# Patient Record
Sex: Female | Born: 1955 | Race: White | Hispanic: No | Smoking: Former smoker
Health system: Southern US, Community
[De-identification: ages and names within clinical notes are randomized; demographics above are authoritative.]

## PROBLEM LIST (undated history)

## (undated) ENCOUNTER — Emergency Department: Source: Home / Self Care

## (undated) DIAGNOSIS — E039 Hypothyroidism, unspecified: Secondary | ICD-10-CM

## (undated) DIAGNOSIS — I82409 Acute embolism and thrombosis of unspecified deep veins of unspecified lower extremity: Secondary | ICD-10-CM

## (undated) DIAGNOSIS — F329 Major depressive disorder, single episode, unspecified: Secondary | ICD-10-CM

## (undated) DIAGNOSIS — E0500 Thyrotoxicosis with diffuse goiter without thyrotoxic crisis or storm: Secondary | ICD-10-CM

## (undated) DIAGNOSIS — M810 Age-related osteoporosis without current pathological fracture: Secondary | ICD-10-CM

## (undated) DIAGNOSIS — F32A Depression, unspecified: Secondary | ICD-10-CM

## (undated) DIAGNOSIS — K219 Gastro-esophageal reflux disease without esophagitis: Secondary | ICD-10-CM

## (undated) DIAGNOSIS — M797 Fibromyalgia: Secondary | ICD-10-CM

## (undated) DIAGNOSIS — M5136 Other intervertebral disc degeneration, lumbar region: Secondary | ICD-10-CM

## (undated) DIAGNOSIS — M51369 Other intervertebral disc degeneration, lumbar region without mention of lumbar back pain or lower extremity pain: Secondary | ICD-10-CM

## (undated) DIAGNOSIS — J449 Chronic obstructive pulmonary disease, unspecified: Secondary | ICD-10-CM

## (undated) DIAGNOSIS — E785 Hyperlipidemia, unspecified: Secondary | ICD-10-CM

## (undated) DIAGNOSIS — F0390 Unspecified dementia without behavioral disturbance: Secondary | ICD-10-CM

## (undated) DIAGNOSIS — F419 Anxiety disorder, unspecified: Secondary | ICD-10-CM

## (undated) DIAGNOSIS — M329 Systemic lupus erythematosus, unspecified: Secondary | ICD-10-CM

## (undated) DIAGNOSIS — I10 Essential (primary) hypertension: Secondary | ICD-10-CM

## (undated) DIAGNOSIS — M359 Systemic involvement of connective tissue, unspecified: Secondary | ICD-10-CM

## (undated) DIAGNOSIS — M1990 Unspecified osteoarthritis, unspecified site: Secondary | ICD-10-CM

## (undated) DIAGNOSIS — IMO0002 Reserved for concepts with insufficient information to code with codable children: Secondary | ICD-10-CM

## (undated) DIAGNOSIS — E119 Type 2 diabetes mellitus without complications: Secondary | ICD-10-CM

## (undated) HISTORY — DX: Unspecified osteoarthritis, unspecified site: M19.90

## (undated) HISTORY — DX: Type 2 diabetes mellitus without complications: E11.9

## (undated) HISTORY — DX: Age-related osteoporosis without current pathological fracture: M81.0

## (undated) HISTORY — PX: ABDOMINAL HYSTERECTOMY: SHX81

## (undated) HISTORY — DX: Fibromyalgia: M79.7

## (undated) HISTORY — DX: Major depressive disorder, single episode, unspecified: F32.9

## (undated) HISTORY — DX: Chronic obstructive pulmonary disease, unspecified: J44.9

## (undated) HISTORY — DX: Essential (primary) hypertension: I10

## (undated) HISTORY — PX: EYE SURGERY: SHX253

## (undated) HISTORY — PX: OOPHORECTOMY: SHX86

## (undated) HISTORY — DX: Reserved for concepts with insufficient information to code with codable children: IMO0002

## (undated) HISTORY — DX: Depression, unspecified: F32.A

## (undated) HISTORY — DX: Systemic lupus erythematosus, unspecified: M32.9

---

## 2005-02-13 ENCOUNTER — Emergency Department: Payer: Self-pay | Admitting: Emergency Medicine

## 2007-07-21 ENCOUNTER — Ambulatory Visit: Payer: Self-pay

## 2007-07-26 ENCOUNTER — Emergency Department: Payer: Self-pay | Admitting: Emergency Medicine

## 2008-11-22 ENCOUNTER — Ambulatory Visit: Payer: Self-pay

## 2011-03-18 ENCOUNTER — Emergency Department: Payer: Self-pay | Admitting: Emergency Medicine

## 2015-01-15 ENCOUNTER — Ambulatory Visit: Payer: Self-pay | Admitting: Family Medicine

## 2015-01-25 DIAGNOSIS — R42 Dizziness and giddiness: Secondary | ICD-10-CM | POA: Insufficient documentation

## 2015-01-25 DIAGNOSIS — E119 Type 2 diabetes mellitus without complications: Secondary | ICD-10-CM | POA: Insufficient documentation

## 2015-01-25 DIAGNOSIS — Z86718 Personal history of other venous thrombosis and embolism: Secondary | ICD-10-CM | POA: Insufficient documentation

## 2015-01-31 DIAGNOSIS — Z7901 Long term (current) use of anticoagulants: Secondary | ICD-10-CM | POA: Insufficient documentation

## 2016-01-31 ENCOUNTER — Ambulatory Visit: Admit: 2016-01-31 | Payer: Self-pay | Admitting: Gastroenterology

## 2016-01-31 SURGERY — COLONOSCOPY WITH PROPOFOL
Anesthesia: General

## 2016-06-12 ENCOUNTER — Other Ambulatory Visit: Payer: Self-pay | Admitting: Family Medicine

## 2016-06-12 DIAGNOSIS — Z1231 Encounter for screening mammogram for malignant neoplasm of breast: Secondary | ICD-10-CM

## 2016-07-25 ENCOUNTER — Encounter (HOSPITAL_COMMUNITY): Payer: Self-pay

## 2016-07-25 ENCOUNTER — Ambulatory Visit
Admission: RE | Admit: 2016-07-25 | Discharge: 2016-07-25 | Disposition: A | Discharge status: Home / Self Care | Payer: Medicaid Other | Source: Ambulatory Visit | Attending: Family Medicine | Admitting: Family Medicine

## 2016-07-25 DIAGNOSIS — Z1231 Encounter for screening mammogram for malignant neoplasm of breast: Secondary | ICD-10-CM | POA: Insufficient documentation

## 2016-12-16 DIAGNOSIS — M159 Polyosteoarthritis, unspecified: Secondary | ICD-10-CM | POA: Insufficient documentation

## 2016-12-16 DIAGNOSIS — R634 Abnormal weight loss: Secondary | ICD-10-CM | POA: Insufficient documentation

## 2016-12-16 DIAGNOSIS — M5136 Other intervertebral disc degeneration, lumbar region: Secondary | ICD-10-CM | POA: Insufficient documentation

## 2016-12-29 ENCOUNTER — Ambulatory Visit: Payer: Medicaid Other | Attending: Internal Medicine | Admitting: Occupational Therapy

## 2016-12-29 ENCOUNTER — Encounter: Payer: Self-pay | Admitting: Occupational Therapy

## 2016-12-29 DIAGNOSIS — M25642 Stiffness of left hand, not elsewhere classified: Secondary | ICD-10-CM | POA: Diagnosis not present

## 2016-12-29 DIAGNOSIS — M25641 Stiffness of right hand, not elsewhere classified: Secondary | ICD-10-CM | POA: Insufficient documentation

## 2016-12-29 DIAGNOSIS — R208 Other disturbances of skin sensation: Secondary | ICD-10-CM | POA: Diagnosis present

## 2016-12-29 DIAGNOSIS — M6281 Muscle weakness (generalized): Secondary | ICD-10-CM | POA: Diagnosis present

## 2016-12-29 NOTE — Patient Instructions (Signed)
Heat  AROM tendon glides  Opposition AROM  RD of digits  2 x day 10 reps  pain free   Wear compression gloves as needed  Wrist splint for night time on R - and if needed during day for numbness  Ed on joint protection principles, AE  Modifying sleeping position

## 2016-12-29 NOTE — Therapy (Signed)
[facility] [address] [place], [address] Phone: [phone]   Fax:  [phone]  Occupational Therapy Treatment  Patient Details  Name: Alison Lopez MRN: [id number] Date of Birth: October 13, 1956 Referring Provider: Meda Coffee   Encounter Date: 12/29/2016      OT End of Session - 12/29/16 1518    Visit Number 1   Number of Visits 1   Date for OT Re-Evaluation 12/29/16   OT Start Time 1302   OT Stop Time 1400   OT Time Calculation (min) 58 min   Activity Tolerance Patient tolerated treatment well   Behavior During Therapy [facility] for tasks assessed/performed      Past Medical History:  Diagnosis Date  . Arthritis   . COPD (chronic obstructive pulmonary disease) ([place])   . Depression   . Diabetes mellitus without complication ([place])   . Fibromyalgia   . Hypertension   . Lupus     No past surgical history on file.  There were no vitals filed for this visit.      Subjective Assessment - 12/29/16 1305    Subjective  Hands gradually worse , drop objects - achiness in hands , feels numbness and wake up in the am - hands stay cold and  stiffness - hard time cutting food , writing , buttons , and doing things around the house   Patient Stated Goals Want to get the strength , exercises to do at home - do not want my hands getting worse    Currently in Pain? No/denies            [facility] OT Assessment - 12/29/16 0001      Assessment   Diagnosis OA of hands    Referring Provider Meda Coffee    Onset Date 12/16/16     Balance Screen   Has the patient fallen in the past 6 months Yes   How many times? 1   Has the patient had a decrease in activity level because of a fear of falling?  No   Is the patient reluctant to leave their home because of a fear of falling?  No     Home  Environment   Lives With Alone     Prior Function   Vocation On disability   Leisure R hand dominant , likes to read, play with dog  and bird, do own house work and cooking, play games on tablet      Strength   Right Hand Grip (lbs) 20   Right Hand Lateral Pinch 6 lbs   Right Hand 3 Point Pinch 5 lbs   Left Hand Grip (lbs) 15   Left Hand Lateral Pinch 10 lbs   Left Hand 3 Point Pinch 6 lbs     Right Hand AROM   R Little PIP 0-100 90 Degrees     Left Hand AROM   L Little PIP 0-100 90 Degrees       Heat  AROM tendon glides  Opposition AROM  RD of digits  2 x day 10 reps  pain free   Wear compression gloves as needed  Wrist splint for night time on R - and if needed during day for numbness  Ed on joint protection principles, AE  Modifying sleeping position                       OT Education - 12/29/16 1518    Education provided  Yes   Education Details HEP and modifications for tasks and sleeing - joint protection - glove nad splint wearing   Person(s) Educated Patient   Methods Explanation;Demonstration;Tactile cues;Verbal cues;Handout   Comprehension Verbal cues required;Returned demonstration;Verbalized understanding             OT Long Term Goals - 12/29/16 1527      OT LONG TERM GOAL #1   Title Pt to be ind in donning and doffing isotoner gloves, R wrist splint and HEP for ROM and joint protection    Baseline demo and verbalize understanding    Status Achieved               Plan - 12/29/16 1519    Clinical Impression Statement Pt present with bilateral hand stiffness and pain - with some numbness during day with tasks and at night time - pt positive for CT with Phalens test, and report numbness at night time and in 5th digit - changed her sleepoing position to fetal posiion few months ago because of hands staying cold and numb - pt AROM was WNL expect 5th PIP's - also show some UD drift in digits - decrease grip and prehension - pt was fitted with bilateral isotoner gloves , wrist splint for night time on R hand ,  educated on joint protection and modifications for  task and sleeping - pt  will cont with home program at home     OT Frequency One time visit   Plan pt to cont with Home program - medicaid to not authorize treatment sessions    OT Home Exercise Plan see pt instruction   Consulted and Agree with Plan of Care Patient      Patient will benefit from skilled therapeutic intervention in order to improve the following deficits and impairments:     Visit Diagnosis: Stiffness of left hand, not elsewhere classified - Plan: Ot plan of care cert/re-cert  Stiffness of right hand, not elsewhere classified - Plan: Ot plan of care cert/re-cert  Muscle weakness (generalized) - Plan: Ot plan of care cert/re-cert  Other disturbances of skin sensation - Plan: Ot plan of care cert/re-cert    Problem List There are no active problems to display for this patient.   Rosalyn Gess OTR/L,CLT 12/29/2016, 3:40 PM  [facility] [address], [place], [address] Phone: [phone]   Fax:  [phone]  Name: Alison Lopez MRN: [id number] Date of Birth: 02-Mar-1956

## 2017-01-28 DIAGNOSIS — M810 Age-related osteoporosis without current pathological fracture: Secondary | ICD-10-CM | POA: Insufficient documentation

## 2017-01-28 DIAGNOSIS — S32020A Wedge compression fracture of second lumbar vertebra, initial encounter for closed fracture: Secondary | ICD-10-CM | POA: Insufficient documentation

## 2017-01-28 DIAGNOSIS — M25559 Pain in unspecified hip: Secondary | ICD-10-CM | POA: Insufficient documentation

## 2017-01-29 ENCOUNTER — Other Ambulatory Visit: Payer: Self-pay | Admitting: Internal Medicine

## 2017-01-29 DIAGNOSIS — M5136 Other intervertebral disc degeneration, lumbar region: Secondary | ICD-10-CM

## 2017-01-29 DIAGNOSIS — M51369 Other intervertebral disc degeneration, lumbar region without mention of lumbar back pain or lower extremity pain: Secondary | ICD-10-CM

## 2017-02-09 ENCOUNTER — Encounter: Payer: Self-pay | Admitting: Radiology

## 2017-02-09 ENCOUNTER — Ambulatory Visit
Admission: RE | Admit: 2017-02-09 | Discharge: 2017-02-09 | Disposition: A | Discharge status: Home / Self Care | Payer: Medicaid Other | Source: Ambulatory Visit | Attending: Internal Medicine | Admitting: Internal Medicine

## 2017-02-09 DIAGNOSIS — M5126 Other intervertebral disc displacement, lumbar region: Secondary | ICD-10-CM | POA: Insufficient documentation

## 2017-02-09 DIAGNOSIS — M5136 Other intervertebral disc degeneration, lumbar region: Secondary | ICD-10-CM | POA: Insufficient documentation

## 2017-02-09 DIAGNOSIS — M48061 Spinal stenosis, lumbar region without neurogenic claudication: Secondary | ICD-10-CM | POA: Diagnosis not present

## 2017-02-09 DIAGNOSIS — R600 Localized edema: Secondary | ICD-10-CM | POA: Insufficient documentation

## 2017-02-09 DIAGNOSIS — M1288 Other specific arthropathies, not elsewhere classified, other specified site: Secondary | ICD-10-CM | POA: Insufficient documentation

## 2017-02-09 DIAGNOSIS — M7138 Other bursal cyst, other site: Secondary | ICD-10-CM | POA: Insufficient documentation

## 2017-08-04 ENCOUNTER — Other Ambulatory Visit: Payer: Self-pay | Admitting: Gastroenterology

## 2017-08-04 DIAGNOSIS — K219 Gastro-esophageal reflux disease without esophagitis: Secondary | ICD-10-CM

## 2017-08-28 ENCOUNTER — Other Ambulatory Visit: Payer: Self-pay | Admitting: Family Medicine

## 2017-08-31 ENCOUNTER — Other Ambulatory Visit: Payer: Self-pay | Admitting: Family Medicine

## 2017-08-31 DIAGNOSIS — Z1231 Encounter for screening mammogram for malignant neoplasm of breast: Secondary | ICD-10-CM

## 2018-06-29 ENCOUNTER — Other Ambulatory Visit: Payer: Self-pay | Admitting: Family Medicine

## 2018-06-29 DIAGNOSIS — Z1231 Encounter for screening mammogram for malignant neoplasm of breast: Secondary | ICD-10-CM

## 2018-07-09 ENCOUNTER — Other Ambulatory Visit: Payer: Self-pay | Admitting: Specialist

## 2018-07-09 DIAGNOSIS — J849 Interstitial pulmonary disease, unspecified: Secondary | ICD-10-CM

## 2018-07-13 ENCOUNTER — Ambulatory Visit
Admission: RE | Admit: 2018-07-13 | Discharge: 2018-07-13 | Disposition: A | Discharge status: Home / Self Care | Payer: Medicaid Other | Source: Ambulatory Visit | Attending: Family Medicine | Admitting: Family Medicine

## 2018-07-13 DIAGNOSIS — Z1231 Encounter for screening mammogram for malignant neoplasm of breast: Secondary | ICD-10-CM | POA: Insufficient documentation

## 2018-07-14 ENCOUNTER — Other Ambulatory Visit: Payer: Self-pay | Admitting: Family Medicine

## 2018-07-14 DIAGNOSIS — N6452 Nipple discharge: Secondary | ICD-10-CM

## 2018-08-02 ENCOUNTER — Ambulatory Visit: Payer: Medicaid Other

## 2018-08-04 ENCOUNTER — Ambulatory Visit
Admission: RE | Admit: 2018-08-04 | Discharge: 2018-08-04 | Disposition: A | Discharge status: Home / Self Care | Payer: Medicaid Other | Source: Ambulatory Visit | Attending: Family Medicine | Admitting: Family Medicine

## 2018-08-04 DIAGNOSIS — N6452 Nipple discharge: Secondary | ICD-10-CM

## 2019-03-21 DIAGNOSIS — M79604 Pain in right leg: Secondary | ICD-10-CM | POA: Insufficient documentation

## 2019-04-21 ENCOUNTER — Emergency Department: Payer: Medicaid Other

## 2019-04-21 ENCOUNTER — Inpatient Hospital Stay
Admission: EM | Admit: 2019-04-21 | Discharge: 2019-04-25 | DRG: 271 | Disposition: A | Discharge status: Home / Self Care | Payer: Medicaid Other | Attending: Internal Medicine | Admitting: Internal Medicine

## 2019-04-21 ENCOUNTER — Other Ambulatory Visit: Payer: Self-pay

## 2019-04-21 ENCOUNTER — Encounter: Payer: Self-pay | Admitting: *Deleted

## 2019-04-21 DIAGNOSIS — Z90710 Acquired absence of both cervix and uterus: Secondary | ICD-10-CM

## 2019-04-21 DIAGNOSIS — I745 Embolism and thrombosis of iliac artery: Secondary | ICD-10-CM | POA: Diagnosis present

## 2019-04-21 DIAGNOSIS — M797 Fibromyalgia: Secondary | ICD-10-CM | POA: Diagnosis present

## 2019-04-21 DIAGNOSIS — D62 Acute posthemorrhagic anemia: Secondary | ICD-10-CM | POA: Diagnosis not present

## 2019-04-21 DIAGNOSIS — J449 Chronic obstructive pulmonary disease, unspecified: Secondary | ICD-10-CM | POA: Diagnosis present

## 2019-04-21 DIAGNOSIS — I82409 Acute embolism and thrombosis of unspecified deep veins of unspecified lower extremity: Secondary | ICD-10-CM | POA: Diagnosis present

## 2019-04-21 DIAGNOSIS — Z7951 Long term (current) use of inhaled steroids: Secondary | ICD-10-CM | POA: Diagnosis not present

## 2019-04-21 DIAGNOSIS — I82491 Acute embolism and thrombosis of other specified deep vein of right lower extremity: Secondary | ICD-10-CM | POA: Diagnosis not present

## 2019-04-21 DIAGNOSIS — I82401 Acute embolism and thrombosis of unspecified deep veins of right lower extremity: Secondary | ICD-10-CM

## 2019-04-21 DIAGNOSIS — Z7984 Long term (current) use of oral hypoglycemic drugs: Secondary | ICD-10-CM

## 2019-04-21 DIAGNOSIS — I82431 Acute embolism and thrombosis of right popliteal vein: Secondary | ICD-10-CM | POA: Diagnosis present

## 2019-04-21 DIAGNOSIS — I82411 Acute embolism and thrombosis of right femoral vein: Secondary | ICD-10-CM | POA: Diagnosis present

## 2019-04-21 DIAGNOSIS — E039 Hypothyroidism, unspecified: Secondary | ICD-10-CM | POA: Diagnosis present

## 2019-04-21 DIAGNOSIS — E1151 Type 2 diabetes mellitus with diabetic peripheral angiopathy without gangrene: Secondary | ICD-10-CM | POA: Diagnosis present

## 2019-04-21 DIAGNOSIS — Z87891 Personal history of nicotine dependence: Secondary | ICD-10-CM

## 2019-04-21 DIAGNOSIS — Z1159 Encounter for screening for other viral diseases: Secondary | ICD-10-CM | POA: Diagnosis not present

## 2019-04-21 DIAGNOSIS — D638 Anemia in other chronic diseases classified elsewhere: Secondary | ICD-10-CM | POA: Diagnosis present

## 2019-04-21 DIAGNOSIS — Z86718 Personal history of other venous thrombosis and embolism: Secondary | ICD-10-CM

## 2019-04-21 DIAGNOSIS — I10 Essential (primary) hypertension: Secondary | ICD-10-CM | POA: Diagnosis present

## 2019-04-21 DIAGNOSIS — Z79890 Hormone replacement therapy (postmenopausal): Secondary | ICD-10-CM | POA: Diagnosis not present

## 2019-04-21 DIAGNOSIS — E785 Hyperlipidemia, unspecified: Secondary | ICD-10-CM | POA: Diagnosis present

## 2019-04-21 DIAGNOSIS — I82421 Acute embolism and thrombosis of right iliac vein: Secondary | ICD-10-CM | POA: Diagnosis not present

## 2019-04-21 DIAGNOSIS — Z79899 Other long term (current) drug therapy: Secondary | ICD-10-CM

## 2019-04-21 DIAGNOSIS — Z7901 Long term (current) use of anticoagulants: Secondary | ICD-10-CM

## 2019-04-21 DIAGNOSIS — E119 Type 2 diabetes mellitus without complications: Secondary | ICD-10-CM | POA: Diagnosis not present

## 2019-04-21 DIAGNOSIS — M79661 Pain in right lower leg: Secondary | ICD-10-CM | POA: Diagnosis present

## 2019-04-21 HISTORY — DX: Acute embolism and thrombosis of unspecified deep veins of unspecified lower extremity: I82.409

## 2019-04-21 LAB — BASIC METABOLIC PANEL
Anion gap: 10 (ref 5–15)
BUN: 15 mg/dL (ref 8–23)
CO2: 20 mmol/L — ABNORMAL LOW (ref 22–32)
Calcium: 8.7 mg/dL — ABNORMAL LOW (ref 8.9–10.3)
Chloride: 105 mmol/L (ref 98–111)
Creatinine, Ser: 1.06 mg/dL — ABNORMAL HIGH (ref 0.44–1.00)
GFR calc Af Amer: >60 mL/min (ref >60)
GFR calc non Af Amer: 55 mL/min — ABNORMAL LOW (ref >60)
Glucose, Bld: 87 mg/dL (ref 70–99)
Potassium: 3.3 mmol/L — ABNORMAL LOW (ref 3.5–5.1)
Sodium: 135 mmol/L (ref 135–145)

## 2019-04-21 LAB — CBC
HCT: 36.3 % (ref 36.0–46.0)
Hemoglobin: 11 g/dL — ABNORMAL LOW (ref 12.0–15.0)
MCH: 24.6 pg — ABNORMAL LOW (ref 26.0–34.0)
MCHC: 30.3 g/dL (ref 30.0–36.0)
MCV: 81 fL (ref 80.0–100.0)
Platelets: 94 10*3/uL — ABNORMAL LOW (ref 150–400)
RBC: 4.48 MIL/uL (ref 3.87–5.11)
RDW: 16.2 % — ABNORMAL HIGH (ref 11.5–15.5)
WBC: 11.3 10*3/uL — ABNORMAL HIGH (ref 4.0–10.5)
nRBC: 0 % (ref 0.0–0.2)

## 2019-04-21 LAB — PROTIME-INR
INR: 2.2 — ABNORMAL HIGH (ref 0.8–1.2)
Prothrombin Time: 24.3 seconds — ABNORMAL HIGH (ref 11.4–15.2)

## 2019-04-21 LAB — SURGICAL PCR SCREEN
MRSA, PCR: NEGATIVE
Staphylococcus aureus: NEGATIVE

## 2019-04-21 LAB — SARS CORONAVIRUS 2 BY RT PCR (HOSPITAL ORDER, PERFORMED IN ~~LOC~~ HOSPITAL LAB): SARS Coronavirus 2: NEGATIVE

## 2019-04-21 LAB — GLUCOSE, CAPILLARY: Glucose-Capillary: 83 mg/dL (ref 70–99)

## 2019-04-21 LAB — APTT: aPTT: 45 seconds — ABNORMAL HIGH (ref 24–36)

## 2019-04-21 MED ORDER — CYCLOBENZAPRINE HCL 10 MG PO TABS: 5.0000 mg | ORAL_TABLET | Freq: Three times a day (TID) | ORAL | Status: DC | PRN

## 2019-04-21 MED ORDER — ONDANSETRON HCL 4 MG/2ML IJ SOLN: 4.0000 mg | Freq: Four times a day (QID) | INTRAMUSCULAR | Status: DC | PRN

## 2019-04-21 MED ORDER — MOMETASONE FURO-FORMOTEROL FUM 200-5 MCG/ACT IN AERO
2.0000 | INHALATION_SPRAY | Freq: Two times a day (BID) | RESPIRATORY_TRACT | Status: DC
  Administered 2019-04-21 – 2019-04-25 (×8): 2 via RESPIRATORY_TRACT
  Filled 2019-04-21: qty 8.8

## 2019-04-21 MED ORDER — SODIUM CHLORIDE 0.9 % IV SOLN: 250.0000 mL | INTRAVENOUS | Status: DC | PRN

## 2019-04-21 MED ORDER — SIMVASTATIN 40 MG PO TABS
40.0000 mg | ORAL_TABLET | Freq: Every day | ORAL | Status: DC
  Administered 2019-04-21 – 2019-04-24 (×4): 40 mg via ORAL
  Filled 2019-04-21 (×5): qty 1

## 2019-04-21 MED ORDER — INSULIN ASPART 100 UNIT/ML ~~LOC~~ SOLN: 0.0000 [IU] | Freq: Every day | SUBCUTANEOUS | Status: DC

## 2019-04-21 MED ORDER — ONDANSETRON HCL 4 MG PO TABS: 4.0000 mg | ORAL_TABLET | Freq: Four times a day (QID) | ORAL | Status: DC | PRN

## 2019-04-21 MED ORDER — HEPARIN BOLUS VIA INFUSION
4000.0000 [IU] | Freq: Once | INTRAVENOUS | Status: AC
  Administered 2019-04-21: 21:00:00 4000 [IU] via INTRAVENOUS
  Filled 2019-04-21: qty 4000

## 2019-04-21 MED ORDER — MUPIROCIN 2 % EX OINT
1.0000 "application " | TOPICAL_OINTMENT | Freq: Two times a day (BID) | CUTANEOUS | Status: DC
  Administered 2019-04-21: 1 via NASAL
  Filled 2019-04-21: qty 22

## 2019-04-21 MED ORDER — HEPARIN (PORCINE) 25000 UT/250ML-% IV SOLN
1000.0000 [IU]/h | INTRAVENOUS | Status: DC
  Administered 2019-04-21 – 2019-04-22 (×2): 1000 [IU]/h via INTRAVENOUS
  Filled 2019-04-21 (×2): qty 250

## 2019-04-21 MED ORDER — HYDRALAZINE HCL 20 MG/ML IJ SOLN: 10.0000 mg | INTRAMUSCULAR | Status: DC | PRN

## 2019-04-21 MED ORDER — HYDROCODONE-ACETAMINOPHEN 5-325 MG PO TABS: 1.0000 | ORAL_TABLET | ORAL | Status: DC | PRN

## 2019-04-21 MED ORDER — LISINOPRIL 20 MG PO TABS
20.0000 mg | ORAL_TABLET | Freq: Every day | ORAL | Status: DC
  Administered 2019-04-22: 20 mg via ORAL
  Filled 2019-04-21: qty 2

## 2019-04-21 MED ORDER — BUPROPION HCL ER (XL) 150 MG PO TB24
150.0000 mg | ORAL_TABLET | Freq: Every day | ORAL | Status: DC
  Administered 2019-04-22 – 2019-04-25 (×4): 150 mg via ORAL
  Filled 2019-04-21 (×4): qty 1

## 2019-04-21 MED ORDER — POLYETHYLENE GLYCOL 3350 17 G PO PACK: 17.0000 g | PACK | Freq: Every day | ORAL | Status: DC | PRN

## 2019-04-21 MED ORDER — FLUTICASONE PROPIONATE 50 MCG/ACT NA SUSP
2.0000 | Freq: Every day | NASAL | Status: DC
  Administered 2019-04-22 – 2019-04-25 (×4): 2 via NASAL
  Filled 2019-04-21: qty 16

## 2019-04-21 MED ORDER — INSULIN ASPART 100 UNIT/ML ~~LOC~~ SOLN: 0.0000 [IU] | Freq: Three times a day (TID) | SUBCUTANEOUS | Status: DC

## 2019-04-21 MED ORDER — SODIUM CHLORIDE 0.9% FLUSH
3.0000 mL | Freq: Two times a day (BID) | INTRAVENOUS | Status: DC
  Administered 2019-04-22 – 2019-04-25 (×6): 3 mL via INTRAVENOUS

## 2019-04-21 MED ORDER — ALBUTEROL SULFATE (2.5 MG/3ML) 0.083% IN NEBU
2.5000 mg | INHALATION_SOLUTION | Freq: Four times a day (QID) | RESPIRATORY_TRACT | Status: DC
  Administered 2019-04-21 – 2019-04-23 (×7): 2.5 mg via RESPIRATORY_TRACT
  Filled 2019-04-21 (×7): qty 3

## 2019-04-21 MED ORDER — ACETAMINOPHEN 325 MG PO TABS: 650.0000 mg | ORAL_TABLET | Freq: Four times a day (QID) | ORAL | Status: DC | PRN

## 2019-04-21 MED ORDER — MONTELUKAST SODIUM 10 MG PO TABS
10.0000 mg | ORAL_TABLET | Freq: Every day | ORAL | Status: DC
  Filled 2019-04-21 (×3): qty 1

## 2019-04-21 MED ORDER — LEVOTHYROXINE SODIUM 88 MCG PO TABS
88.0000 ug | ORAL_TABLET | Freq: Every day | ORAL | Status: DC
  Administered 2019-04-22 – 2019-04-25 (×4): 88 ug via ORAL
  Filled 2019-04-21 (×4): qty 1

## 2019-04-21 MED ORDER — ACETAMINOPHEN 650 MG RE SUPP: 650.0000 mg | Freq: Four times a day (QID) | RECTAL | Status: DC | PRN

## 2019-04-21 MED ORDER — HEPARIN SODIUM (PORCINE) 5000 UNIT/ML IJ SOLN: 60.0000 [IU]/kg | Freq: Once | INTRAMUSCULAR | Status: DC

## 2019-04-21 MED ORDER — SODIUM CHLORIDE 0.9% FLUSH: 3.0000 mL | INTRAVENOUS | Status: DC | PRN

## 2019-04-21 MED ORDER — POTASSIUM CHLORIDE 20 MEQ PO PACK
40.0000 meq | PACK | Freq: Once | ORAL | Status: AC
  Administered 2019-04-21: 40 meq via ORAL
  Filled 2019-04-21: qty 2

## 2019-04-21 MED ORDER — ALBUTEROL SULFATE (2.5 MG/3ML) 0.083% IN NEBU: 2.5000 mg | INHALATION_SOLUTION | Freq: Four times a day (QID) | RESPIRATORY_TRACT | Status: DC | PRN

## 2019-04-21 MED ORDER — PANTOPRAZOLE SODIUM 40 MG PO TBEC
40.0000 mg | DELAYED_RELEASE_TABLET | Freq: Every day | ORAL | Status: DC
  Administered 2019-04-22 – 2019-04-25 (×4): 40 mg via ORAL
  Filled 2019-04-21 (×4): qty 1

## 2019-04-21 NOTE — ED Provider Notes (Signed)
[facility] Emergency Department Provider Note  ____________________________________________   I have reviewed the triage vital signs and the nursing notes.   HISTORY  Chief Complaint Claudication   History limited by: Not Limited   HPI Alison Lopez is a 63 y.o. female who presents to the emergency department today because of concerns for right leg tightness and swelling.  Patient states that she has histories of DVTs in her left leg.  She states starting yesterday she noticed some tightness to her right leg.  She had been having some discomfort in the right leg for a few months.  She has noticed some swelling in that right leg for the past couple of days as well.  She denies any shortness of breath or chest pain.  She is on Coumadin and states she has not missed any doses.  Records reviewed. Per medical record review patient has a history of DVT  Past Medical History:  Diagnosis Date  . Arthritis   . COPD (chronic obstructive pulmonary disease) ([place])   . Depression   . Diabetes mellitus without complication ([place])   . DVT (deep venous thrombosis) ([place])   . Fibromyalgia   . Hypertension   . Lupus ([place])     There are no active problems to display for this patient.   Past Surgical History:  Procedure Laterality Date  . ABDOMINAL HYSTERECTOMY    . OOPHORECTOMY      Prior to Admission medications   Medication Sig Start Date End Date Taking? Authorizing Provider  albuterol (PROVENTIL) (2.5 MG/3ML) 0.083% nebulizer solution Take 2.5 mg by nebulization every 6 (six) hours as needed for wheezing or shortness of breath.    [provider]  buPROPion (WELLBUTRIN XL) 150 MG 24 hr tablet Take 150 mg by mouth daily.    [provider]  cyclobenzaprine (FLEXERIL) 5 MG tablet Take 5 mg by mouth 3 (three) times daily as needed for muscle spasms.    [provider]  esomeprazole (NEXIUM) 40 MG capsule Take 40 mg by mouth daily at 12  noon.    [provider]  fluticasone-salmeterol (ADVAIR HFA) 115-21 MCG/ACT inhaler Inhale 2 puffs into the lungs 2 (two) times daily.    [provider]  levothyroxine (SYNTHROID, LEVOTHROID) 88 MCG tablet Take 88 mcg by mouth daily before breakfast.    [provider]  lisinopril (PRINIVIL,ZESTRIL) 20 MG tablet Take 20 mg by mouth daily.    [provider]  metFORMIN (GLUCOPHAGE) 500 MG tablet Take by mouth 2 (two) times daily with a meal.    [provider]  mometasone (NASONEX) 50 MCG/ACT nasal spray Place 2 sprays into the nose daily.    [provider]  montelukast (SINGULAIR) 10 MG tablet Take 10 mg by mouth at bedtime.    [provider]  simvastatin (ZOCOR) 40 MG tablet Take 40 mg by mouth daily.    [provider]  warfarin (COUMADIN) 3 MG tablet Take 3 mg by mouth daily.    [provider]  warfarin (COUMADIN) 6 MG tablet Take 6 mg by mouth daily.    [provider]    Allergies Morphine and related, Aspirin, Benadryl [diphenhydramine hcl], and Codeine  Family History  Problem Relation Age of Onset  . Breast cancer Neg Hx     Social History Social History   Tobacco Use  . Smoking status: Former Research scientist (life sciences)  . Smokeless tobacco: Never Used  Substance Use Topics  . Alcohol use:  Not Currently  . Drug use: Never    Review of Systems Constitutional: No fever/chills Eyes: No visual changes. ENT: No sore throat. Cardiovascular: Denies chest pain. Respiratory: Denies shortness of breath. Gastrointestinal: No abdominal pain.  No nausea, no vomiting.  No diarrhea.   Genitourinary: Negative for dysuria. Musculoskeletal: Positive for left leg discomfort and swelling.  Skin: Negative for rash. Neurological: Negative for headaches, focal weakness or numbness.  ____________________________________________   PHYSICAL EXAM:  VITAL SIGNS: ED Triage Vitals  Enc Vitals Group     BP 04/21/19  1523 (!) 131/54     Pulse Rate 04/21/19 1523 89     Resp 04/21/19 1523 19     Temp 04/21/19 1523 99.1 F (37.3 C)     Temp Source 04/21/19 1523 Oral     SpO2 04/21/19 1513 98 %     Weight --      Height --      Head Circumference --      Peak Flow --      Pain Score 04/21/19 1524 0   Constitutional: Alert and oriented.  Eyes: Conjunctivae are normal.  ENT      Head: Normocephalic and atraumatic.      Nose: No congestion/rhinnorhea.      Mouth/Throat: Mucous membranes are moist.      Neck: No stridor. Hematological/Lymphatic/Immunilogical: No cervical lymphadenopathy. Cardiovascular: Normal rate, regular rhythm.  No murmurs, rubs, or gallops.  Respiratory: Normal respiratory effort without tachypnea nor retractions. Breath sounds are clear and equal bilaterally. No wheezes/rales/rhonchi. Gastrointestinal: Soft and non tender. No rebound. No guarding.  Genitourinary: Deferred Musculoskeletal: Swelling to right lower leg. Leg feels slightly colder to touch. DP palpable but faint compared to left leg.  Neurologic:  Normal speech and language. No gross focal neurologic deficits are appreciated.  Skin:  Erythema noted to right leg Psychiatric: Mood and affect are normal. Speech and behavior are normal. Patient exhibits appropriate insight and judgment.  ____________________________________________    LABS (pertinent positives/negatives)  INR 2.2 BMP na 135, k 3.3, co2 20, cr 1.06, ca 8.7 CBC wbc 11.3, hgb 11.0, plt 94 ____________________________________________   EKG  None  ____________________________________________    RADIOLOGY  RLE [place] Positive DVT in right calf, popliteal and femoral  ____________________________________________   PROCEDURES  Procedures  ____________________________________________   INITIAL IMPRESSION / ASSESSMENT AND PLAN / ED COURSE  Pertinent labs & imaging results that were available during my care of the patient were reviewed by  me and considered in my medical decision making (see chart for details).   Patient presented to the emergency department today because of concerns for right lower leg tightness and swelling.  On exam patient does have some swelling to that right lower leg.  Ultrasound which was ordered from triage was concerning for large DVT.  Discussed with Dr. Lucky Cowboy with vascular surgery.  Will plan on admission at this time. Discussed findings and plan with patient.   ___________________________________________   FINAL CLINICAL IMPRESSION(S) / ED DIAGNOSES  Final diagnoses:  Acute deep vein thrombosis (DVT) of other specified vein of right lower extremity ([place])     Note: This dictation was prepared with Dragon dictation. Any transcriptional errors that result from this process are unintentional     Nance Pear, MD 04/21/19 2046

## 2019-04-21 NOTE — ED Triage Notes (Signed)
Pt c/o R calf pain starting this morning. Pt has PMH of DVT and is presently anticoagulated w/ coumadin.

## 2019-04-21 NOTE — ED Notes (Signed)
ED TO INPATIENT HANDOFF REPORT  ED Nurse Name and Phone #: Joelene Millin [phone]  S Name/Age/Gender Acey Lav 63 y.o. female Room/Bed: ED26A/ED26A  Code Status   Code Status: Not on file  Home/SNF/Other Home Patient oriented to: self, place, time and situation Is this baseline? Yes   Triage Complete: Triage complete  Chief Complaint Leg pain  Triage Note Pt c/o R calf pain starting this morning. Pt has PMH of DVT and is presently anticoagulated w/ coumadin.    Allergies Allergies  Allergen Reactions  . Morphine And Related Anaphylaxis  . Aspirin   . Benadryl [Diphenhydramine Hcl]   . Codeine Other (See Comments)    AMS    Level of Care/Admitting Diagnosis ED Disposition    ED Disposition Condition [place]: [place] [100120]  Level of Care: Med-Surg [16]  Covid Evaluation: N/A  Diagnosis: DVT (deep venous thrombosis) [facility]) [962229]  Admitting Physician: Gorden Harms [[id number]]  Attending Physician: Gorden Harms [[id number]]  Estimated length of stay: past midnight tomorrow  Certification:: I certify this patient will need inpatient services for at least 2 midnights  PT Class (Do Not Modify): Inpatient [101]  PT Acc Code (Do Not Modify): Private [1]       B Medical/Surgery History Past Medical History:  Diagnosis Date  . Arthritis   . COPD (chronic obstructive pulmonary disease) ([place])   . Depression   . Diabetes mellitus without complication ([place])   . DVT (deep venous thrombosis) ([place])   . Fibromyalgia   . Hypertension   . Lupus [facility])    Past Surgical History:  Procedure Laterality Date  . ABDOMINAL HYSTERECTOMY    . OOPHORECTOMY       A IV Location/Drains/Wounds Patient Lines/Drains/Airways Status   Active Line/Drains/Airways    None          Intake/Output Last 24 hours No intake or output data in the 24 hours ending 04/21/19 2046  Labs/Imaging Results for orders placed or  performed during the hospital encounter of 04/21/19 (from the past 48 hour(s))  CBC     Status: Abnormal   Collection Time: 04/21/19  5:07 PM  Result Value Ref Range   WBC 11.3 (H) 4.0 - 10.5 K/uL   RBC 4.48 3.87 - 5.11 MIL/uL   Hemoglobin 11.0 (L) 12.0 - 15.0 g/dL   HCT 36.3 36.0 - 46.0 %   MCV 81.0 80.0 - 100.0 fL   MCH 24.6 (L) 26.0 - 34.0 pg   MCHC 30.3 30.0 - 36.0 g/dL   RDW 16.2 (H) 11.5 - 15.5 %   Platelets 94 (L) 150 - 400 K/uL    Comment: Immature Platelet Fraction may be clinically indicated, consider ordering this additional test [id number]    nRBC 0.0 0.0 - 0.2 %    Comment: Performed at [facility], [address] [place], [place] [address]  Basic metabolic panel     Status: Abnormal   Collection Time: 04/21/19  5:07 PM  Result Value Ref Range   Sodium 135 135 - 145 mmol/L   Potassium 3.3 (L) 3.5 - 5.1 mmol/L   Chloride 105 98 - 111 mmol/L   CO2 20 (L) 22 - 32 mmol/L   Glucose, Bld 87 70 - 99 mg/dL   BUN 15 8 - 23 mg/dL   Creatinine, Ser 1.06 (H) 0.44 - 1.00 mg/dL   Calcium 8.7 (L) 8.9 - 10.3 mg/dL   GFR calc non Af Wyvonnia Lora  55 (L) >60 mL/min   GFR calc Af Amer >60 >60 mL/min   Anion gap 10 5 - 15    Comment: Performed at [facility], [address] [place], [place] [address]  Protime-INR     Status: Abnormal   Collection Time: 04/21/19  5:11 PM  Result Value Ref Range   Prothrombin Time 24.3 (H) 11.4 - 15.2 seconds   INR 2.2 (H) 0.8 - 1.2    Comment: (NOTE) INR goal varies based on device and disease states. Performed at [facility], [place], [place] [address]    US Venous Img Lower Unilateral Right  Result Date: 04/21/2019 CLINICAL DATA:  Calf pain x1 day EXAM: RIGHT LOWER EXTREMITY VENOUS DOPPLER ULTRASOUND TECHNIQUE: Gray-scale sonography with compression, as well as color and duplex ultrasound, were performed to evaluate the deep venous system from the level of the common femoral vein through the popliteal  and proximal calf veins. COMPARISON:  None FINDINGS: Hypoechoic thrombus in common femoral vein involving saphenofemoral junction, extending into the femoral vein and deep femoral vein with very limited flow on color Doppler. The thrombus extends into the popliteal vein and is seen in posterior and peroneal veins, where it is incompletely occlusive. Survey views of the contralateral common femoral vein are unremarkable. IMPRESSION: 1. POSITIVE for extensive right calf, popliteal and femoral DVT, largely occlusive. Electronically Signed   By: Lucrezia Europe M.D.   On: 04/21/2019 16:52    Pending Labs Unresulted Labs (From admission, onward)    Start     Ordered   04/22/19 0500  Heparin level (unfractionated)  Tomorrow morning,   STAT     04/21/19 2006   04/22/19 0500  CBC  Tomorrow morning,   STAT     04/21/19 2010   04/21/19 2005  APTT  ONCE - STAT,   STAT     04/21/19 2006   04/21/19 1938  SARS Coronavirus 2 (CEPHEID- Performed in [facility] hospital lab), Hosp Order  (Symptomatic Patients Labs with Precautions )  Once,   STAT     04/21/19 1937   Signed and Held  HIV antibody (Routine Testing)  Once,   R     Signed and Held   Signed and Held  CBC  Tomorrow morning,   R     Signed and Held   Signed and Occupational hygienist morning,   R     Signed and Held   Signed and Held  Hemoglobin A1c  Once,   R    Comments: To assess prior glycemic control    Signed and Held   Signed and Held  Magnesium  Tomorrow morning,   R     Signed and Held          Vitals/Pain Today's Vitals   04/21/19 1951 04/21/19 2000 04/21/19 2015 04/21/19 2030  BP:  (!) 142/81  (!) 142/81  Pulse:  83 75 80  Resp:      Temp:      TempSrc:      SpO2:  97% 97% 100%  Weight: 71.2 kg     Height: 5' (1.524 m)     PainSc:        Isolation Precautions Droplet and Contact precautions  Medications Medications  heparin bolus via infusion 4,000 Units (has no administration in time range)  heparin  ADULT infusion 100 units/mL (25000 units/210mL sodium chloride 0.45%) (has no administration in time range)  Mobility walks with device Low fall risk   Focused Assessments DVT   R Recommendations: See Admitting Provider Note  Report given to:   Additional Notes:

## 2019-04-21 NOTE — H&P (Signed)
[place] [facility] [place] NAME: Alison Lopez    MR#:  [id number]  DATE OF BIRTH:  [birth year]  DATE OF ADMISSION:  04/21/2019  PRIMARY CARE PHYSICIAN: Sharyne Peach, MD   REQUESTING/REFERRING PHYSICIAN:   CHIEF COMPLAINT:   Chief Complaint  Patient presents with  . Claudication    HISTORY OF PRESENT ILLNESS: Alison Lopez  is a 63 y.o. female with a known history per below which includes lupus, DVT on Coumadin, presents with worsening right lower extremity swelling/pain, difficulty with ambulation, in the emergency room patient noted to have INR 2.2 potassium 3.3, lower extremity Doppler noted for near occlusive right lower extremity DVT, Dr. dew/vascular surgery contacted-plans for thrombolytics/thrombectomy on tomorrow, patient is now been admitted for acute recurrent DVT which is failed Coumadin treatment.  PAST MEDICAL HISTORY:   Past Medical History:  Diagnosis Date  . Arthritis   . COPD (chronic obstructive pulmonary disease) ([place])   . Depression   . Diabetes mellitus without complication ([place])   . DVT (deep venous thrombosis) ([place])   . Fibromyalgia   . Hypertension   . Lupus ([place])     PAST SURGICAL HISTORY:  Past Surgical History:  Procedure Laterality Date  . ABDOMINAL HYSTERECTOMY    . OOPHORECTOMY      SOCIAL HISTORY:  Social History   Tobacco Use  . Smoking status: Former Research scientist (life sciences)  . Smokeless tobacco: Never Used  Substance Use Topics  . Alcohol use: Not Currently    FAMILY HISTORY:  Family History  Problem Relation Age of Onset  . Breast cancer Neg Hx     DRUG ALLERGIES:  Allergies  Allergen Reactions  . Morphine And Related Anaphylaxis  . Aspirin   . Benadryl [Diphenhydramine Hcl]   . Codeine Other (See Comments)    AMS    REVIEW OF SYSTEMS:   CONSTITUTIONAL: No fever, fatigue or weakness.  EYES: No blurred or double vision.  EARS, NOSE, AND THROAT: No tinnitus or ear pain.  RESPIRATORY: No  cough, shortness of breath, wheezing or hemoptysis.  CARDIOVASCULAR: No chest pain, orthopnea, edema.  GASTROINTESTINAL: No nausea, vomiting, diarrhea or abdominal pain.  GENITOURINARY: No dysuria, hematuria.  ENDOCRINE: No polyuria, nocturia,  HEMATOLOGY: No anemia, easy bruising or bleeding SKIN: No rash or lesion. MUSCULOSKELETAL: right leg pain, edema NEUROLOGIC: No tingling, numbness, weakness.  PSYCHIATRY: No anxiety or depression.   MEDICATIONS AT HOME:  Prior to Admission medications   Medication Sig Start Date End Date Taking? Authorizing Provider  albuterol (PROVENTIL) (2.5 MG/3ML) 0.083% nebulizer solution Take 2.5 mg by nebulization every 6 (six) hours as needed for wheezing or shortness of breath.    [provider]  buPROPion (WELLBUTRIN XL) 150 MG 24 hr tablet Take 150 mg by mouth daily.    [provider]  cyclobenzaprine (FLEXERIL) 5 MG tablet Take 5 mg by mouth 3 (three) times daily as needed for muscle spasms.    [provider]  esomeprazole (NEXIUM) 40 MG capsule Take 40 mg by mouth daily at 12 noon.    [provider]  fluticasone-salmeterol (ADVAIR HFA) 115-21 MCG/ACT inhaler Inhale 2 puffs into the lungs 2 (two) times daily.    [provider]  levothyroxine (SYNTHROID, LEVOTHROID) 88 MCG tablet Take 88 mcg by mouth daily before breakfast.    [provider]  lisinopril (PRINIVIL,ZESTRIL) 20 MG tablet Take 20 mg by mouth daily.    [provider]  metFORMIN (GLUCOPHAGE) 500 MG tablet  Take by mouth 2 (two) times daily with a meal.    [provider]  mometasone (NASONEX) 50 MCG/ACT nasal spray Place 2 sprays into the nose daily.    [provider]  montelukast (SINGULAIR) 10 MG tablet Take 10 mg by mouth at bedtime.    [provider]  simvastatin (ZOCOR) 40 MG tablet Take 40 mg by mouth daily.    [provider]  warfarin (COUMADIN) 3 MG tablet Take 3 mg by mouth daily.     [provider]  warfarin (COUMADIN) 6 MG tablet Take 6 mg by mouth daily.    [provider]      PHYSICAL EXAMINATION:   VITAL SIGNS: Blood pressure 116/71, pulse 75, temperature 99.1 F (37.3 C), temperature source Oral, resp. rate 18, SpO2 98 %.  GENERAL:  63 y.o.-year-old patient lying in the bed with no acute distress.  EYES: Pupils equal, round, reactive to light and accommodation. No scleral icterus. Extraocular muscles intact.  HEENT: Head atraumatic, normocephalic. Oropharynx and nasopharynx clear.  NECK:  Supple, no jugular venous distention. No thyroid enlargement, no tenderness.  LUNGS: Normal breath sounds bilaterally, no wheezing, rales,rhonchi or crepitation. No use of accessory muscles of respiration.  CARDIOVASCULAR: S1, S2 normal. No murmurs, rubs, or gallops.  ABDOMEN: Soft, nontender, nondistended. Bowel sounds present. No organomegaly or mass.  EXTREMITIES: Right leg swelling/tenderness, no cyanosis, or clubbing.  NEUROLOGIC: Cranial nerves II through XII are intact. Muscle strength 5/5 in all extremities. Sensation intact. Gait not checked.  PSYCHIATRIC: The patient is alert and oriented x 3.  SKIN: No obvious rash, lesion, or ulcer.   LABORATORY PANEL:   CBC Recent Labs  Lab 04/21/19 1707  WBC 11.3*  HGB 11.0*  HCT 36.3  PLT 94*  MCV 81.0  MCH 24.6*  MCHC 30.3  RDW 16.2*   ------------------------------------------------------------------------------------------------------------------  Chemistries  Recent Labs  Lab 04/21/19 1707  NA 135  K 3.3*  CL 105  CO2 20*  GLUCOSE 87  BUN 15  CREATININE 1.06*  CALCIUM 8.7*   ------------------------------------------------------------------------------------------------------------------ CrCl cannot be calculated (Unknown ideal weight.). ------------------------------------------------------------------------------------------------------------------ No results for input(s):  TSH, T4TOTAL, T3FREE, THYROIDAB in the last 72 hours.  Invalid input(s): FREET3   Coagulation profile Recent Labs  Lab 04/21/19 1711  INR 2.2*   ------------------------------------------------------------------------------------------------------------------- No results for input(s): DDIMER in the last 72 hours. -------------------------------------------------------------------------------------------------------------------  Cardiac Enzymes No results for input(s): CKMB, TROPONINI, MYOGLOBIN in the last 168 hours.  Invalid input(s): CK ------------------------------------------------------------------------------------------------------------------ Invalid input(s): POCBNP  ---------------------------------------------------------------------------------------------------------------  Urinalysis No results found for: COLORURINE, APPEARANCEUR, LABSPEC, PHURINE, GLUCOSEU, HGBUR, BILIRUBINUR, KETONESUR, PROTEINUR, UROBILINOGEN, NITRITE, LEUKOCYTESUR   RADIOLOGY: US Venous Img Lower Unilateral Right  Result Date: 04/21/2019 CLINICAL DATA:  Calf pain x1 day EXAM: RIGHT LOWER EXTREMITY VENOUS DOPPLER ULTRASOUND TECHNIQUE: Gray-scale sonography with compression, as well as color and duplex ultrasound, were performed to evaluate the deep venous system from the level of the common femoral vein through the popliteal and proximal calf veins. COMPARISON:  None FINDINGS: Hypoechoic thrombus in common femoral vein involving saphenofemoral junction, extending into the femoral vein and deep femoral vein with very limited flow on color Doppler. The thrombus extends into the popliteal vein and is seen in posterior and peroneal veins, where it is incompletely occlusive. Survey views of the contralateral common femoral vein are unremarkable. IMPRESSION: 1. POSITIVE for extensive right calf, popliteal and femoral DVT, largely occlusive. Electronically Signed   By: Lucrezia Europe M.D.   On: 04/21/2019 16:52  EKG: No orders found for this or any previous visit.  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 after midnight, adult pain protocol  *COPD without exacerbation Continue home regiment, breathing treatments PRN  *Chronic diabetes mellitus type 2 Hold metformin, slight scale insulin Accu-Cheks per routine  *Chronic benign essential hypertension Continue home regiment  *Chronic hypothyroidism Continue Synthroid  DVT prophylaxis-on heparin drip Disposition pending clinical course  All the records are reviewed and case discussed with ED provider. Management plans discussed with the patient, family and they are in agreement.  CODE STATUS: Full    TOTAL TIME TAKING CARE OF THIS PATIENT: 40 minutes.    Avel Peace  M.D on 04/21/2019   Between 7am to 6pm - Pager - [phone]  After 6pm go to [email or web address] - password [email or web address] [facility]  Office  [phone]  CC: Primary care physician; Sharyne Peach, MD   Note: This dictation was prepared with Dragon dictation along with smaller phrase technology. Any transcriptional errors that result from this process are unintentional.

## 2019-04-21 NOTE — ED Notes (Signed)
Attempted to call report. Accepting RN to call writer back for report.

## 2019-04-21 NOTE — Consult Note (Signed)
[place] for heparin drip management Indication: DVT  Patient Measurements: Height: 5' (152.4 cm) Weight: 157 lb (71.2 kg) IBW/kg (Calculated) : 45.5 Heparin Dosing Weight: 61 kg  Vital Signs: Temp: 99.1 F (37.3 C) (06/11 1523) Temp Source: Oral (06/11 1523) BP: 116/71 (06/11 1900) Pulse Rate: 75 (06/11 1900)  Labs: Recent Labs    04/21/19 1707 04/21/19 1711  HGB 11.0*  --   HCT 36.3  --   PLT 94*  --   LABPROT  --  24.3*  INR  --  2.2*  CREATININE 1.06*  --     Estimated Creatinine Clearance: 47.2 mL/min (A) (by C-G formula based on SCr of 1.06 mg/dL (H)).   Medical History: Past Medical History:  Diagnosis Date  . Arthritis   . COPD (chronic obstructive pulmonary disease) ([place])   . Depression   . Diabetes mellitus without complication ([place])   . DVT (deep venous thrombosis) ([place])   . Fibromyalgia   . Hypertension   . Lupus ([place])     Medications:  Patient is on warfarin PTA with a therapeutic INR  Assessment: 64 y.o. female with PMH which includes lupus, DVT on Coumadin, presents with worsening right lower extremity swelling/pain, lower extremity Doppler noted for near occlusive right lower extremity DVT, Dr. Dew/vascular plans for thrombolytics/thrombectomy tomorrow, Baseline INR 2.2, H&H, PLT low at baseline: will follow  Goal of Therapy:  Heparin level 0.3-0.7 units/ml Monitor platelets by anticoagulation protocol: Yes   Plan:  Give 4000 units bolus x 1 Start heparin infusion at 1000 units/hr Check anti-Xa level in 6 hours and daily while on heparin Continue to monitor H&H and platelets  Dallie Piles, PharmD 04/21/2019,8:01 PM

## 2019-04-21 NOTE — Progress Notes (Signed)
Family Meeting Note  Advance Directive:yes  Today a meeting took place with the Patient.  Patient is able to participate   The following clinical team members were present during this meeting:MD  The following were discussed:Patient's diagnosis: 63 y.o. female with a known history per below which includes lupus, DVT on Coumadin, presents with worsening right lower extremity swelling/pain, difficulty with ambulation, in the emergency room patient noted to have INR 2.2 potassium 3.3, lower extremity Doppler noted for near occlusive right lower extremity DVT, Dr. dew/vascular surgery contacted-plans for thrombolytics/thrombectomy on tomorrow, patient is now been admitted for acute recurrent DVT which is failed Coumadin treatment, Patient's progosis: Unable to determine and Goals for treatment: Full Code  Additional follow-up to be provided: prn  Time spent during discussion:20 minutes  Gorden Harms, MD

## 2019-04-22 ENCOUNTER — Other Ambulatory Visit (INDEPENDENT_AMBULATORY_CARE_PROVIDER_SITE_OTHER): Payer: Self-pay | Admitting: Vascular Surgery

## 2019-04-22 ENCOUNTER — Encounter
Admission: EM | Disposition: A | Discharge status: Home / Self Care | Payer: Self-pay | Source: Home / Self Care | Attending: Internal Medicine

## 2019-04-22 ENCOUNTER — Encounter: Payer: Self-pay | Admitting: *Deleted

## 2019-04-22 DIAGNOSIS — I82421 Acute embolism and thrombosis of right iliac vein: Secondary | ICD-10-CM

## 2019-04-22 DIAGNOSIS — I82491 Acute embolism and thrombosis of other specified deep vein of right lower extremity: Secondary | ICD-10-CM

## 2019-04-22 DIAGNOSIS — I82411 Acute embolism and thrombosis of right femoral vein: Secondary | ICD-10-CM

## 2019-04-22 DIAGNOSIS — E119 Type 2 diabetes mellitus without complications: Secondary | ICD-10-CM

## 2019-04-22 DIAGNOSIS — I82431 Acute embolism and thrombosis of right popliteal vein: Principal | ICD-10-CM

## 2019-04-22 DIAGNOSIS — I10 Essential (primary) hypertension: Secondary | ICD-10-CM

## 2019-04-22 DIAGNOSIS — Z7901 Long term (current) use of anticoagulants: Secondary | ICD-10-CM

## 2019-04-22 DIAGNOSIS — Z87891 Personal history of nicotine dependence: Secondary | ICD-10-CM

## 2019-04-22 HISTORY — PX: LOWER EXTREMITY INTERVENTION: CATH118252

## 2019-04-22 LAB — GLUCOSE, CAPILLARY
Glucose-Capillary: 82 mg/dL (ref 70–99)
Glucose-Capillary: 86 mg/dL (ref 70–99)
Glucose-Capillary: 77 mg/dL (ref 70–99)
Glucose-Capillary: 96 mg/dL (ref 70–99)
Glucose-Capillary: 98 mg/dL (ref 70–99)
Glucose-Capillary: 103 mg/dL — ABNORMAL HIGH (ref 70–99)

## 2019-04-22 LAB — BASIC METABOLIC PANEL
Anion gap: 9 (ref 5–15)
BUN: 15 mg/dL (ref 8–23)
CO2: 21 mmol/L — ABNORMAL LOW (ref 22–32)
Calcium: 8.9 mg/dL (ref 8.9–10.3)
Chloride: 109 mmol/L (ref 98–111)
Creatinine, Ser: 1.04 mg/dL — ABNORMAL HIGH (ref 0.44–1.00)
GFR calc Af Amer: >60 mL/min (ref >60)
GFR calc non Af Amer: 57 mL/min — ABNORMAL LOW (ref >60)
Glucose, Bld: 83 mg/dL (ref 70–99)
Potassium: 4.4 mmol/L (ref 3.5–5.1)
Sodium: 139 mmol/L (ref 135–145)

## 2019-04-22 LAB — CBC
HCT: 33.4 % — ABNORMAL LOW (ref 36.0–46.0)
Hemoglobin: 10.2 g/dL — ABNORMAL LOW (ref 12.0–15.0)
MCH: 24.8 pg — ABNORMAL LOW (ref 26.0–34.0)
MCHC: 30.5 g/dL (ref 30.0–36.0)
MCV: 81.3 fL (ref 80.0–100.0)
Platelets: 83 10*3/uL — ABNORMAL LOW (ref 150–400)
RBC: 4.11 MIL/uL (ref 3.87–5.11)
RDW: 16.1 % — ABNORMAL HIGH (ref 11.5–15.5)
WBC: 9.2 10*3/uL (ref 4.0–10.5)
nRBC: 0 % (ref 0.0–0.2)

## 2019-04-22 LAB — HEMOGLOBIN A1C
Hgb A1c MFr Bld: 6 % — ABNORMAL HIGH (ref 4.8–5.6)
Mean Plasma Glucose: 125.5 mg/dL

## 2019-04-22 LAB — HEPARIN LEVEL (UNFRACTIONATED)
Heparin Unfractionated: 0.63 IU/mL (ref 0.30–0.70)
Heparin Unfractionated: 0.46 IU/mL (ref 0.30–0.70)

## 2019-04-22 LAB — MAGNESIUM: Magnesium: 2.1 mg/dL (ref 1.7–2.4)

## 2019-04-22 SURGERY — LOWER EXTREMITY INTERVENTION
Anesthesia: Moderate Sedation | Laterality: Right

## 2019-04-22 MED ORDER — ONDANSETRON HCL 4 MG/2ML IJ SOLN: 4.0000 mg | Freq: Four times a day (QID) | INTRAMUSCULAR | Status: DC | PRN

## 2019-04-22 MED ORDER — FENTANYL CITRATE (PF) 100 MCG/2ML IJ SOLN
INTRAMUSCULAR | Status: AC
  Filled 2019-04-22: qty 2

## 2019-04-22 MED ORDER — HYDROMORPHONE HCL 1 MG/ML IJ SOLN: 1.0000 mg | Freq: Once | INTRAMUSCULAR | Status: DC | PRN

## 2019-04-22 MED ORDER — TRAMADOL HCL 50 MG PO TABS: 50.0000 mg | ORAL_TABLET | Freq: Two times a day (BID) | ORAL | Status: DC | PRN

## 2019-04-22 MED ORDER — SODIUM CHLORIDE 0.9 % IV SOLN: INTRAVENOUS | Status: DC

## 2019-04-22 MED ORDER — METHYLPREDNISOLONE SODIUM SUCC 125 MG IJ SOLR: 125.0000 mg | Freq: Once | INTRAMUSCULAR | Status: DC | PRN

## 2019-04-22 MED ORDER — ALTEPLASE 2 MG IJ SOLR
INTRAMUSCULAR | Status: DC | PRN
  Administered 2019-04-22: 8 mg

## 2019-04-22 MED ORDER — MIDAZOLAM HCL 2 MG/2ML IJ SOLN
INTRAMUSCULAR | Status: DC | PRN
  Administered 2019-04-22 (×2): 1 mg via INTRAVENOUS
  Administered 2019-04-22: 2 mg via INTRAVENOUS

## 2019-04-22 MED ORDER — FAMOTIDINE 20 MG PO TABS: 40.0000 mg | ORAL_TABLET | Freq: Once | ORAL | Status: DC | PRN

## 2019-04-22 MED ORDER — SODIUM CHLORIDE 0.9 % IV SOLN
INTRAVENOUS | Status: DC
  Administered 2019-04-22 – 2019-04-23 (×2): via INTRAVENOUS

## 2019-04-22 MED ORDER — LIDOCAINE-EPINEPHRINE (PF) 1 %-1:200000 IJ SOLN
INTRAMUSCULAR | Status: AC
  Administered 2019-04-22: 17:00:00
  Filled 2019-04-22: qty 30

## 2019-04-22 MED ORDER — HEPARIN SODIUM (PORCINE) 1000 UNIT/ML IJ SOLN
INTRAMUSCULAR | Status: AC
  Administered 2019-04-22: 17:00:00
  Filled 2019-04-22: qty 1

## 2019-04-22 MED ORDER — MIDAZOLAM HCL 5 MG/5ML IJ SOLN
INTRAMUSCULAR | Status: AC
  Administered 2019-04-22: 17:00:00
  Filled 2019-04-22: qty 5

## 2019-04-22 MED ORDER — MIDAZOLAM HCL 2 MG/ML PO SYRP: 8.0000 mg | ORAL_SOLUTION | Freq: Once | ORAL | Status: DC | PRN

## 2019-04-22 MED ORDER — CEFAZOLIN SODIUM-DEXTROSE 2-4 GM/100ML-% IV SOLN: 2.0000 g | Freq: Once | INTRAVENOUS | Status: DC

## 2019-04-22 MED ORDER — CEFAZOLIN SODIUM-DEXTROSE 2-4 GM/100ML-% IV SOLN
INTRAVENOUS | Status: AC
  Administered 2019-04-22: 15:00:00
  Filled 2019-04-22: qty 100

## 2019-04-22 SURGICAL SUPPLY — 14 items
BALLN ULTRVRSE 10X60X75 (BALLOONS) ×3
BALLOON ULTRVRSE 10X60X75 (BALLOONS) ×1 IMPLANT
CANISTER PENUMBRA ENGINE (MISCELLANEOUS) ×3 IMPLANT
CANNULA 5F STIFF (CANNULA) ×3 IMPLANT
CATH BEACON 5 .035 65 KMP TIP (CATHETERS) ×3 IMPLANT
CATH INDIGO D 50CM (CATHETERS) ×3 IMPLANT
CATH INDIGO SEP D (CATHETERS) ×3 IMPLANT
DEVICE PRESTO INFLATION (MISCELLANEOUS) ×3 IMPLANT
KIT FEMORAL DEL DENALI (Miscellaneous) ×3 IMPLANT
PACK ANGIOGRAPHY (CUSTOM PROCEDURE TRAY) ×3 IMPLANT
SHEATH 9FRX11 (SHEATH) ×3 IMPLANT
SHEATH BRITE TIP 8FRX11 (SHEATH) ×3 IMPLANT
WIRE J 3MM .035X145CM (WIRE) ×3 IMPLANT
WIRE MAGIC TOR.035 180C (WIRE) ×3 IMPLANT

## 2019-04-22 NOTE — Consult Note (Signed)
[facility] Consult Note  MRN : [id number]  Alison Lopez is a 63 y.o. (07/23/1956) female who presents with chief complaint of  Chief Complaint  Patient presents with  . Claudication   History of Present Illness:  The patient is a 63 year old female with a past medical history of lupus, hypertension, fibromyalgia, diabetes, depression, COPD, arthritis, history of DVT on chronic Coumadin who presented to the [facility] complaining of progressively worsening right calf pain.  The patient endorses a history of progressively worsening pain and swelling to the right lower extremity.  Patient notes her symptoms have worsened over the last 3 to 4 days prompting her to seek medical attention.  She does have a past medical history of DVT for which she is on chronic Coumadin.  INR 2.2. Patient denies any recent surgery or trauma or prolonged illness.  She denies any shortness of breath or chest pain.  Denies any fever, nausea.  04/21/19: Venous duplex: 1. POSITIVE for extensive right calf, popliteal and femoral DVT, largely occlusive.  Vascular surgery was consulted by Dr. Jerelyn Charles for recurrent DVT in the setting of anticoagulation. Current Facility-Administered Medications  Medication Dose Route Frequency Provider Last Rate Last Dose  . 0.9 %  sodium chloride infusion  250 mL Intravenous PRN Salary, Montell D, MD      . 0.9 %  sodium chloride infusion   Intravenous Continuous Chiyo Fay A, PA-C      . acetaminophen (TYLENOL) tablet 650 mg  650 mg Oral Q6H PRN Salary, Montell D, MD       Or  . acetaminophen (TYLENOL) suppository 650 mg  650 mg Rectal Q6H PRN Salary, Montell D, MD      . albuterol (PROVENTIL) (2.5 MG/3ML) 0.083% nebulizer solution 2.5 mg  2.5 mg Nebulization Q6H PRN Salary, Montell D, MD      . albuterol (PROVENTIL) (2.5 MG/3ML) 0.083% nebulizer solution 2.5 mg  2.5 mg Nebulization Q6H Salary, Montell D, MD   2.5 mg  at 04/22/19 0744  . buPROPion (WELLBUTRIN XL) 24 hr tablet 150 mg  150 mg Oral Daily Salary, Montell D, MD   150 mg at 04/22/19 0957  . cyclobenzaprine (FLEXERIL) tablet 5 mg  5 mg Oral TID PRN Salary, Montell D, MD      . fluticasone (FLONASE) 50 MCG/ACT nasal spray 2 spray  2 spray Each Nare Daily Salary, Montell D, MD   2 spray at 04/22/19 0957  . heparin ADULT infusion 100 units/mL (25000 units/225mL sodium chloride 0.45%)  1,000 Units/hr Intravenous Continuous Dallie Piles, RPH 10 mL/hr at 04/22/19 0640 1,000 Units/hr at 04/22/19 0640  . hydrALAZINE (APRESOLINE) injection 10 mg  10 mg Intravenous Q4H PRN Salary, Montell D, MD      . insulin aspart (novoLOG) injection 0-15 Units  0-15 Units Subcutaneous TID WC Salary, Montell D, MD      . insulin aspart (novoLOG) injection 0-5 Units  0-5 Units Subcutaneous QHS Salary, Montell D, MD      . levothyroxine (SYNTHROID) tablet 88 mcg  88 mcg Oral QAC breakfast Loney Hering D, MD   88 mcg at 04/22/19 0637  . lisinopril (ZESTRIL) tablet 20 mg  20 mg Oral Daily Salary, Montell D, MD   20 mg at 04/22/19 0957  . mometasone-formoterol (DULERA) 200-5 MCG/ACT inhaler 2 puff  2 puff Inhalation BID Loney Hering D, MD   2 puff at 04/22/19 0817  . montelukast (SINGULAIR) tablet 10 mg  10 mg Oral QHS Salary, Montell D, MD      . ondansetron (ZOFRAN) tablet 4 mg  4 mg Oral Q6H PRN Salary, Montell D, MD       Or  . ondansetron (ZOFRAN) injection 4 mg  4 mg Intravenous Q6H PRN Salary, Montell D, MD      . pantoprazole (PROTONIX) EC tablet 40 mg  40 mg Oral Daily Salary, Montell D, MD   40 mg at 04/22/19 0957  . polyethylene glycol (MIRALAX / GLYCOLAX) packet 17 g  17 g Oral Daily PRN Salary, Montell D, MD      . simvastatin (ZOCOR) tablet 40 mg  40 mg Oral Daily Salary, Montell D, MD   40 mg at 04/21/19 2331  . sodium chloride flush (NS) 0.9 % injection 3 mL  3 mL Intravenous Q12H Salary, Montell D, MD      . sodium chloride flush (NS) 0.9 % injection 3 mL   3 mL Intravenous PRN Salary, Montell D, MD      . traMADol (ULTRAM) tablet 50 mg  50 mg Oral Q12H PRN Bettey Costa, MD       Past Medical History:  Diagnosis Date  . Arthritis   . COPD (chronic obstructive pulmonary disease) ([place])   . Depression   . Diabetes mellitus without complication ([place])   . DVT (deep venous thrombosis) ([place])   . Fibromyalgia   . Hypertension   . Lupus [facility])    Past Surgical History:  Procedure Laterality Date  . ABDOMINAL HYSTERECTOMY    . OOPHORECTOMY     Social History Social History   Tobacco Use  . Smoking status: Former Research scientist (life sciences)  . Smokeless tobacco: Never Used  Substance Use Topics  . Alcohol use: Not Currently  . Drug use: Never   Family History Family History  Problem Relation Age of Onset  . Breast cancer Neg Hx   Denies family history of peripheral artery disease, venous disease and/or bleeding/clotting disorders.  Allergies  Allergen Reactions  . Morphine And Related Anaphylaxis  . Aspirin Other (See Comments)    Patient on blood thinners and was told not to take  . Benadryl [Diphenhydramine Hcl] Hives  . Codeine Other (See Comments)    AMS   REVIEW OF SYSTEMS (Negative unless checked)  Constitutional: [] Weight loss  [] Fever  [] Chills Cardiac: [] Chest pain   [] Chest pressure   [] Palpitations   [] Shortness of breath when laying flat   [] Shortness of breath at rest   [] Shortness of breath with exertion. Vascular:  [x] Pain in legs with walking   [x] Pain in legs at rest   [x] Pain in legs when laying flat   [] Claudication   [] Pain in feet when walking  [] Pain in feet at rest  [] Pain in feet when laying flat   [x] History of DVT   [] Phlebitis   [x] Swelling in legs   [] Varicose veins   [] Non-healing ulcers Pulmonary:   [] Uses home oxygen   [] Productive cough   [] Hemoptysis   [] Wheeze  [x] COPD   [] Asthma Neurologic:  [] Dizziness  [] Blackouts   [] Seizures   [] History of stroke   [] History of TIA  [] Aphasia   [] Temporary blindness   [] Dysphagia    [] Weakness or numbness in arms   [] Weakness or numbness in legs Musculoskeletal:  [x] Arthritis   [] Joint swelling   [] Joint pain   [] Low back pain Hematologic:  [] Easy bruising  [] Easy bleeding   [] Hypercoagulable state   [] Anemic  [] Hepatitis Gastrointestinal:  [] Blood in stool   []   Vomiting blood  [] Gastroesophageal reflux/heartburn   [] Difficulty swallowing. Genitourinary:  [] Chronic kidney disease   [] Difficult urination  [] Frequent urination  [] Burning with urination   [] Blood in urine Skin:  [] Rashes   [] Ulcers   [] Wounds Psychological:  [] History of anxiety   []  History of major depression.  Physical Examination  Vitals:   04/21/19 2158 04/22/19 0257 04/22/19 0422 04/22/19 0744  BP: 111/69  105/66   Pulse: 81  80   Resp: 18  16   Temp: 97.9 F (36.6 C)  98.6 F (37 C)   TempSrc: Oral  Oral   SpO2: 100% 95% 97% 97%  Weight:      Height:       Body mass index is 30.66 kg/m. Gen:  WD/WN, NAD Head: [place]/AT, No temporalis wasting. Prominent temp pulse not noted. Ear/Nose/Throat: Hearing grossly intact, nares w/o erythema or drainage, oropharynx w/o Erythema/Exudate Eyes: Sclera non-icteric, conjunctiva clear Neck: Trachea midline.  No JVD.  Pulmonary:  Good air movement, respirations not labored, equal bilaterally.  Cardiac: RRR, normal S1, S2. Vascular:  Vessel Right Left  Radial Palpable Palpable  Ulnar Palpable Palpable  Brachial Palpable Palpable  Carotid Palpable, without bruit Palpable, without bruit  Aorta Not palpable N/A  Femoral Palpable Palpable  Popliteal Palpable Palpable  PT Palpable Palpable  DP Palpable Palpable   Right Lower Extremity: Thigh soft.  Calf soft.  Extremities warm distally to toes.  Capillary refill.  Slightly tender to palpation along the calf.  Mild discomfort with dorsiflexion.  Mild to moderate edema.  Skin is intact.  Gastrointestinal: soft, non-tender/non-distended. No guarding/reflex.  Musculoskeletal: M/S 5/5 throughout.   Extremities without ischemic changes.  No deformity or atrophy.  Neurologic: Sensation grossly intact in extremities.  Symmetrical.  Speech is fluent. Motor exam as listed above. Psychiatric: Judgment intact, Mood & affect appropriate for pt's clinical situation. Dermatologic: No rashes or ulcers noted.  No cellulitis or open wounds. Lymph : No Cervical, Axillary, or Inguinal lymphadenopathy.  CBC Lab Results  Component Value Date   WBC 9.2 04/22/2019   HGB 10.2 (L) 04/22/2019   HCT 33.4 (L) 04/22/2019   MCV 81.3 04/22/2019   PLT 83 (L) 04/22/2019   BMET    Component Value Date/Time   NA 139 04/22/2019 0730   K 4.4 04/22/2019 0730   CL 109 04/22/2019 0730   CO2 21 (L) 04/22/2019 0730   GLUCOSE 83 04/22/2019 0730   BUN 15 04/22/2019 0730   CREATININE 1.04 (H) 04/22/2019 0730   CALCIUM 8.9 04/22/2019 0730   GFRNONAA 57 (L) 04/22/2019 0730   GFRAA >60 04/22/2019 0730   Estimated Creatinine Clearance: 48.1 mL/min (A) (by C-G formula based on SCr of 1.04 mg/dL (H)).  COAG Lab Results  Component Value Date   INR 2.2 (H) 04/21/2019   Radiology US Venous Img Lower Unilateral Right  Result Date: 04/21/2019 CLINICAL DATA:  Calf pain x1 day EXAM: RIGHT LOWER EXTREMITY VENOUS DOPPLER ULTRASOUND TECHNIQUE: Gray-scale sonography with compression, as well as color and duplex ultrasound, were performed to evaluate the deep venous system from the level of the common femoral vein through the popliteal and proximal calf veins. COMPARISON:  None FINDINGS: Hypoechoic thrombus in common femoral vein involving saphenofemoral junction, extending into the femoral vein and deep femoral vein with very limited flow on color Doppler. The thrombus extends into the popliteal vein and is seen in posterior and peroneal veins, where it is incompletely occlusive. Survey views of the contralateral common femoral vein are  unremarkable. IMPRESSION: 1. POSITIVE for extensive right calf, popliteal and femoral DVT,  largely occlusive. Electronically Signed   By: Lucrezia Europe M.D.   On: 04/21/2019 16:52   Assessment/Plan The patient is a 63 year old female with a past medical history of lupus, hypertension, fibromyalgia, diabetes, depression, COPD, arthritis, history of DVT on chronic Coumadin who presented to the [facility] complaining of progressively worsening right calf pain found to have an acute right lower extremity DVT in the setting of chronic anticoagulation due to previous DVT 1.  Right lower extremity DVT: Patient presents with an acute extensive right lower extremity DVT in the setting of being on chronic anticoagulation with Coumadin (INR 2.2) due to past DVT.  Would consider the patient failing anticoagulation.  Patient is at an increased risk for pulmonary embolus and therefore recommend IVC filter insertion.  Due to the extensive nature of her right lower extremity DVT, physical exam and symptoms also recommend a right lower extremity venous lysis.  Procedure, risks and benefits explained to the patient.  All questions answered.  The patient wishes to proceed.  Agree with transitioning to Eliquis however her insurance may not cover it.  If this is the case, possibly social work can assist. 2. Diabetes: Encouraged good control as its slows the progression of atherosclerotic disease 3. Hypertension: Encouraged good control as its slows the progression of atherosclerotic disease  Discussed with Dr. Mayme Genta, PA-C  04/22/2019 12:09 PM  This note was created with Dragon medical transcription system.  Any error is purely unintentional.

## 2019-04-22 NOTE — Progress Notes (Signed)
[facility] [place] NAME: Alison Lopez    MR#:  [id number]  DATE OF BIRTH:  September 17, 1956  SUBJECTIVE:   Doing ok this denies SOB  REVIEW OF SYSTEMS:    Review of Systems  Constitutional: Negative for fever, chills weight loss HENT: Negative for ear pain, nosebleeds, congestion, facial swelling, rhinorrhea, neck pain, neck stiffness and ear discharge.   Respiratory: Negative for cough, shortness of breath, wheezing  Cardiovascular: Negative for chest pain, palpitations and ++RLE leg swelling.  Gastrointestinal: Negative for heartburn, abdominal pain, vomiting, diarrhea or consitpation Genitourinary: Negative for dysuria, urgency, frequency, hematuria Musculoskeletal: Negative for back pain or joint pain Neurological: Negative for dizziness, seizures, syncope, focal weakness,  numbness and headaches.  Hematological: Does not bruise/bleed easily.  Psychiatric/Behavioral: Negative for hallucinations, confusion, dysphoric mood    Tolerating Diet: yes      DRUG ALLERGIES:   Allergies  Allergen Reactions  . Morphine And Related Anaphylaxis  . Aspirin Other (See Comments)    Patient on blood thinners and was told not to take  . Benadryl [Diphenhydramine Hcl] Hives  . Codeine Other (See Comments)    AMS    VITALS:  Blood pressure 105/66, pulse 80, temperature 98.6 F (37 C), temperature source Oral, resp. rate 16, height 5' (1.524 m), weight 71.2 kg, SpO2 97 %.  PHYSICAL EXAMINATION:  Constitutional: Appears well-developed and well-nourished. No distress. HENT: Normocephalic. Marland Kitchen Oropharynx is clear and moist.  Eyes: Conjunctivae and EOM are normal. PERRLA, no scleral icterus.  Neck: Normal ROM. Neck supple. No JVD. No tracheal deviation. CVS: RRR, S1/S2 +, no murmurs, no gallops, no carotid bruit.  Pulmonary: Effort and breath sounds normal, no stridor, rhonchi, wheezes, rales.  Abdominal: Soft. BS +,  no distension, tenderness,  rebound or guarding.  Musculoskeletal: Normal range of motion. 1= RLE and mild tenderness.  Neuro: Alert. CN 2-12 grossly intact. No focal deficits. Skin: Skin is warm and dry. No rash noted. Psychiatric: Normal mood and affect.      LABORATORY PANEL:   CBC Recent Labs  Lab 04/22/19 0730  WBC 9.2  HGB 10.2*  HCT 33.4*  PLT 83*   ------------------------------------------------------------------------------------------------------------------  Chemistries  Recent Labs  Lab 04/22/19 0730  NA 139  K 4.4  CL 109  CO2 21*  GLUCOSE 83  BUN 15  CREATININE 1.04*  CALCIUM 8.9  MG 2.1   ------------------------------------------------------------------------------------------------------------------  Cardiac Enzymes No results for input(s): TROPONINI in the last 168 hours. ------------------------------------------------------------------------------------------------------------------  RADIOLOGY:  US Venous Img Lower Unilateral Right  Result Date: 04/21/2019 CLINICAL DATA:  Calf pain x1 day EXAM: RIGHT LOWER EXTREMITY VENOUS DOPPLER ULTRASOUND TECHNIQUE: Gray-scale sonography with compression, as well as color and duplex ultrasound, were performed to evaluate the deep venous system from the level of the common femoral vein through the popliteal and proximal calf veins. COMPARISON:  None FINDINGS: Hypoechoic thrombus in common femoral vein involving saphenofemoral junction, extending into the femoral vein and deep femoral vein with very limited flow on color Doppler. The thrombus extends into the popliteal vein and is seen in posterior and peroneal veins, where it is incompletely occlusive. Survey views of the contralateral common femoral vein are unremarkable. IMPRESSION: 1. POSITIVE for extensive right calf, popliteal and femoral DVT, largely occlusive. Electronically Signed   By: Lucrezia Europe M.D.   On: 04/21/2019 16:52     ASSESSMENT AND PLAN:   63 year old female with  history of diabetes who presents emergency room due to  pain in her right lower extremity.  1. extensive right calf, popliteal and femoral DVT, largely occlusive: Discussed with Dr. dew today.  Plan for thrombectomy. Patient has failed Coumadin numerous times in the past.  Discussed with patient that NOAC may be a better option for her.  She agrees with this. Plan to start Eliquis tomorrow with plan to discharge tomorrow if procedure goes well.  2.  COPD without signs exacerbation: Continue inhalers 3.  Diabetes: Continue sliding-scale ADA diet  3.  Hypothyroidism: Continue Synthroid  4.  Essential hypertension: Continue lisinopril  5.  Hyperlipidemia: Continue statin.     Management plans discussed with the patient and she is in agreement.  CODE STATUS: full  TOTAL TIME TAKING CARE OF THIS PATIENT: 30 minutes.     POSSIBLE D/C tomorrow on eliquis, DEPENDING ON CLINICAL CONDITION.   Bettey Costa M.D on 04/22/2019 at 10:30 AM  Between 7am to 6pm - Pager - [phone] After 6pm go to [email or web address] - password [email or web address] [facility]  Office  [phone]  CC: Primary care physician; Sharyne Peach, MD  Note: This dictation was prepared with Dragon dictation along with smaller phrase technology. Any transcriptional errors that result from this process are unintentional.

## 2019-04-22 NOTE — Consult Note (Signed)
[place] for heparin drip management Indication: DVT  Patient Measurements: Height: 5' (152.4 cm) Weight: 157 lb (71.2 kg) IBW/kg (Calculated) : 45.5 Heparin Dosing Weight: 61 kg  Vital Signs: Temp: 98.4 F (36.9 C) (06/12 1353) Temp Source: Oral (06/12 1427) BP: 114/51 (06/12 1427) Pulse Rate: 100 (06/12 1427)  Labs: Recent Labs    04/21/19 1707 04/21/19 1711 04/21/19 2015 04/22/19 0730 04/22/19 1358  HGB 11.0*  --   --  10.2*  --   HCT 36.3  --   --  33.4*  --   PLT 94*  --   --  83*  --   APTT  --   --  45*  --   --   LABPROT  --  24.3*  --   --   --   INR  --  2.2*  --   --   --   HEPARINUNFRC  --   --   --  0.63 0.46  CREATININE 1.06*  --   --  1.04*  --     Estimated Creatinine Clearance: 48.1 mL/min (A) (by C-G formula based on SCr of 1.04 mg/dL (H)).   Medical History: Past Medical History:  Diagnosis Date  . Arthritis   . COPD (chronic obstructive pulmonary disease) ([place])   . Depression   . Diabetes mellitus without complication ([place])   . DVT (deep venous thrombosis) ([place])   . Fibromyalgia   . Hypertension   . Lupus ([place])     Medications:  Patient is on warfarin PTA with a therapeutic INR  Assessment: 63 y.o. female with PMH which includes lupus, DVT on Coumadin, presents with worsening right lower extremity swelling/pain, lower extremity Doppler noted for near occlusive right lower extremity DVT, Dr. Dew/vascular plans for thrombolytics/thrombectomy tomorrow, Baseline INR 2.2, H&H, PLT low at baseline: will follow  6/12@0730 : HL 0.63 6/12@1358 : HL 0.46  Goal of Therapy:  Heparin level 0.3-0.7 units/ml Monitor platelets by anticoagulation protocol: Yes   Plan:  Current level is therapeutic.  Will continue heparin infusion at current rate of 1000 units/hr This is the second consecutive therapeutic level. Will recheck heparin levels with AM labs Continue to monitor H&H and platelets  Pearla Dubonnet,  PharmD 04/22/2019,2:30 PM

## 2019-04-22 NOTE — Op Note (Signed)
[facility]   OPERATIVE NOTE   PRE-OPERATIVE DIAGNOSIS: extensive recurrent RLE DVT  POST-OPERATIVE DIAGNOSIS: same   PROCEDURE: 1. US guidance for vascular access to right politeal vein 2. Catheter placement into IVC from right popliteal 3. IVC gram and RLE venogram 4. IVC filter placement 5.   Catheter directed thrombolysis with 8 mg of TPA to the right superficial femoral vein, common femoral vein, and distal external iliac vein 6. Mechanical thrombectomy to the right superficial femoral vein, common femoral vein, and distal external iliac vein 7. PTA of the right proximal superficial femoral vein and common femoral vein with 10 mm balloon    SURGEON: Leotis Pain, MD  ASSISTANT(S): none  ANESTHESIA: local with moderate conscious sedation for 40 minutes using 4 mg of Versed  ESTIMATED BLOOD LOSS: 500 cc  FINDING(S): 1. DVT in the proximal right SFV, CFV, and distal right external iliac vein  SPECIMEN(S): none  INDICATIONS:  Patient is a 63 y.o. female who presents with recurrent, highly symptomatic RLE DVT. Patient has marked leg swelling and pain. Venous intervention is performed to reduce the symtpoms and avoid long term postphlebitic symptoms.   DESCRIPTION: After obtaining full informed written consent, the patient was brought back to the vascular suite and placed supine upon the table.Moderate conscious sedation was administered during a face to face encounter with the patient throughout the procedure with my supervision of the RN administering medicines and monitoring the patient's vital signs, pulse oximetry, telemetry and mental status throughout from the start of the procedure until the patient was taken to the recovery room. After obtaining adequate anesthesia, the patient was prepped and draped in the standard fashion.  The patient was then placed into the prone position. The right popliteal vein was then accessed under  direct ultrasound guidance without difficulty with a micropuncture needle and a permanent image was recorded. I then upsized to an 9Fr sheath over a J wire. Imaging showed extensive DVT with minimal flow with a DVT largely in the proximal superficial femoral vein, common femoral vein, and distal external iliac vein. A Kumpe catheter and Magic tourque wire were then advanced into the CFV and images were performed. After the thrombus in the external iliac vein, the common iliac vein and IVC appeared to be patent.  I was able to cross the thrombus and stenosis and advance into the IVC which was patent. I then used the Kumpe catheter and instilled 8 mg of tpa throughout the right superficial femoral vein, common femoral vein, and distal external iliac veins.  I then remove the 9 French sheath and advanced the delivery sheath for the IVC filter into the inferior vena cava.  IVC filter was then deployed at the level of L2.  After the tpa had dwelled, I used the Penumbra Cat D catheter and evacuated about 350 cc of effluent with mechanical thrombectomy throughout the external iliac veins, CFV, and SFV. The catheter was used both over the wire, without a wire, and then with the separator.  This had significant improvement with near complete resolution of the iliac vein and only the common femoral vein and proximal portion of the superficial femoral vein still having flow-limiting thrombus. I then used a 10 mm diameter by 6 cm length angioplasty balloon to treat the proximal superficial femoral vein and common femoral vein with angioplasty.  2 inflations were required and following this there was still some thrombus in the common femoral vein but it was markedly improved with good  forward flow.  An additional pass with the penumbra cat D device was used in the common femoral vein including passes with the separator and about another 150 cc of effluent returned.  Completion imaging showed only a mild amount of residual  thrombus in the common femoral vein with excellent forward flow.  I then elected to terminate the procedure. The sheath was removed and a dressing was placed. She was taken to the recovery room in stable condition having tolerated the procedure well.   COMPLICATIONS: None  CONDITION: Stable  Leotis Pain 04/22/2019 4:07 PM

## 2019-04-22 NOTE — Consult Note (Signed)
[place] for heparin drip management Indication: DVT  Patient Measurements: Height: 5' (152.4 cm) Weight: 157 lb (71.2 kg) IBW/kg (Calculated) : 45.5 Heparin Dosing Weight: 61 kg  Vital Signs: Temp: 98.6 F (37 C) (06/12 0422) Temp Source: Oral (06/12 0422) BP: 105/66 (06/12 0422) Pulse Rate: 80 (06/12 0422)  Labs: Recent Labs    04/21/19 1707 04/21/19 1711 04/21/19 2015 04/22/19 0730  HGB 11.0*  --   --  10.2*  HCT 36.3  --   --  33.4*  PLT 94*  --   --  83*  APTT  --   --  45*  --   LABPROT  --  24.3*  --   --   INR  --  2.2*  --   --   HEPARINUNFRC  --   --   --  0.63  CREATININE 1.06*  --   --   --     Estimated Creatinine Clearance: 47.2 mL/min (A) (by C-G formula based on SCr of 1.06 mg/dL (H)).   Medical History: Past Medical History:  Diagnosis Date  . Arthritis   . COPD (chronic obstructive pulmonary disease) ([place])   . Depression   . Diabetes mellitus without complication ([place])   . DVT (deep venous thrombosis) ([place])   . Fibromyalgia   . Hypertension   . Lupus ([place])     Medications:  Patient is on warfarin PTA with a therapeutic INR  Assessment: 63 y.o. female with PMH which includes lupus, DVT on Coumadin, presents with worsening right lower extremity swelling/pain, lower extremity Doppler noted for near occlusive right lower extremity DVT, Dr. Dew/vascular plans for thrombolytics/thrombectomy tomorrow, Baseline INR 2.2, H&H, PLT low at baseline: will follow  6/12@0730 : HL 0.63  Goal of Therapy:  Heparin level 0.3-0.7 units/ml Monitor platelets by anticoagulation protocol: Yes   Plan:  Current level is therapeutic.  Will continue heparin infusion at current rate of 1000 units/hr Check anti-Xa level in 6 hours and daily while on heparin Continue to monitor H&H and platelets  Pearla Dubonnet, PharmD 04/22/2019,9:02 AM

## 2019-04-23 LAB — HIV ANTIBODY (ROUTINE TESTING W REFLEX): HIV Screen 4th Generation wRfx: NONREACTIVE

## 2019-04-23 LAB — CBC
HCT: 27.6 % — ABNORMAL LOW (ref 36.0–46.0)
Hemoglobin: 8.4 g/dL — ABNORMAL LOW (ref 12.0–15.0)
MCH: 24.8 pg — ABNORMAL LOW (ref 26.0–34.0)
MCHC: 30.4 g/dL (ref 30.0–36.0)
MCV: 81.4 fL (ref 80.0–100.0)
Platelets: 70 10*3/uL — ABNORMAL LOW (ref 150–400)
RBC: 3.39 MIL/uL — ABNORMAL LOW (ref 3.87–5.11)
RDW: 16.3 % — ABNORMAL HIGH (ref 11.5–15.5)
WBC: 9 10*3/uL (ref 4.0–10.5)
nRBC: 0 % (ref 0.0–0.2)

## 2019-04-23 LAB — GLUCOSE, CAPILLARY
Glucose-Capillary: 79 mg/dL (ref 70–99)
Glucose-Capillary: 85 mg/dL (ref 70–99)
Glucose-Capillary: 90 mg/dL (ref 70–99)
Glucose-Capillary: 135 mg/dL — ABNORMAL HIGH (ref 70–99)

## 2019-04-23 LAB — HEPARIN LEVEL (UNFRACTIONATED): Heparin Unfractionated: 0.56 IU/mL (ref 0.30–0.70)

## 2019-04-23 MED ORDER — APIXABAN 5 MG PO TABS
10.0000 mg | ORAL_TABLET | Freq: Two times a day (BID) | ORAL | Status: DC
  Administered 2019-04-23 – 2019-04-25 (×5): 10 mg via ORAL
  Filled 2019-04-23 (×5): qty 2

## 2019-04-23 MED ORDER — APIXABAN 5 MG PO TABS: 5.0000 mg | ORAL_TABLET | Freq: Two times a day (BID) | ORAL | Status: DC

## 2019-04-23 MED ORDER — ALBUTEROL SULFATE (2.5 MG/3ML) 0.083% IN NEBU
2.5000 mg | INHALATION_SOLUTION | Freq: Two times a day (BID) | RESPIRATORY_TRACT | Status: DC
  Administered 2019-04-23: 2.5 mg via RESPIRATORY_TRACT
  Filled 2019-04-23: qty 3

## 2019-04-23 NOTE — Evaluation (Signed)
Physical Therapy Evaluation Patient Details Name: Alison Lopez MRN: [id number] DOB: Aug 19, 1956 Today's Date: 04/23/2019   History of Present Illness  Alison Lopez is a 54yoF who comes to [facility] on 6/11 2/2 worsening RLE swelling and pain, limiting ability to AMB. Pt admitted with progression of RLE DVT doppler revealing "near occlusive" appearance. PMH: Lupu, LLE DVT on coumadin. On 6/12 pt underwent procedure with Dr.Dew Right thigh PTA, RLE IVC filter placement, RLE thrombolysis, and RLE mechanical thrombectomy.  Clinical Impression  Pt admitted with above diagnosis. Pt currently with functional limitations due to the deficits listed below (see "PT Problem List"). Upon entry, pt in chair, awake and agreeable to participate. The pt is alert and oriented x3, pleasant, conversational, and generally a good historian. Pt performing transfers and AMB at supervision to modI level, good confidence, no pain limitations, no instability. Pt is confident and safe with RW for AMB. Pt performs stairs with modified pattern, well established and confidence, with minGuard assist for safety. A BSC would allow patient to remain independent with bathing self with decreased risk of LOB and fall. Patient is at near baseline, all education completed, and time is given to address all questions/concerns. No additional skilled PT services needed at this time, PT signing off. PT recommends daily ambulation ad lib or with nursing staff as needed to prevent deconditioning.       Follow Up Recommendations No PT follow up    Equipment Recommendations  3in1 (PT);Rolling walker with 5" wheels    Recommendations for Other Services       Precautions / Restrictions Precautions Precautions: None      Mobility  Bed Mobility               General bed mobility comments: received up to chair  Transfers Overall transfer level: Modified independent Equipment used: None             General transfer comment:  need for partial UE use to rise to standing, no LOB noted  Ambulation/Gait Ambulation/Gait assistance: Supervision Gait Distance (Feet): 520 Feet Assistive device: Rolling walker (2 wheeled) Gait Pattern/deviations: WFL(Within Functional Limits);Step-through pattern Gait velocity: 0.30m/s.      Stairs Stairs: Yes Stairs assistance: Min guard Stair Management: Two rails;Alternating pattern;Step to pattern;Forwards Number of Stairs: 5    Wheelchair Mobility    Modified Rankin (Stroke Patients Only)       Balance Overall balance assessment: Modified Independent;No apparent balance deficits (not formally assessed)                                           Pertinent Vitals/Pain Pain Assessment: (mild soreness at surgical incision, otherwise tolerated AMB well.)    Home Living Family/patient expects to be discharged to:: Private residence Living Arrangements: Alone Available Help at Discharge: Family Type of Home: House Home Access: Stairs to enter Entrance Stairs-Rails: Right Entrance Stairs-Number of Steps: 5 Home Layout: One level [place]: Grab bars - tub/shower;Cane - single point Additional Comments: fff    Prior Function Level of Independence: Independent with assistive device(s)(had a few falls before christmas, d/t dizziness.)         Comments: mostly household AMB, but garding, mailbox, entry stairs, etc.Had a SPC for intermittent exacerbation with hygrometric changes     Hand Dominance   Dominant Hand: Right    Extremity/Trunk Assessment   Upper Extremity Assessment Upper  Extremity Assessment: Overall WFL for tasks assessed    Lower Extremity Assessment Lower Extremity Assessment: Overall WFL for tasks assessed       Communication   Communication: No difficulties  Cognition Arousal/Alertness: Awake/alert Behavior During Therapy: WFL for tasks assessed/performed Overall Cognitive Status: Within Functional Limits for  tasks assessed                                        General Comments      Exercises     Assessment/Plan    PT Assessment Patent does not need any further PT services  PT Problem List Decreased activity tolerance;Decreased range of motion;Decreased mobility;Decreased knowledge of precautions;Decreased knowledge of use of DME       PT Treatment Interventions Gait training;DME instruction;Functional mobility training;Therapeutic exercise    PT Goals (Current goals can be found in the Care Plan section)  Acute Rehab PT Goals PT Goal Formulation: All assessment and education complete, DC therapy    Frequency     Barriers to discharge        Co-evaluation               AM-PAC PT "6 Clicks" Mobility  Outcome Measure Help needed turning from your back to your side while in a flat bed without using bedrails?: None Help needed moving from lying on your back to sitting on the side of a flat bed without using bedrails?: None Help needed moving to and from a bed to a chair (including a wheelchair)?: None Help needed standing up from a chair using your arms (e.g., wheelchair or bedside chair)?: None Help needed to walk in hospital room?: A Little Help needed climbing 3-5 steps with a railing? : A Little 6 Click Score: 22    End of Session Equipment Utilized During Treatment: Gait belt Activity Tolerance: Patient tolerated treatment well;No increased pain Patient left: in chair Nurse Communication: Mobility status PT Visit Diagnosis: Difficulty in walking, not elsewhere classified (R26.2)    Time: 7416-3845 PT Time Calculation (min) (ACUTE ONLY): 17 min   Charges:   PT Evaluation $PT Eval Low Complexity: 1 Low PT Treatments $Therapeutic Exercise: 8-22 mins        2:28 PM, 04/23/19 Alison Lopez, PT, DPT Physical Therapist - [facility]  [phone] ([place])   Alison Lopez,Alison Lopez 04/23/2019, 2:26 PM

## 2019-04-23 NOTE — Consult Note (Signed)
[place] for Apixaban Indication: DVT  Allergies  Allergen Reactions  . Morphine And Related Anaphylaxis  . Aspirin Other (See Comments)    Patient on blood thinners and was told not to take  . Benadryl [Diphenhydramine Hcl] Hives  . Codeine Other (See Comments)    AMS    Patient Measurements: Height: 5' (152.4 cm) Weight: 157 lb (71.2 kg) IBW/kg (Calculated) : 45.5  Vital Signs: Temp: 99.6 F (37.6 C) (06/13 0609) Temp Source: Oral (06/13 0609) BP: 105/55 (06/13 0609) Pulse Rate: 79 (06/13 0609)  Labs: Recent Labs    04/21/19 1707 04/21/19 1711 04/21/19 2015 04/22/19 0730 04/22/19 1358 04/23/19 0544  HGB 11.0*  --   --  10.2*  --  8.4*  HCT 36.3  --   --  33.4*  --  27.6*  PLT 94*  --   --  83*  --  70*  APTT  --   --  45*  --   --   --   LABPROT  --  24.3*  --   --   --   --   INR  --  2.2*  --   --   --   --   HEPARINUNFRC  --   --   --  0.63 0.46 0.56  CREATININE 1.06*  --   --  1.04*  --   --     Estimated Creatinine Clearance: 48.1 mL/min (A) (by C-G formula based on SCr of 1.04 mg/dL (H)).   Medical History: Past Medical History:  Diagnosis Date  . Arthritis   . COPD (chronic obstructive pulmonary disease) ([place])   . Depression   . Diabetes mellitus without complication ([place])   . DVT (deep venous thrombosis) ([place])   . Fibromyalgia   . Hypertension   . Lupus ([place])     Medications:  Medications Prior to Admission  Medication Sig Dispense Refill Last Dose  . acetaminophen (TYLENOL) 650 MG CR tablet Take 650 mg by mouth every 8 (eight) hours as needed for pain.   prn at prn  . albuterol (PROVENTIL) (2.5 MG/3ML) 0.083% nebulizer solution Take 2.5 mg by nebulization every 4 (four) hours as needed for wheezing or shortness of breath.    prn at prn  . albuterol (VENTOLIN HFA) 108 (90 Base) MCG/ACT inhaler Inhale 2 puffs into the lungs every 4 (four) hours as needed for wheezing or shortness of breath.   prn at prn   . buPROPion (WELLBUTRIN SR) 150 MG 12 hr tablet Take 150 mg by mouth daily.   04/21/2019 at Unknown time  . cyclobenzaprine (FLEXERIL) 5 MG tablet Take 5 mg by mouth 3 (three) times daily as needed for muscle spasms.   prn at prn  . ergocalciferol (VITAMIN D2) 1.25 MG (50000 UT) capsule Take 50,000 Units by mouth every Friday.    Past Week at Unknown time  . erythromycin ophthalmic ointment Place 1 application into both eyes at bedtime.   Past Week at Unknown time  . lansoprazole (PREVACID) 30 MG capsule Take 30 mg by mouth daily.   04/21/2019 at Unknown time  . levothyroxine (SYNTHROID, LEVOTHROID) 88 MCG tablet Take 88 mcg by mouth daily before breakfast.   04/21/2019 at Unknown time  . losartan (COZAAR) 50 MG tablet Take 50 mg by mouth daily.   04/21/2019 at Unknown time  . metFORMIN (GLUCOPHAGE) 500 MG tablet Take 500 mg by mouth daily.    04/21/2019 at Unknown time  .  mometasone (NASONEX) 50 MCG/ACT nasal spray Place 2 sprays into the nose daily.   04/21/2019 at Unknown time  . predniSONE (DELTASONE) 10 MG tablet Take 10 mg by mouth daily with breakfast.   04/21/2019 at Unknown time  . simvastatin (ZOCOR) 40 MG tablet Take 40 mg by mouth daily.   04/20/2019 at Unknown time  . warfarin (COUMADIN) 6 MG tablet Take 6 mg by mouth every evening.    04/20/2019 at 1700   Scheduled:  . albuterol  2.5 mg Nebulization Q6H  . buPROPion  150 mg Oral Daily  . fluticasone  2 spray Each Nare Daily  . insulin aspart  0-15 Units Subcutaneous TID WC  . insulin aspart  0-5 Units Subcutaneous QHS  . levothyroxine  88 mcg Oral QAC breakfast  . mometasone-formoterol  2 puff Inhalation BID  . montelukast  10 mg Oral QHS  . pantoprazole  40 mg Oral Daily  . simvastatin  40 mg Oral Daily  . sodium chloride flush  3 mL Intravenous Q12H   Infusions:  . sodium chloride     PRN: sodium chloride, acetaminophen **OR** acetaminophen, albuterol, cyclobenzaprine, hydrALAZINE, HYDROmorphone (DILAUDID) injection, ondansetron  **OR** ondansetron (ZOFRAN) IV, ondansetron (ZOFRAN) IV, polyethylene glycol, sodium chloride flush, traMADol  Assessment: Patient was on heparin gtt - stopped, transition to apixaban. Hgb trending down and Plt < 100 K. Will need to continue to monitor.   Goal of Therapy:  Monitor platelets by anticoagulation protocol: Yes   Plan:  Will start apixaban 10 mg BID x 7 days followed by 5 mg BID. Will order CBC for tomorrow.   Oswald Hillock, PharmD, BCPS 04/23/2019,9:52 AM

## 2019-04-23 NOTE — Progress Notes (Signed)
Patient complaining of tightness in RLE dressing.  Spoke with Dr. Shelia Media who advised that we could remove dressing.

## 2019-04-23 NOTE — Progress Notes (Signed)
[facility] [place] NAME: Alison Lopez    MR#:  [id number]  DATE OF BIRTH:  [birth year]  SUBJECTIVE:  Patient had some bleeding at the site of procedure from yesterday in the leg Also blood pressure was little low earlier   REVIEW OF SYSTEMS:    Review of Systems  Constitutional: Negative for fever, chills weight loss HENT: Negative for ear pain, nosebleeds, congestion, facial swelling, rhinorrhea, neck pain, neck stiffness and ear discharge.   Respiratory: Negative for cough, shortness of breath, wheezing  Cardiovascular: Negative for chest pain, palpitations and ++RLE leg swelling.  Gastrointestinal: Negative for heartburn, abdominal pain, vomiting, diarrhea or consitpation Genitourinary: Negative for dysuria, urgency, frequency, hematuria Musculoskeletal: Negative for back pain or joint pain Neurological: Negative for dizziness, seizures, syncope, focal weakness,  numbness and headaches.  Hematological: Does not bruise/bleed easily.  Psychiatric/Behavioral: Negative for hallucinations, confusion, dysphoric mood    Tolerating Diet: yes      DRUG ALLERGIES:   Allergies  Allergen Reactions  . Morphine And Related Anaphylaxis  . Aspirin Other (See Comments)    Patient on blood thinners and was told not to take  . Benadryl [Diphenhydramine Hcl] Hives  . Codeine Other (See Comments)    AMS    VITALS:  Blood pressure (!) 107/56, pulse 83, temperature 99 F (37.2 C), temperature source Oral, resp. rate 17, height 5' (1.524 m), weight 71.2 kg, SpO2 98 %.  PHYSICAL EXAMINATION:  Constitutional: Appears well-developed and well-nourished. No distress. HENT: Normocephalic. Marland Kitchen Oropharynx is clear and moist.  Eyes: Conjunctivae and EOM are normal. PERRLA, no scleral icterus.  Neck: Normal ROM. Neck supple. No JVD. No tracheal deviation. CVS: RRR, S1/S2 +, no murmurs, no gallops, no carotid bruit.  Pulmonary: Effort and breath  sounds normal, no stridor, rhonchi, wheezes, rales.  Abdominal: Soft. BS +,  no distension, tenderness, rebound or guarding.  Musculoskeletal: Normal range of motion. 1= RLE and mild tenderness.  Neuro: Alert. CN 2-12 grossly intact. No focal deficits. Skin: Skin is warm and dry. No rash noted. Psychiatric: Normal mood and affect.      LABORATORY PANEL:   CBC Recent Labs  Lab 04/23/19 0544  WBC 9.0  HGB 8.4*  HCT 27.6*  PLT 70*   ------------------------------------------------------------------------------------------------------------------  Chemistries  Recent Labs  Lab 04/22/19 0730  NA 139  K 4.4  CL 109  CO2 21*  GLUCOSE 83  BUN 15  CREATININE 1.04*  CALCIUM 8.9  MG 2.1   ------------------------------------------------------------------------------------------------------------------  Cardiac Enzymes No results for input(s): TROPONINI in the last 168 hours. ------------------------------------------------------------------------------------------------------------------  RADIOLOGY:  US Venous Img Lower Unilateral Right  Result Date: 04/21/2019 CLINICAL DATA:  Calf pain x1 day EXAM: RIGHT LOWER EXTREMITY VENOUS DOPPLER ULTRASOUND TECHNIQUE: Gray-scale sonography with compression, as well as color and duplex ultrasound, were performed to evaluate the deep venous system from the level of the common femoral vein through the popliteal and proximal calf veins. COMPARISON:  None FINDINGS: Hypoechoic thrombus in common femoral vein involving saphenofemoral junction, extending into the femoral vein and deep femoral vein with very limited flow on color Doppler. The thrombus extends into the popliteal vein and is seen in posterior and peroneal veins, where it is incompletely occlusive. Survey views of the contralateral common femoral vein are unremarkable. IMPRESSION: 1. POSITIVE for extensive right calf, popliteal and femoral DVT, largely occlusive. Electronically Signed    By: Lucrezia Europe M.D.   On: 04/21/2019 16:52  ASSESSMENT AND PLAN:   63 year old female with history of diabetes who presents emergency room due to pain in her right lower extremity.  1. extensive right calf, popliteal and femoral DVT, largely occlusive: Status post thrombectomy and IVC filter placed Patient's heparin will be stopped Eliquis will be started Case manager will be consulted to make sure patient will be able to get this medication   2.  COPD without signs exacerbation: Continue inhalers  3.  Diabetes: Continue sliding-scale ADA diet  3.  Hypothyroidism: Continue Synthroid  4.  Essential hypertension: Continue lisinopril  5.  Hyperlipidemia: Continue statin.   6.  Anemia acute blood loss due to surgery will check CBC in the morning   We will plan on probable discharge tomorrow  Management plans discussed with the patient and she is in agreement.  CODE STATUS: full  TOTAL TIME TAKING CARE OF THIS PATIENT: 30 minutes.       Dustin Flock M.D on 04/23/2019 at 1:30 PM  Between 7am to 6pm - Pager - [phone] After 6pm go to [email or web address] - password [email or web address] [facility]  Office  [phone]  CC: Primary care physician; Sharyne Peach, MD  Note: This dictation was prepared with Dragon dictation along with smaller phrase technology. Any transcriptional errors that result from this process are unintentional.

## 2019-04-23 NOTE — Consult Note (Addendum)
[facility] for heparin drip management Indication: DVT  Patient Measurements: Height: 5' (152.4 cm) Weight: 157 lb (71.2 kg) IBW/kg (Calculated) : 45.5 Heparin Dosing Weight: 61 kg  Vital Signs: Temp: 99.6 F (37.6 C) (06/13 0609) Temp Source: Oral (06/13 0609) BP: 105/55 (06/13 0609) Pulse Rate: 79 (06/13 0609)  Labs: Recent Labs    04/21/19 1707 04/21/19 1711 04/21/19 2015 04/22/19 0730 04/22/19 1358 04/23/19 0544  HGB 11.0*  --   --  10.2*  --  8.4*  HCT 36.3  --   --  33.4*  --  27.6*  PLT 94*  --   --  83*  --  70*  APTT  --   --  45*  --   --   --   LABPROT  --  24.3*  --   --   --   --   INR  --  2.2*  --   --   --   --   HEPARINUNFRC  --   --   --  0.63 0.46 0.56  CREATININE 1.06*  --   --  1.04*  --   --     Estimated Creatinine Clearance: 48.1 mL/min (A) (by C-G formula based on SCr of 1.04 mg/dL (H)).   Medical History: Past Medical History:  Diagnosis Date  . Arthritis   . COPD (chronic obstructive pulmonary disease) ([place])   . Depression   . Diabetes mellitus without complication ([place])   . DVT (deep venous thrombosis) ([place])   . Fibromyalgia   . Hypertension   . Lupus ([place])     Medications:  Patient is on warfarin PTA with a therapeutic INR  Assessment: 63 y.o. female with PMH which includes lupus, DVT on Coumadin, presents with worsening right lower extremity swelling/pain, lower extremity Doppler noted for near occlusive right lower extremity DVT, Dr. Dew/vascular plans for thrombolytics/thrombectomy tomorrow, Baseline INR 2.2, H&H, PLT low at baseline: will follow  6/12@0730 : HL 0.63 6/12@1358 : HL 0.46 6/13 @ 0544 HL 0.56  Goal of Therapy:  Heparin level 0.3-0.7 units/ml Monitor platelets by anticoagulation protocol: Yes   Plan:  6/13 @ 0544 HL 0.56. Level remains therapeutic.  Hgb: 11>> 10.2>> 8.4 PLts: 94>> 83>> 70 Hgb and Plts trending down. Continue to monitor. Will continue heparin infusion  at current rate of 1000 units/hr Will recheck heparin level and CBC with AM labs Continue to monitor H&H and platelets  Pernell Dupre, PharmD, BCPS Clinical Pharmacist 04/23/2019 6:15 AM

## 2019-04-24 LAB — GLUCOSE, CAPILLARY
Glucose-Capillary: 89 mg/dL (ref 70–99)
Glucose-Capillary: 95 mg/dL (ref 70–99)
Glucose-Capillary: 153 mg/dL — ABNORMAL HIGH (ref 70–99)
Glucose-Capillary: 91 mg/dL (ref 70–99)

## 2019-04-24 LAB — CBC
HCT: 26.1 % — ABNORMAL LOW (ref 36.0–46.0)
Hemoglobin: 7.7 g/dL — ABNORMAL LOW (ref 12.0–15.0)
MCH: 24.8 pg — ABNORMAL LOW (ref 26.0–34.0)
MCHC: 29.5 g/dL — ABNORMAL LOW (ref 30.0–36.0)
MCV: 83.9 fL (ref 80.0–100.0)
Platelets: 73 10*3/uL — ABNORMAL LOW (ref 150–400)
RBC: 3.11 MIL/uL — ABNORMAL LOW (ref 3.87–5.11)
RDW: 16.2 % — ABNORMAL HIGH (ref 11.5–15.5)
WBC: 8.1 10*3/uL (ref 4.0–10.5)
nRBC: 0 % (ref 0.0–0.2)

## 2019-04-24 LAB — RETICULOCYTES
Immature Retic Fract: 23.4 % — ABNORMAL HIGH (ref 2.3–15.9)
RBC.: 3.43 MIL/uL — ABNORMAL LOW (ref 3.87–5.11)
Retic Count, Absolute: 62.4 10*3/uL (ref 19.0–186.0)
Retic Ct Pct: 1.8 % (ref 0.4–3.1)

## 2019-04-24 LAB — FERRITIN: Ferritin: 17 ng/mL (ref 11–307)

## 2019-04-24 LAB — IRON AND TIBC
Iron: 33 ug/dL (ref 28–170)
Saturation Ratios: 10 % — ABNORMAL LOW (ref 10.4–31.8)
TIBC: 341 ug/dL (ref 250–450)
UIBC: 308 ug/dL

## 2019-04-24 LAB — FOLATE: Folate: 35 ng/mL (ref >5.9)

## 2019-04-24 MED ORDER — SODIUM CHLORIDE 0.9 % IV SOLN
300.0000 mg | Freq: Once | INTRAVENOUS | Status: AC
  Administered 2019-04-24: 13:00:00 300 mg via INTRAVENOUS
  Filled 2019-04-24: qty 15

## 2019-04-24 NOTE — Progress Notes (Signed)
[facility] at [place] NAME: Alison Lopez    MR#:  [id number]  DATE OF BIRTH:  Jan 06, 1956  SUBJECTIVE:   Patient hemoglobin is dropped she denies any complaints   REVIEW OF SYSTEMS:    Review of Systems  Constitutional: Negative for fever, chills weight loss HENT: Negative for ear pain, nosebleeds, congestion, facial swelling, rhinorrhea, neck pain, neck stiffness and ear discharge.   Respiratory: Negative for cough, shortness of breath, wheezing  Cardiovascular: Negative for chest pain, palpitations and ++RLE leg swelling.  Gastrointestinal: Negative for heartburn, abdominal pain, vomiting, diarrhea or consitpation Genitourinary: Negative for dysuria, urgency, frequency, hematuria Musculoskeletal: Negative for back pain or joint pain Neurological: Negative for dizziness, seizures, syncope, focal weakness,  numbness and headaches.  Hematological: Does not bruise/bleed easily.  Psychiatric/Behavioral: Negative for hallucinations, confusion, dysphoric mood    Tolerating Diet: yes      DRUG ALLERGIES:   Allergies  Allergen Reactions  . Morphine And Related Anaphylaxis  . Aspirin Other (See Comments)    Patient on blood thinners and was told not to take  . Benadryl [Diphenhydramine Hcl] Hives  . Codeine Other (See Comments)    AMS    VITALS:  Blood pressure (!) 112/49, pulse 81, temperature 98.4 F (36.9 C), temperature source Oral, resp. rate 17, height 5' (1.524 m), weight 71.2 kg, SpO2 100 %.  PHYSICAL EXAMINATION:  Constitutional: Appears well-developed and well-nourished. No distress. HENT: Normocephalic. Marland Kitchen Oropharynx is clear and moist.  Eyes: Conjunctivae and EOM are normal. PERRLA, no scleral icterus.  Neck: Normal ROM. Neck supple. No JVD. No tracheal deviation. CVS: RRR, S1/S2 +, no murmurs, no gallops, no carotid bruit.  Pulmonary: Effort and breath sounds normal, no stridor, rhonchi, wheezes, rales.  Abdominal:  Soft. BS +,  no distension, tenderness, rebound or guarding.  Musculoskeletal: Normal range of motion. 1= RLE and mild tenderness.  Neuro: Alert. CN 2-12 grossly intact. No focal deficits. Skin: Skin is warm and dry. No rash noted. Psychiatric: Normal mood and affect.      LABORATORY PANEL:   CBC Recent Labs  Lab 04/24/19 0533  WBC 8.1  HGB 7.7*  HCT 26.1*  PLT 73*   ------------------------------------------------------------------------------------------------------------------  Chemistries  Recent Labs  Lab 04/22/19 0730  NA 139  K 4.4  CL 109  CO2 21*  GLUCOSE 83  BUN 15  CREATININE 1.04*  CALCIUM 8.9  MG 2.1   ------------------------------------------------------------------------------------------------------------------  Cardiac Enzymes No results for input(s): TROPONINI in the last 168 hours. ------------------------------------------------------------------------------------------------------------------  RADIOLOGY:  US Venous Img Lower Unilateral Right  Result Date: 04/21/2019 CLINICAL DATA:  Calf pain x1 day EXAM: RIGHT LOWER EXTREMITY VENOUS DOPPLER ULTRASOUND TECHNIQUE: Gray-scale sonography with compression, as well as color and duplex ultrasound, were performed to evaluate the deep venous system from the level of the common femoral vein through the popliteal and proximal calf veins. COMPARISON:  None FINDINGS: Hypoechoic thrombus in common femoral vein involving saphenofemoral junction, extending into the femoral vein and deep femoral vein with very limited flow on color Doppler. The thrombus extends into the popliteal vein and is seen in posterior and peroneal veins, where it is incompletely occlusive. Survey views of the contralateral common femoral vein are unremarkable. IMPRESSION: 1. POSITIVE for extensive right calf, popliteal and femoral DVT, largely occlusive. Electronically Signed   By: Lucrezia Europe M.D.   On: 04/21/2019 16:52     ASSESSMENT AND  PLAN:   63 year old female with history of diabetes who  presents emergency room due to pain in her right lower extremity.  1. extensive right calf, popliteal and femoral DVT, largely occlusive: Status post thrombectomy and IVC filter placed Eliquis started Case manager will be consulted to make sure patient will be able to get this medication  2.  Anemia follow CBC I will check anemia panel if hemoglobin stable can be discharged tomorrow   3.  COPD without signs exacerbation: Continue inhalers  4.  Diabetes: Continue sliding-scale ADA diet  5.  Hypothyroidism: Continue Synthroid  6.  Essential hypertension: Continue lisinopril  7.  Hyperlipidemia: Continue statin.      If hgb stable can d/c tomm  Management plans discussed with the patient and she is in agreement.  CODE STATUS: full  TOTAL TIME TAKING CARE OF THIS PATIENT: 30 minutes.       Dustin Flock M.D on 04/24/2019 at 12:06 PM  Between 7am to 6pm - Pager - [phone] After 6pm go to [email or web address] - password [email or web address] [facility]  Office  [phone]  CC: Primary care physician; Sharyne Peach, MD  Note: This dictation was prepared with Dragon dictation along with smaller phrase technology. Any transcriptional errors that result from this process are unintentional.

## 2019-04-24 NOTE — Progress Notes (Signed)
Subjective: Interval History: none.. right leg much smaller.  Objective: Vital signs in last 24 hours: Temp:  [98.6 F (37 C)-99 F (37.2 C)] 98.9 F (37.2 C) (06/14 0441) Pulse Rate:  [55-83] 70 (06/14 0441) Resp:  [16-17] 16 (06/14 0441) BP: (105-113)/(52-71) 105/52 (06/14 0441) SpO2:  [97 %-99 %] 98 % (06/14 0441)  Intake/Output from previous day: 06/13 0701 - 06/14 0700 In: 632.2 [I.V.:632.2] Out: 1800 [Urine:1800] Intake/Output this shift: No intake/output data recorded.  General appearance: alert, cooperative and no distress Extremities: extremities normal, atraumatic, no cyanosis or edema  Lab Results: Recent Labs    04/23/19 0544 04/24/19 0533  WBC 9.0 8.1  HGB 8.4* 7.7*  HCT 27.6* 26.1*  PLT 70* 73*   BMET Recent Labs    04/21/19 1707 04/22/19 0730  NA 135 139  K 3.3* 4.4  CL 105 109  CO2 20* 21*  GLUCOSE 87 83  BUN 15 15  CREATININE 1.06* 1.04*  CALCIUM 8.7* 8.9    Studies/Results: US Venous Img Lower Unilateral Right  Result Date: 04/21/2019 CLINICAL DATA:  Calf pain x1 day EXAM: RIGHT LOWER EXTREMITY VENOUS DOPPLER ULTRASOUND TECHNIQUE: Gray-scale sonography with compression, as well as color and duplex ultrasound, were performed to evaluate the deep venous system from the level of the common femoral vein through the popliteal and proximal calf veins. COMPARISON:  None FINDINGS: Hypoechoic thrombus in common femoral vein involving saphenofemoral junction, extending into the femoral vein and deep femoral vein with very limited flow on color Doppler. The thrombus extends into the popliteal vein and is seen in posterior and peroneal veins, where it is incompletely occlusive. Survey views of the contralateral common femoral vein are unremarkable. IMPRESSION: 1. POSITIVE for extensive right calf, popliteal and femoral DVT, largely occlusive. Electronically Signed   By: Lucrezia Europe M.D.   On: 04/21/2019 16:52   Anti-infectives: Anti-infectives (From  admission, onward)   Start     Dose/Rate Route Frequency Ordered Stop   04/22/19 1430  ceFAZolin (ANCEF) IVPB 2g/100 mL premix  Status:  Discontinued     2 g 200 mL/hr over 30 Minutes Intravenous  Once 04/22/19 1418 04/22/19 1645   04/22/19 1419  ceFAZolin (ANCEF) 2-4 GM/100ML-% IVPB    Note to Pharmacy: Despina Arias  : cabinet override      04/22/19 1419 04/22/19 2149      Assessment/Plan: s/p Procedure(s): IVC Filter Insertion with Right Lower Extremity Venous Lysis (Right) No residual edema, minimal dressing in place, for any oozing that may occur. Hgb stabilizing. On eliquis. Can go from surgical standpoint. Use compression hose at home.    LOS: 3 days   Lisaanne Lawrie A Sayer Masini 04/24/2019, 8:10 AM

## 2019-04-25 ENCOUNTER — Encounter: Payer: Self-pay | Admitting: Vascular Surgery

## 2019-04-25 LAB — CBC
HCT: 25.3 % — ABNORMAL LOW (ref 36.0–46.0)
Hemoglobin: 7.7 g/dL — ABNORMAL LOW (ref 12.0–15.0)
MCH: 24.8 pg — ABNORMAL LOW (ref 26.0–34.0)
MCHC: 30.4 g/dL (ref 30.0–36.0)
MCV: 81.6 fL (ref 80.0–100.0)
Platelets: 72 K/uL — ABNORMAL LOW (ref 150–400)
RBC: 3.1 MIL/uL — ABNORMAL LOW (ref 3.87–5.11)
RDW: 16.3 % — ABNORMAL HIGH (ref 11.5–15.5)
WBC: 8.4 K/uL (ref 4.0–10.5)
nRBC: 0 % (ref 0.0–0.2)

## 2019-04-25 LAB — GLUCOSE, CAPILLARY
Glucose-Capillary: 85 mg/dL (ref 70–99)
Glucose-Capillary: 75 mg/dL (ref 70–99)

## 2019-04-25 LAB — VITAMIN B12: Vitamin B-12: 516 pg/mL (ref 180–914)

## 2019-04-25 MED ORDER — APIXABAN 5 MG PO TABS: 5.0000 mg | ORAL_TABLET | Freq: Two times a day (BID) | ORAL | 2 refills | Status: DC

## 2019-04-25 MED ORDER — APIXABAN 5 MG PO TABS: 10.0000 mg | ORAL_TABLET | Freq: Two times a day (BID) | ORAL | 0 refills | Status: DC

## 2019-04-25 NOTE — TOC Transition Note (Signed)
Transition of Care [facility]) - CM/SW Discharge Note   Patient Details  Name: Alison Lopez MRN: [id number] Date of Birth: [birth year]  Transition of Care [facility] CM/SW Contact:  Ross Ludwig, LCSW Phone Number: 04/25/2019, 12:28 PM   Clinical Narrative:     Patient will be discharging home with a rolling walker and a shower chair CSW contacted Brad at [facility].  Patient will be discharging on Eliquis, patient was given a 30 day free coupon.  CSW explained to her how to use the coupon, and to discuss with her Cardiologist if she wants to continue on the medication.  CSW to facilitate discharge planning.   Final next level of care: Home/Self Care Barriers to Discharge: No Barriers Identified   Patient Goals and CMS Choice Patient states their goals for this hospitalization and ongoing recovery are:: To return back home CMS Medicare.gov Compare Post Acute Care list provided to:: Patient Choice offered to / list presented to : Patient  Discharge Placement     Patient will be discharging back home with walker and shower chair.                  Discharge Plan and Services                DME Arranged: Shower stool, Walker rolling DME Agency: [employer] Date DME Agency Contacted: 04/25/19 Time DME Agency Contacted: [phone] Representative spoke with at DME Agency: [facility] HH Arranged: NA [place] Agency: NA        Social Determinants of Health (SDOH) Interventions     Readmission Risk Interventions No flowsheet data found.

## 2019-04-25 NOTE — Progress Notes (Signed)
Pt discharged per MD order. Discharge instructions reviewed with pt. Pt verbalized understanding. Pt taken downstairs in wheelchair by staff.

## 2019-04-25 NOTE — Discharge Summary (Signed)
[employer] [facility] [facility] [place] at [facility], [place] y.o., DOB [birth year], MRN [id number]. Admission date: 04/21/2019 Discharge Date 04/25/2019 Primary MD Sharyne Peach, MD Admitting Physician Gorden Harms, MD  Admission Diagnosis  Acute deep vein thrombosis (DVT) of other specified vein of right lower extremity [facility]) [I82.491]  Discharge Diagnosis   Active Problems: Extensive right popliteal and femoral DVT status post IV filter placed and thrombectomy Anemia due to acute blood loss and anemia of chronic disease will need follow-up CBC at time of visit to primary care provider Diabetes type 2 Hypothyroid Essential hypertension Hyperlipidemia   Hospital Course  63 year old female with history of diabetes who presents emergency room due to pain in her right lower extremity.  Patient has a history of DVT in the past and is on chronic Coumadin therapy her INR was therapeutic however she still developed a DVT.  Due to this she was admitted to the hospital.  She was treated with IV heparin.  She was seen by vascular team underwent IVC filter due to concern for failure of anticoagulation.  Patient also had thrombectomy in the lower extremity.  Patient is doing much better.  Her hemoglobin did drop after procedure.  Likely due to combination of chronic anemia and acute blood loss anemia.  Patient's hemoglobin is remained stable for the last 2 days.  She also received IV iron.  Need to follow-up CBC on Friday with her primary care provider.  If anemia continues to be a problem she will need to follow-up with hematology as outpatient.   Patient doing much better very anxious to go home today.           Consults  vascular surgery  Significant Tests:  See full reports for all details     US Venous Img Lower Unilateral Right  Result Date: 04/21/2019 CLINICAL DATA:  Calf pain x1 day EXAM: RIGHT LOWER EXTREMITY VENOUS DOPPLER ULTRASOUND TECHNIQUE: Gray-scale  sonography with compression, as well as color and duplex ultrasound, were performed to evaluate the deep venous system from the level of the common femoral vein through the popliteal and proximal calf veins. COMPARISON:  None FINDINGS: Hypoechoic thrombus in common femoral vein involving saphenofemoral junction, extending into the femoral vein and deep femoral vein with very limited flow on color Doppler. The thrombus extends into the popliteal vein and is seen in posterior and peroneal veins, where it is incompletely occlusive. Survey views of the contralateral common femoral vein are unremarkable. IMPRESSION: 1. POSITIVE for extensive right calf, popliteal and femoral DVT, largely occlusive. Electronically Signed   By: Lucrezia Europe M.D.   On: 04/21/2019 16:52       Today   Subjective:   Alison Lopez patient doing well denies any complaints Objective:   Blood pressure (!) 108/58, pulse 79, temperature 98.2 F (36.8 C), temperature source Oral, resp. rate 18, height 5' (1.524 m), weight 71.2 kg, SpO2 100 %.  .  Intake/Output Summary (Last 24 hours) at 04/25/2019 1259 Last data filed at 04/25/2019 0814 Gross per 24 hour  Intake 745.51 ml  Output 1200 ml  Net -454.49 ml    Exam VITAL SIGNS: Blood pressure (!) 108/58, pulse 79, temperature 98.2 F (36.8 C), temperature source Oral, resp. rate 18, height 5' (1.524 m), weight 71.2 kg, SpO2 100 %.  GENERAL:  63 y.o.-year-old patient lying in the bed with no acute distress.  EYES: Pupils equal, round, reactive to light and accommodation. No scleral icterus. Extraocular muscles  intact.  HEENT: Head atraumatic, normocephalic. Oropharynx and nasopharynx clear.  NECK:  Supple, no jugular venous distention. No thyroid enlargement, no tenderness.  LUNGS: Normal breath sounds bilaterally, no wheezing, rales,rhonchi or crepitation. No use of accessory muscles of respiration.  CARDIOVASCULAR: S1, S2 normal. No murmurs, rubs, or gallops.  ABDOMEN:  Soft, nontender, nondistended. Bowel sounds present. No organomegaly or mass.  EXTREMITIES: No pedal edema, cyanosis, or clubbing.  NEUROLOGIC: Cranial nerves II through XII are intact. Muscle strength 5/5 in all extremities. Sensation intact. Gait not checked.  PSYCHIATRIC: The patient is alert and oriented x 3.  SKIN: No obvious rash, lesion, or ulcer.   Data Review     CBC w Diff:  Lab Results  Component Value Date   WBC 8.4 04/25/2019   HGB 7.7 (L) 04/25/2019   HCT 25.3 (L) 04/25/2019   PLT 72 (L) 04/25/2019   CMP:  Lab Results  Component Value Date   NA 139 04/22/2019   K 4.4 04/22/2019   CL 109 04/22/2019   CO2 21 (L) 04/22/2019   BUN 15 04/22/2019   CREATININE 1.04 (H) 04/22/2019  .  Micro Results Recent Results (from the past 240 hour(s))  SARS Coronavirus 2 (CEPHEID- Performed in [place] hospital lab), Hosp Order     Status: None   Collection Time: 04/21/19  8:15 PM   Specimen: Nasopharyngeal Swab  Result Value Ref Range Status   SARS Coronavirus 2 NEGATIVE NEGATIVE Final    Comment: (NOTE) If result is NEGATIVE SARS-CoV-2 target nucleic acids are NOT DETECTED. The SARS-CoV-2 RNA is generally detectable in upper and lower  respiratory specimens during the acute phase of infection. The lowest  concentration of SARS-CoV-2 viral copies this assay can detect is 250  copies / mL. A negative result does not preclude SARS-CoV-2 infection  and should not be used as the sole basis for treatment or other  patient management decisions.  A negative result may occur with  improper specimen collection / handling, submission of specimen other  than nasopharyngeal swab, presence of viral mutation(s) within the  areas targeted by this assay, and inadequate number of viral copies  (<250 copies / mL). A negative result must be combined with clinical  observations, patient history, and epidemiological information. If result is POSITIVE SARS-CoV-2 target nucleic acids are  DETECTED. The SARS-CoV-2 RNA is generally detectable in upper and lower  respiratory specimens dur ing the acute phase of infection.  Positive  results are indicative of active infection with SARS-CoV-2.  Clinical  correlation with patient history and other diagnostic information is  necessary to determine patient infection status.  Positive results do  not rule out bacterial infection or co-infection with other viruses. If result is PRESUMPTIVE POSTIVE SARS-CoV-2 nucleic acids MAY BE PRESENT.   A presumptive positive result was obtained on the submitted specimen  and confirmed on repeat testing.  While 2019 novel coronavirus  (SARS-CoV-2) nucleic acids may be present in the submitted sample  additional confirmatory testing may be necessary for epidemiological  and / or clinical management purposes  to differentiate between  SARS-CoV-2 and other Sarbecovirus currently known to infect humans.  If clinically indicated additional testing with an alternate test  methodology [phone]) is advised. The SARS-CoV-2 RNA is generally  detectable in upper and lower respiratory sp ecimens during the acute  phase of infection. The expected result is Negative. Fact Sheet for Patients:  StrictlyIdeas.no Fact Sheet for Healthcare Providers: BankingDealers.co.za This test is not yet approved or  cleared by the [place] and has been authorized for detection and/or diagnosis of SARS-CoV-2 by FDA under an Emergency Use Authorization (EUA).  This EUA will remain in effect (meaning this test can be used) for the duration of the COVID-19 declaration under Section 564(b)(1) of the Act, 21 U.S.C. section 360bbb-3(b)(1), unless the authorization is terminated or revoked sooner. Performed at [facility], [address], [place], [place] [address]   Surgical PCR screen     Status: None   Collection Time: 04/21/19  9:57 PM   Specimen: Nasal  Mucosa; Nasal Swab  Result Value Ref Range Status   MRSA, PCR NEGATIVE NEGATIVE Final   Staphylococcus aureus NEGATIVE NEGATIVE Final    Comment: (NOTE) The Xpert SA Assay (FDA approved for NASAL specimens in patients 31 years of age and older), is one component of a comprehensive surveillance program. It is not intended to diagnose infection nor to guide or monitor treatment. Performed at [facility], [address], [place], [place] [address]         Code Status Orders  (From admission, onward)         Start     Ordered   04/21/19 2134  Full code  Continuous     04/21/19 2133        Code Status History    This patient has a current code status but no historical code status.   Advance Care Planning Activity    Advance Directive Documentation     Most Recent Value  Type of Advance Directive  Healthcare Power of Attorney  Pre-existing out of facility DNR order (yellow form or pink MOST form)  -  "MOST" Form in Place?  -          Follow-up Information    Sharyne Peach, MD. Go on 05/06/2019.   Specialty: Family Medicine Why: @8 :20am  check cbc at time of visit for anemia f/u Contact information: [place] [address] [phone]        Algernon Huxley, MD. Go on 05/17/2019.   Specialties: Vascular Surgery, Radiology, Interventional Cardiology Why: @10 :45amhosp f/u Contact information: [place] [address] [phone]           Discharge Medications   Allergies as of 04/25/2019      Reactions   Morphine And Related Anaphylaxis   Aspirin Other (See Comments)   Patient on blood thinners and was told not to take   Benadryl [diphenhydramine Hcl] Hives   Codeine Other (See Comments)   AMS      Medication List    STOP taking these medications   warfarin 6 MG tablet Commonly known as: COUMADIN     TAKE these medications   acetaminophen 650 MG CR tablet Commonly known as: TYLENOL Take 650 mg by  mouth every 8 (eight) hours as needed for pain.   albuterol (2.5 MG/3ML) 0.083% nebulizer solution Commonly known as: PROVENTIL Take 2.5 mg by nebulization every 4 (four) hours as needed for wheezing or shortness of breath.   albuterol 108 (90 Base) MCG/ACT inhaler Commonly known as: VENTOLIN HFA Inhale 2 puffs into the lungs every 4 (four) hours as needed for wheezing or shortness of breath.   apixaban 5 MG Tabs tablet Commonly known as: ELIQUIS Take 2 tablets (10 mg total) by mouth 2 (two) times daily for 5 days. Notes to patient: 04/25/2019   apixaban 5 MG Tabs tablet Commonly known as: ELIQUIS Take 1  tablet (5 mg total) by mouth 2 (two) times daily. Start taking on: April 30, 2019 Notes to patient: 04/25/2019   buPROPion 150 MG 12 hr tablet Commonly known as: WELLBUTRIN SR Take 150 mg by mouth daily. Notes to patient: 04/26/2019   cyclobenzaprine 5 MG tablet Commonly known as: FLEXERIL Take 5 mg by mouth 3 (three) times daily as needed for muscle spasms.   ergocalciferol 1.25 MG (50000 UT) capsule Commonly known as: VITAMIN D2 Take 50,000 Units by mouth every Friday.   erythromycin ophthalmic ointment Place 1 application into both eyes at bedtime. Notes to patient: 04/25/2019   lansoprazole 30 MG capsule Commonly known as: PREVACID Take 30 mg by mouth daily.   levothyroxine 88 MCG tablet Commonly known as: SYNTHROID Take 88 mcg by mouth daily before breakfast. Notes to patient: 04/26/2019   losartan 50 MG tablet Commonly known as: COZAAR Take 50 mg by mouth daily. Notes to patient: 04/26/2019   metFORMIN 500 MG tablet Commonly known as: GLUCOPHAGE Take 500 mg by mouth daily.   mometasone 50 MCG/ACT nasal spray Commonly known as: NASONEX Place 2 sprays into the nose daily. Notes to patient: 04/26/2019   predniSONE 10 MG tablet Commonly known as: DELTASONE Take 10 mg by mouth daily with breakfast. Notes to patient: 04/26/2019   simvastatin 40 MG  tablet Commonly known as: ZOCOR Take 40 mg by mouth daily. Notes to patient: 04/25/2019            Durable Medical Equipment  (From admission, onward)         Start     Ordered   04/25/19 1203  For home use only DME Shower stool  Once    Comments: Shower chair   04/25/19 1202   04/24/19 1214  For home use only DME Walker rolling  Once    Question:  Patient needs a walker to treat with the following condition  Answer:  Weakness   04/24/19 1213   04/24/19 1214  For home use only DME 3 n 1  Once     04/24/19 1213             Total Time in preparing paper work, data evaluation and todays exam - 100 minutes  Dustin Flock M.D on 04/25/2019 at 12:59 PM [place]  [phone]

## 2019-05-10 ENCOUNTER — Other Ambulatory Visit: Payer: Self-pay

## 2019-05-10 ENCOUNTER — Encounter: Payer: Self-pay | Admitting: Oncology

## 2019-05-10 ENCOUNTER — Inpatient Hospital Stay: Payer: Medicaid Other | Attending: Oncology | Admitting: Oncology

## 2019-05-10 VITALS — BP 124/79 | HR 79 | Temp 98.5°F | Ht 60.0 in | Wt 154.4 lb

## 2019-05-10 DIAGNOSIS — E119 Type 2 diabetes mellitus without complications: Secondary | ICD-10-CM | POA: Diagnosis not present

## 2019-05-10 DIAGNOSIS — D509 Iron deficiency anemia, unspecified: Secondary | ICD-10-CM

## 2019-05-10 DIAGNOSIS — Z7951 Long term (current) use of inhaled steroids: Secondary | ICD-10-CM

## 2019-05-10 DIAGNOSIS — J449 Chronic obstructive pulmonary disease, unspecified: Secondary | ICD-10-CM | POA: Diagnosis not present

## 2019-05-10 DIAGNOSIS — Z86718 Personal history of other venous thrombosis and embolism: Secondary | ICD-10-CM | POA: Diagnosis not present

## 2019-05-10 DIAGNOSIS — Z87891 Personal history of nicotine dependence: Secondary | ICD-10-CM

## 2019-05-10 DIAGNOSIS — Z7901 Long term (current) use of anticoagulants: Secondary | ICD-10-CM

## 2019-05-10 DIAGNOSIS — I82401 Acute embolism and thrombosis of unspecified deep veins of right lower extremity: Secondary | ICD-10-CM

## 2019-05-10 DIAGNOSIS — Z803 Family history of malignant neoplasm of breast: Secondary | ICD-10-CM | POA: Diagnosis not present

## 2019-05-10 DIAGNOSIS — I10 Essential (primary) hypertension: Secondary | ICD-10-CM | POA: Diagnosis not present

## 2019-05-10 DIAGNOSIS — Z79899 Other long term (current) drug therapy: Secondary | ICD-10-CM

## 2019-05-10 DIAGNOSIS — Z7984 Long term (current) use of oral hypoglycemic drugs: Secondary | ICD-10-CM | POA: Diagnosis not present

## 2019-05-10 DIAGNOSIS — F329 Major depressive disorder, single episode, unspecified: Secondary | ICD-10-CM

## 2019-05-10 NOTE — Progress Notes (Signed)
Patient here today as a new patient with DVT in right leg.  Patient c/o some trouble walking due to right leg "aggrivating me".

## 2019-05-11 ENCOUNTER — Encounter: Payer: Self-pay | Admitting: Oncology

## 2019-05-11 NOTE — Progress Notes (Signed)
Hematology/Oncology Consult note [facility] [phone] [phone]  Patient Care Team: Sharyne Peach, MD as PCP - General (Family Medicine)   Name of the patient: Alison Lopez  [id number]  1956-09-30    Reason for referral- 1. Right lower extremity DVT 2. Iron deficiency anemia   Referring physician- Salome Holmes  Date of visit: 05/11/19   History of presenting illness- Patient is a 63 yr old female with prior h/o LLE DVT back in 1993 after an episode of pneumonia. Patient was started on coumadin back then and stayed on it. On 04/20/19 patient noticed pain and tightness in her RLE. She came to the ER and USG showed extensive right calf, femoral and popliteal DVT. INR was therapeutic at 2.2 on this day. Patient was switched to eliquis. Also seen by vascular surgery and underwent thrombectomy/ PTA and IVC filter placement. Patient reports intermittent headaches and nausea since she started taking eliquis. RLE swelling is getting better. She denies any DVT/PE in the 27 years she was on coumadin no pregnancy losses. No family h/o thrombosis. She has 2 adult children with no h/o DVT  At the time of DVT patient also noted to have iron deficiency anemia and thrombocytopenia. Platelet counts have been normal in 2018. Recent H/H was 7.7/25.3 and platelet count was 72. She did receive 1 dose of IV iron and currently she is taking oral iron. She denies any blood in stools. Stools have bene dark since she started taking po iron  ECOG PS- 1  Pain scale- 0   Review of systems- Review of Systems  Constitutional: Negative for chills, fever, malaise/fatigue and weight loss.  HENT: Negative for congestion, ear discharge and nosebleeds.   Eyes: Negative for blurred vision.  Respiratory: Negative for cough, hemoptysis, sputum production, shortness of breath and wheezing.   Cardiovascular: Negative for chest pain, palpitations, orthopnea and  claudication.  Gastrointestinal: Positive for nausea. Negative for abdominal pain, blood in stool, constipation, diarrhea, heartburn, melena and vomiting.  Genitourinary: Negative for dysuria, flank pain, frequency, hematuria and urgency.  Musculoskeletal: Negative for back pain, joint pain and myalgias.  Skin: Negative for rash.  Neurological: Positive for headaches. Negative for dizziness, tingling, focal weakness, seizures and weakness.  Endo/Heme/Allergies: Does not bruise/bleed easily.  Psychiatric/Behavioral: Negative for depression and suicidal ideas. The patient does not have insomnia.     Allergies  Allergen Reactions  . Morphine And Related Anaphylaxis  . Aspirin Other (See Comments)    Patient on blood thinners and was told not to take  . Benadryl [Diphenhydramine Hcl] Hives  . Codeine Other (See Comments)    AMS    Patient Active Problem List   Diagnosis Date Noted  . DVT (deep venous thrombosis) ([place]) 04/21/2019     Past Medical History:  Diagnosis Date  . Arthritis   . COPD (chronic obstructive pulmonary disease) ([place])   . Depression   . Diabetes mellitus without complication ([place])   . DVT (deep venous thrombosis) ([place])   . Fibromyalgia   . Hypertension   . Lupus ([place])   . Osteoporosis      Past Surgical History:  Procedure Laterality Date  . ABDOMINAL HYSTERECTOMY    . LOWER EXTREMITY INTERVENTION Right 04/22/2019   Procedure: IVC Filter Insertion with Right Lower Extremity Venous Lysis;  Surgeon: Algernon Huxley, MD;  Location: [facility];  Service: Cardiovascular;  Laterality: Right;  . OOPHORECTOMY      Social History   Socioeconomic  History  . Marital status: Divorced    Spouse name: Not on file  . Number of children: Not on file  . Years of education: Not on file  . Highest education level: Not on file  Occupational History  . Not on file  Social Needs  . Financial resource strain: Not on file  . Food insecurity    Worry: Not on  file    Inability: Not on file  . Transportation needs    Medical: Not on file    Non-medical: Not on file  Tobacco Use  . Smoking status: Former Smoker    Packs/day: 1.00    Years: 17.00    Pack years: 17.00    Types: Cigarettes    Quit date: 2002    Years since quitting: 18.5  . Smokeless tobacco: Never Used  Substance and Sexual Activity  . Alcohol use: Not Currently  . Drug use: Never  . Sexual activity: Not on file  Lifestyle  . Physical activity    Days per week: Not on file    Minutes per session: Not on file  . Stress: Not on file  Relationships  . Social Herbalist on phone: Not on file    Gets together: Not on file    Attends religious service: Not on file    Active member of club or organization: Not on file    Attends meetings of clubs or organizations: Not on file    Relationship status: Not on file  . Intimate partner violence    Fear of current or ex partner: Not on file    Emotionally abused: Not on file    Physically abused: Not on file    Forced sexual activity: Not on file  Other Topics Concern  . Not on file  Social History Narrative  . Not on file     Family History  Problem Relation Age of Onset  . Stomach cancer Mother   . Heart attack Father   . Breast cancer Neg Hx      Current Outpatient Medications:  .  acetaminophen (TYLENOL) 650 MG CR tablet, Take 650 mg by mouth every 8 (eight) hours as needed for pain., Disp: , Rfl:  .  albuterol (PROVENTIL) (2.5 MG/3ML) 0.083% nebulizer solution, Take 2.5 mg by nebulization every 4 (four) hours as needed for wheezing or shortness of breath. , Disp: , Rfl:  .  albuterol (VENTOLIN HFA) 108 (90 Base) MCG/ACT inhaler, Inhale 2 puffs into the lungs every 4 (four) hours as needed for wheezing or shortness of breath., Disp: , Rfl:  .  apixaban (ELIQUIS) 5 MG TABS tablet, Take 1 tablet (5 mg total) by mouth 2 (two) times daily., Disp: 60 tablet, Rfl: 2 .  ascorbic acid (VITAMIN C) 100 MG tablet,  Take 1,000 mg by mouth daily., Disp: , Rfl:  .  buPROPion (WELLBUTRIN SR) 150 MG 12 hr tablet, Take 150 mg by mouth daily., Disp: , Rfl:  .  cyclobenzaprine (FLEXERIL) 5 MG tablet, Take 5 mg by mouth 3 (three) times daily as needed for muscle spasms., Disp: , Rfl:  .  ergocalciferol (VITAMIN D2) 1.25 MG (50000 UT) capsule, Take 50,000 Units by mouth every Friday. , Disp: , Rfl:  .  erythromycin ophthalmic ointment, Place 1 application into both eyes at bedtime., Disp: , Rfl:  .  ferrous sulfate 325 (65 FE) MG tablet, Take by mouth., Disp: , Rfl:  .  lansoprazole (PREVACID) 30 MG capsule, Take  30 mg by mouth daily., Disp: , Rfl:  .  levothyroxine (SYNTHROID, LEVOTHROID) 88 MCG tablet, Take 88 mcg by mouth daily before breakfast., Disp: , Rfl:  .  losartan (COZAAR) 50 MG tablet, Take 50 mg by mouth daily., Disp: , Rfl:  .  metFORMIN (GLUCOPHAGE) 500 MG tablet, Take 500 mg by mouth daily. , Disp: , Rfl:  .  mometasone (NASONEX) 50 MCG/ACT nasal spray, Place 2 sprays into the nose daily., Disp: , Rfl:  .  predniSONE (DELTASONE) 10 MG tablet, Take 10 mg by mouth daily with breakfast., Disp: , Rfl:  .  simvastatin (ZOCOR) 40 MG tablet, Take 40 mg by mouth daily., Disp: , Rfl:  .  apixaban (ELIQUIS) 5 MG TABS tablet, Take 2 tablets (10 mg total) by mouth 2 (two) times daily for 5 days., Disp: 20 tablet, Rfl: 0   Physical exam:  Vitals:   05/10/19 1311  BP: 124/79  Pulse: 79  Temp: 98.5 F (36.9 C)  TempSrc: Tympanic  Weight: 154 lb 6 oz (70 kg)  Height: 5' (1.524 m)   Physical Exam HENT:     Head: Normocephalic and atraumatic.  Eyes:     Pupils: Pupils are equal, round, and reactive to light.  Neck:     Musculoskeletal: Normal range of motion.  Cardiovascular:     Rate and Rhythm: Normal rate and regular rhythm.     Heart sounds: Normal heart sounds.  Pulmonary:     Effort: Pulmonary effort is normal.     Breath sounds: Normal breath sounds.  Abdominal:     General: Bowel sounds are  normal.     Palpations: Abdomen is soft.  Musculoskeletal:     Comments: B/l compression stockings in place  Skin:    General: Skin is warm and dry.  Neurological:     Mental Status: She is alert and oriented to person, place, and time.        CMP Latest Ref Rng & Units 04/22/2019  Glucose 70 - 99 mg/dL 83  BUN 8 - 23 mg/dL 15  Creatinine 0.44 - 1.00 mg/dL 1.04(H)  Sodium 135 - 145 mmol/L 139  Potassium 3.5 - 5.1 mmol/L 4.4  Chloride 98 - 111 mmol/L 109  CO2 22 - 32 mmol/L 21(L)  Calcium 8.9 - 10.3 mg/dL 8.9   CBC Latest Ref Rng & Units 04/25/2019  WBC 4.0 - 10.5 K/uL 8.4  Hemoglobin 12.0 - 15.0 g/dL 7.7(L)  Hematocrit 36.0 - 46.0 % 25.3(L)  Platelets 150 - 400 K/uL 72(L)    No images are attached to the encounter.  US Venous Img Lower Unilateral Right  Result Date: 04/21/2019 CLINICAL DATA:  Calf pain x1 day EXAM: RIGHT LOWER EXTREMITY VENOUS DOPPLER ULTRASOUND TECHNIQUE: Gray-scale sonography with compression, as well as color and duplex ultrasound, were performed to evaluate the deep venous system from the level of the common femoral vein through the popliteal and proximal calf veins. COMPARISON:  None FINDINGS: Hypoechoic thrombus in common femoral vein involving saphenofemoral junction, extending into the femoral vein and deep femoral vein with very limited flow on color Doppler. The thrombus extends into the popliteal vein and is seen in posterior and peroneal veins, where it is incompletely occlusive. Survey views of the contralateral common femoral vein are unremarkable. IMPRESSION: 1. POSITIVE for extensive right calf, popliteal and femoral DVT, largely occlusive. Electronically Signed   By: Lucrezia Europe M.D.   On: 04/21/2019 16:52    Assessment and plan- Patient is a  63 y.o. female with following issues:  1. RLE DVT: This was while she was therapeutic on coumadin. She has taken coumadin for 27 years with no dvt and it is unclear why she suddenly developed DVT. Since this  is a second episode of DVT, she will remain on eliquis indefinitely. She does have normal kidney functions. Hypercoagulable work up would not change management but I will add antiphospholipid panel to next set of labs in 6 weeks  2. Iron deficiency anemia: patient had a blood loss of 500 cc during thrombectomy. Her hb dropped from 11 to 7. She has received IV iron 1 dose and is on PO iron which she wishes to continue. Repeat cbc ferritin and iron studies in 6 weeks. If she remains iron deficient- will give her IV iron. Her last colonoscopy was over 20 years ago. She ideally needs GI evaluation regardless. She would like to wait until her next set of labs.  Cbc ferritin and iron studies in 6 and 12 weeks. See md in  12 weeks. Antiphospholipid panel in 6 weeks   Thank you for this kind referral and the opportunity to participate in the care of this  Patient   Visit Diagnosis 1. Acute deep vein thrombosis (DVT) of right lower extremity, unspecified vein (HCC)     Dr. Randa Evens, MD, MPH [facility] at [facility] [phone] 05/11/2019

## 2019-05-17 ENCOUNTER — Encounter (INDEPENDENT_AMBULATORY_CARE_PROVIDER_SITE_OTHER): Payer: Self-pay | Admitting: Vascular Surgery

## 2019-05-17 ENCOUNTER — Other Ambulatory Visit: Payer: Self-pay

## 2019-05-17 ENCOUNTER — Ambulatory Visit (INDEPENDENT_AMBULATORY_CARE_PROVIDER_SITE_OTHER): Payer: Medicaid Other | Admitting: Vascular Surgery

## 2019-05-17 VITALS — BP 125/72 | HR 92 | Resp 15 | Ht 60.0 in | Wt 151.6 lb

## 2019-05-17 DIAGNOSIS — Z7901 Long term (current) use of anticoagulants: Secondary | ICD-10-CM

## 2019-05-17 DIAGNOSIS — E119 Type 2 diabetes mellitus without complications: Secondary | ICD-10-CM | POA: Diagnosis not present

## 2019-05-17 DIAGNOSIS — I82401 Acute embolism and thrombosis of unspecified deep veins of right lower extremity: Secondary | ICD-10-CM

## 2019-05-17 DIAGNOSIS — Z87891 Personal history of nicotine dependence: Secondary | ICD-10-CM | POA: Diagnosis not present

## 2019-05-17 DIAGNOSIS — I10 Essential (primary) hypertension: Secondary | ICD-10-CM | POA: Diagnosis not present

## 2019-05-17 DIAGNOSIS — Z79899 Other long term (current) drug therapy: Secondary | ICD-10-CM

## 2019-05-17 DIAGNOSIS — Z95828 Presence of other vascular implants and grafts: Secondary | ICD-10-CM

## 2019-05-17 DIAGNOSIS — Z7984 Long term (current) use of oral hypoglycemic drugs: Secondary | ICD-10-CM

## 2019-05-17 NOTE — Assessment & Plan Note (Signed)
The patient is doing well after venous thrombectomy and thrombolytic therapy for extensive right lower extremity DVT.  She does still have her filter in place.  I would recommend a duplex in the next 3 to 4 weeks to assess the right lower extremity DVT and determine if it is appropriate to remove the filter.  She will continue anticoagulation.  She will continue her stocking.  She will contact our office with problems in the interim.

## 2019-05-17 NOTE — Assessment & Plan Note (Signed)
blood pressure control important in reducing the progression of atherosclerotic disease. On appropriate oral medications.  

## 2019-05-17 NOTE — Patient Instructions (Signed)

## 2019-05-17 NOTE — Assessment & Plan Note (Signed)
blood glucose control important in reducing the progression of atherosclerotic disease. Also, involved in wound healing. On appropriate medications.  

## 2019-05-17 NOTE — Progress Notes (Signed)
MRN : [id number]  Alison Lopez is a 63 y.o. (10-29-1956) female who presents with chief complaint of  Chief Complaint  Patient presents with  . Follow-up    [facility] 3week follow up  .  History of Present Illness: Patient returns today in follow up of right lower extremity DVT.  She underwent right lower extremity venous lysis and thrombectomy with IVC filter placement.  Her leg is doing quite well.  There is really no appreciable swelling today.  Some mild pain but overall doing very well.  She is wearing her stockings.  The Eliquis is causing headaches but she had the DVT while therapeutic on Coumadin representing a failure of Coumadin.  She is willing to continue taking the Eliquis.  Current Outpatient Medications  Medication Sig Dispense Refill  . acetaminophen (TYLENOL) 650 MG CR tablet Take 650 mg by mouth every 8 (eight) hours as needed for pain.    Marland Kitchen albuterol (PROVENTIL) (2.5 MG/3ML) 0.083% nebulizer solution Take 2.5 mg by nebulization every 4 (four) hours as needed for wheezing or shortness of breath.     Marland Kitchen albuterol (VENTOLIN HFA) 108 (90 Base) MCG/ACT inhaler Inhale 2 puffs into the lungs every 4 (four) hours as needed for wheezing or shortness of breath.    Marland Kitchen apixaban (ELIQUIS) 5 MG TABS tablet Take 1 tablet (5 mg total) by mouth 2 (two) times daily. 60 tablet 2  . ascorbic acid (VITAMIN C) 100 MG tablet Take 1,000 mg by mouth daily.    Marland Kitchen buPROPion (WELLBUTRIN SR) 150 MG 12 hr tablet Take 150 mg by mouth daily.    . cyclobenzaprine (FLEXERIL) 5 MG tablet Take 5 mg by mouth 3 (three) times daily as needed for muscle spasms.    . ergocalciferol (VITAMIN D2) 1.25 MG (50000 UT) capsule Take 50,000 Units by mouth every Friday.     . erythromycin ophthalmic ointment Place 1 application into both eyes at bedtime.    . ferrous sulfate 325 (65 FE) MG tablet Take by mouth.    . lansoprazole (PREVACID) 30 MG capsule Take 30 mg by mouth daily.    Marland Kitchen levothyroxine (SYNTHROID, LEVOTHROID)  88 MCG tablet Take 88 mcg by mouth daily before breakfast.    . losartan (COZAAR) 50 MG tablet Take 50 mg by mouth daily.    . metFORMIN (GLUCOPHAGE) 500 MG tablet Take 500 mg by mouth daily.     . mometasone (NASONEX) 50 MCG/ACT nasal spray Place 2 sprays into the nose daily.    . predniSONE (DELTASONE) 10 MG tablet Take 10 mg by mouth daily with breakfast.    . simvastatin (ZOCOR) 40 MG tablet Take 40 mg by mouth daily.    Marland Kitchen apixaban (ELIQUIS) 5 MG TABS tablet Take 2 tablets (10 mg total) by mouth 2 (two) times daily for 5 days. 20 tablet 0   No current facility-administered medications for this visit.     Past Medical History:  Diagnosis Date  . Arthritis   . COPD (chronic obstructive pulmonary disease) ([place])   . Depression   . Diabetes mellitus without complication ([place])   . DVT (deep venous thrombosis) ([place])   . Fibromyalgia   . Hypertension   . Lupus ([place])   . Osteoporosis     Past Surgical History:  Procedure Laterality Date  . ABDOMINAL HYSTERECTOMY    . LOWER EXTREMITY INTERVENTION Right 04/22/2019   Procedure: IVC Filter Insertion with Right Lower Extremity Venous Lysis;  Surgeon: Algernon Huxley, MD;  Location: [facility];  Service: Cardiovascular;  Laterality: Right;  . OOPHORECTOMY      Social History Social History   Tobacco Use  . Smoking status: Former Smoker    Packs/day: 1.00    Years: 17.00    Pack years: 17.00    Types: Cigarettes    Quit date: 2002    Years since quitting: 18.5  . Smokeless tobacco: Never Used  Substance Use Topics  . Alcohol use: Not Currently  . Drug use: Never    Family History Family History  Problem Relation Age of Onset  . Stomach cancer Mother   . Heart attack Father   . Breast cancer Neg Hx     Allergies  Allergen Reactions  . Morphine And Related Anaphylaxis  . Aspirin Other (See Comments)    Patient on blood thinners and was told not to take  . Benadryl [Diphenhydramine Hcl] Hives  . Codeine Other  (See Comments)    AMS     REVIEW OF SYSTEMS (Negative unless checked)  Constitutional: [] Weight loss  [] Fever  [] Chills Cardiac: [] Chest pain   [] Chest pressure   [] Palpitations   [] Shortness of breath when laying flat   [] Shortness of breath at rest   [] Shortness of breath with exertion. Vascular:  [] Pain in legs with walking   [] Pain in legs at rest   [] Pain in legs when laying flat   [] Claudication   [] Pain in feet when walking  [] Pain in feet at rest  [] Pain in feet when laying flat   [x] History of DVT   [x] Phlebitis   [x] Swelling in legs   [] Varicose veins   [] Non-healing ulcers Pulmonary:   [] Uses home oxygen   [] Productive cough   [] Hemoptysis   [] Wheeze  [x] COPD   [] Asthma Neurologic:  [] Dizziness  [] Blackouts   [] Seizures   [] History of stroke   [] History of TIA  [] Aphasia   [] Temporary blindness   [] Dysphagia   [] Weakness or numbness in arms   [] Weakness or numbness in legs Musculoskeletal:  [x] Arthritis   [] Joint swelling   [x] Joint pain   [] Low back pain Hematologic:  [] Easy bruising  [] Easy bleeding   [] Hypercoagulable state   [] Anemic   Gastrointestinal:  [] Blood in stool   [] Vomiting blood  [x] Gastroesophageal reflux/heartburn   [] Abdominal pain Genitourinary:  [] Chronic kidney disease   [] Difficult urination  [] Frequent urination  [] Burning with urination   [] Hematuria Skin:  [] Rashes   [] Ulcers   [] Wounds Psychological:  [] History of anxiety   []  History of major depression.  Physical Examination  BP 125/72 (BP Location: Left Arm)   Pulse 92   Resp 15   Ht 5' (1.524 m)   Wt 151 lb 9.6 oz (68.8 kg)   BMI 29.61 kg/m  Gen:  WD/WN, NAD Head: [place]/AT, No temporalis wasting. Ear/Nose/Throat: Hearing grossly intact, nares w/o erythema or drainage Eyes: Conjunctiva clear. Sclera non-icteric Neck: Supple.  Trachea midline Pulmonary:  Good air movement, no use of accessory muscles.  Cardiac: RRR, no JVD Vascular:  Vessel Right Left  Radial Palpable Palpable                           PT Palpable Palpable  DP Palpable Palpable   Gastrointestinal: soft, non-tender/non-distended. No guarding/reflex.  Musculoskeletal: M/S 5/5 throughout.  No deformity or atrophy. No RLE edema, trace LLE edema. Neurologic: Sensation grossly intact in extremities.  Symmetrical.  Speech is fluent.  Psychiatric: Judgment intact, Mood &  affect appropriate for pt's clinical situation. Dermatologic: No rashes or ulcers noted.  No cellulitis or open wounds.       Labs Recent Results (from the past 2160 hour(s))  CBC     Status: Abnormal   Collection Time: 04/21/19  5:07 PM  Result Value Ref Range   WBC 11.3 (H) 4.0 - 10.5 K/uL   RBC 4.48 3.87 - 5.11 MIL/uL   Hemoglobin 11.0 (L) 12.0 - 15.0 g/dL   HCT 36.3 36.0 - 46.0 %   MCV 81.0 80.0 - 100.0 fL   MCH 24.6 (L) 26.0 - 34.0 pg   MCHC 30.3 30.0 - 36.0 g/dL   RDW 16.2 (H) 11.5 - 15.5 %   Platelets 94 (L) 150 - 400 K/uL    Comment: Immature Platelet Fraction may be clinically indicated, consider ordering this additional test [id number]    nRBC 0.0 0.0 - 0.2 %    Comment: Performed at [facility], [address] [place], [place] [address]  Basic metabolic panel     Status: Abnormal   Collection Time: 04/21/19  5:07 PM  Result Value Ref Range   Sodium 135 135 - 145 mmol/L   Potassium 3.3 (L) 3.5 - 5.1 mmol/L   Chloride 105 98 - 111 mmol/L   CO2 20 (L) 22 - 32 mmol/L   Glucose, Bld 87 70 - 99 mg/dL   BUN 15 8 - 23 mg/dL   Creatinine, Ser 1.06 (H) 0.44 - 1.00 mg/dL   Calcium 8.7 (L) 8.9 - 10.3 mg/dL   GFR calc non Af Amer 55 (L) >60 mL/min   GFR calc Af Amer >60 >60 mL/min   Anion gap 10 5 - 15    Comment: Performed at [facility], [address] [place], [place] [address]  Protime-INR     Status: Abnormal   Collection Time: 04/21/19  5:11 PM  Result Value Ref Range   Prothrombin Time 24.3 (H) 11.4 - 15.2 seconds   INR 2.2 (H) 0.8 - 1.2    Comment: (NOTE) INR goal varies based on device and  disease states. Performed at [facility], [address] [place], [place] [address]   SARS Coronavirus 2 (CEPHEID- Performed in [facility] hospital lab), Hosp Order     Status: None   Collection Time: 04/21/19  8:15 PM   Specimen: Nasopharyngeal Swab  Result Value Ref Range   SARS Coronavirus 2 NEGATIVE NEGATIVE    Comment: (NOTE) If result is NEGATIVE SARS-CoV-2 target nucleic acids are NOT DETECTED. The SARS-CoV-2 RNA is generally detectable in upper and lower  respiratory specimens during the acute phase of infection. The lowest  concentration of SARS-CoV-2 viral copies this assay can detect is 250  copies / mL. A negative result does not preclude SARS-CoV-2 infection  and should not be used as the sole basis for treatment or other  patient management decisions.  A negative result may occur with  improper specimen collection / handling, submission of specimen other  than nasopharyngeal swab, presence of viral mutation(s) within the  areas targeted by this assay, and inadequate number of viral copies  (<250 copies / mL). A negative result must be combined with clinical  observations, patient history, and epidemiological information. If result is POSITIVE SARS-CoV-2 target nucleic acids are DETECTED. The SARS-CoV-2 RNA is generally detectable in upper and lower  respiratory specimens dur ing the acute phase of infection.  Positive  results are indicative of active infection with SARS-CoV-2.  Clinical  correlation with patient history and other diagnostic information is  necessary to determine patient infection status.  Positive results do  not rule out bacterial infection or co-infection with other viruses. If result is PRESUMPTIVE POSTIVE SARS-CoV-2 nucleic acids MAY BE PRESENT.   A presumptive positive result was obtained on the submitted specimen  and confirmed on repeat testing.  While 2019 novel coronavirus  (SARS-CoV-2) nucleic acids may be present in the  submitted sample  additional confirmatory testing may be necessary for epidemiological  and / or clinical management purposes  to differentiate between  SARS-CoV-2 and other Sarbecovirus currently known to infect humans.  If clinically indicated additional testing with an alternate test  methodology [phone]) is advised. The SARS-CoV-2 RNA is generally  detectable in upper and lower respiratory sp ecimens during the acute  phase of infection. The expected result is Negative. Fact Sheet for Patients:  StrictlyIdeas.no Fact Sheet for Healthcare Providers: BankingDealers.co.za This test is not yet approved or cleared by the [place] FDA and has been authorized for detection and/or diagnosis of SARS-CoV-2 by FDA under an Emergency Use Authorization (EUA).  This EUA will remain in effect (meaning this test can be used) for the duration of the COVID-19 declaration under Section 564(b)(1) of the Act, 21 U.S.C. section 360bbb-3(b)(1), unless the authorization is terminated or revoked sooner. Performed at [facility], [address] [place], [place] [address]   APTT     Status: Abnormal   Collection Time: 04/21/19  8:15 PM  Result Value Ref Range   aPTT 45 (H) 24 - 36 seconds    Comment:        IF BASELINE aPTT IS ELEVATED, SUGGEST PATIENT RISK ASSESSMENT BE USED TO DETERMINE APPROPRIATE ANTICOAGULANT THERAPY. Performed at [facility], [address] [place], [place] [address]   Surgical PCR screen     Status: None   Collection Time: 04/21/19  9:57 PM   Specimen: Nasal Mucosa; Nasal Swab  Result Value Ref Range   MRSA, PCR NEGATIVE NEGATIVE   Staphylococcus aureus NEGATIVE NEGATIVE    Comment: (NOTE) The Xpert SA Assay (FDA approved for NASAL specimens in patients 78 years of age and older), is one component of a comprehensive surveillance program. It is not intended to diagnose infection nor to guide or  monitor treatment. Performed at [facility], [address] [place], [place] [address]   Glucose, capillary     Status: None   Collection Time: 04/21/19 10:01 PM  Result Value Ref Range   Glucose-Capillary 83 70 - 99 mg/dL   Comment 1 Notify RN   Heparin level (unfractionated)     Status: None   Collection Time: 04/22/19  7:30 AM  Result Value Ref Range   Heparin Unfractionated 0.63 0.30 - 0.70 IU/mL    Comment: (NOTE) If heparin results are below expected values, and patient dosage has  been confirmed, suggest follow up testing of antithrombin III levels. Performed at [facility], [address] [place], [place] [address]   CBC     Status: Abnormal   Collection Time: 04/22/19  7:30 AM  Result Value Ref Range   WBC 9.2 4.0 - 10.5 K/uL   RBC 4.11 3.87 - 5.11 MIL/uL   Hemoglobin 10.2 (L) 12.0 - 15.0 g/dL   HCT 33.4 (L) 36.0 - 46.0 %   MCV 81.3 80.0 - 100.0 fL   MCH 24.8 (L) 26.0 - 34.0 pg   MCHC 30.5 30.0 - 36.0  g/dL   RDW 16.1 (H) 11.5 - 15.5 %   Platelets 83 (L) 150 - 400 K/uL    Comment: Immature Platelet Fraction may be clinically indicated, consider ordering this additional test [id number]    nRBC 0.0 0.0 - 0.2 %    Comment: Performed at [facility], [address] [place], [place] [address]  HIV antibody (Routine Testing)     Status: None   Collection Time: 04/22/19  7:30 AM  Result Value Ref Range   HIV Screen 4th Generation wRfx Non Reactive Non Reactive    Comment: (NOTE) Performed At: [facility] [place], [place] [id number] Rush Farmer MD [id number]   Basic metabolic panel     Status: Abnormal   Collection Time: 04/22/19  7:30 AM  Result Value Ref Range   Sodium 139 135 - 145 mmol/L   Potassium 4.4 3.5 - 5.1 mmol/L   Chloride 109 98 - 111 mmol/L   CO2 21 (L) 22 - 32 mmol/L   Glucose, Bld 83 70 - 99 mg/dL   BUN 15 8 - 23 mg/dL   Creatinine, Ser 1.04 (H) 0.44 - 1.00 mg/dL   Calcium 8.9 8.9 -  10.3 mg/dL   GFR calc non Af Amer 57 (L) >60 mL/min   GFR calc Af Amer >60 >60 mL/min   Anion gap 9 5 - 15    Comment: Performed at [facility], [address] [place], [place] [address]  Hemoglobin A1c     Status: Abnormal   Collection Time: 04/22/19  7:30 AM  Result Value Ref Range   Hgb A1c MFr Bld 6.0 (H) 4.8 - 5.6 %    Comment: (NOTE) Pre diabetes:          5.7%-6.4% Diabetes:              >6.4% Glycemic control for   <7.0% adults with diabetes    Mean Plasma Glucose 125.5 mg/dL    Comment: Performed at [facility] [address] [place], [place] [address]  Magnesium     Status: None   Collection Time: 04/22/19  7:30 AM  Result Value Ref Range   Magnesium 2.1 1.7 - 2.4 mg/dL    Comment: Performed at [facility], [address] [place], [place] [address]  Glucose, capillary     Status: None   Collection Time: 04/22/19  7:36 AM  Result Value Ref Range   Glucose-Capillary 82 70 - 99 mg/dL   Comment 1 Notify RN   Glucose, capillary     Status: None   Collection Time: 04/22/19 11:46 AM  Result Value Ref Range   Glucose-Capillary 86 70 - 99 mg/dL   Comment 1 Notify RN   Heparin level (unfractionated)     Status: None   Collection Time: 04/22/19  1:58 PM  Result Value Ref Range   Heparin Unfractionated 0.46 0.30 - 0.70 IU/mL    Comment: (NOTE) If heparin results are below expected values, and patient dosage has  been confirmed, suggest follow up testing of antithrombin III levels. Performed at [facility], [address] [place], [place] [address]   Glucose, capillary     Status: None   Collection Time: 04/22/19  2:29 PM  Result Value Ref Range   Glucose-Capillary 77 70 - 99 mg/dL  Glucose, capillary     Status: None   Collection Time: 04/22/19  4:31 PM  Result Value Ref Range   Glucose-Capillary 96 70 -  99 mg/dL  Glucose, capillary     Status: None   Collection Time: 04/22/19  5:32 PM  Result Value Ref Range    Glucose-Capillary 98 70 - 99 mg/dL   Comment 1 Notify RN   Glucose, capillary     Status: Abnormal   Collection Time: 04/22/19  9:11 PM  Result Value Ref Range   Glucose-Capillary 103 (H) 70 - 99 mg/dL   Comment 1 Notify RN   CBC     Status: Abnormal   Collection Time: 04/23/19  5:44 AM  Result Value Ref Range   WBC 9.0 4.0 - 10.5 K/uL   RBC 3.39 (L) 3.87 - 5.11 MIL/uL   Hemoglobin 8.4 (L) 12.0 - 15.0 g/dL   HCT 27.6 (L) 36.0 - 46.0 %   MCV 81.4 80.0 - 100.0 fL   MCH 24.8 (L) 26.0 - 34.0 pg   MCHC 30.4 30.0 - 36.0 g/dL   RDW 16.3 (H) 11.5 - 15.5 %   Platelets 70 (L) 150 - 400 K/uL    Comment: Immature Platelet Fraction may be clinically indicated, consider ordering this additional test [id number]    nRBC 0.0 0.0 - 0.2 %    Comment: Performed at [facility], [address] [place], [place] [address]  Heparin level (unfractionated)     Status: None   Collection Time: 04/23/19  5:44 AM  Result Value Ref Range   Heparin Unfractionated 0.56 0.30 - 0.70 IU/mL    Comment: (NOTE) If heparin results are below expected values, and patient dosage has  been confirmed, suggest follow up testing of antithrombin III levels. Performed at [facility], [address] [place],  [address]   Glucose, capillary     Status: None   Collection Time: 04/23/19  7:40 AM  Result Value Ref Range   Glucose-Capillary 79 70 - 99 mg/dL  Glucose, capillary     Status: None   Collection Time: 04/23/19 12:30 PM  Result Value Ref Range   Glucose-Capillary 85 70 - 99 mg/dL   Comment 1 Notify RN   Glucose, capillary     Status: None   Collection Time: 04/23/19  4:42 PM  Result Value Ref Range   Glucose-Capillary 90 70 - 99 mg/dL   Comment 1 Notify RN   Glucose, capillary     Status: Abnormal   Collection Time: 04/23/19  9:00 PM  Result Value Ref Range   Glucose-Capillary 135 (H) 70 - 99 mg/dL  CBC     Status: Abnormal   Collection Time: 04/24/19  5:33 AM  Result Value Ref  Range   WBC 8.1 4.0 - 10.5 K/uL   RBC 3.11 (L) 3.87 - 5.11 MIL/uL   Hemoglobin 7.7 (L) 12.0 - 15.0 g/dL   HCT 26.1 (L) 36.0 - 46.0 %   MCV 83.9 80.0 - 100.0 fL   MCH 24.8 (L) 26.0 - 34.0 pg   MCHC 29.5 (L) 30.0 - 36.0 g/dL   RDW 16.2 (H) 11.5 - 15.5 %   Platelets 73 (L) 150 - 400 K/uL    Comment: Immature Platelet Fraction may be clinically indicated, consider ordering this additional test [id number] CONSISTENT WITH PREVIOUS RESULT    nRBC 0.0 0.0 - 0.2 %    Comment: Performed at [facility], [address] [place], [place] [address]  Glucose, capillary     Status: None   Collection Time: 04/24/19  7:42 AM  Result Value Ref Range   Glucose-Capillary 89 70 -  99 mg/dL   Comment 1 Notify RN   Vitamin B12     Status: None   Collection Time: 04/24/19  8:41 AM  Result Value Ref Range   Vitamin B-12 516 180 - 914 pg/mL    Comment: (NOTE) This assay is not validated for testing neonatal or myeloproliferative syndrome specimens for Vitamin B12 levels. Performed at [facility], [place] [address], [place], [place] [address]   Folate     Status: None   Collection Time: 04/24/19  8:41 AM  Result Value Ref Range   Folate 35.0 >5.9 ng/mL    Comment: Performed at [facility], [address] [place], [place] [address]  Iron and TIBC     Status: Abnormal   Collection Time: 04/24/19  8:41 AM  Result Value Ref Range   Iron 33 28 - 170 ug/dL   TIBC 341 250 - 450 ug/dL   Saturation Ratios 10 (L) 10.4 - 31.8 %   UIBC 308 ug/dL    Comment: Performed at [facility], [address] [place], [place] [address]  Ferritin     Status: None   Collection Time: 04/24/19  8:41 AM  Result Value Ref Range   Ferritin 17 11 - 307 ng/mL    Comment: Performed at [facility], [address] [place], [place] [address]  Reticulocytes     Status: Abnormal   Collection Time: 04/24/19  8:41 AM  Result Value Ref Range   Retic Ct Pct 1.8 0.4 - 3.1 %   RBC. 3.43  (L) 3.87 - 5.11 MIL/uL   Retic Count, Absolute 62.4 19.0 - 186.0 K/uL   Immature Retic Fract 23.4 (H) 2.3 - 15.9 %    Comment: Performed at [facility], [address] [place], [place] [address]  Glucose, capillary     Status: None   Collection Time: 04/24/19 11:16 AM  Result Value Ref Range   Glucose-Capillary 95 70 - 99 mg/dL   Comment 1 Notify RN   Glucose, capillary     Status: Abnormal   Collection Time: 04/24/19  5:58 PM  Result Value Ref Range   Glucose-Capillary 153 (H) 70 - 99 mg/dL  Glucose, capillary     Status: None   Collection Time: 04/24/19  9:39 PM  Result Value Ref Range   Glucose-Capillary 91 70 - 99 mg/dL   Comment 1 Notify RN   CBC     Status: Abnormal   Collection Time: 04/25/19  4:17 AM  Result Value Ref Range   WBC 8.4 4.0 - 10.5 K/uL   RBC 3.10 (L) 3.87 - 5.11 MIL/uL   Hemoglobin 7.7 (L) 12.0 - 15.0 g/dL   HCT 25.3 (L) 36.0 - 46.0 %   MCV 81.6 80.0 - 100.0 fL   MCH 24.8 (L) 26.0 - 34.0 pg   MCHC 30.4 30.0 - 36.0 g/dL   RDW 16.3 (H) 11.5 - 15.5 %   Platelets 72 (L) 150 - 400 K/uL    Comment: Immature Platelet Fraction may be clinically indicated, consider ordering this additional test [id number]    nRBC 0.0 0.0 - 0.2 %    Comment: Performed at [facility], [address] [place], [place] [address]  Glucose, capillary     Status: None   Collection Time: 04/25/19  7:43 AM  Result Value Ref Range   Glucose-Capillary 85 70 - 99 mg/dL  Glucose, capillary     Status: None   Collection Time: 04/25/19  11:49 AM  Result Value Ref Range   Glucose-Capillary 75 70 - 99 mg/dL    Radiology US Venous Img Lower Unilateral Right  Result Date: 04/21/2019 CLINICAL DATA:  Calf pain x1 day EXAM: RIGHT LOWER EXTREMITY VENOUS DOPPLER ULTRASOUND TECHNIQUE: Gray-scale sonography with compression, as well as color and duplex ultrasound, were performed to evaluate the deep venous system from the level of the common femoral vein through the popliteal  and proximal calf veins. COMPARISON:  None FINDINGS: Hypoechoic thrombus in common femoral vein involving saphenofemoral junction, extending into the femoral vein and deep femoral vein with very limited flow on color Doppler. The thrombus extends into the popliteal vein and is seen in posterior and peroneal veins, where it is incompletely occlusive. Survey views of the contralateral common femoral vein are unremarkable. IMPRESSION: 1. POSITIVE for extensive right calf, popliteal and femoral DVT, largely occlusive. Electronically Signed   By: Lucrezia Europe M.D.   On: 04/21/2019 16:52    Assessment/Plan  Diabetes (HCC) blood glucose control important in reducing the progression of atherosclerotic disease. Also, involved in wound healing. On appropriate medications.   Essential hypertension blood pressure control important in reducing the progression of atherosclerotic disease. On appropriate oral medications.   DVT (deep venous thrombosis) ([place]) The patient is doing well after venous thrombectomy and thrombolytic therapy for extensive right lower extremity DVT.  She does still have her filter in place.  I would recommend a duplex in the next 3 to 4 weeks to assess the right lower extremity DVT and determine if it is appropriate to remove the filter.  She will continue anticoagulation.  She will continue her stocking.  She will contact our office with problems in the interim.    Leotis Pain, MD  05/17/2019 12:08 PM    This note was created with Dragon medical transcription system.  Any errors from dictation are purely unintentional

## 2019-06-21 ENCOUNTER — Inpatient Hospital Stay: Payer: Medicare Other | Attending: Oncology

## 2019-06-21 ENCOUNTER — Other Ambulatory Visit: Payer: Self-pay

## 2019-06-21 DIAGNOSIS — Z87891 Personal history of nicotine dependence: Secondary | ICD-10-CM | POA: Insufficient documentation

## 2019-06-21 DIAGNOSIS — I82401 Acute embolism and thrombosis of unspecified deep veins of right lower extremity: Secondary | ICD-10-CM

## 2019-06-21 DIAGNOSIS — Z86718 Personal history of other venous thrombosis and embolism: Secondary | ICD-10-CM | POA: Diagnosis not present

## 2019-06-21 DIAGNOSIS — D509 Iron deficiency anemia, unspecified: Secondary | ICD-10-CM

## 2019-06-21 DIAGNOSIS — Z7901 Long term (current) use of anticoagulants: Secondary | ICD-10-CM | POA: Insufficient documentation

## 2019-06-23 LAB — CARDIOLIPIN ANTIBODIES, IGM+IGG
Anticardiolipin IgG: 74 GPL U/mL — ABNORMAL HIGH (ref 0–14)
Anticardiolipin IgM: 15 MPL U/mL — ABNORMAL HIGH (ref 0–12)

## 2019-06-23 LAB — BETA-2-GLYCOPROTEIN I ABS, IGG/M/A
Beta-2 Glyco I IgG: 48 GPI IgG units — ABNORMAL HIGH (ref 0–20)
Beta-2-Glycoprotein I IgA: 22 GPI IgA units (ref 0–25)
Beta-2-Glycoprotein I IgM: <9 GPI IgM units (ref 0–32)

## 2019-06-28 ENCOUNTER — Encounter (INDEPENDENT_AMBULATORY_CARE_PROVIDER_SITE_OTHER): Payer: Self-pay | Admitting: Vascular Surgery

## 2019-06-28 ENCOUNTER — Ambulatory Visit (INDEPENDENT_AMBULATORY_CARE_PROVIDER_SITE_OTHER): Payer: Medicare Other | Admitting: Vascular Surgery

## 2019-06-28 ENCOUNTER — Ambulatory Visit (INDEPENDENT_AMBULATORY_CARE_PROVIDER_SITE_OTHER): Payer: Medicare Other

## 2019-06-28 ENCOUNTER — Other Ambulatory Visit: Payer: Self-pay

## 2019-06-28 VITALS — BP 143/79 | HR 85 | Resp 16 | Ht 60.0 in | Wt 144.0 lb

## 2019-06-28 DIAGNOSIS — I82401 Acute embolism and thrombosis of unspecified deep veins of right lower extremity: Secondary | ICD-10-CM | POA: Diagnosis not present

## 2019-06-28 DIAGNOSIS — E119 Type 2 diabetes mellitus without complications: Secondary | ICD-10-CM

## 2019-06-28 DIAGNOSIS — F32A Depression, unspecified: Secondary | ICD-10-CM | POA: Insufficient documentation

## 2019-06-28 DIAGNOSIS — M069 Rheumatoid arthritis, unspecified: Secondary | ICD-10-CM | POA: Insufficient documentation

## 2019-06-28 DIAGNOSIS — I10 Essential (primary) hypertension: Secondary | ICD-10-CM | POA: Diagnosis not present

## 2019-06-28 DIAGNOSIS — E785 Hyperlipidemia, unspecified: Secondary | ICD-10-CM | POA: Insufficient documentation

## 2019-06-28 DIAGNOSIS — M797 Fibromyalgia: Secondary | ICD-10-CM | POA: Insufficient documentation

## 2019-06-28 DIAGNOSIS — F329 Major depressive disorder, single episode, unspecified: Secondary | ICD-10-CM | POA: Insufficient documentation

## 2019-06-28 DIAGNOSIS — J449 Chronic obstructive pulmonary disease, unspecified: Secondary | ICD-10-CM | POA: Insufficient documentation

## 2019-06-28 DIAGNOSIS — K219 Gastro-esophageal reflux disease without esophagitis: Secondary | ICD-10-CM | POA: Insufficient documentation

## 2019-06-28 DIAGNOSIS — Z227 Latent tuberculosis: Secondary | ICD-10-CM | POA: Insufficient documentation

## 2019-06-28 NOTE — Progress Notes (Signed)
MRN : [id number]  Alison Lopez is a 63 y.o. (November 15, 1955) female who presents with chief complaint of  Chief Complaint  Patient presents with  . Follow-up    ultrasound follow up  .  History of Present Illness: Patient returns today in follow up of her right leg DVT.  She is doing well.  She has some occasional pain behind her right knee, but overall the leg is doing well with no significant swelling.  She continues on anticoagulation.  A little over 2 months ago she underwent extensive right lower extremity thrombectomy and thrombolytic therapy with IVC filter placement.  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  This is a marked improvement from her previous studies.  Current Outpatient Medications  Medication Sig Dispense Refill  . acetaminophen (TYLENOL) 650 MG CR tablet Take 650 mg by mouth every 8 (eight) hours as needed for pain.    Marland Kitchen albuterol (PROVENTIL) (2.5 MG/3ML) 0.083% nebulizer solution Take 2.5 mg by nebulization every 4 (four) hours as needed for wheezing or shortness of breath.     Marland Kitchen albuterol (VENTOLIN HFA) 108 (90 Base) MCG/ACT inhaler Inhale 2 puffs into the lungs every 4 (four) hours as needed for wheezing or shortness of breath.    Marland Kitchen apixaban (ELIQUIS) 5 MG TABS tablet Take 1 tablet (5 mg total) by mouth 2 (two) times daily. 60 tablet 2  . ascorbic acid (VITAMIN C) 100 MG tablet Take 1,000 mg by mouth daily.    Marland Kitchen buPROPion (WELLBUTRIN SR) 150 MG 12 hr tablet Take 150 mg by mouth daily.    . cyclobenzaprine (FLEXERIL) 5 MG tablet Take 5 mg by mouth 3 (three) times daily as needed for muscle spasms.    . ergocalciferol (VITAMIN D2) 1.25 MG (50000 UT) capsule Take 50,000 Units by mouth every Friday.     . erythromycin ophthalmic ointment Place 1 application into both eyes at bedtime.    . ferrous sulfate 325 (65 FE) MG tablet Take by mouth.     . lansoprazole (PREVACID) 30 MG capsule Take 30 mg by mouth daily.    Marland Kitchen levothyroxine (SYNTHROID, LEVOTHROID) 88 MCG tablet Take 88 mcg by mouth daily before breakfast.    . losartan (COZAAR) 50 MG tablet Take 50 mg by mouth daily.    . metFORMIN (GLUCOPHAGE) 500 MG tablet Take 500 mg by mouth daily.     . mometasone (NASONEX) 50 MCG/ACT nasal spray Place 2 sprays into the nose daily.    . predniSONE (DELTASONE) 10 MG tablet Take 10 mg by mouth daily with breakfast.    . simvastatin (ZOCOR) 40 MG tablet Take 40 mg by mouth daily.    Marland Kitchen apixaban (ELIQUIS) 5 MG TABS tablet Take 2 tablets (10 mg total) by mouth 2 (two) times daily for 5 days. 20 tablet 0   No current facility-administered medications for this visit.     Past Medical History:  Diagnosis Date  . Arthritis   . COPD (chronic obstructive pulmonary disease) ([place])   . Depression   . Diabetes mellitus without complication ([place])   . DVT (deep venous thrombosis) ([place])   . Fibromyalgia   . Hypertension   . Lupus ([place])   . Osteoporosis     Past Surgical History:  Procedure Laterality Date  . ABDOMINAL HYSTERECTOMY    . LOWER EXTREMITY INTERVENTION Right 04/22/2019  Procedure: IVC Filter Insertion with Right Lower Extremity Venous Lysis;  Surgeon: Algernon Huxley, MD;  Location: [facility];  Service: Cardiovascular;  Laterality: Right;  . OOPHORECTOMY     Social History        Tobacco Use  . Smoking status: Former Smoker    Packs/day: 1.00    Years: 17.00    Pack years: 17.00    Types: Cigarettes    Quit date: 2002    Years since quitting: 18.5  . Smokeless tobacco: Never Used  Substance Use Topics  . Alcohol use: Not Currently  . Drug use: Never    Family History      Family History  Problem Relation Age of Onset  . Stomach cancer Mother   . Heart attack Father   . Breast cancer Neg Hx          Allergies  Allergen Reactions  . Morphine And Related Anaphylaxis  . Aspirin Other  (See Comments)    Patient on blood thinners and was told not to take  . Benadryl [Diphenhydramine Hcl] Hives  . Codeine Other (See Comments)    AMS     REVIEW OF SYSTEMS (Negative unless checked)  Constitutional: [] ?Weight loss  [] ?Fever  [] ?Chills Cardiac: [] ?Chest pain   [] ?Chest pressure   [] ?Palpitations   [] ?Shortness of breath when laying flat   [] ?Shortness of breath at rest   [] ?Shortness of breath with exertion. Vascular:  [] ?Pain in legs with walking   [] ?Pain in legs at rest   [] ?Pain in legs when laying flat   [] ?Claudication   [] ?Pain in feet when walking  [] ?Pain in feet at rest  [] ?Pain in feet when laying flat   [x] ?History of DVT   [x] ?Phlebitis   [x] ?Swelling in legs   [] ?Varicose veins   [] ?Non-healing ulcers Pulmonary:   [] ?Uses home oxygen   [] ?Productive cough   [] ?Hemoptysis   [] ?Wheeze  [x] ?COPD   [] ?Asthma Neurologic:  [] ?Dizziness  [] ?Blackouts   [] ?Seizures   [] ?History of stroke   [] ?History of TIA  [] ?Aphasia   [] ?Temporary blindness   [] ?Dysphagia   [] ?Weakness or numbness in arms   [] ?Weakness or numbness in legs Musculoskeletal:  [x] ?Arthritis   [] ?Joint swelling   [x] ?Joint pain   [] ?Low back pain Hematologic:  [] ?Easy bruising  [] ?Easy bleeding   [] ?Hypercoagulable state   [] ?Anemic   Gastrointestinal:  [] ?Blood in stool   [] ?Vomiting blood  [x] ?Gastroesophageal reflux/heartburn   [] ?Abdominal pain Genitourinary:  [] ?Chronic kidney disease   [] ?Difficult urination  [] ?Frequent urination  [] ?Burning with urination   [] ?Hematuria Skin:  [] ?Rashes   [] ?Ulcers   [] ?Wounds Psychological:  [] ?History of anxiety   [] ? History of major depression.    Physical Examination  BP (!) 143/79 (BP Location: Right Arm)   Pulse 85   Resp 16   Ht 5' (1.524 m)   Wt 144 lb (65.3 kg)   BMI 28.12 kg/m  Gen:  WD/WN, NAD Head: [place]/AT, No temporalis wasting. Ear/Nose/Throat: Hearing grossly intact, nares w/o erythema or drainage Eyes: Conjunctiva clear. Sclera  non-icteric Neck: Supple.  Trachea midline Pulmonary:  Good air movement, no use of accessory muscles.  Cardiac: RRR, no JVD Vascular:  Vessel Right Left  Radial Palpable Palpable                          PT Palpable Palpable  DP Palpable Palpable   Gastrointestinal: soft, non-tender/non-distended. No guarding/reflex.  Musculoskeletal: M/S 5/5  throughout.  No deformity or atrophy.  No right lower extremity edema, 1-2+ left lower extremity edema. Neurologic: Sensation grossly intact in extremities.  Symmetrical.  Speech is fluent.  Psychiatric: Judgment intact, Mood & affect appropriate for pt's clinical situation. Dermatologic: No rashes or ulcers noted.  No cellulitis or open wounds.       Labs Recent Results (from the past 2160 hour(s))  CBC     Status: Abnormal   Collection Time: 04/21/19  5:07 PM  Result Value Ref Range   WBC 11.3 (H) 4.0 - 10.5 K/uL   RBC 4.48 3.87 - 5.11 MIL/uL   Hemoglobin 11.0 (L) 12.0 - 15.0 g/dL   HCT 36.3 36.0 - 46.0 %   MCV 81.0 80.0 - 100.0 fL   MCH 24.6 (L) 26.0 - 34.0 pg   MCHC 30.3 30.0 - 36.0 g/dL   RDW 16.2 (H) 11.5 - 15.5 %   Platelets 94 (L) 150 - 400 K/uL    Comment: Immature Platelet Fraction may be clinically indicated, consider ordering this additional test [id number]    nRBC 0.0 0.0 - 0.2 %    Comment: Performed at [facility], [address] [place], [place] [address]  Basic metabolic panel     Status: Abnormal   Collection Time: 04/21/19  5:07 PM  Result Value Ref Range   Sodium 135 135 - 145 mmol/L   Potassium 3.3 (L) 3.5 - 5.1 mmol/L   Chloride 105 98 - 111 mmol/L   CO2 20 (L) 22 - 32 mmol/L   Glucose, Bld 87 70 - 99 mg/dL   BUN 15 8 - 23 mg/dL   Creatinine, Ser 1.06 (H) 0.44 - 1.00 mg/dL   Calcium 8.7 (L) 8.9 - 10.3 mg/dL   GFR calc non Af Amer 55 (L) >60 mL/min   GFR calc Af Amer >60 >60 mL/min   Anion gap 10 5 - 15    Comment: Performed at [facility], [address] [place],  [place] [address]  Protime-INR     Status: Abnormal   Collection Time: 04/21/19  5:11 PM  Result Value Ref Range   Prothrombin Time 24.3 (H) 11.4 - 15.2 seconds   INR 2.2 (H) 0.8 - 1.2    Comment: (NOTE) INR goal varies based on device and disease states. Performed at [facility], [address], [place], [place] [address]   SARS Coronavirus 2 (CEPHEID- Performed in [facility] hospital lab), Hosp Order     Status: None   Collection Time: 04/21/19  8:15 PM   Specimen: Nasopharyngeal Swab  Result Value Ref Range   SARS Coronavirus 2 NEGATIVE NEGATIVE    Comment: (NOTE) If result is NEGATIVE SARS-CoV-2 target nucleic acids are NOT DETECTED. The SARS-CoV-2 RNA is generally detectable in upper and lower  respiratory specimens during the acute phase of infection. The lowest  concentration of SARS-CoV-2 viral copies this assay can detect is 250  copies / mL. A negative result does not preclude SARS-CoV-2 infection  and should not be used as the sole basis for treatment or other  patient management decisions.  A negative result may occur with  improper specimen collection / handling, submission of specimen other  than nasopharyngeal swab, presence of viral mutation(s) within the  areas targeted by this assay, and inadequate number of viral copies  (<250 copies / mL). A negative result must be combined with clinical  observations, patient history, and epidemiological information. If result is POSITIVE SARS-CoV-2 target nucleic  acids are DETECTED. The SARS-CoV-2 RNA is generally detectable in upper and lower  respiratory specimens dur ing the acute phase of infection.  Positive  results are indicative of active infection with SARS-CoV-2.  Clinical  correlation with patient history and other diagnostic information is  necessary to determine patient infection status.  Positive results do  not rule out bacterial infection or co-infection with other viruses. If result is PRESUMPTIVE  POSTIVE SARS-CoV-2 nucleic acids MAY BE PRESENT.   A presumptive positive result was obtained on the submitted specimen  and confirmed on repeat testing.  While 2019 novel coronavirus  (SARS-CoV-2) nucleic acids may be present in the submitted sample  additional confirmatory testing may be necessary for epidemiological  and / or clinical management purposes  to differentiate between  SARS-CoV-2 and other Sarbecovirus currently known to infect humans.  If clinically indicated additional testing with an alternate test  methodology [phone]) is advised. The SARS-CoV-2 RNA is generally  detectable in upper and lower respiratory sp ecimens during the acute  phase of infection. The expected result is Negative. Fact Sheet for Patients:  StrictlyIdeas.no Fact Sheet for Healthcare Providers: BankingDealers.co.za This test is not yet approved or cleared by the [place] FDA and has been authorized for detection and/or diagnosis of SARS-CoV-2 by FDA under an Emergency Use Authorization (EUA).  This EUA will remain in effect (meaning this test can be used) for the duration of the COVID-19 declaration under Section 564(b)(1) of the Act, 21 U.S.C. section 360bbb-3(b)(1), unless the authorization is terminated or revoked sooner. Performed at [facility], [address] [place], [place] [address]   APTT     Status: Abnormal   Collection Time: 04/21/19  8:15 PM  Result Value Ref Range   aPTT 45 (H) 24 - 36 seconds    Comment:        IF BASELINE aPTT IS ELEVATED, SUGGEST PATIENT RISK ASSESSMENT BE USED TO DETERMINE APPROPRIATE ANTICOAGULANT THERAPY. Performed at [facility], [address], [place], [place] [address]   Surgical PCR screen     Status: None   Collection Time: 04/21/19  9:57 PM   Specimen: Nasal Mucosa; Nasal Swab  Result Value Ref Range   MRSA, PCR NEGATIVE NEGATIVE   Staphylococcus aureus NEGATIVE  NEGATIVE    Comment: (NOTE) The Xpert SA Assay (FDA approved for NASAL specimens in patients 29 years of age and older), is one component of a comprehensive surveillance program. It is not intended to diagnose infection nor to guide or monitor treatment. Performed at [facility], [address] [place], [place] [address]   Glucose, capillary     Status: None   Collection Time: 04/21/19 10:01 PM  Result Value Ref Range   Glucose-Capillary 83 70 - 99 mg/dL   Comment 1 Notify RN   Heparin level (unfractionated)     Status: None   Collection Time: 04/22/19  7:30 AM  Result Value Ref Range   Heparin Unfractionated 0.63 0.30 - 0.70 IU/mL    Comment: (NOTE) If heparin results are below expected values, and patient dosage has  been confirmed, suggest follow up testing of antithrombin III levels. Performed at [facility], [address] [place], [place] [address]   CBC     Status: Abnormal   Collection Time: 04/22/19  7:30 AM  Result Value Ref Range   WBC 9.2 4.0 - 10.5 K/uL   RBC 4.11 3.87 - 5.11 MIL/uL   Hemoglobin 10.2 (L) 12.0 -  15.0 g/dL   HCT 33.4 (L) 36.0 - 46.0 %   MCV 81.3 80.0 - 100.0 fL   MCH 24.8 (L) 26.0 - 34.0 pg   MCHC 30.5 30.0 - 36.0 g/dL   RDW 16.1 (H) 11.5 - 15.5 %   Platelets 83 (L) 150 - 400 K/uL    Comment: Immature Platelet Fraction may be clinically indicated, consider ordering this additional test [id number]    nRBC 0.0 0.0 - 0.2 %    Comment: Performed at [facility], [address] [place], [place] [address]  HIV antibody (Routine Testing)     Status: None   Collection Time: 04/22/19  7:30 AM  Result Value Ref Range   HIV Screen 4th Generation wRfx Non Reactive Non Reactive    Comment: (NOTE) Performed At: [facility] [place], [place] [id number] Rush Farmer MD RW:[id number]   Basic metabolic panel     Status: Abnormal   Collection Time: 04/22/19  7:30 AM  Result Value Ref Range    Sodium 139 135 - 145 mmol/L   Potassium 4.4 3.5 - 5.1 mmol/L   Chloride 109 98 - 111 mmol/L   CO2 21 (L) 22 - 32 mmol/L   Glucose, Bld 83 70 - 99 mg/dL   BUN 15 8 - 23 mg/dL   Creatinine, Ser 1.04 (H) 0.44 - 1.00 mg/dL   Calcium 8.9 8.9 - 10.3 mg/dL   GFR calc non Af Amer 57 (L) >60 mL/min   GFR calc Af Amer >60 >60 mL/min   Anion gap 9 5 - 15    Comment: Performed at [facility], [address] [place], [place] [address]  Hemoglobin A1c     Status: Abnormal   Collection Time: 04/22/19  7:30 AM  Result Value Ref Range   Hgb A1c MFr Bld 6.0 (H) 4.8 - 5.6 %    Comment: (NOTE) Pre diabetes:          5.7%-6.4% Diabetes:              >6.4% Glycemic control for   <7.0% adults with diabetes    Mean Plasma Glucose 125.5 mg/dL    Comment: Performed at [facility], [place] [address] [place], [place] [address]  Magnesium     Status: None   Collection Time: 04/22/19  7:30 AM  Result Value Ref Range   Magnesium 2.1 1.7 - 2.4 mg/dL    Comment: Performed at [facility], [address] [place], [place] [address]  Glucose, capillary     Status: None   Collection Time: 04/22/19  7:36 AM  Result Value Ref Range   Glucose-Capillary 82 70 - 99 mg/dL   Comment 1 Notify RN   Glucose, capillary     Status: None   Collection Time: 04/22/19 11:46 AM  Result Value Ref Range   Glucose-Capillary 86 70 - 99 mg/dL   Comment 1 Notify RN   Heparin level (unfractionated)     Status: None   Collection Time: 04/22/19  1:58 PM  Result Value Ref Range   Heparin Unfractionated 0.46 0.30 - 0.70 IU/mL    Comment: (NOTE) If heparin results are below expected values, and patient dosage has  been confirmed, suggest follow up testing of antithrombin III levels. Performed at [facility], [address] [place], [place] [address]   Glucose, capillary     Status: None   Collection Time: 04/22/19  2:29 PM  Result Value Ref Range  Glucose-Capillary 77 70 - 99 mg/dL   Glucose, capillary     Status: None   Collection Time: 04/22/19  4:31 PM  Result Value Ref Range   Glucose-Capillary 96 70 - 99 mg/dL  Glucose, capillary     Status: None   Collection Time: 04/22/19  5:32 PM  Result Value Ref Range   Glucose-Capillary 98 70 - 99 mg/dL   Comment 1 Notify RN   Glucose, capillary     Status: Abnormal   Collection Time: 04/22/19  9:11 PM  Result Value Ref Range   Glucose-Capillary 103 (H) 70 - 99 mg/dL   Comment 1 Notify RN   CBC     Status: Abnormal   Collection Time: 04/23/19  5:44 AM  Result Value Ref Range   WBC 9.0 4.0 - 10.5 K/uL   RBC 3.39 (L) 3.87 - 5.11 MIL/uL   Hemoglobin 8.4 (L) 12.0 - 15.0 g/dL   HCT 27.6 (L) 36.0 - 46.0 %   MCV 81.4 80.0 - 100.0 fL   MCH 24.8 (L) 26.0 - 34.0 pg   MCHC 30.4 30.0 - 36.0 g/dL   RDW 16.3 (H) 11.5 - 15.5 %   Platelets 70 (L) 150 - 400 K/uL    Comment: Immature Platelet Fraction may be clinically indicated, consider ordering this additional test [id number]    nRBC 0.0 0.0 - 0.2 %    Comment: Performed at [facility], [address] [place], [place] [address]  Heparin level (unfractionated)     Status: None   Collection Time: 04/23/19  5:44 AM  Result Value Ref Range   Heparin Unfractionated 0.56 0.30 - 0.70 IU/mL    Comment: (NOTE) If heparin results are below expected values, and patient dosage has  been confirmed, suggest follow up testing of antithrombin III levels. Performed at [facility], [address] [place], [place] [address]   Glucose, capillary     Status: None   Collection Time: 04/23/19  7:40 AM  Result Value Ref Range   Glucose-Capillary 79 70 - 99 mg/dL  Glucose, capillary     Status: None   Collection Time: 04/23/19 12:30 PM  Result Value Ref Range   Glucose-Capillary 85 70 - 99 mg/dL   Comment 1 Notify RN   Glucose, capillary     Status: None   Collection Time: 04/23/19  4:42 PM  Result Value Ref Range   Glucose-Capillary 90 70 - 99 mg/dL   Comment 1  Notify RN   Glucose, capillary     Status: Abnormal   Collection Time: 04/23/19  9:00 PM  Result Value Ref Range   Glucose-Capillary 135 (H) 70 - 99 mg/dL  CBC     Status: Abnormal   Collection Time: 04/24/19  5:33 AM  Result Value Ref Range   WBC 8.1 4.0 - 10.5 K/uL   RBC 3.11 (L) 3.87 - 5.11 MIL/uL   Hemoglobin 7.7 (L) 12.0 - 15.0 g/dL   HCT 26.1 (L) 36.0 - 46.0 %   MCV 83.9 80.0 - 100.0 fL   MCH 24.8 (L) 26.0 - 34.0 pg   MCHC 29.5 (L) 30.0 - 36.0 g/dL   RDW 16.2 (H) 11.5 - 15.5 %   Platelets 73 (L) 150 - 400 K/uL    Comment: Immature Platelet Fraction may be clinically indicated, consider ordering this additional test [id number] CONSISTENT WITH PREVIOUS RESULT    nRBC 0.0 0.0 - 0.2 %    Comment: Performed at [facility], [address] [place], [place] [address]  Glucose, capillary     Status: None   Collection Time: 04/24/19  7:42 AM  Result Value Ref Range   Glucose-Capillary 89 70 - 99 mg/dL   Comment 1 Notify RN   Vitamin B12     Status: None   Collection Time: 04/24/19  8:41 AM  Result Value Ref Range   Vitamin B-12 516 180 - 914 pg/mL    Comment: (NOTE) This assay is not validated for testing neonatal or myeloproliferative syndrome specimens for Vitamin B12 levels. Performed at [facility], [place] [address] [place], [place] [address]   Folate     Status: None   Collection Time: 04/24/19  8:41 AM  Result Value Ref Range   Folate 35.0 >5.9 ng/mL    Comment: Performed at [facility], [address] [place], [place] [address]  Iron and TIBC     Status: Abnormal   Collection Time: 04/24/19  8:41 AM  Result Value Ref Range   Iron 33 28 - 170 ug/dL   TIBC 341 250 - 450 ug/dL   Saturation Ratios 10 (L) 10.4 - 31.8 %   UIBC 308 ug/dL    Comment: Performed at [facility], [address] [place], [place] [address]  Ferritin     Status: None   Collection Time: 04/24/19  8:41 AM  Result Value Ref Range   Ferritin 17 11 - 307  ng/mL    Comment: Performed at [facility], [address] [name], [place] [address]  Reticulocytes     Status: Abnormal   Collection Time: 04/24/19  8:41 AM  Result Value Ref Range   Retic Ct Pct 1.8 0.4 - 3.1 %   RBC. 3.43 (L) 3.87 - 5.11 MIL/uL   Retic Count, Absolute 62.4 19.0 - 186.0 K/uL   Immature Retic Fract 23.4 (H) 2.3 - 15.9 %    Comment: Performed at [facility], [address] [place], [place] [address]  Glucose, capillary     Status: None   Collection Time: 04/24/19 11:16 AM  Result Value Ref Range   Glucose-Capillary 95 70 - 99 mg/dL   Comment 1 Notify RN   Glucose, capillary     Status: Abnormal   Collection Time: 04/24/19  5:58 PM  Result Value Ref Range   Glucose-Capillary 153 (H) 70 - 99 mg/dL  Glucose, capillary     Status: None   Collection Time: 04/24/19  9:39 PM  Result Value Ref Range   Glucose-Capillary 91 70 - 99 mg/dL   Comment 1 Notify RN   CBC     Status: Abnormal   Collection Time: 04/25/19  4:17 AM  Result Value Ref Range   WBC 8.4 4.0 - 10.5 K/uL   RBC 3.10 (L) 3.87 - 5.11 MIL/uL   Hemoglobin 7.7 (L) 12.0 - 15.0 g/dL   HCT 25.3 (L) 36.0 - 46.0 %   MCV 81.6 80.0 - 100.0 fL   MCH 24.8 (L) 26.0 - 34.0 pg   MCHC 30.4 30.0 - 36.0 g/dL   RDW 16.3 (H) 11.5 - 15.5 %   Platelets 72 (L) 150 - 400 K/uL    Comment: Immature Platelet Fraction may be clinically indicated, consider ordering this additional test [id number]    nRBC 0.0 0.0 - 0.2 %    Comment: Performed at [facility], [address] [place], [place] [address]  Glucose, capillary     Status: None  Collection Time: 04/25/19  7:43 AM  Result Value Ref Range   Glucose-Capillary 85 70 - 99 mg/dL  Glucose, capillary     Status: None   Collection Time: 04/25/19 11:49 AM  Result Value Ref Range   Glucose-Capillary 75 70 - 99 mg/dL  Beta-2-glycoprotein i abs, IgG/M/A     Status: Abnormal   Collection Time: 06/21/19  2:36 PM  Result Value Ref Range    Beta-2 Glyco I IgG 48 (H) 0 - 20 GPI IgG units    Comment: (NOTE) The reference interval reflects a 3SD or 99th percentile interval, which is thought to represent a potentially clinically significant result in accordance with the International Consensus Statement on the classification criteria for definitive antiphospholipid syndrome (APS). J Thromb Haem 2006;4:295-306.    Beta-2-Glycoprotein I IgM <9 0 - 32 GPI IgM units    Comment: (NOTE) The reference interval reflects a 3SD or 99th percentile interval, which is thought to represent a potentially clinically significant result in accordance with the International Consensus Statement on the classification criteria for definitive antiphospholipid syndrome (APS). J Thromb Haem 2006;4:295-306. Performed At: [facility] [place], [place] [id number] Rush Farmer MD [id number]    Beta-2-Glycoprotein I IgA 22 0 - 25 GPI IgA units    Comment: (NOTE) The reference interval reflects a 3SD or 99th percentile interval, which is thought to represent a potentially clinically significant result in accordance with the International Consensus Statement on the classification criteria for definitive antiphospholipid syndrome (APS). J Thromb Haem 2006;4:295-306.   Cardiolipin antibodies, IgM+IgG     Status: Abnormal   Collection Time: 06/21/19  2:36 PM  Result Value Ref Range   Anticardiolipin IgG 74 (H) 0 - 14 GPL U/mL    Comment: (NOTE)                          Negative:              <15                          Indeterminate:     15 - 20                          Low-Med Positive: >20 - 80                          High Positive:         >80    Anticardiolipin IgM 15 (H) 0 - 12 MPL U/mL    Comment: (NOTE)                          Negative:              <13                          Indeterminate:     13 - 20                          Low-Med Positive: >20 - 80                          High Positive:         >80  Performed  At: [facility] [place], [place] [id number] Rush Farmer MD [id number]     Radiology No results found.  Assessment/Plan Diabetes (HCC) blood glucose control important in reducing the progression of atherosclerotic disease. Also, involved in wound healing. On appropriate medications.   Essential hypertension blood pressure control important in reducing the progression of atherosclerotic disease. On appropriate oral medications.  DVT (deep venous thrombosis) (HCC) Duplex today shows only a small amount of chronic appearing DVT in the peroneal and popliteal vein with no other significant DVT present in the right lower extremity.  This is a marked improvement from her previous studies. At this point, we can go ahead and remove her IVC filter.  I discussed the risks and benefits of removal.  I discussed the procedure in detail.  She will continue anticoagulation and does not need to hold this around the time of the procedure.    Leotis Pain, MD  06/28/2019 2:32 PM    This note was created with Dragon medical transcription system.  Any errors from dictation are purely unintentional

## 2019-06-28 NOTE — Assessment & Plan Note (Signed)
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  This is a marked improvement from her previous studies. At this point, we can go ahead and remove her IVC filter.  I discussed the risks and benefits of removal.  I discussed the procedure in detail.  She will continue anticoagulation and does not need to hold this around the time of the procedure.

## 2019-06-29 ENCOUNTER — Telehealth (INDEPENDENT_AMBULATORY_CARE_PROVIDER_SITE_OTHER): Payer: Self-pay | Admitting: Vascular Surgery

## 2019-06-29 LAB — LUPUS ANTICOAGULANT
DRVVT: >180 s — ABNORMAL HIGH (ref 0.0–47.0)
PTT Lupus Anticoagulant: 63.7 s — ABNORMAL HIGH (ref 0.0–51.9)
Thrombin Time: 17 s (ref 0.0–23.0)
dPT Confirm Ratio: 1.2 Ratio (ref 0.00–1.40)
dPT: 83.8 s — ABNORMAL HIGH (ref 0.0–55.0)

## 2019-06-29 LAB — PTT-LA MIX: PTT-LA Mix: 55 s — ABNORMAL HIGH (ref 0.0–48.9)

## 2019-06-29 LAB — DRVVT CONFIRM: dRVVT Confirm: >2.1 ratio — ABNORMAL HIGH (ref 0.8–1.2)

## 2019-06-29 LAB — DRVVT MIX: dRVVT Mix: 108.2 s — ABNORMAL HIGH (ref 0.0–47.0)

## 2019-06-29 LAB — HEXAGONAL PHASE PHOSPHOLIPID: Hexagonal Phase Phospholipid: 22 s — ABNORMAL HIGH (ref 0–11)

## 2019-06-29 NOTE — Telephone Encounter (Signed)
Patient scheduled for IVC filter removal on 07/21/19 arrival at [facility] 730am. Patient is aware NPO after midnight 07/20/19 and to stop Metformin 24 hrs before procedure and 48 hrs after. Also aware to stop Eliquis day before and day of procedure. Aware to take all other meds with small sips of water. Patient is aware to have COVID testing on 07/18/19 at [place] from 1230-230pm. Procedure has been scheduled and letter being sent to patient home as apt and information reminder.

## 2019-07-19 ENCOUNTER — Other Ambulatory Visit
Admission: RE | Admit: 2019-07-19 | Discharge: 2019-07-19 | Disposition: A | Discharge status: Home / Self Care | Payer: Medicare Other | Source: Ambulatory Visit | Attending: Vascular Surgery | Admitting: Vascular Surgery

## 2019-07-19 ENCOUNTER — Other Ambulatory Visit: Payer: Self-pay

## 2019-07-19 DIAGNOSIS — Z01812 Encounter for preprocedural laboratory examination: Secondary | ICD-10-CM | POA: Diagnosis not present

## 2019-07-19 DIAGNOSIS — Z20828 Contact with and (suspected) exposure to other viral communicable diseases: Secondary | ICD-10-CM | POA: Insufficient documentation

## 2019-07-20 ENCOUNTER — Other Ambulatory Visit (INDEPENDENT_AMBULATORY_CARE_PROVIDER_SITE_OTHER): Payer: Self-pay | Admitting: Nurse Practitioner

## 2019-07-20 LAB — SARS CORONAVIRUS 2 (TAT 6-24 HRS): SARS Coronavirus 2: NEGATIVE

## 2019-07-21 ENCOUNTER — Other Ambulatory Visit: Payer: Self-pay

## 2019-07-21 ENCOUNTER — Encounter
Admission: RE | Disposition: A | Discharge status: Home / Self Care | Payer: Self-pay | Source: Home / Self Care | Attending: Vascular Surgery

## 2019-07-21 ENCOUNTER — Ambulatory Visit
Admission: RE | Admit: 2019-07-21 | Discharge: 2019-07-21 | Disposition: A | Discharge status: Home / Self Care | Payer: Medicare Other | Attending: Vascular Surgery | Admitting: Vascular Surgery

## 2019-07-21 DIAGNOSIS — J449 Chronic obstructive pulmonary disease, unspecified: Secondary | ICD-10-CM | POA: Diagnosis not present

## 2019-07-21 DIAGNOSIS — I10 Essential (primary) hypertension: Secondary | ICD-10-CM | POA: Diagnosis not present

## 2019-07-21 DIAGNOSIS — Z7984 Long term (current) use of oral hypoglycemic drugs: Secondary | ICD-10-CM | POA: Insufficient documentation

## 2019-07-21 DIAGNOSIS — Z87891 Personal history of nicotine dependence: Secondary | ICD-10-CM | POA: Diagnosis not present

## 2019-07-21 DIAGNOSIS — Z7901 Long term (current) use of anticoagulants: Secondary | ICD-10-CM | POA: Diagnosis not present

## 2019-07-21 DIAGNOSIS — I82401 Acute embolism and thrombosis of unspecified deep veins of right lower extremity: Secondary | ICD-10-CM | POA: Insufficient documentation

## 2019-07-21 DIAGNOSIS — Z90710 Acquired absence of both cervix and uterus: Secondary | ICD-10-CM | POA: Diagnosis not present

## 2019-07-21 DIAGNOSIS — M329 Systemic lupus erythematosus, unspecified: Secondary | ICD-10-CM | POA: Diagnosis not present

## 2019-07-21 DIAGNOSIS — Z8249 Family history of ischemic heart disease and other diseases of the circulatory system: Secondary | ICD-10-CM | POA: Diagnosis not present

## 2019-07-21 DIAGNOSIS — E119 Type 2 diabetes mellitus without complications: Secondary | ICD-10-CM | POA: Diagnosis not present

## 2019-07-21 DIAGNOSIS — Z79899 Other long term (current) drug therapy: Secondary | ICD-10-CM | POA: Insufficient documentation

## 2019-07-21 DIAGNOSIS — Z79890 Hormone replacement therapy (postmenopausal): Secondary | ICD-10-CM | POA: Insufficient documentation

## 2019-07-21 DIAGNOSIS — Z452 Encounter for adjustment and management of vascular access device: Secondary | ICD-10-CM | POA: Insufficient documentation

## 2019-07-21 DIAGNOSIS — I82409 Acute embolism and thrombosis of unspecified deep veins of unspecified lower extremity: Secondary | ICD-10-CM

## 2019-07-21 DIAGNOSIS — M1990 Unspecified osteoarthritis, unspecified site: Secondary | ICD-10-CM | POA: Diagnosis not present

## 2019-07-21 DIAGNOSIS — Z886 Allergy status to analgesic agent status: Secondary | ICD-10-CM | POA: Insufficient documentation

## 2019-07-21 DIAGNOSIS — M109 Gout, unspecified: Secondary | ICD-10-CM | POA: Insufficient documentation

## 2019-07-21 DIAGNOSIS — Z885 Allergy status to narcotic agent status: Secondary | ICD-10-CM | POA: Diagnosis not present

## 2019-07-21 HISTORY — PX: IVC FILTER REMOVAL: CATH118246

## 2019-07-21 LAB — GLUCOSE, CAPILLARY: Glucose-Capillary: 88 mg/dL (ref 70–99)

## 2019-07-21 SURGERY — IVC FILTER REMOVAL
Anesthesia: Moderate Sedation

## 2019-07-21 MED ORDER — MIDAZOLAM HCL 5 MG/5ML IJ SOLN
INTRAMUSCULAR | Status: AC
  Filled 2019-07-21: qty 5

## 2019-07-21 MED ORDER — IODIXANOL 320 MG/ML IV SOLN
INTRAVENOUS | Status: DC | PRN
  Administered 2019-07-21: 15 mL via INTRA_ARTERIAL

## 2019-07-21 MED ORDER — FAMOTIDINE 20 MG PO TABS: 40.0000 mg | ORAL_TABLET | Freq: Once | ORAL | Status: DC | PRN

## 2019-07-21 MED ORDER — FENTANYL CITRATE (PF) 100 MCG/2ML IJ SOLN: 12.5000 ug | Freq: Once | INTRAMUSCULAR | Status: DC | PRN

## 2019-07-21 MED ORDER — SODIUM CHLORIDE 0.9 % IV SOLN
INTRAVENOUS | Status: DC
  Administered 2019-07-21: 1000 mL via INTRAVENOUS

## 2019-07-21 MED ORDER — METHYLPREDNISOLONE SODIUM SUCC 125 MG IJ SOLR: 125.0000 mg | Freq: Once | INTRAMUSCULAR | Status: DC | PRN

## 2019-07-21 MED ORDER — MIDAZOLAM HCL 2 MG/ML PO SYRP: 8.0000 mg | ORAL_SOLUTION | Freq: Once | ORAL | Status: DC | PRN

## 2019-07-21 MED ORDER — MIDAZOLAM HCL 2 MG/2ML IJ SOLN
INTRAMUSCULAR | Status: DC | PRN
  Administered 2019-07-21: 2 mg via INTRAVENOUS

## 2019-07-21 MED ORDER — ONDANSETRON HCL 4 MG/2ML IJ SOLN: 4.0000 mg | Freq: Four times a day (QID) | INTRAMUSCULAR | Status: DC | PRN

## 2019-07-21 MED ORDER — DIPHENHYDRAMINE HCL 50 MG/ML IJ SOLN: 50.0000 mg | Freq: Once | INTRAMUSCULAR | Status: DC | PRN

## 2019-07-21 MED ORDER — CEFAZOLIN SODIUM-DEXTROSE 2-4 GM/100ML-% IV SOLN
2.0000 g | Freq: Once | INTRAVENOUS | Status: AC
  Administered 2019-07-21: 08:00:00 2 g via INTRAVENOUS

## 2019-07-21 MED ORDER — CEFAZOLIN SODIUM-DEXTROSE 2-4 GM/100ML-% IV SOLN
INTRAVENOUS | Status: AC
  Administered 2019-07-21: 2 g via INTRAVENOUS
  Filled 2019-07-21: qty 100

## 2019-07-21 SURGICAL SUPPLY — 6 items
CANNULA 5F STIFF (CANNULA) ×3 IMPLANT
GLIDEWIRE ADV .035X180CM (WIRE) ×3 IMPLANT
PACK ANGIOGRAPHY (CUSTOM PROCEDURE TRAY) ×3 IMPLANT
SET VENACAVA FILTER RETRIEVAL (MISCELLANEOUS) ×3 IMPLANT
TOWEL OR 17X26 4PK STRL BLUE (TOWEL DISPOSABLE) ×3 IMPLANT
WIRE J 3MM .035X145CM (WIRE) ×3 IMPLANT

## 2019-07-21 NOTE — H&P (Signed)
[facility] History & Physical Update  The patient was interviewed and re-examined.  The patient's previous History and Physical has been reviewed and is unchanged.  There is no change in the plan of care. We plan to proceed with the scheduled procedure.  Leotis Pain, MD  07/21/2019, 8:30 AM

## 2019-07-21 NOTE — Op Note (Signed)
[facility]   OPERATIVE NOTE    PRE-OPERATIVE DIAGNOSIS:  1. DVT 2. status post IVC filter placement  POST-OPERATIVE DIAGNOSIS: Same as above  PROCEDURE: 1. Ultrasound guidance for vascular access right jugular vein 2. Catheter placement into inferior vena cava from right jugular vein 3. Inferior venacavogram 4. Retrieval of [place] IVC filter  SURGEON: Leotis Pain, MD  ASSISTANT(S): None  ANESTHESIA: Local with moderate conscious sedation for approximately 20 minutes using 2 mg of Versed  ESTIMATED BLOOD LOSS: 10 cc  CONTRAST:  15 cc  FLUORO TIME:  2.8 minutes  FINDING(S): 1. patent IVC  SPECIMEN(S): IVC filter  INDICATIONS:  Patient is a 63 y.o. female who presents with a previous history of IVC filter placement with venous thrombectomy and thrombolysis previously. Patient has tolerated anticoagulation and now has only chronic appearing DVT and no longer needs this filter. The patient remains on anticoagulation. Risks and benefits were discussed, and informed consent was obtained.  DESCRIPTION: After obtaining full informed written consent, the patient was brought back to the vascular suite and placed supine upon the table.Moderate conscious sedation was administered during a face to face encounter with the patient throughout the procedure with my supervision of the RN administering medicines and monitoring the patient's vital signs, pulse oximetry, telemetry and mental status throughout from the start of the procedure until the patient was taken to the recovery room.  After obtaining adequate anesthesia, the patient was prepped and draped in the standard fashion. The right jugular vein was visualized with ultrasound and found to be widely patent. It was then accessed under direct ultrasound guidance without difficulty with the Seldinger needle and a permanent image was recorded. A J-wire was placed. After skin nick and dilatation, the  retrieval sheath was placed over the wire and advanced into the inferior vena cava. Inferior vena cava was imaged and found to be widely patent on inferior venacavogram. The filter was reasonably straight in its orientation. The retrieval snare was then placed through the sheath and the hook of the filter was snared without difficulty. The sheath was then advanced, and the filter was collapsed and brought into the sheath in its entirety. It was then removed from the body in its entirety. The retrieval sheath was then removed. Pressure was held at the access site and sterile dressing was placed. The patient was taken to the recovery room in stable condition having tolerated the procedure well.  COMPLICATIONS: None  CONDITION: Stable   Leotis Pain 07/21/2019 8:31 AM  This note was created with Dragon Medical transcription system. Any errors in dictation are purely unintentional.

## 2019-07-22 ENCOUNTER — Telehealth (INDEPENDENT_AMBULATORY_CARE_PROVIDER_SITE_OTHER): Payer: Self-pay

## 2019-07-22 NOTE — Telephone Encounter (Signed)
Patient called wanting to know if she could restart taking her Eliquis after her filter removal on 07/21/2019. Per Dr. Lucky Cowboy it is okay to restart her Eliquis. Patient was informed.

## 2019-08-01 ENCOUNTER — Other Ambulatory Visit: Payer: Self-pay

## 2019-08-01 NOTE — Progress Notes (Signed)
Patient pre screened for office appointment, no questions or concerns today. 

## 2019-08-02 ENCOUNTER — Inpatient Hospital Stay: Payer: Medicare Other

## 2019-08-02 ENCOUNTER — Other Ambulatory Visit: Payer: Self-pay

## 2019-08-02 ENCOUNTER — Inpatient Hospital Stay: Payer: Medicare Other | Attending: Oncology | Admitting: Oncology

## 2019-08-02 VITALS — BP 154/93 | HR 83 | Temp 98.0°F | Resp 18 | Wt 139.0 lb

## 2019-08-02 DIAGNOSIS — D509 Iron deficiency anemia, unspecified: Secondary | ICD-10-CM | POA: Insufficient documentation

## 2019-08-02 DIAGNOSIS — Z79899 Other long term (current) drug therapy: Secondary | ICD-10-CM | POA: Diagnosis not present

## 2019-08-02 DIAGNOSIS — I10 Essential (primary) hypertension: Secondary | ICD-10-CM | POA: Diagnosis not present

## 2019-08-02 DIAGNOSIS — E119 Type 2 diabetes mellitus without complications: Secondary | ICD-10-CM | POA: Insufficient documentation

## 2019-08-02 DIAGNOSIS — D696 Thrombocytopenia, unspecified: Secondary | ICD-10-CM | POA: Insufficient documentation

## 2019-08-02 DIAGNOSIS — R5383 Other fatigue: Secondary | ICD-10-CM | POA: Insufficient documentation

## 2019-08-02 DIAGNOSIS — R5381 Other malaise: Secondary | ICD-10-CM | POA: Insufficient documentation

## 2019-08-02 DIAGNOSIS — Z86718 Personal history of other venous thrombosis and embolism: Secondary | ICD-10-CM | POA: Diagnosis not present

## 2019-08-02 DIAGNOSIS — M810 Age-related osteoporosis without current pathological fracture: Secondary | ICD-10-CM | POA: Diagnosis not present

## 2019-08-02 DIAGNOSIS — M329 Systemic lupus erythematosus, unspecified: Secondary | ICD-10-CM | POA: Insufficient documentation

## 2019-08-02 DIAGNOSIS — M797 Fibromyalgia: Secondary | ICD-10-CM | POA: Diagnosis not present

## 2019-08-02 DIAGNOSIS — M1990 Unspecified osteoarthritis, unspecified site: Secondary | ICD-10-CM | POA: Insufficient documentation

## 2019-08-02 DIAGNOSIS — Z87891 Personal history of nicotine dependence: Secondary | ICD-10-CM | POA: Insufficient documentation

## 2019-08-02 DIAGNOSIS — J449 Chronic obstructive pulmonary disease, unspecified: Secondary | ICD-10-CM | POA: Insufficient documentation

## 2019-08-02 DIAGNOSIS — I82401 Acute embolism and thrombosis of unspecified deep veins of right lower extremity: Secondary | ICD-10-CM

## 2019-08-02 LAB — COMPREHENSIVE METABOLIC PANEL
ALT: 8 U/L (ref 0–44)
AST: 14 U/L — ABNORMAL LOW (ref 15–41)
Albumin: 3.8 g/dL (ref 3.5–5.0)
Alkaline Phosphatase: 57 U/L (ref 38–126)
Anion gap: 8 (ref 5–15)
BUN: 13 mg/dL (ref 8–23)
CO2: 26 mmol/L (ref 22–32)
Calcium: 9 mg/dL (ref 8.9–10.3)
Chloride: 106 mmol/L (ref 98–111)
Creatinine, Ser: 0.96 mg/dL (ref 0.44–1.00)
GFR calc Af Amer: >60 mL/min (ref >60)
GFR calc non Af Amer: >60 mL/min (ref >60)
Glucose, Bld: 89 mg/dL (ref 70–99)
Potassium: 3.7 mmol/L (ref 3.5–5.1)
Sodium: 140 mmol/L (ref 135–145)
Total Bilirubin: 0.5 mg/dL (ref 0.3–1.2)
Total Protein: 6.8 g/dL (ref 6.5–8.1)

## 2019-08-02 LAB — IRON AND TIBC
Iron: 47 ug/dL (ref 28–170)
Saturation Ratios: 15 % (ref 10.4–31.8)
TIBC: 323 ug/dL (ref 250–450)
UIBC: 276 ug/dL

## 2019-08-02 LAB — CBC WITH DIFFERENTIAL/PLATELET
Abs Immature Granulocytes: 0.03 10*3/uL (ref 0.00–0.07)
Basophils Absolute: 0.1 10*3/uL (ref 0.0–0.1)
Basophils Relative: 1 %
Eosinophils Absolute: 0.2 10*3/uL (ref 0.0–0.5)
Eosinophils Relative: 2 %
HCT: 36.8 % (ref 36.0–46.0)
Hemoglobin: 11.7 g/dL — ABNORMAL LOW (ref 12.0–15.0)
Immature Granulocytes: 0 %
Lymphocytes Relative: 21 %
Lymphs Abs: 1.7 10*3/uL (ref 0.7–4.0)
MCH: 27.3 pg (ref 26.0–34.0)
MCHC: 31.8 g/dL (ref 30.0–36.0)
MCV: 85.8 fL (ref 80.0–100.0)
Monocytes Absolute: 0.5 10*3/uL (ref 0.1–1.0)
Monocytes Relative: 6 %
Neutro Abs: 5.9 10*3/uL (ref 1.7–7.7)
Neutrophils Relative %: 70 %
Platelets: 52 10*3/uL — ABNORMAL LOW (ref 150–400)
RBC: 4.29 MIL/uL (ref 3.87–5.11)
RDW: 15 % (ref 11.5–15.5)
WBC: 8.5 10*3/uL (ref 4.0–10.5)
nRBC: 0 % (ref 0.0–0.2)

## 2019-08-02 LAB — PATHOLOGIST SMEAR REVIEW

## 2019-08-02 LAB — FERRITIN: Ferritin: 32 ng/mL (ref 11–307)

## 2019-08-02 LAB — VITAMIN B12: Vitamin B-12: 316 pg/mL (ref 180–914)

## 2019-08-02 LAB — PROTIME-INR
INR: 1.3 — ABNORMAL HIGH (ref 0.8–1.2)
Prothrombin Time: 15.9 seconds — ABNORMAL HIGH (ref 11.4–15.2)

## 2019-08-02 LAB — APTT: aPTT: 46 seconds — ABNORMAL HIGH (ref 24–36)

## 2019-08-02 LAB — FOLATE: Folate: 14 ng/mL (ref >5.9)

## 2019-08-02 LAB — LACTATE DEHYDROGENASE: LDH: 266 U/L — ABNORMAL HIGH (ref 98–192)

## 2019-08-04 ENCOUNTER — Encounter: Payer: Self-pay | Admitting: Oncology

## 2019-08-04 NOTE — Progress Notes (Signed)
Hematology/Oncology Consult note [facility]  Telephone:([phone] [phone]  Patient Care Team: Sharyne Peach, MD as PCP - General (Family Medicine)   Name of the patient: Alison Lopez  [id number]  10-07-1956   Date of visit: 08/04/19  Diagnosis- 1. Right lower extremity DVT 2. Iron deficiency anemia  Chief complaint/ Reason for visit- discuss hypercoagulable work up  Heme/Onc history: Patient is a 63 yr old female with prior h/o LLE DVT back in 1993 after an episode of pneumonia. Patient was started on coumadin back then and stayed on it. On 04/20/19 patient noticed pain and tightness in her RLE. She came to the ER and USG showed extensive right calf, femoral and popliteal DVT. INR was therapeutic at 2.2 on this day. Patient was switched to eliquis. Also seen by vascular surgery and underwent thrombectomy/ PTA and IVC filter placement. Patient reports intermittent headaches and nausea since she started taking eliquis. RLE swelling is getting better. She denies any DVT/PE in the 27 years she was on coumadin no pregnancy losses. No family h/o thrombosis. She has 2 adult children with no h/o DVT  At the time of DVT patient also noted to have iron deficiency anemia and thrombocytopenia. Platelet counts have been normal in 2018. Recent H/H was 7.7/25.3 and platelet count was 72. She did receive 1 dose of IV iron and currently she is taking oral iron. She denies any blood in stools. Stools have been dark since she started taking po iron  Interval history- she feels well. She does endorse chronic fatigue. She denies any complaints  ECOG PS- 1 Pain scale- 0   Review of systems- Review of Systems  Constitutional: Positive for malaise/fatigue. Negative for chills, fever and weight loss.  HENT: Negative for congestion, ear discharge and nosebleeds.   Eyes: Negative for blurred vision.  Respiratory: Negative for cough, hemoptysis, sputum production,  shortness of breath and wheezing.   Cardiovascular: Negative for chest pain, palpitations, orthopnea and claudication.  Gastrointestinal: Negative for abdominal pain, blood in stool, constipation, diarrhea, heartburn, melena, nausea and vomiting.  Genitourinary: Negative for dysuria, flank pain, frequency, hematuria and urgency.  Musculoskeletal: Negative for back pain, joint pain and myalgias.  Skin: Negative for rash.  Neurological: Negative for dizziness, tingling, focal weakness, seizures, weakness and headaches.  Endo/Heme/Allergies: Does not bruise/bleed easily.  Psychiatric/Behavioral: Negative for depression and suicidal ideas. The patient does not have insomnia.       Allergies  Allergen Reactions  . Morphine And Related Anaphylaxis  . Aspirin Other (See Comments)    Patient on blood thinners and was told not to take  . Benadryl [Diphenhydramine Hcl] Hives  . Codeine Other (See Comments)    AMS     Past Medical History:  Diagnosis Date  . Arthritis   . COPD (chronic obstructive pulmonary disease) ([place])   . Depression   . Diabetes mellitus without complication ([place])   . DVT (deep venous thrombosis) ([place])   . Fibromyalgia   . Hypertension   . Lupus ([place])   . Osteoporosis      Past Surgical History:  Procedure Laterality Date  . ABDOMINAL HYSTERECTOMY    . IVC FILTER REMOVAL N/A 07/21/2019   Procedure: IVC FILTER REMOVAL;  Surgeon: Algernon Huxley, MD;  Location: [facility];  Service: Cardiovascular;  Laterality: N/A;  . LOWER EXTREMITY INTERVENTION Right 04/22/2019   Procedure: IVC Filter Insertion with Right Lower Extremity Venous Lysis;  Surgeon: Algernon Huxley,  MD;  Location: [facility];  Service: Cardiovascular;  Laterality: Right;  . OOPHORECTOMY      Social History   Socioeconomic History  . Marital status: Divorced    Spouse name: Not on file  . Number of children: Not on file  . Years of education: Not on file  . Highest education  level: Not on file  Occupational History  . Not on file  Social Needs  . Financial resource strain: Not on file  . Food insecurity    Worry: Not on file    Inability: Not on file  . Transportation needs    Medical: Not on file    Non-medical: Not on file  Tobacco Use  . Smoking status: Former Smoker    Packs/day: 1.00    Years: 17.00    Pack years: 17.00    Types: Cigarettes    Quit date: 2002    Years since quitting: 18.7  . Smokeless tobacco: Never Used  Substance and Sexual Activity  . Alcohol use: Not Currently  . Drug use: Never  . Sexual activity: Not on file  Lifestyle  . Physical activity    Days per week: Not on file    Minutes per session: Not on file  . Stress: Not on file  Relationships  . Social Herbalist on phone: Not on file    Gets together: Not on file    Attends religious service: Not on file    Active member of club or organization: Not on file    Attends meetings of clubs or organizations: Not on file    Relationship status: Not on file  . Intimate partner violence    Fear of current or ex partner: Not on file    Emotionally abused: Not on file    Physically abused: Not on file    Forced sexual activity: Not on file  Other Topics Concern  . Not on file  Social History Narrative  . Not on file    Family History  Problem Relation Age of Onset  . Stomach cancer Mother   . Heart attack Father   . Breast cancer Neg Hx      Current Outpatient Medications:  .  acetaminophen (TYLENOL) 650 MG CR tablet, Take 650 mg by mouth every 8 (eight) hours as needed for pain., Disp: , Rfl:  .  albuterol (PROVENTIL) (2.5 MG/3ML) 0.083% nebulizer solution, Take 2.5 mg by nebulization every 4 (four) hours as needed for wheezing or shortness of breath. , Disp: , Rfl:  .  albuterol (VENTOLIN HFA) 108 (90 Base) MCG/ACT inhaler, Inhale 2 puffs into the lungs every 4 (four) hours as needed for wheezing or shortness of breath., Disp: , Rfl:  .  apixaban  (ELIQUIS) 5 MG TABS tablet, Take 2 tablets (10 mg total) by mouth 2 (two) times daily for 5 days., Disp: 20 tablet, Rfl: 0 .  apixaban (ELIQUIS) 5 MG TABS tablet, Take 1 tablet (5 mg total) by mouth 2 (two) times daily., Disp: 60 tablet, Rfl: 2 .  ascorbic acid (VITAMIN C) 100 MG tablet, Take 1,000 mg by mouth daily., Disp: , Rfl:  .  buPROPion (WELLBUTRIN SR) 150 MG 12 hr tablet, Take 150 mg by mouth daily., Disp: , Rfl:  .  cyclobenzaprine (FLEXERIL) 5 MG tablet, Take 5 mg by mouth 3 (three) times daily as needed for muscle spasms., Disp: , Rfl:  .  ergocalciferol (VITAMIN D2) 1.25 MG (50000 UT) capsule,  Take 50,000 Units by mouth every Friday. , Disp: , Rfl:  .  erythromycin ophthalmic ointment, Place 1 application into both eyes at bedtime., Disp: , Rfl:  .  ferrous sulfate 325 (65 FE) MG tablet, Take by mouth., Disp: , Rfl:  .  lansoprazole (PREVACID) 30 MG capsule, Take 30 mg by mouth daily., Disp: , Rfl:  .  levothyroxine (SYNTHROID, LEVOTHROID) 88 MCG tablet, Take 88 mcg by mouth daily before breakfast., Disp: , Rfl:  .  losartan (COZAAR) 50 MG tablet, Take 50 mg by mouth daily., Disp: , Rfl:  .  metFORMIN (GLUCOPHAGE) 500 MG tablet, Take 500 mg by mouth daily. , Disp: , Rfl:  .  mometasone (NASONEX) 50 MCG/ACT nasal spray, Place 2 sprays into the nose daily., Disp: , Rfl:  .  predniSONE (DELTASONE) 10 MG tablet, Take 10 mg by mouth daily with breakfast., Disp: , Rfl:  .  simvastatin (ZOCOR) 40 MG tablet, Take 40 mg by mouth daily., Disp: , Rfl:   Physical exam:  Vitals:   08/02/19 1327  BP: (!) 154/93  Pulse: 83  Resp: 18  Temp: 98 F (36.7 C)  SpO2: 98%  Weight: 139 lb (63 kg)   Physical Exam Constitutional:      General: She is not in acute distress. HENT:     Head: Normocephalic and atraumatic.  Eyes:     Pupils: Pupils are equal, round, and reactive to light.  Neck:     Musculoskeletal: Normal range of motion.  Cardiovascular:     Rate and Rhythm: Normal rate and  regular rhythm.     Heart sounds: Normal heart sounds.  Pulmonary:     Effort: Pulmonary effort is normal.     Breath sounds: Normal breath sounds.  Abdominal:     General: Bowel sounds are normal.     Palpations: Abdomen is soft.  Skin:    General: Skin is warm and dry.  Neurological:     Mental Status: She is alert and oriented to person, place, and time.      CMP Latest Ref Rng & Units 08/02/2019  Glucose 70 - 99 mg/dL 89  BUN 8 - 23 mg/dL 13  Creatinine 0.44 - 1.00 mg/dL 0.96  Sodium 135 - 145 mmol/L 140  Potassium 3.5 - 5.1 mmol/L 3.7  Chloride 98 - 111 mmol/L 106  CO2 22 - 32 mmol/L 26  Calcium 8.9 - 10.3 mg/dL 9.0  Total Protein 6.5 - 8.1 g/dL 6.8  Total Bilirubin 0.3 - 1.2 mg/dL 0.5  Alkaline Phos 38 - 126 U/L 57  AST 15 - 41 U/L 14(L)  ALT 0 - 44 U/L 8   CBC Latest Ref Rng & Units 08/02/2019  WBC 4.0 - 10.5 K/uL 8.5  Hemoglobin 12.0 - 15.0 g/dL 11.7(L)  Hematocrit 36.0 - 46.0 % 36.8  Platelets 150 - 400 K/uL 52(L)     Assessment and plan- Patient is a 63 y.o. female with following issues:  1.  Right lower extremity DVT: Patient had a second episode of DVT while she was on Coumadin and was therapeutic.  She has been on Coumadin for about 27 years before her second DVT developed.I did do antiphospholipid antibody syndrome work-up and she did have elevated IgG anticardiolipin antibody of 74.  Beta-2 IgG glycoprotein was also elevated at 48.  Lupus anticoagulant testing also showed presence of lupus anticoagulant.  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  I would like to repeat another set of values  in 12 weeks sometime in November 2020 to confirm if she has APS.  Ideally she should be on Coumadin for APS as it has been shown to be better than newer anticoagulants.  However her second DVT was when she was therapeutic on Coumadin and therefore I will keep her on Eliquis regardless of testing results.  2.  Thrombocytopenia:Patient noted to have thrombocytopenia since  June of this year.  On 04/28/2019 her platelet count was 101 and drifted down to 62 in July 2020.  Today her platelet count is 52.  White count and hemoglobin are normal.  Patients with antiphospholipid antibody syndrome can have baseline thrombocytopenia which is typically mild in between 100-140 and her platelet counts are lower than expected for that range.  She does have a diagnosis of pre-existing lupus as well which can contribute to thrombocytopenia too.  However given that she is on anticoagulation I would like to keep her platelet counts more than 50 and if her platelet counts drop further I will consider giving her empiric steroids for ITP  Repeat CBC with differential in 2 weeks in 4 weeks and I will see her back in 4 weeks   Visit Diagnosis 1. Thrombocytopenia ([place])   2. History of DVT of lower extremity      Dr. Randa Evens, MD, MPH [facility] at [facility] [id number] 08/04/2019 11:48 AM

## 2019-08-16 ENCOUNTER — Inpatient Hospital Stay: Payer: Medicare Other | Attending: Oncology

## 2019-08-16 ENCOUNTER — Other Ambulatory Visit: Payer: Self-pay

## 2019-08-16 DIAGNOSIS — I10 Essential (primary) hypertension: Secondary | ICD-10-CM | POA: Diagnosis not present

## 2019-08-16 DIAGNOSIS — Z7951 Long term (current) use of inhaled steroids: Secondary | ICD-10-CM | POA: Insufficient documentation

## 2019-08-16 DIAGNOSIS — Z7901 Long term (current) use of anticoagulants: Secondary | ICD-10-CM | POA: Insufficient documentation

## 2019-08-16 DIAGNOSIS — J449 Chronic obstructive pulmonary disease, unspecified: Secondary | ICD-10-CM | POA: Diagnosis not present

## 2019-08-16 DIAGNOSIS — D696 Thrombocytopenia, unspecified: Secondary | ICD-10-CM

## 2019-08-16 DIAGNOSIS — M810 Age-related osteoporosis without current pathological fracture: Secondary | ICD-10-CM | POA: Diagnosis not present

## 2019-08-16 DIAGNOSIS — R5383 Other fatigue: Secondary | ICD-10-CM | POA: Diagnosis not present

## 2019-08-16 DIAGNOSIS — I82401 Acute embolism and thrombosis of unspecified deep veins of right lower extremity: Secondary | ICD-10-CM | POA: Diagnosis not present

## 2019-08-16 DIAGNOSIS — D693 Immune thrombocytopenic purpura: Secondary | ICD-10-CM | POA: Insufficient documentation

## 2019-08-16 DIAGNOSIS — D509 Iron deficiency anemia, unspecified: Secondary | ICD-10-CM | POA: Insufficient documentation

## 2019-08-16 DIAGNOSIS — M329 Systemic lupus erythematosus, unspecified: Secondary | ICD-10-CM | POA: Diagnosis not present

## 2019-08-16 DIAGNOSIS — Z7984 Long term (current) use of oral hypoglycemic drugs: Secondary | ICD-10-CM | POA: Diagnosis not present

## 2019-08-16 DIAGNOSIS — Z8701 Personal history of pneumonia (recurrent): Secondary | ICD-10-CM | POA: Insufficient documentation

## 2019-08-16 DIAGNOSIS — M1990 Unspecified osteoarthritis, unspecified site: Secondary | ICD-10-CM | POA: Diagnosis not present

## 2019-08-16 DIAGNOSIS — R5381 Other malaise: Secondary | ICD-10-CM | POA: Diagnosis not present

## 2019-08-16 DIAGNOSIS — Z79899 Other long term (current) drug therapy: Secondary | ICD-10-CM | POA: Diagnosis not present

## 2019-08-16 DIAGNOSIS — E119 Type 2 diabetes mellitus without complications: Secondary | ICD-10-CM | POA: Diagnosis not present

## 2019-08-16 DIAGNOSIS — Z87891 Personal history of nicotine dependence: Secondary | ICD-10-CM | POA: Insufficient documentation

## 2019-08-16 DIAGNOSIS — M797 Fibromyalgia: Secondary | ICD-10-CM | POA: Insufficient documentation

## 2019-08-16 DIAGNOSIS — R634 Abnormal weight loss: Secondary | ICD-10-CM | POA: Diagnosis not present

## 2019-08-16 DIAGNOSIS — Z86718 Personal history of other venous thrombosis and embolism: Secondary | ICD-10-CM

## 2019-08-16 LAB — CBC WITH DIFFERENTIAL/PLATELET
Abs Immature Granulocytes: 0.03 10*3/uL (ref 0.00–0.07)
Basophils Absolute: 0.1 10*3/uL (ref 0.0–0.1)
Basophils Relative: 1 %
Eosinophils Absolute: 0.2 10*3/uL (ref 0.0–0.5)
Eosinophils Relative: 2 %
HCT: 36.7 % (ref 36.0–46.0)
Hemoglobin: 11.7 g/dL — ABNORMAL LOW (ref 12.0–15.0)
Immature Granulocytes: 0 %
Lymphocytes Relative: 14 %
Lymphs Abs: 1.2 10*3/uL (ref 0.7–4.0)
MCH: 27.4 pg (ref 26.0–34.0)
MCHC: 31.9 g/dL (ref 30.0–36.0)
MCV: 85.9 fL (ref 80.0–100.0)
Monocytes Absolute: 0.6 10*3/uL (ref 0.1–1.0)
Monocytes Relative: 7 %
Neutro Abs: 6.8 10*3/uL (ref 1.7–7.7)
Neutrophils Relative %: 76 %
Platelets: 50 10*3/uL — ABNORMAL LOW (ref 150–400)
RBC: 4.27 MIL/uL (ref 3.87–5.11)
RDW: 15.1 % (ref 11.5–15.5)
WBC: 9 10*3/uL (ref 4.0–10.5)
nRBC: 0 % (ref 0.0–0.2)

## 2019-08-19 ENCOUNTER — Encounter: Payer: Self-pay | Admitting: Oncology

## 2019-08-19 ENCOUNTER — Other Ambulatory Visit: Payer: Self-pay | Admitting: *Deleted

## 2019-08-19 DIAGNOSIS — D6861 Antiphospholipid syndrome: Secondary | ICD-10-CM

## 2019-08-19 NOTE — Progress Notes (Signed)
Patient does not offer any problems today.  

## 2019-08-22 ENCOUNTER — Telehealth: Payer: Self-pay | Admitting: *Deleted

## 2019-08-22 ENCOUNTER — Other Ambulatory Visit: Payer: Self-pay | Admitting: *Deleted

## 2019-08-22 ENCOUNTER — Inpatient Hospital Stay (HOSPITAL_BASED_OUTPATIENT_CLINIC_OR_DEPARTMENT_OTHER): Payer: Medicare Other | Admitting: Oncology

## 2019-08-22 ENCOUNTER — Other Ambulatory Visit: Payer: Self-pay

## 2019-08-22 ENCOUNTER — Inpatient Hospital Stay: Payer: Medicare Other

## 2019-08-22 VITALS — BP 150/88 | HR 80 | Temp 98.2°F | Ht 60.0 in | Wt 139.0 lb

## 2019-08-22 DIAGNOSIS — I82401 Acute embolism and thrombosis of unspecified deep veins of right lower extremity: Secondary | ICD-10-CM

## 2019-08-22 DIAGNOSIS — D696 Thrombocytopenia, unspecified: Secondary | ICD-10-CM

## 2019-08-22 DIAGNOSIS — Z87891 Personal history of nicotine dependence: Secondary | ICD-10-CM

## 2019-08-22 DIAGNOSIS — R634 Abnormal weight loss: Secondary | ICD-10-CM

## 2019-08-22 DIAGNOSIS — D6861 Antiphospholipid syndrome: Secondary | ICD-10-CM

## 2019-08-22 DIAGNOSIS — D509 Iron deficiency anemia, unspecified: Secondary | ICD-10-CM | POA: Diagnosis not present

## 2019-08-22 DIAGNOSIS — Z7901 Long term (current) use of anticoagulants: Secondary | ICD-10-CM | POA: Diagnosis not present

## 2019-08-22 DIAGNOSIS — D693 Immune thrombocytopenic purpura: Secondary | ICD-10-CM | POA: Diagnosis not present

## 2019-08-22 LAB — CBC WITH DIFFERENTIAL/PLATELET
Abs Immature Granulocytes: 0.02 10*3/uL (ref 0.00–0.07)
Basophils Absolute: 0.1 10*3/uL (ref 0.0–0.1)
Basophils Relative: 1 %
Eosinophils Absolute: 0.2 10*3/uL (ref 0.0–0.5)
Eosinophils Relative: 2 %
HCT: 37.4 % (ref 36.0–46.0)
Hemoglobin: 11.8 g/dL — ABNORMAL LOW (ref 12.0–15.0)
Immature Granulocytes: 0 %
Lymphocytes Relative: 18 %
Lymphs Abs: 1.5 10*3/uL (ref 0.7–4.0)
MCH: 27.4 pg (ref 26.0–34.0)
MCHC: 31.6 g/dL (ref 30.0–36.0)
MCV: 87 fL (ref 80.0–100.0)
Monocytes Absolute: 0.6 10*3/uL (ref 0.1–1.0)
Monocytes Relative: 7 %
Neutro Abs: 5.9 10*3/uL (ref 1.7–7.7)
Neutrophils Relative %: 72 %
Platelets: 55 10*3/uL — ABNORMAL LOW (ref 150–400)
RBC: 4.3 MIL/uL (ref 3.87–5.11)
RDW: 15.3 % (ref 11.5–15.5)
WBC: 8.3 10*3/uL (ref 4.0–10.5)
nRBC: 0 % (ref 0.0–0.2)

## 2019-08-22 LAB — IRON AND TIBC
Iron: 45 ug/dL (ref 28–170)
Saturation Ratios: 14 % (ref 10.4–31.8)
TIBC: 328 ug/dL (ref 250–450)
UIBC: 283 ug/dL

## 2019-08-22 LAB — FERRITIN: Ferritin: 40 ng/mL (ref 11–307)

## 2019-08-22 MED ORDER — DEXAMETHASONE 4 MG PO TABS: 40.0000 mg | ORAL_TABLET | Freq: Every day | ORAL | 0 refills | Status: DC

## 2019-08-22 NOTE — Progress Notes (Signed)
Hematology/Oncology Consult note [facility]  [phone] [phone]  Patient Care Team: Sharyne Peach, MD as PCP - General (Family Medicine)   Name of the patient: Alison Lopez  [id number]  09-25-1956   Date of visit: 08/22/19  Diagnosis-  1. Right lower extremity DVT 2. Iron deficiency anemia  Chief complaint/ Reason for visit-follow-up of thrombocytopenia and anticoagulation management for DVT  Heme/Onc history: Patient is a 63 yr old female with prior h/o LLE PROXIMAL DVT back in 1993 after an episode of pneumonia. Patient was started on coumadin back then and stayed on it. On 04/20/19 patient noticed pain and tightness in her RLE. She came to the ER and USG showed extensive right calf, femoral and popliteal DVT. INR was therapeutic at 2.2 on this day. Patient was switched to eliquis. Also seen by vascular surgery and underwent thrombectomy/ PTA and IVC filter placement. Patient reports intermittent headaches and nausea since she started taking eliquis. RLE swelling is getting better. She denies any DVT/PE in the 27 years she was on coumadin no pregnancy losses. No family h/o thrombosis. She has 2 adult children with no h/o DVT  At the time of DVT patient also noted to have iron deficiency anemia and thrombocytopenia. Platelet counts have been normal in 2018. Recent H/H was 7.7/25.3 and platelet count was 72. She did receive 1 dose of IV iron and currently she is taking oral iron. She denies any blood in stools. Stools have been dark since she started taking po iron   Interval history-over the last 2 months she has lost 5 pounds and states that she may have lost 10 pounds over the last 1 year.  She mainly attributes this to her poor dentition.  She has not had a colonoscopy in several years now.  She is due for mammogram this month.  ECOG PS- 1 Pain scale- 0   Review of systems- Review of Systems  Constitutional: Positive for  malaise/fatigue. Negative for chills, fever and weight loss.  HENT: Negative for congestion, ear discharge and nosebleeds.   Eyes: Negative for blurred vision.  Respiratory: Negative for cough, hemoptysis, sputum production, shortness of breath and wheezing.   Cardiovascular: Negative for chest pain, palpitations, orthopnea and claudication.  Gastrointestinal: Negative for abdominal pain, blood in stool, constipation, diarrhea, heartburn, melena, nausea and vomiting.  Genitourinary: Negative for dysuria, flank pain, frequency, hematuria and urgency.  Musculoskeletal: Negative for back pain, joint pain and myalgias.  Skin: Negative for rash.  Neurological: Negative for dizziness, tingling, focal weakness, seizures, weakness and headaches.  Endo/Heme/Allergies: Does not bruise/bleed easily.  Psychiatric/Behavioral: Negative for depression and suicidal ideas. The patient does not have insomnia.        Allergies  Allergen Reactions  . Morphine And Related Anaphylaxis  . Aspirin Other (See Comments)    Patient on blood thinners and was told not to take  . Benadryl [Diphenhydramine Hcl] Hives  . Codeine Other (See Comments)    AMS     Past Medical History:  Diagnosis Date  . Arthritis   . COPD (chronic obstructive pulmonary disease) ([place])   . Depression   . Diabetes mellitus without complication ([place])   . DVT (deep venous thrombosis) ([place])   . Fibromyalgia   . Hypertension   . Lupus ([place])   . Osteoporosis      Past Surgical History:  Procedure Laterality Date  . ABDOMINAL HYSTERECTOMY    . IVC FILTER REMOVAL N/A 07/21/2019  Procedure: IVC FILTER REMOVAL;  Surgeon: Algernon Huxley, MD;  Location: [place] [facility];  Service: Cardiovascular;  Laterality: N/A;  . LOWER EXTREMITY INTERVENTION Right 04/22/2019   Procedure: IVC Filter Insertion with Right Lower Extremity Venous Lysis;  Surgeon: Algernon Huxley, MD;  Location: [place] [facility];  Service: Cardiovascular;  Laterality:  Right;  . OOPHORECTOMY      Social History   Socioeconomic History  . Marital status: Divorced    Spouse name: Not on file  . Number of children: Not on file  . Years of education: Not on file  . Highest education level: Not on file  Occupational History  . Not on file  Social Needs  . Financial resource strain: Not on file  . Food insecurity    Worry: Not on file    Inability: Not on file  . Transportation needs    Medical: Not on file    Non-medical: Not on file  Tobacco Use  . Smoking status: Former Smoker    Packs/day: 1.00    Years: 17.00    Pack years: 17.00    Types: Cigarettes    Quit date: 2002    Years since quitting: 18.7  . Smokeless tobacco: Never Used  Substance and Sexual Activity  . Alcohol use: Not Currently  . Drug use: Never  . Sexual activity: Not on file  Lifestyle  . Physical activity    Days per week: Not on file    Minutes per session: Not on file  . Stress: Not on file  Relationships  . Social Herbalist on phone: Not on file    Gets together: Not on file    Attends religious service: Not on file    Active member of club or organization: Not on file    Attends meetings of clubs or organizations: Not on file    Relationship status: Not on file  . Intimate partner violence    Fear of current or ex partner: Not on file    Emotionally abused: Not on file    Physically abused: Not on file    Forced sexual activity: Not on file  Other Topics Concern  . Not on file  Social History Narrative  . Not on file    Family History  Problem Relation Age of Onset  . Stomach cancer Mother   . Heart attack Father   . Breast cancer Neg Hx      Current Outpatient Medications:  .  acetaminophen (TYLENOL) 650 MG CR tablet, Take 650 mg by mouth every 8 (eight) hours as needed for pain., Disp: , Rfl:  .  albuterol (PROVENTIL) (2.5 MG/3ML) 0.083% nebulizer solution, Take 2.5 mg by nebulization every 4 (four) hours as needed for wheezing or  shortness of breath. , Disp: , Rfl:  .  albuterol (VENTOLIN HFA) 108 (90 Base) MCG/ACT inhaler, Inhale 2 puffs into the lungs every 4 (four) hours as needed for wheezing or shortness of breath., Disp: , Rfl:  .  apixaban (ELIQUIS) 5 MG TABS tablet, Take 1 tablet (5 mg total) by mouth 2 (two) times daily., Disp: 60 tablet, Rfl: 2 .  ascorbic acid (VITAMIN C) 100 MG tablet, Take 1,000 mg by mouth daily., Disp: , Rfl:  .  buPROPion (WELLBUTRIN SR) 150 MG 12 hr tablet, Take 150 mg by mouth daily., Disp: , Rfl:  .  cyclobenzaprine (FLEXERIL) 5 MG tablet, Take 5 mg by mouth 3 (three) times daily as  needed for muscle spasms., Disp: , Rfl:  .  ergocalciferol (VITAMIN D2) 1.25 MG (50000 UT) capsule, Take 50,000 Units by mouth every Friday. , Disp: , Rfl:  .  erythromycin ophthalmic ointment, Place 1 application into both eyes at bedtime., Disp: , Rfl:  .  esomeprazole (NEXIUM) 40 MG capsule, Take by mouth., Disp: , Rfl:  .  ferrous sulfate 325 (65 FE) MG tablet, Take by mouth., Disp: , Rfl:  .  levothyroxine (SYNTHROID, LEVOTHROID) 88 MCG tablet, Take 75 mcg by mouth daily before breakfast. , Disp: , Rfl:  .  losartan (COZAAR) 50 MG tablet, Take 50 mg by mouth daily. prn, Disp: , Rfl:  .  metFORMIN (GLUCOPHAGE) 500 MG tablet, Take 500 mg by mouth daily. , Disp: , Rfl:  .  mometasone (NASONEX) 50 MCG/ACT nasal spray, Place 2 sprays into the nose daily., Disp: , Rfl:  .  predniSONE (DELTASONE) 10 MG tablet, Take 5 mg by mouth daily with breakfast. , Disp: , Rfl:  .  simvastatin (ZOCOR) 40 MG tablet, Take 40 mg by mouth daily., Disp: , Rfl:  .  apixaban (ELIQUIS) 5 MG TABS tablet, Take 2 tablets (10 mg total) by mouth 2 (two) times daily for 5 days., Disp: 20 tablet, Rfl: 0 .  lansoprazole (PREVACID) 30 MG capsule, Take 30 mg by mouth daily., Disp: , Rfl:  .  SYMBICORT 160-4.5 MCG/ACT inhaler, , Disp: , Rfl:   Physical exam:  Vitals:   08/22/19 1305  BP: (!) 150/88  Pulse: 80  Temp: 98.2 F (36.8 C)   TempSrc: Tympanic  Weight: 139 lb (63 kg)  Height: 5' (1.524 m)   Physical Exam Constitutional:      General: She is not in acute distress. HENT:     Head: Normocephalic and atraumatic.  Eyes:     Pupils: Pupils are equal, round, and reactive to light.  Neck:     Musculoskeletal: Normal range of motion.  Cardiovascular:     Rate and Rhythm: Normal rate and regular rhythm.     Heart sounds: Normal heart sounds.  Pulmonary:     Effort: Pulmonary effort is normal.     Breath sounds: Normal breath sounds.  Abdominal:     General: Bowel sounds are normal.     Palpations: Abdomen is soft.  Lymphadenopathy:     Comments: No palpable cervical, supraclavicular, axillary or inguinal adenopathy   Skin:    General: Skin is warm and dry.  Neurological:     Mental Status: She is alert and oriented to person, place, and time.      CMP Latest Ref Rng & Units 08/02/2019  Glucose 70 - 99 mg/dL 89  BUN 8 - 23 mg/dL 13  Creatinine 0.44 - 1.00 mg/dL 0.96  Sodium 135 - 145 mmol/L 140  Potassium 3.5 - 5.1 mmol/L 3.7  Chloride 98 - 111 mmol/L 106  CO2 22 - 32 mmol/L 26  Calcium 8.9 - 10.3 mg/dL 9.0  Total Protein 6.5 - 8.1 g/dL 6.8  Total Bilirubin 0.3 - 1.2 mg/dL 0.5  Alkaline Phos 38 - 126 U/L 57  AST 15 - 41 U/L 14(L)  ALT 0 - 44 U/L 8   CBC Latest Ref Rng & Units 08/22/2019  WBC 4.0 - 10.5 K/uL 8.3  Hemoglobin 12.0 - 15.0 g/dL 11.8(L)  Hematocrit 36.0 - 46.0 % 37.4  Platelets 150 - 400 K/uL 55(L)      Assessment and plan- Patient is a 63 y.o. female with  following issues:  1.  Right lower extremity DVT: I did review her prior INRs that were checked before she had a repeat DVT in June 2020 and also those INRs were within therapeutic range.  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  If she does have APS  ideally Coumadin is better than Eliquis but since she failed Coumadin-Lovenox or Arixtra shots lifelong would be a better alternative.  Patient is skeptical about taking shots lifelong and would like to continue with Eliquis for now.  I will also add PNH and Jak 2 mutation testing today.  Given that his had about 10 pound weight loss over the last 1 year and extensive DVT-I will check a CT chest abdomen pelvis to rule out malignancy.  Ideally she does not need CT scans but needs to be up-to-date with her mammogram and colonoscopy.  Currently she is on a blood thinner and has ongoing thrombocytopenia and with a recent DVT she may not be able to go for a colonoscopy soon I will discuss this with GI further.  For now she will continue to be on Eliquis. Patient did have an IVC filter as she had a thrombolyzes in June 2020 but the filter has not been taken out.  2.  Thrombocytopenia: Her platelet counts are slowly drifted down from 100-55.  ITP can be seen in APS versus worsening thrombocytopenia.  In order to keep her platelet count safely more than 50 while she is on Eliquis I will give her a trial of Decadron 40 mg for 4 days and see how her platelet counts respond.  If her platelet counts continue to remain low despite the Decadron I will consider doing a bone marrow biopsy before attempting Rituxan.  3.  Iron deficiency anemia: Improved after taking oral iron.  Repeat levels in 3 months.  I have also encouraged the patient to think about a second opinion at [facility] given her anticoagulation failure and concomitant thrombocytopenia.  Patient will follow-up with me in 2 weeks as scheduled   Total face to face encounter time for this patient visit was 30 min. >50% of the time was  spent in counseling and coordination of care.     Visit Diagnosis 1. Anticoagulant long-term use   2. Deep vein thrombosis (DVT) of right lower extremity, unspecified chronicity, unspecified vein (HCC)   3. Thrombocytopenia ([place])       Dr. Randa Evens, MD, MPH [facility] [phone] 08/22/2019 2:08 PM

## 2019-08-22 NOTE — Telephone Encounter (Signed)
Called pt and got her voicemail and left message that md wants pt to take dexamethasone 40 mg daily with food for 4 days. Please take it on full stomach and as soon as possible early in the day. Went over that it can cause red cheeks, red face, get hot spells, keep you from sleeping at night. Please call me back to go over this med in detail. She had prednisone on med list and do not take that with dexamethasone

## 2019-08-23 ENCOUNTER — Other Ambulatory Visit: Payer: Self-pay | Admitting: Family Medicine

## 2019-08-23 DIAGNOSIS — Z1231 Encounter for screening mammogram for malignant neoplasm of breast: Secondary | ICD-10-CM

## 2019-08-24 ENCOUNTER — Other Ambulatory Visit: Payer: Self-pay | Admitting: *Deleted

## 2019-08-24 DIAGNOSIS — Z87891 Personal history of nicotine dependence: Secondary | ICD-10-CM

## 2019-08-24 DIAGNOSIS — D6861 Antiphospholipid syndrome: Secondary | ICD-10-CM

## 2019-08-24 DIAGNOSIS — R634 Abnormal weight loss: Secondary | ICD-10-CM

## 2019-08-24 DIAGNOSIS — I82401 Acute embolism and thrombosis of unspecified deep veins of right lower extremity: Secondary | ICD-10-CM

## 2019-08-26 LAB — JAK2 GENOTYPR

## 2019-08-28 ENCOUNTER — Other Ambulatory Visit: Payer: Self-pay | Admitting: Oncology

## 2019-08-28 DIAGNOSIS — R634 Abnormal weight loss: Secondary | ICD-10-CM

## 2019-08-28 DIAGNOSIS — J449 Chronic obstructive pulmonary disease, unspecified: Secondary | ICD-10-CM

## 2019-08-28 DIAGNOSIS — I82401 Acute embolism and thrombosis of unspecified deep veins of right lower extremity: Secondary | ICD-10-CM

## 2019-08-29 ENCOUNTER — Telehealth: Payer: Self-pay

## 2019-08-29 NOTE — Telephone Encounter (Signed)
Patient was contacted to let her know that her CT Scans were scheduled for 09/05/2019 at 7:45 AM at the [facility] location. Patient was told to be fasting 4 hours prior. Patient was also informed to pick up her prep kit. Patient understood and had no further questions.

## 2019-08-30 LAB — PNH PROFILE (-HIGH SENSITIVITY)

## 2019-08-31 ENCOUNTER — Telehealth: Payer: Self-pay | Admitting: Oncology

## 2019-08-31 NOTE — Telephone Encounter (Signed)
Received VM that pt need more information on Contrast pick-up for 10/26 CT scan. Returned call to pt with details for Contrast pick and to contact office with any further questions. BF

## 2019-09-01 ENCOUNTER — Other Ambulatory Visit: Payer: Medicare Other

## 2019-09-01 ENCOUNTER — Ambulatory Visit: Payer: Medicare Other | Admitting: Oncology

## 2019-09-04 ENCOUNTER — Telehealth: Payer: Self-pay | Admitting: *Deleted

## 2019-09-04 NOTE — Telephone Encounter (Signed)
Pt had called and had questions about her ct scan for Alison Lopez. She wanted to know if she should take her eliquis. I told her she can. She went over inhaler, b/p pill and metformin. I did tell her to stop the metformin the day of the ct scan. I ave her instructions on how to get there. She knows where [facility] is. She feels like she will be ok getting there. I told her the appt for scan is 30 min. Then she will see [place] in afternoon.

## 2019-09-05 ENCOUNTER — Inpatient Hospital Stay (HOSPITAL_BASED_OUTPATIENT_CLINIC_OR_DEPARTMENT_OTHER): Payer: Medicare Other | Admitting: Oncology

## 2019-09-05 ENCOUNTER — Inpatient Hospital Stay: Payer: Medicare Other

## 2019-09-05 ENCOUNTER — Ambulatory Visit
Admission: RE | Admit: 2019-09-05 | Discharge: 2019-09-05 | Disposition: A | Discharge status: Home / Self Care | Payer: Medicare Other | Source: Ambulatory Visit | Attending: Oncology | Admitting: Oncology

## 2019-09-05 ENCOUNTER — Other Ambulatory Visit: Payer: Self-pay

## 2019-09-05 VITALS — BP 138/91 | HR 79 | Temp 97.8°F | Resp 18 | Wt 135.0 lb

## 2019-09-05 DIAGNOSIS — D696 Thrombocytopenia, unspecified: Secondary | ICD-10-CM

## 2019-09-05 DIAGNOSIS — R634 Abnormal weight loss: Secondary | ICD-10-CM | POA: Diagnosis present

## 2019-09-05 DIAGNOSIS — I82401 Acute embolism and thrombosis of unspecified deep veins of right lower extremity: Secondary | ICD-10-CM | POA: Diagnosis present

## 2019-09-05 DIAGNOSIS — D693 Immune thrombocytopenic purpura: Secondary | ICD-10-CM | POA: Diagnosis not present

## 2019-09-05 DIAGNOSIS — J449 Chronic obstructive pulmonary disease, unspecified: Secondary | ICD-10-CM | POA: Insufficient documentation

## 2019-09-05 DIAGNOSIS — Z7901 Long term (current) use of anticoagulants: Secondary | ICD-10-CM

## 2019-09-05 HISTORY — DX: Systemic involvement of connective tissue, unspecified: M35.9

## 2019-09-05 LAB — CBC WITH DIFFERENTIAL/PLATELET
Abs Immature Granulocytes: 0.06 10*3/uL (ref 0.00–0.07)
Basophils Absolute: 0.1 10*3/uL (ref 0.0–0.1)
Basophils Relative: 1 %
Eosinophils Absolute: 0.2 10*3/uL (ref 0.0–0.5)
Eosinophils Relative: 2 %
HCT: 34.3 % — ABNORMAL LOW (ref 36.0–46.0)
Hemoglobin: 10.9 g/dL — ABNORMAL LOW (ref 12.0–15.0)
Immature Granulocytes: 1 %
Lymphocytes Relative: 13 %
Lymphs Abs: 1.4 10*3/uL (ref 0.7–4.0)
MCH: 27.9 pg (ref 26.0–34.0)
MCHC: 31.8 g/dL (ref 30.0–36.0)
MCV: 87.9 fL (ref 80.0–100.0)
Monocytes Absolute: 0.8 10*3/uL (ref 0.1–1.0)
Monocytes Relative: 7 %
Neutro Abs: 8.9 10*3/uL — ABNORMAL HIGH (ref 1.7–7.7)
Neutrophils Relative %: 76 %
Platelets: 67 10*3/uL — ABNORMAL LOW (ref 150–400)
RBC: 3.9 MIL/uL (ref 3.87–5.11)
RDW: 15.5 % (ref 11.5–15.5)
WBC: 11.4 10*3/uL — ABNORMAL HIGH (ref 4.0–10.5)
nRBC: 0 % (ref 0.0–0.2)

## 2019-09-05 MED ORDER — IOHEXOL 300 MG/ML  SOLN: 100.0000 mL | Freq: Once | INTRAMUSCULAR | Status: DC | PRN

## 2019-09-05 MED ORDER — IOHEXOL 300 MG/ML  SOLN
85.0000 mL | Freq: Once | INTRAMUSCULAR | Status: AC | PRN
  Administered 2019-09-05: 85 mL via INTRAVENOUS

## 2019-09-06 ENCOUNTER — Encounter: Payer: Self-pay | Admitting: Oncology

## 2019-09-06 NOTE — Progress Notes (Signed)
Hematology/Oncology Consult note [facility]  Telephone:([phone] [phone]  Patient Care Team: Sharyne Peach, MD as PCP - General (Family Medicine)   Name of the patient: Alison Lopez  [id number]  1956-02-21   Date of visit: 09/06/19  Diagnosis- 1. Right lower extremity DVT 2. Iron deficiency anemia 3. ITP  Chief complaint/ Reason for visit-routine follow-up of thrombocytopenia  Heme/Onc history: Patient is a 63 yr old female with prior h/o LLE PROXIMAL DVT back in 1993 after an episode of pneumonia. Patient was started on coumadin back then and stayed on it. On 04/20/19 patient noticed pain and tightness in her RLE. She came to the ER and USG showed extensive right calf, femoral and popliteal DVT. INR was therapeutic at 2.2 on this day. Patient was switched to eliquis. Also seen by vascular surgery and underwent thrombectomy/ PTA and IVC filter placement. Patient reports intermittent headaches and nausea since she started taking eliquis. RLE swelling is getting better. She denies any DVT/PE in the 27 years she was on coumadin no pregnancy losses. No family h/o thrombosis. She has 2 adult children with no h/o DVT  At the time of DVT patient also noted to have iron deficiency anemia and thrombocytopenia. Platelet counts have been normal in 2018. Recent H/H was 7.7/25.3 and platelet count was 72. She did receive 1 dose of IV iron and currently she is taking oral iron. She denies any blood in stools. Stools have beendark since she started taking po iron   Interval history-feels fatigued but denies other complaints.  She did complete 4 days of Decadron  ECOG PS- 1 Pain scale- 0  Review of systems- Review of Systems  Constitutional: Positive for malaise/fatigue. Negative for chills, fever and weight loss.  HENT: Negative for congestion, ear discharge and nosebleeds.   Eyes: Negative for blurred vision.  Respiratory: Negative for cough,  hemoptysis, sputum production, shortness of breath and wheezing.   Cardiovascular: Negative for chest pain, palpitations, orthopnea and claudication.  Gastrointestinal: Negative for abdominal pain, blood in stool, constipation, diarrhea, heartburn, melena, nausea and vomiting.  Genitourinary: Negative for dysuria, flank pain, frequency, hematuria and urgency.  Musculoskeletal: Negative for back pain, joint pain and myalgias.  Skin: Negative for rash.  Neurological: Negative for dizziness, tingling, focal weakness, seizures, weakness and headaches.  Endo/Heme/Allergies: Does not bruise/bleed easily.  Psychiatric/Behavioral: Negative for depression and suicidal ideas. The patient does not have insomnia.       Allergies  Allergen Reactions   Morphine And Related Anaphylaxis   Aspirin Other (See Comments)    Patient on blood thinners and was told not to take   Benadryl [Diphenhydramine Hcl] Hives   Codeine Other (See Comments)    AMS     Past Medical History:  Diagnosis Date   Arthritis    Collagen vascular disease (HCC)    COPD (chronic obstructive pulmonary disease) (HCC)    Depression    Diabetes mellitus without complication (HCC)    DVT (deep venous thrombosis) (HCC)    Fibromyalgia    Hypertension    Lupus ([place])    Osteoporosis      Past Surgical History:  Procedure Laterality Date   ABDOMINAL HYSTERECTOMY     IVC FILTER REMOVAL N/A 07/21/2019   Procedure: IVC FILTER REMOVAL;  Surgeon: Algernon Huxley, MD;  Location: [facility];  Service: Cardiovascular;  Laterality: N/A;   LOWER EXTREMITY INTERVENTION Right 04/22/2019   Procedure: IVC Filter Insertion with Right  Lower Extremity Venous Lysis;  Surgeon: Algernon Huxley, MD;  Location: [facility];  Service: Cardiovascular;  Laterality: Right;   OOPHORECTOMY      Social History   Socioeconomic History   Marital status: Divorced    Spouse name: Not on file   Number of children: Not on  file   Years of education: Not on file   Highest education level: Not on file  Occupational History   Not on file  Social Needs   Financial resource strain: Not on file   Food insecurity    Worry: Not on file    Inability: Not on file   Transportation needs    Medical: Not on file    Non-medical: Not on file  Tobacco Use   Smoking status: Former Smoker    Packs/day: 1.00    Years: 17.00    Pack years: 17.00    Types: Cigarettes    Quit date: 2002    Years since quitting: 18.8   Smokeless tobacco: Never Used  Substance and Sexual Activity   Alcohol use: Not Currently   Drug use: Never   Sexual activity: Not on file  Lifestyle   Physical activity    Days per week: Not on file    Minutes per session: Not on file   Stress: Not on file  Relationships   Social connections    Talks on phone: Not on file    Gets together: Not on file    Attends religious service: Not on file    Active member of club or organization: Not on file    Attends meetings of clubs or organizations: Not on file    Relationship status: Not on file   Intimate partner violence    Fear of current or ex partner: Not on file    Emotionally abused: Not on file    Physically abused: Not on file    Forced sexual activity: Not on file  Other Topics Concern   Not on file  Social History Narrative   Not on file    Family History  Problem Relation Age of Onset   Stomach cancer Mother    Heart attack Father    Breast cancer Neg Hx      Current Outpatient Medications:    acetaminophen (TYLENOL) 650 MG CR tablet, Take 650 mg by mouth every 8 (eight) hours as needed for pain., Disp: , Rfl:    albuterol (PROVENTIL) (2.5 MG/3ML) 0.083% nebulizer solution, Take 2.5 mg by nebulization every 4 (four) hours as needed for wheezing or shortness of breath. , Disp: , Rfl:    albuterol (VENTOLIN HFA) 108 (90 Base) MCG/ACT inhaler, Inhale 2 puffs into the lungs every 4 (four) hours as needed for  wheezing or shortness of breath., Disp: , Rfl:    apixaban (ELIQUIS) 5 MG TABS tablet, Take 1 tablet (5 mg total) by mouth 2 (two) times daily., Disp: 60 tablet, Rfl: 2   buPROPion (WELLBUTRIN SR) 150 MG 12 hr tablet, Take 150 mg by mouth daily., Disp: , Rfl:    cyclobenzaprine (FLEXERIL) 5 MG tablet, Take 5 mg by mouth 3 (three) times daily as needed for muscle spasms., Disp: , Rfl:    ergocalciferol (VITAMIN D2) 1.25 MG (50000 UT) capsule, Take 50,000 Units by mouth every Friday. , Disp: , Rfl:    erythromycin ophthalmic ointment, Place 1 application into both eyes at bedtime., Disp: , Rfl:    esomeprazole (NEXIUM) 40 MG capsule, Take by  mouth., Disp: , Rfl:    ferrous sulfate 325 (65 FE) MG tablet, Take by mouth., Disp: , Rfl:    lansoprazole (PREVACID) 30 MG capsule, Take 30 mg by mouth daily., Disp: , Rfl:    levothyroxine (SYNTHROID, LEVOTHROID) 88 MCG tablet, Take 75 mcg by mouth daily before breakfast. , Disp: , Rfl:    losartan (COZAAR) 50 MG tablet, Take 50 mg by mouth daily. prn, Disp: , Rfl:    metFORMIN (GLUCOPHAGE) 500 MG tablet, Take 500 mg by mouth daily. , Disp: , Rfl:    mometasone (NASONEX) 50 MCG/ACT nasal spray, Place 2 sprays into the nose daily., Disp: , Rfl:    predniSONE (DELTASONE) 10 MG tablet, Take 5 mg by mouth daily with breakfast. , Disp: , Rfl:    simvastatin (ZOCOR) 40 MG tablet, Take 40 mg by mouth daily., Disp: , Rfl:    SYMBICORT 160-4.5 MCG/ACT inhaler, , Disp: , Rfl:    ascorbic acid (VITAMIN C) 100 MG tablet, Take 1,000 mg by mouth daily., Disp: , Rfl:   Physical exam:  Vitals:   09/05/19 1458  BP: (!) 138/91  Pulse: 79  Resp: 18  Temp: 97.8 F (36.6 C)  TempSrc: Tympanic  Weight: 135 lb (61.2 kg)   Physical Exam Constitutional:      General: She is not in acute distress. HENT:     Head: Normocephalic and atraumatic.  Eyes:     Pupils: Pupils are equal, round, and reactive to light.  Neck:     Musculoskeletal: Normal range of  motion.  Cardiovascular:     Rate and Rhythm: Normal rate and regular rhythm.     Heart sounds: Normal heart sounds.  Pulmonary:     Effort: Pulmonary effort is normal.     Breath sounds: Normal breath sounds.  Abdominal:     General: Bowel sounds are normal.     Palpations: Abdomen is soft.  Skin:    General: Skin is warm and dry.  Neurological:     Mental Status: She is alert and oriented to person, place, and time.      CMP Latest Ref Rng & Units 08/02/2019  Glucose 70 - 99 mg/dL 89  BUN 8 - 23 mg/dL 13  Creatinine 0.44 - 1.00 mg/dL 0.96  Sodium 135 - 145 mmol/L 140  Potassium 3.5 - 5.1 mmol/L 3.7  Chloride 98 - 111 mmol/L 106  CO2 22 - 32 mmol/L 26  Calcium 8.9 - 10.3 mg/dL 9.0  Total Protein 6.5 - 8.1 g/dL 6.8  Total Bilirubin 0.3 - 1.2 mg/dL 0.5  Alkaline Phos 38 - 126 U/L 57  AST 15 - 41 U/L 14(L)  ALT 0 - 44 U/L 8   CBC Latest Ref Rng & Units 09/05/2019  WBC 4.0 - 10.5 K/uL 11.4(H)  Hemoglobin 12.0 - 15.0 g/dL 10.9(L)  Hematocrit 36.0 - 46.0 % 34.3(L)  Platelets 150 - 400 K/uL 67(L)    No images are attached to the encounter.  Ct Chest W Contrast  Result Date: 09/05/2019 CLINICAL DATA:  Unintentional weight loss, DVT with thrombocytopenia. EXAM: CT CHEST, ABDOMEN, AND PELVIS WITH CONTRAST TECHNIQUE: Multidetector CT imaging of the chest, abdomen and pelvis was performed following the standard protocol during bolus administration of intravenous contrast. CONTRAST:  51mL OMNIPAQUE IOHEXOL 300 MG/ML  SOLN COMPARISON:  None. FINDINGS: CT CHEST FINDINGS Cardiovascular: Atherosclerotic calcification of the aorta. Heart is mildly enlarged. No pericardial effusion. Mediastinum/Nodes: Mediastinal and hilar lymph nodes are not enlarged by CT  size criteria. No axillary adenopathy. Esophagus is unremarkable. Lungs/Pleura: Mild centrilobular emphysema. Patchy bilateral peribronchovascular ground-glass, basilar and somewhat peripheral predominant. A few scattered pulmonary  nodules measure 3 mm or less in size. Scarring in the lingula and left lower lobe. No pleural fluid. Airway is unremarkable. Musculoskeletal: Degenerative changes in the spine. Old bilateral rib fractures. No worrisome lytic or sclerotic lesions. CT ABDOMEN PELVIS FINDINGS Hepatobiliary: Liver is unremarkable. Large stones in the gallbladder measure up to 2.1 cm. No biliary ductal dilatation. Pancreas: Negative. Spleen: Negative. Adrenals/Urinary Tract: Adrenal glands are unremarkable. Renal parenchymal thinning bilaterally. Subcentimeter low-attenuation lesion in the interpolar right kidney is too small to characterize but statistically, a cyst is likely. Kidneys are otherwise unremarkable. Ureters are decompressed. Bladder is grossly unremarkable. Stomach/Bowel: Stomach, small bowel, appendix and colon are unremarkable. Vascular/Lymphatic: Atherosclerotic calcification of the aorta without aneurysm. No pathologically enlarged lymph nodes. Reproductive: Hysterectomy.  No adnexal mass. Other: No free fluid.  Mesenteries and peritoneum are unremarkable. Musculoskeletal: Degenerative changes in the spine. Prominent Schmorl's node and probable mild compression deformity of the superior endplate of L2, chronic. IMPRESSION: 1. No findings to explain the patient's given clinical history. 2. Patchy peribronchovascular ground-glass with somewhat of a peripheral and basilar distribution. There is a spectrum of findings in the lungs which can be seen with acute atypical infection (as well as other non-infectious etiologies). In particular, viral pneumonia (including COVID-19) should be considered in the appropriate clinical setting. 3. Few pulmonary nodules measure 3 mm or less in size. No follow-up needed if patient is low-risk (and has no known or suspected primary neoplasm). Non-contrast chest CT can be considered in 12 months if patient is high-risk. This recommendation follows the consensus statement: Guidelines for  Management of Incidental Pulmonary Nodules Detected on CT Images: From the Fleischner Society 2017; Radiology 2017; [id number]. 4. Cholelithiasis. 5.  Aortic atherosclerosis (ICD10-170.0). Electronically Signed   By: Lorin Picket M.D.   On: 09/05/2019 08:48   Ct Abdomen Pelvis W Contrast  Result Date: 09/05/2019 CLINICAL DATA:  Unintentional weight loss, DVT with thrombocytopenia. EXAM: CT CHEST, ABDOMEN, AND PELVIS WITH CONTRAST TECHNIQUE: Multidetector CT imaging of the chest, abdomen and pelvis was performed following the standard protocol during bolus administration of intravenous contrast. CONTRAST:  45mL OMNIPAQUE IOHEXOL 300 MG/ML  SOLN COMPARISON:  None. FINDINGS: CT CHEST FINDINGS Cardiovascular: Atherosclerotic calcification of the aorta. Heart is mildly enlarged. No pericardial effusion. Mediastinum/Nodes: Mediastinal and hilar lymph nodes are not enlarged by CT size criteria. No axillary adenopathy. Esophagus is unremarkable. Lungs/Pleura: Mild centrilobular emphysema. Patchy bilateral peribronchovascular ground-glass, basilar and somewhat peripheral predominant. A few scattered pulmonary nodules measure 3 mm or less in size. Scarring in the lingula and left lower lobe. No pleural fluid. Airway is unremarkable. Musculoskeletal: Degenerative changes in the spine. Old bilateral rib fractures. No worrisome lytic or sclerotic lesions. CT ABDOMEN PELVIS FINDINGS Hepatobiliary: Liver is unremarkable. Large stones in the gallbladder measure up to 2.1 cm. No biliary ductal dilatation. Pancreas: Negative. Spleen: Negative. Adrenals/Urinary Tract: Adrenal glands are unremarkable. Renal parenchymal thinning bilaterally. Subcentimeter low-attenuation lesion in the interpolar right kidney is too small to characterize but statistically, a cyst is likely. Kidneys are otherwise unremarkable. Ureters are decompressed. Bladder is grossly unremarkable. Stomach/Bowel: Stomach, small bowel, appendix and colon are  unremarkable. Vascular/Lymphatic: Atherosclerotic calcification of the aorta without aneurysm. No pathologically enlarged lymph nodes. Reproductive: Hysterectomy.  No adnexal mass. Other: No free fluid.  Mesenteries and peritoneum are unremarkable. Musculoskeletal: Degenerative changes in the spine. Prominent  Schmorl's node and probable mild compression deformity of the superior endplate of L2, chronic. IMPRESSION: 1. No findings to explain the patient's given clinical history. 2. Patchy peribronchovascular ground-glass with somewhat of a peripheral and basilar distribution. There is a spectrum of findings in the lungs which can be seen with acute atypical infection (as well as other non-infectious etiologies). In particular, viral pneumonia (including COVID-19) should be considered in the appropriate clinical setting. 3. Few pulmonary nodules measure 3 mm or less in size. No follow-up needed if patient is low-risk (and has no known or suspected primary neoplasm). Non-contrast chest CT can be considered in 12 months if patient is high-risk. This recommendation follows the consensus statement: Guidelines for Management of Incidental Pulmonary Nodules Detected on CT Images: From the Fleischner Society 2017; Radiology 2017; [id number]. 4. Cholelithiasis. 5.  Aortic atherosclerosis (ICD10-170.0). Electronically Signed   By: Lorin Picket M.D.   On: 09/05/2019 08:48     Assessment and plan- Patient is a 63 y.o. female with following issues:  1.  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  Her first set of labs for antiphospholipid antibody syndrome were positive and I will be repeating her second set of labs in 1 month.  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  Ideally she would benefit from Coumadin over newer anticoagulants in APS.  However she has had 2 Coumadin failure and I did review her PT/INR when she was on Coumadin and they were indeed therapeutic when she presented  with her second episode.  Continue Eliquis for now and I will discuss long-term short such as Lovenox or Arixtra when the second set of APS labs are back.  We also did further work-up including JAK2 mutation testing and PNH testing to see if there are any other causes for second episode of DVT and they were negative  2.  Thrombocytopenia: This has been an issue since her DVT in June 2020.  It is possible that she has ITP which can be seen in the setting of APS.  We did give her 4 doses of Decadron last week and her platelet counts are mildly improved at 67 today.  I will continue to monitor her platelet counts closely and she remains on anticoagulation.  If her platelet counts again dropped to less than 50 I will consider giving her Rituxan for possible ITP second line at that time.  Repeat CBC with differential in 1 month and 2 months and I will see him back in 2 months  3.  Patient was having symptoms of unintentional weight loss and given her second episode of DVT I also obtained a CT chest abdomen pelvis which showed no evidence of malignancy.  She was found to have peribronchovascular groundglass appearance in her lungs.  Clinically she does not complain of any fever shortness of breath or cough.  Continue to monitor.   4.  Iron deficiency anemia: Colonoscopy currently on hold due to her thrombocytopenia and recent DVT.  Iron studies done 2 weeks ago were normal.  Continue to monitor    Visit Diagnosis 1. Acute ITP ([place])   2. Deep vein thrombosis (DVT) of right lower extremity, unspecified chronicity, unspecified vein (HCC)      Dr. Randa Evens, MD, MPH [facility] [id number] 09/06/2019 9:35 AM

## 2019-10-05 ENCOUNTER — Inpatient Hospital Stay: Payer: Medicare Other | Attending: Oncology

## 2019-10-05 ENCOUNTER — Other Ambulatory Visit: Payer: Self-pay

## 2019-10-05 DIAGNOSIS — D693 Immune thrombocytopenic purpura: Secondary | ICD-10-CM | POA: Insufficient documentation

## 2019-10-05 DIAGNOSIS — Z7901 Long term (current) use of anticoagulants: Secondary | ICD-10-CM | POA: Diagnosis not present

## 2019-10-05 DIAGNOSIS — Z86718 Personal history of other venous thrombosis and embolism: Secondary | ICD-10-CM | POA: Insufficient documentation

## 2019-10-05 LAB — CBC WITH DIFFERENTIAL/PLATELET
Abs Immature Granulocytes: 0.04 10*3/uL (ref 0.00–0.07)
Basophils Absolute: 0.1 10*3/uL (ref 0.0–0.1)
Basophils Relative: 1 %
Eosinophils Absolute: 0.1 10*3/uL (ref 0.0–0.5)
Eosinophils Relative: 1 %
HCT: 37 % (ref 36.0–46.0)
Hemoglobin: 11.6 g/dL — ABNORMAL LOW (ref 12.0–15.0)
Immature Granulocytes: 0 %
Lymphocytes Relative: 8 %
Lymphs Abs: 0.7 10*3/uL (ref 0.7–4.0)
MCH: 28.7 pg (ref 26.0–34.0)
MCHC: 31.4 g/dL (ref 30.0–36.0)
MCV: 91.6 fL (ref 80.0–100.0)
Monocytes Absolute: 0.5 10*3/uL (ref 0.1–1.0)
Monocytes Relative: 6 %
Neutro Abs: 8.1 10*3/uL — ABNORMAL HIGH (ref 1.7–7.7)
Neutrophils Relative %: 84 %
Platelets: 61 10*3/uL — ABNORMAL LOW (ref 150–400)
RBC: 4.04 MIL/uL (ref 3.87–5.11)
RDW: 15 % (ref 11.5–15.5)
WBC: 9.6 10*3/uL (ref 4.0–10.5)
nRBC: 0 % (ref 0.0–0.2)

## 2019-10-13 ENCOUNTER — Other Ambulatory Visit: Payer: Self-pay

## 2019-10-13 DIAGNOSIS — Z20822 Contact with and (suspected) exposure to covid-19: Secondary | ICD-10-CM

## 2019-10-14 ENCOUNTER — Telehealth: Payer: Self-pay

## 2019-10-14 NOTE — Telephone Encounter (Signed)
Called [place] office and ask if patient had been seen. I was told by [facility] secretary that patient was scheduled to come in on 09/29/2019 and patient called to cancelled and stated that she would call them if she needed them. In other words, patient declined services at this time. Dr. Janese Banks will be notified.

## 2019-10-16 LAB — NOVEL CORONAVIRUS, NAA: SARS-CoV-2, NAA: DETECTED — AB

## 2019-10-17 ENCOUNTER — Telehealth: Payer: Self-pay | Admitting: Nurse Practitioner

## 2019-10-17 NOTE — Telephone Encounter (Signed)
Called to Discuss with patient about Covid symptoms and the use of bamlanivimab, a monoclonal antibody infusion for those with mild to moderate Covid symptoms and at a high risk of hospitalization.     Pt is qualified for this infusion at the [facility] infusion center due to co-morbid conditions and/or a member of an at-risk group.    Patient is being managed for the following:  Patient Active Problem List   Diagnosis Date Noted  . COPD (chronic obstructive pulmonary disease) ([place]) 06/28/2019  . Depression 06/28/2019  . Fibromyalgia 06/28/2019  . GERD (gastroesophageal reflux disease) 06/28/2019  . Hyperlipidemia 06/28/2019  . 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 Rheumatoid arthritis ([place]) 06/28/2019  . Diabetes ([place]) 05/17/2019  . Essential hypertension 05/17/2019  . DVT (deep venous thrombosis) ([place]) 04/21/2019  . Right leg pain 03/21/2019  . Compression fracture of L2 ([place]) 01/28/2017  . Osteoporosis of multiple sites 01/28/2017  . Pain in joint, pelvic region and thigh 01/28/2017  . Abnormal intentional weight loss 12/16/2016  . DDD (degenerative disc disease), lumbar 12/16/2016  . Generalized osteoarthritis of hand 12/16/2016  . Anticoagulant long-term use 01/31/2015  . Episode of dizziness 01/25/2015  . History of DVT of lower extremity 01/25/2015  . Type 2 diabetes mellitus without complication ([place]) [id number]     Patient states that symptom onset was greater than 10 days ago and due to this does not meet criteria for infusion.

## 2019-11-08 ENCOUNTER — Inpatient Hospital Stay: Payer: Medicare Other | Admitting: Oncology

## 2019-11-08 ENCOUNTER — Inpatient Hospital Stay: Payer: Medicare Other

## 2019-11-18 ENCOUNTER — Other Ambulatory Visit: Payer: Self-pay

## 2019-11-21 ENCOUNTER — Other Ambulatory Visit: Payer: Self-pay

## 2019-11-21 ENCOUNTER — Inpatient Hospital Stay: Payer: Medicare Other | Attending: Oncology

## 2019-11-21 DIAGNOSIS — D509 Iron deficiency anemia, unspecified: Secondary | ICD-10-CM | POA: Insufficient documentation

## 2019-11-21 DIAGNOSIS — I10 Essential (primary) hypertension: Secondary | ICD-10-CM | POA: Insufficient documentation

## 2019-11-21 DIAGNOSIS — M329 Systemic lupus erythematosus, unspecified: Secondary | ICD-10-CM | POA: Insufficient documentation

## 2019-11-21 DIAGNOSIS — Z87891 Personal history of nicotine dependence: Secondary | ICD-10-CM | POA: Insufficient documentation

## 2019-11-21 DIAGNOSIS — Z86718 Personal history of other venous thrombosis and embolism: Secondary | ICD-10-CM | POA: Insufficient documentation

## 2019-11-21 DIAGNOSIS — D696 Thrombocytopenia, unspecified: Secondary | ICD-10-CM | POA: Diagnosis present

## 2019-11-21 DIAGNOSIS — Z7952 Long term (current) use of systemic steroids: Secondary | ICD-10-CM | POA: Diagnosis not present

## 2019-11-21 DIAGNOSIS — Z7984 Long term (current) use of oral hypoglycemic drugs: Secondary | ICD-10-CM | POA: Insufficient documentation

## 2019-11-21 DIAGNOSIS — E119 Type 2 diabetes mellitus without complications: Secondary | ICD-10-CM | POA: Diagnosis not present

## 2019-11-21 DIAGNOSIS — M1990 Unspecified osteoarthritis, unspecified site: Secondary | ICD-10-CM | POA: Insufficient documentation

## 2019-11-21 DIAGNOSIS — M797 Fibromyalgia: Secondary | ICD-10-CM | POA: Diagnosis not present

## 2019-11-21 DIAGNOSIS — M810 Age-related osteoporosis without current pathological fracture: Secondary | ICD-10-CM | POA: Diagnosis not present

## 2019-11-21 DIAGNOSIS — F329 Major depressive disorder, single episode, unspecified: Secondary | ICD-10-CM | POA: Insufficient documentation

## 2019-11-21 DIAGNOSIS — R5381 Other malaise: Secondary | ICD-10-CM | POA: Diagnosis not present

## 2019-11-21 DIAGNOSIS — J449 Chronic obstructive pulmonary disease, unspecified: Secondary | ICD-10-CM | POA: Insufficient documentation

## 2019-11-21 DIAGNOSIS — R5383 Other fatigue: Secondary | ICD-10-CM | POA: Diagnosis not present

## 2019-11-21 DIAGNOSIS — Z79899 Other long term (current) drug therapy: Secondary | ICD-10-CM | POA: Insufficient documentation

## 2019-11-21 DIAGNOSIS — Z7951 Long term (current) use of inhaled steroids: Secondary | ICD-10-CM | POA: Insufficient documentation

## 2019-11-21 DIAGNOSIS — D693 Immune thrombocytopenic purpura: Secondary | ICD-10-CM

## 2019-11-21 DIAGNOSIS — R110 Nausea: Secondary | ICD-10-CM | POA: Insufficient documentation

## 2019-11-21 DIAGNOSIS — R519 Headache, unspecified: Secondary | ICD-10-CM | POA: Insufficient documentation

## 2019-11-21 DIAGNOSIS — Z7901 Long term (current) use of anticoagulants: Secondary | ICD-10-CM | POA: Diagnosis not present

## 2019-11-21 LAB — CBC WITH DIFFERENTIAL/PLATELET
Abs Immature Granulocytes: 0.04 10*3/uL (ref 0.00–0.07)
Basophils Absolute: 0.1 10*3/uL (ref 0.0–0.1)
Basophils Relative: 1 %
Eosinophils Absolute: 0.2 10*3/uL (ref 0.0–0.5)
Eosinophils Relative: 2 %
HCT: 40.4 % (ref 36.0–46.0)
Hemoglobin: 12.4 g/dL (ref 12.0–15.0)
Immature Granulocytes: 0 %
Lymphocytes Relative: 15 %
Lymphs Abs: 1.6 10*3/uL (ref 0.7–4.0)
MCH: 28.2 pg (ref 26.0–34.0)
MCHC: 30.7 g/dL (ref 30.0–36.0)
MCV: 92 fL (ref 80.0–100.0)
Monocytes Absolute: 0.6 10*3/uL (ref 0.1–1.0)
Monocytes Relative: 6 %
Neutro Abs: 8 10*3/uL — ABNORMAL HIGH (ref 1.7–7.7)
Neutrophils Relative %: 76 %
Platelets: 76 10*3/uL — ABNORMAL LOW (ref 150–400)
RBC: 4.39 MIL/uL (ref 3.87–5.11)
RDW: 14.3 % (ref 11.5–15.5)
WBC: 10.5 10*3/uL (ref 4.0–10.5)
nRBC: 0 % (ref 0.0–0.2)

## 2019-11-22 ENCOUNTER — Inpatient Hospital Stay (HOSPITAL_BASED_OUTPATIENT_CLINIC_OR_DEPARTMENT_OTHER): Payer: Medicare Other | Admitting: Oncology

## 2019-11-22 DIAGNOSIS — Z86718 Personal history of other venous thrombosis and embolism: Secondary | ICD-10-CM | POA: Diagnosis not present

## 2019-11-22 DIAGNOSIS — D509 Iron deficiency anemia, unspecified: Secondary | ICD-10-CM

## 2019-11-22 DIAGNOSIS — D696 Thrombocytopenia, unspecified: Secondary | ICD-10-CM

## 2019-11-24 ENCOUNTER — Encounter: Payer: Self-pay | Admitting: Oncology

## 2019-11-24 NOTE — Progress Notes (Signed)
I connected with Alison Lopez on 11/24/19 at  9:30 AM EST by video enabled telemedicine visit and verified that I am speaking with the correct person using two identifiers.   I discussed the limitations, risks, security and privacy concerns of performing an evaluation and management service by telemedicine and the availability of in-person appointments. I also discussed with the patient that there may be a patient responsible charge related to this service. The patient expressed understanding and agreed to proceed.  Other persons participating in the visit and their role in the encounter:  none  Patient's location:  home Provider's location:  work  Risk analyst Complaint:  Routine f/u of thrombocytopenia and h/o dvt on chronic anticoagulation  History of present illness: Patient is a 64 yr old female with prior h/o LLEPROXIMALDVT back in 1993 after an episode of pneumonia. Patient was started on coumadin back then and stayed on it. On 04/20/19 patient noticed pain and tightness in her RLE. She came to the ER and USG showed extensive right calf, femoral and popliteal DVT. INR was therapeutic at 2.2 on this day. Patient was switched to eliquis. Also seen by vascular surgery and underwent thrombectomy/ PTA and IVC filter placement. Patient reports intermittent headaches and nausea since she started taking eliquis. RLE swelling is getting better. She denies any DVT/PE in the 27 years she was on coumadin no pregnancy losses. No family h/o thrombosis. She has 2 adult children with no h/o DVT  At the time of DVT patient also noted to have iron deficiency anemia and thrombocytopenia. Platelet counts have been normal in 2018. Recent H/H was 7.7/25.3 and platelet count was 72. She did receive 1 dose of IV iron and currently she is taking oral iron. She denies any blood in stools. Stools have beendark since she started taking po iron   Interval history: recently recovered from covid infection. Feels fatigued.  Denies any bleeding/bruising   Review of Systems  Constitutional: Positive for malaise/fatigue. Negative for chills, fever and weight loss.  HENT: Negative for congestion, ear discharge and nosebleeds.   Eyes: Negative for blurred vision.  Respiratory: Negative for cough, hemoptysis, sputum production, shortness of breath and wheezing.   Cardiovascular: Negative for chest pain, palpitations, orthopnea and claudication.  Gastrointestinal: Negative for abdominal pain, blood in stool, constipation, diarrhea, heartburn, melena, nausea and vomiting.  Genitourinary: Negative for dysuria, flank pain, frequency, hematuria and urgency.  Musculoskeletal: Negative for back pain, joint pain and myalgias.  Skin: Negative for rash.  Neurological: Negative for dizziness, tingling, focal weakness, seizures, weakness and headaches.  Endo/Heme/Allergies: Does not bruise/bleed easily.  Psychiatric/Behavioral: Negative for depression and suicidal ideas. The patient does not have insomnia.     Allergies  Allergen Reactions  . Morphine And Related Anaphylaxis  . Aspirin Other (See Comments)    Patient on blood thinners and was told not to take  . Benadryl [Diphenhydramine Hcl] Hives  . Codeine Other (See Comments)    AMS    Past Medical History:  Diagnosis Date  . Arthritis   . Collagen vascular disease ([place])   . COPD (chronic obstructive pulmonary disease) ([place])   . Depression   . Diabetes mellitus without complication ([place])   . DVT (deep venous thrombosis) ([place])   . Fibromyalgia   . Hypertension   . Lupus ([place])   . Osteoporosis     Past Surgical History:  Procedure Laterality Date  . ABDOMINAL HYSTERECTOMY    . IVC FILTER REMOVAL N/A 07/21/2019   Procedure: IVC  FILTER REMOVAL;  Surgeon: Algernon Huxley, MD;  Location: [facility];  Service: Cardiovascular;  Laterality: N/A;  . LOWER EXTREMITY INTERVENTION Right 04/22/2019   Procedure: IVC Filter Insertion with Right Lower Extremity  Venous Lysis;  Surgeon: Algernon Huxley, MD;  Location: [facility];  Service: Cardiovascular;  Laterality: Right;  . OOPHORECTOMY      Social History   Socioeconomic History  . Marital status: Divorced    Spouse name: Not on file  . Number of children: Not on file  . Years of education: Not on file  . Highest education level: Not on file  Occupational History  . Not on file  Tobacco Use  . Smoking status: Former Smoker    Packs/day: 1.00    Years: 17.00    Pack years: 17.00    Types: Cigarettes    Quit date: 2002    Years since quitting: 19.0  . Smokeless tobacco: Never Used  Substance and Sexual Activity  . Alcohol use: Not Currently  . Drug use: Never  . Sexual activity: Not on file  Other Topics Concern  . Not on file  Social History Narrative  . Not on file   Social Determinants of Health   Financial Resource Strain:   . Difficulty of Paying Living Expenses: Not on file  Food Insecurity:   . Worried About Charity fundraiser in the Last Year: Not on file  . Ran Out of Food in the Last Year: Not on file  Transportation Needs:   . Lack of Transportation (Medical): Not on file  . Lack of Transportation (Non-Medical): Not on file  Physical Activity:   . Days of Exercise per Week: Not on file  . Minutes of Exercise per Session: Not on file  Stress:   . Feeling of Stress : Not on file  Social Connections:   . Frequency of Communication with Friends and Family: Not on file  . Frequency of Social Gatherings with Friends and Family: Not on file  . Attends Religious Services: Not on file  . Active Member of Clubs or Organizations: Not on file  . Attends Archivist Meetings: Not on file  . Marital Status: Not on file  Intimate Partner Violence:   . Fear of Current or Ex-Partner: Not on file  . Emotionally Abused: Not on file  . Physically Abused: Not on file  . Sexually Abused: Not on file    Family History  Problem Relation Age of Onset  .  Stomach cancer Mother   . Heart attack Father   . Breast cancer Neg Hx      Current Outpatient Medications:  .  acetaminophen (TYLENOL) 650 MG CR tablet, Take 650 mg by mouth every 8 (eight) hours as needed for pain., Disp: , Rfl:  .  albuterol (PROVENTIL) (2.5 MG/3ML) 0.083% nebulizer solution, Take 2.5 mg by nebulization every 4 (four) hours as needed for wheezing or shortness of breath. , Disp: , Rfl:  .  albuterol (VENTOLIN HFA) 108 (90 Base) MCG/ACT inhaler, Inhale 2 puffs into the lungs every 4 (four) hours as needed for wheezing or shortness of breath., Disp: , Rfl:  .  apixaban (ELIQUIS) 5 MG TABS tablet, Take 1 tablet (5 mg total) by mouth 2 (two) times daily., Disp: 60 tablet, Rfl: 2 .  ascorbic acid (VITAMIN C) 100 MG tablet, Take 1,000 mg by mouth daily., Disp: , Rfl:  .  buPROPion (WELLBUTRIN SR) 150 MG 12 hr  tablet, Take 150 mg by mouth daily., Disp: , Rfl:  .  cyclobenzaprine (FLEXERIL) 5 MG tablet, Take 5 mg by mouth 3 (three) times daily as needed for muscle spasms., Disp: , Rfl:  .  ergocalciferol (VITAMIN D2) 1.25 MG (50000 UT) capsule, Take 50,000 Units by mouth every Friday. , Disp: , Rfl:  .  erythromycin ophthalmic ointment, Place 1 application into both eyes at bedtime., Disp: , Rfl:  .  esomeprazole (NEXIUM) 40 MG capsule, Take by mouth., Disp: , Rfl:  .  ferrous sulfate 325 (65 FE) MG tablet, Take by mouth., Disp: , Rfl:  .  lansoprazole (PREVACID) 30 MG capsule, Take 30 mg by mouth daily., Disp: , Rfl:  .  levothyroxine (SYNTHROID, LEVOTHROID) 88 MCG tablet, Take 75 mcg by mouth daily before breakfast. , Disp: , Rfl:  .  losartan (COZAAR) 50 MG tablet, Take 50 mg by mouth daily. prn, Disp: , Rfl:  .  metFORMIN (GLUCOPHAGE) 500 MG tablet, Take 500 mg by mouth daily. , Disp: , Rfl:  .  mometasone (NASONEX) 50 MCG/ACT nasal spray, Place 2 sprays into the nose daily., Disp: , Rfl:  .  predniSONE (DELTASONE) 10 MG tablet, Take 5 mg by mouth daily with breakfast. , Disp: ,  Rfl:  .  simvastatin (ZOCOR) 40 MG tablet, Take 40 mg by mouth daily., Disp: , Rfl:  .  SYMBICORT 160-4.5 MCG/ACT inhaler, , Disp: , Rfl:   No results found.  No images are attached to the encounter.   CMP Latest Ref Rng & Units 08/02/2019  Glucose 70 - 99 mg/dL 89  BUN 8 - 23 mg/dL 13  Creatinine 0.44 - 1.00 mg/dL 0.96  Sodium 135 - 145 mmol/L 140  Potassium 3.5 - 5.1 mmol/L 3.7  Chloride 98 - 111 mmol/L 106  CO2 22 - 32 mmol/L 26  Calcium 8.9 - 10.3 mg/dL 9.0  Total Protein 6.5 - 8.1 g/dL 6.8  Total Bilirubin 0.3 - 1.2 mg/dL 0.5  Alkaline Phos 38 - 126 U/L 57  AST 15 - 41 U/L 14(L)  ALT 0 - 44 U/L 8   CBC Latest Ref Rng & Units 11/21/2019  WBC 4.0 - 10.5 K/uL 10.5  Hemoglobin 12.0 - 15.0 g/dL 12.4  Hematocrit 36.0 - 46.0 % 40.4  Platelets 150 - 400 K/uL 76(L)     Observation/objective: appears in no acute distress over video visit today. Breathing is non labored  Assessment and plan: Patient is a 64 year old female with following issues:  1. 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 Platelet counts improved to 76 today. No indication for steroids/ rituxan yet.   2. Recurrent DVT- will repeat aps labs in 1 month. 1st set of labs were concerning for antiphospholipid antibody syndrome. She is currently on eliquis and will continue that  3. Iron deficiency anemia- hb improved to 12.4. no need for IV iron at this time. Repeat iron studies with next set of labs.  Follow-up instructions:labs in 1 month and 2 months. Video visit in 2 months  I discussed the assessment and treatment plan with the patient. The patient was provided an opportunity to ask questions and all were answered. The patient agreed with the plan and demonstrated an understanding of the instructions.   The patient was advised to call back or seek an in-person evaluation if the symptoms worsen or if the condition fails to improve as anticipated.   Visit Diagnosis: 1. History of DVT of lower extremity   2. Iron  deficiency anemia,  unspecified iron deficiency anemia type   3. Thrombocytopenia ([place])     Dr. Randa Evens, MD, MPH [facility] at [facility] Tel- [phone] 11/24/2019 11:30 AM

## 2019-11-28 DIAGNOSIS — H4051X0 Glaucoma secondary to other eye disorders, right eye, stage unspecified: Secondary | ICD-10-CM | POA: Insufficient documentation

## 2019-12-06 ENCOUNTER — Telehealth: Payer: Self-pay | Admitting: *Deleted

## 2019-12-06 NOTE — Telephone Encounter (Signed)
Patient called to report that she had eye surgery 1/18 and again this past Thursday and is going to have it again this Thursday for blood behind her eye. Just wanted Dr Janese Banks to be aware

## 2019-12-11 ENCOUNTER — Other Ambulatory Visit: Payer: Self-pay | Admitting: *Deleted

## 2019-12-11 DIAGNOSIS — H42 Glaucoma in diseases classified elsewhere: Secondary | ICD-10-CM

## 2019-12-11 DIAGNOSIS — H53131 Sudden visual loss, right eye: Secondary | ICD-10-CM

## 2019-12-14 ENCOUNTER — Other Ambulatory Visit: Payer: Self-pay

## 2019-12-15 ENCOUNTER — Other Ambulatory Visit: Payer: Self-pay

## 2019-12-15 ENCOUNTER — Inpatient Hospital Stay: Payer: Medicare Other | Attending: Oncology

## 2019-12-15 DIAGNOSIS — Z7901 Long term (current) use of anticoagulants: Secondary | ICD-10-CM | POA: Insufficient documentation

## 2019-12-15 DIAGNOSIS — E119 Type 2 diabetes mellitus without complications: Secondary | ICD-10-CM | POA: Insufficient documentation

## 2019-12-15 DIAGNOSIS — D696 Thrombocytopenia, unspecified: Secondary | ICD-10-CM | POA: Insufficient documentation

## 2019-12-15 DIAGNOSIS — H409 Unspecified glaucoma: Secondary | ICD-10-CM | POA: Diagnosis not present

## 2019-12-15 DIAGNOSIS — I10 Essential (primary) hypertension: Secondary | ICD-10-CM | POA: Diagnosis not present

## 2019-12-15 DIAGNOSIS — R5381 Other malaise: Secondary | ICD-10-CM | POA: Insufficient documentation

## 2019-12-15 DIAGNOSIS — M1990 Unspecified osteoarthritis, unspecified site: Secondary | ICD-10-CM | POA: Diagnosis not present

## 2019-12-15 DIAGNOSIS — Z79899 Other long term (current) drug therapy: Secondary | ICD-10-CM | POA: Diagnosis not present

## 2019-12-15 DIAGNOSIS — D509 Iron deficiency anemia, unspecified: Secondary | ICD-10-CM | POA: Diagnosis present

## 2019-12-15 DIAGNOSIS — Z7951 Long term (current) use of inhaled steroids: Secondary | ICD-10-CM | POA: Insufficient documentation

## 2019-12-15 DIAGNOSIS — F329 Major depressive disorder, single episode, unspecified: Secondary | ICD-10-CM | POA: Diagnosis not present

## 2019-12-15 DIAGNOSIS — M797 Fibromyalgia: Secondary | ICD-10-CM | POA: Diagnosis not present

## 2019-12-15 DIAGNOSIS — M329 Systemic lupus erythematosus, unspecified: Secondary | ICD-10-CM | POA: Insufficient documentation

## 2019-12-15 DIAGNOSIS — J449 Chronic obstructive pulmonary disease, unspecified: Secondary | ICD-10-CM | POA: Insufficient documentation

## 2019-12-15 DIAGNOSIS — Z87891 Personal history of nicotine dependence: Secondary | ICD-10-CM | POA: Insufficient documentation

## 2019-12-15 DIAGNOSIS — Z86718 Personal history of other venous thrombosis and embolism: Secondary | ICD-10-CM | POA: Diagnosis not present

## 2019-12-15 DIAGNOSIS — R5383 Other fatigue: Secondary | ICD-10-CM | POA: Diagnosis not present

## 2019-12-15 LAB — CBC WITH DIFFERENTIAL/PLATELET
Abs Immature Granulocytes: 0.01 10*3/uL (ref 0.00–0.07)
Basophils Absolute: 0.1 10*3/uL (ref 0.0–0.1)
Basophils Relative: 1 %
Eosinophils Absolute: 0.1 10*3/uL (ref 0.0–0.5)
Eosinophils Relative: 1 %
HCT: 38.1 % (ref 36.0–46.0)
Hemoglobin: 11.8 g/dL — ABNORMAL LOW (ref 12.0–15.0)
Immature Granulocytes: 0 %
Lymphocytes Relative: 28 %
Lymphs Abs: 1.9 10*3/uL (ref 0.7–4.0)
MCH: 29.1 pg (ref 26.0–34.0)
MCHC: 31 g/dL (ref 30.0–36.0)
MCV: 93.8 fL (ref 80.0–100.0)
Monocytes Absolute: 0.7 10*3/uL (ref 0.1–1.0)
Monocytes Relative: 10 %
Neutro Abs: 4.1 10*3/uL (ref 1.7–7.7)
Neutrophils Relative %: 60 %
Platelets: 74 10*3/uL — ABNORMAL LOW (ref 150–400)
RBC: 4.06 MIL/uL (ref 3.87–5.11)
RDW: 14.6 % (ref 11.5–15.5)
WBC: 6.9 10*3/uL (ref 4.0–10.5)
nRBC: 0 % (ref 0.0–0.2)

## 2019-12-22 ENCOUNTER — Other Ambulatory Visit: Payer: Self-pay

## 2019-12-22 ENCOUNTER — Ambulatory Visit
Admission: RE | Admit: 2019-12-22 | Discharge: 2019-12-22 | Disposition: A | Discharge status: Home / Self Care | Payer: Medicare Other | Source: Ambulatory Visit | Attending: Oncology | Admitting: Oncology

## 2019-12-22 DIAGNOSIS — H53131 Sudden visual loss, right eye: Secondary | ICD-10-CM | POA: Insufficient documentation

## 2019-12-22 DIAGNOSIS — H42 Glaucoma in diseases classified elsewhere: Secondary | ICD-10-CM | POA: Insufficient documentation

## 2019-12-22 LAB — LUPUS ANTICOAGULANT
DRVVT: >180 s — ABNORMAL HIGH (ref 0.0–47.0)
PTT Lupus Anticoagulant: 54.3 s — ABNORMAL HIGH (ref 0.0–51.9)
Thrombin Time: 20.7 s (ref 0.0–23.0)
dPT Confirm Ratio: 1.01 Ratio (ref 0.00–1.40)
dPT: 70.8 s — ABNORMAL HIGH (ref 0.0–55.0)

## 2019-12-22 LAB — HEXAGONAL PHASE PHOSPHOLIPID: Hexagonal Phase Phospholipid: 42 s — ABNORMAL HIGH (ref 0–11)

## 2019-12-22 LAB — CARDIOLIPIN ANTIBODIES, IGM+IGG
Anticardiolipin IgG: 74 GPL U/mL — ABNORMAL HIGH (ref 0–14)
Anticardiolipin IgM: 17 MPL U/mL — ABNORMAL HIGH (ref 0–12)

## 2019-12-22 LAB — BETA-2-GLYCOPROTEIN I ABS, IGG/M/A
Beta-2 Glyco I IgG: 42 GPI IgG units — ABNORMAL HIGH (ref 0–20)
Beta-2-Glycoprotein I IgA: 20 GPI IgA units (ref 0–25)
Beta-2-Glycoprotein I IgM: 12 GPI IgM units (ref 0–32)

## 2019-12-22 LAB — PTT-LA MIX: PTT-LA Mix: 47.8 s (ref 0.0–48.9)

## 2019-12-22 LAB — DRVVT MIX: dRVVT Mix: 96.4 s — ABNORMAL HIGH (ref 0.0–40.4)

## 2019-12-22 LAB — PTT-LA INCUB MIX: PTT-LA Incub Mix: 56 s — ABNORMAL HIGH (ref 0.0–48.9)

## 2019-12-22 LAB — DRVVT CONFIRM: dRVVT Confirm: >2.2 ratio — ABNORMAL HIGH (ref 0.8–1.2)

## 2019-12-23 ENCOUNTER — Other Ambulatory Visit: Payer: Medicare Other

## 2019-12-27 ENCOUNTER — Encounter: Payer: Self-pay | Admitting: Oncology

## 2019-12-27 ENCOUNTER — Inpatient Hospital Stay (HOSPITAL_BASED_OUTPATIENT_CLINIC_OR_DEPARTMENT_OTHER): Payer: Medicare Other | Admitting: Oncology

## 2019-12-27 ENCOUNTER — Other Ambulatory Visit: Payer: Self-pay

## 2019-12-27 DIAGNOSIS — D509 Iron deficiency anemia, unspecified: Secondary | ICD-10-CM

## 2019-12-27 DIAGNOSIS — D696 Thrombocytopenia, unspecified: Secondary | ICD-10-CM | POA: Diagnosis not present

## 2019-12-27 NOTE — Progress Notes (Signed)
Patient stated that she had been doing well. Patient stated that she has a hard time sleeping at bedtime and that she would try taking Melatonin as recommended by physician. Patient would like to know what her image results and blood work were.

## 2019-12-29 NOTE — Progress Notes (Signed)
I connected with Alison Lopez on 12/29/19 at 10:30 AM EST by video enabled telemedicine visit and verified that I am speaking with the correct person using two identifiers.   I discussed the limitations, risks, security and privacy concerns of performing an evaluation and management service by telemedicine and the availability of in-person appointments. I also discussed with the patient that there may be a patient responsible charge related to this service. The patient expressed understanding and agreed to proceed.  Other persons participating in the visit and their role in the encounter:  none  Patient's location:  home Provider's location:  work  Risk analyst Complaint: Discuss results of blood work  History of present illness: Patient is a 64 yr old female with prior h/o LLEPROXIMALDVT back in 1993 after an episode of pneumonia. Patient was started on coumadin back then and stayed on it. On 04/20/19 patient noticed pain and tightness in her RLE. She came to the ER and USG showed extensive right calf, femoral and popliteal DVT. INR was therapeutic at 2.2 on this day. Patient was switched to eliquis. Also seen by vascular surgery and underwent thrombectomy/ PTA and IVC filter placement. Patient reports intermittent headaches and nausea since she started taking eliquis. RLE swelling is getting better. She denies any DVT/PE in the 27 years she was on coumadin no pregnancy losses. No family h/o thrombosis. She has 2 adult children with no h/o DVT  At the time of DVT patient also noted to have iron deficiency anemia and thrombocytopenia. Platelet counts have been normal in 2018. Recent H/H was 7.7/25.3 and platelet count was 72. She did receive 1 dose of IV iron and currently she is taking oral iron. She denies any blood in stools. 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.  She currently denies any blood in her stool or urine.   Review of Systems  Constitutional: Positive for malaise/fatigue. Negative for chills, fever and weight loss.  HENT: Negative for congestion, ear discharge and nosebleeds.   Eyes: Negative for blurred vision.  Respiratory: Negative for cough, hemoptysis, sputum production, shortness of breath and wheezing.   Cardiovascular: Negative for chest pain, palpitations, orthopnea and claudication.  Gastrointestinal: Negative for abdominal pain, blood in stool, constipation, diarrhea, heartburn, melena, nausea and vomiting.  Genitourinary: Negative for dysuria, flank pain, frequency, hematuria and urgency.  Musculoskeletal: Negative for back pain, joint pain and myalgias.  Skin: Negative for rash.  Neurological: Negative for dizziness, tingling, focal weakness, seizures, weakness and headaches.  Endo/Heme/Allergies: Does not bruise/bleed easily.  Psychiatric/Behavioral: Negative for depression and suicidal ideas. The patient does not have insomnia.     Allergies  Allergen Reactions  . Morphine And Related Anaphylaxis  . Aspirin Other (See Comments)    Patient on blood thinners and was told not to take  . Benadryl [Diphenhydramine Hcl] Hives  . Codeine Other (See Comments)    AMS    Past Medical History:  Diagnosis Date  . Arthritis   . Collagen vascular disease ([place])   . COPD (chronic obstructive pulmonary disease) ([place])   . Depression   . Diabetes mellitus without complication ([place])   . DVT (deep venous thrombosis) ([place])   . Fibromyalgia   . Hypertension   . Lupus ([place])   . Osteoporosis     Past Surgical History:  Procedure Laterality Date  . ABDOMINAL HYSTERECTOMY    .  IVC FILTER REMOVAL N/A 07/21/2019   Procedure: IVC FILTER REMOVAL;  Surgeon: Algernon Huxley, MD;  Location: [facility];  Service: Cardiovascular;  Laterality: N/A;  . LOWER EXTREMITY INTERVENTION Right 04/22/2019   Procedure:  IVC Filter Insertion with Right Lower Extremity Venous Lysis;  Surgeon: Algernon Huxley, MD;  Location: [facility];  Service: Cardiovascular;  Laterality: Right;  . OOPHORECTOMY      Social History   Socioeconomic History  . Marital status: Divorced    Spouse name: Not on file  . Number of children: Not on file  . Years of education: Not on file  . Highest education level: Not on file  Occupational History  . Not on file  Tobacco Use  . Smoking status: Former Smoker    Packs/day: 1.00    Years: 17.00    Pack years: 17.00    Types: Cigarettes    Quit date: 2002    Years since quitting: 19.1  . Smokeless tobacco: Never Used  Substance and Sexual Activity  . Alcohol use: Not Currently  . Drug use: Never  . Sexual activity: Not on file  Other Topics Concern  . Not on file  Social History Narrative  . Not on file   Social Determinants of Health   Financial Resource Strain:   . Difficulty of Paying Living Expenses: Not on file  Food Insecurity:   . Worried About Charity fundraiser in the Last Year: Not on file  . Ran Out of Food in the Last Year: Not on file  Transportation Needs:   . Lack of Transportation (Medical): Not on file  . Lack of Transportation (Non-Medical): Not on file  Physical Activity:   . Days of Exercise per Week: Not on file  . Minutes of Exercise per Session: Not on file  Stress:   . Feeling of Stress : Not on file  Social Connections:   . Frequency of Communication with Friends and Family: Not on file  . Frequency of Social Gatherings with Friends and Family: Not on file  . Attends Religious Services: Not on file  . Active Member of Clubs or Organizations: Not on file  . Attends Archivist Meetings: Not on file  . Marital Status: Not on file  Intimate Partner Violence:   . Fear of Current or Ex-Partner: Not on file  . Emotionally Abused: Not on file  . Physically Abused: Not on file  . Sexually Abused: Not on file    Family  History  Problem Relation Age of Onset  . Stomach cancer Mother   . Heart attack Father   . Breast cancer Neg Hx      Current Outpatient Medications:  .  acetaminophen (TYLENOL) 650 MG CR tablet, Take 650 mg by mouth every 8 (eight) hours as needed for pain., Disp: , Rfl:  .  albuterol (PROVENTIL) (2.5 MG/3ML) 0.083% nebulizer solution, Take 2.5 mg by nebulization every 4 (four) hours as needed for wheezing or shortness of breath. , Disp: , Rfl:  .  albuterol (VENTOLIN HFA) 108 (90 Base) MCG/ACT inhaler, Inhale 2 puffs into the lungs every 4 (four) hours as needed for wheezing or shortness of breath., Disp: , Rfl:  .  apixaban (ELIQUIS) 5 MG TABS tablet, Take 1 tablet (5 mg total) by mouth 2 (two) times daily., Disp: 60 tablet, Rfl: 2 .  ascorbic acid (VITAMIN C) 100 MG tablet, Take 1,000 mg by mouth daily., Disp: , Rfl:  .  atropine 1 % ophthalmic solution, Place 1 drop into the right eye in the morning and at bedtime., Disp: , Rfl:  .  buPROPion (WELLBUTRIN SR) 150 MG 12 hr tablet, Take 150 mg by mouth daily., Disp: , Rfl:  .  DULoxetine (CYMBALTA) 20 MG capsule, Take 1 capsule by mouth 1 day or 1 dose., Disp: , Rfl:  .  ergocalciferol (VITAMIN D2) 1.25 MG (50000 UT) capsule, Take 50,000 Units by mouth every Friday. , Disp: , Rfl:  .  erythromycin ophthalmic ointment, Place 1 application into both eyes at bedtime., Disp: , Rfl:  .  esomeprazole (NEXIUM) 40 MG capsule, Take by mouth., Disp: , Rfl:  .  ferrous sulfate 325 (65 FE) MG tablet, Take by mouth., Disp: , Rfl:  .  furosemide (LASIX) 20 MG tablet, Take 1 tablet by mouth daily as needed., Disp: , Rfl:  .  lansoprazole (PREVACID) 30 MG capsule, Take 30 mg by mouth daily., Disp: , Rfl:  .  levothyroxine (SYNTHROID, LEVOTHROID) 88 MCG tablet, Take 75 mcg by mouth daily before breakfast. , Disp: , Rfl:  .  metFORMIN (GLUCOPHAGE) 500 MG tablet, Take 500 mg by mouth daily. , Disp: , Rfl:  .  mometasone (NASONEX) 50 MCG/ACT nasal spray,  Place 2 sprays into the nose daily., Disp: , Rfl:  .  predniSONE (DELTASONE) 10 MG tablet, Take 5 mg by mouth daily with breakfast. , Disp: , Rfl:  .  simvastatin (ZOCOR) 40 MG tablet, Take 40 mg by mouth daily., Disp: , Rfl:  .  SYMBICORT 160-4.5 MCG/ACT inhaler, , Disp: , Rfl:  .  cyclobenzaprine (FLEXERIL) 5 MG tablet, Take 5 mg by mouth 3 (three) times daily as needed for muscle spasms., Disp: , Rfl:  .  losartan (COZAAR) 50 MG tablet, Take 50 mg by mouth daily. prn, Disp: , Rfl:   US Carotid Bilateral  Result Date: 12/22/2019 CLINICAL DATA:  Sudden vision loss involving the right thigh. History of stroke/TIA, hypertension, syncopal episode and diabetes. EXAM: BILATERAL CAROTID DUPLEX ULTRASOUND TECHNIQUE: Pearline Cables scale imaging, color Doppler and duplex ultrasound were performed of bilateral carotid and vertebral arteries in the neck. COMPARISON:  None. FINDINGS: Criteria: Quantification of carotid stenosis is based on velocity parameters that correlate the residual internal carotid diameter with NASCET-based stenosis levels, using the diameter of the distal internal carotid lumen as the denominator for stenosis measurement. The following velocity measurements were obtained: RIGHT ICA: 70/31 cm/sec CCA: [id number] cm/sec SYSTOLIC ICA/CCA RATIO:  1.0 ECA: 38 cm/sec LEFT ICA: 58/28 cm/sec CCA: [id number] cm/sec SYSTOLIC ICA/CCA RATIO:  1.0 ECA: 41 cm/sec RIGHT CAROTID ARTERY: There is no grayscale evidence of significant intimal thickening or atherosclerotic plaque affecting the interrogated portions of the right carotid system. There are no elevated peak systolic velocities within the interrogated course of the right internal carotid artery to suggest a hemodynamically significant stenosis. RIGHT VERTEBRAL ARTERY:  Antegrade flow LEFT CAROTID ARTERY: There is a moderate amount of eccentric echogenic partially shadowing plaque within the left carotid bulb (images 47 and 49), extending to involve the origin and  proximal aspects of the left internal carotid artery (image 57), not resulting in elevated peak systolic velocities within the interrogated course of the left internal carotid artery to suggest a hemodynamically significant stenosis. LEFT VERTEBRAL ARTERY:  Antegrade flow IMPRESSION: 1. Moderate amount of left-sided atherosclerotic plaque, not resulting in a hemodynamically significant stenosis within the left internal carotid artery. 2. Unremarkable sonographic evaluation of the right carotid system. Electronically Signed  By: Sandi Mariscal M.D.   On: 12/22/2019 16:26    No images are attached to the encounter.   CMP Latest Ref Rng & Units 08/02/2019  Glucose 70 - 99 mg/dL 89  BUN 8 - 23 mg/dL 13  Creatinine 0.44 - 1.00 mg/dL 0.96  Sodium 135 - 145 mmol/L 140  Potassium 3.5 - 5.1 mmol/L 3.7  Chloride 98 - 111 mmol/L 106  CO2 22 - 32 mmol/L 26  Calcium 8.9 - 10.3 mg/dL 9.0  Total Protein 6.5 - 8.1 g/dL 6.8  Total Bilirubin 0.3 - 1.2 mg/dL 0.5  Alkaline Phos 38 - 126 U/L 57  AST 15 - 41 U/L 14(L)  ALT 0 - 44 U/L 8   CBC Latest Ref Rng & Units 12/15/2019  WBC 4.0 - 10.5 K/uL 6.9  Hemoglobin 12.0 - 15.0 g/dL 11.8(L)  Hematocrit 36.0 - 46.0 % 38.1  Platelets 150 - 400 K/uL 74(L)     Observation/objective: appears in no acute distress over video visit today. Breathing is non labored  Assessment and plan: Patient is a 64 year old female with following issues:  1.  Recurrent DVT: I explained to the patient that she had repeat APS labs done and 2 sets of labs now confirm that she has triple positive antiphospholipid antibody syndrome.  Ideally Coumadin is superior to newer anticoagulants in this setting.  However patient had a right lower extremity DVT that was extensive in June 2020 while she was therapeutic on Coumadin.  INR checked on the day of her Doppler as well as a month prior was therapeutic.  I do not have any repeat INR values between May and June 2020.  Historically her INRs have  been mostly between 2-3.  I am therefore hesitant to put her back on Coumadin.  She currently continues to be on Eliquis.  I also got in touch with ophthalmology at [facility] to see if her recent episode of glaucoma/hyphema was a thrombotic event and they are not able to see that for certain 1 way or the other at this time.  It is therefore not clear if she truly had a thrombotic event on Eliquis that would warrant a change.  Another option is lifelong Arixtra but patient is hesitant to go on shots for the rest of her life.  I had previously wanted the patient to go for a second opinion and see Dr. Tacey Ruiz and patient had cancelled that appointment. I would like her to go back for this appointment at this time. She is agreeable  2.  Thrombocytopenia: Currently platelets are stable in the 70s.  Continue to monitor  Follow-up instructions: I will see her back in 1 month with CBC with differential  I discussed the assessment and treatment plan with the patient. The patient was provided an opportunity to ask questions and all were answered. The patient agreed with the plan and demonstrated an understanding of the instructions.   The patient was advised to call back or seek an in-person evaluation if the symptoms worsen or if the condition fails to improve as anticipated.   Visit Diagnosis: 1. Iron deficiency anemia, unspecified iron deficiency anemia type   2. Thrombocytopenia ([place])     Dr. Randa Evens, MD, MPH [facility] at [facility] Tel- [phone] 12/29/2019 3:32 PM

## 2019-12-30 ENCOUNTER — Ambulatory Visit
Admission: RE | Admit: 2019-12-30 | Discharge: 2019-12-30 | Disposition: A | Discharge status: Home / Self Care | Payer: Medicare Other | Source: Ambulatory Visit | Attending: Family Medicine | Admitting: Family Medicine

## 2019-12-30 DIAGNOSIS — Z1231 Encounter for screening mammogram for malignant neoplasm of breast: Secondary | ICD-10-CM | POA: Insufficient documentation

## 2020-01-17 ENCOUNTER — Ambulatory Visit: Payer: Medicare Other | Admitting: Oncology

## 2020-01-17 ENCOUNTER — Other Ambulatory Visit: Payer: Medicare Other

## 2020-01-20 ENCOUNTER — Other Ambulatory Visit: Payer: Medicare Other

## 2020-01-23 ENCOUNTER — Telehealth: Payer: Medicare Other | Admitting: Oncology

## 2020-01-23 ENCOUNTER — Other Ambulatory Visit: Payer: Medicare Other

## 2020-01-23 ENCOUNTER — Ambulatory Visit: Payer: Medicare Other | Admitting: Oncology

## 2020-02-02 ENCOUNTER — Ambulatory Visit: Payer: Medicare Other | Attending: Internal Medicine

## 2020-02-02 DIAGNOSIS — Z23 Encounter for immunization: Secondary | ICD-10-CM

## 2020-02-02 NOTE — Progress Notes (Signed)
   Covid-19 Vaccination Clinic  Name:  Alison Lopez    MRN: [id number] DOB: 05/18/1956  02/02/2020  Ms. Ytuarte was observed post Covid-19 immunization for 15 minutes without incident. She was provided with Vaccine Information Sheet and instruction to access the V-Safe system.   Ms. Wolter was instructed to call 911 with any severe reactions post vaccine: Marland Kitchen Difficulty breathing  . Swelling of face and throat  . A fast heartbeat  . A bad rash all over body  . Dizziness and weakness   Immunizations Administered    Name Date Dose VIS Date Route   Pfizer COVID-19 Vaccine 02/02/2020  9:14 AM 0.3 mL 10/21/2019 Intramuscular   Manufacturer: [place]   Lot: [id number]   [place]: [id number]

## 2020-02-03 ENCOUNTER — Inpatient Hospital Stay: Payer: Medicare Other | Attending: Oncology

## 2020-02-03 ENCOUNTER — Inpatient Hospital Stay (HOSPITAL_BASED_OUTPATIENT_CLINIC_OR_DEPARTMENT_OTHER): Payer: Medicare Other | Admitting: Oncology

## 2020-02-03 ENCOUNTER — Encounter: Payer: Self-pay | Admitting: Oncology

## 2020-02-03 VITALS — BP 123/82 | HR 63 | Temp 97.1°F | Wt 118.0 lb

## 2020-02-03 DIAGNOSIS — E119 Type 2 diabetes mellitus without complications: Secondary | ICD-10-CM | POA: Diagnosis not present

## 2020-02-03 DIAGNOSIS — Z7952 Long term (current) use of systemic steroids: Secondary | ICD-10-CM | POA: Insufficient documentation

## 2020-02-03 DIAGNOSIS — M810 Age-related osteoporosis without current pathological fracture: Secondary | ICD-10-CM | POA: Insufficient documentation

## 2020-02-03 DIAGNOSIS — D696 Thrombocytopenia, unspecified: Secondary | ICD-10-CM

## 2020-02-03 DIAGNOSIS — Z8701 Personal history of pneumonia (recurrent): Secondary | ICD-10-CM | POA: Insufficient documentation

## 2020-02-03 DIAGNOSIS — D693 Immune thrombocytopenic purpura: Secondary | ICD-10-CM | POA: Diagnosis not present

## 2020-02-03 DIAGNOSIS — D6861 Antiphospholipid syndrome: Secondary | ICD-10-CM | POA: Diagnosis not present

## 2020-02-03 DIAGNOSIS — D509 Iron deficiency anemia, unspecified: Secondary | ICD-10-CM | POA: Diagnosis not present

## 2020-02-03 DIAGNOSIS — R5381 Other malaise: Secondary | ICD-10-CM | POA: Insufficient documentation

## 2020-02-03 DIAGNOSIS — Z86718 Personal history of other venous thrombosis and embolism: Secondary | ICD-10-CM

## 2020-02-03 DIAGNOSIS — Z87891 Personal history of nicotine dependence: Secondary | ICD-10-CM | POA: Diagnosis not present

## 2020-02-03 DIAGNOSIS — I10 Essential (primary) hypertension: Secondary | ICD-10-CM | POA: Insufficient documentation

## 2020-02-03 DIAGNOSIS — M329 Systemic lupus erythematosus, unspecified: Secondary | ICD-10-CM | POA: Insufficient documentation

## 2020-02-03 DIAGNOSIS — M797 Fibromyalgia: Secondary | ICD-10-CM | POA: Diagnosis not present

## 2020-02-03 DIAGNOSIS — Z7951 Long term (current) use of inhaled steroids: Secondary | ICD-10-CM | POA: Insufficient documentation

## 2020-02-03 DIAGNOSIS — D649 Anemia, unspecified: Secondary | ICD-10-CM

## 2020-02-03 DIAGNOSIS — R5383 Other fatigue: Secondary | ICD-10-CM | POA: Insufficient documentation

## 2020-02-03 DIAGNOSIS — J449 Chronic obstructive pulmonary disease, unspecified: Secondary | ICD-10-CM | POA: Diagnosis not present

## 2020-02-03 DIAGNOSIS — Z79899 Other long term (current) drug therapy: Secondary | ICD-10-CM | POA: Insufficient documentation

## 2020-02-03 LAB — FERRITIN: Ferritin: 33 ng/mL (ref 11–307)

## 2020-02-03 LAB — CBC WITH DIFFERENTIAL/PLATELET
Abs Immature Granulocytes: 0.01 10*3/uL (ref 0.00–0.07)
Basophils Absolute: 0.1 10*3/uL (ref 0.0–0.1)
Basophils Relative: 2 %
Eosinophils Absolute: 0.2 10*3/uL (ref 0.0–0.5)
Eosinophils Relative: 3 %
HCT: 32.4 % — ABNORMAL LOW (ref 36.0–46.0)
Hemoglobin: 10.5 g/dL — ABNORMAL LOW (ref 12.0–15.0)
Immature Granulocytes: 0 %
Lymphocytes Relative: 22 %
Lymphs Abs: 1.4 10*3/uL (ref 0.7–4.0)
MCH: 28.9 pg (ref 26.0–34.0)
MCHC: 32.4 g/dL (ref 30.0–36.0)
MCV: 89.3 fL (ref 80.0–100.0)
Monocytes Absolute: 0.6 10*3/uL (ref 0.1–1.0)
Monocytes Relative: 9 %
Neutro Abs: 4.2 10*3/uL (ref 1.7–7.7)
Neutrophils Relative %: 64 %
Platelets: 48 10*3/uL — ABNORMAL LOW (ref 150–400)
RBC: 3.63 MIL/uL — ABNORMAL LOW (ref 3.87–5.11)
RDW: 13.2 % (ref 11.5–15.5)
WBC: 6.5 10*3/uL (ref 4.0–10.5)
nRBC: 0 % (ref 0.0–0.2)

## 2020-02-03 LAB — IRON AND TIBC
Iron: 65 ug/dL (ref 28–170)
Saturation Ratios: 16 % (ref 10.4–31.8)
TIBC: 405 ug/dL (ref 250–450)
UIBC: 340 ug/dL

## 2020-02-03 NOTE — Progress Notes (Signed)
Patient stated that she had been doing well even though she has glaucoma. Patient stated that she had lost about 15 lbs since food does not taste good.

## 2020-02-07 NOTE — Progress Notes (Signed)
Hematology/Oncology Consult note [facility]  Telephone:([phone] [phone]  Patient Care Team: Sharyne Peach, MD as PCP - General (Family Medicine)   Name of the patient: Alison Lopez  [id number]  1956/02/20   Date of visit: 02/07/20  Diagnosis- 1. Right lower extremity DVT 2. Iron deficiency anemia 3. ITP   Chief complaint/ Reason for visit-routine follow-up of recurrent DVT and thrombocytopenia  Heme/Onc history: Patient is a 64 yr old female with prior h/o LLEPROXIMALDVT back in 1993 after an episode of pneumonia. Patient was started on coumadin back then and stayed on it. On 04/20/19 patient noticed pain and tightness in her RLE. She came to the ER and USG showed extensive right calf, femoral and popliteal DVT. INR was therapeutic at 2.2 on this day. Patient was switched to eliquis. Also seen by vascular surgery and underwent thrombectomy/ PTA and IVC filter placement. Patient reports intermittent headaches and nausea since she started taking eliquis. RLE swelling is getting better. She denies any DVT/PE in the 27 years she was on coumadin no pregnancy losses. No family h/o thrombosis. She has 2 adult children with no h/o DVT  At the time of DVT patient also noted to have iron deficiency anemia and thrombocytopenia. Platelet counts have been normal in 2018. Recent H/H was 7.7/25.3 and platelet count was 72. She did receive 1 dose of IV iron and currently she is taking oral iron. She denies any blood in stools. Stools have beendark since she started taking po iron  Interval history-patient cannot see much with her right eye and mainly relies on her left eye for her vision.  Reports being compliant with Eliquis.  Denies any bleeding issues  ECOG PS- 1 Pain scale- 0   Review of systems- Review of Systems  Constitutional: Positive for malaise/fatigue. Negative for chills, fever and weight loss.  HENT: Negative for congestion, ear  discharge and nosebleeds.   Eyes: Negative for blurred vision.       Loss of vision in the right eye  Respiratory: Negative for cough, hemoptysis, sputum production, shortness of breath and wheezing.   Cardiovascular: Negative for chest pain, palpitations, orthopnea and claudication.  Gastrointestinal: Negative for abdominal pain, blood in stool, constipation, diarrhea, heartburn, melena, nausea and vomiting.  Genitourinary: Negative for dysuria, flank pain, frequency, hematuria and urgency.  Musculoskeletal: Negative for back pain, joint pain and myalgias.  Skin: Negative for rash.  Neurological: Negative for dizziness, tingling, focal weakness, seizures, weakness and headaches.  Endo/Heme/Allergies: Does not bruise/bleed easily.  Psychiatric/Behavioral: Negative for depression and suicidal ideas. The patient does not have insomnia.        Allergies  Allergen Reactions  . Morphine And Related Anaphylaxis  . Aspirin Other (See Comments)    Patient on blood thinners and was told not to take  . Benadryl [Diphenhydramine Hcl] Hives  . Codeine Other (See Comments)    AMS     Past Medical History:  Diagnosis Date  . Arthritis   . Collagen vascular disease ([place])   . COPD (chronic obstructive pulmonary disease) ([facility])   . Depression   . Diabetes mellitus without complication ([place])   . DVT (deep venous thrombosis) ([place])   . Fibromyalgia   . Hypertension   . Lupus ([place])   . Osteoporosis      Past Surgical History:  Procedure Laterality Date  . ABDOMINAL HYSTERECTOMY    . IVC FILTER REMOVAL N/A 07/21/2019   Procedure: IVC FILTER REMOVAL;  Surgeon:  Algernon Huxley, MD;  Location: [place] [facility];  Service: Cardiovascular;  Laterality: N/A;  . LOWER EXTREMITY INTERVENTION Right 04/22/2019   Procedure: IVC Filter Insertion with Right Lower Extremity Venous Lysis;  Surgeon: Algernon Huxley, MD;  Location: [place] [facility];  Service: Cardiovascular;  Laterality: Right;  .  OOPHORECTOMY      Social History   Socioeconomic History  . Marital status: Divorced    Spouse name: Not on file  . Number of children: Not on file  . Years of education: Not on file  . Highest education level: Not on file  Occupational History  . Not on file  Tobacco Use  . Smoking status: Former Smoker    Packs/day: 1.00    Years: 17.00    Pack years: 17.00    Types: Cigarettes    Quit date: 2002    Years since quitting: 19.2  . Smokeless tobacco: Never Used  Substance and Sexual Activity  . Alcohol use: Not Currently  . Drug use: Never  . Sexual activity: Not on file  Other Topics Concern  . Not on file  Social History Narrative  . Not on file   Social Determinants of Health   Financial Resource Strain:   . Difficulty of Paying Living Expenses:   Food Insecurity:   . Worried About Charity fundraiser in the Last Year:   . Arboriculturist in the Last Year:   Transportation Needs:   . Film/video editor (Medical):   Marland Kitchen Lack of Transportation (Non-Medical):   Physical Activity:   . Days of Exercise per Week:   . Minutes of Exercise per Session:   Stress:   . Feeling of Stress :   Social Connections:   . Frequency of Communication with Friends and Family:   . Frequency of Social Gatherings with Friends and Family:   . Attends Religious Services:   . Active Member of Clubs or Organizations:   . Attends Archivist Meetings:   Marland Kitchen Marital Status:   Intimate Partner Violence:   . Fear of Current or Ex-Partner:   . Emotionally Abused:   Marland Kitchen Physically Abused:   . Sexually Abused:     Family History  Problem Relation Age of Onset  . Stomach cancer Mother   . Heart attack Father   . Breast cancer Neg Hx      Current Outpatient Medications:  .  acetaminophen (TYLENOL) 650 MG CR tablet, Take 650 mg by mouth every 8 (eight) hours as needed for pain., Disp: , Rfl:  .  albuterol (PROVENTIL) (2.5 MG/3ML) 0.083% nebulizer solution, Take 2.5 mg by  nebulization every 4 (four) hours as needed for wheezing or shortness of breath. , Disp: , Rfl:  .  albuterol (VENTOLIN HFA) 108 (90 Base) MCG/ACT inhaler, Inhale 2 puffs into the lungs every 4 (four) hours as needed for wheezing or shortness of breath., Disp: , Rfl:  .  apixaban (ELIQUIS) 5 MG TABS tablet, Take 1 tablet (5 mg total) by mouth 2 (two) times daily., Disp: 60 tablet, Rfl: 2 .  ascorbic acid (VITAMIN C) 100 MG tablet, Take 1,000 mg by mouth daily., Disp: , Rfl:  .  atropine 1 % ophthalmic solution, Place 1 drop into the right eye in the morning and at bedtime., Disp: , Rfl:  .  brimonidine-timolol (COMBIGAN) 0.2-0.5 % ophthalmic solution, Place 3 drops into both eyes every 12 (twelve) hours., Disp: , Rfl:  .  Brinzolamide-Brimonidine  1-0.2 % SUSP, Apply 2.5 mLs to eye 1 day or 1 dose., Disp: , Rfl:  .  buPROPion (WELLBUTRIN SR) 150 MG 12 hr tablet, Take 150 mg by mouth daily., Disp: , Rfl:  .  cyclobenzaprine (FLEXERIL) 5 MG tablet, Take 5 mg by mouth 3 (three) times daily as needed for muscle spasms., Disp: , Rfl:  .  ergocalciferol (VITAMIN D2) 1.25 MG (50000 UT) capsule, Take 50,000 Units by mouth every Friday. , Disp: , Rfl:  .  erythromycin ophthalmic ointment, Place 1 application into both eyes at bedtime., Disp: , Rfl:  .  esomeprazole (NEXIUM) 40 MG capsule, Take by mouth., Disp: , Rfl:  .  ferrous sulfate 325 (65 FE) MG tablet, Take by mouth., Disp: , Rfl:  .  levothyroxine (SYNTHROID, LEVOTHROID) 88 MCG tablet, Take 75 mcg by mouth daily before breakfast. , Disp: , Rfl:  .  metFORMIN (GLUCOPHAGE) 500 MG tablet, Take 500 mg by mouth daily. , Disp: , Rfl:  .  mometasone (NASONEX) 50 MCG/ACT nasal spray, Place 2 sprays into the nose daily., Disp: , Rfl:  .  neomycin-bacitracin-polymyxin (NEOSPORIN) ophthalmic ointment, Place 1 application into both eyes at bedtime., Disp: , Rfl:  .  prednisoLONE acetate (PRED FORTE) 1 % ophthalmic suspension, Place 1 drop into both eyes 1 day or  1 dose., Disp: , Rfl:  .  predniSONE (DELTASONE) 10 MG tablet, Take 5 mg by mouth daily with breakfast. , Disp: , Rfl:  .  simvastatin (ZOCOR) 40 MG tablet, Take 40 mg by mouth daily., Disp: , Rfl:  .  SYMBICORT 160-4.5 MCG/ACT inhaler, , Disp: , Rfl:  .  DULoxetine (CYMBALTA) 20 MG capsule, Take 1 capsule by mouth 1 day or 1 dose., Disp: , Rfl:  .  furosemide (LASIX) 20 MG tablet, Take 1 tablet by mouth daily as needed., Disp: , Rfl:  .  lansoprazole (PREVACID) 30 MG capsule, Take 30 mg by mouth daily., Disp: , Rfl:  .  losartan (COZAAR) 50 MG tablet, Take 50 mg by mouth daily. prn, Disp: , Rfl:   Physical exam:  Vitals:   02/03/20 0939  BP: 123/82  Pulse: 63  Temp: (!) 97.1 F (36.2 C)  TempSrc: Tympanic  Weight: 118 lb (53.5 kg)   Physical Exam Eyes:     Comments: Pupils are bilaterally dilated right greater than left  Cardiovascular:     Rate and Rhythm: Normal rate and regular rhythm.     Heart sounds: Normal heart sounds.  Pulmonary:     Effort: Pulmonary effort is normal.     Breath sounds: Normal breath sounds.  Abdominal:     General: Bowel sounds are normal.     Palpations: Abdomen is soft.  Skin:    General: Skin is warm and dry.  Neurological:     Mental Status: She is alert and oriented to person, place, and time.      CMP Latest Ref Rng & Units 08/02/2019  Glucose 70 - 99 mg/dL 89  BUN 8 - 23 mg/dL 13  Creatinine 0.44 - 1.00 mg/dL 0.96  Sodium 135 - 145 mmol/L 140  Potassium 3.5 - 5.1 mmol/L 3.7  Chloride 98 - 111 mmol/L 106  CO2 22 - 32 mmol/L 26  Calcium 8.9 - 10.3 mg/dL 9.0  Total Protein 6.5 - 8.1 g/dL 6.8  Total Bilirubin 0.3 - 1.2 mg/dL 0.5  Alkaline Phos 38 - 126 U/L 57  AST 15 - 41 U/L 14(L)  ALT 0 - 44  U/L 8   CBC Latest Ref Rng & Units 02/03/2020  WBC 4.0 - 10.5 K/uL 6.5  Hemoglobin 12.0 - 15.0 g/dL 10.5(L)  Hematocrit 36.0 - 46.0 % 32.4(L)  Platelets 150 - 400 K/uL 48(L)     Assessment and plan- Patient is a 64 y.o. female who is  here for follow-up of following issues:  1.  Recurrent DVT: Labs are consistent with antiphospholipid antibody syndrome.  She has had recurrent DVT however while she was therapeutic on Coumadin.  She is presently on Eliquis and we again discussed other options such as Lovenox or Arixtra.  Patient does not wish to go on long-term injections if possible.  She will also have a second opinion consult with Dr. Gita Kudo at [facility]  2.  Thrombocytopenia: This has been ongoing since June 2020.  Her platelet counts have been fluctuating between 50s to 70s since September 2020 and down to 48 today.  Given that she is on Eliquis I would like her platelet counts to be close to 50 and above.  I would like to repeat her platelet counts again in 2 weeks time.  If platelet count is consistently less than 50 I will consider giving her Decadron 40 mg x 4 days.  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  If patient does not respond to Decadron I will proceed with a bone marrow biopsy at that time  3.  Anemia: Overall stable between 10-11 since September 2020.I have done a complete anemia work-up in the past which showed some component of iron deficiency.  She did receive IV iron and repeat ferritin today is 33 with normal iron studies.  Continue to monitor  I will see her back in 2 months with a CBC with differential and CMP for a video visit.   Visit Diagnosis 1. Antiphospholipid antibody syndrome (HCC)   2. Normocytic anemia   3. Thrombocytopenia ([place])      Dr. Randa Evens, MD, MPH [facility] [phone] 02/07/2020 8:23 AM

## 2020-02-17 ENCOUNTER — Other Ambulatory Visit: Payer: Self-pay

## 2020-02-17 ENCOUNTER — Inpatient Hospital Stay: Payer: Medicare Other | Attending: Oncology

## 2020-02-17 DIAGNOSIS — D6861 Antiphospholipid syndrome: Secondary | ICD-10-CM | POA: Diagnosis not present

## 2020-02-17 LAB — CBC WITH DIFFERENTIAL/PLATELET
Abs Immature Granulocytes: 0.02 10*3/uL (ref 0.00–0.07)
Basophils Absolute: 0.1 10*3/uL (ref 0.0–0.1)
Basophils Relative: 1 %
Eosinophils Absolute: 0.2 10*3/uL (ref 0.0–0.5)
Eosinophils Relative: 3 %
HCT: 32.7 % — ABNORMAL LOW (ref 36.0–46.0)
Hemoglobin: 10.3 g/dL — ABNORMAL LOW (ref 12.0–15.0)
Immature Granulocytes: 0 %
Lymphocytes Relative: 18 %
Lymphs Abs: 1.3 10*3/uL (ref 0.7–4.0)
MCH: 28.1 pg (ref 26.0–34.0)
MCHC: 31.5 g/dL (ref 30.0–36.0)
MCV: 89.1 fL (ref 80.0–100.0)
Monocytes Absolute: 0.5 10*3/uL (ref 0.1–1.0)
Monocytes Relative: 7 %
Neutro Abs: 5.2 10*3/uL (ref 1.7–7.7)
Neutrophils Relative %: 71 %
Platelets: 51 10*3/uL — ABNORMAL LOW (ref 150–400)
RBC: 3.67 MIL/uL — ABNORMAL LOW (ref 3.87–5.11)
RDW: 13.2 % (ref 11.5–15.5)
WBC: 7.2 10*3/uL (ref 4.0–10.5)
nRBC: 0 % (ref 0.0–0.2)

## 2020-02-28 ENCOUNTER — Ambulatory Visit: Payer: Medicare Other | Attending: Internal Medicine

## 2020-02-28 DIAGNOSIS — Z23 Encounter for immunization: Secondary | ICD-10-CM

## 2020-02-28 NOTE — Progress Notes (Signed)
   Covid-19 Vaccination Clinic  Name:  Alison Lopez    MRN: [id number] DOB: 1956-10-01  02/28/2020  Alison Lopez was observed post Covid-19 immunization for 15 minutes without incident. She was provided with Vaccine Information Sheet and instruction to access the V-Safe system.   Alison Lopez was instructed to call 911 with any severe reactions post vaccine: Marland Kitchen Difficulty breathing  . Swelling of face and throat  . A fast heartbeat  . A bad rash all over body  . Dizziness and weakness   Immunizations Administered    Name Date Dose VIS Date Route   Pfizer COVID-19 Vaccine 02/28/2020  9:20 AM 0.3 mL 01/04/2019 Intramuscular   Manufacturer: [employer]   Lot: [id number]   [place]: [id number]

## 2020-04-04 ENCOUNTER — Inpatient Hospital Stay: Payer: Medicare Other | Attending: Oncology

## 2020-04-04 ENCOUNTER — Other Ambulatory Visit: Payer: Self-pay

## 2020-04-04 DIAGNOSIS — Z7901 Long term (current) use of anticoagulants: Secondary | ICD-10-CM | POA: Diagnosis not present

## 2020-04-04 DIAGNOSIS — R5383 Other fatigue: Secondary | ICD-10-CM | POA: Insufficient documentation

## 2020-04-04 DIAGNOSIS — M797 Fibromyalgia: Secondary | ICD-10-CM | POA: Diagnosis not present

## 2020-04-04 DIAGNOSIS — R519 Headache, unspecified: Secondary | ICD-10-CM | POA: Insufficient documentation

## 2020-04-04 DIAGNOSIS — E119 Type 2 diabetes mellitus without complications: Secondary | ICD-10-CM | POA: Diagnosis not present

## 2020-04-04 DIAGNOSIS — I10 Essential (primary) hypertension: Secondary | ICD-10-CM | POA: Diagnosis not present

## 2020-04-04 DIAGNOSIS — M329 Systemic lupus erythematosus, unspecified: Secondary | ICD-10-CM | POA: Insufficient documentation

## 2020-04-04 DIAGNOSIS — M1990 Unspecified osteoarthritis, unspecified site: Secondary | ICD-10-CM | POA: Diagnosis not present

## 2020-04-04 DIAGNOSIS — Z79899 Other long term (current) drug therapy: Secondary | ICD-10-CM | POA: Diagnosis not present

## 2020-04-04 DIAGNOSIS — R634 Abnormal weight loss: Secondary | ICD-10-CM | POA: Diagnosis not present

## 2020-04-04 DIAGNOSIS — J449 Chronic obstructive pulmonary disease, unspecified: Secondary | ICD-10-CM | POA: Insufficient documentation

## 2020-04-04 DIAGNOSIS — D696 Thrombocytopenia, unspecified: Secondary | ICD-10-CM | POA: Diagnosis present

## 2020-04-04 DIAGNOSIS — Z7952 Long term (current) use of systemic steroids: Secondary | ICD-10-CM | POA: Diagnosis not present

## 2020-04-04 DIAGNOSIS — M810 Age-related osteoporosis without current pathological fracture: Secondary | ICD-10-CM | POA: Diagnosis not present

## 2020-04-04 DIAGNOSIS — Z7984 Long term (current) use of oral hypoglycemic drugs: Secondary | ICD-10-CM | POA: Diagnosis not present

## 2020-04-04 DIAGNOSIS — R110 Nausea: Secondary | ICD-10-CM | POA: Diagnosis not present

## 2020-04-04 DIAGNOSIS — D6861 Antiphospholipid syndrome: Secondary | ICD-10-CM | POA: Insufficient documentation

## 2020-04-04 DIAGNOSIS — R5381 Other malaise: Secondary | ICD-10-CM | POA: Insufficient documentation

## 2020-04-04 DIAGNOSIS — D509 Iron deficiency anemia, unspecified: Secondary | ICD-10-CM | POA: Diagnosis not present

## 2020-04-04 DIAGNOSIS — F329 Major depressive disorder, single episode, unspecified: Secondary | ICD-10-CM | POA: Diagnosis not present

## 2020-04-04 DIAGNOSIS — R918 Other nonspecific abnormal finding of lung field: Secondary | ICD-10-CM | POA: Diagnosis not present

## 2020-04-04 DIAGNOSIS — Z86718 Personal history of other venous thrombosis and embolism: Secondary | ICD-10-CM | POA: Diagnosis not present

## 2020-04-04 DIAGNOSIS — Z87891 Personal history of nicotine dependence: Secondary | ICD-10-CM | POA: Insufficient documentation

## 2020-04-04 LAB — CBC
HCT: 30.2 % — ABNORMAL LOW (ref 36.0–46.0)
Hemoglobin: 9.5 g/dL — ABNORMAL LOW (ref 12.0–15.0)
MCH: 26.6 pg (ref 26.0–34.0)
MCHC: 31.5 g/dL (ref 30.0–36.0)
MCV: 84.6 fL (ref 80.0–100.0)
Platelets: 62 10*3/uL — ABNORMAL LOW (ref 150–400)
RBC: 3.57 MIL/uL — ABNORMAL LOW (ref 3.87–5.11)
RDW: 13.4 % (ref 11.5–15.5)
WBC: 5.7 10*3/uL (ref 4.0–10.5)
nRBC: 0 % (ref 0.0–0.2)

## 2020-04-04 LAB — COMPREHENSIVE METABOLIC PANEL
ALT: 10 U/L (ref 0–44)
AST: 14 U/L — ABNORMAL LOW (ref 15–41)
Albumin: 3.7 g/dL (ref 3.5–5.0)
Alkaline Phosphatase: 58 U/L (ref 38–126)
Anion gap: 6 (ref 5–15)
BUN: 23 mg/dL (ref 8–23)
CO2: 27 mmol/L (ref 22–32)
Calcium: 8.8 mg/dL — ABNORMAL LOW (ref 8.9–10.3)
Chloride: 107 mmol/L (ref 98–111)
Creatinine, Ser: 1 mg/dL (ref 0.44–1.00)
GFR calc Af Amer: >60 mL/min (ref >60)
GFR calc non Af Amer: 59 mL/min — ABNORMAL LOW (ref >60)
Glucose, Bld: 85 mg/dL (ref 70–99)
Potassium: 3.8 mmol/L (ref 3.5–5.1)
Sodium: 140 mmol/L (ref 135–145)
Total Bilirubin: 0.4 mg/dL (ref 0.3–1.2)
Total Protein: 6.7 g/dL (ref 6.5–8.1)

## 2020-04-05 ENCOUNTER — Inpatient Hospital Stay (HOSPITAL_BASED_OUTPATIENT_CLINIC_OR_DEPARTMENT_OTHER): Payer: Medicare Other | Admitting: Oncology

## 2020-04-05 ENCOUNTER — Encounter: Payer: Self-pay | Admitting: Oncology

## 2020-04-05 DIAGNOSIS — D6861 Antiphospholipid syndrome: Secondary | ICD-10-CM

## 2020-04-05 DIAGNOSIS — R634 Abnormal weight loss: Secondary | ICD-10-CM | POA: Diagnosis not present

## 2020-04-05 DIAGNOSIS — D696 Thrombocytopenia, unspecified: Secondary | ICD-10-CM | POA: Diagnosis not present

## 2020-04-05 DIAGNOSIS — R918 Other nonspecific abnormal finding of lung field: Secondary | ICD-10-CM | POA: Diagnosis not present

## 2020-04-05 NOTE — Progress Notes (Signed)
Patient scheduled for virtual visit today, call placed to review chart. Patient denies any concerns today.

## 2020-04-09 NOTE — Progress Notes (Signed)
I connected with Alison Lopez on 04/09/20 at  2:00 PM EDT by video enabled telemedicine visit and verified that I am speaking with the correct person using two identifiers.   I discussed the limitations, risks, security and privacy concerns of performing an evaluation and management service by telemedicine and the availability of in-person appointments. I also discussed with the patient that there may be a patient responsible charge related to this service. The patient expressed understanding and agreed to proceed.  There were problems with connection and had to be switched to telephone call.   Other persons participating in the visit and their role in the encounter:  none  Patient's location:  home Provider's location:  work  Risk analyst Complaint:  Routine f/u of APS and thombocytopenia  History of present illness: Patient is a 64 yr old female with prior h/o LLEPROXIMALDVT back in 1993 after an episode of pneumonia. Patient was started on coumadin back then and stayed on it. On 04/20/19 patient noticed pain and tightness in her RLE. She came to the ER and USG showed extensive right calf, femoral and popliteal DVT. INR was therapeutic at 2.2 on this day. Patient was switched to eliquis. Also seen by vascular surgery and underwent thrombectomy/ PTA and IVC filter placement. Patient reports intermittent headaches and nausea since she started taking eliquis. RLE swelling is getting better. She denies any DVT/PE in the 27 years she was on coumadin no pregnancy losses. No family h/o thrombosis. She has 2 adult children with no h/o DVT  At the time of DVT patient also noted to have iron deficiency anemia and thrombocytopenia. Platelet counts have been normal in 2018. Recent H/H was 7.7/25.3 and platelet count was 72. She did receive 1 dose of IV iron and currently she is taking oral iron. She denies any blood in stools. Stools have beendark since she started taking po iron  Interval history no new  episodes of thrombosis. She cannot see with her right eye   Review of Systems  Constitutional: Positive for malaise/fatigue and weight loss. Negative for chills and fever.  HENT: Negative for congestion, ear discharge and nosebleeds.   Eyes: Negative for blurred vision.  Respiratory: Negative for cough, hemoptysis, sputum production, shortness of breath and wheezing.   Cardiovascular: Negative for chest pain, palpitations, orthopnea and claudication.  Gastrointestinal: Negative for abdominal pain, blood in stool, constipation, diarrhea, heartburn, melena, nausea and vomiting.  Genitourinary: Negative for dysuria, flank pain, frequency, hematuria and urgency.  Musculoskeletal: Negative for back pain, joint pain and myalgias.  Skin: Negative for rash.  Neurological: Negative for dizziness, tingling, focal weakness, seizures, weakness and headaches.  Endo/Heme/Allergies: Does not bruise/bleed easily.  Psychiatric/Behavioral: Negative for depression and suicidal ideas. The patient does not have insomnia.     Allergies  Allergen Reactions  . Morphine And Related Anaphylaxis  . Aspirin Other (See Comments)    Patient on blood thinners and was told not to take  . Benadryl [Diphenhydramine Hcl] Hives  . Codeine Other (See Comments)    AMS    Past Medical History:  Diagnosis Date  . Arthritis   . Collagen vascular disease ([place])   . COPD (chronic obstructive pulmonary disease) ([place])   . Depression   . Diabetes mellitus without complication ([place])   . DVT (deep venous thrombosis) ([place])   . Fibromyalgia   . Hypertension   . Lupus ([place])   . Osteoporosis     Past Surgical History:  Procedure Laterality Date  . ABDOMINAL HYSTERECTOMY    .  IVC FILTER REMOVAL N/A 07/21/2019   Procedure: IVC FILTER REMOVAL;  Surgeon: Algernon Huxley, MD;  Location: [place] [facility];  Service: Cardiovascular;  Laterality: N/A;  . LOWER EXTREMITY INTERVENTION Right 04/22/2019   Procedure: IVC Filter  Insertion with Right Lower Extremity Venous Lysis;  Surgeon: Algernon Huxley, MD;  Location: [place] [facility];  Service: Cardiovascular;  Laterality: Right;  . OOPHORECTOMY      Social History   Socioeconomic History  . Marital status: Divorced    Spouse name: Not on file  . Number of children: Not on file  . Years of education: Not on file  . Highest education level: Not on file  Occupational History  . Not on file  Tobacco Use  . Smoking status: Former Smoker    Packs/day: 1.00    Years: 17.00    Pack years: 17.00    Types: Cigarettes    Quit date: 2002    Years since quitting: 19.4  . Smokeless tobacco: Never Used  Substance and Sexual Activity  . Alcohol use: Not Currently  . Drug use: Never  . Sexual activity: Not on file  Other Topics Concern  . Not on file  Social History Narrative  . Not on file   Social Determinants of Health   Financial Resource Strain:   . Difficulty of Paying Living Expenses:   Food Insecurity:   . Worried About Charity fundraiser in the Last Year:   . Arboriculturist in the Last Year:   Transportation Needs:   . Film/video editor (Medical):   Marland Kitchen Lack of Transportation (Non-Medical):   Physical Activity:   . Days of Exercise per Week:   . Minutes of Exercise per Session:   Stress:   . Feeling of Stress :   Social Connections:   . Frequency of Communication with Friends and Family:   . Frequency of Social Gatherings with Friends and Family:   . Attends Religious Services:   . Active Member of Clubs or Organizations:   . Attends Archivist Meetings:   Marland Kitchen Marital Status:   Intimate Partner Violence:   . Fear of Current or Ex-Partner:   . Emotionally Abused:   Marland Kitchen Physically Abused:   . Sexually Abused:     Family History  Problem Relation Age of Onset  . Stomach cancer Mother   . Heart attack Father   . Breast cancer Neg Hx      Current Outpatient Medications:  .  acetaminophen (TYLENOL) 650 MG CR tablet,  Take 650 mg by mouth every 8 (eight) hours as needed for pain., Disp: , Rfl:  .  albuterol (PROVENTIL) (2.5 MG/3ML) 0.083% nebulizer solution, Take 2.5 mg by nebulization every 4 (four) hours as needed for wheezing or shortness of breath. , Disp: , Rfl:  .  albuterol (VENTOLIN HFA) 108 (90 Base) MCG/ACT inhaler, Inhale 2 puffs into the lungs every 4 (four) hours as needed for wheezing or shortness of breath., Disp: , Rfl:  .  apixaban (ELIQUIS) 5 MG TABS tablet, Take 1 tablet (5 mg total) by mouth 2 (two) times daily., Disp: 60 tablet, Rfl: 2 .  ascorbic acid (VITAMIN C) 100 MG tablet, Take 1,000 mg by mouth daily., Disp: , Rfl:  .  atropine 1 % ophthalmic solution, Place 1 drop into the right eye in the morning and at bedtime., Disp: , Rfl:  .  brimonidine-timolol (COMBIGAN) 0.2-0.5 % ophthalmic solution, Place 3 drops into  both eyes every 12 (twelve) hours., Disp: , Rfl:  .  Brinzolamide-Brimonidine 1-0.2 % SUSP, Apply 2.5 mLs to eye 1 day or 1 dose., Disp: , Rfl:  .  buPROPion (WELLBUTRIN SR) 150 MG 12 hr tablet, Take 150 mg by mouth daily., Disp: , Rfl:  .  cyclobenzaprine (FLEXERIL) 5 MG tablet, Take 5 mg by mouth 3 (three) times daily as needed for muscle spasms., Disp: , Rfl:  .  DULoxetine (CYMBALTA) 20 MG capsule, Take 1 capsule by mouth 1 day or 1 dose., Disp: , Rfl:  .  ergocalciferol (VITAMIN D2) 1.25 MG (50000 UT) capsule, Take 50,000 Units by mouth every Friday. , Disp: , Rfl:  .  erythromycin ophthalmic ointment, Place 1 application into both eyes at bedtime., Disp: , Rfl:  .  esomeprazole (NEXIUM) 40 MG capsule, Take by mouth., Disp: , Rfl:  .  furosemide (LASIX) 20 MG tablet, Take 1 tablet by mouth daily as needed., Disp: , Rfl:  .  levothyroxine (SYNTHROID, LEVOTHROID) 88 MCG tablet, Take 75 mcg by mouth daily before breakfast. , Disp: , Rfl:  .  losartan (COZAAR) 50 MG tablet, Take 50 mg by mouth daily. prn, Disp: , Rfl:  .  metFORMIN (GLUCOPHAGE) 500 MG tablet, Take 500 mg by  mouth daily. , Disp: , Rfl:  .  mometasone (NASONEX) 50 MCG/ACT nasal spray, Place 2 sprays into the nose daily., Disp: , Rfl:  .  neomycin-bacitracin-polymyxin (NEOSPORIN) ophthalmic ointment, Place 1 application into both eyes at bedtime., Disp: , Rfl:  .  prednisoLONE acetate (PRED FORTE) 1 % ophthalmic suspension, Place 1 drop into both eyes 1 day or 1 dose., Disp: , Rfl:  .  predniSONE (DELTASONE) 10 MG tablet, Take 5 mg by mouth daily with breakfast. , Disp: , Rfl:  .  simvastatin (ZOCOR) 40 MG tablet, Take 40 mg by mouth daily., Disp: , Rfl:  .  SYMBICORT 160-4.5 MCG/ACT inhaler, , Disp: , Rfl:  .  ferrous sulfate 325 (65 FE) MG tablet, Take by mouth., Disp: , Rfl:   No results found.  No images are attached to the encounter.   CMP Latest Ref Rng & Units 04/04/2020  Glucose 70 - 99 mg/dL 85  BUN 8 - 23 mg/dL 23  Creatinine 0.44 - 1.00 mg/dL 1.00  Sodium 135 - 145 mmol/L 140  Potassium 3.5 - 5.1 mmol/L 3.8  Chloride 98 - 111 mmol/L 107  CO2 22 - 32 mmol/L 27  Calcium 8.9 - 10.3 mg/dL 8.8(L)  Total Protein 6.5 - 8.1 g/dL 6.7  Total Bilirubin 0.3 - 1.2 mg/dL 0.4  Alkaline Phos 38 - 126 U/L 58  AST 15 - 41 U/L 14(L)  ALT 0 - 44 U/L 10   CBC Latest Ref Rng & Units 04/04/2020  WBC 4.0 - 10.5 K/uL 5.7  Hemoglobin 12.0 - 15.0 g/dL 9.5(L)  Hematocrit 36.0 - 46.0 % 30.2(L)  Platelets 150 - 400 K/uL 62(L)    Assessment and plan: Patient is a 64 yr old female with following issues:  1. Recurrent DVT: no new episodes of DVT. She is currently on eliquis as she failed coumadin  2. Thrombocytopenia- over the last 1 year. Platelet counts stable in the 50's continue to monitor  3. Abnormal weight loss- scans in oct 2020 did not show nay malignancy. However she continues to lose weight. We will get in touch with [facility] to see if they can scope her on eliquis to see if there is any malignancy that  can explain her weight loss. They can possible not take biopsies on eliquis but I would prefer  not to hold anticogulation given her high risk of thrombosis from antiphospholipid antibody syndrome. Will plan to repeat ct scans as well  Follow-up instructions:  I discussed the assessment and treatment plan with the patient. The patient was provided an opportunity to ask questions and all were answered. The patient agreed with the plan and demonstrated an understanding of the instructions.   The patient was advised to call back or seek an in-person evaluation if the symptoms worsen or if the condition fails to improve as anticipated.   Visit Diagnosis: 1. Thrombocytopenia ([place])   2. Abnormal weight loss   3. Lung nodules   4. Antiphospholipid antibody syndrome (HCC)     Dr. Randa Evens, MD, MPH [facility] at [facility] Tel- [phone] 04/09/2020 1:30 PM

## 2020-04-12 ENCOUNTER — Telehealth: Payer: Self-pay | Admitting: *Deleted

## 2020-04-12 NOTE — Telephone Encounter (Signed)
I called to see with GI if they rcvd our fax about doing a scope and keeping pt on the blood thinner due to her high risk of clot from her antiphospholipid atb.  I spoke to cindy and they did get the fax with md notes attached and she already had appt already scheduled for 8/19 prior to me sending the fax. I spoke to Janese Banks  And she says that is fine for the gi appt 8/19

## 2020-04-20 ENCOUNTER — Other Ambulatory Visit: Payer: Self-pay

## 2020-04-20 ENCOUNTER — Ambulatory Visit
Admission: RE | Admit: 2020-04-20 | Discharge: 2020-04-20 | Disposition: A | Discharge status: Home / Self Care | Payer: Medicare Other | Source: Ambulatory Visit | Attending: Oncology | Admitting: Oncology

## 2020-04-20 DIAGNOSIS — R634 Abnormal weight loss: Secondary | ICD-10-CM

## 2020-04-20 DIAGNOSIS — R918 Other nonspecific abnormal finding of lung field: Secondary | ICD-10-CM | POA: Insufficient documentation

## 2020-04-20 DIAGNOSIS — D696 Thrombocytopenia, unspecified: Secondary | ICD-10-CM | POA: Diagnosis not present

## 2020-04-20 MED ORDER — IOHEXOL 300 MG/ML  SOLN
75.0000 mL | Freq: Once | INTRAMUSCULAR | Status: AC | PRN
  Administered 2020-04-20: 75 mL via INTRAVENOUS

## 2020-04-23 ENCOUNTER — Telehealth: Payer: Self-pay | Admitting: *Deleted

## 2020-04-23 NOTE — Telephone Encounter (Signed)
Called report  IMPRESSION: 1. Interval increase in number of bilateral patchy areas of ground-glass attenuation. While these may conceivably be secondary to interstitial pulmonary process, overall this demonstrates an atypical pattern. Given patient's history of weight loss, and the progressive nature of these findings, possibility of multifocal pulmonary adenocarcinoma should also be considered. Recommend outpatient pulmonology referral. 2. No acute process within the abdomen. 3. These results will be called to the ordering clinician or representative by the Radiologist Assistant, and communication documented in the PACS or [employer].   Electronically Signed   By: Lovey Newcomer M.D.   On: 04/20/2020 16:54

## 2020-05-03 ENCOUNTER — Inpatient Hospital Stay: Payer: Medicare Other | Attending: Oncology

## 2020-05-03 ENCOUNTER — Encounter: Payer: Self-pay | Admitting: Oncology

## 2020-05-03 ENCOUNTER — Inpatient Hospital Stay (HOSPITAL_BASED_OUTPATIENT_CLINIC_OR_DEPARTMENT_OTHER): Payer: Medicare Other | Admitting: Oncology

## 2020-05-03 ENCOUNTER — Other Ambulatory Visit: Payer: Self-pay

## 2020-05-03 VITALS — BP 141/74 | HR 68 | Temp 97.4°F | Resp 20 | Wt 116.0 lb

## 2020-05-03 DIAGNOSIS — Z7984 Long term (current) use of oral hypoglycemic drugs: Secondary | ICD-10-CM | POA: Diagnosis not present

## 2020-05-03 DIAGNOSIS — Z86718 Personal history of other venous thrombosis and embolism: Secondary | ICD-10-CM | POA: Diagnosis not present

## 2020-05-03 DIAGNOSIS — Z87891 Personal history of nicotine dependence: Secondary | ICD-10-CM | POA: Insufficient documentation

## 2020-05-03 DIAGNOSIS — Z7901 Long term (current) use of anticoagulants: Secondary | ICD-10-CM | POA: Diagnosis not present

## 2020-05-03 DIAGNOSIS — D6861 Antiphospholipid syndrome: Secondary | ICD-10-CM

## 2020-05-03 DIAGNOSIS — Z7951 Long term (current) use of inhaled steroids: Secondary | ICD-10-CM | POA: Diagnosis not present

## 2020-05-03 DIAGNOSIS — D509 Iron deficiency anemia, unspecified: Secondary | ICD-10-CM | POA: Insufficient documentation

## 2020-05-03 DIAGNOSIS — Z90710 Acquired absence of both cervix and uterus: Secondary | ICD-10-CM | POA: Insufficient documentation

## 2020-05-03 DIAGNOSIS — F329 Major depressive disorder, single episode, unspecified: Secondary | ICD-10-CM | POA: Insufficient documentation

## 2020-05-03 DIAGNOSIS — D696 Thrombocytopenia, unspecified: Secondary | ICD-10-CM | POA: Diagnosis not present

## 2020-05-03 DIAGNOSIS — Z7952 Long term (current) use of systemic steroids: Secondary | ICD-10-CM | POA: Insufficient documentation

## 2020-05-03 DIAGNOSIS — R634 Abnormal weight loss: Secondary | ICD-10-CM | POA: Diagnosis not present

## 2020-05-03 DIAGNOSIS — Z90721 Acquired absence of ovaries, unilateral: Secondary | ICD-10-CM | POA: Insufficient documentation

## 2020-05-03 DIAGNOSIS — E119 Type 2 diabetes mellitus without complications: Secondary | ICD-10-CM | POA: Insufficient documentation

## 2020-05-03 DIAGNOSIS — Z79899 Other long term (current) drug therapy: Secondary | ICD-10-CM | POA: Insufficient documentation

## 2020-05-03 DIAGNOSIS — I10 Essential (primary) hypertension: Secondary | ICD-10-CM | POA: Insufficient documentation

## 2020-05-03 DIAGNOSIS — R918 Other nonspecific abnormal finding of lung field: Secondary | ICD-10-CM | POA: Diagnosis not present

## 2020-05-03 DIAGNOSIS — M329 Systemic lupus erythematosus, unspecified: Secondary | ICD-10-CM | POA: Insufficient documentation

## 2020-05-03 DIAGNOSIS — R9389 Abnormal findings on diagnostic imaging of other specified body structures: Secondary | ICD-10-CM

## 2020-05-03 DIAGNOSIS — J449 Chronic obstructive pulmonary disease, unspecified: Secondary | ICD-10-CM | POA: Diagnosis not present

## 2020-05-03 LAB — CBC WITH DIFFERENTIAL/PLATELET
Abs Immature Granulocytes: 0.01 10*3/uL (ref 0.00–0.07)
Basophils Absolute: 0.1 10*3/uL (ref 0.0–0.1)
Basophils Relative: 1 %
Eosinophils Absolute: 0.3 10*3/uL (ref 0.0–0.5)
Eosinophils Relative: 4 %
HCT: 29.7 % — ABNORMAL LOW (ref 36.0–46.0)
Hemoglobin: 9.3 g/dL — ABNORMAL LOW (ref 12.0–15.0)
Immature Granulocytes: 0 %
Lymphocytes Relative: 24 %
Lymphs Abs: 1.5 10*3/uL (ref 0.7–4.0)
MCH: 26 pg (ref 26.0–34.0)
MCHC: 31.3 g/dL (ref 30.0–36.0)
MCV: 83 fL (ref 80.0–100.0)
Monocytes Absolute: 0.5 10*3/uL (ref 0.1–1.0)
Monocytes Relative: 8 %
Neutro Abs: 3.9 10*3/uL (ref 1.7–7.7)
Neutrophils Relative %: 63 %
Platelets: 73 10*3/uL — ABNORMAL LOW (ref 150–400)
RBC: 3.58 MIL/uL — ABNORMAL LOW (ref 3.87–5.11)
RDW: 14.6 % (ref 11.5–15.5)
WBC: 6.3 10*3/uL (ref 4.0–10.5)
nRBC: 0 % (ref 0.0–0.2)

## 2020-05-03 LAB — COMPREHENSIVE METABOLIC PANEL
ALT: 10 U/L (ref 0–44)
AST: 14 U/L — ABNORMAL LOW (ref 15–41)
Albumin: 3.9 g/dL (ref 3.5–5.0)
Alkaline Phosphatase: 64 U/L (ref 38–126)
Anion gap: 12 (ref 5–15)
BUN: 22 mg/dL (ref 8–23)
CO2: 24 mmol/L (ref 22–32)
Calcium: 8.9 mg/dL (ref 8.9–10.3)
Chloride: 107 mmol/L (ref 98–111)
Creatinine, Ser: 1.01 mg/dL — ABNORMAL HIGH (ref 0.44–1.00)
GFR calc Af Amer: >60 mL/min (ref >60)
GFR calc non Af Amer: 58 mL/min — ABNORMAL LOW (ref >60)
Glucose, Bld: 110 mg/dL — ABNORMAL HIGH (ref 70–99)
Potassium: 3.7 mmol/L (ref 3.5–5.1)
Sodium: 143 mmol/L (ref 135–145)
Total Bilirubin: 0.5 mg/dL (ref 0.3–1.2)
Total Protein: 7.1 g/dL (ref 6.5–8.1)

## 2020-05-03 LAB — FIBRIN DERIVATIVES D-DIMER (ARMC ONLY): Fibrin derivatives D-dimer (ARMC): 592.48 ng/mL (FEU) — ABNORMAL HIGH (ref 0.00–499.00)

## 2020-05-06 NOTE — Progress Notes (Signed)
Hematology/Oncology Consult note [facility]  [phone] [phone]  Patient Care Team: Sharyne Peach, MD as PCP - General (Family Medicine)   Name of the patient: Alison Lopez  [id number]  02-Mar-1956   Date of visit: 05/06/20  Diagnosis- 1.  Antiphospholipid antibody syndrome and associated thrombocytopenia 2.  Abnormal weight loss  Chief complaint/ Reason for visit-discuss CT scan results and further management  Heme/Onc history: Patient is a 64 yr old female with prior h/o LLEPROXIMALDVT back in 1993 after an episode of pneumonia. Patient was started on coumadin back then and stayed on it. On 04/20/19 patient noticed pain and tightness in her RLE. She came to the ER and USG showed extensive right calf, femoral and popliteal DVT. INR was therapeutic at 2.2 on this day. Patient was switched to eliquis. Also seen by vascular surgery and underwent thrombectomy/ PTA and IVC filter placement. Patient reports intermittent headaches and nausea since she started taking eliquis. RLE swelling is getting better. She denies any DVT/PE in the 27 years she was on coumadin no pregnancy losses. No family h/o thrombosis. She has 2 adult children with no h/o DVT  At the time of DVT patient also noted to have iron deficiency anemia and thrombocytopenia. Platelet counts have been normal in 2018. Recent H/H was 7.7/25.3 and platelet count was 72. She did receive 1 dose of IV iron and currently she is taking oral iron. She denies any blood in stools. Stools have beendark since she started taking po iron  Interval history-she cannot see from her right eye.  Reports that her appetite is good and she is trying toHe continues to lose weight and is down to 116 pounds as compared to 135 pounds in October.  Denies any significant shortness of breath  ECOG PS- 2 Pain scale- 0   Review of systems- Review of Systems  Constitutional: Positive for malaise/fatigue  and weight loss. Negative for chills and fever.  HENT: Negative for congestion, ear discharge and nosebleeds.   Eyes: Negative for blurred vision.  Respiratory: Negative for cough, hemoptysis, sputum production, shortness of breath and wheezing.   Cardiovascular: Negative for chest pain, palpitations, orthopnea and claudication.  Gastrointestinal: Negative for abdominal pain, blood in stool, constipation, diarrhea, heartburn, melena, nausea and vomiting.  Genitourinary: Negative for dysuria, flank pain, frequency, hematuria and urgency.  Musculoskeletal: Negative for back pain, joint pain and myalgias.  Skin: Negative for rash.  Neurological: Negative for dizziness, tingling, focal weakness, seizures, weakness and headaches.  Endo/Heme/Allergies: Does not bruise/bleed easily.  Psychiatric/Behavioral: Negative for depression and suicidal ideas. The patient does not have insomnia.       Allergies  Allergen Reactions  . Morphine And Related Anaphylaxis  . Aspirin Other (See Comments)    Patient on blood thinners and was told not to take  . Benadryl [Diphenhydramine Hcl] Hives  . Codeine Other (See Comments)    AMS     Past Medical History:  Diagnosis Date  . Arthritis   . Collagen vascular disease ([place])   . COPD (chronic obstructive pulmonary disease) ([place])   . Depression   . Diabetes mellitus without complication ([place])   . DVT (deep venous thrombosis) ([place])   . Fibromyalgia   . Hypertension   . Lupus ([place])   . Osteoporosis      Past Surgical History:  Procedure Laterality Date  . ABDOMINAL HYSTERECTOMY    . IVC FILTER REMOVAL N/A 07/21/2019   Procedure: IVC FILTER REMOVAL;  Surgeon: Algernon Huxley, MD;  Location: [facility];  Service: Cardiovascular;  Laterality: N/A;  . LOWER EXTREMITY INTERVENTION Right 04/22/2019   Procedure: IVC Filter Insertion with Right Lower Extremity Venous Lysis;  Surgeon: Algernon Huxley, MD;  Location: [facility];  Service:  Cardiovascular;  Laterality: Right;  . OOPHORECTOMY      Social History   Socioeconomic History  . Marital status: Divorced    Spouse name: Not on file  . Number of children: Not on file  . Years of education: Not on file  . Highest education level: Not on file  Occupational History  . Not on file  Tobacco Use  . Smoking status: Former Smoker    Packs/day: 1.00    Years: 17.00    Pack years: 17.00    Types: Cigarettes    Quit date: 2002    Years since quitting: 19.4  . Smokeless tobacco: Never Used  Vaping Use  . Vaping Use: Never used  Substance and Sexual Activity  . Alcohol use: Not Currently  . Drug use: Never  . Sexual activity: Not on file  Other Topics Concern  . Not on file  Social History Narrative  . Not on file   Social Determinants of Health   Financial Resource Strain:   . Difficulty of Paying Living Expenses:   Food Insecurity:   . Worried About Charity fundraiser in the Last Year:   . Arboriculturist in the Last Year:   Transportation Needs:   . Film/video editor (Medical):   Marland Kitchen Lack of Transportation (Non-Medical):   Physical Activity:   . Days of Exercise per Week:   . Minutes of Exercise per Session:   Stress:   . Feeling of Stress :   Social Connections:   . Frequency of Communication with Friends and Family:   . Frequency of Social Gatherings with Friends and Family:   . Attends Religious Services:   . Active Member of Clubs or Organizations:   . Attends Archivist Meetings:   Marland Kitchen Marital Status:   Intimate Partner Violence:   . Fear of Current or Ex-Partner:   . Emotionally Abused:   Marland Kitchen Physically Abused:   . Sexually Abused:     Family History  Problem Relation Age of Onset  . Stomach cancer Mother   . Heart attack Father   . Breast cancer Neg Hx      Current Outpatient Medications:  .  acetaminophen (TYLENOL) 650 MG CR tablet, Take 650 mg by mouth every 8 (eight) hours as needed for pain., Disp: , Rfl:  .   albuterol (PROVENTIL) (2.5 MG/3ML) 0.083% nebulizer solution, Take 2.5 mg by nebulization every 4 (four) hours as needed for wheezing or shortness of breath. , Disp: , Rfl:  .  albuterol (VENTOLIN HFA) 108 (90 Base) MCG/ACT inhaler, Inhale 2 puffs into the lungs every 4 (four) hours as needed for wheezing or shortness of breath., Disp: , Rfl:  .  apixaban (ELIQUIS) 5 MG TABS tablet, Take 1 tablet (5 mg total) by mouth 2 (two) times daily., Disp: 60 tablet, Rfl: 2 .  ascorbic acid (VITAMIN C) 100 MG tablet, Take 1,000 mg by mouth daily., Disp: , Rfl:  .  atropine 1 % ophthalmic solution, Place 1 drop into the right eye in the morning and at bedtime., Disp: , Rfl:  .  brimonidine-timolol (COMBIGAN) 0.2-0.5 % ophthalmic solution, Place 3 drops into both eyes every  12 (twelve) hours., Disp: , Rfl:  .  Brinzolamide-Brimonidine 1-0.2 % SUSP, Apply 2.5 mLs to eye 1 day or 1 dose., Disp: , Rfl:  .  buPROPion (WELLBUTRIN SR) 150 MG 12 hr tablet, Take 150 mg by mouth daily., Disp: , Rfl:  .  cyclobenzaprine (FLEXERIL) 5 MG tablet, Take 5 mg by mouth 3 (three) times daily as needed for muscle spasms., Disp: , Rfl:  .  DULoxetine (CYMBALTA) 20 MG capsule, Take 1 capsule by mouth 1 day or 1 dose., Disp: , Rfl:  .  ergocalciferol (VITAMIN D2) 1.25 MG (50000 UT) capsule, Take 50,000 Units by mouth every Friday. , Disp: , Rfl:  .  erythromycin ophthalmic ointment, Place 1 application into both eyes at bedtime., Disp: , Rfl:  .  esomeprazole (NEXIUM) 40 MG capsule, Take by mouth., Disp: , Rfl:  .  ferrous sulfate 325 (65 FE) MG tablet, Take by mouth., Disp: , Rfl:  .  furosemide (LASIX) 20 MG tablet, Take 1 tablet by mouth daily as needed., Disp: , Rfl:  .  levothyroxine (SYNTHROID, LEVOTHROID) 88 MCG tablet, Take 75 mcg by mouth daily before breakfast. , Disp: , Rfl:  .  losartan (COZAAR) 50 MG tablet, Take 50 mg by mouth daily. prn, Disp: , Rfl:  .  metFORMIN (GLUCOPHAGE) 500 MG tablet, Take 500 mg by mouth daily.  , Disp: , Rfl:  .  mometasone (NASONEX) 50 MCG/ACT nasal spray, Place 2 sprays into the nose daily., Disp: , Rfl:  .  neomycin-bacitracin-polymyxin (NEOSPORIN) ophthalmic ointment, Place 1 application into both eyes at bedtime., Disp: , Rfl:  .  prednisoLONE acetate (PRED FORTE) 1 % ophthalmic suspension, Place 1 drop into both eyes 1 day or 1 dose., Disp: , Rfl:  .  predniSONE (DELTASONE) 10 MG tablet, Take 5 mg by mouth daily with breakfast. , Disp: , Rfl:  .  simvastatin (ZOCOR) 40 MG tablet, Take 40 mg by mouth daily., Disp: , Rfl:  .  SYMBICORT 160-4.5 MCG/ACT inhaler, , Disp: , Rfl:   Physical exam:  Vitals:   05/03/20 1407  BP: (!) 141/74  Pulse: 68  Resp: 20  Temp: (!) 97.4 F (36.3 C)  SpO2: 100%  Weight: 116 lb (52.6 kg)   Physical Exam Constitutional:      Comments: Patient is thin and frail.  Appears in no acute distress  Cardiovascular:     Rate and Rhythm: Normal rate and regular rhythm.     Heart sounds: Normal heart sounds.  Pulmonary:     Effort: Pulmonary effort is normal.     Breath sounds: Normal breath sounds.  Abdominal:     General: Bowel sounds are normal.     Palpations: Abdomen is soft.  Skin:    General: Skin is warm and dry.  Neurological:     Mental Status: She is alert and oriented to person, place, and time.      CMP Latest Ref Rng & Units 05/03/2020  Glucose 70 - 99 mg/dL 110(H)  BUN 8 - 23 mg/dL 22  Creatinine 0.44 - 1.00 mg/dL 1.01(H)  Sodium 135 - 145 mmol/L 143  Potassium 3.5 - 5.1 mmol/L 3.7  Chloride 98 - 111 mmol/L 107  CO2 22 - 32 mmol/L 24  Calcium 8.9 - 10.3 mg/dL 8.9  Total Protein 6.5 - 8.1 g/dL 7.1  Total Bilirubin 0.3 - 1.2 mg/dL 0.5  Alkaline Phos 38 - 126 U/L 64  AST 15 - 41 U/L 14(L)  ALT 0 -  44 U/L 10   CBC Latest Ref Rng & Units 05/03/2020  WBC 4.0 - 10.5 K/uL 6.3  Hemoglobin 12.0 - 15.0 g/dL 9.3(L)  Hematocrit 36 - 46 % 29.7(L)  Platelets 150 - 400 K/uL 73(L)    No images are attached to the encounter.  CT  Chest W Contrast  Result Date: 04/20/2020 CLINICAL DATA:  Patient with unintentional weight loss. Evaluate for malignancy. EXAM: CT CHEST, ABDOMEN, AND PELVIS WITH CONTRAST TECHNIQUE: Multidetector CT imaging of the chest, abdomen and pelvis was performed following the standard protocol during bolus administration of intravenous contrast. CONTRAST:  93mL OMNIPAQUE IOHEXOL 300 MG/ML  SOLN COMPARISON:  CT C AP 09/05/2019 FINDINGS: CT CHEST FINDINGS Cardiovascular: Heart is mildly enlarged. Thoracic aortic vascular calcifications. No pericardial effusion. Aorta and main pulmonary artery normal in caliber. Mediastinum/Nodes: No enlarged axillary, mediastinal or hilar lymphadenopathy. Normal appearance of the esophagus. Lungs/Pleura: Central airways are patent. Interval increase in bilateral patchy predominately ground-glass opacities. Reference new 15 mm ground-glass opacity right upper lobe (image 29; series 3). No pleural effusion or pneumothorax. Redemonstrated scarring within the lingula and left lower lobe. Musculoskeletal: Thoracic spine degenerative changes. No aggressive or acute appearing osseous lesions. CT ABDOMEN PELVIS FINDINGS Hepatobiliary: The liver is normal in size and contour. Fatty deposition adjacent to the falciform ligament. Multiple stones within the gallbladder lumen. No intrahepatic or extrahepatic biliary ductal dilatation. Pancreas: Unremarkable Spleen: Unremarkable Adrenals/Urinary Tract: Normal adrenal glands. Kidneys enhance symmetrically with contrast. No hydronephrosis. Urinary bladder is unremarkable. Stomach/Bowel: Sigmoid colonic diverticulosis. No CT evidence for acute diverticulitis. No evidence for bowel obstruction. No free fluid or free intraperitoneal air. Small hiatal hernia. Normal morphology of the stomach.2 Vascular/Lymphatic: Normal caliber abdominal aorta. Peripheral calcified atherosclerotic plaque. No retroperitoneal lymphadenopathy. Reproductive: Prior hysterectomy.   No pelvic masses. Other: None. Musculoskeletal: Lower thoracic and lumbar spine degenerative changes. No aggressive or acute appearing osseous lesions. IMPRESSION: 1. Interval increase in number of bilateral patchy areas of ground-glass attenuation. While these may conceivably be secondary to interstitial pulmonary process, overall this demonstrates an atypical pattern. Given patient's history of weight loss, and the progressive nature of these findings, possibility of multifocal pulmonary adenocarcinoma should also be considered. Recommend outpatient pulmonology referral. 2. No acute process within the abdomen. 3. These results will be called to the ordering clinician or representative by the Radiologist Assistant, and communication documented in the PACS or [employer]. Electronically Signed   By: Lovey Newcomer M.D.   On: 04/20/2020 16:54   CT ABDOMEN PELVIS W CONTRAST  Result Date: 04/20/2020 CLINICAL DATA:  Patient with unintentional weight loss. Evaluate for malignancy. EXAM: CT CHEST, ABDOMEN, AND PELVIS WITH CONTRAST TECHNIQUE: Multidetector CT imaging of the chest, abdomen and pelvis was performed following the standard protocol during bolus administration of intravenous contrast. CONTRAST:  6mL OMNIPAQUE IOHEXOL 300 MG/ML  SOLN COMPARISON:  CT C AP 09/05/2019 FINDINGS: CT CHEST FINDINGS Cardiovascular: Heart is mildly enlarged. Thoracic aortic vascular calcifications. No pericardial effusion. Aorta and main pulmonary artery normal in caliber. Mediastinum/Nodes: No enlarged axillary, mediastinal or hilar lymphadenopathy. Normal appearance of the esophagus. Lungs/Pleura: Central airways are patent. Interval increase in bilateral patchy predominately ground-glass opacities. Reference new 15 mm ground-glass opacity right upper lobe (image 29; series 3). No pleural effusion or pneumothorax. Redemonstrated scarring within the lingula and left lower lobe. Musculoskeletal: Thoracic spine degenerative  changes. No aggressive or acute appearing osseous lesions. CT ABDOMEN PELVIS FINDINGS Hepatobiliary: The liver is normal in size and contour. Fatty deposition adjacent to  the falciform ligament. Multiple stones within the gallbladder lumen. No intrahepatic or extrahepatic biliary ductal dilatation. Pancreas: Unremarkable Spleen: Unremarkable Adrenals/Urinary Tract: Normal adrenal glands. Kidneys enhance symmetrically with contrast. No hydronephrosis. Urinary bladder is unremarkable. Stomach/Bowel: Sigmoid colonic diverticulosis. No CT evidence for acute diverticulitis. No evidence for bowel obstruction. No free fluid or free intraperitoneal air. Small hiatal hernia. Normal morphology of the stomach.2 Vascular/Lymphatic: Normal caliber abdominal aorta. Peripheral calcified atherosclerotic plaque. No retroperitoneal lymphadenopathy. Reproductive: Prior hysterectomy.  No pelvic masses. Other: None. Musculoskeletal: Lower thoracic and lumbar spine degenerative changes. No aggressive or acute appearing osseous lesions. IMPRESSION: 1. Interval increase in number of bilateral patchy areas of ground-glass attenuation. While these may conceivably be secondary to interstitial pulmonary process, overall this demonstrates an atypical pattern. Given patient's history of weight loss, and the progressive nature of these findings, possibility of multifocal pulmonary adenocarcinoma should also be considered. Recommend outpatient pulmonology referral. 2. No acute process within the abdomen. 3. These results will be called to the ordering clinician or representative by the Radiologist Assistant, and communication documented in the PACS or [employer]. Electronically Signed   By: Lovey Newcomer M.D.   On: 04/20/2020 16:54     Assessment and plan- Patient is a 64 y.o. female who is here to discuss CT scan results and further management  CT abdomen did not show any evidence of malignancy.  I have reviewed CT chest abdomen pelvis  images independently and discussed findings with the patient.  She is noted to have bilateral groundglass opacities which have progressively worsened since October.  There is a concern that this may be a multifocal malignancy.  I will reach out to Dr. Raul Del was her pulmonologist and get his opinion as well.  If bronchoscopy is needed we will have to hold her anticoagulation and I would like to do that with minimal interruption of her anticoagulation.  It is unclear whether this is contributing to her ongoing weight loss.  With regards to antiphospholipid antibody syndrome: She has not had any recurrent DVT or PE after she was started on Eliquis.  She does have baseline thrombocytopenia with a platelet count fluctuates between 50s to 70s.  Continue to monitor   I will see her back in 3 months with labs   Visit Diagnosis 1. Abnormal CT of the chest   2. Abnormal weight loss   3. Thrombocytopenia ([place])   4. Antiphospholipid antibody syndrome ([place])      Dr. Randa Evens, MD, MPH [facility] [phone] 05/06/2020 7:06 AM

## 2020-05-17 IMAGING — US RIGHT LOWER EXTREMITY VENOUS ULTRASOUND
1 series · 14 of 24 positions shown · non-contrast
Comparison: None

CLINICAL DATA: Calf pain x1 day

EXAM:
RIGHT LOWER EXTREMITY VENOUS DOPPLER ULTRASOUND
TECHNIQUE: Gray-scale sonography with compression, as well as color and duplex
ultrasound, were performed to evaluate the deep venous system from
the level of the common femoral vein through the popliteal and
proximal calf veins.

[Series 1: right lower extremity venous ultrasound · 14 of 52 slices shown]
[im 1/52]
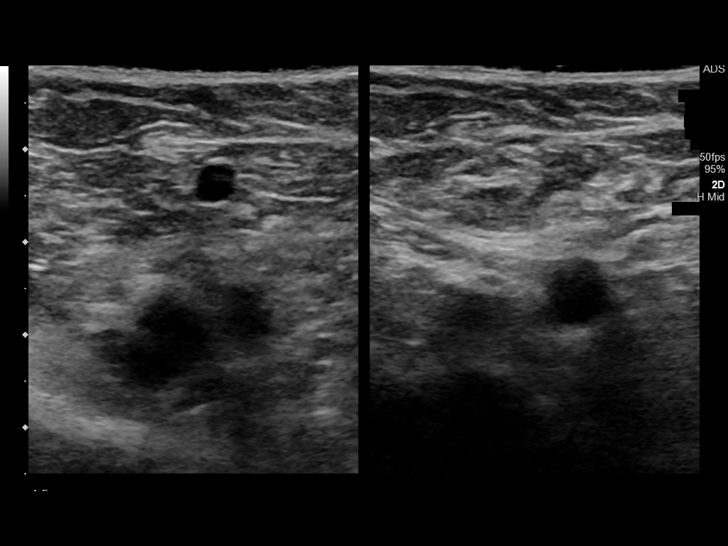
[im 5/52]
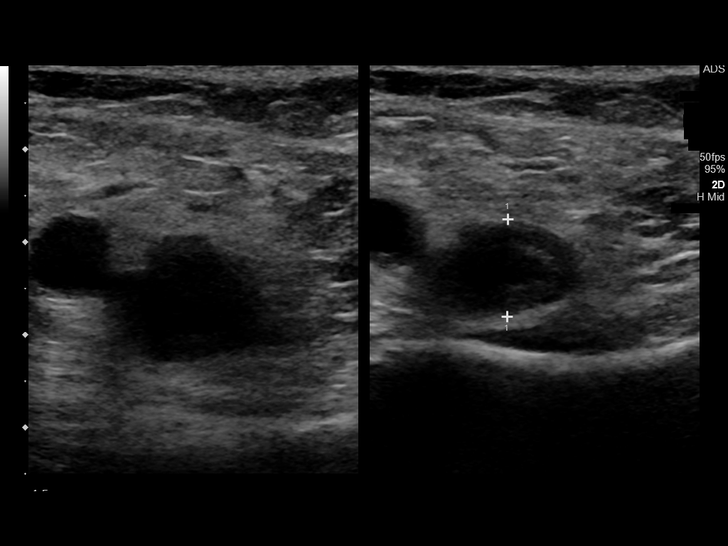
[im 9/52]
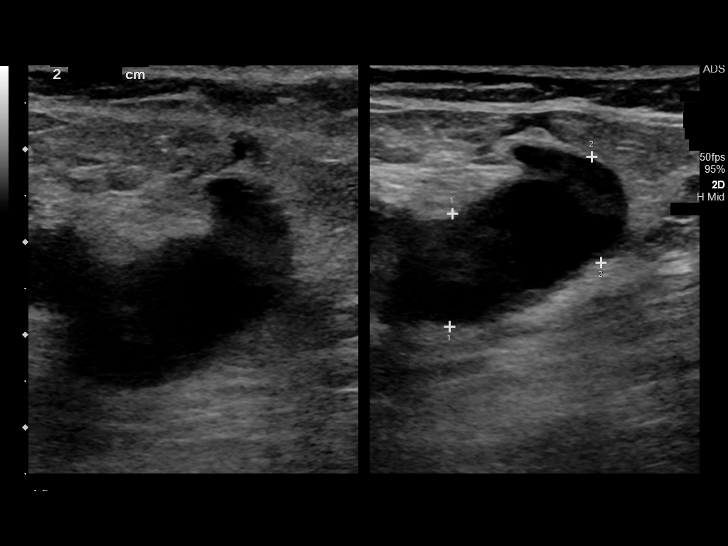
[im 14/52]
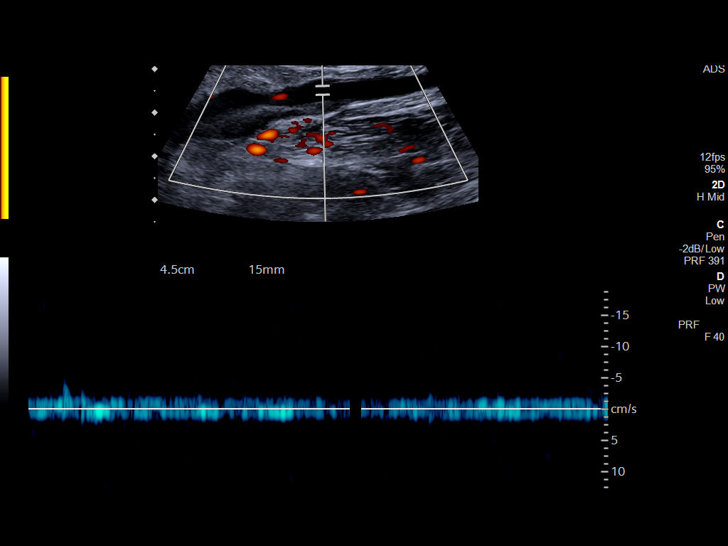
[im 16/52]
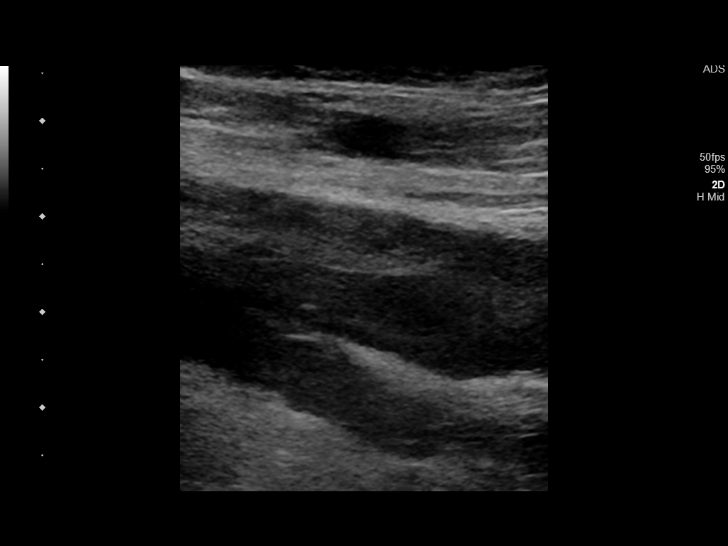
[im 20/52]
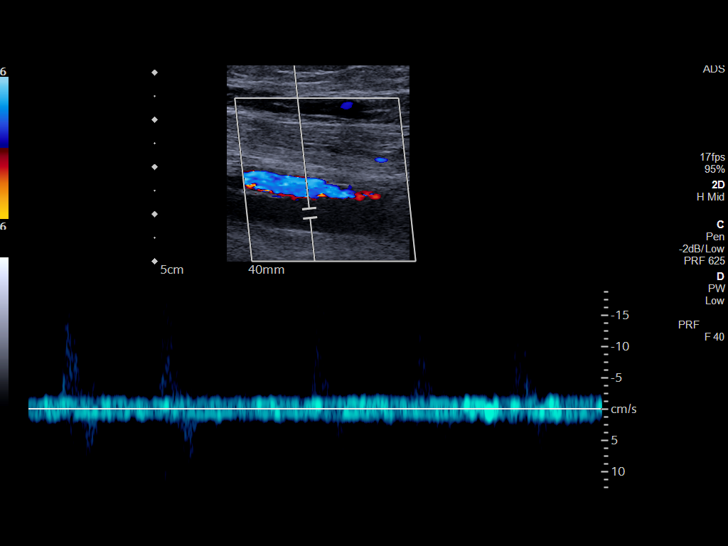
[im 25/52]
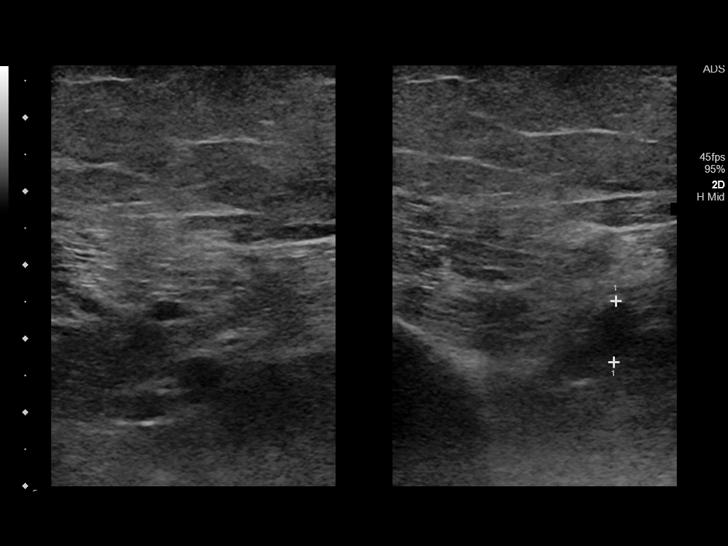
[im 27/52]
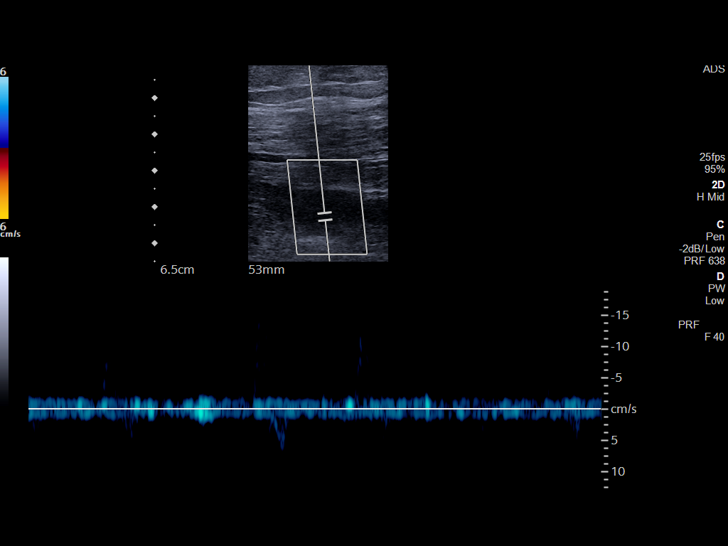
[im 32/52]
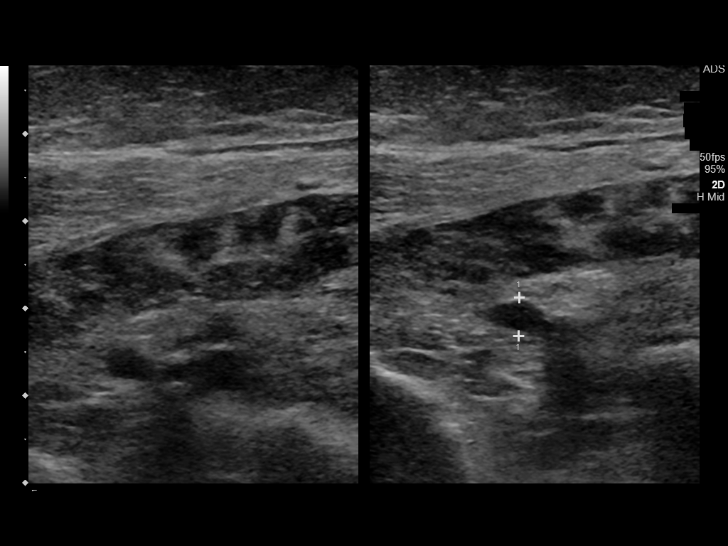
[im 36/52]
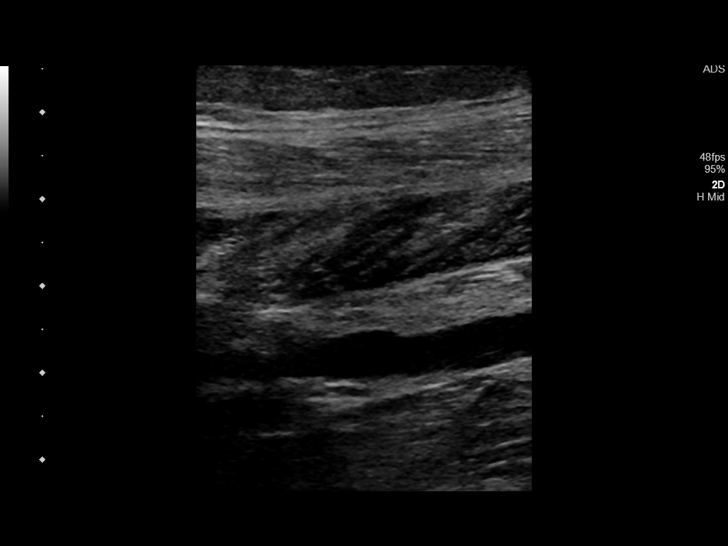
[im 40/52]
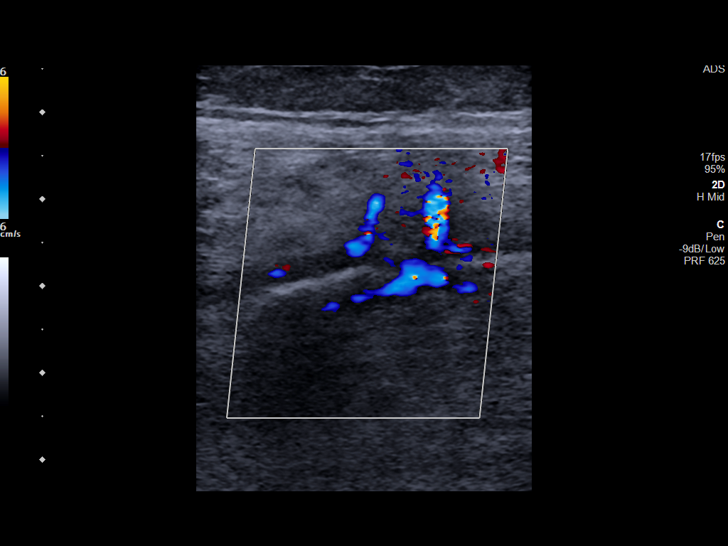
[im 43/52]
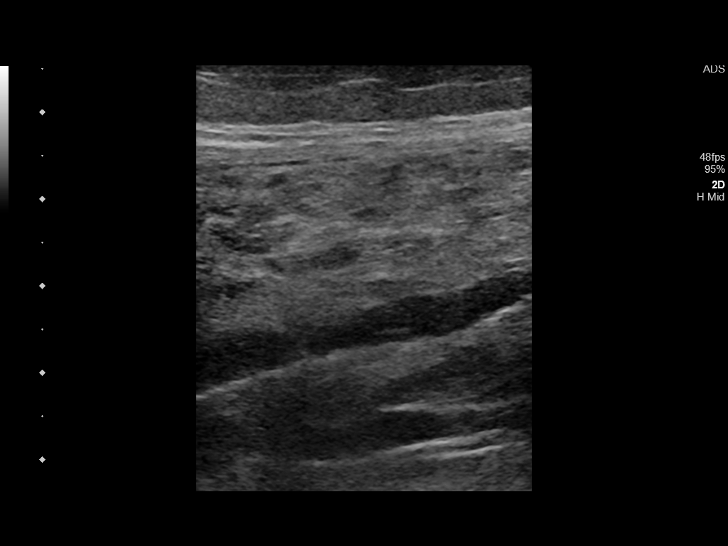
[im 47/52]
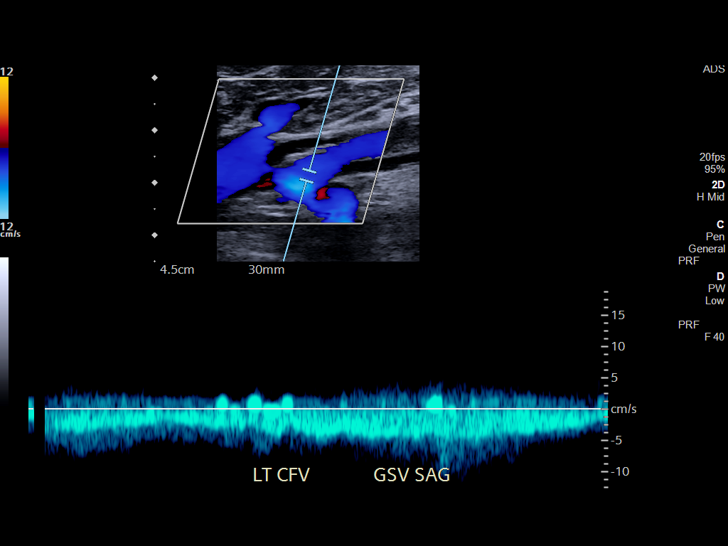
[im 52/52]
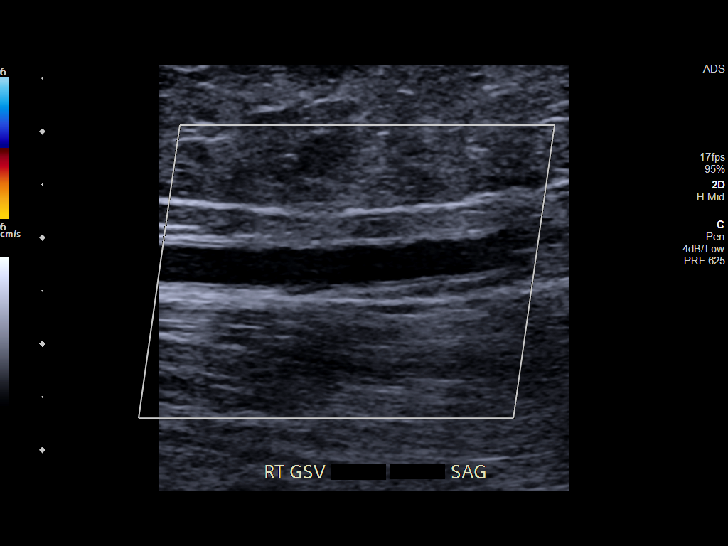

[14 of 24 positions shown; findings below may reference images not displayed]

FINDINGS: Hypoechoic thrombus in common femoral vein involving saphenofemoral
junction, extending into the femoral vein and deep femoral vein with
very limited flow on color Doppler. The thrombus extends into the
popliteal vein and is seen in posterior and peroneal veins, where it
is incompletely occlusive.

Survey views of the contralateral common femoral vein are
unremarkable.
IMPRESSION: 1. POSITIVE for extensive right calf, popliteal and femoral DVT,
largely occlusive.

## 2020-08-03 ENCOUNTER — Inpatient Hospital Stay: Payer: Medicare Other | Attending: Oncology

## 2020-08-03 ENCOUNTER — Encounter: Payer: Self-pay | Admitting: Oncology

## 2020-08-03 ENCOUNTER — Other Ambulatory Visit: Payer: Self-pay

## 2020-08-03 ENCOUNTER — Inpatient Hospital Stay (HOSPITAL_BASED_OUTPATIENT_CLINIC_OR_DEPARTMENT_OTHER): Payer: Medicare Other | Admitting: Oncology

## 2020-08-03 VITALS — BP 153/92 | HR 78 | Temp 96.5°F | Resp 16 | Wt 113.4 lb

## 2020-08-03 DIAGNOSIS — D509 Iron deficiency anemia, unspecified: Secondary | ICD-10-CM | POA: Diagnosis not present

## 2020-08-03 DIAGNOSIS — I10 Essential (primary) hypertension: Secondary | ICD-10-CM | POA: Insufficient documentation

## 2020-08-03 DIAGNOSIS — D696 Thrombocytopenia, unspecified: Secondary | ICD-10-CM

## 2020-08-03 DIAGNOSIS — E119 Type 2 diabetes mellitus without complications: Secondary | ICD-10-CM | POA: Insufficient documentation

## 2020-08-03 DIAGNOSIS — R634 Abnormal weight loss: Secondary | ICD-10-CM

## 2020-08-03 DIAGNOSIS — R5381 Other malaise: Secondary | ICD-10-CM | POA: Diagnosis not present

## 2020-08-03 DIAGNOSIS — M797 Fibromyalgia: Secondary | ICD-10-CM | POA: Insufficient documentation

## 2020-08-03 DIAGNOSIS — Z87891 Personal history of nicotine dependence: Secondary | ICD-10-CM | POA: Insufficient documentation

## 2020-08-03 DIAGNOSIS — R5383 Other fatigue: Secondary | ICD-10-CM | POA: Diagnosis not present

## 2020-08-03 DIAGNOSIS — Z7984 Long term (current) use of oral hypoglycemic drugs: Secondary | ICD-10-CM | POA: Insufficient documentation

## 2020-08-03 DIAGNOSIS — Z79899 Other long term (current) drug therapy: Secondary | ICD-10-CM | POA: Diagnosis not present

## 2020-08-03 DIAGNOSIS — H04123 Dry eye syndrome of bilateral lacrimal glands: Secondary | ICD-10-CM | POA: Insufficient documentation

## 2020-08-03 DIAGNOSIS — R110 Nausea: Secondary | ICD-10-CM | POA: Insufficient documentation

## 2020-08-03 DIAGNOSIS — Z7952 Long term (current) use of systemic steroids: Secondary | ICD-10-CM | POA: Diagnosis not present

## 2020-08-03 DIAGNOSIS — R519 Headache, unspecified: Secondary | ICD-10-CM | POA: Diagnosis not present

## 2020-08-03 DIAGNOSIS — Z7951 Long term (current) use of inhaled steroids: Secondary | ICD-10-CM | POA: Insufficient documentation

## 2020-08-03 DIAGNOSIS — J449 Chronic obstructive pulmonary disease, unspecified: Secondary | ICD-10-CM | POA: Diagnosis not present

## 2020-08-03 DIAGNOSIS — Z8701 Personal history of pneumonia (recurrent): Secondary | ICD-10-CM | POA: Insufficient documentation

## 2020-08-03 DIAGNOSIS — D6861 Antiphospholipid syndrome: Secondary | ICD-10-CM | POA: Insufficient documentation

## 2020-08-03 DIAGNOSIS — Z86718 Personal history of other venous thrombosis and embolism: Secondary | ICD-10-CM | POA: Insufficient documentation

## 2020-08-03 DIAGNOSIS — F329 Major depressive disorder, single episode, unspecified: Secondary | ICD-10-CM | POA: Insufficient documentation

## 2020-08-03 DIAGNOSIS — M1990 Unspecified osteoarthritis, unspecified site: Secondary | ICD-10-CM | POA: Insufficient documentation

## 2020-08-03 DIAGNOSIS — M329 Systemic lupus erythematosus, unspecified: Secondary | ICD-10-CM | POA: Insufficient documentation

## 2020-08-03 DIAGNOSIS — R9389 Abnormal findings on diagnostic imaging of other specified body structures: Secondary | ICD-10-CM

## 2020-08-03 LAB — CBC WITH DIFFERENTIAL/PLATELET
Abs Immature Granulocytes: 0.01 10*3/uL (ref 0.00–0.07)
Basophils Absolute: 0.1 10*3/uL (ref 0.0–0.1)
Basophils Relative: 1 %
Eosinophils Absolute: 0.2 10*3/uL (ref 0.0–0.5)
Eosinophils Relative: 3 %
HCT: 28 % — ABNORMAL LOW (ref 36.0–46.0)
Hemoglobin: 8.8 g/dL — ABNORMAL LOW (ref 12.0–15.0)
Immature Granulocytes: 0 %
Lymphocytes Relative: 30 %
Lymphs Abs: 1.5 10*3/uL (ref 0.7–4.0)
MCH: 24.7 pg — ABNORMAL LOW (ref 26.0–34.0)
MCHC: 31.4 g/dL (ref 30.0–36.0)
MCV: 78.7 fL — ABNORMAL LOW (ref 80.0–100.0)
Monocytes Absolute: 0.5 10*3/uL (ref 0.1–1.0)
Monocytes Relative: 10 %
Neutro Abs: 2.8 10*3/uL (ref 1.7–7.7)
Neutrophils Relative %: 56 %
Platelets: 49 10*3/uL — ABNORMAL LOW (ref 150–400)
RBC: 3.56 MIL/uL — ABNORMAL LOW (ref 3.87–5.11)
RDW: 14.9 % (ref 11.5–15.5)
WBC: 5.1 10*3/uL (ref 4.0–10.5)
nRBC: 0 % (ref 0.0–0.2)

## 2020-08-03 LAB — COMPREHENSIVE METABOLIC PANEL
ALT: 9 U/L (ref 0–44)
AST: 14 U/L — ABNORMAL LOW (ref 15–41)
Albumin: 3.7 g/dL (ref 3.5–5.0)
Alkaline Phosphatase: 55 U/L (ref 38–126)
Anion gap: 6 (ref 5–15)
BUN: 19 mg/dL (ref 8–23)
CO2: 27 mmol/L (ref 22–32)
Calcium: 8.8 mg/dL — ABNORMAL LOW (ref 8.9–10.3)
Chloride: 110 mmol/L (ref 98–111)
Creatinine, Ser: 1.05 mg/dL — ABNORMAL HIGH (ref 0.44–1.00)
GFR calc Af Amer: >60 mL/min (ref >60)
GFR calc non Af Amer: 55 mL/min — ABNORMAL LOW (ref >60)
Glucose, Bld: 89 mg/dL (ref 70–99)
Potassium: 4.4 mmol/L (ref 3.5–5.1)
Sodium: 143 mmol/L (ref 135–145)
Total Bilirubin: 0.5 mg/dL (ref 0.3–1.2)
Total Protein: 6.6 g/dL (ref 6.5–8.1)

## 2020-08-03 LAB — IRON AND TIBC
Iron: 27 ug/dL — ABNORMAL LOW (ref 28–170)
Saturation Ratios: 7 % — ABNORMAL LOW (ref 10.4–31.8)
TIBC: 396 ug/dL (ref 250–450)
UIBC: 369 ug/dL

## 2020-08-03 LAB — FERRITIN: Ferritin: 33 ng/mL (ref 11–307)

## 2020-08-06 NOTE — Progress Notes (Signed)
Hematology/Oncology Consult note [facility]  [phone] [phone]  Patient Care Team: Sharyne Peach, MD as PCP - General (Family Medicine)   Name of the patient: Alison Lopez  [id number]  1956-09-06   Date of visit: 08/06/20  Diagnosis-  1.  Antiphospholipid antibody syndrome and associated thrombocytopenia 2.  Abnormal weight loss  Chief complaint/ Reason for visit-routine follow-up of APS on Coumadin and thrombocytopenia  Heme/Onc history: Patient is a 64yr old female with prior h/o LLEPROXIMALDVT back in 1993 after an episode of pneumonia. Patient was started on coumadin back then and stayed on it. On 04/20/19 patient noticed pain and tightness in her RLE. She came to the ER and USG showed extensive right calf, femoral and popliteal DVT. INR was therapeutic at 2.2 on this day. Patient was switched to eliquis. Also seen by vascular surgery and underwent thrombectomy/ PTA and IVC filter placement. Patient reports intermittent headaches and nausea since she started taking eliquis. RLE swelling is getting better. She denies any DVT/PE in the 27 years she was on coumadin no pregnancy losses. No family h/o thrombosis. She has 2 adult children with no h/o DVT  At the time of DVT patient also noted to have iron deficiency anemia and thrombocytopenia. Platelet counts have been normal in 2018. Recent H/H was 7.7/25.3 and platelet count was 72. She did receive 1 dose of IV iron and currently she is taking oral iron. She denies any blood in stools. Stools have beendark since she started taking po iron  Interval history-continues to have significant weight loss and is down 213 pounds from the prior value of 116 pounds 3 months ago.Visual loss from the right eye.  Reports chronic fatigue.  Denies any blood loss in her stool or urine.  ECOG PS- 2 Pain scale- 3   Review of systems- Review of Systems  Constitutional: Positive for  malaise/fatigue and weight loss. Negative for chills and fever.  HENT: Negative for congestion, ear discharge and nosebleeds.   Eyes: Negative for blurred vision.  Respiratory: Negative for cough, hemoptysis, sputum production, shortness of breath and wheezing.   Cardiovascular: Negative for chest pain, palpitations, orthopnea and claudication.  Gastrointestinal: Negative for abdominal pain, blood in stool, constipation, diarrhea, heartburn, melena, nausea and vomiting.  Genitourinary: Negative for dysuria, flank pain, frequency, hematuria and urgency.  Musculoskeletal: Negative for back pain, joint pain and myalgias.  Skin: Negative for rash.  Neurological: Negative for dizziness, tingling, focal weakness, seizures, weakness and headaches.  Endo/Heme/Allergies: Does not bruise/bleed easily.  Psychiatric/Behavioral: Negative for depression and suicidal ideas. The patient does not have insomnia.        Allergies  Allergen Reactions  . Morphine And Related Anaphylaxis  . Montelukast     Other reaction(s): Other (See Comments) Nightmares  . Aspirin Other (See Comments)    Patient on blood thinners and was told not to take  . Benadryl [Diphenhydramine Hcl] Hives  . Codeine Other (See Comments)    AMS     Past Medical History:  Diagnosis Date  . Arthritis   . Collagen vascular disease ([place])   . COPD (chronic obstructive pulmonary disease) ([place])   . Depression   . Diabetes mellitus without complication ([place])   . DVT (deep venous thrombosis) ([place])   . Fibromyalgia   . Hypertension   . Lupus ([place])   . Osteoporosis      Past Surgical History:  Procedure Laterality Date  . ABDOMINAL HYSTERECTOMY    .  IVC FILTER REMOVAL N/A 07/21/2019   Procedure: IVC FILTER REMOVAL;  Surgeon: Algernon Huxley, MD;  Location: [place] [facility];  Service: Cardiovascular;  Laterality: N/A;  . LOWER EXTREMITY INTERVENTION Right 04/22/2019   Procedure: IVC Filter Insertion with Right Lower Extremity  Venous Lysis;  Surgeon: Algernon Huxley, MD;  Location: [place] [facility];  Service: Cardiovascular;  Laterality: Right;  . OOPHORECTOMY      Social History   Socioeconomic History  . Marital status: Divorced    Spouse name: Not on file  . Number of children: Not on file  . Years of education: Not on file  . Highest education level: Not on file  Occupational History  . Not on file  Tobacco Use  . Smoking status: Former Smoker    Packs/day: 1.00    Years: 17.00    Pack years: 17.00    Types: Cigarettes    Quit date: 2002    Years since quitting: 19.7  . Smokeless tobacco: Never Used  Vaping Use  . Vaping Use: Never used  Substance and Sexual Activity  . Alcohol use: Not Currently  . Drug use: Never  . Sexual activity: Not on file  Other Topics Concern  . Not on file  Social History Narrative  . Not on file   Social Determinants of Health   Financial Resource Strain:   . Difficulty of Paying Living Expenses: Not on file  Food Insecurity:   . Worried About Charity fundraiser in the Last Year: Not on file  . Ran Out of Food in the Last Year: Not on file  Transportation Needs:   . Lack of Transportation (Medical): Not on file  . Lack of Transportation (Non-Medical): Not on file  Physical Activity:   . Days of Exercise per Week: Not on file  . Minutes of Exercise per Session: Not on file  Stress:   . Feeling of Stress : Not on file  Social Connections:   . Frequency of Communication with Friends and Family: Not on file  . Frequency of Social Gatherings with Friends and Family: Not on file  . Attends Religious Services: Not on file  . Active Member of Clubs or Organizations: Not on file  . Attends Archivist Meetings: Not on file  . Marital Status: Not on file  Intimate Partner Violence:   . Fear of Current or Ex-Partner: Not on file  . Emotionally Abused: Not on file  . Physically Abused: Not on file  . Sexually Abused: Not on file    Family History   Problem Relation Age of Onset  . Stomach cancer Mother   . Heart attack Father   . Breast cancer Neg Hx      Current Outpatient Medications:  .  acetaminophen (TYLENOL) 650 MG CR tablet, Take 650 mg by mouth every 8 (eight) hours as needed for pain., Disp: , Rfl:  .  albuterol (PROVENTIL) (2.5 MG/3ML) 0.083% nebulizer solution, Take 2.5 mg by nebulization every 4 (four) hours as needed for wheezing or shortness of breath. , Disp: , Rfl:  .  albuterol (VENTOLIN HFA) 108 (90 Base) MCG/ACT inhaler, Inhale 2 puffs into the lungs every 4 (four) hours as needed for wheezing or shortness of breath., Disp: , Rfl:  .  apixaban (ELIQUIS) 5 MG TABS tablet, Take 1 tablet (5 mg total) by mouth 2 (two) times daily., Disp: 60 tablet, Rfl: 2 .  atropine 1 % ophthalmic solution, Place 1 drop  into the right eye in the morning and at bedtime., Disp: , Rfl:  .  brimonidine-timolol (COMBIGAN) 0.2-0.5 % ophthalmic solution, Place 3 drops into both eyes every 12 (twelve) hours., Disp: , Rfl:  .  Brinzolamide-Brimonidine 1-0.2 % SUSP, Apply 2.5 mLs to eye 1 day or 1 dose., Disp: , Rfl:  .  erythromycin ophthalmic ointment, Place 1 application into both eyes at bedtime., Disp: , Rfl:  .  esomeprazole (NEXIUM) 40 MG capsule, Take by mouth., Disp: , Rfl:  .  levothyroxine (SYNTHROID, LEVOTHROID) 88 MCG tablet, Take 75 mcg by mouth daily before breakfast. , Disp: , Rfl:  .  losartan (COZAAR) 50 MG tablet, Take 50 mg by mouth daily. prn, Disp: , Rfl:  .  metFORMIN (GLUCOPHAGE) 500 MG tablet, Take 500 mg by mouth daily. , Disp: , Rfl:  .  mometasone (NASONEX) 50 MCG/ACT nasal spray, Place 2 sprays into the nose daily., Disp: , Rfl:  .  neomycin-bacitracin-polymyxin (NEOSPORIN) ophthalmic ointment, Place 1 application into both eyes at bedtime., Disp: , Rfl:  .  prednisoLONE acetate (PRED FORTE) 1 % ophthalmic suspension, Place 1 drop into both eyes 1 day or 1 dose., Disp: , Rfl:  .  predniSONE (DELTASONE) 10 MG tablet,  Take 5 mg by mouth daily with breakfast. , Disp: , Rfl:  .  simvastatin (ZOCOR) 40 MG tablet, Take 40 mg by mouth daily., Disp: , Rfl:  .  SYMBICORT 160-4.5 MCG/ACT inhaler, , Disp: , Rfl:  .  ascorbic acid (VITAMIN C) 100 MG tablet, Take 1,000 mg by mouth daily. (Patient not taking: Reported on 08/03/2020), Disp: , Rfl:  .  buPROPion (WELLBUTRIN SR) 150 MG 12 hr tablet, Take 150 mg by mouth daily. (Patient not taking: Reported on 08/03/2020), Disp: , Rfl:  .  cyclobenzaprine (FLEXERIL) 5 MG tablet, Take 5 mg by mouth 3 (three) times daily as needed for muscle spasms. (Patient not taking: Reported on 08/03/2020), Disp: , Rfl:  .  DULoxetine (CYMBALTA) 20 MG capsule, Take 1 capsule by mouth 1 day or 1 dose. (Patient not taking: Reported on 08/03/2020), Disp: , Rfl:  .  ergocalciferol (VITAMIN D2) 1.25 MG (50000 UT) capsule, Take 50,000 Units by mouth every Friday.  (Patient not taking: Reported on 08/03/2020), Disp: , Rfl:  .  ferrous sulfate 325 (65 FE) MG tablet, Take by mouth. (Patient not taking: Reported on 08/03/2020), Disp: , Rfl:  .  furosemide (LASIX) 20 MG tablet, Take 1 tablet by mouth daily as needed. (Patient not taking: Reported on 08/03/2020), Disp: , Rfl:   Physical exam:  Vitals:   08/03/20 1040  BP: (!) 153/92  Pulse: 78  Resp: 16  Temp: (!) 96.5 F (35.8 C)  TempSrc: Tympanic  SpO2: 100%  Weight: 113 lb 6.4 oz (51.4 kg)   Physical Exam Constitutional:      Comments: Patient is thin and cachectic.  Appears in no acute distress  Cardiovascular:     Rate and Rhythm: Normal rate and regular rhythm.     Heart sounds: Normal heart sounds.  Pulmonary:     Effort: Pulmonary effort is normal.     Breath sounds: Normal breath sounds.  Abdominal:     General: Bowel sounds are normal.     Palpations: Abdomen is soft.  Skin:    General: Skin is warm and dry.  Neurological:     Mental Status: She is alert and oriented to person, place, and time.      CMP Latest Ref  Rng & Units  08/03/2020  Glucose 70 - 99 mg/dL 89  BUN 8 - 23 mg/dL 19  Creatinine 0.44 - 1.00 mg/dL 1.05(H)  Sodium 135 - 145 mmol/L 143  Potassium 3.5 - 5.1 mmol/L 4.4  Chloride 98 - 111 mmol/L 110  CO2 22 - 32 mmol/L 27  Calcium 8.9 - 10.3 mg/dL 8.8(L)  Total Protein 6.5 - 8.1 g/dL 6.6  Total Bilirubin 0.3 - 1.2 mg/dL 0.5  Alkaline Phos 38 - 126 U/L 55  AST 15 - 41 U/L 14(L)  ALT 0 - 44 U/L 9   CBC Latest Ref Rng & Units 08/03/2020  WBC 4.0 - 10.5 K/uL 5.1  Hemoglobin 12.0 - 15.0 g/dL 8.8(L)  Hematocrit 36 - 46 % 28.0(L)  Platelets 150 - 400 K/uL 49(L)      Assessment and plan- Patient is a 64 y.o. female with history of antiphospholipid antibody syndrome on Eliquis and thrombocytopenia here for routine follow-up  1.  Antiphospholipid antibody syndrome: Failed Coumadin.  Currently on Eliquis with no recent thrombotic events.  She will continue taking that indefinitely.  2.  Thrombocytopenia:This has been waxing and waning and platelets have been fluctuating between 40s to 70s.  Continue to monitor especially given that she is on Eliquis.  We will repeat CBC in 1 month and 2 months and see her back in 2 months.  Because of thrombocytopenia is unclear.  She does not have any known autoimmune conditions.  Sometimes it can be associated from antiphospholipid antibody syndrome itself.  3.  Abnormal weight loss: Etiology unclear.  She recently had CT chest abdomen and pelvis with contrast in June 2021 which showed bilateral patchy groundglass opacities and malignancy was not excluded.  She has seen Dr. Raul Del and has a follow-up coming up with him in a couple of weeks as well.  She has not had a bronchoscopy yet.   Visit Diagnosis 1. Antiphospholipid antibody syndrome (HCC)   2. Thrombocytopenia ([place])      Dr. Randa Evens, MD, MPH [facility] [phone] 08/06/2020 3:37 PM

## 2020-08-21 ENCOUNTER — Other Ambulatory Visit: Payer: Self-pay | Admitting: Specialist

## 2020-08-21 DIAGNOSIS — J439 Emphysema, unspecified: Secondary | ICD-10-CM

## 2020-08-21 DIAGNOSIS — J849 Interstitial pulmonary disease, unspecified: Secondary | ICD-10-CM

## 2020-08-28 ENCOUNTER — Ambulatory Visit: Payer: Medicare Other | Attending: Specialist

## 2020-09-04 ENCOUNTER — Inpatient Hospital Stay: Payer: Medicare Other | Attending: Oncology

## 2020-10-01 IMAGING — CT CT CHEST W/ CM
2 of 5 series · 12 of 36 positions shown, 15 images · IV contrast (omnipaque)
Comparison: None.

CLINICAL DATA: Unintentional weight loss, DVT with
thrombocytopenia.

EXAM:
CT CHEST, ABDOMEN, AND PELVIS WITH CONTRAST
TECHNIQUE: Multidetector CT imaging of the chest, abdomen and pelvis was
performed following the standard protocol during bolus
administration of intravenous contrast.
CONTRAST:  85mL OMNIPAQUE IOHEXOL 300 MG/ML  SOLN

[Series 2: axials cap 5.00 · axial · 0.66mm/px · z∈[-1515,-1010]mm · 9 of 125 slices shown, 12 images]
[im 12/125  mediastinal]
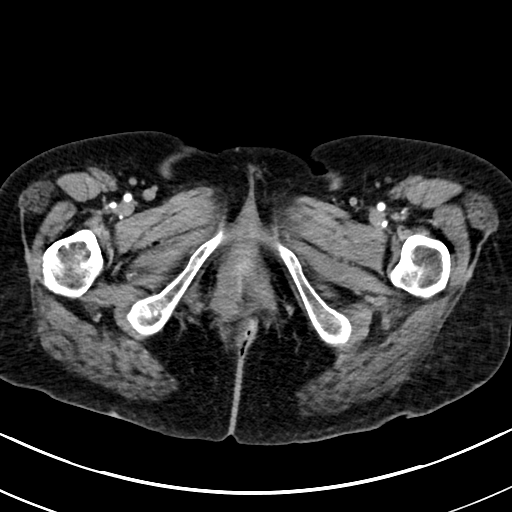
[im 12/125  lung]
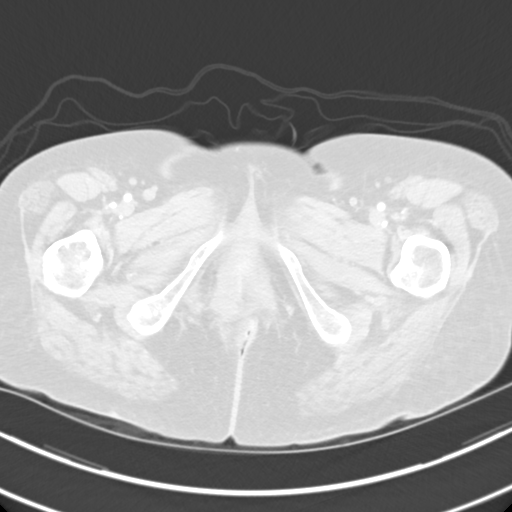
[im 23/125  lung]
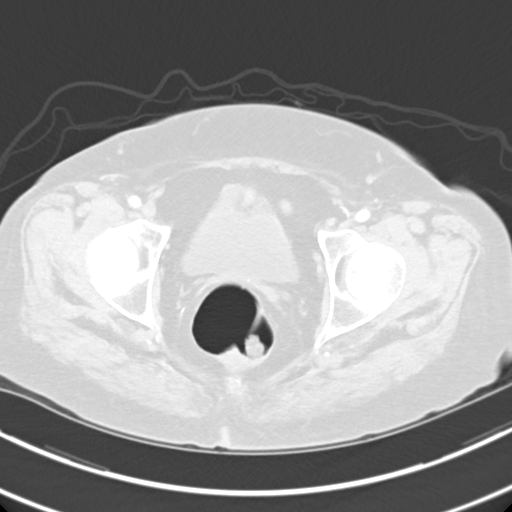
[im 34/125  lung]
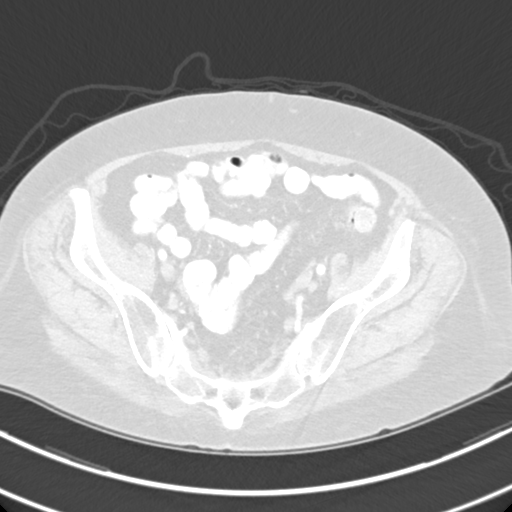
[im 46/125  lung]
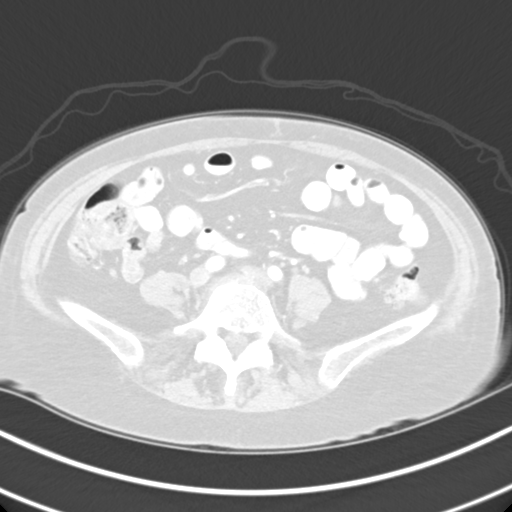
[im 68/125  mediastinal]
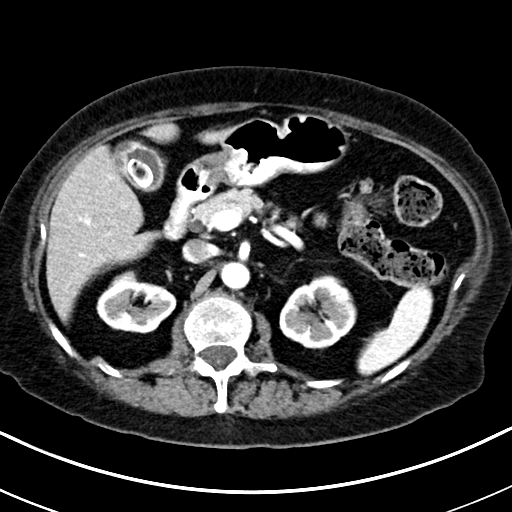
[im 68/125  lung]
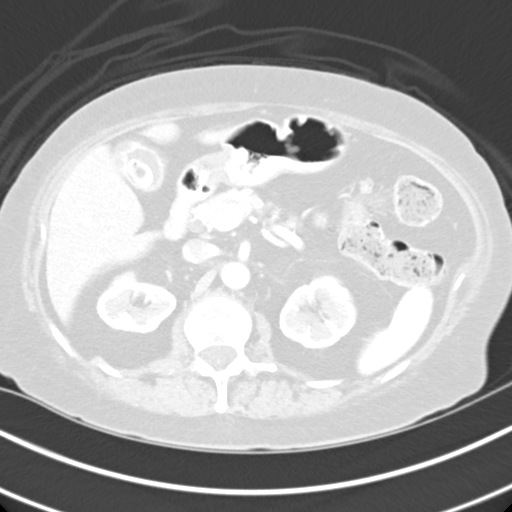
[im 79/125  lung]
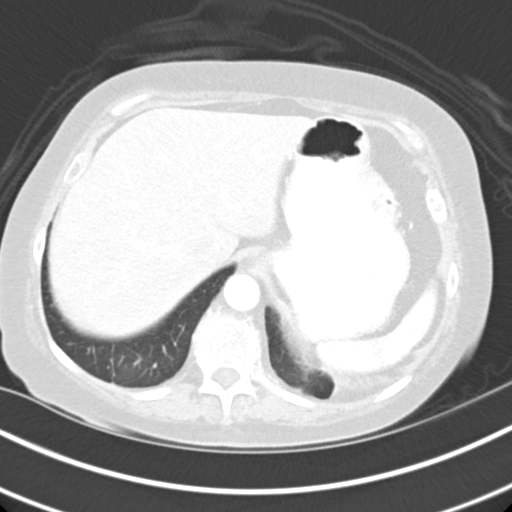
[im 91/125  lung]
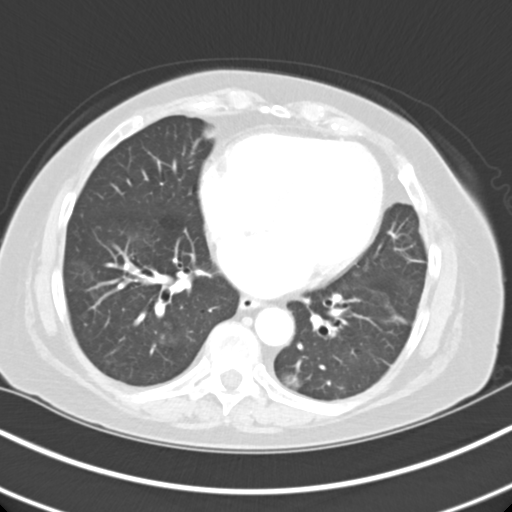
[im 102/125  lung]
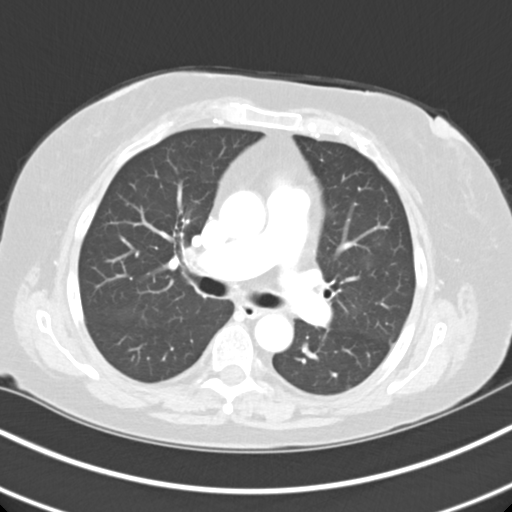
[im 113/125  mediastinal]
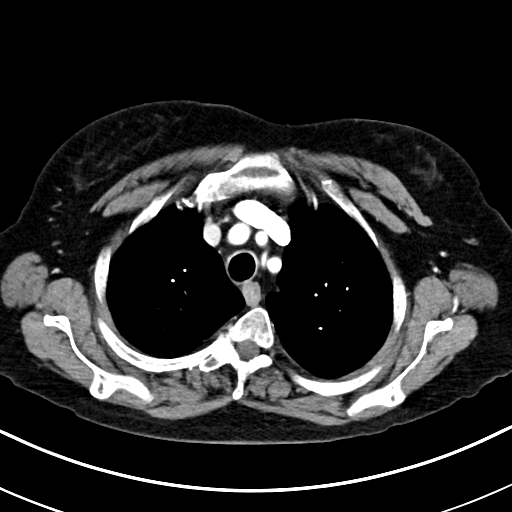
[im 113/125  lung]
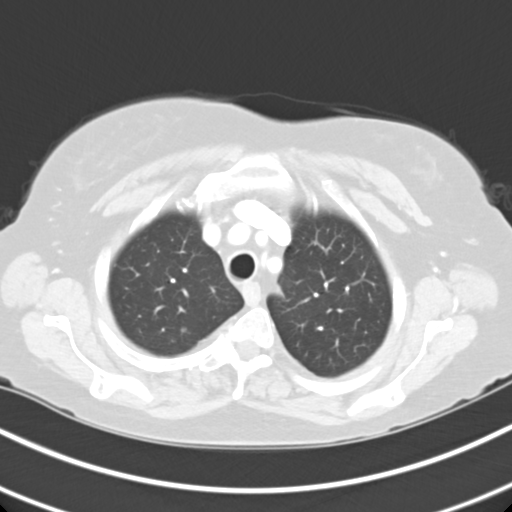

[Series 4: coronals cap 2.00 cor · coronal · 0.66mm/px · 3 of 133 slices shown]
[im 27/133  lung]
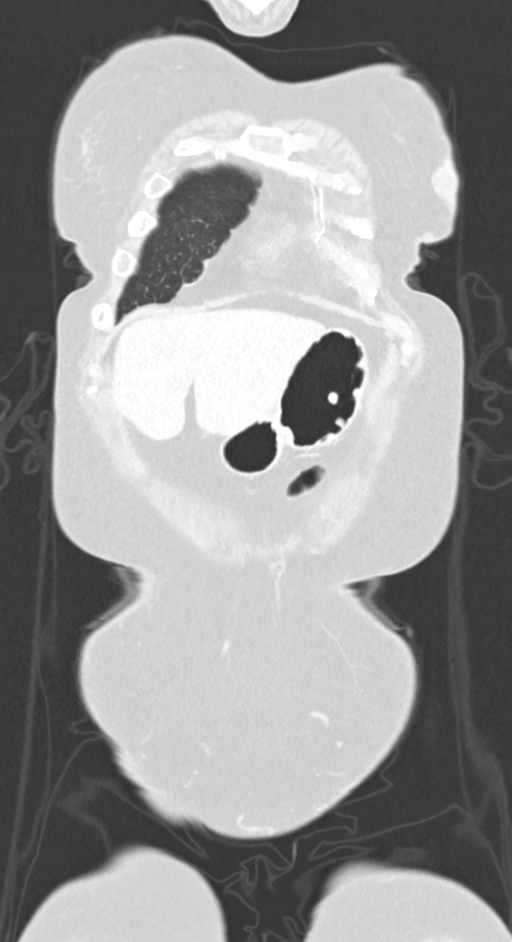
[im 53/133  lung]
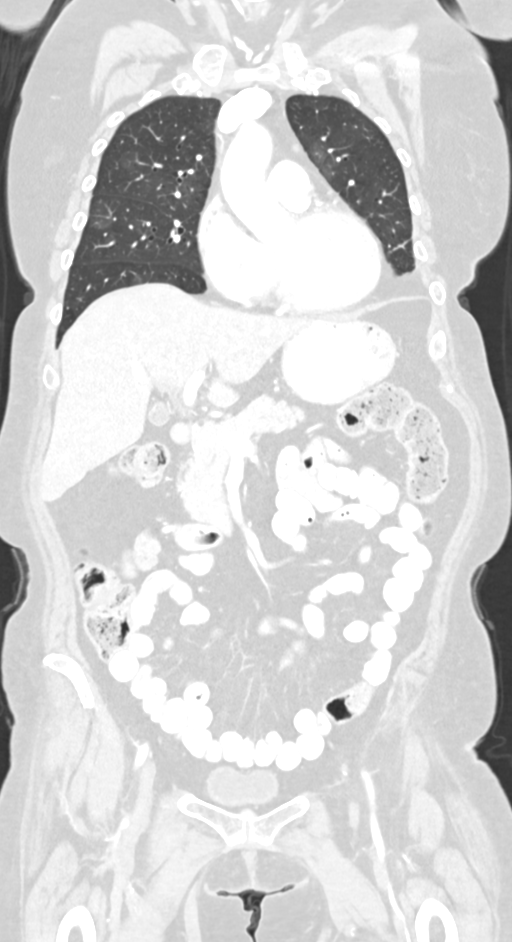
[im 80/133  lung]
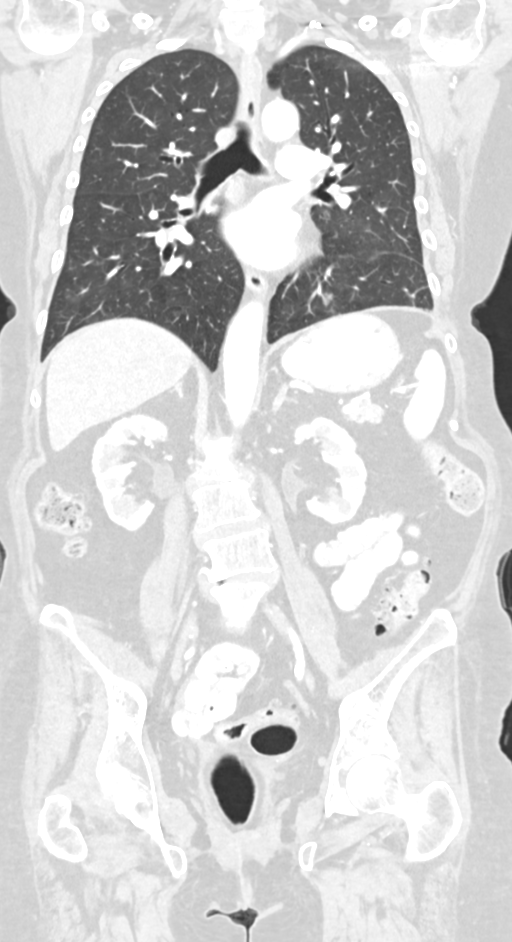

[12 of 36 positions shown; findings below may reference images not displayed]

FINDINGS: CT CHEST FINDINGS

Cardiovascular: Atherosclerotic calcification of the aorta. Heart is
mildly enlarged. No pericardial effusion.

Mediastinum/Nodes: Mediastinal and hilar lymph nodes are not
enlarged by CT size criteria. No axillary adenopathy. Esophagus is
unremarkable.

Lungs/Pleura: Mild centrilobular emphysema. Patchy bilateral
peribronchovascular ground-glass, basilar and somewhat peripheral
predominant. A few scattered pulmonary nodules measure 3 mm or less
in size. Scarring in the lingula and left lower lobe. No pleural
fluid. Airway is unremarkable.

Musculoskeletal: Degenerative changes in the spine. Old bilateral
rib fractures. No worrisome lytic or sclerotic lesions.

CT ABDOMEN PELVIS FINDINGS

Hepatobiliary: Liver is unremarkable. Large stones in the
gallbladder measure up to 2.1 cm. No biliary ductal dilatation.

Pancreas: Negative.

Spleen: Negative.

Adrenals/Urinary Tract: Adrenal glands are unremarkable. Renal
parenchymal thinning bilaterally. Subcentimeter low-attenuation
lesion in the interpolar right kidney is too small to characterize
but statistically, a cyst is likely. Kidneys are otherwise
unremarkable. Ureters are decompressed. Bladder is grossly
unremarkable.

Stomach/Bowel: Stomach, small bowel, appendix and colon are
unremarkable.

Vascular/Lymphatic: Atherosclerotic calcification of the aorta
without aneurysm. No pathologically enlarged lymph nodes.

Reproductive: Hysterectomy.  No adnexal mass.

Other: No free fluid.  Mesenteries and peritoneum are unremarkable.

Musculoskeletal: Degenerative changes in the spine. Prominent
Schmorl's node and probable mild compression deformity of the
superior endplate of L2, chronic.
IMPRESSION: 1. No findings to explain the patient's given clinical history.
2. Patchy peribronchovascular ground-glass with somewhat of a
peripheral and basilar distribution. There is a spectrum of findings
in the lungs which can be seen with acute atypical infection (as
well as other non-infectious etiologies). In particular, viral
pneumonia (including YCH07-DA) should be considered in the
appropriate clinical setting.
3. Few pulmonary nodules measure 3 mm or less in size. No follow-up
needed if patient is low-risk (and has no known or suspected primary
neoplasm). Non-contrast chest CT can be considered in 12 months if
patient is high-risk. This recommendation follows the consensus
statement: Guidelines for Management of Incidental Pulmonary Nodules
Detected on CT Images: From the [HOSPITAL] 2048; Radiology
4. Cholelithiasis.
5.  Aortic atherosclerosis (9UE4U-170.0).

## 2020-10-01 IMAGING — CT CT ABD-PELV W/ CM
2 of 5 series · 13 of 46 positions shown, 15 images · IV contrast (omnipaque)
Comparison: None.

CLINICAL DATA: Unintentional weight loss, DVT with
thrombocytopenia.

EXAM:
CT CHEST, ABDOMEN, AND PELVIS WITH CONTRAST
TECHNIQUE: Multidetector CT imaging of the chest, abdomen and pelvis was
performed following the standard protocol during bolus
administration of intravenous contrast.
CONTRAST:  85mL OMNIPAQUE IOHEXOL 300 MG/ML  SOLN

[Series 2: axials cap 5.00 · axial · 0.66mm/px · z∈[-1520,-1005]mm · 10 of 125 slices shown, 12 images]
[im 11/125  soft-tissue]
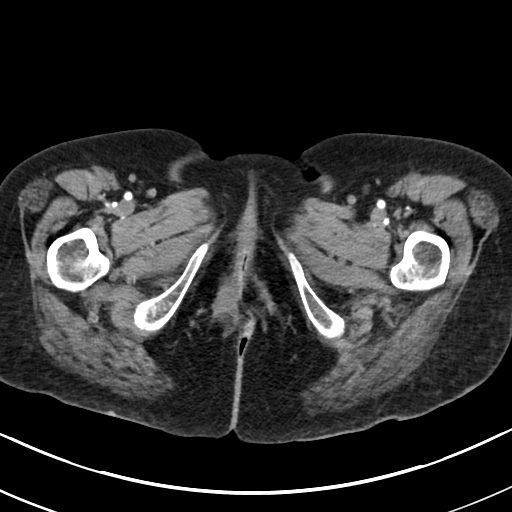
[im 11/125  bone]
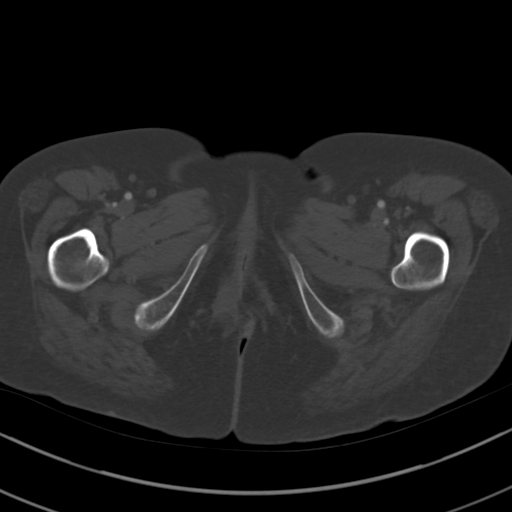
[im 21/125  soft-tissue]
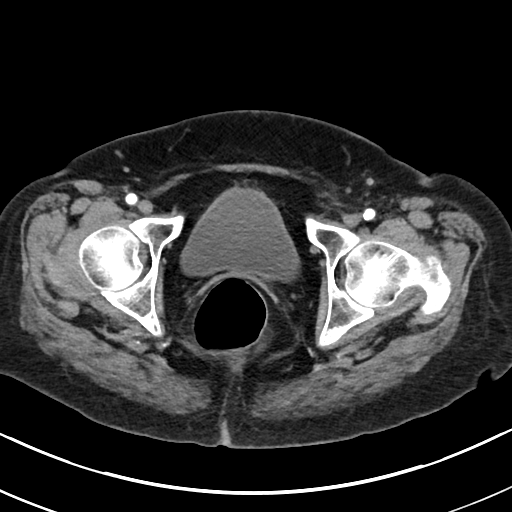
[im 32/125  soft-tissue]
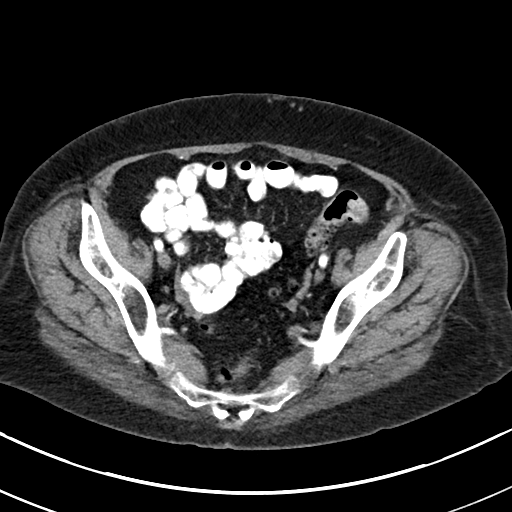
[im 42/125  soft-tissue]
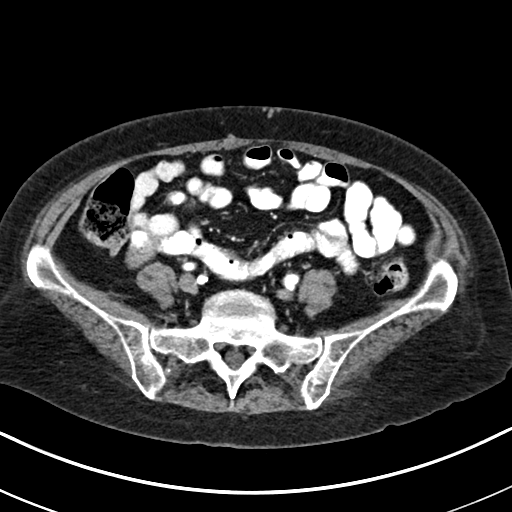
[im 52/125  soft-tissue]
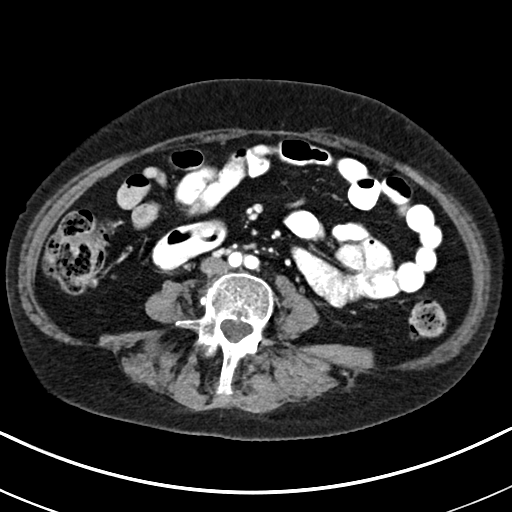
[im 73/125  soft-tissue]
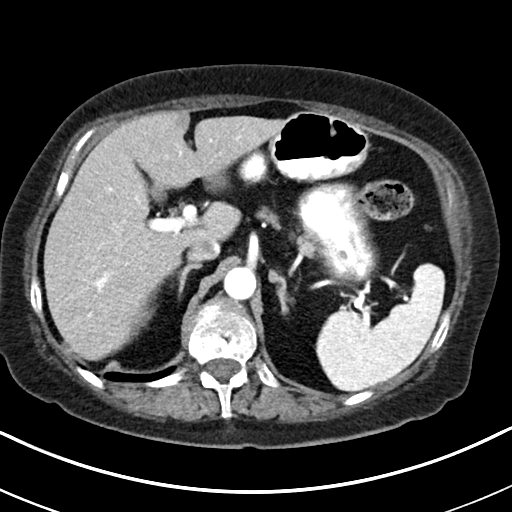
[im 83/125  soft-tissue]
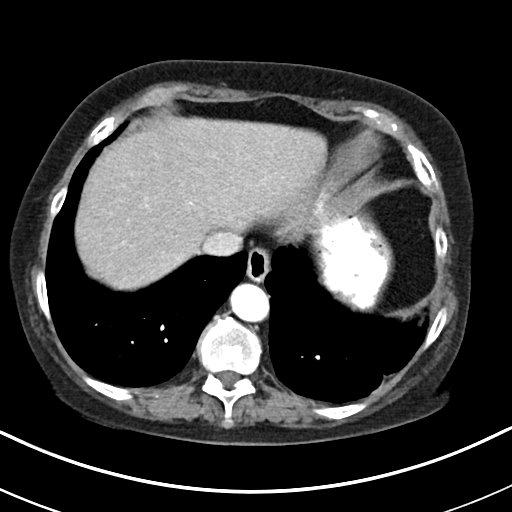
[im 94/125  soft-tissue]
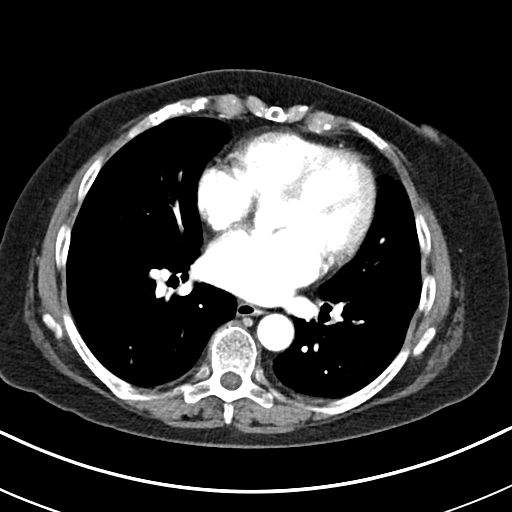
[im 104/125  soft-tissue]
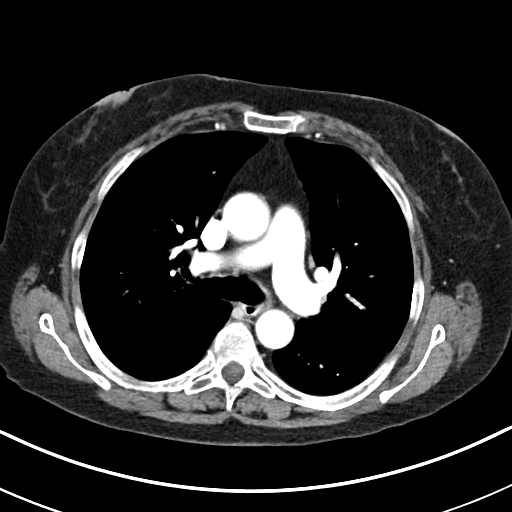
[im 104/125  bone]
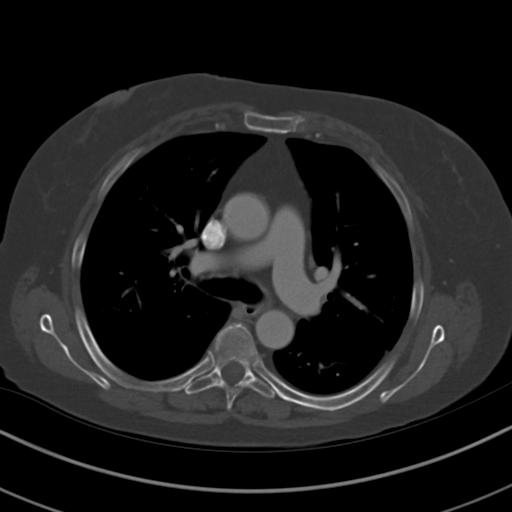
[im 114/125  soft-tissue]
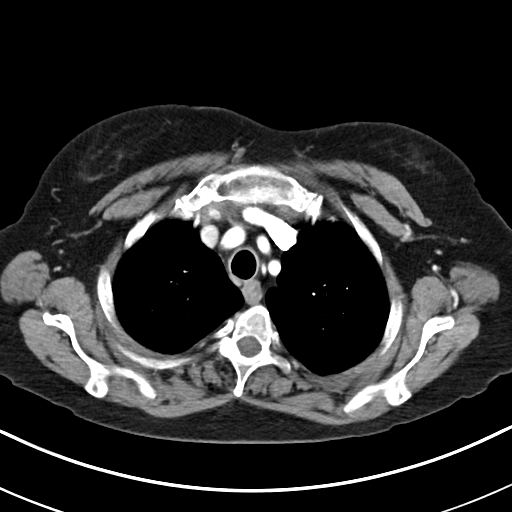

[Series 4: coronals cap 2.00 cor · coronal · 0.66mm/px · 3 of 133 slices shown]
[im 45/133  soft-tissue]
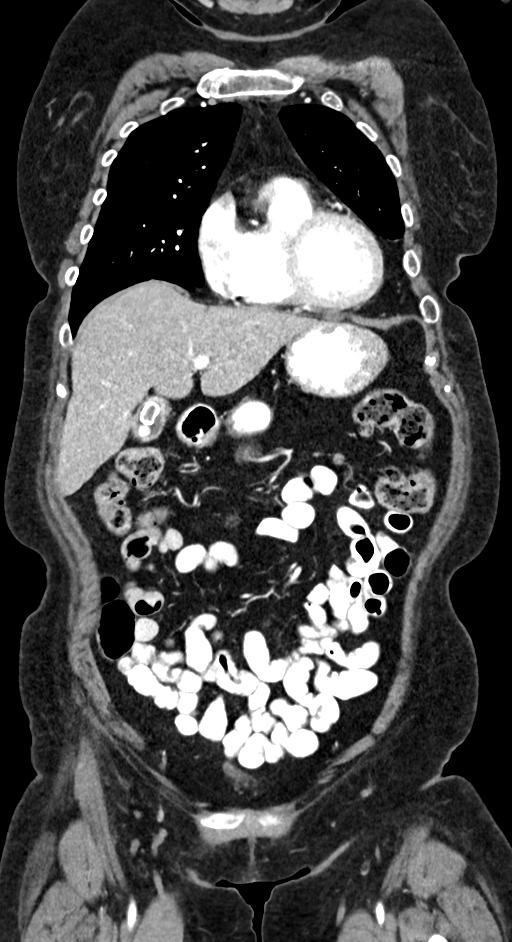
[im 59/133  soft-tissue]
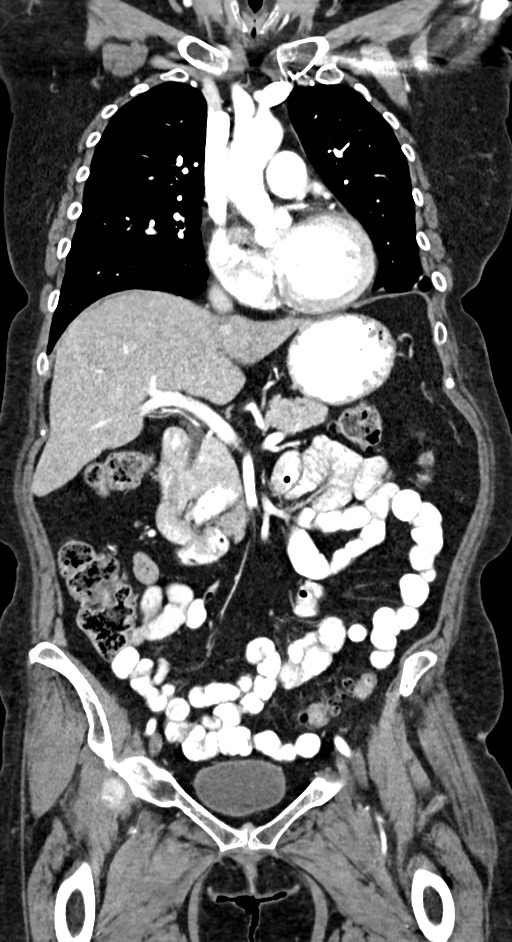
[im 74/133  soft-tissue]
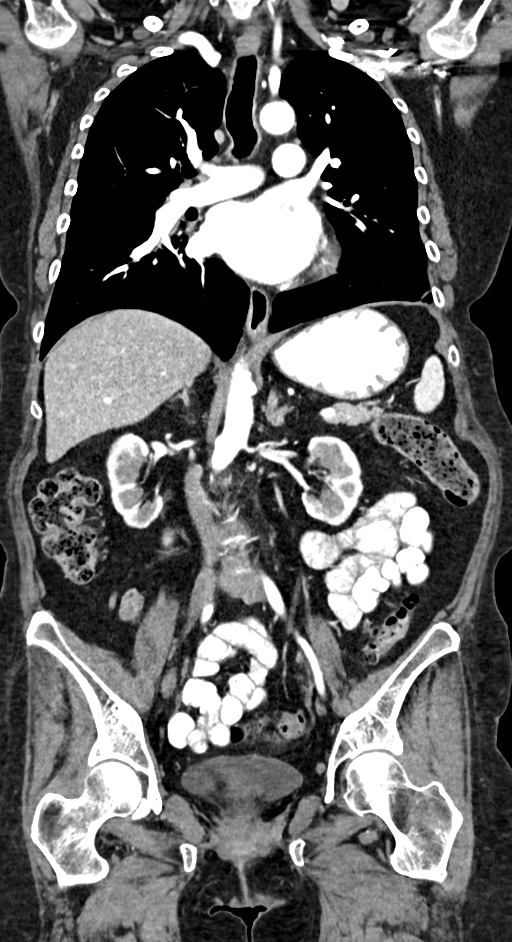

[13 of 46 positions shown; findings below may reference images not displayed]

FINDINGS: CT CHEST FINDINGS

Cardiovascular: Atherosclerotic calcification of the aorta. Heart is
mildly enlarged. No pericardial effusion.

Mediastinum/Nodes: Mediastinal and hilar lymph nodes are not
enlarged by CT size criteria. No axillary adenopathy. Esophagus is
unremarkable.

Lungs/Pleura: Mild centrilobular emphysema. Patchy bilateral
peribronchovascular ground-glass, basilar and somewhat peripheral
predominant. A few scattered pulmonary nodules measure 3 mm or less
in size. Scarring in the lingula and left lower lobe. No pleural
fluid. Airway is unremarkable.

Musculoskeletal: Degenerative changes in the spine. Old bilateral
rib fractures. No worrisome lytic or sclerotic lesions.

CT ABDOMEN PELVIS FINDINGS

Hepatobiliary: Liver is unremarkable. Large stones in the
gallbladder measure up to 2.1 cm. No biliary ductal dilatation.

Pancreas: Negative.

Spleen: Negative.

Adrenals/Urinary Tract: Adrenal glands are unremarkable. Renal
parenchymal thinning bilaterally. Subcentimeter low-attenuation
lesion in the interpolar right kidney is too small to characterize
but statistically, a cyst is likely. Kidneys are otherwise
unremarkable. Ureters are decompressed. Bladder is grossly
unremarkable.

Stomach/Bowel: Stomach, small bowel, appendix and colon are
unremarkable.

Vascular/Lymphatic: Atherosclerotic calcification of the aorta
without aneurysm. No pathologically enlarged lymph nodes.

Reproductive: Hysterectomy.  No adnexal mass.

Other: No free fluid.  Mesenteries and peritoneum are unremarkable.

Musculoskeletal: Degenerative changes in the spine. Prominent
Schmorl's node and probable mild compression deformity of the
superior endplate of L2, chronic.
IMPRESSION: 1. No findings to explain the patient's given clinical history.
2. Patchy peribronchovascular ground-glass with somewhat of a
peripheral and basilar distribution. There is a spectrum of findings
in the lungs which can be seen with acute atypical infection (as
well as other non-infectious etiologies). In particular, viral
pneumonia (including YCH07-DA) should be considered in the
appropriate clinical setting.
3. Few pulmonary nodules measure 3 mm or less in size. No follow-up
needed if patient is low-risk (and has no known or suspected primary
neoplasm). Non-contrast chest CT can be considered in 12 months if
patient is high-risk. This recommendation follows the consensus
statement: Guidelines for Management of Incidental Pulmonary Nodules
Detected on CT Images: From the [HOSPITAL] 2048; Radiology
4. Cholelithiasis.
5.  Aortic atherosclerosis (9UE4U-170.0).

## 2020-10-09 ENCOUNTER — Inpatient Hospital Stay: Payer: Medicare Other | Admitting: Oncology

## 2020-10-09 ENCOUNTER — Inpatient Hospital Stay: Payer: Medicare Other

## 2020-10-29 ENCOUNTER — Inpatient Hospital Stay: Payer: Medicare Other

## 2020-10-29 ENCOUNTER — Inpatient Hospital Stay (HOSPITAL_BASED_OUTPATIENT_CLINIC_OR_DEPARTMENT_OTHER): Payer: Medicare Other | Admitting: Oncology

## 2020-10-29 ENCOUNTER — Encounter: Payer: Self-pay | Admitting: Oncology

## 2020-10-29 ENCOUNTER — Other Ambulatory Visit: Payer: Self-pay

## 2020-10-29 ENCOUNTER — Inpatient Hospital Stay: Payer: Medicare Other | Attending: Oncology

## 2020-10-29 VITALS — BP 140/80 | HR 72 | Temp 98.6°F | Resp 16 | Wt 111.2 lb

## 2020-10-29 DIAGNOSIS — D509 Iron deficiency anemia, unspecified: Secondary | ICD-10-CM

## 2020-10-29 DIAGNOSIS — Z86718 Personal history of other venous thrombosis and embolism: Secondary | ICD-10-CM

## 2020-10-29 DIAGNOSIS — D696 Thrombocytopenia, unspecified: Secondary | ICD-10-CM

## 2020-10-29 DIAGNOSIS — D6861 Antiphospholipid syndrome: Secondary | ICD-10-CM

## 2020-10-29 DIAGNOSIS — Z23 Encounter for immunization: Secondary | ICD-10-CM | POA: Insufficient documentation

## 2020-10-29 LAB — CBC WITH DIFFERENTIAL/PLATELET
Abs Immature Granulocytes: 0.01 10*3/uL (ref 0.00–0.07)
Basophils Absolute: 0.1 10*3/uL (ref 0.0–0.1)
Basophils Relative: 1 %
Eosinophils Absolute: 0.2 10*3/uL (ref 0.0–0.5)
Eosinophils Relative: 3 %
HCT: 28 % — ABNORMAL LOW (ref 36.0–46.0)
Hemoglobin: 8.8 g/dL — ABNORMAL LOW (ref 12.0–15.0)
Immature Granulocytes: 0 %
Lymphocytes Relative: 28 %
Lymphs Abs: 1.5 10*3/uL (ref 0.7–4.0)
MCH: 23.9 pg — ABNORMAL LOW (ref 26.0–34.0)
MCHC: 31.4 g/dL (ref 30.0–36.0)
MCV: 76.1 fL — ABNORMAL LOW (ref 80.0–100.0)
Monocytes Absolute: 0.4 10*3/uL (ref 0.1–1.0)
Monocytes Relative: 8 %
Neutro Abs: 3.1 10*3/uL (ref 1.7–7.7)
Neutrophils Relative %: 60 %
Platelets: 59 10*3/uL — ABNORMAL LOW (ref 150–400)
RBC: 3.68 MIL/uL — ABNORMAL LOW (ref 3.87–5.11)
RDW: 17.3 % — ABNORMAL HIGH (ref 11.5–15.5)
WBC: 5.3 10*3/uL (ref 4.0–10.5)
nRBC: 0 % (ref 0.0–0.2)

## 2020-10-29 MED ORDER — INFLUENZA VAC A&B SA ADJ QUAD 0.5 ML IM PRSY
0.5000 mL | PREFILLED_SYRINGE | Freq: Once | INTRAMUSCULAR | Status: AC
  Administered 2020-10-29: 11:00:00 0.5 mL via INTRAMUSCULAR
  Filled 2020-10-29: qty 0.5

## 2020-10-29 NOTE — Progress Notes (Signed)
Hematology/Oncology Consult note [facility]  [phone] [phone]  Patient Care Team: Sharyne Peach, MD as PCP - General (Family Medicine) Sindy Guadeloupe, MD as Consulting Physician (Hematology and Oncology)   Name of the patient: Alison Lopez  [id number]  December 21, 1955   Date of visit: 10/29/20  Diagnosis- 1. .Antiphospholipid antibody syndrome and associated thrombocytopenia 2.Abnormal weight loss  Chief complaint/ Reason for visit-routine follow-up of APS on Coumadin and thrombocytopenia  Heme/Onc history: Patient is a 64yr old female with prior h/o LLEPROXIMALDVT back in 1993 after an episode of pneumonia. Patient was started on coumadin back then and stayed on it. On 04/20/19 patient noticed pain and tightness in her RLE. She came to the ER and USG showed extensive right calf, femoral and popliteal DVT. INR was therapeutic at 2.2 on this day. Patient was switched to eliquis. Also seen by vascular surgery and underwent thrombectomy/ PTA and IVC filter placement. Patient reports intermittent headaches and nausea since she started taking eliquis. RLE swelling is getting better. She denies any DVT/PE in the 27 years she was on coumadin no pregnancy losses. No family h/o thrombosis. She has 2 adult children with no h/o DVT  At the time of DVT patient also noted to have iron deficiency anemia and thrombocytopenia. Platelet counts have been normal in 2018. Recent H/H was 7.7/25.3 and platelet count was 72. She did receive 1 dose of IV iron and currently she is taking oral iron. She denies any blood in stools. Stools have beendark since she started taking po iron  Interval history-she reports feeling a little better and eating better.  Weight has remained more or less stable.  Denies any bleeding.  She remains compliant with her Eliquis  ECOG PS- 2 Pain scale- 0   Review of systems- Review of Systems  Constitutional: Positive for  malaise/fatigue. Negative for chills, fever and weight loss.  HENT: Negative for congestion, ear discharge and nosebleeds.   Eyes: Negative for blurred vision.  Respiratory: Negative for cough, hemoptysis, sputum production, shortness of breath and wheezing.   Cardiovascular: Negative for chest pain, palpitations, orthopnea and claudication.  Gastrointestinal: Negative for abdominal pain, blood in stool, constipation, diarrhea, heartburn, melena, nausea and vomiting.  Genitourinary: Negative for dysuria, flank pain, frequency, hematuria and urgency.  Musculoskeletal: Negative for back pain, joint pain and myalgias.  Skin: Negative for rash.  Neurological: Negative for dizziness, tingling, focal weakness, seizures, weakness and headaches.  Endo/Heme/Allergies: Does not bruise/bleed easily.  Psychiatric/Behavioral: Negative for depression and suicidal ideas. The patient does not have insomnia.       Allergies  Allergen Reactions  . Morphine And Related Anaphylaxis  . Montelukast     Other reaction(s): Other (See Comments) Nightmares  . Aspirin Other (See Comments)    Patient on blood thinners and was told not to take  . Benadryl [Diphenhydramine Hcl] Hives  . Codeine Other (See Comments)    AMS     Past Medical History:  Diagnosis Date  . Arthritis   . Collagen vascular disease ([place])   . COPD (chronic obstructive pulmonary disease) ([place])   . Depression   . Diabetes mellitus without complication ([place])   . DVT (deep venous thrombosis) ([place])   . Fibromyalgia   . Hypertension   . Lupus ([place])   . Osteoporosis      Past Surgical History:  Procedure Laterality Date  . ABDOMINAL HYSTERECTOMY    . IVC FILTER REMOVAL N/A 07/21/2019  Procedure: IVC FILTER REMOVAL;  Surgeon: Algernon Huxley, MD;  Location: [place] [facility];  Service: Cardiovascular;  Laterality: N/A;  . LOWER EXTREMITY INTERVENTION Right 04/22/2019   Procedure: IVC Filter Insertion with Right Lower Extremity  Venous Lysis;  Surgeon: Algernon Huxley, MD;  Location: [place] [facility];  Service: Cardiovascular;  Laterality: Right;  . OOPHORECTOMY      Social History   Socioeconomic History  . Marital status: Divorced    Spouse name: Not on file  . Number of children: Not on file  . Years of education: Not on file  . Highest education level: Not on file  Occupational History  . Not on file  Tobacco Use  . Smoking status: Former Smoker    Packs/day: 1.00    Years: 17.00    Pack years: 17.00    Types: Cigarettes    Quit date: 2002    Years since quitting: 19.9  . Smokeless tobacco: Never Used  Vaping Use  . Vaping Use: Never used  Substance and Sexual Activity  . Alcohol use: Not Currently  . Drug use: Never  . Sexual activity: Not on file  Other Topics Concern  . Not on file  Social History Narrative  . Not on file   Social Determinants of Health   Financial Resource Strain: Not on file  Food Insecurity: Not on file  Transportation Needs: Not on file  Physical Activity: Not on file  Stress: Not on file  Social Connections: Not on file  Intimate Partner Violence: Not on file    Family History  Problem Relation Age of Onset  . Stomach cancer Mother   . Heart attack Father   . Breast cancer Neg Hx      Current Outpatient Medications:  .  acetaminophen (TYLENOL) 650 MG CR tablet, Take 650 mg by mouth every 8 (eight) hours as needed for pain., Disp: , Rfl:  .  albuterol (PROVENTIL) (2.5 MG/3ML) 0.083% nebulizer solution, Take 2.5 mg by nebulization every 4 (four) hours as needed for wheezing or shortness of breath. , Disp: , Rfl:  .  albuterol (VENTOLIN HFA) 108 (90 Base) MCG/ACT inhaler, Inhale 2 puffs into the lungs every 4 (four) hours as needed for wheezing or shortness of breath., Disp: , Rfl:  .  apixaban (ELIQUIS) 5 MG TABS tablet, Take 1 tablet (5 mg total) by mouth 2 (two) times daily., Disp: 60 tablet, Rfl: 2 .  atropine 1 % ophthalmic solution, Place 1 drop into  the right eye in the morning and at bedtime., Disp: , Rfl:  .  brimonidine-timolol (COMBIGAN) 0.2-0.5 % ophthalmic solution, Place 3 drops into both eyes every 12 (twelve) hours., Disp: , Rfl:  .  Brinzolamide-Brimonidine 1-0.2 % SUSP, Apply 2.5 mLs to eye 1 day or 1 dose., Disp: , Rfl:  .  erythromycin ophthalmic ointment, Place 1 application into both eyes at bedtime., Disp: , Rfl:  .  esomeprazole (NEXIUM) 40 MG capsule, Take by mouth., Disp: , Rfl:  .  levothyroxine (SYNTHROID, LEVOTHROID) 88 MCG tablet, Take 75 mcg by mouth daily before breakfast. , Disp: , Rfl:  .  losartan (COZAAR) 50 MG tablet, Take 50 mg by mouth daily. prn, Disp: , Rfl:  .  metFORMIN (GLUCOPHAGE) 500 MG tablet, Take 500 mg by mouth daily. , Disp: , Rfl:  .  mometasone (NASONEX) 50 MCG/ACT nasal spray, Place 2 sprays into the nose daily., Disp: , Rfl:  .  neomycin-bacitracin-polymyxin (NEOSPORIN) ophthalmic ointment, Place  1 application into both eyes at bedtime., Disp: , Rfl:  .  prednisoLONE acetate (PRED FORTE) 1 % ophthalmic suspension, Place 1 drop into both eyes 1 day or 1 dose., Disp: , Rfl:  .  predniSONE (DELTASONE) 10 MG tablet, Take 5 mg by mouth daily with breakfast. , Disp: , Rfl:  .  simvastatin (ZOCOR) 40 MG tablet, Take 40 mg by mouth daily., Disp: , Rfl:  .  SYMBICORT 160-4.5 MCG/ACT inhaler, , Disp: , Rfl:  .  ascorbic acid (VITAMIN C) 100 MG tablet, Take 1,000 mg by mouth daily. (Patient not taking: No sig reported), Disp: , Rfl:  .  buPROPion (WELLBUTRIN SR) 150 MG 12 hr tablet, Take 150 mg by mouth daily. (Patient not taking: No sig reported), Disp: , Rfl:  .  cyclobenzaprine (FLEXERIL) 5 MG tablet, Take 5 mg by mouth 3 (three) times daily as needed for muscle spasms. (Patient not taking: No sig reported), Disp: , Rfl:  .  DULoxetine (CYMBALTA) 20 MG capsule, Take 1 capsule by mouth 1 day or 1 dose. (Patient not taking: No sig reported), Disp: , Rfl:  .  ergocalciferol (VITAMIN D2) 1.25 MG (50000 UT)  capsule, Take 50,000 Units by mouth every Friday.  (Patient not taking: No sig reported), Disp: , Rfl:  .  ferrous sulfate 325 (65 FE) MG tablet, Take by mouth. (Patient not taking: No sig reported), Disp: , Rfl:  .  furosemide (LASIX) 20 MG tablet, Take 1 tablet by mouth daily as needed. (Patient not taking: No sig reported), Disp: , Rfl:   Physical exam:  Vitals:   10/29/20 1023  BP: 140/80  Pulse: 72  Resp: 16  Temp: 98.6 F (37 C)  Weight: 111 lb 3.2 oz (50.4 kg)   Physical Exam Eyes:     Extraocular Movements: EOM normal.  Cardiovascular:     Rate and Rhythm: Normal rate and regular rhythm.     Heart sounds: Normal heart sounds.  Pulmonary:     Effort: Pulmonary effort is normal.     Breath sounds: Normal breath sounds.  Abdominal:     General: Bowel sounds are normal.     Palpations: Abdomen is soft.  Skin:    General: Skin is warm and dry.  Neurological:     Mental Status: She is alert and oriented to person, place, and time.      CMP Latest Ref Rng & Units 08/03/2020  Glucose 70 - 99 mg/dL 89  BUN 8 - 23 mg/dL 19  Creatinine 0.44 - 1.00 mg/dL 1.05(H)  Sodium 135 - 145 mmol/L 143  Potassium 3.5 - 5.1 mmol/L 4.4  Chloride 98 - 111 mmol/L 110  CO2 22 - 32 mmol/L 27  Calcium 8.9 - 10.3 mg/dL 8.8(L)  Total Protein 6.5 - 8.1 g/dL 6.6  Total Bilirubin 0.3 - 1.2 mg/dL 0.5  Alkaline Phos 38 - 126 U/L 55  AST 15 - 41 U/L 14(L)  ALT 0 - 44 U/L 9   CBC Latest Ref Rng & Units 10/29/2020  WBC 4.0 - 10.5 K/uL 5.3  Hemoglobin 12.0 - 15.0 g/dL 8.8(L)  Hematocrit 36.0 - 46.0 % 28.0(L)  Platelets 150 - 400 K/uL 59(L)      Assessment and plan- Patient is a 64 y.o. female with history of antiphospholipid antibody syndrome on Eliquis here for follow-up of following issues:  1.  Antiphospholipid antibody syndrome: Continue Eliquis.  She has failed Coumadin in the past with repeat DVT on Coumadin.  No recent  thromboembolic episodes since starting Eliquis.  2.   Thrombocytopenia:40s to 57s over the last 1 year.  Presently there 41.  Continue to monitor this has been a chronic issue and her platelet counts fluctuate between  3.  Microcytic anemia: Patient's hemoglobin typically remains around 10 but over the last 4 to 5 months it is slowly drifting down with evidence of progressive microcytosis.Her iron studies in September showed a mildly low ferritin level of 33 with a low iron saturation of 7%.  We discussed trying oral iron versus IV iron.  Patient prefers not to come for IV iron at this time and will try to take oral iron once or twice a day as tolerated.  Repeat CBC ferritin and iron studies in 3 months and I will see her at that time for in person or video visit.  We will also check B12 folate and reticulocyte count at that time   Visit Diagnosis 1. Antiphospholipid antibody syndrome (HCC)   2. Iron deficiency anemia, unspecified iron deficiency anemia type   3. Thrombocytopenia ([place])      Dr. Randa Evens, MD, MPH [facility] [phone] 10/29/2020 2:18 PM

## 2020-10-29 NOTE — Progress Notes (Signed)
Patient reports irregular bowel movements.  Also the "off balance" feeling is improving.

## 2021-01-28 ENCOUNTER — Other Ambulatory Visit: Payer: Self-pay

## 2021-01-28 ENCOUNTER — Telehealth: Payer: Self-pay | Admitting: Oncology

## 2021-01-28 ENCOUNTER — Inpatient Hospital Stay: Payer: Medicare HMO | Attending: Oncology

## 2021-01-28 ENCOUNTER — Inpatient Hospital Stay: Payer: Medicare HMO | Admitting: Oncology

## 2021-01-28 DIAGNOSIS — D509 Iron deficiency anemia, unspecified: Secondary | ICD-10-CM | POA: Insufficient documentation

## 2021-01-28 DIAGNOSIS — D6861 Antiphospholipid syndrome: Secondary | ICD-10-CM | POA: Insufficient documentation

## 2021-01-28 DIAGNOSIS — Z86718 Personal history of other venous thrombosis and embolism: Secondary | ICD-10-CM | POA: Insufficient documentation

## 2021-01-28 DIAGNOSIS — Z7901 Long term (current) use of anticoagulants: Secondary | ICD-10-CM | POA: Insufficient documentation

## 2021-01-28 DIAGNOSIS — Z87891 Personal history of nicotine dependence: Secondary | ICD-10-CM | POA: Diagnosis not present

## 2021-01-28 DIAGNOSIS — D696 Thrombocytopenia, unspecified: Secondary | ICD-10-CM | POA: Insufficient documentation

## 2021-01-28 LAB — CBC WITH DIFFERENTIAL/PLATELET
Abs Immature Granulocytes: 0.09 10*3/uL — ABNORMAL HIGH (ref 0.00–0.07)
Basophils Absolute: 0.1 10*3/uL (ref 0.0–0.1)
Basophils Relative: 1 %
Eosinophils Absolute: 0.1 10*3/uL (ref 0.0–0.5)
Eosinophils Relative: 1 %
HCT: 29.8 % — ABNORMAL LOW (ref 36.0–46.0)
Hemoglobin: 9.2 g/dL — ABNORMAL LOW (ref 12.0–15.0)
Immature Granulocytes: 1 %
Lymphocytes Relative: 9 %
Lymphs Abs: 0.8 10*3/uL (ref 0.7–4.0)
MCH: 25.3 pg — ABNORMAL LOW (ref 26.0–34.0)
MCHC: 30.9 g/dL (ref 30.0–36.0)
MCV: 82.1 fL (ref 80.0–100.0)
Monocytes Absolute: 0.4 10*3/uL (ref 0.1–1.0)
Monocytes Relative: 5 %
Neutro Abs: 6.8 10*3/uL (ref 1.7–7.7)
Neutrophils Relative %: 83 %
Platelets: 53 10*3/uL — ABNORMAL LOW (ref 150–400)
RBC: 3.63 MIL/uL — ABNORMAL LOW (ref 3.87–5.11)
RDW: 16.8 % — ABNORMAL HIGH (ref 11.5–15.5)
WBC: 8.2 10*3/uL (ref 4.0–10.5)
nRBC: 0 % (ref 0.0–0.2)

## 2021-01-28 LAB — RETIC PANEL
Immature Retic Fract: 20.9 % — ABNORMAL HIGH (ref 2.3–15.9)
RBC.: 3.62 MIL/uL — ABNORMAL LOW (ref 3.87–5.11)
Retic Count, Absolute: 51.8 10*3/uL (ref 19.0–186.0)
Retic Ct Pct: 1.4 % (ref 0.4–3.1)
Reticulocyte Hemoglobin: 27.3 pg — ABNORMAL LOW (ref >27.9)

## 2021-01-28 LAB — IRON AND TIBC
Iron: 57 ug/dL (ref 28–170)
Saturation Ratios: 12 % (ref 10.4–31.8)
TIBC: 459 ug/dL — ABNORMAL HIGH (ref 250–450)
UIBC: 402 ug/dL

## 2021-01-28 LAB — FOLATE: Folate: 14.7 ng/mL (ref >5.9)

## 2021-01-28 LAB — VITAMIN B12: Vitamin B-12: 193 pg/mL (ref 180–914)

## 2021-01-28 LAB — FERRITIN: Ferritin: 30 ng/mL (ref 11–307)

## 2021-01-28 NOTE — Telephone Encounter (Signed)
Attempt made to reach patient and r/s missed my chart visit. No answer and mailbox full.

## 2021-03-12 ENCOUNTER — Telehealth: Payer: Self-pay | Admitting: *Deleted

## 2021-03-12 NOTE — Telephone Encounter (Signed)
Butch Penny from [facility] called stating that patient is having black stools and is on Eliquis. She is requesting a return call to discuss. Patient was marked a No Show for her March 21 appointment and has no future appts scheduled at this time

## 2021-03-12 NOTE — Telephone Encounter (Signed)
I called [facility] and was giving doctor response when I was told that someone else had already called with this message

## 2021-03-12 NOTE — Telephone Encounter (Signed)
I called the [place] office [phone] and spoke to tiffany and gave her the whole paragraph from Dr. Janese Banks that she said. I also left her with cell phone for Janese Banks if MD has any issues

## 2021-03-12 NOTE — Telephone Encounter (Signed)
Happy to talk to pcp if need be. Her hb is stable between 8-9 since may of last year. Platelets have been low and stable. Would prefer not to hold eliquis at this time. When is GI going to see her? Based on if they decide to do procedure- I will bridge her. Recommend checking weekly cbc until then without holding eliquis

## 2021-03-12 NOTE — Telephone Encounter (Signed)
When I called Butch Penny back, she wanted to know about holding the Eliquis in light of patient antiphospholipid syndrome and risk of DVT. She said patient has been referred to GI for her dark stools already. She is going to fax the note form physician to [place] right now

## 2021-03-12 NOTE — Telephone Encounter (Signed)
Ideally needs to go to ER. Otherwise seen at [facility]. She should come for labs today or tomorrow- cbc ferritin and iron studies

## 2021-03-12 NOTE — Telephone Encounter (Signed)
I would wait for her hb to be back before holding eliquis. She is high risk for clotting

## 2021-03-13 ENCOUNTER — Other Ambulatory Visit: Payer: Self-pay | Admitting: Specialist

## 2021-03-13 DIAGNOSIS — R0609 Other forms of dyspnea: Secondary | ICD-10-CM

## 2021-03-13 DIAGNOSIS — R0600 Dyspnea, unspecified: Secondary | ICD-10-CM

## 2021-03-13 DIAGNOSIS — J439 Emphysema, unspecified: Secondary | ICD-10-CM

## 2021-03-13 DIAGNOSIS — J849 Interstitial pulmonary disease, unspecified: Secondary | ICD-10-CM

## 2021-04-15 ENCOUNTER — Other Ambulatory Visit: Payer: Self-pay | Admitting: Specialist

## 2021-04-15 DIAGNOSIS — R0609 Other forms of dyspnea: Secondary | ICD-10-CM

## 2021-04-15 DIAGNOSIS — J439 Emphysema, unspecified: Secondary | ICD-10-CM

## 2021-04-15 DIAGNOSIS — R0600 Dyspnea, unspecified: Secondary | ICD-10-CM

## 2021-04-17 ENCOUNTER — Other Ambulatory Visit: Payer: Medicare HMO

## 2021-04-17 ENCOUNTER — Ambulatory Visit
Admission: RE | Admit: 2021-04-17 | Discharge: 2021-04-17 | Disposition: A | Discharge status: Home / Self Care | Payer: Medicare HMO | Source: Ambulatory Visit | Attending: Specialist | Admitting: Specialist

## 2021-04-17 ENCOUNTER — Other Ambulatory Visit: Payer: Self-pay

## 2021-04-17 DIAGNOSIS — R0609 Other forms of dyspnea: Secondary | ICD-10-CM

## 2021-04-17 DIAGNOSIS — J439 Emphysema, unspecified: Secondary | ICD-10-CM | POA: Diagnosis not present

## 2021-04-17 DIAGNOSIS — R0600 Dyspnea, unspecified: Secondary | ICD-10-CM | POA: Diagnosis present

## 2021-06-10 ENCOUNTER — Encounter: Payer: Self-pay | Admitting: *Deleted

## 2021-06-11 ENCOUNTER — Ambulatory Visit
Admission: RE | Admit: 2021-06-11 | Discharge: 2021-06-11 | Disposition: A | Discharge status: Home / Self Care | Payer: Medicare HMO | Attending: Gastroenterology | Admitting: Gastroenterology

## 2021-06-11 ENCOUNTER — Ambulatory Visit: Payer: Medicare HMO | Admitting: Certified Registered"

## 2021-06-11 ENCOUNTER — Encounter: Admission: RE | Payer: Self-pay | Source: Home / Self Care

## 2021-06-11 ENCOUNTER — Ambulatory Visit: Admission: RE | Admit: 2021-06-11 | Payer: Medicare HMO | Source: Home / Self Care

## 2021-06-11 ENCOUNTER — Encounter
Admission: RE | Disposition: A | Discharge status: Home / Self Care | Payer: Self-pay | Source: Home / Self Care | Attending: Gastroenterology

## 2021-06-11 ENCOUNTER — Encounter: Payer: Self-pay | Admitting: *Deleted

## 2021-06-11 DIAGNOSIS — D125 Benign neoplasm of sigmoid colon: Secondary | ICD-10-CM | POA: Insufficient documentation

## 2021-06-11 DIAGNOSIS — K921 Melena: Secondary | ICD-10-CM | POA: Diagnosis present

## 2021-06-11 DIAGNOSIS — Z886 Allergy status to analgesic agent status: Secondary | ICD-10-CM | POA: Insufficient documentation

## 2021-06-11 DIAGNOSIS — K5520 Angiodysplasia of colon without hemorrhage: Secondary | ICD-10-CM | POA: Diagnosis not present

## 2021-06-11 DIAGNOSIS — K5730 Diverticulosis of large intestine without perforation or abscess without bleeding: Secondary | ICD-10-CM | POA: Insufficient documentation

## 2021-06-11 DIAGNOSIS — D649 Anemia, unspecified: Secondary | ICD-10-CM | POA: Insufficient documentation

## 2021-06-11 DIAGNOSIS — E039 Hypothyroidism, unspecified: Secondary | ICD-10-CM | POA: Diagnosis not present

## 2021-06-11 DIAGNOSIS — Z7984 Long term (current) use of oral hypoglycemic drugs: Secondary | ICD-10-CM | POA: Diagnosis not present

## 2021-06-11 DIAGNOSIS — K2970 Gastritis, unspecified, without bleeding: Secondary | ICD-10-CM | POA: Insufficient documentation

## 2021-06-11 DIAGNOSIS — Z79899 Other long term (current) drug therapy: Secondary | ICD-10-CM | POA: Diagnosis not present

## 2021-06-11 DIAGNOSIS — Z7901 Long term (current) use of anticoagulants: Secondary | ICD-10-CM | POA: Diagnosis not present

## 2021-06-11 DIAGNOSIS — E0500 Thyrotoxicosis with diffuse goiter without thyrotoxic crisis or storm: Secondary | ICD-10-CM | POA: Insufficient documentation

## 2021-06-11 DIAGNOSIS — D124 Benign neoplasm of descending colon: Secondary | ICD-10-CM | POA: Diagnosis not present

## 2021-06-11 DIAGNOSIS — Z79890 Hormone replacement therapy (postmenopausal): Secondary | ICD-10-CM | POA: Diagnosis not present

## 2021-06-11 DIAGNOSIS — K6389 Other specified diseases of intestine: Secondary | ICD-10-CM | POA: Diagnosis not present

## 2021-06-11 DIAGNOSIS — Z885 Allergy status to narcotic agent status: Secondary | ICD-10-CM | POA: Insufficient documentation

## 2021-06-11 DIAGNOSIS — D122 Benign neoplasm of ascending colon: Secondary | ICD-10-CM | POA: Diagnosis not present

## 2021-06-11 DIAGNOSIS — Z7952 Long term (current) use of systemic steroids: Secondary | ICD-10-CM | POA: Diagnosis not present

## 2021-06-11 DIAGNOSIS — K2100 Gastro-esophageal reflux disease with esophagitis, without bleeding: Secondary | ICD-10-CM | POA: Diagnosis not present

## 2021-06-11 DIAGNOSIS — Z7951 Long term (current) use of inhaled steroids: Secondary | ICD-10-CM | POA: Diagnosis not present

## 2021-06-11 DIAGNOSIS — K640 First degree hemorrhoids: Secondary | ICD-10-CM | POA: Insufficient documentation

## 2021-06-11 DIAGNOSIS — D123 Benign neoplasm of transverse colon: Secondary | ICD-10-CM | POA: Insufficient documentation

## 2021-06-11 DIAGNOSIS — Z888 Allergy status to other drugs, medicaments and biological substances status: Secondary | ICD-10-CM | POA: Insufficient documentation

## 2021-06-11 DIAGNOSIS — Z800 Family history of malignant neoplasm of digestive organs: Secondary | ICD-10-CM | POA: Insufficient documentation

## 2021-06-11 HISTORY — PX: COLONOSCOPY WITH PROPOFOL: SHX5780

## 2021-06-11 HISTORY — DX: Other intervertebral disc degeneration, lumbar region without mention of lumbar back pain or lower extremity pain: M51.369

## 2021-06-11 HISTORY — DX: Hypothyroidism, unspecified: E03.9

## 2021-06-11 HISTORY — PX: ESOPHAGOGASTRODUODENOSCOPY: SHX5428

## 2021-06-11 HISTORY — DX: Anxiety disorder, unspecified: F41.9

## 2021-06-11 HISTORY — DX: Thyrotoxicosis with diffuse goiter without thyrotoxic crisis or storm: E05.00

## 2021-06-11 HISTORY — DX: Other intervertebral disc degeneration, lumbar region: M51.36

## 2021-06-11 HISTORY — DX: Hyperlipidemia, unspecified: E78.5

## 2021-06-11 HISTORY — DX: Gastro-esophageal reflux disease without esophagitis: K21.9

## 2021-06-11 LAB — GLUCOSE, CAPILLARY: Glucose-Capillary: 62 mg/dL — ABNORMAL LOW (ref 70–99)

## 2021-06-11 SURGERY — COLONOSCOPY WITH PROPOFOL
Anesthesia: General

## 2021-06-11 MED ORDER — ESMOLOL HCL 100 MG/10ML IV SOLN
INTRAVENOUS | Status: DC | PRN
  Administered 2021-06-11: 20 mg via INTRAVENOUS

## 2021-06-11 MED ORDER — GLYCOPYRROLATE 0.2 MG/ML IJ SOLN
INTRAMUSCULAR | Status: DC | PRN
  Administered 2021-06-11: .2 mg via INTRAVENOUS

## 2021-06-11 MED ORDER — LIDOCAINE HCL (CARDIAC) PF 100 MG/5ML IV SOSY
PREFILLED_SYRINGE | INTRAVENOUS | Status: DC | PRN
  Administered 2021-06-11: 100 mg via INTRAVENOUS

## 2021-06-11 MED ORDER — PROPOFOL 10 MG/ML IV BOLUS
INTRAVENOUS | Status: DC | PRN
  Administered 2021-06-11: 40 mg via INTRAVENOUS
  Administered 2021-06-11: 20 mg via INTRAVENOUS

## 2021-06-11 MED ORDER — MIDAZOLAM HCL 2 MG/2ML IJ SOLN
INTRAMUSCULAR | Status: DC | PRN
  Administered 2021-06-11: 2 mg via INTRAVENOUS

## 2021-06-11 MED ORDER — PROPOFOL 500 MG/50ML IV EMUL
INTRAVENOUS | Status: DC | PRN
  Administered 2021-06-11: 125 ug/kg/min via INTRAVENOUS

## 2021-06-11 MED ORDER — SODIUM CHLORIDE 0.9 % IV SOLN
INTRAVENOUS | Status: DC
  Administered 2021-06-11: 1000 mL via INTRAVENOUS

## 2021-06-11 NOTE — Transfer of Care (Signed)
Immediate Anesthesia Transfer of Care Note  Patient: Alison Lopez  Procedure(s) Performed: COLONOSCOPY WITH PROPOFOL ESOPHAGOGASTRODUODENOSCOPY (EGD)  Patient Location: Endoscopy Unit  Anesthesia Type:General  Level of Consciousness: drowsy and patient cooperative  Airway & Oxygen Therapy: Patient Spontanous Breathing and Patient connected to face mask oxygen  Post-op Assessment: Report given to RN and Post -op Vital signs reviewed and stable  Post vital signs: Reviewed and stable  Last Vitals:  Vitals Value Taken Time  BP 107/73 06/11/21 1424  Temp 36.2 C 06/11/21 1422  Pulse 84 06/11/21 1425  Resp 11 06/11/21 1425  SpO2 100 % 06/11/21 1425  Vitals shown include unvalidated device data.  Last Pain:  Vitals:   06/11/21 1422  TempSrc: Temporal  PainSc: Asleep      Patients Stated Pain Goal: 0 ([id number])  Complications: No notable events documented.

## 2021-06-11 NOTE — Anesthesia Postprocedure Evaluation (Signed)
Anesthesia Post Note  Patient: VISTA LEAZER  Procedure(s) Performed: COLONOSCOPY WITH PROPOFOL ESOPHAGOGASTRODUODENOSCOPY (EGD)  Patient location during evaluation: Endoscopy Anesthesia Type: General Level of consciousness: awake and alert Pain management: pain level controlled Vital Signs Assessment: post-procedure vital signs reviewed and stable Respiratory status: spontaneous breathing, nonlabored ventilation, respiratory function stable and patient connected to nasal cannula oxygen Cardiovascular status: blood pressure returned to baseline and stable Postop Assessment: no apparent nausea or vomiting Anesthetic complications: no   No notable events documented.   Last Vitals:  Vitals:   06/11/21 1422 06/11/21 1432  BP: 107/73 117/89  Pulse:  98  Resp:    Temp: (!) 36.2 C   SpO2:  98%    Last Pain:  Vitals:   06/11/21 1432  TempSrc:   PainSc: 0-No pain                 Arita Miss

## 2021-06-11 NOTE — Anesthesia Preprocedure Evaluation (Signed)
Anesthesia Evaluation  Patient identified by MRN, date of birth, ID band Patient awake    Reviewed: Allergy & Precautions, NPO status , Patient's Chart, lab work & pertinent test results  History of Anesthesia Complications Negative for: history of anesthetic complications  Airway Mallampati: II  TM Distance: >3 FB Neck ROM: Full    Dental  (+) Poor Dentition, Missing, Chipped   Pulmonary neg sleep apnea, COPD,  COPD inhaler, Patient abstained from smoking.Not current smoker, former smoker,    Pulmonary exam normal breath sounds clear to auscultation       Cardiovascular Exercise Tolerance: Poor METShypertension, (-) CAD and (-) Past MI (-) dysrhythmias  Rhythm:Regular Rate:Normal - Systolic murmurs    Neuro/Psych PSYCHIATRIC DISORDERS Anxiety Depression  Neuromuscular disease    GI/Hepatic GERD  ,(+)     (-) substance abuse  ,   Endo/Other  diabetesHypothyroidism   Renal/GU negative Renal ROS     Musculoskeletal  (+) Arthritis , Rheumatoid disorders,  Fibromyalgia -On chronic glucocorticoids    Abdominal   Peds  Hematology   Anesthesia Other Findings Past Medical History: No date: Anxiety No date: Arthritis No date: Collagen vascular disease (HCC) No date: COPD (chronic obstructive pulmonary disease) (HCC) No date: DDD (degenerative disc disease), lumbar No date: Depression No date: Diabetes mellitus without complication (HCC) No date: DVT (deep venous thrombosis) (HCC) No date: Fibromyalgia No date: GERD (gastroesophageal reflux disease) No date: Graves' disease with exophthalmos No date: HLD (hyperlipidemia) No date: Hypertension No date: Hypothyroidism No date: Lupus (HCC) No date: Osteoporosis  Reproductive/Obstetrics                            Anesthesia Physical Anesthesia Plan  ASA: 3  Anesthesia Plan: General   Post-op Pain Management:    Induction:  Intravenous  PONV Risk Score and Plan: 3 and Ondansetron, Propofol infusion and TIVA  Airway Management Planned: Nasal Cannula  Additional Equipment: None  Intra-op Plan:   Post-operative Plan:   Informed Consent: I have reviewed the patients History and Physical, chart, labs and discussed the procedure including the risks, benefits and alternatives for the proposed anesthesia with the patient or authorized representative who has indicated his/her understanding and acceptance.     Dental advisory given  Plan Discussed with: CRNA and Surgeon  Anesthesia Plan Comments: (Discussed risks of anesthesia with patient, including possibility of difficulty with spontaneous ventilation under anesthesia necessitating airway intervention, PONV, and rare risks such as cardiac or respiratory or neurological events, and allergic reactions. Patient understands.)        Anesthesia Quick Evaluation

## 2021-06-11 NOTE — Interval H&P Note (Signed)
History and Physical Interval Note:  06/11/2021 1:16 PM  Alison Lopez  has presented today for surgery, with the diagnosis of melena fam hx.  The various methods of treatment have been discussed with the patient and family. After consideration of risks, benefits and other options for treatment, the patient has consented to  Procedure(s) with comments: COLONOSCOPY WITH PROPOFOL (N/A) - prefers afternoon Eliquis ESOPHAGOGASTRODUODENOSCOPY (EGD) (N/A) as a surgical intervention.  The patient's history has been reviewed, patient examined, no change in status, stable for surgery.  I have reviewed the patient's chart and labs.  Questions were answered to the patient's satisfaction.     Lesly Rubenstein  Ok to proceed with EGD/Colonoscopy

## 2021-06-11 NOTE — Anesthesia Procedure Notes (Signed)
Procedure Name: General with mask airway Date/Time: 06/11/2021 1:54 PM Performed by: Kelton Pillar, CRNA Pre-anesthesia Checklist: Patient identified, Emergency Drugs available, Suction available and Patient being monitored Patient Re-evaluated:Patient Re-evaluated prior to induction Oxygen Delivery Method: Simple face mask Induction Type: IV induction Placement Confirmation: positive ETCO2 and CO2 detector Dental Injury: Teeth and Oropharynx as per pre-operative assessment

## 2021-06-11 NOTE — Op Note (Signed)
[facility] Patient Name: Alison Lopez Procedure Date: 06/11/2021 1:33 PM MRN: [id number] Account #: [id number] Date of Birth: 1956/01/16 Admit Type: Outpatient Age: 65 Room: [facility] ENDO ROOM 1 Gender: Female Note Status: Finalized Procedure:             Upper GI endoscopy Indications:           Melena Providers:             Andrey Farmer MD, MD Referring MD:          Rubbie Battiest. Iona Beard MD, MD (Referring MD) Medicines:             Monitored Anesthesia Care Complications:         No immediate complications. Estimated blood loss:                         Minimal. Procedure:             Pre-Anesthesia Assessment:                        - Prior to the procedure, a History and Physical was                         performed, and patient medications and allergies were                         reviewed. The patient is competent. The risks and                         benefits of the procedure and the sedation options and                         risks were discussed with the patient. All questions                         were answered and informed consent was obtained.                         Patient identification and proposed procedure were                         verified by the physician, the nurse, the anesthetist                         and the technician in the endoscopy suite. Mental                         Status Examination: alert and oriented. Airway                         Examination: normal oropharyngeal airway and neck                         mobility. Respiratory Examination: clear to                         auscultation. CV Examination: normal. Prophylactic  Antibiotics: The patient does not require prophylactic                         antibiotics. Prior Anticoagulants: The patient has                         taken Eliquis (apixaban), last dose was 2 days prior                         to procedure. ASA Grade Assessment:  III - A patient                         with severe systemic disease. After reviewing the                         risks and benefits, the patient was deemed in                         satisfactory condition to undergo the procedure. The                         anesthesia plan was to use monitored anesthesia care                         (MAC). Immediately prior to administration of                         medications, the patient was re-assessed for adequacy                         to receive sedatives. The heart rate, respiratory                         rate, oxygen saturations, blood pressure, adequacy of                         pulmonary ventilation, and response to care were                         monitored throughout the procedure. The physical                         status of the patient was re-assessed after the                         procedure.                        After obtaining informed consent, the endoscope was                         passed under direct vision. Throughout the procedure,                         the patient's blood pressure, pulse, and oxygen                         saturations were monitored continuously. The Endoscope  was introduced through the mouth, and advanced to the                         second part of duodenum. The upper GI endoscopy was                         accomplished without difficulty. The patient tolerated                         the procedure well. Findings:      LA Grade A (one or more mucosal breaks less than 5 mm, not extending       between tops of 2 mucosal folds) esophagitis with no bleeding was found.      Patchy mild inflammation characterized by erythema was found in the       gastric antrum. Biopsies were taken with a cold forceps for Helicobacter       pylori testing. Estimated blood loss was minimal.      The examined duodenum was normal. Impression:            - LA Grade A reflux esophagitis with no  bleeding.                        - Gastritis. Biopsied.                        - Normal examined duodenum. Recommendation:        - Perform a colonoscopy today. Procedure Code(s):     --- Professional ---                        [phone], Esophagogastroduodenoscopy, flexible,                         transoral; with biopsy, single or multiple Diagnosis Code(s):     --- Professional ---                        K21.00, Gastro-esophageal reflux disease with                         esophagitis, without bleeding                        K29.70, Gastritis, unspecified, without bleeding                        K92.1, Melena (includes Hematochezia) CPT copyright 2019 American Medical Association. All rights reserved. The codes documented in this report are preliminary and upon coder review may  be revised to meet current compliance requirements. Andrey Farmer MD, MD 06/11/2021 2:22:34 PM Number of Addenda: 0 Note Initiated On: 06/11/2021 1:33 PM Estimated Blood Loss:  Estimated blood loss was minimal.      [facility]

## 2021-06-11 NOTE — Op Note (Signed)
[facility] Patient Name: Alison Lopez Procedure Date: 06/11/2021 1:31 PM MRN: [id number] Account #: [id number] Date of Birth: [birth year] Admit Type: Outpatient Age: 65 Room: [facility] ENDO ROOM 1 Gender: Female Note Status: Finalized Procedure:             Colonoscopy Indications:           Melena Providers:             Andrey Farmer MD, MD Referring MD:          Rubbie Battiest. Iona Beard MD, MD (Referring MD) Medicines:             Monitored Anesthesia Care Complications:         No immediate complications. Estimated blood loss:                         Minimal. Procedure:             Pre-Anesthesia Assessment:                        - Prior to the procedure, a History and Physical was                         performed, and patient medications and allergies were                         reviewed. The patient is competent. The risks and                         benefits of the procedure and the sedation options and                         risks were discussed with the patient. All questions                         were answered and informed consent was obtained.                         Patient identification and proposed procedure were                         verified by the physician, the nurse, the anesthetist                         and the technician in the endoscopy suite. Mental                         Status Examination: alert and oriented. Airway                         Examination: normal oropharyngeal airway and neck                         mobility. Respiratory Examination: clear to                         auscultation. CV Examination: normal. Prophylactic                         Antibiotics: The  patient does not require prophylactic                         antibiotics. Prior Anticoagulants: The patient has                         taken Eliquis (apixaban), last dose was 2 days prior                         to procedure. ASA Grade Assessment: III -  A patient                         with severe systemic disease. After reviewing the                         risks and benefits, the patient was deemed in                         satisfactory condition to undergo the procedure. The                         anesthesia plan was to use monitored anesthesia care                         (MAC). Immediately prior to administration of                         medications, the patient was re-assessed for adequacy                         to receive sedatives. The heart rate, respiratory                         rate, oxygen saturations, blood pressure, adequacy of                         pulmonary ventilation, and response to care were                         monitored throughout the procedure. The physical                         status of the patient was re-assessed after the                         procedure.                        After obtaining informed consent, the colonoscope was                         passed under direct vision. Throughout the procedure,                         the patient's blood pressure, pulse, and oxygen                         saturations were monitored continuously. The  Colonoscope was introduced through the anus and                         advanced to the the cecum, identified by appendiceal                         orifice and ileocecal valve. The colonoscopy was                         performed without difficulty. The patient tolerated                         the procedure well. The quality of the bowel                         preparation was good. Findings:      The perianal and digital rectal examinations were normal.      Exudate/Mucous was found at the appendiceal orifice. Biopsies were taken       with a cold forceps for histology. Estimated blood loss was minimal.      A single medium-sized angioectasia without bleeding was found in the       ascending colon.      Multiple small and  large-mouthed diverticula were found in the sigmoid       colon, descending colon and ascending colon.      A 4 mm polyp was found in the ascending colon. The polyp was       semi-pedunculated. The polyp was removed with a cold snare. Resection       and retrieval were complete. Estimated blood loss was minimal.      Five sessile polyps were found in the transverse colon. The polyps were       3 to 5 mm in size. These polyps were removed with a cold snare.       Resection and retrieval were complete. Estimated blood loss was minimal.      Two sessile polyps were found in the descending colon. The polyps were 4       to 5 mm in size. These polyps were removed with a cold snare. Resection       and retrieval were complete. Estimated blood loss was minimal.      A 7 mm polyp was found in the sigmoid colon. The polyp was       semi-pedunculated. The polyp was removed with a cold snare. Resection       and retrieval were complete. Estimated blood loss was minimal.      Internal hemorrhoids were found during retroflexion. The hemorrhoids       were Grade I (internal hemorrhoids that do not prolapse). Impression:            - Exudate at the appendiceal orifice. Biopsied.                        - A single non-bleeding colonic angioectasia.                        - Diverticulosis in the sigmoid colon, in the                         descending colon and in the ascending colon.                        -  One 4 mm polyp in the ascending colon, removed with                         a cold snare. Resected and retrieved.                        - Five 3 to 5 mm polyps in the transverse colon,                         removed with a cold snare. Resected and retrieved.                        - Two 4 to 5 mm polyps in the descending colon,                         removed with a cold snare. Resected and retrieved.                        - One 7 mm polyp in the sigmoid colon, removed with a                          cold snare. Resected and retrieved.                        - Internal hemorrhoids. Recommendation:        - Repeat colonoscopy in 1 year for surveillance.                        - Return to referring physician as previously                         scheduled.                        - Discharge patient to home.                        - Resume previous diet.                        - Resume Eliquis (apixaban) at prior dose tomorrow.                        - Await pathology results.                        - Perform CT scan (computed tomography) of the abdomen                         with contrast to evaluate for potential appendiceal                         mucocele Procedure Code(s):     --- Professional ---                        [phone], Colonoscopy, flexible; with removal of                         tumor(s), polyp(s), or  other lesion(s) by snare                         technique                        45380, 59, Colonoscopy, flexible; with biopsy, single                         or multiple Diagnosis Code(s):     --- Professional ---                        K63.89, Other specified diseases of intestine                        K55.20, Angiodysplasia of colon without hemorrhage                        K64.0, First degree hemorrhoids                        K63.5, Polyp of colon                        K92.1, Melena (includes Hematochezia)                        K57.30, Diverticulosis of large intestine without                         perforation or abscess without bleeding CPT copyright 2019 [employer]. All rights reserved. The codes documented in this report are preliminary and upon coder review may  be revised to meet current compliance requirements. Andrey Farmer MD, MD 06/11/2021 2:30:59 PM Number of Addenda: 0 Note Initiated On: 06/11/2021 1:31 PM Scope Withdrawal Time: 0 hours 20 minutes 37 seconds  Total Procedure Duration: 0 hours 30 minutes 3 seconds  Estimated Blood  Loss:  Estimated blood loss was minimal.      [facility]

## 2021-06-11 NOTE — H&P (Signed)
Outpatient short stay form Pre-procedure 06/11/2021 1:12 PM Raylene Miyamoto MD, MPH  Primary Physician: Dr. Iona Beard  Reason for visit:  Melena  History of present illness:   65 y/o lady with history of anti-phospholipid syndrome, graves disease, hypothyroidism, and RA here for EGD/Colonoscopy for report of melena and anemia. Mother with colon cancer in her 26s. Has chronic thrombocytopenia in the 60's. She takes eliquis with last dose 1.5 days ago. Per kidney functions, she should have held her NOAC for 3 days. Explained risks of bleeding to patient and noted we could not remove large polyps but could remove small lesions and do biopsies. Had negative cologuard 1 year ago.   No current facility-administered medications for this encounter.  Medications Prior to Admission  Medication Sig Dispense Refill Last Dose   acetaminophen (TYLENOL) 650 MG CR tablet Take 650 mg by mouth every 8 (eight) hours as needed for pain.   Past Week   albuterol (PROVENTIL) (2.5 MG/3ML) 0.083% nebulizer solution Take 2.5 mg by nebulization every 4 (four) hours as needed for wheezing or shortness of breath.    Past Week   albuterol (VENTOLIN HFA) 108 (90 Base) MCG/ACT inhaler Inhale 2 puffs into the lungs every 4 (four) hours as needed for wheezing or shortness of breath.   Past Week   apixaban (ELIQUIS) 5 MG TABS tablet Take 1 tablet (5 mg total) by mouth 2 (two) times daily. 60 tablet 2 06/10/2021   ascorbic acid (VITAMIN C) 100 MG tablet Take 1,000 mg by mouth daily.   Past Week   atropine 1 % ophthalmic solution Place 1 drop into the right eye in the morning and at bedtime.   Past Week   Brinzolamide-Brimonidine 1-0.2 % SUSP Apply 2.5 mLs to eye 1 day or 1 dose.   Past Week   buPROPion (WELLBUTRIN SR) 150 MG 12 hr tablet Take 150 mg by mouth daily.   Past Week   cyclobenzaprine (FLEXERIL) 5 MG tablet Take 5 mg by mouth 3 (three) times daily as needed for muscle spasms.   Past Week   DULoxetine (CYMBALTA) 20 MG  capsule Take 1 capsule by mouth 1 day or 1 dose.   Past Week   Efinaconazole (JUBLIA) 10 % SOLN Apply topically daily.   Past Week   ergocalciferol (VITAMIN D2) 1.25 MG (50000 UT) capsule Take 50,000 Units by mouth every Friday.   Past Week   erythromycin ophthalmic ointment Place 1 application into both eyes at bedtime.   Past Week   esomeprazole (NEXIUM) 40 MG capsule Take by mouth.   Past Week   furosemide (LASIX) 20 MG tablet Take 1 tablet by mouth daily as needed.   Past Week   levothyroxine (SYNTHROID, LEVOTHROID) 88 MCG tablet Take 75 mcg by mouth daily before breakfast.    Past Week   losartan (COZAAR) 50 MG tablet Take 50 mg by mouth daily. prn   Past Week   metFORMIN (GLUCOPHAGE) 500 MG tablet Take 500 mg by mouth daily.    Past Week   mometasone (NASONEX) 50 MCG/ACT nasal spray Place 2 sprays into the nose daily.   Past Week   neomycin-bacitracin-polymyxin (NEOSPORIN) ophthalmic ointment Place 1 application into both eyes at bedtime.   Past Week   prednisoLONE acetate (PRED FORTE) 1 % ophthalmic suspension Place 1 drop into both eyes 1 day or 1 dose.   Past Week   predniSONE (DELTASONE) 10 MG tablet Take 5 mg by mouth daily with breakfast.    Past  Week   simvastatin (ZOCOR) 40 MG tablet Take 40 mg by mouth daily.   Past Week   SYMBICORT 160-4.5 MCG/ACT inhaler    Past Week   brimonidine-timolol (COMBIGAN) 0.2-0.5 % ophthalmic solution Place 3 drops into both eyes every 12 (twelve) hours.      ferrous sulfate 325 (65 FE) MG tablet Take by mouth. (Patient not taking: No sig reported)        Allergies  Allergen Reactions   Morphine And Related Anaphylaxis   Montelukast     Other reaction(s): Other (See Comments) Nightmares   Antihistamines, Diphenhydramine-Type Nausea Only   Aspirin Other (See Comments)    Patient on blood thinners and was told not to take   Benadryl [Diphenhydramine Hcl] Hives   Codeine Other (See Comments)    AMS     Past Medical History:  Diagnosis Date    Anxiety    Arthritis    Collagen vascular disease ([place])    COPD (chronic obstructive pulmonary disease) ([place])    DDD (degenerative disc disease), lumbar    Depression    Diabetes mellitus without complication ([place])    DVT (deep venous thrombosis) ([facility])    Fibromyalgia    GERD (gastroesophageal reflux disease)    Graves' disease with exophthalmos    HLD (hyperlipidemia)    Hypertension    Hypothyroidism    Lupus ([place])    Osteoporosis     Review of systems:  Otherwise negative.    Physical Exam  Gen: Alert, oriented. Appears stated age.  HEENT: PERRLA. Lungs: No respiratory distress CV: RRR Abd: soft, benign, no masses Ext: No edema    Planned procedures: Proceed with EGD/colonoscopy. The patient understands the nature of the planned procedure, indications, risks, alternatives and potential complications including but not limited to bleeding, infection, perforation, damage to internal organs and possible oversedation/side effects from anesthesia. The patient agrees and gives consent to proceed.  Please refer to procedure notes for findings, recommendations and patient disposition/instructions.     Raylene Miyamoto MD, MPH Gastroenterology 06/11/2021  1:12 PM

## 2021-06-12 ENCOUNTER — Encounter: Payer: Self-pay | Admitting: Gastroenterology

## 2021-06-14 LAB — SURGICAL PATHOLOGY

## 2021-06-18 ENCOUNTER — Other Ambulatory Visit (HOSPITAL_COMMUNITY): Payer: Self-pay | Admitting: Gastroenterology

## 2021-06-18 ENCOUNTER — Other Ambulatory Visit: Payer: Self-pay | Admitting: Gastroenterology

## 2021-06-18 DIAGNOSIS — R634 Abnormal weight loss: Secondary | ICD-10-CM

## 2021-06-18 DIAGNOSIS — K388 Other specified diseases of appendix: Secondary | ICD-10-CM

## 2021-07-01 ENCOUNTER — Ambulatory Visit
Admission: RE | Admit: 2021-07-01 | Discharge: 2021-07-01 | Disposition: A | Discharge status: Home / Self Care | Payer: Medicare HMO | Source: Ambulatory Visit | Attending: Gastroenterology | Admitting: Gastroenterology

## 2021-07-01 ENCOUNTER — Other Ambulatory Visit: Payer: Self-pay

## 2021-07-01 DIAGNOSIS — K388 Other specified diseases of appendix: Secondary | ICD-10-CM | POA: Diagnosis not present

## 2021-07-01 DIAGNOSIS — R634 Abnormal weight loss: Secondary | ICD-10-CM | POA: Diagnosis present

## 2021-07-01 LAB — POCT I-STAT CREATININE: Creatinine, Ser: 1 mg/dL (ref 0.44–1.00)

## 2021-07-01 MED ORDER — IOHEXOL 350 MG/ML SOLN
50.0000 mL | Freq: Once | INTRAVENOUS | Status: AC | PRN
  Administered 2021-07-01: 50 mL via INTRAVENOUS

## 2022-07-28 IMAGING — CT CT ABD-PELV W/ CM
2 of 5 series · 14 of 46 positions shown, 16 images · IV contrast (omnipaque)
Comparison: 04/20/2020 and chest CT 04/17/2021

CLINICAL DATA: Loss of weight. Recent colonoscopy and endoscopy.
Exudate at the appendiceal orifice. CT ordered to evaluate for an
appendiceal mucocele.

EXAM:
CT ABDOMEN AND PELVIS WITH CONTRAST
TECHNIQUE: Multidetector CT imaging of the abdomen and pelvis was performed
using the standard protocol following bolus administration of
intravenous contrast.
CONTRAST:  50mL OMNIPAQUE IOHEXOL 350 MG/ML SOLN

[Series 2: abd pelvis 5.00 · axial · 0.61mm/px · z∈[-1514,-1129]mm · 11 of 87 slices shown, 13 images]
[im 5/87  soft-tissue]
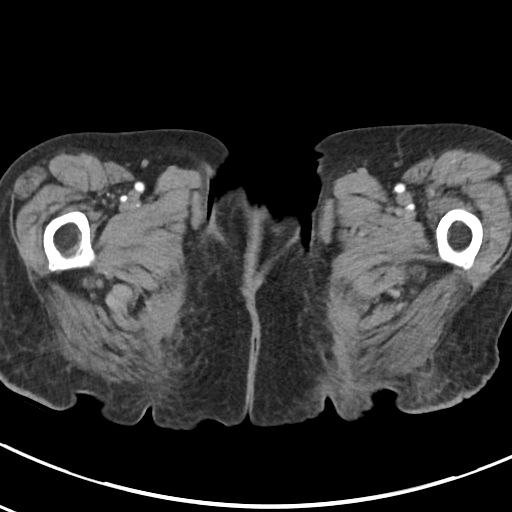
[im 5/87  bone]
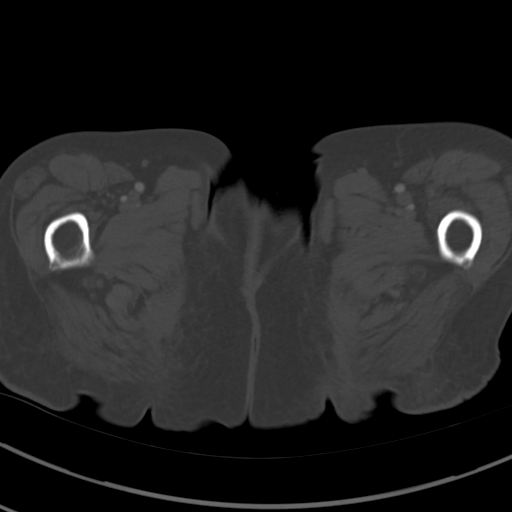
[im 15/87  soft-tissue]
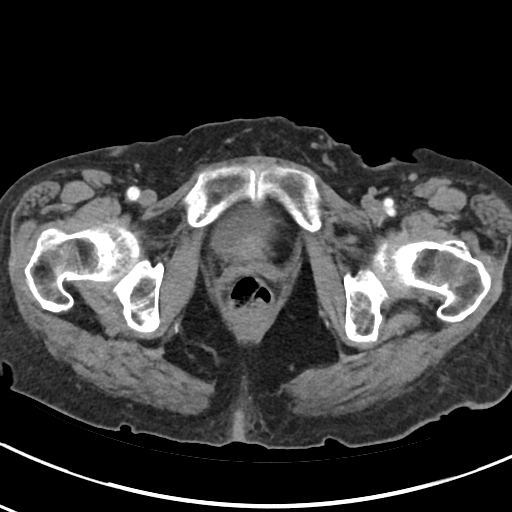
[im 20/87  soft-tissue]
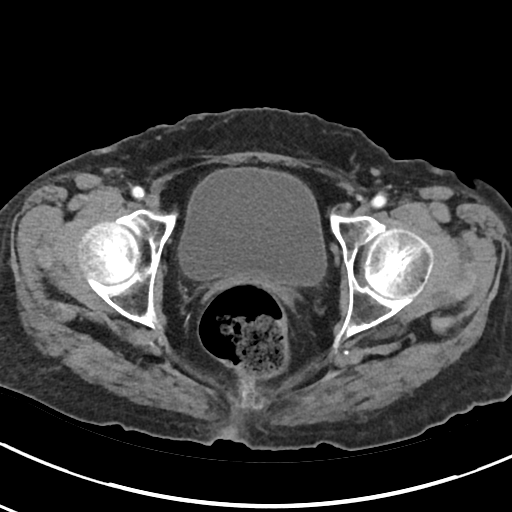
[im 29/87  soft-tissue]
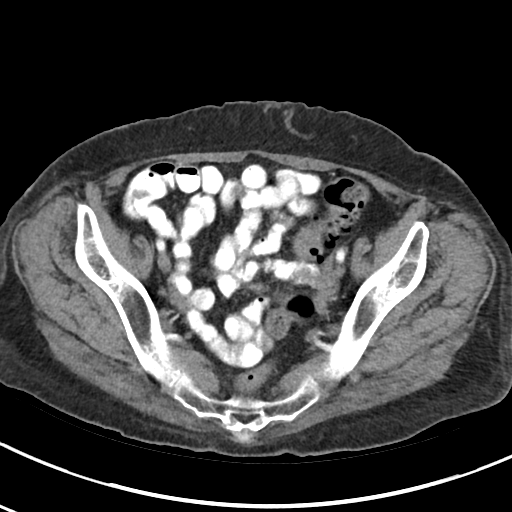
[im 34/87  soft-tissue]
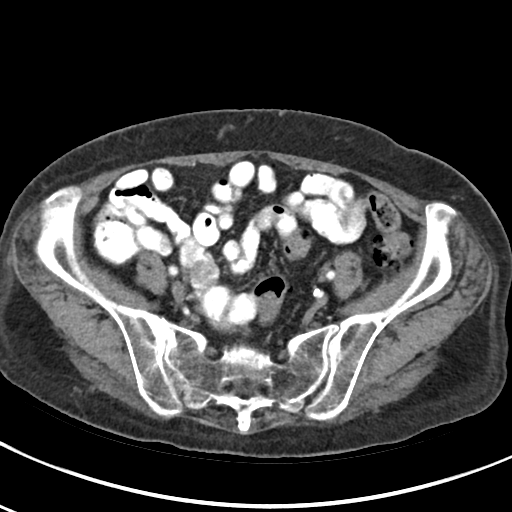
[im 44/87  soft-tissue]
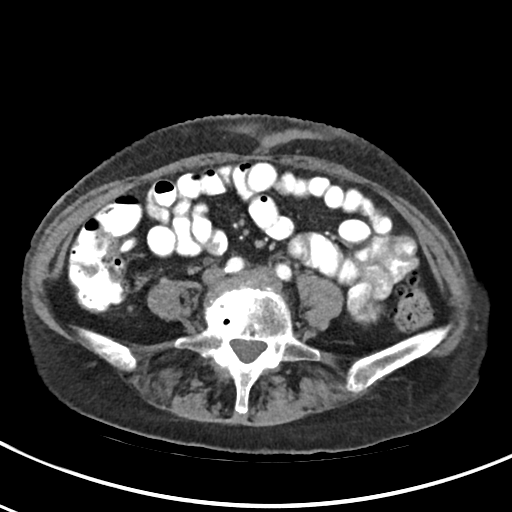
[im 53/87  soft-tissue]
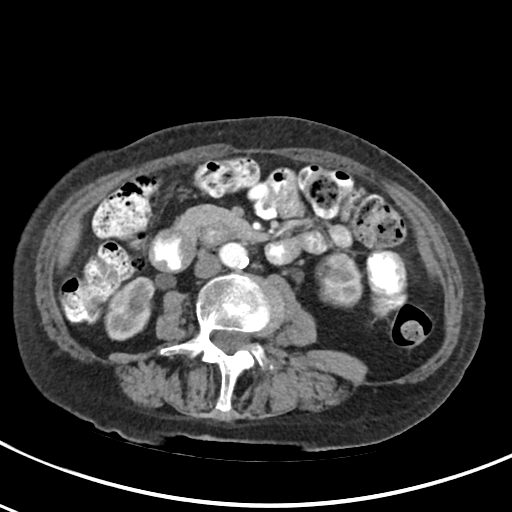
[im 58/87  soft-tissue]
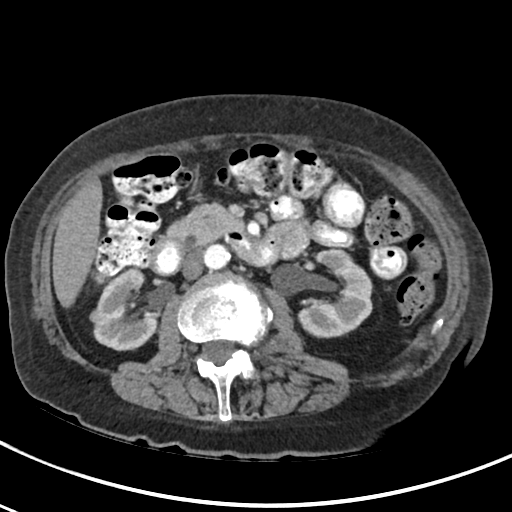
[im 67/87  soft-tissue]
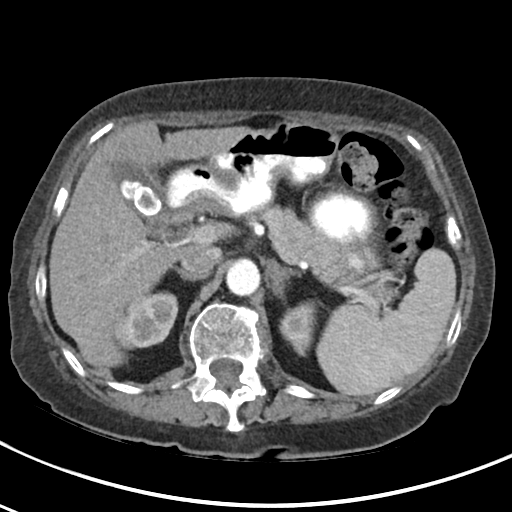
[im 67/87  bone]
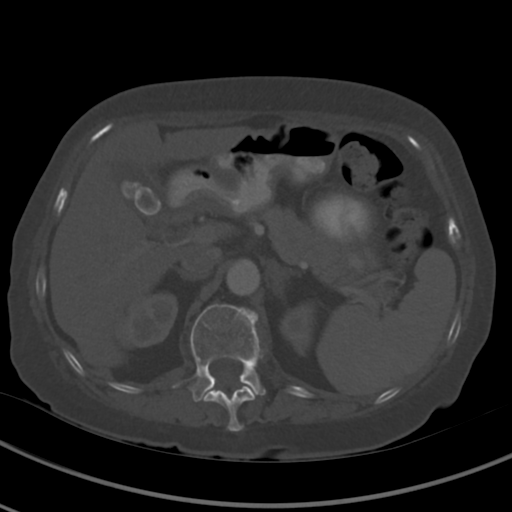
[im 72/87  soft-tissue]
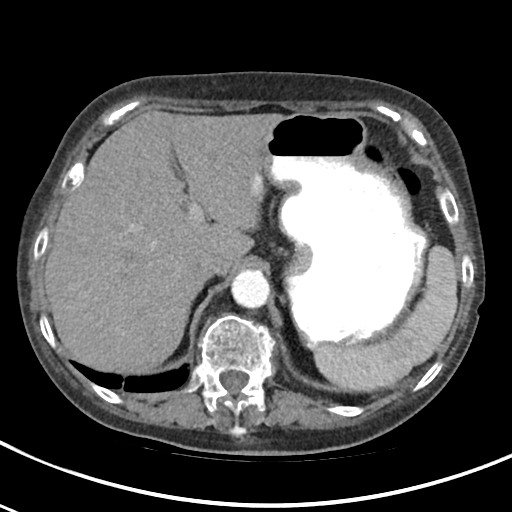
[im 82/87  soft-tissue]
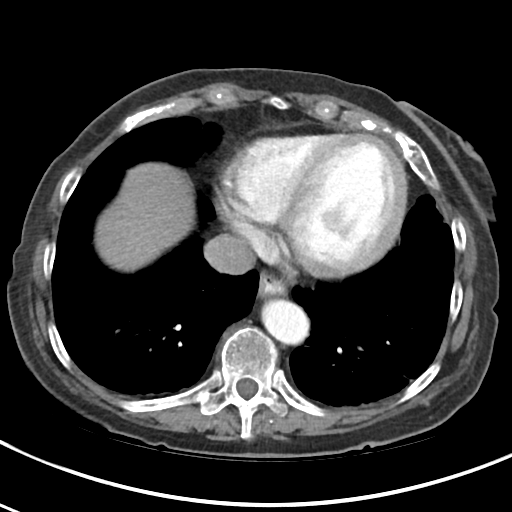

[Series 4: coronals abd pelvis 2.00 cor · coronal · 0.61mm/px · 3 of 121 slices shown]
[im 41/121  soft-tissue]
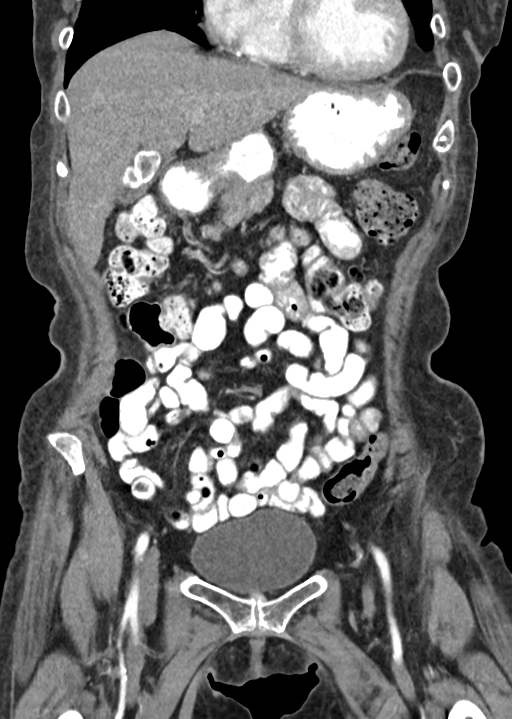
[im 54/121  soft-tissue]
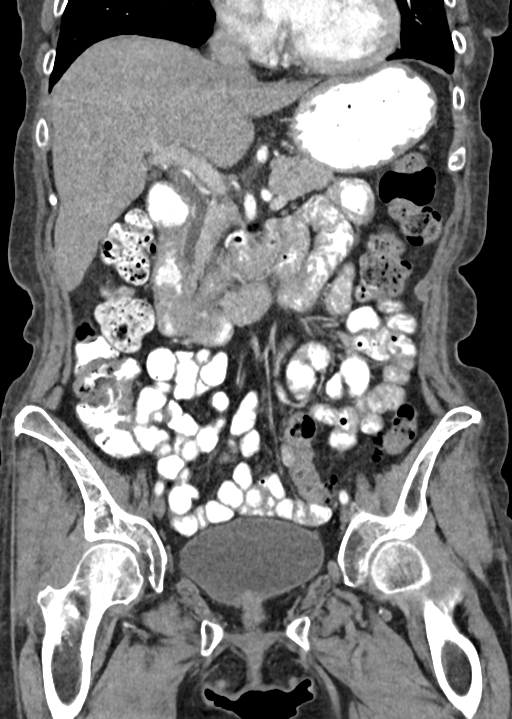
[im 67/121  soft-tissue]
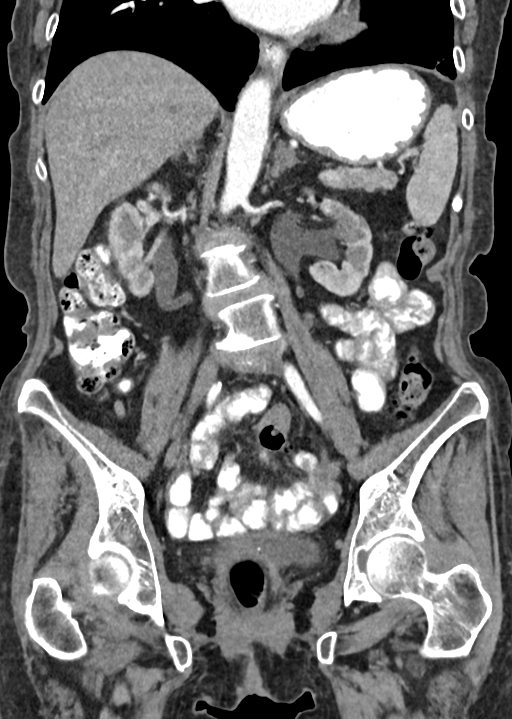

[14 of 46 positions shown; findings below may reference images not displayed]

FINDINGS: Lower chest: Right pleural fluid has resolved since the previous
chest CT examination. Again noted are chronic changes at the lung
bases. There are some patchy ground-glass opacities in left lower
lobe that are probably new since 04/17/2021 and are likely
inflammatory in etiology.

Hepatobiliary: 2 large calcified gallstones. Index gallstone
measures 1.9 cm. There is no evidence for gallbladder distention or
inflammation. However, there is wall thickening or a fold at the
gastric fundus and these findings are similar to the previous
examination. No suspiciou hepatic lesions. There is at least 1 tiny
hypodensity in liver that is too small to definitively characterize.

Pancreas: Unremarkable. No pancreatic ductal dilatation or
surrounding inflammatory changes.

Spleen: Again noted is a hypodensity in the spleen and this area
measures roughly 7 mm. This is likely an incidental finding. No
evidence for splenomegaly.

Adrenals/Urinary Tract: Mild fullness in left adrenal gland. Normal
appearance of the right adrenal gland. There is an enlarging
exophytic structure along the lateral right kidney that measures up
to 8 mm on sequence 4, image 77. Small enhancing lesion cannot be
excluded. There may be small exophytic cyst involving the right
kidney as well. Normal appearance of the urinary bladder. Fullness
in the renal pelvis in both kidneys of uncertain etiology.

Stomach/Bowel: Diverticulosis in the left colon without acute
inflammation. Normal appendix without inflammation and no evidence
for a mucocele. Oral contrast in the small and large bowel. No
evidence for bowel obstruction. Asymmetric wall thickening involving
the duodenum near the ampulla on sequence 2 image 33. However, this
area appears to be normal on the delayed images and patient had
normal a duodenum on recent endoscopy. Wall thickening may be
related to peristalsis. No inflammatory changes around the duodenum.
No acute abnormality in the stomach.

Vascular/Lymphatic: Atherosclerotic calcifications in the abdominal
aorta without aneurysm. No lymph node enlargement in the abdomen or
pelvis.

Reproductive: Status post hysterectomy. No adnexal masses.

Other: Negative for ascites.  Negative for free air.

Musculoskeletal: Again noted is severe disc space loss and endplate
changes at L2-L3 and L4-L5. Again noted is a prominent Schmorl's
node along the superior endplate of L2. Curvature in the lumbar
spine.
IMPRESSION: 1. Normal appendix.  No evidence for an appendix mucocele.
2. **An incidental finding of potential clinical significance has
been found. Enlarging hyperdense exophytic structure involving the
right kidney now measuring up to 8 mm. This could represent a
hyperdense or complex cyst based on the previous imaging but cannot
exclude a small enhancing lesion. Recommend further evaluation with
MRI of the kidneys, with and without contrast. **
3. Cholelithiasis without acute gallbladder inflammation.
4. Scoliosis with severe multilevel degenerative changes.

## 2022-12-18 ENCOUNTER — Encounter: Payer: Self-pay | Admitting: *Deleted

## 2022-12-19 ENCOUNTER — Encounter: Payer: Self-pay | Admitting: *Deleted

## 2022-12-19 ENCOUNTER — Ambulatory Visit: Payer: Medicare HMO | Admitting: Certified Registered"

## 2022-12-19 ENCOUNTER — Encounter
Admission: RE | Disposition: A | Discharge status: Home / Self Care | Payer: Self-pay | Source: Home / Self Care | Attending: Gastroenterology

## 2022-12-19 ENCOUNTER — Ambulatory Visit
Admission: RE | Admit: 2022-12-19 | Discharge: 2022-12-19 | Disposition: A | Discharge status: Home / Self Care | Payer: Medicare HMO | Attending: Gastroenterology | Admitting: Gastroenterology

## 2022-12-19 DIAGNOSIS — Z1211 Encounter for screening for malignant neoplasm of colon: Secondary | ICD-10-CM | POA: Diagnosis present

## 2022-12-19 DIAGNOSIS — I10 Essential (primary) hypertension: Secondary | ICD-10-CM | POA: Diagnosis not present

## 2022-12-19 DIAGNOSIS — D123 Benign neoplasm of transverse colon: Secondary | ICD-10-CM | POA: Diagnosis not present

## 2022-12-19 DIAGNOSIS — K5520 Angiodysplasia of colon without hemorrhage: Secondary | ICD-10-CM | POA: Diagnosis not present

## 2022-12-19 DIAGNOSIS — K640 First degree hemorrhoids: Secondary | ICD-10-CM | POA: Insufficient documentation

## 2022-12-19 DIAGNOSIS — Z87891 Personal history of nicotine dependence: Secondary | ICD-10-CM | POA: Insufficient documentation

## 2022-12-19 DIAGNOSIS — E785 Hyperlipidemia, unspecified: Secondary | ICD-10-CM | POA: Diagnosis not present

## 2022-12-19 DIAGNOSIS — M797 Fibromyalgia: Secondary | ICD-10-CM | POA: Insufficient documentation

## 2022-12-19 DIAGNOSIS — M069 Rheumatoid arthritis, unspecified: Secondary | ICD-10-CM | POA: Insufficient documentation

## 2022-12-19 DIAGNOSIS — Z7901 Long term (current) use of anticoagulants: Secondary | ICD-10-CM | POA: Diagnosis not present

## 2022-12-19 DIAGNOSIS — F32A Depression, unspecified: Secondary | ICD-10-CM | POA: Diagnosis not present

## 2022-12-19 DIAGNOSIS — K5730 Diverticulosis of large intestine without perforation or abscess without bleeding: Secondary | ICD-10-CM | POA: Insufficient documentation

## 2022-12-19 DIAGNOSIS — M329 Systemic lupus erythematosus, unspecified: Secondary | ICD-10-CM | POA: Insufficient documentation

## 2022-12-19 DIAGNOSIS — Z86718 Personal history of other venous thrombosis and embolism: Secondary | ICD-10-CM | POA: Insufficient documentation

## 2022-12-19 DIAGNOSIS — E0500 Thyrotoxicosis with diffuse goiter without thyrotoxic crisis or storm: Secondary | ICD-10-CM | POA: Diagnosis not present

## 2022-12-19 DIAGNOSIS — K219 Gastro-esophageal reflux disease without esophagitis: Secondary | ICD-10-CM | POA: Diagnosis not present

## 2022-12-19 DIAGNOSIS — J449 Chronic obstructive pulmonary disease, unspecified: Secondary | ICD-10-CM | POA: Diagnosis not present

## 2022-12-19 DIAGNOSIS — Z86010 Personal history of colonic polyps: Secondary | ICD-10-CM | POA: Insufficient documentation

## 2022-12-19 DIAGNOSIS — D122 Benign neoplasm of ascending colon: Secondary | ICD-10-CM | POA: Insufficient documentation

## 2022-12-19 DIAGNOSIS — F419 Anxiety disorder, unspecified: Secondary | ICD-10-CM | POA: Diagnosis not present

## 2022-12-19 DIAGNOSIS — E119 Type 2 diabetes mellitus without complications: Secondary | ICD-10-CM | POA: Diagnosis not present

## 2022-12-19 DIAGNOSIS — M1990 Unspecified osteoarthritis, unspecified site: Secondary | ICD-10-CM | POA: Diagnosis not present

## 2022-12-19 DIAGNOSIS — Z7984 Long term (current) use of oral hypoglycemic drugs: Secondary | ICD-10-CM | POA: Diagnosis not present

## 2022-12-19 DIAGNOSIS — D6861 Antiphospholipid syndrome: Secondary | ICD-10-CM | POA: Insufficient documentation

## 2022-12-19 HISTORY — PX: COLONOSCOPY WITH PROPOFOL: SHX5780

## 2022-12-19 SURGERY — COLONOSCOPY WITH PROPOFOL
Anesthesia: General

## 2022-12-19 MED ORDER — PROPOFOL 10 MG/ML IV BOLUS
INTRAVENOUS | Status: DC | PRN
  Administered 2022-12-19: 150 ug/kg/min via INTRAVENOUS
  Administered 2022-12-19: 50 mg via INTRAVENOUS

## 2022-12-19 MED ORDER — PROPOFOL 1000 MG/100ML IV EMUL
INTRAVENOUS | Status: AC
  Filled 2022-12-19: qty 300

## 2022-12-19 MED ORDER — LIDOCAINE HCL (PF) 2 % IJ SOLN
INTRAMUSCULAR | Status: AC
  Filled 2022-12-19: qty 30

## 2022-12-19 MED ORDER — LIDOCAINE HCL (CARDIAC) PF 100 MG/5ML IV SOSY
PREFILLED_SYRINGE | INTRAVENOUS | Status: DC | PRN
  Administered 2022-12-19: 50 mg via INTRAVENOUS

## 2022-12-19 MED ORDER — SODIUM CHLORIDE 0.9 % IV SOLN: INTRAVENOUS | Status: DC

## 2022-12-19 NOTE — H&P (Signed)
Outpatient short stay form Pre-procedure 12/19/2022  Lesly Rubenstein, MD  Primary Physician: Sharyne Peach, MD  Reason for visit:  History of polyps  History of present illness:    67 y/o lady with history of anti-phospholipid syndrome, graves disease, hypothyroidism, and RA here for Colonoscopy for multiple polyps on last colonoscopy in 2022. Mother with colon cancer in her 16s. Has chronic thrombocytopenia in the 60's. She takes eliquis with last dose 2 days ago. Per kidney functions, she should have held her NOAC for 3 days. Explained risks of bleeding to patient and noted we could not remove large polyps but could remove small lesions and do biopsies.    Current Facility-Administered Medications:    0.9 %  sodium chloride infusion, , Intravenous, Continuous, Dreamer Carillo, Hilton Cork, MD  Medications Prior to Admission  Medication Sig Dispense Refill Last Dose   albuterol (PROVENTIL) (2.5 MG/3ML) 0.083% nebulizer solution Take 2.5 mg by nebulization every 4 (four) hours as needed for wheezing or shortness of breath.    Past Month   albuterol (VENTOLIN HFA) 108 (90 Base) MCG/ACT inhaler Inhale 2 puffs into the lungs every 4 (four) hours as needed for wheezing or shortness of breath.   Past Month   buPROPion (WELLBUTRIN SR) 150 MG 12 hr tablet Take 150 mg by mouth daily.   Past Week   DULoxetine (CYMBALTA) 20 MG capsule Take 1 capsule by mouth 1 day or 1 dose.   Past Week   esomeprazole (NEXIUM) 40 MG capsule Take by mouth.   Past Week   furosemide (LASIX) 20 MG tablet Take 1 tablet by mouth daily as needed.   Past Week   levothyroxine (SYNTHROID, LEVOTHROID) 88 MCG tablet Take 75 mcg by mouth daily before breakfast.    Past Week   losartan (COZAAR) 50 MG tablet Take 50 mg by mouth daily. prn   Past Week   metFORMIN (GLUCOPHAGE) 500 MG tablet Take 500 mg by mouth daily.    12/18/2022   predniSONE (DELTASONE) 10 MG tablet Take 5 mg by mouth daily with breakfast.    12/18/2022   acetaminophen  (TYLENOL) 650 MG CR tablet Take 650 mg by mouth every 8 (eight) hours as needed for pain.      apixaban (ELIQUIS) 5 MG TABS tablet Take 1 tablet (5 mg total) by mouth 2 (two) times daily. 60 tablet 2 12/17/2022   ascorbic acid (VITAMIN C) 100 MG tablet Take 1,000 mg by mouth daily.      atropine 1 % ophthalmic solution Place 1 drop into the right eye in the morning and at bedtime.      brimonidine-timolol (COMBIGAN) 0.2-0.5 % ophthalmic solution Place 3 drops into both eyes every 12 (twelve) hours.      Brinzolamide-Brimonidine 1-0.2 % SUSP Apply 2.5 mLs to eye 1 day or 1 dose.      cyclobenzaprine (FLEXERIL) 5 MG tablet Take 5 mg by mouth 3 (three) times daily as needed for muscle spasms.      Efinaconazole (JUBLIA) 10 % SOLN Apply topically daily.      ergocalciferol (VITAMIN D2) 1.25 MG (50000 UT) capsule Take 50,000 Units by mouth every Friday.      erythromycin ophthalmic ointment Place 1 application into both eyes at bedtime.      ferrous sulfate 325 (65 FE) MG tablet Take by mouth. (Patient not taking: No sig reported)      mometasone (NASONEX) 50 MCG/ACT nasal spray Place 2 sprays into the nose daily.  neomycin-bacitracin-polymyxin (NEOSPORIN) ophthalmic ointment Place 1 application into both eyes at bedtime.      prednisoLONE acetate (PRED FORTE) 1 % ophthalmic suspension Place 1 drop into both eyes 1 day or 1 dose.      simvastatin (ZOCOR) 40 MG tablet Take 40 mg by mouth daily.      SYMBICORT 160-4.5 MCG/ACT inhaler         Allergies  Allergen Reactions   Morphine And Related Anaphylaxis   Montelukast     Other reaction(s): Other (See Comments) Nightmares   Antihistamines, Diphenhydramine-Type Nausea Only   Aspirin Other (See Comments)    Patient on blood thinners and was told not to take   Benadryl [Diphenhydramine Hcl] Hives   Codeine Other (See Comments)    AMS     Past Medical History:  Diagnosis Date   Anxiety    Arthritis    Collagen vascular disease ([place])     COPD (chronic obstructive pulmonary disease) ([place])    DDD (degenerative disc disease), lumbar    Depression    Diabetes mellitus without complication ([place])    DVT (deep venous thrombosis) ([facility])    Fibromyalgia    GERD (gastroesophageal reflux disease)    Graves' disease with exophthalmos    HLD (hyperlipidemia)    Hypertension    Hypothyroidism    Lupus ([place])    Osteoporosis     Review of systems:  Otherwise negative.    Physical Exam  Gen: Alert, oriented. Appears stated age.  HEENT: PERRLA. Lungs: No respiratory distress CV: RRR Abd: soft, benign, no masses Ext: No edema    Planned procedures: Proceed with colonoscopy. The patient understands the nature of the planned procedure, indications, risks, alternatives and potential complications including but not limited to bleeding, infection, perforation, damage to internal organs and possible oversedation/side effects from anesthesia. The patient agrees and gives consent to proceed.  Please refer to procedure notes for findings, recommendations and patient disposition/instructions.     Lesly Rubenstein, MD [facility]

## 2022-12-19 NOTE — Interval H&P Note (Signed)
History and Physical Interval Note:  12/19/2022 7:57 AM  Alison Lopez  has presented today for surgery, with the diagnosis of PH Colon Polyps.  The various methods of treatment have been discussed with the patient and family. After consideration of risks, benefits and other options for treatment, the patient has consented to  Procedure(s): COLONOSCOPY WITH PROPOFOL (N/A) as a surgical intervention.  The patient's history has been reviewed, patient examined, no change in status, stable for surgery.  I have reviewed the patient's chart and labs.  Questions were answered to the patient's satisfaction.     Lesly Rubenstein  Ok to proceed with colonoscopy

## 2022-12-19 NOTE — Anesthesia Postprocedure Evaluation (Signed)
Anesthesia Post Note  Patient: Alison Lopez  Procedure(s) Performed: COLONOSCOPY WITH PROPOFOL  Patient location during evaluation: PACU Anesthesia Type: General Level of consciousness: oriented and awake Pain management: pain level controlled Vital Signs Assessment: post-procedure vital signs reviewed and stable Respiratory status: spontaneous breathing and nonlabored ventilation Cardiovascular status: stable Anesthetic complications: no  There were no known notable events for this encounter.   Last Vitals:  Vitals:   12/19/22 0859 12/19/22 0910  BP: 113/77 113/84  Pulse: (!) 58 (!) 50  Resp: 14 11  Temp:    SpO2: 94% 97%    Last Pain:  Vitals:   12/19/22 0910  TempSrc:   PainSc: 0-No pain                 VAN STAVEREN,Ericson Nafziger

## 2022-12-19 NOTE — Transfer of Care (Signed)
Immediate Anesthesia Transfer of Care Note  Patient: Alison Lopez  Procedure(s) Performed: COLONOSCOPY WITH PROPOFOL  Patient Location: PACU  Anesthesia Type:General  Level of Consciousness: drowsy  Airway & Oxygen Therapy: Patient Spontanous Breathing and Patient connected to nasal cannula oxygen  Post-op Assessment: Report given to RN and Post -op Vital signs reviewed and stable  Post vital signs: Reviewed and stable  Last Vitals:  Vitals Value Taken Time  BP    Temp    Pulse    Resp    SpO2      Last Pain:  Vitals:   12/19/22 0737  TempSrc: Temporal         Complications: No notable events documented.

## 2022-12-19 NOTE — Anesthesia Preprocedure Evaluation (Signed)
Anesthesia Evaluation  Patient identified by MRN, date of birth, ID band Patient awake    Reviewed: Allergy & Precautions, NPO status , Patient's Chart, lab work & pertinent test results  Airway Mallampati: III  TM Distance: <3 FB Neck ROM: full    Dental  (+) Missing, Poor Dentition, Chipped, Dental Advisory Given   Pulmonary neg pulmonary ROS, COPD,  COPD inhaler, Patient abstained from smoking., former smoker   Pulmonary exam normal  + decreased breath sounds      Cardiovascular Exercise Tolerance: Poor hypertension, Pt. on medications negative cardio ROS Normal cardiovascular exam Rhythm:Regular     Neuro/Psych   Anxiety Depression     Neuromuscular disease negative neurological ROS  negative psych ROS   GI/Hepatic negative GI ROS, Neg liver ROS,GERD  Medicated,,  Endo/Other  negative endocrine ROSdiabetes, Type 2    Renal/GU negative Renal ROS  negative genitourinary   Musculoskeletal  (+) Arthritis ,  Fibromyalgia -  Abdominal Normal abdominal exam  (+)   Peds negative pediatric ROS (+)  Hematology negative hematology ROS (+)   Anesthesia Other Findings Past Medical History: No date: Anxiety No date: Arthritis No date: Collagen vascular disease (HCC) No date: COPD (chronic obstructive pulmonary disease) (HCC) No date: DDD (degenerative disc disease), lumbar No date: Depression No date: Diabetes mellitus without complication (HCC) No date: DVT (deep venous thrombosis) (HCC) No date: Fibromyalgia No date: GERD (gastroesophageal reflux disease) No date: Graves' disease with exophthalmos No date: HLD (hyperlipidemia) No date: Hypertension No date: Hypothyroidism No date: Lupus ([place]) No date: Osteoporosis  Past Surgical History: No date: ABDOMINAL HYSTERECTOMY 06/11/2021: COLONOSCOPY WITH PROPOFOL; N/A     Comment:  Procedure: COLONOSCOPY WITH PROPOFOL;  Surgeon:               Lesly Rubenstein, MD;   Location: [facility] ENDOSCOPY;                Service: Endoscopy;  Laterality: N/A;  prefers               afternoon Eliquis 06/11/2021: ESOPHAGOGASTRODUODENOSCOPY; N/A     Comment:  Procedure: ESOPHAGOGASTRODUODENOSCOPY (EGD);  Surgeon:               Lesly Rubenstein, MD;  Location: [facility] ENDOSCOPY;                Service: Endoscopy;  Laterality: N/A; No date: EYE SURGERY; Right 07/21/2019: IVC FILTER REMOVAL; N/A     Comment:  Procedure: IVC FILTER REMOVAL;  Surgeon: Algernon Huxley,               MD;  Location: [facility];  Service:               Cardiovascular;  Laterality: N/A; 04/22/2019: LOWER EXTREMITY INTERVENTION; Right     Comment:  Procedure: IVC Filter Insertion with Right Lower               Extremity Venous Lysis;  Surgeon: Algernon Huxley, MD;                Location: [facility];  Service: Cardiovascular;              Laterality: Right; No date: OOPHORECTOMY     Reproductive/Obstetrics negative OB ROS                             Anesthesia Physical Anesthesia Plan  ASA:  3  Anesthesia Plan: General   Post-op Pain Management:    Induction: Intravenous  PONV Risk Score and Plan: Propofol infusion and TIVA  Airway Management Planned: Natural Airway and Nasal Cannula  Additional Equipment:   Intra-op Plan:   Post-operative Plan:   Informed Consent: I have reviewed the patients History and Physical, chart, labs and discussed the procedure including the risks, benefits and alternatives for the proposed anesthesia with the patient or authorized representative who has indicated his/her understanding and acceptance.     Dental Advisory Given  Plan Discussed with: CRNA and Surgeon  Anesthesia Plan Comments:        Anesthesia Quick Evaluation

## 2022-12-19 NOTE — Op Note (Signed)
[facility] Patient Name: Alison Lopez Procedure Date: 12/19/2022 7:13 AM MRN: [id number] Account #: [id number] Date of Birth: [birth year] Admit Type: Outpatient Age: 67 Room: [facility] ENDO ROOM 3 Gender: Female Note Status: Finalized Instrument Name: Peds Colonoscope [id number] Procedure:             Colonoscopy Indications:           High risk colon cancer surveillance: Personal history                         of sessile serrated colon polyp with dysplasia Providers:             Andrey Farmer MD, MD Referring MD:          Rubbie Battiest. Iona Beard MD, MD (Referring MD) Medicines:             Monitored Anesthesia Care Complications:         No immediate complications. Estimated blood loss:                         Minimal. Procedure:             Pre-Anesthesia Assessment:                        - Prior to the procedure, a History and Physical was                         performed, and patient medications and allergies were                         reviewed. The patient is competent. The risks and                         benefits of the procedure and the sedation options and                         risks were discussed with the patient. All questions                         were answered and informed consent was obtained.                         Patient identification and proposed procedure were                         verified by the physician, the nurse, the                         anesthesiologist, the anesthetist and the technician                         in the endoscopy suite. Mental Status Examination:                         alert and oriented. Airway Examination: normal                         oropharyngeal airway and neck mobility. Respiratory  Examination: clear to auscultation. CV Examination:                         normal. Prophylactic Antibiotics: The patient does not                         require prophylactic antibiotics.  Prior                         Anticoagulants: The patient has taken Eliquis                         (apixaban), last dose was 2 days prior to procedure.                         ASA Grade Assessment: III - A patient with severe                         systemic disease. After reviewing the risks and                         benefits, the patient was deemed in satisfactory                         condition to undergo the procedure. The anesthesia                         plan was to use monitored anesthesia care (MAC).                         Immediately prior to administration of medications,                         the patient was re-assessed for adequacy to receive                         sedatives. The heart rate, respiratory rate, oxygen                         saturations, blood pressure, adequacy of pulmonary                         ventilation, and response to care were monitored                         throughout the procedure. The physical status of the                         patient was re-assessed after the procedure.                        After obtaining informed consent, the colonoscope was                         passed under direct vision. Throughout the procedure,                         the patient's blood pressure, pulse, and oxygen  saturations were monitored continuously. The                         Colonoscope was introduced through the anus and                         advanced to the the cecum, identified by appendiceal                         orifice and ileocecal valve. The colonoscopy was                         performed without difficulty. The patient tolerated                         the procedure well. The quality of the bowel                         preparation was good. The ileocecal valve, appendiceal                         orifice, and rectum were photographed. Findings:      The perianal and digital rectal examinations were normal.       A single large localized angioectasia without bleeding was found in the       ascending colon.      A 12 mm polyp was found in the ascending colon. The polyp was sessile.       The polyp was removed with a hot snare. Resection and retrieval were       complete. To prevent bleeding after the polypectomy, two hemostatic       clips were successfully placed. There was no bleeding during, or at the       end, of the procedure. Estimated blood loss: none.      Two sessile polyps were found in the transverse colon. The polyps were 4       to 5 mm in size. These polyps were removed with a cold snare. Resection       and retrieval were complete. Estimated blood loss was minimal.      Many large-mouthed and small-mouthed diverticula were found in the       sigmoid colon, descending colon, transverse colon, hepatic flexure and       ascending colon.      Internal hemorrhoids were found during retroflexion. The hemorrhoids       were Grade I (internal hemorrhoids that do not prolapse).      The exam was otherwise without abnormality on direct and retroflexion       views. Impression:            - A single non-bleeding colonic angioectasia.                        - One 12 mm polyp in the ascending colon, removed with                         a hot snare. Resected and retrieved. Clips were placed.                        - Two 4 to 5 mm polyps in  the transverse colon,                         removed with a cold snare. Resected and retrieved.                        - Diverticulosis in the sigmoid colon, in the                         descending colon, in the transverse colon, at the                         hepatic flexure and in the ascending colon.                        - Internal hemorrhoids.                        - The examination was otherwise normal on direct and                         retroflexion views. Recommendation:        - Discharge patient to home.                        - Resume  previous diet.                        - Resume Eliquis (apixaban) at prior dose in 2 days.                        - Await pathology results.                        - Repeat colonoscopy in 3 years for surveillance.                        - Return to referring physician as previously                         scheduled. Procedure Code(s):     --- Professional ---                        [phone], Colonoscopy, flexible; with removal of                         tumor(s), polyp(s), or other lesion(s) by snare                         technique Diagnosis Code(s):     --- Professional ---                        Z86.010, Personal history of colonic polyps                        K55.20, Angiodysplasia of colon without hemorrhage                        D12.2, Benign neoplasm of ascending colon  D12.3, Benign neoplasm of transverse colon (hepatic                         flexure or splenic flexure)                        K64.0, First degree hemorrhoids                        K57.30, Diverticulosis of large intestine without                         perforation or abscess without bleeding CPT copyright 2022 [employer]. All rights reserved. The codes documented in this report are preliminary and upon coder review may  be revised to meet current compliance requirements. Andrey Farmer MD, MD 12/19/2022 8:53:11 AM Number of Addenda: 0 Note Initiated On: 12/19/2022 7:13 AM Scope Withdrawal Time: 0 hours 16 minutes 41 seconds  Total Procedure Duration: 0 hours 23 minutes 51 seconds  Estimated Blood Loss:  Estimated blood loss was minimal.      [facility]

## 2022-12-21 NOTE — Progress Notes (Signed)
Non-identified Voicemail.  No Message Left.

## 2022-12-22 ENCOUNTER — Encounter: Payer: Self-pay | Admitting: Gastroenterology

## 2022-12-22 LAB — SURGICAL PATHOLOGY

## 2023-12-16 ENCOUNTER — Other Ambulatory Visit: Payer: Self-pay

## 2023-12-16 DIAGNOSIS — F0390 Unspecified dementia without behavioral disturbance: Secondary | ICD-10-CM | POA: Diagnosis not present

## 2023-12-16 DIAGNOSIS — R339 Retention of urine, unspecified: Secondary | ICD-10-CM | POA: Insufficient documentation

## 2023-12-16 DIAGNOSIS — Z5321 Procedure and treatment not carried out due to patient leaving prior to being seen by health care provider: Secondary | ICD-10-CM | POA: Insufficient documentation

## 2023-12-16 DIAGNOSIS — N3000 Acute cystitis without hematuria: Secondary | ICD-10-CM | POA: Diagnosis not present

## 2023-12-16 DIAGNOSIS — M549 Dorsalgia, unspecified: Secondary | ICD-10-CM | POA: Diagnosis not present

## 2023-12-16 DIAGNOSIS — I10 Essential (primary) hypertension: Secondary | ICD-10-CM | POA: Diagnosis not present

## 2023-12-16 DIAGNOSIS — E119 Type 2 diabetes mellitus without complications: Secondary | ICD-10-CM | POA: Diagnosis not present

## 2023-12-16 DIAGNOSIS — R300 Dysuria: Secondary | ICD-10-CM | POA: Diagnosis present

## 2023-12-16 LAB — BASIC METABOLIC PANEL
Anion gap: 9 (ref 5–15)
BUN: 15 mg/dL (ref 8–23)
CO2: 25 mmol/L (ref 22–32)
Calcium: 9 mg/dL (ref 8.9–10.3)
Chloride: 104 mmol/L (ref 98–111)
Creatinine, Ser: 1.34 mg/dL — ABNORMAL HIGH (ref 0.44–1.00)
GFR, Estimated: 43 mL/min — ABNORMAL LOW (ref >60)
Glucose, Bld: 113 mg/dL — ABNORMAL HIGH (ref 70–99)
Potassium: 4 mmol/L (ref 3.5–5.1)
Sodium: 138 mmol/L (ref 135–145)

## 2023-12-16 LAB — CBC WITH DIFFERENTIAL/PLATELET
Abs Immature Granulocytes: 0.01 10*3/uL (ref 0.00–0.07)
Basophils Absolute: 0.1 10*3/uL (ref 0.0–0.1)
Basophils Relative: 2 %
Eosinophils Absolute: 0.3 10*3/uL (ref 0.0–0.5)
Eosinophils Relative: 5 %
HCT: 41.3 % (ref 36.0–46.0)
Hemoglobin: 14 g/dL (ref 12.0–15.0)
Immature Granulocytes: 0 %
Lymphocytes Relative: 28 %
Lymphs Abs: 1.9 10*3/uL (ref 0.7–4.0)
MCH: 31 pg (ref 26.0–34.0)
MCHC: 33.9 g/dL (ref 30.0–36.0)
MCV: 91.4 fL (ref 80.0–100.0)
Monocytes Absolute: 0.5 10*3/uL (ref 0.1–1.0)
Monocytes Relative: 7 %
Neutro Abs: 3.9 10*3/uL (ref 1.7–7.7)
Neutrophils Relative %: 58 %
Platelets: 57 10*3/uL — ABNORMAL LOW (ref 150–400)
RBC: 4.52 MIL/uL (ref 3.87–5.11)
RDW: 13.7 % (ref 11.5–15.5)
Smear Review: NORMAL
WBC: 6.8 10*3/uL (ref 4.0–10.5)
nRBC: 0 % (ref 0.0–0.2)

## 2023-12-16 NOTE — ED Provider Triage Note (Signed)
 Emergency Medicine Provider Triage Evaluation Note  ALEEN MARSTON , a 68 y.o. female  was evaluated in triage.  Pt complains of urinary retention, back pain.  Relative states patient has dementia  Review of Systems  Positive:  Negative:   Physical Exam  BP 106/85   Pulse 64   Temp 98.3 F (36.8 C) (Oral)   Resp 17   Ht 5' 1 (1.549 m)   SpO2 99%   BMI 20.86 kg/m  Gen:   Awake, no distress   Resp:  Normal effort  MSK:   Moves extremities without difficulty  Other:    Medical Decision Making  Medically screening exam initiated at 8:40 PM.  Appropriate orders placed.  LACE CHENEVERT was informed that the remainder of the evaluation will be completed by another provider, this initial triage assessment does not replace that evaluation, and the importance of remaining in the ED until their evaluation is complete.  Patient with urinary retention, possible UTI, ordered CBC UA bladder scan   Janit Kast, PA-C 12/16/23 2040

## 2023-12-16 NOTE — ED Triage Notes (Signed)
 Pt to ED via POV c/o urinary retention and lower abd cramping. Has been going for a couple of days. Has peed a little bit 2 hrs ago.

## 2023-12-17 ENCOUNTER — Encounter: Payer: Self-pay | Admitting: Intensive Care

## 2023-12-17 ENCOUNTER — Emergency Department
Admission: EM | Admit: 2023-12-17 | Discharge: 2023-12-17 | Disposition: A | Discharge status: Home / Self Care | Payer: Medicare HMO | Attending: Emergency Medicine | Admitting: Emergency Medicine

## 2023-12-17 ENCOUNTER — Emergency Department
Admission: EM | Admit: 2023-12-17 | Discharge: 2023-12-17 | Disposition: A | Discharge status: Home / Self Care | Payer: Medicare HMO | Source: Home / Self Care | Attending: Emergency Medicine | Admitting: Emergency Medicine

## 2023-12-17 ENCOUNTER — Other Ambulatory Visit: Payer: Self-pay

## 2023-12-17 DIAGNOSIS — N3000 Acute cystitis without hematuria: Secondary | ICD-10-CM

## 2023-12-17 DIAGNOSIS — E119 Type 2 diabetes mellitus without complications: Secondary | ICD-10-CM | POA: Insufficient documentation

## 2023-12-17 DIAGNOSIS — R339 Retention of urine, unspecified: Secondary | ICD-10-CM | POA: Insufficient documentation

## 2023-12-17 DIAGNOSIS — F0390 Unspecified dementia without behavioral disturbance: Secondary | ICD-10-CM | POA: Insufficient documentation

## 2023-12-17 DIAGNOSIS — I10 Essential (primary) hypertension: Secondary | ICD-10-CM | POA: Insufficient documentation

## 2023-12-17 HISTORY — DX: Unspecified dementia, unspecified severity, without behavioral disturbance, psychotic disturbance, mood disturbance, and anxiety: F03.90

## 2023-12-17 LAB — URINALYSIS, ROUTINE W REFLEX MICROSCOPIC
Bilirubin Urine: NEGATIVE
Glucose, UA: NEGATIVE mg/dL
Hgb urine dipstick: NEGATIVE
Ketones, ur: NEGATIVE mg/dL
Leukocytes,Ua: NEGATIVE
Nitrite: POSITIVE — AB
Protein, ur: NEGATIVE mg/dL
Specific Gravity, Urine: 1.006 (ref 1.005–1.030)
pH: 5 (ref 5.0–8.0)

## 2023-12-17 MED ORDER — ACETAMINOPHEN 500 MG PO TABS
1000.0000 mg | ORAL_TABLET | Freq: Once | ORAL | Status: AC
  Administered 2023-12-17: 1000 mg via ORAL
  Filled 2023-12-17: qty 2

## 2023-12-17 MED ORDER — CEPHALEXIN 500 MG PO CAPS
500.0000 mg | ORAL_CAPSULE | Freq: Once | ORAL | Status: AC
  Administered 2023-12-17: 500 mg via ORAL
  Filled 2023-12-17: qty 1

## 2023-12-17 MED ORDER — FLUCONAZOLE 150 MG PO TABS: 150.0000 mg | ORAL_TABLET | Freq: Every day | ORAL | 0 refills | Status: DC

## 2023-12-17 MED ORDER — CEPHALEXIN 500 MG PO CAPS: 500.0000 mg | ORAL_CAPSULE | Freq: Three times a day (TID) | ORAL | 0 refills | Status: AC

## 2023-12-17 MED ORDER — SODIUM CHLORIDE 0.9 % IV BOLUS
1000.0000 mL | Freq: Once | INTRAVENOUS | Status: AC
  Administered 2023-12-17: 1000 mL via INTRAVENOUS

## 2023-12-17 NOTE — ED Notes (Signed)
 Pt was given a bladder scan of in bladder by this tech at approximately 21:00

## 2023-12-17 NOTE — ED Triage Notes (Signed)
 Patient presents with urinary retention and pain during urination. Very little output when urinating.   History dementia

## 2023-12-17 NOTE — ED Provider Triage Note (Signed)
 Emergency Medicine Provider Triage Evaluation Note  Alison Lopez , a 68 y.o. female  was evaluated in triage.  Pt complains of returns to ED after [facility] last night for urinary retention and lower abdominal pain. Hx of dementia. Patient reports it hurts to urinate.   Review of Systems  Positive:  Negative:   Physical Exam  There were no vitals taken for this visit. Gen:   Awake, no distress   Resp:  Normal effort  MSK:   Moves extremities without difficulty  Other:    Medical Decision Making  Medically screening exam initiated at 12:57 PM.  Appropriate orders placed.  TOKIKO DIEFENDERFER was informed that the remainder of the evaluation will be completed by another provider, this initial triage assessment does not replace that evaluation, and the importance of remaining in the ED until their evaluation is complete.     Margrette, Koraline Phillipson A, PA-C 12/17/23 1258

## 2023-12-17 NOTE — ED Provider Notes (Signed)
 [facility] Provider Note    Event Date/Time   First MD Initiated Contact with Patient 12/17/23 1416     (approximate)   History   Urinary Retention   HPI  Alison Lopez is a 68 y.o. female with history of diabetes, HTN, DVT, dementia, and as listed in EMR who presents to the ER for evaluation of  dysuria and only urinating small amounts at a time. No fever or back pain.      Physical Exam   Triage Vital Signs: ED Triage Vitals  Encounter Vitals Group     BP 12/17/23 1255 (!) 126/102     Systolic BP Percentile --      Diastolic BP Percentile --      Pulse Rate 12/17/23 1255 (!) 55     Resp 12/17/23 1255 15     Temp 12/17/23 1255 97.6 F (36.4 C)     Temp Source 12/17/23 1255 Oral     SpO2 12/17/23 1255 100 %     Weight 12/17/23 1257 115 lb (52.2 kg)     Height 12/17/23 1257 5' 1 (1.549 m)     Head Circumference --      Peak Flow --      Pain Score 12/17/23 1257 5     Pain Loc --      Pain Education --      Exclude from Growth Chart --     Most recent vital signs: Vitals:   12/17/23 1255 12/17/23 1722  BP: (!) 126/102 (!) 120/90  Pulse: (!) 55 (!) 58  Resp: 15 16  Temp: 97.6 F (36.4 C) 98 F (36.7 C)  SpO2: 100% 100%     General: Awake, no distress.  CV:  Good peripheral perfusion.  Resp:  Normal effort.  Abd:  No distention.  Other:  No CVA tenderness   ED Results / Procedures / Treatments   Labs (all labs ordered are listed, but only abnormal results are displayed) Labs Reviewed  URINALYSIS, ROUTINE W REFLEX MICROSCOPIC - Abnormal; Notable for the following components:      Result Value   Color, Urine AMBER (*)    APPearance CLEAR (*)    Nitrite POSITIVE (*)    Bacteria, UA FEW (*)    All other components within normal limits     EKG  Not indicated.   RADIOLOGY  Not indicated.   PROCEDURES:  Critical Care performed: No  Procedures   MEDICATIONS ORDERED IN ED: Medications  sodium chloride   0.9 % bolus 1,000 mL (0 mLs Intravenous Stopped 12/17/23 1658)  cephALEXin  (KEFLEX ) capsule 500 mg (500 mg Oral Given 12/17/23 1512)  acetaminophen  (TYLENOL ) tablet 1,000 mg (1,000 mg Oral Given 12/17/23 1511)     IMPRESSION / MDM / ASSESSMENT AND PLAN / ED COURSE  I reviewed the triage vital signs and the nursing notes.                              Differential diagnosis includes, but is not limited to, acute cystitis, kidney stone  Patient's presentation is most consistent with acute illness / injury with system symptoms.  Urinalysis consistent with acute cystitis. She will be treated with Keflex  and advised to follow up with PCP for questions or concerns. ER return precautions discussed.     FINAL CLINICAL IMPRESSION(S) / ED DIAGNOSES   Final diagnoses:  Acute cystitis without hematuria     Rx /  DC Orders   ED Discharge Orders          Ordered    cephALEXin  (KEFLEX ) 500 MG capsule  3 times daily        12/17/23 1714    fluconazole  (DIFLUCAN ) 150 MG tablet  Daily        12/17/23 1714             Note:  This document was prepared using Dragon voice recognition software and may include unintentional dictation errors.   Herlinda Kirk NOVAK, FNP 12/25/23 0710    Arlander Charleston, MD 12/25/23 [phone]

## 2024-01-21 ENCOUNTER — Inpatient Hospital Stay: Attending: Oncology | Admitting: Oncology

## 2024-01-21 ENCOUNTER — Inpatient Hospital Stay

## 2024-01-21 ENCOUNTER — Encounter: Payer: Self-pay | Admitting: Oncology

## 2024-01-21 VITALS — BP 135/78 | HR 54 | Temp 98.1°F | Resp 16 | Wt 114.0 lb

## 2024-01-21 DIAGNOSIS — Z800 Family history of malignant neoplasm of digestive organs: Secondary | ICD-10-CM | POA: Insufficient documentation

## 2024-01-21 DIAGNOSIS — Z87891 Personal history of nicotine dependence: Secondary | ICD-10-CM | POA: Insufficient documentation

## 2024-01-21 DIAGNOSIS — Z7901 Long term (current) use of anticoagulants: Secondary | ICD-10-CM | POA: Insufficient documentation

## 2024-01-21 DIAGNOSIS — Z86718 Personal history of other venous thrombosis and embolism: Secondary | ICD-10-CM | POA: Diagnosis not present

## 2024-01-21 DIAGNOSIS — D696 Thrombocytopenia, unspecified: Secondary | ICD-10-CM | POA: Insufficient documentation

## 2024-01-21 DIAGNOSIS — R768 Other specified abnormal immunological findings in serum: Secondary | ICD-10-CM | POA: Diagnosis not present

## 2024-01-21 DIAGNOSIS — D6861 Antiphospholipid syndrome: Secondary | ICD-10-CM

## 2024-01-21 DIAGNOSIS — E538 Deficiency of other specified B group vitamins: Secondary | ICD-10-CM | POA: Diagnosis not present

## 2024-01-21 DIAGNOSIS — Z79899 Other long term (current) drug therapy: Secondary | ICD-10-CM | POA: Insufficient documentation

## 2024-01-21 LAB — CBC WITH DIFFERENTIAL/PLATELET
Abs Immature Granulocytes: 0.01 10*3/uL (ref 0.00–0.07)
Basophils Absolute: 0.1 10*3/uL (ref 0.0–0.1)
Basophils Relative: 1 %
Eosinophils Absolute: 0.2 10*3/uL (ref 0.0–0.5)
Eosinophils Relative: 2 %
HCT: 37.1 % (ref 36.0–46.0)
Hemoglobin: 12.5 g/dL (ref 12.0–15.0)
Immature Granulocytes: 0 %
Lymphocytes Relative: 19 %
Lymphs Abs: 1.2 10*3/uL (ref 0.7–4.0)
MCH: 30.9 pg (ref 26.0–34.0)
MCHC: 33.7 g/dL (ref 30.0–36.0)
MCV: 91.8 fL (ref 80.0–100.0)
Monocytes Absolute: 0.4 10*3/uL (ref 0.1–1.0)
Monocytes Relative: 7 %
Neutro Abs: 4.3 10*3/uL (ref 1.7–7.7)
Neutrophils Relative %: 71 %
Platelets: 64 10*3/uL — ABNORMAL LOW (ref 150–400)
RBC: 4.04 MIL/uL (ref 3.87–5.11)
RDW: 13.2 % (ref 11.5–15.5)
WBC: 6.2 10*3/uL (ref 4.0–10.5)
nRBC: 0 % (ref 0.0–0.2)

## 2024-01-21 LAB — CMP (CANCER CENTER ONLY)
ALT: 9 U/L (ref 0–44)
AST: 17 U/L (ref 15–41)
Albumin: 4 g/dL (ref 3.5–5.0)
Alkaline Phosphatase: 44 U/L (ref 38–126)
Anion gap: 8 (ref 5–15)
BUN: 17 mg/dL (ref 8–23)
CO2: 24 mmol/L (ref 22–32)
Calcium: 8.7 mg/dL — ABNORMAL LOW (ref 8.9–10.3)
Chloride: 107 mmol/L (ref 98–111)
Creatinine: 0.95 mg/dL (ref 0.44–1.00)
GFR, Estimated: >60 mL/min (ref >60)
Glucose, Bld: 97 mg/dL (ref 70–99)
Potassium: 4.1 mmol/L (ref 3.5–5.1)
Sodium: 139 mmol/L (ref 135–145)
Total Bilirubin: 0.8 mg/dL (ref 0.0–1.2)
Total Protein: 7.5 g/dL (ref 6.5–8.1)

## 2024-01-21 LAB — HEPATITIS PANEL, ACUTE
HCV Ab: NONREACTIVE
Hep A IgM: NONREACTIVE
Hep B C IgM: NONREACTIVE
Hepatitis B Surface Ag: NONREACTIVE

## 2024-01-21 LAB — IMMATURE PLATELET FRACTION: Immature Platelet Fraction: 15.7 % — ABNORMAL HIGH (ref 1.2–8.6)

## 2024-01-21 LAB — TECHNOLOGIST SMEAR REVIEW
Plt Morphology: DECREASED
RBC MORPHOLOGY: NORMAL
WBC MORPHOLOGY: NORMAL

## 2024-01-21 LAB — FOLATE: Folate: 19 ng/mL (ref >5.9)

## 2024-01-21 LAB — VITAMIN B12: Vitamin B-12: 170 pg/mL — ABNORMAL LOW (ref 180–914)

## 2024-01-21 LAB — HIV ANTIBODY (ROUTINE TESTING W REFLEX): HIV Screen 4th Generation wRfx: NONREACTIVE

## 2024-01-21 LAB — LACTATE DEHYDROGENASE: LDH: 248 U/L — ABNORMAL HIGH (ref 98–192)

## 2024-01-21 MED ORDER — VITAMIN B-12 1000 MCG PO TABS: 1000.0000 ug | ORAL_TABLET | Freq: Every day | ORAL | 1 refills | Status: DC

## 2024-01-21 NOTE — Assessment & Plan Note (Signed)
 Chronic thrombocytopenia, platelet count appears at her baseline. This is most likely secondary to antiphospholipid syndrome.  Check B12, folate, hepatitis, HIV, flow cytometry, CBC, smear, immature platelet fraction.

## 2024-01-21 NOTE — Addendum Note (Signed)
 Addended by: Rickard Patience on: 01/21/2024 10:05 PM   Modules accepted: Orders

## 2024-01-21 NOTE — Assessment & Plan Note (Signed)
 Recommend patient to start oral B12 daily.

## 2024-01-21 NOTE — Progress Notes (Signed)
 Hematology/Oncology Consult note [phone] [phone]        REFERRING PROVIDER: Rayetta Humphrey, MD   CHIEF COMPLAINTS/REASON FOR VISIT:  Evaluation of thrombocytopenia, antiphospholipid syndrome   ASSESSMENT & PLAN:   Thrombocytopenia (HCC) Chronic thrombocytopenia, platelet count appears at her baseline. This is most likely secondary to antiphospholipid syndrome.  Check B12, folate, hepatitis, HIV, flow cytometry, CBC, smear, immature platelet fraction.   Antiphospholipid antibody syndrome (HCC) Previously failed Coumadin.  Clinically she is doing well on anticoagulation with Eliquis. Recommend patient to continue Eliquis 5 mg twice daily.  Daughter will confirm if patient is taking meds. I will repeat APS labs  History of DVT of lower extremity Continue anticoagulation.  B12 deficiency Recommend patient to start oral B12 daily.    Orders Placed This Encounter  Procedures   ANTIPHOSPHOLIPID SYNDROME PROF    Standing Status:   Future    Number of Occurrences:   1    Expected Date:   01/21/2024    Expiration Date:   01/20/2025   Beta-2-glycoprotein i abs, IgG/M/A    Standing Status:   Future    Number of Occurrences:   1    Expected Date:   01/21/2024    Expiration Date:   01/20/2025   CBC with Differential/Platelet    Standing Status:   Future    Number of Occurrences:   1    Expected Date:   01/21/2024    Expiration Date:   01/20/2025   Vitamin B12    Standing Status:   Future    Number of Occurrences:   1    Expected Date:   01/21/2024    Expiration Date:   01/20/2025   Folate    Standing Status:   Future    Number of Occurrences:   1    Expected Date:   01/21/2024    Expiration Date:   01/20/2025   Flow cytometry panel-leukemia/lymphoma work-up    Standing Status:   Future    Number of Occurrences:   1    Expected Date:   01/21/2024    Expiration Date:   01/20/2025   Immature Platelet Fraction    Standing Status:   Future     Number of Occurrences:   1    Expected Date:   01/21/2024    Expiration Date:   01/20/2025   Lactate dehydrogenase    Standing Status:   Future    Number of Occurrences:   1    Expected Date:   01/21/2024    Expiration Date:   01/20/2025   HIV Antibody (routine testing w rflx)    Standing Status:   Future    Number of Occurrences:   1    Expected Date:   01/21/2024    Expiration Date:   01/20/2025   Hepatitis panel, acute    Standing Status:   Future    Number of Occurrences:   1    Expected Date:   01/21/2024    Expiration Date:   01/20/2025   Protein electrophoresis, serum    Standing Status:   Future    Number of Occurrences:   1    Expected Date:   01/21/2024    Expiration Date:   01/20/2025   ANA w/Reflex    Standing Status:   Future    Number of Occurrences:   1    Expected Date:   01/21/2024    Expiration Date:   01/20/2025  CMP (Cancer Center only)    Standing Status:   Future    Number of Occurrences:   1    Expiration Date:   01/20/2025   Technologist smear review    Standing Status:   Future    Number of Occurrences:   1    Expected Date:   01/21/2024    Expiration Date:   01/20/2025    Clinical information::   smear low platelet    All questions were answered. The patient knows to call the clinic with any problems, questions or concerns.  Rickard Patience, MD, PhD [facility] Hematology Oncology 01/21/2024   HISTORY OF PRESENTING ILLNESS:   Alison Lopez is a  68 y.o.  female with PMH listed below was seen in consultation at the request of  Rayetta Humphrey, MD  for evaluation of thrombocytopenia.   Patient previously followed up by Dr. Smith Robert and was last seen in 2021. She lost follow up. Daughter prefers patient to reestablish care this week as she has flexible schedule this week. Dr. Smith Robert is off this week so patient presents to establish care with me.   Extensive medical records review was performed by me.  prior h/o LLE DVT back in 1993 after an episode of pneumonia.  Patient was started on coumadin back then. 04/20/2019 she developed extensive right calf, femoral and popliteal DVT despite therapeutic INR of 2.2.  Anticoagulation was switched to Eliquis.  Patient was also seen by vascular surgery and underwent thrombectomy and IVC filter placement.  06/21/2019 Hypercoagulable workup showed elevated anticardiolipin IgG antibody of 74.  Beta-2 IgG glycoprotein was elevated 48.  Lupus anticoagulant testing positive.  12/15/2019 repeat set of APS labs showed elevated anticardiolipin IgG 74, positive lupus anticoagulants, beta-2 glycoprotein IgG 42. This tests confirms that she has triple positive antiphospholipid syndrome.  Patient was kept on Eliquis for anticoagulation as she failed Coumadin.Marland Kitchen  Options of switching to Lovenox or Arixtra were discussed with patient and she declined at that time.  08/22/2019 negative JAK2 V617F mutation.  PNH profile negative  Thrombocytopenia, this is a chronic issue for her.  It was felt to be secondary to antiphospholipid syndrome, as well as history of lupus.  Due to being on chronic anticoagulation with low platelet count, a course of steroid [Decadron 40 mg daily for 4 days] was given which only mildly improved platelet count to 60,000-76,000's..   IVC filter was removed in 2023 Patient has a history of iron deficiency anemia Colonoscopy 06/2021-exudate/mucus at appendiceal orifice. Medium sized angioectasia ascending. Diverticulosis sigmoid descending ascending. Total of 9 small polyps throughout colon. Repeat 1 year for surveillance recommended. EGD 06/2021-grade a esophagitis. Gastritis.   Her daughter is her caregiver. She reports that patient has been experiencing memory issues for about three years, which have progressively worsened. She has been living with daughter for a couple of years, who manages her medications and daily care. Daughter is not clear if patient is currently taking Eliquis.  She will check her medication  list and update me.  MEDICAL HISTORY:  Past Medical History:  Diagnosis Date   Anxiety    Arthritis    Collagen vascular disease (HCC)    COPD (chronic obstructive pulmonary disease) (HCC)    DDD (degenerative disc disease), lumbar    Dementia (HCC)    Depression    Diabetes mellitus without complication (HCC)    DVT (deep venous thrombosis) (HCC)    Fibromyalgia    GERD (gastroesophageal reflux disease)  Graves' disease with exophthalmos    HLD (hyperlipidemia)    Hypertension    Hypothyroidism    Lupus    Osteoporosis     SURGICAL HISTORY: Past Surgical History:  Procedure Laterality Date   ABDOMINAL HYSTERECTOMY     COLONOSCOPY WITH PROPOFOL N/A 06/11/2021   Procedure: COLONOSCOPY WITH PROPOFOL;  Surgeon: Regis Bill, MD;  Location: [facility] ENDOSCOPY;  Service: Endoscopy;  Laterality: N/A;  prefers afternoon Eliquis   COLONOSCOPY WITH PROPOFOL N/A 12/19/2022   Procedure: COLONOSCOPY WITH PROPOFOL;  Surgeon: Regis Bill, MD;  Location: [facility] ENDOSCOPY;  Service: Endoscopy;  Laterality: N/A;   ESOPHAGOGASTRODUODENOSCOPY N/A 06/11/2021   Procedure: ESOPHAGOGASTRODUODENOSCOPY (EGD);  Surgeon: Regis Bill, MD;  Location: [facility] ENDOSCOPY;  Service: Endoscopy;  Laterality: N/A;   EYE SURGERY Right    IVC FILTER REMOVAL N/A 07/21/2019   Procedure: IVC FILTER REMOVAL;  Surgeon: Annice Needy, MD;  Location: [facility] INVASIVE CV LAB;  Service: Cardiovascular;  Laterality: N/A;   LOWER EXTREMITY INTERVENTION Right 04/22/2019   Procedure: IVC Filter Insertion with Right Lower Extremity Venous Lysis;  Surgeon: Annice Needy, MD;  Location: [facility] INVASIVE CV LAB;  Service: Cardiovascular;  Laterality: Right;   OOPHORECTOMY      SOCIAL HISTORY: Social History   Socioeconomic History   Marital status: Divorced    Spouse name: Not on file   Number of children: Not on file   Years of education: Not on file   Highest education level: Not on file  Occupational History    Not on file  Tobacco Use   Smoking status: Former    Current packs/day: 0.00    Average packs/day: 1 pack/day for 17.0 years (17.0 ttl pk-yrs)    Types: Cigarettes    Start date: 85    Quit date: 2002    Years since quitting: 23.2   Smokeless tobacco: Never  Vaping Use   Vaping status: Never Used  Substance and Sexual Activity   Alcohol use: Not Currently   Drug use: Never   Sexual activity: Not Currently    Birth control/protection: None  Other Topics Concern   Not on file  Social History Narrative   Not on file   Social Drivers of Health   Financial Resource Strain: Not on file  Food Insecurity: No Food Insecurity (01/21/2024)   Hunger Vital Sign    Worried About Running Out of Food in the Last Year: Never true    Ran Out of Food in the Last Year: Never true  Transportation Needs: No Transportation Needs (01/21/2024)   PRAPARE - Administrator, Civil Service (Medical): No    Lack of Transportation (Non-Medical): No  Physical Activity: Not on file  Stress: Not on file  Social Connections: Not on file  Intimate Partner Violence: Not At Risk (01/21/2024)   Humiliation, Afraid, Rape, and Kick questionnaire    Fear of Current or Ex-Partner: No    Emotionally Abused: No    Physically Abused: No    Sexually Abused: No    FAMILY HISTORY: Family History  Problem Relation Age of Onset   Stomach cancer Mother    Heart attack Father    Breast cancer Neg Hx     ALLERGIES:  is allergic to morphine and codeine; montelukast; antihistamines, diphenhydramine-type; aspirin; benadryl [diphenhydramine hcl]; and codeine.  MEDICATIONS:  Current Outpatient Medications  Medication Sig Dispense Refill   acetaminophen (TYLENOL) 650 MG CR tablet Take 650 mg by mouth every 8 (eight)  hours as needed for pain.     albuterol (PROVENTIL) (2.5 MG/3ML) 0.083% nebulizer solution Take 2.5 mg by nebulization every 4 (four) hours as needed for wheezing or shortness of breath.       albuterol (VENTOLIN HFA) 108 (90 Base) MCG/ACT inhaler Inhale 2 puffs into the lungs every 4 (four) hours as needed for wheezing or shortness of breath.     ascorbic acid (VITAMIN C) 100 MG tablet Take 1,000 mg by mouth daily.     buPROPion (WELLBUTRIN SR) 150 MG 12 hr tablet Take 150 mg by mouth daily.     donepezil (ARICEPT) 10 MG tablet Take 10 mg by mouth at bedtime.     ergocalciferol (VITAMIN D2) 1.25 MG (50000 UT) capsule Take 50,000 Units by mouth every Friday.     levothyroxine (SYNTHROID, LEVOTHROID) 88 MCG tablet Take 75 mcg by mouth daily before breakfast.      losartan (COZAAR) 50 MG tablet Take 50 mg by mouth daily. prn     memantine (NAMENDA) 5 MG tablet Take 5 mg by mouth daily.     mometasone (NASONEX) 50 MCG/ACT nasal spray Place 2 sprays into the nose daily.     sertraline (ZOLOFT) 25 MG tablet Take 25 mg by mouth daily.     simvastatin (ZOCOR) 40 MG tablet Take 40 mg by mouth daily.     apixaban (ELIQUIS) 5 MG TABS tablet Take 1 tablet (5 mg total) by mouth 2 (two) times daily. (Patient not taking: Reported on 01/21/2024) 60 tablet 2   atropine 1 % ophthalmic solution Place 1 drop into the right eye in the morning and at bedtime. (Patient not taking: Reported on 01/21/2024)     brimonidine-timolol (COMBIGAN) 0.2-0.5 % ophthalmic solution Place 3 drops into both eyes every 12 (twelve) hours. (Patient not taking: Reported on 01/21/2024)     Brinzolamide-Brimonidine 1-0.2 % SUSP Apply 2.5 mLs to eye 1 day or 1 dose. (Patient not taking: Reported on 01/21/2024)     cyclobenzaprine (FLEXERIL) 5 MG tablet Take 5 mg by mouth 3 (three) times daily as needed for muscle spasms. (Patient not taking: Reported on 01/21/2024)     DULoxetine (CYMBALTA) 20 MG capsule Take 1 capsule by mouth 1 day or 1 dose. (Patient not taking: Reported on 01/21/2024)     Efinaconazole (JUBLIA) 10 % SOLN Apply topically daily. (Patient not taking: Reported on 01/21/2024)     erythromycin ophthalmic ointment Place 1  application into both eyes at bedtime. (Patient not taking: Reported on 01/21/2024)     esomeprazole (NEXIUM) 40 MG capsule Take by mouth. (Patient not taking: Reported on 01/21/2024)     ferrous sulfate 325 (65 FE) MG tablet Take by mouth. (Patient not taking: Reported on 08/03/2020)     fluconazole (DIFLUCAN) 150 MG tablet Take 1 tablet (150 mg total) by mouth daily. (Patient not taking: Reported on 01/21/2024) 1 tablet 0   furosemide (LASIX) 20 MG tablet Take 1 tablet by mouth daily as needed. (Patient not taking: Reported on 01/21/2024)     metFORMIN (GLUCOPHAGE) 500 MG tablet Take 500 mg by mouth daily.      neomycin-bacitracin-polymyxin (NEOSPORIN) ophthalmic ointment Place 1 application into both eyes at bedtime. (Patient not taking: Reported on 01/21/2024)     prednisoLONE acetate (PRED FORTE) 1 % ophthalmic suspension Place 1 drop into both eyes 1 day or 1 dose. (Patient not taking: Reported on 01/21/2024)     predniSONE (DELTASONE) 10 MG tablet Take 5 mg by mouth  daily with breakfast.  (Patient not taking: Reported on 01/21/2024)     SYMBICORT 160-4.5 MCG/ACT inhaler  (Patient not taking: Reported on 01/21/2024)     No current facility-administered medications for this visit.    Review of Systems  Unable to perform ROS: Dementia   PHYSICAL EXAMINATION: ECOG PERFORMANCE STATUS: 2 - Symptomatic, <50% confined to bed Vitals:   01/21/24 1000  BP: 135/78  Pulse: (!) 54  Resp: 16  Temp: 98.1 F (36.7 C)  SpO2: 98%   Filed Weights   01/21/24 1000  Weight: 114 lb (51.7 kg)    Physical Exam Constitutional:      General: She is not in acute distress.    Comments: Patient needs assistance with walking  HENT:     Head: Normocephalic and atraumatic.  Eyes:     General: No scleral icterus. Cardiovascular:     Rate and Rhythm: Normal rate and regular rhythm.  Pulmonary:     Effort: Pulmonary effort is normal. No respiratory distress.     Breath sounds: No wheezing.  Abdominal:      General: Bowel sounds are normal. There is no distension.     Palpations: Abdomen is soft.  Musculoskeletal:        General: No deformity. Normal range of motion.     Cervical back: Normal range of motion and neck supple.  Skin:    Findings: No erythema or rash.  Neurological:     Mental Status: She is alert. Mental status is at baseline.  Psychiatric:        Mood and Affect: Mood normal.    LABORATORY DATA:  I have reviewed the data as listed    Latest Ref Rng & Units 01/21/2024   10:40 AM 12/16/2023    8:49 PM 01/28/2021    2:11 PM  CBC  WBC 4.0 - 10.5 K/uL 6.2  6.8  8.2   Hemoglobin 12.0 - 15.0 g/dL 19.1  47.8  9.2   Hematocrit 36.0 - 46.0 % 37.1  41.3  29.8   Platelets 150 - 400 K/uL 64  57  53       Latest Ref Rng & Units 01/21/2024   10:40 AM 12/16/2023    8:49 PM 07/01/2021    2:35 PM  CMP  Glucose 70 - 99 mg/dL 97  295    BUN 8 - 23 mg/dL 17  15    Creatinine 6.21 - 1.00 mg/dL 3.08  6.57  8.46   Sodium 135 - 145 mmol/L 139  138    Potassium 3.5 - 5.1 mmol/L 4.1  4.0    Chloride 98 - 111 mmol/L 107  104    CO2 22 - 32 mmol/L 24  25    Calcium 8.9 - 10.3 mg/dL 8.7  9.0    Total Protein 6.5 - 8.1 g/dL 7.5     Total Bilirubin 0.0 - 1.2 mg/dL 0.8     Alkaline Phos 38 - 126 U/L 44     AST 15 - 41 U/L 17     ALT 0 - 44 U/L 9         RADIOGRAPHIC STUDIES: I have personally reviewed the radiological images as listed and agreed with the findings in the report. No results found.

## 2024-01-21 NOTE — Assessment & Plan Note (Signed)
 Previously failed Coumadin.  Clinically she is doing well on anticoagulation with Eliquis. Recommend patient to continue Eliquis 5 mg twice daily.  Daughter will confirm if patient is taking meds. I will repeat APS labs

## 2024-01-21 NOTE — Progress Notes (Signed)
 Last seen in clinic in 2021.  Since last visit patient's dementia has worsened and is accompanied with daughter for visit today.

## 2024-01-21 NOTE — Assessment & Plan Note (Signed)
 Continue anticoagulation

## 2024-01-22 ENCOUNTER — Telehealth: Payer: Self-pay

## 2024-01-22 ENCOUNTER — Other Ambulatory Visit: Payer: Self-pay

## 2024-01-22 LAB — BETA-2-GLYCOPROTEIN I ABS, IGG/M/A
Beta-2 Glyco I IgG: 121 GPI IgG units — ABNORMAL HIGH (ref 0–20)
Beta-2-Glycoprotein I IgA: 54 GPI IgA units — ABNORMAL HIGH (ref 0–25)
Beta-2-Glycoprotein I IgM: 29 GPI IgM units (ref 0–32)

## 2024-01-22 LAB — ENA+DNA/DS+SJORGEN'S
ENA SM Ab Ser-aCnc: <0.2 AI (ref 0.0–0.9)
Ribonucleic Protein: <0.2 AI (ref 0.0–0.9)
SSA (Ro) (ENA) Antibody, IgG: <0.2 AI (ref 0.0–0.9)
SSB (La) (ENA) Antibody, IgG: <0.2 AI (ref 0.0–0.9)
ds DNA Ab: 42 [IU]/mL — ABNORMAL HIGH (ref 0–9)

## 2024-01-22 LAB — ANA W/REFLEX: Anti Nuclear Antibody (ANA): POSITIVE — AB

## 2024-01-22 MED ORDER — VITAMIN B-12 1000 MCG PO TABS: 1000.0000 ug | ORAL_TABLET | Freq: Every day | ORAL | 1 refills | Status: DC

## 2024-01-22 NOTE — Telephone Encounter (Signed)
 Spoke to pt's daughter Hospital doctor and informed her of MD recommendation regarding B12 supplement and follow up. B12 supplement resent to [facility] per Amber's request.  Pt will call back after 3 to set up appt:   MD 2-3 weeks

## 2024-01-22 NOTE — Telephone Encounter (Signed)
-----   Message from Rickard Patience sent at 01/21/2024 10:10 PM EDT ----- Please let patient and daughter know that her B12 level is low. I recommend patient to start B12 daily. I have sent Rx to [facility] mail delivery.

## 2024-01-23 LAB — ANTIPHOSPHOLIPID SYNDROME PROF
Anticardiolipin IgG: 107 GPL U/mL — ABNORMAL HIGH (ref 0–14)
Anticardiolipin IgM: 35 [MPL'U]/mL — ABNORMAL HIGH (ref 0–12)
DRVVT: 72.4 s — ABNORMAL HIGH (ref 0.0–47.0)
PTT Lupus Anticoagulant: 62.3 s — ABNORMAL HIGH (ref 0.0–43.5)

## 2024-01-23 LAB — HEXAGONAL PHASE PHOSPHOLIPID: Hexagonal Phase Phospholipid: 41 s — ABNORMAL HIGH (ref 0–11)

## 2024-01-23 LAB — DRVVT MIX: dRVVT Mix: 59.4 s — ABNORMAL HIGH (ref 0.0–40.4)

## 2024-01-23 LAB — PTT-LA MIX: PTT-LA Mix: 55.2 s — ABNORMAL HIGH (ref 0.0–40.5)

## 2024-01-23 LAB — DRVVT CONFIRM: dRVVT Confirm: 1.4 ratio — ABNORMAL HIGH (ref 0.8–1.2)

## 2024-01-25 ENCOUNTER — Telehealth: Payer: Self-pay

## 2024-01-25 LAB — COMP PANEL: LEUKEMIA/LYMPHOMA

## 2024-01-25 LAB — PROTEIN ELECTROPHORESIS, SERUM
A/G Ratio: 1.2 (ref 0.7–1.7)
Albumin ELP: 3.8 g/dL (ref 2.9–4.4)
Alpha-1-Globulin: 0.2 g/dL (ref 0.0–0.4)
Alpha-2-Globulin: 0.6 g/dL (ref 0.4–1.0)
Beta Globulin: 0.9 g/dL (ref 0.7–1.3)
Gamma Globulin: 1.3 g/dL (ref 0.4–1.8)
Globulin, Total: 3.1 g/dL (ref 2.2–3.9)
Total Protein ELP: 6.9 g/dL (ref 6.0–8.5)

## 2024-01-25 NOTE — Telephone Encounter (Signed)
 Spoke to pt's daughter Joice Lofts and she states pt has been off of Eliquis for a while now, she wants to know if she should be on it? If so, she needs a prescription for it.   Ash, please schedule MD on April 7 @ 11am. Amber aware of appt details.

## 2024-01-25 NOTE — Telephone Encounter (Signed)
-----   Message from Rickard Patience sent at 01/24/2024  4:02 PM EDT ----- Please confirm with patient's daughter if she is taking Eliquis, what dosage. Thanks.

## 2024-01-26 ENCOUNTER — Encounter: Payer: Self-pay | Admitting: Oncology

## 2024-01-26 ENCOUNTER — Inpatient Hospital Stay (HOSPITAL_BASED_OUTPATIENT_CLINIC_OR_DEPARTMENT_OTHER): Admitting: Oncology

## 2024-01-26 VITALS — BP 118/66 | HR 51 | Temp 97.8°F | Resp 18

## 2024-01-26 DIAGNOSIS — E119 Type 2 diabetes mellitus without complications: Secondary | ICD-10-CM

## 2024-01-26 DIAGNOSIS — D6861 Antiphospholipid syndrome: Secondary | ICD-10-CM

## 2024-01-26 DIAGNOSIS — R768 Other specified abnormal immunological findings in serum: Secondary | ICD-10-CM | POA: Diagnosis not present

## 2024-01-26 DIAGNOSIS — D696 Thrombocytopenia, unspecified: Secondary | ICD-10-CM | POA: Diagnosis not present

## 2024-01-26 DIAGNOSIS — E538 Deficiency of other specified B group vitamins: Secondary | ICD-10-CM

## 2024-01-26 DIAGNOSIS — Z86718 Personal history of other venous thrombosis and embolism: Secondary | ICD-10-CM

## 2024-01-26 MED ORDER — APIXABAN 5 MG PO TABS: 5.0000 mg | ORAL_TABLET | Freq: Two times a day (BID) | ORAL | 2 refills | Status: DC

## 2024-01-26 NOTE — Assessment & Plan Note (Signed)
 Eliquis 5mg  BID for anticoagulation.

## 2024-01-26 NOTE — Progress Notes (Signed)
 Hematology/Oncology Progress note [phone] [phone]           REFERRING PROVIDER: Rayetta Humphrey, MD   CHIEF COMPLAINTS/REASON FOR VISIT:  Evaluation of thrombocytopenia, antiphospholipid syndrome   ASSESSMENT & PLAN:   Thrombocytopenia (HCC) Chronic thrombocytopenia, platelet count appears at her baseline. 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  B12 deficiency Normal B12, folate, no M protein on SPEP.  Currently count is >50,000, recommend observation.  If count drops below 50,000, consider steroid treatment. .     Antiphospholipid antibody syndrome (HCC) Previously failed Coumadin.   Repeat APS showed persistently positive anticardiolipin IgG antibody, beta-2 IgG glycoprotein, lupus anticoagulant testing positive. Recommend patient to to resume Eliquis 5 mg twice daily.  Rx sent to pharmacy.  She has positive ANA and dsDNA, refer to rheumatology  History of DVT of lower extremity Eliquis 5mg  BID for anticoagulation.  B12 deficiency Recommend patient to start oral B12 daily.     Orders Placed This Encounter  Procedures   Immature Platelet Fraction    Standing Status:   Future    Expected Date:   02/26/2024    Expiration Date:   01/25/2025   CMP (Cancer Center only)    Standing Status:   Future    Expected Date:   02/26/2024    Expiration Date:   01/25/2025   CBC with Differential (Cancer Center Only)    Standing Status:   Future    Expected Date:   02/26/2024    Expiration Date:   01/25/2025   Ambulatory referral to Podiatry    Referral Priority:   Routine    Referral Type:   Consultation    Referral Reason:   Specialty Services Required    Requested Specialty:   Podiatry    Number of Visits Requested:   1   Ambulatory referral to Rheumatology    Referral Priority:   Routine    Referral Type:   Consultation    Referral Reason:   Specialty Services Required    Requested Specialty:    Rheumatology    Number of Visits Requested:   1   Follow up in 1 month All questions were answered. The patient knows to call the clinic with any problems, questions or concerns.  Rickard Patience, MD, PhD [facility] Hematology Oncology 01/26/2024   HISTORY OF PRESENTING ILLNESS:   LORRAINE CIMMINO is a  68 y.o.  female with PMH listed below was seen in consultation at the request of  Rayetta Humphrey, MD  for evaluation of thrombocytopenia.   Patient previously followed up by Dr. Smith Robert and was last seen in 2021. She lost follow up. Daughter prefers patient to reestablish care this week as she has flexible schedule this week. Dr. Smith Robert is off this week so patient presents to establish care with me.   Extensive medical records review was performed by me.  prior h/o LLE DVT back in 1993 after an episode of pneumonia. Patient was started on coumadin back then. 04/20/2019 she developed extensive right calf, femoral and popliteal DVT despite therapeutic INR of 2.2.  Anticoagulation was switched to Eliquis.  Patient was also seen by vascular surgery and underwent thrombectomy and IVC filter placement.  06/21/2019 Hypercoagulable workup showed elevated anticardiolipin IgG antibody of 74.  Beta-2 IgG glycoprotein was elevated 48.  Lupus anticoagulant testing positive.  12/15/2019 repeat set of APS labs showed elevated anticardiolipin IgG 74, positive lupus anticoagulants, beta-2 glycoprotein IgG 42. This tests  confirms that she has triple positive antiphospholipid syndrome.  Patient was kept on Eliquis for anticoagulation as she failed Coumadin.Marland Kitchen  Options of switching to Lovenox or Arixtra were discussed with patient and she declined at that time.  08/22/2019 negative JAK2 V617F mutation.  PNH profile negative  Thrombocytopenia, this is a chronic issue for her.  It was felt to be secondary to antiphospholipid syndrome,  Due to being on chronic anticoagulation with low platelet count, a course of steroid [Decadron  40 mg daily for 4 days] was given which only mildly improved platelet count to 60,000-76,000's..   IVC filter was removed in 2023 Patient has a history of iron deficiency anemia Colonoscopy 06/2021-exudate/mucus at appendiceal orifice. Medium sized angioectasia ascending. Diverticulosis sigmoid descending ascending. Total of 9 small polyps throughout colon. Repeat 1 year for surveillance recommended. EGD 06/2021-grade a esophagitis. Gastritis.   Her daughter is her caregiver. She reports that patient has been experiencing memory issues for about three years, which have progressively worsened. She has been living with daughter for a couple of years, who manages her medications and daily care. Daughter is not clear if patient is currently taking Eliquis.  She will check her medication list and update me.   INTERVAL HISTORY SHAQUAYLA KLIMAS is a 68 y.o. female who has above history reviewed by me today presents for follow up visit for APS syndrome thrombocytopenia No new complaints. Daughter confirmed that patient has not been on Elquis therapy currently. Unclear how long she has been off anticoagulation.    MEDICAL HISTORY:  Past Medical History:  Diagnosis Date   Anxiety    Arthritis    Collagen vascular disease (HCC)    COPD (chronic obstructive pulmonary disease) (HCC)    DDD (degenerative disc disease), lumbar    Dementia (HCC)    Depression    Diabetes mellitus without complication (HCC)    DVT (deep venous thrombosis) (HCC)    Fibromyalgia    GERD (gastroesophageal reflux disease)    Graves' disease with exophthalmos    HLD (hyperlipidemia)    Hypertension    Hypothyroidism    Lupus    Osteoporosis     SURGICAL HISTORY: Past Surgical History:  Procedure Laterality Date   ABDOMINAL HYSTERECTOMY     COLONOSCOPY WITH PROPOFOL N/A 06/11/2021   Procedure: COLONOSCOPY WITH PROPOFOL;  Surgeon: Regis Bill, MD;  Location: [facility] ENDOSCOPY;  Service: Endoscopy;  Laterality:  N/A;  prefers afternoon Eliquis   COLONOSCOPY WITH PROPOFOL N/A 12/19/2022   Procedure: COLONOSCOPY WITH PROPOFOL;  Surgeon: Regis Bill, MD;  Location: [facility] ENDOSCOPY;  Service: Endoscopy;  Laterality: N/A;   ESOPHAGOGASTRODUODENOSCOPY N/A 06/11/2021   Procedure: ESOPHAGOGASTRODUODENOSCOPY (EGD);  Surgeon: Regis Bill, MD;  Location: [facility] ENDOSCOPY;  Service: Endoscopy;  Laterality: N/A;   EYE SURGERY Right    IVC FILTER REMOVAL N/A 07/21/2019   Procedure: IVC FILTER REMOVAL;  Surgeon: Annice Needy, MD;  Location: [facility] INVASIVE CV LAB;  Service: Cardiovascular;  Laterality: N/A;   LOWER EXTREMITY INTERVENTION Right 04/22/2019   Procedure: IVC Filter Insertion with Right Lower Extremity Venous Lysis;  Surgeon: Annice Needy, MD;  Location: [facility] INVASIVE CV LAB;  Service: Cardiovascular;  Laterality: Right;   OOPHORECTOMY      SOCIAL HISTORY: Social History   Socioeconomic History   Marital status: Divorced    Spouse name: Not on file   Number of children: Not on file   Years of education: Not on file   Highest education level: Not  on file  Occupational History   Not on file  Tobacco Use   Smoking status: Former    Current packs/day: 0.00    Average packs/day: 1 pack/day for 17.0 years (17.0 ttl pk-yrs)    Types: Cigarettes    Start date: 89    Quit date: 2002    Years since quitting: 23.2   Smokeless tobacco: Never  Vaping Use   Vaping status: Never Used  Substance and Sexual Activity   Alcohol use: Not Currently   Drug use: Never   Sexual activity: Not Currently    Birth control/protection: None  Other Topics Concern   Not on file  Social History Narrative   Not on file   Social Drivers of Health   Financial Resource Strain: Not on file  Food Insecurity: No Food Insecurity (01/21/2024)   Hunger Vital Sign    Worried About Running Out of Food in the Last Year: Never true    Ran Out of Food in the Last Year: Never true  Transportation Needs: No  Transportation Needs (01/21/2024)   PRAPARE - Administrator, Civil Service (Medical): No    Lack of Transportation (Non-Medical): No  Physical Activity: Not on file  Stress: Not on file  Social Connections: Not on file  Intimate Partner Violence: Not At Risk (01/21/2024)   Humiliation, Afraid, Rape, and Kick questionnaire    Fear of Current or Ex-Partner: No    Emotionally Abused: No    Physically Abused: No    Sexually Abused: No    FAMILY HISTORY: Family History  Problem Relation Age of Onset   Stomach cancer Mother    Heart attack Father    Breast cancer Neg Hx     ALLERGIES:  is allergic to morphine and codeine; montelukast; antihistamines, diphenhydramine-type; aspirin; benadryl [diphenhydramine hcl]; and codeine.  MEDICATIONS:  Current Outpatient Medications  Medication Sig Dispense Refill   ascorbic acid (VITAMIN C) 100 MG tablet Take 1,000 mg by mouth daily.     buPROPion (WELLBUTRIN SR) 150 MG 12 hr tablet Take 150 mg by mouth daily.     cyanocobalamin (VITAMIN B12) 1000 MCG tablet Take 1 tablet (1,000 mcg total) by mouth daily. 90 tablet 1   donepezil (ARICEPT) 10 MG tablet Take 10 mg by mouth at bedtime.     ergocalciferol (VITAMIN D2) 1.25 MG (50000 UT) capsule Take 50,000 Units by mouth every Friday.     ferrous sulfate 325 (65 FE) MG tablet Take by mouth.     levothyroxine (SYNTHROID, LEVOTHROID) 88 MCG tablet Take 75 mcg by mouth daily before breakfast.      losartan (COZAAR) 50 MG tablet Take 50 mg by mouth daily. prn     memantine (NAMENDA) 5 MG tablet Take 5 mg by mouth daily.     metFORMIN (GLUCOPHAGE) 500 MG tablet Take 500 mg by mouth daily.      sertraline (ZOLOFT) 25 MG tablet Take 25 mg by mouth daily.     simvastatin (ZOCOR) 40 MG tablet Take 40 mg by mouth daily.     acetaminophen (TYLENOL) 650 MG CR tablet Take 650 mg by mouth every 8 (eight) hours as needed for pain. (Patient not taking: Reported on 01/26/2024)     albuterol (PROVENTIL)  (2.5 MG/3ML) 0.083% nebulizer solution Take 2.5 mg by nebulization every 4 (four) hours as needed for wheezing or shortness of breath.  (Patient not taking: Reported on 01/26/2024)     albuterol (VENTOLIN HFA) 108 (90 Base)  MCG/ACT inhaler Inhale 2 puffs into the lungs every 4 (four) hours as needed for wheezing or shortness of breath. (Patient not taking: Reported on 01/26/2024)     apixaban (ELIQUIS) 5 MG TABS tablet Take 1 tablet (5 mg total) by mouth 2 (two) times daily. 60 tablet 2   cyclobenzaprine (FLEXERIL) 5 MG tablet Take 5 mg by mouth 3 (three) times daily as needed for muscle spasms. (Patient not taking: Reported on 01/26/2024)     No current facility-administered medications for this visit.    Review of Systems  Unable to perform ROS: Dementia   PHYSICAL EXAMINATION: ECOG PERFORMANCE STATUS: 2 - Symptomatic, <50% confined to bed Vitals:   01/26/24 1101  BP: 118/66  Pulse: (!) 51  Resp: 18  Temp: 97.8 F (36.6 C)   Filed Weights    Physical Exam Constitutional:      General: She is not in acute distress.    Comments: Patient needs assistance with walking  HENT:     Head: Normocephalic and atraumatic.  Eyes:     General: No scleral icterus. Cardiovascular:     Rate and Rhythm: Normal rate and regular rhythm.  Pulmonary:     Effort: Pulmonary effort is normal. No respiratory distress.     Breath sounds: No wheezing.  Abdominal:     General: Bowel sounds are normal. There is no distension.     Palpations: Abdomen is soft.  Musculoskeletal:        General: No deformity. Normal range of motion.     Cervical back: Normal range of motion and neck supple.  Skin:    Findings: No erythema or rash.  Neurological:     Mental Status: She is alert. Mental status is at baseline.  Psychiatric:        Mood and Affect: Mood normal.     LABORATORY DATA:  I have reviewed the data as listed    Latest Ref Rng & Units 01/21/2024   10:40 AM 12/16/2023    8:49 PM 01/28/2021     2:11 PM  CBC  WBC 4.0 - 10.5 K/uL 6.2  6.8  8.2   Hemoglobin 12.0 - 15.0 g/dL 03.4  74.2  9.2   Hematocrit 36.0 - 46.0 % 37.1  41.3  29.8   Platelets 150 - 400 K/uL 64  57  53       Latest Ref Rng & Units 01/21/2024   10:40 AM 12/16/2023    8:49 PM 07/01/2021    2:35 PM  CMP  Glucose 70 - 99 mg/dL 97  595    BUN 8 - 23 mg/dL 17  15    Creatinine 6.38 - 1.00 mg/dL 7.56  4.33  2.95   Sodium 135 - 145 mmol/L 139  138    Potassium 3.5 - 5.1 mmol/L 4.1  4.0    Chloride 98 - 111 mmol/L 107  104    CO2 22 - 32 mmol/L 24  25    Calcium 8.9 - 10.3 mg/dL 8.7  9.0    Total Protein 6.5 - 8.1 g/dL 7.5     Total Bilirubin 0.0 - 1.2 mg/dL 0.8     Alkaline Phos 38 - 126 U/L 44     AST 15 - 41 U/L 17     ALT 0 - 44 U/L 9         RADIOGRAPHIC STUDIES: I have personally reviewed the radiological images as listed and agreed with the findings in the report. No  results found.

## 2024-01-26 NOTE — Assessment & Plan Note (Addendum)
 Previously failed Coumadin.   Repeat APS showed persistently positive anticardiolipin IgG antibody, beta-2 IgG glycoprotein, lupus anticoagulant testing positive. Recommend patient to to resume Eliquis 5 mg twice daily.  Rx sent to pharmacy.  Rationale and risk of anticoagulation was reviewed with patient and her daughter. They agree with the plan.  She has positive ANA and dsDNA, refer to rheumatology

## 2024-01-26 NOTE — Assessment & Plan Note (Addendum)
 Chronic thrombocytopenia, platelet count appears at her baseline. 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  B12 deficiency Normal B12, folate, no M protein on SPEP.  Currently count is >50,000, recommend observation.  If count drops below 50,000, consider steroid treatment. Alison Lopez

## 2024-01-26 NOTE — Assessment & Plan Note (Signed)
 Recommend patient to start oral B12 daily.

## 2024-01-27 ENCOUNTER — Telehealth: Payer: Self-pay

## 2024-01-27 NOTE — Telephone Encounter (Signed)
 Rheumatology referral faxed to [facility] rheum. Fax confirmation received.   Re: Positive ANA, dsDNA  Ph: [phone] Fax: [phone]

## 2024-01-29 ENCOUNTER — Inpatient Hospital Stay

## 2024-01-29 ENCOUNTER — Emergency Department

## 2024-01-29 ENCOUNTER — Inpatient Hospital Stay
Admission: EM | Admit: 2024-01-29 | Discharge: 2024-02-08 | DRG: 025 | Disposition: A | Discharge status: Rehabilitation Facility | Attending: Internal Medicine | Admitting: Internal Medicine

## 2024-01-29 ENCOUNTER — Telehealth: Payer: Self-pay

## 2024-01-29 ENCOUNTER — Other Ambulatory Visit: Payer: Self-pay

## 2024-01-29 DIAGNOSIS — E119 Type 2 diabetes mellitus without complications: Secondary | ICD-10-CM | POA: Diagnosis present

## 2024-01-29 DIAGNOSIS — K5900 Constipation, unspecified: Secondary | ICD-10-CM | POA: Diagnosis present

## 2024-01-29 DIAGNOSIS — I10 Essential (primary) hypertension: Secondary | ICD-10-CM | POA: Diagnosis present

## 2024-01-29 DIAGNOSIS — F0394 Unspecified dementia, unspecified severity, with anxiety: Secondary | ICD-10-CM | POA: Diagnosis present

## 2024-01-29 DIAGNOSIS — Z6824 Body mass index (BMI) 24.0-24.9, adult: Secondary | ICD-10-CM

## 2024-01-29 DIAGNOSIS — D696 Thrombocytopenia, unspecified: Secondary | ICD-10-CM | POA: Diagnosis present

## 2024-01-29 DIAGNOSIS — Z90710 Acquired absence of both cervix and uterus: Secondary | ICD-10-CM

## 2024-01-29 DIAGNOSIS — S065XAA Traumatic subdural hemorrhage with loss of consciousness status unknown, initial encounter: Secondary | ICD-10-CM

## 2024-01-29 DIAGNOSIS — Z79890 Hormone replacement therapy (postmenopausal): Secondary | ICD-10-CM | POA: Diagnosis not present

## 2024-01-29 DIAGNOSIS — M810 Age-related osteoporosis without current pathological fracture: Secondary | ICD-10-CM | POA: Diagnosis present

## 2024-01-29 DIAGNOSIS — Z885 Allergy status to narcotic agent status: Secondary | ICD-10-CM

## 2024-01-29 DIAGNOSIS — Z7984 Long term (current) use of oral hypoglycemic drugs: Secondary | ICD-10-CM | POA: Diagnosis not present

## 2024-01-29 DIAGNOSIS — R001 Bradycardia, unspecified: Secondary | ICD-10-CM | POA: Diagnosis present

## 2024-01-29 DIAGNOSIS — G8191 Hemiplegia, unspecified affecting right dominant side: Secondary | ICD-10-CM | POA: Diagnosis present

## 2024-01-29 DIAGNOSIS — Z86718 Personal history of other venous thrombosis and embolism: Secondary | ICD-10-CM | POA: Diagnosis not present

## 2024-01-29 DIAGNOSIS — I6203 Nontraumatic chronic subdural hemorrhage: Secondary | ICD-10-CM | POA: Diagnosis present

## 2024-01-29 DIAGNOSIS — D693 Immune thrombocytopenic purpura: Secondary | ICD-10-CM | POA: Diagnosis not present

## 2024-01-29 DIAGNOSIS — E785 Hyperlipidemia, unspecified: Secondary | ICD-10-CM | POA: Diagnosis present

## 2024-01-29 DIAGNOSIS — R5383 Other fatigue: Secondary | ICD-10-CM | POA: Diagnosis not present

## 2024-01-29 DIAGNOSIS — Z95828 Presence of other vascular implants and grafts: Secondary | ICD-10-CM | POA: Diagnosis not present

## 2024-01-29 DIAGNOSIS — R471 Dysarthria and anarthria: Secondary | ICD-10-CM | POA: Diagnosis present

## 2024-01-29 DIAGNOSIS — Z888 Allergy status to other drugs, medicaments and biological substances status: Secondary | ICD-10-CM

## 2024-01-29 DIAGNOSIS — D62 Acute posthemorrhagic anemia: Secondary | ICD-10-CM | POA: Diagnosis not present

## 2024-01-29 DIAGNOSIS — Z794 Long term (current) use of insulin: Secondary | ICD-10-CM | POA: Diagnosis not present

## 2024-01-29 DIAGNOSIS — F32A Depression, unspecified: Secondary | ICD-10-CM | POA: Diagnosis present

## 2024-01-29 DIAGNOSIS — J449 Chronic obstructive pulmonary disease, unspecified: Secondary | ICD-10-CM | POA: Diagnosis present

## 2024-01-29 DIAGNOSIS — G935 Compression of brain: Secondary | ICD-10-CM | POA: Diagnosis present

## 2024-01-29 DIAGNOSIS — F0390 Unspecified dementia without behavioral disturbance: Secondary | ICD-10-CM | POA: Insufficient documentation

## 2024-01-29 DIAGNOSIS — K921 Melena: Secondary | ICD-10-CM | POA: Diagnosis not present

## 2024-01-29 DIAGNOSIS — E039 Hypothyroidism, unspecified: Secondary | ICD-10-CM | POA: Diagnosis present

## 2024-01-29 DIAGNOSIS — Z8249 Family history of ischemic heart disease and other diseases of the circulatory system: Secondary | ICD-10-CM

## 2024-01-29 DIAGNOSIS — Z87891 Personal history of nicotine dependence: Secondary | ICD-10-CM

## 2024-01-29 DIAGNOSIS — E11319 Type 2 diabetes mellitus with unspecified diabetic retinopathy without macular edema: Secondary | ICD-10-CM | POA: Diagnosis not present

## 2024-01-29 DIAGNOSIS — R64 Cachexia: Secondary | ICD-10-CM | POA: Diagnosis present

## 2024-01-29 DIAGNOSIS — D6861 Antiphospholipid syndrome: Secondary | ICD-10-CM | POA: Diagnosis present

## 2024-01-29 DIAGNOSIS — F0393 Unspecified dementia, unspecified severity, with mood disturbance: Secondary | ICD-10-CM | POA: Diagnosis present

## 2024-01-29 DIAGNOSIS — S32020A Wedge compression fracture of second lumbar vertebra, initial encounter for closed fracture: Secondary | ICD-10-CM | POA: Diagnosis present

## 2024-01-29 DIAGNOSIS — Z800 Family history of malignant neoplasm of digestive organs: Secondary | ICD-10-CM

## 2024-01-29 DIAGNOSIS — R29707 NIHSS score 7: Secondary | ICD-10-CM | POA: Diagnosis present

## 2024-01-29 DIAGNOSIS — Z227 Latent tuberculosis: Secondary | ICD-10-CM | POA: Diagnosis present

## 2024-01-29 DIAGNOSIS — M069 Rheumatoid arthritis, unspecified: Secondary | ICD-10-CM | POA: Diagnosis present

## 2024-01-29 DIAGNOSIS — H53461 Homonymous bilateral field defects, right side: Secondary | ICD-10-CM | POA: Diagnosis not present

## 2024-01-29 DIAGNOSIS — Z886 Allergy status to analgesic agent status: Secondary | ICD-10-CM

## 2024-01-29 DIAGNOSIS — Z79899 Other long term (current) drug therapy: Secondary | ICD-10-CM

## 2024-01-29 DIAGNOSIS — R509 Fever, unspecified: Secondary | ICD-10-CM | POA: Diagnosis not present

## 2024-01-29 DIAGNOSIS — Z7901 Long term (current) use of anticoagulants: Secondary | ICD-10-CM | POA: Diagnosis not present

## 2024-01-29 DIAGNOSIS — M797 Fibromyalgia: Secondary | ICD-10-CM | POA: Diagnosis present

## 2024-01-29 DIAGNOSIS — K219 Gastro-esophageal reflux disease without esophagitis: Secondary | ICD-10-CM | POA: Diagnosis present

## 2024-01-29 DIAGNOSIS — R29716 NIHSS score 16: Secondary | ICD-10-CM | POA: Diagnosis not present

## 2024-01-29 DIAGNOSIS — A499 Bacterial infection, unspecified: Secondary | ICD-10-CM | POA: Diagnosis not present

## 2024-01-29 DIAGNOSIS — N390 Urinary tract infection, site not specified: Secondary | ICD-10-CM | POA: Diagnosis not present

## 2024-01-29 LAB — COMPREHENSIVE METABOLIC PANEL
ALT: 9 U/L (ref 0–44)
AST: 15 U/L (ref 15–41)
Albumin: 3.7 g/dL (ref 3.5–5.0)
Alkaline Phosphatase: 39 U/L (ref 38–126)
Anion gap: 9 (ref 5–15)
BUN: 14 mg/dL (ref 8–23)
CO2: 24 mmol/L (ref 22–32)
Calcium: 9.1 mg/dL (ref 8.9–10.3)
Chloride: 107 mmol/L (ref 98–111)
Creatinine, Ser: 0.99 mg/dL (ref 0.44–1.00)
GFR, Estimated: >60 mL/min (ref >60)
Glucose, Bld: 135 mg/dL — ABNORMAL HIGH (ref 70–99)
Potassium: 4.2 mmol/L (ref 3.5–5.1)
Sodium: 140 mmol/L (ref 135–145)
Total Bilirubin: 0.5 mg/dL (ref 0.0–1.2)
Total Protein: 7.1 g/dL (ref 6.5–8.1)

## 2024-01-29 LAB — DIFFERENTIAL
Abs Immature Granulocytes: 0.02 10*3/uL (ref 0.00–0.07)
Basophils Absolute: 0.1 10*3/uL (ref 0.0–0.1)
Basophils Relative: 1 %
Eosinophils Absolute: 0.1 10*3/uL (ref 0.0–0.5)
Eosinophils Relative: 1 %
Immature Granulocytes: 0 %
Lymphocytes Relative: 17 %
Lymphs Abs: 1.3 10*3/uL (ref 0.7–4.0)
Monocytes Absolute: 0.7 10*3/uL (ref 0.1–1.0)
Monocytes Relative: 9 %
Neutro Abs: 5.6 10*3/uL (ref 1.7–7.7)
Neutrophils Relative %: 72 %
Smear Review: DECREASED

## 2024-01-29 LAB — ETHANOL: Alcohol, Ethyl (B): <10 mg/dL (ref <10)

## 2024-01-29 LAB — ABO/RH: ABO/RH(D): O POS

## 2024-01-29 LAB — CBG MONITORING, ED: Glucose-Capillary: 110 mg/dL — ABNORMAL HIGH (ref 70–99)

## 2024-01-29 LAB — CBC
HCT: 38.8 % (ref 36.0–46.0)
Hemoglobin: 12.9 g/dL (ref 12.0–15.0)
MCH: 31.3 pg (ref 26.0–34.0)
MCHC: 33.2 g/dL (ref 30.0–36.0)
MCV: 94.2 fL (ref 80.0–100.0)
Platelets: 52 10*3/uL — ABNORMAL LOW (ref 150–400)
RBC: 4.12 MIL/uL (ref 3.87–5.11)
RDW: 13 % (ref 11.5–15.5)
WBC: 7.7 10*3/uL (ref 4.0–10.5)
nRBC: 0 % (ref 0.0–0.2)

## 2024-01-29 LAB — TYPE AND SCREEN
ABO/RH(D): O POS
Antibody Screen: NEGATIVE

## 2024-01-29 LAB — APTT: aPTT: 46 s — ABNORMAL HIGH (ref 24–36)

## 2024-01-29 LAB — MRSA NEXT GEN BY PCR, NASAL: MRSA by PCR Next Gen: NOT DETECTED

## 2024-01-29 LAB — PROTIME-INR
INR: 1.2 (ref 0.8–1.2)
Prothrombin Time: 15.5 s — ABNORMAL HIGH (ref 11.4–15.2)

## 2024-01-29 LAB — GLUCOSE, CAPILLARY: Glucose-Capillary: 110 mg/dL — ABNORMAL HIGH (ref 70–99)

## 2024-01-29 MED ORDER — HYDRALAZINE HCL 20 MG/ML IJ SOLN: 5.0000 mg | INTRAMUSCULAR | Status: DC | PRN

## 2024-01-29 MED ORDER — ONDANSETRON HCL 4 MG/2ML IJ SOLN: 4.0000 mg | Freq: Four times a day (QID) | INTRAMUSCULAR | Status: AC | PRN

## 2024-01-29 MED ORDER — ACETAMINOPHEN 650 MG RE SUPP: 650.0000 mg | Freq: Four times a day (QID) | RECTAL | Status: AC | PRN

## 2024-01-29 MED ORDER — ACETAMINOPHEN 325 MG PO TABS
650.0000 mg | ORAL_TABLET | Freq: Four times a day (QID) | ORAL | Status: AC | PRN
  Administered 2024-01-31 (×2): 650 mg via ORAL
  Filled 2024-01-29 (×2): qty 2

## 2024-01-29 MED ORDER — INSULIN ASPART 100 UNIT/ML IJ SOLN
0.0000 [IU] | Freq: Every day | INTRAMUSCULAR | Status: DC
  Administered 2024-01-29: 2 [IU] via SUBCUTANEOUS
  Filled 2024-01-29: qty 1

## 2024-01-29 MED ORDER — SERTRALINE HCL 50 MG PO TABS
25.0000 mg | ORAL_TABLET | Freq: Every day | ORAL | Status: DC
  Administered 2024-01-31 – 2024-02-08 (×9): 25 mg via ORAL
  Filled 2024-01-29 (×9): qty 1

## 2024-01-29 MED ORDER — CHLORHEXIDINE GLUCONATE CLOTH 2 % EX PADS
6.0000 | MEDICATED_PAD | Freq: Every day | CUTANEOUS | Status: DC
  Administered 2024-01-30 – 2024-02-07 (×6): 6 via TOPICAL

## 2024-01-29 MED ORDER — SODIUM CHLORIDE 0.9% IV SOLUTION
Freq: Once | INTRAVENOUS | Status: DC
  Filled 2024-01-29: qty 250

## 2024-01-29 MED ORDER — MEMANTINE HCL 5 MG PO TABS
5.0000 mg | ORAL_TABLET | Freq: Every day | ORAL | Status: DC
  Administered 2024-01-31 – 2024-02-08 (×9): 5 mg via ORAL
  Filled 2024-01-29 (×10): qty 1

## 2024-01-29 MED ORDER — VITAMIN C 500 MG PO TABS
1000.0000 mg | ORAL_TABLET | Freq: Every day | ORAL | Status: DC
  Administered 2024-01-31 – 2024-02-08 (×9): 1000 mg via ORAL
  Filled 2024-01-29 (×9): qty 2

## 2024-01-29 MED ORDER — ROSUVASTATIN CALCIUM 20 MG PO TABS
20.0000 mg | ORAL_TABLET | Freq: Every day | ORAL | Status: DC
  Administered 2024-01-31 – 2024-02-08 (×9): 20 mg via ORAL
  Filled 2024-01-29 (×3): qty 1
  Filled 2024-01-29: qty 2
  Filled 2024-01-29 (×9): qty 1

## 2024-01-29 MED ORDER — FERROUS SULFATE 325 (65 FE) MG PO TABS
325.0000 mg | ORAL_TABLET | Freq: Every day | ORAL | Status: DC
  Administered 2024-01-31 – 2024-02-08 (×9): 325 mg via ORAL
  Filled 2024-01-29 (×9): qty 1

## 2024-01-29 MED ORDER — VITAMIN B-12 1000 MCG PO TABS
1000.0000 ug | ORAL_TABLET | Freq: Every day | ORAL | Status: DC
  Administered 2024-01-31 – 2024-02-08 (×9): 1000 ug via ORAL
  Filled 2024-01-29: qty 1
  Filled 2024-01-29: qty 2
  Filled 2024-01-29 (×8): qty 1

## 2024-01-29 MED ORDER — ONDANSETRON HCL 4 MG PO TABS: 4.0000 mg | ORAL_TABLET | Freq: Four times a day (QID) | ORAL | Status: AC | PRN

## 2024-01-29 MED ORDER — LEVOTHYROXINE SODIUM 25 MCG PO TABS
125.0000 ug | ORAL_TABLET | Freq: Every day | ORAL | Status: DC
  Administered 2024-01-31 – 2024-02-08 (×9): 125 ug via ORAL
  Filled 2024-01-29 (×9): qty 1

## 2024-01-29 MED ORDER — DONEPEZIL HCL 5 MG PO TABS
10.0000 mg | ORAL_TABLET | Freq: Every day | ORAL | Status: DC
  Administered 2024-01-29 – 2024-02-07 (×10): 10 mg via ORAL
  Filled 2024-01-29 (×10): qty 2

## 2024-01-29 MED ORDER — ALBUTEROL SULFATE (2.5 MG/3ML) 0.083% IN NEBU: 2.5000 mg | INHALATION_SOLUTION | Freq: Four times a day (QID) | RESPIRATORY_TRACT | Status: AC | PRN

## 2024-01-29 MED ORDER — DEXAMETHASONE SODIUM PHOSPHATE 10 MG/ML IJ SOLN
40.0000 mg | Freq: Every day | INTRAMUSCULAR | Status: AC
  Administered 2024-01-29 – 2024-02-01 (×4): 40 mg via INTRAVENOUS
  Filled 2024-01-29 (×4): qty 4

## 2024-01-29 MED ORDER — INSULIN ASPART 100 UNIT/ML IJ SOLN
0.0000 [IU] | Freq: Three times a day (TID) | INTRAMUSCULAR | Status: DC
  Administered 2024-01-30: 1 [IU] via SUBCUTANEOUS
  Administered 2024-01-30: 2 [IU] via SUBCUTANEOUS
  Administered 2024-01-31: 3 [IU] via SUBCUTANEOUS
  Administered 2024-01-31: 1 [IU] via SUBCUTANEOUS
  Administered 2024-02-01: 2 [IU] via SUBCUTANEOUS
  Administered 2024-02-03: 1 [IU] via SUBCUTANEOUS
  Administered 2024-02-04: 2 [IU] via SUBCUTANEOUS
  Administered 2024-02-05: 1 [IU] via SUBCUTANEOUS
  Administered 2024-02-06: 3 [IU] via SUBCUTANEOUS
  Administered 2024-02-07: 2 [IU] via SUBCUTANEOUS
  Filled 2024-01-29 (×10): qty 1

## 2024-01-29 NOTE — Hospital Course (Signed)
 [name] is a 68 year old female with dementia, history of DVT on Eliquis, chronic thrombocytopenia, B12 deficiency, hypertension, who presents emergency department for chief concerns of altered mental status.  Vitals in the ED showed temperature 98.4, respiration rate 16, heart rate 80, blood pressure 139/49, SpO2 of 98% on room air.  Serum sodium is 140, potassium 4.2, chloride 107, bicarb 24, BUN of 14, serum creatinine 0.99, EGFR greater than 60, nonfasting blood glucose 135, WBC 7.7, hemoglobin 12.9, platelets of 52.  CT head wo contrast: Was read as positive bilateral mixed density subdural hematomas, somewhat biconvex, 17 mm maximum thickness on the left and 14 mm maximum thickness on the right.  ED treatment: None.  Per EDP, patient's last dose of Eliquis was 01/28/2019 5 in the morning.  EDP consulted neurosurgery who states the patient will be seen and plan for neurosurgery in the a.m.

## 2024-01-29 NOTE — Assessment & Plan Note (Signed)
 -  Insulin SSI with at bedtime coverage ordered ?- Goal inpatient blood glucose levels 140-180 ?

## 2024-01-29 NOTE — Assessment & Plan Note (Signed)
 Neurosurgery has been consulted by EDP, Dr. Marcell Barlow is aware Hold Eliquis N.p.o. after midnight in anticipation of neurosurgery in the a.m. Admit to stepdown, inpatient per neurosurgery recommendation

## 2024-01-29 NOTE — Assessment & Plan Note (Signed)
 Home Eliquis will be held on admission in setting of bilateral subdural hematoma

## 2024-01-29 NOTE — Progress Notes (Signed)
 Hard to score pt. With confusion and sometimes will not follow, but easily redirected

## 2024-01-29 NOTE — Assessment & Plan Note (Signed)
 We will hold Eliquis on admission

## 2024-01-29 NOTE — ED Notes (Signed)
 Pt changed at this time. Brief changed. Peri-care preformed. Chux pad placed.

## 2024-01-29 NOTE — ED Triage Notes (Addendum)
 Per pt's daughter, pt was her typical self on Wednesday (2 days ago). Yesterday, daughter reports pt was with her brother and when she came home from work at around 3pm, pt had defecated and urinated on self, wouldn't eat, wouldn't walk as normal, and wouldn't move right arm. Pt is alert, oriented to self and situation only- daughter states this is baseline due to dementia. Pt reports less sensation in right arm and leg. No slurred speech or facial asymmetry noted at this time. Right arm and right leg weaker than left side. Pt on eliquis.

## 2024-01-29 NOTE — Assessment & Plan Note (Signed)
Sertraline 25 mg daily resumed 

## 2024-01-29 NOTE — Assessment & Plan Note (Signed)
 Not in acute exam the patient Albuterol nebulizer every 6 hours as needed for shortness of breath and wheezing, 3 days ordered

## 2024-01-29 NOTE — ED Notes (Signed)
 Pt is a hard stick. Pt stuck multiple times. Able to obtain blood work on last stick with this [employer]. Unable to place IV.

## 2024-01-29 NOTE — H&P (View-Only) (Signed)
 Consult requested by:  Dr. Fuller Plan  Consult requested for:  Bilateral subdural hematomas  Primary Physician:  Rayetta Humphrey, MD  History of Present Illness: 01/29/2024 Ms. Subrina Vecchiarelli is here today with a chief complaint of altered mental status.  She was last seen normal 2 days ago.  Yesterday, she was noted to have some right-sided weakness and had urinated on herself.  She does not normally do these things, though she does deal with some dementia.  She is normally somewhat independent, and this has been a clear departure from her baseline.  The patient reports a 7 out of 10 headache but no additional symptoms.  She answer some questions but not all questions.  She is unable to give a full history.  Of note, she was recently restarted on Eliquis and had last dose yesterday morning.  The symptoms are causing a significant impact on the patient's life.   I have utilized the care everywhere function in epic to review the outside records available from external health systems.  Review of Systems:  Unobtainable  Past Medical History: Past Medical History:  Diagnosis Date   Anxiety    Arthritis    Collagen vascular disease (HCC)    COPD (chronic obstructive pulmonary disease) (HCC)    DDD (degenerative disc disease), lumbar    Dementia (HCC)    Depression    Diabetes mellitus without complication (HCC)    DVT (deep venous thrombosis) (HCC)    Fibromyalgia    GERD (gastroesophageal reflux disease)    Graves' disease with exophthalmos    HLD (hyperlipidemia)    Hypertension    Hypothyroidism    Lupus    Osteoporosis     Past Surgical History: Past Surgical History:  Procedure Laterality Date   ABDOMINAL HYSTERECTOMY     COLONOSCOPY WITH PROPOFOL N/A 06/11/2021   Procedure: COLONOSCOPY WITH PROPOFOL;  Surgeon: Regis Bill, MD;  Location: [facility] ENDOSCOPY;  Service: Endoscopy;  Laterality: N/A;  prefers afternoon Eliquis   COLONOSCOPY WITH PROPOFOL N/A  12/19/2022   Procedure: COLONOSCOPY WITH PROPOFOL;  Surgeon: Regis Bill, MD;  Location: [facility] ENDOSCOPY;  Service: Endoscopy;  Laterality: N/A;   ESOPHAGOGASTRODUODENOSCOPY N/A 06/11/2021   Procedure: ESOPHAGOGASTRODUODENOSCOPY (EGD);  Surgeon: Regis Bill, MD;  Location[facility] ENDOSCOPY;  Service: Endoscopy;  Laterality: N/A;   EYE SURGERY Right    IVC FILTER REMOVAL N/A 07/21/2019   Procedure: IVC FILTER REMOVAL;  Surgeon: Annice Needy, MD;  Location: [facility] INVASIVE CV LAB;  Service: Cardiovascular;  Laterality: N/A;   LOWER EXTREMITY INTERVENTION Right 04/22/2019   Procedure: IVC Filter Insertion with Right Lower Extremity Venous Lysis;  Surgeon: Annice Needy, MD;  Location: [facility] INVASIVE CV LAB;  Service: Cardiovascular;  Laterality: Right;   OOPHORECTOMY      Allergies: Allergies as of 01/29/2024 - Review Complete 01/29/2024  Allergen Reaction Noted   Morphine and codeine Anaphylaxis 04/21/2019   Montelukast  02/23/2017   Antihistamines, diphenhydramine-type Nausea Only 06/10/2021   Aspirin Other (See Comments) 12/29/2016   Benadryl [diphenhydramine hcl] Hives 12/29/2016   Codeine Other (See Comments) 04/21/2019    Medications: No outpatient medications have been marked as taking for the 01/29/24 encounter [facility] Encounter).    Social History: Social History   Tobacco Use   Smoking status: Former    Current packs/day: 0.00    Average packs/day: 1 pack/day for 17.0 years (17.0 ttl pk-yrs)    Types: Cigarettes    Start date: 40  Quit date: 2002    Years since quitting: 23.2   Smokeless tobacco: Never  Vaping Use   Vaping status: Never Used  Substance Use Topics   Alcohol use: Not Currently   Drug use: Never    Family Medical History: Family History  Problem Relation Age of Onset   Stomach cancer Mother    Heart attack Father    Breast cancer Neg Hx     Physical Examination: Vitals:   01/29/24 1149  BP: (!) 139/49  Pulse: 80  Resp: 16   Temp: 98.4 F (36.9 C)  SpO2: 98%    General: Patient is in no apparent distress. Attention to examination is diminished Neck:   Supple.  Full range of motion.  Respiratory: Patient is breathing without any difficulty.   NEUROLOGICAL:     Awake, alert, oriented to person and place.  Her speech is sparse.  She does answer some questions.  Cranial Nerves: Pupils equal round and reactive to light.  Facial tone is symmetric.  Facial sensation is symmetric. Shoulder shrug is symmetric. Tongue protrusion is midline.  She has right-sided pronator drift Strength:  Side Biceps Triceps Deltoid Interossei Grip Wrist Ext. Wrist Flex.  R 4 4 4  4- 4- 4 4  L 5 5 5 5 5 5 5    Side Iliopsoas Quads Hamstring PF DF EHL  R 5 5 5 5 5  -  L 5 5 5 5 5  -    Sensory examination is unreliable   gait is untested.     Medical Decision Making  Imaging: CT head 01/29/2024  IMPRESSION: 1. Positive for bilateral mixed density Subdural Hematomas, somewhat biconvex and with 17 mm maximum thickness on the Left and 14 mm maximum thickness on the Right. Subsequent mass effect on both hemispheres although no significant midline shift. Basilar cisterns remain patent.   2. No skull fracture or other acute intracranial abnormality identified. 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   Electronically Signed: By: Odessa Fleming M.D. On: 01/29/2024 12:57    I have personally reviewed the images and agree with the above interpretation.  CBC    Component Value Date/Time   WBC 7.7 01/29/2024 1307   RBC 4.12 01/29/2024 1307   HGB 12.9 01/29/2024 1307   HCT 38.8 01/29/2024 1307   PLT 52 (L) 01/29/2024 1307   MCV 94.2 01/29/2024 1307   MCH 31.3 01/29/2024 1307   MCHC 33.2 01/29/2024 1307   RDW 13.0 01/29/2024 1307   LYMPHSABS 1.3 01/29/2024 1307   MONOABS 0.7 01/29/2024 1307   EOSABS 0.1 01/29/2024 1307   BASOSABS 0.1 01/29/2024 1307   INR/Prothrombin  Time 1.2  Assessment and Plan: Ms. Prestia is a pleasant 68 y.o. female with decreased mental status and right-sided weakness with new diagnosis of bilateral chronic subdural hematomas.  She has recently been on therapeutic dosing of Eliquis.  She also suffers from chronic thrombocytopenia.  Based on her recent decline in status and her compression from the subdural hematomas, I recommended bilateral bur hole for subdural hematoma evacuation.  Due to her recent dosing of Eliquis and her current stable but altered neurologic condition, I have recommended deferring drainage until tomorrow when she will be 48 hours since her last dose of Eliquis.  The patient is unable to give consent for the procedure, but her daughter and caretaker amber Blankenburg is present.  I discussed the planned procedure at length with the patient's daughter, including the risks, benefits, alternatives, and  indications. The risks discussed include but are not limited to bleeding, infection, need for reoperation, spinal fluid leak, stroke, vision loss, anesthetic complication, coma, paralysis, and even death. I also described in detail that improvement was not guaranteed.  The patient's daughter expressed understanding of these risks, and asked that we proceed with surgery. I described the surgery in layman's terms, and gave ample opportunity for questions, which were answered to the best of my ability.  I have communicated my recommendations to the requesting physician and coordinated care to facilitate these recommendations.  Dr. Sedalia Muta and I agree that admission to the stepdown unit was appropriate for close monitoring.  She will discuss strategies for optimizing her platelet count prior to surgery with the hematologist on-call.     Britain Saber K. Myer Haff MD, [facility] Neurosurgery

## 2024-01-29 NOTE — ED Notes (Signed)
 Pt taken to CT at this time. Delay in obtaining blood work.

## 2024-01-29 NOTE — ED Notes (Signed)
 Lab called for blood draw.

## 2024-01-29 NOTE — Telephone Encounter (Signed)
 Dr Myer Haff saw this patient as a consult on 01/29/24 for bilateral subdural hematomas. She is scheduled for surgery on 01/30/24. I have scheduled her post op appointments. I have ordered a repeat head CT to be done in 4-6 weeks (prior to her 6 week post op appointment) per our usual protocol.

## 2024-01-29 NOTE — Assessment & Plan Note (Signed)
 Hydralazine 5 mg IV every 4 hours as needed for SBP greater than 160, 10 days ordered

## 2024-01-29 NOTE — ED Provider Notes (Signed)
 [facility] Provider Note    Event Date/Time   First MD Initiated Contact with Patient 01/29/24 1212     (approximate)   History   Cerebrovascular Accident   HPI  Alison Lopez is a 68 y.o. female  \with history of DVT on Eliquis, chronic thrombocytopenia, B12 deficiency who comes in with concerns for stroke.  To note patient was seen by oncology on 01/26/2024.  Patient had been off her Eliquis for some time but it was thought that patient probably has antiphospholipid antibody syndrome and they had recommended that she resume her Eliquis.  According to daughter who lives with her she was last seen normal on Wednesday when going to bed.  She did not see her on Thursday until around 5 PM where she was noted to have right sided weakness and was not willing to use her right arm to feed herself.  She also had urinated on herself and defecated on herself during the nighttime which is very abnormal for her.  She does report that there is a lot of stress right now in the house and that the son who lives somewhere else had come to visit and had showed up drunk last night.  She states that the son was with her during the day Thursday but is unclear if she was ever normal during this time.  She does not have a good relationship with him to be able to ask.  Since coming to the emergency room her symptoms have actually significantly resolved.  She is not able to move the right side of her body.  Physical Exam   Triage Vital Signs: ED Triage Vitals  Encounter Vitals Group     BP 01/29/24 1149 (!) 139/49     Systolic BP Percentile --      Diastolic BP Percentile --      Pulse Rate 01/29/24 1149 80     Resp 01/29/24 1149 16     Temp 01/29/24 1149 98.4 F (36.9 C)     Temp Source 01/29/24 1149 Oral     SpO2 01/29/24 1149 98 %     Weight 01/29/24 1150 114 lb (51.7 kg)     Height 01/29/24 1150 4\' 9"  (1.448 m)     Head Circumference --      Peak Flow --      Pain Score  01/29/24 1150 5     Pain Loc --      Pain Education --      Exclude from Growth Chart --     Most recent vital signs: Vitals:   01/29/24 1149  BP: (!) 139/49  Pulse: 80  Resp: 16  Temp: 98.4 F (36.9 C)  SpO2: 98%     General: Awake, no distress.  CV:  Good peripheral perfusion.  Resp:  Normal effort.  Abd:  No distention.  Other:  No obvious weakness noted on the side.  Per the daughter her weakness seems to have resolved   ED Results / Procedures / Treatments   Labs (all labs ordered are listed, but only abnormal results are displayed) Labs Reviewed  CBG MONITORING, ED - Abnormal; Notable for the following components:      Result Value   Glucose-Capillary 110 (*)    All other components within normal limits  PROTIME-INR  APTT  CBC  DIFFERENTIAL  COMPREHENSIVE METABOLIC PANEL  ETHANOL  I-STAT CREATININE, ED     EKG  My interpretation of EKG:  Rate of  80 without any ST elevation, T wave versions in V2 and V3, right bundle branch block  RADIOLOGY I have reviewed the CT head personally interpreted positive subdurals bilaterally   PROCEDURES:  Critical Care performed: Yes, see critical care procedure note(s)  .Critical Care  Performed by: Concha Se, MD Authorized by: Concha Se, MD   Critical care provider statement:    Critical care time (minutes):  30   Critical care was necessary to treat or prevent imminent or life-threatening deterioration of the following conditions:  CNS failure or compromise   Critical care was time spent personally by me on the following activities:  Development of treatment plan with patient or surrogate, discussions with consultants, evaluation of patient's response to treatment, examination of patient, ordering and review of laboratory studies, ordering and review of radiographic studies, ordering and performing treatments and interventions, pulse oximetry, re-evaluation of patient's condition and review of old  charts    MEDICATIONS ORDERED IN ED: Medications  acetaminophen (TYLENOL) tablet 650 mg (has no administration in time range)    Or  acetaminophen (TYLENOL) suppository 650 mg (has no administration in time range)  ondansetron (ZOFRAN) tablet 4 mg (has no administration in time range)    Or  ondansetron (ZOFRAN) injection 4 mg (has no administration in time range)  hydrALAZINE (APRESOLINE) injection 5 mg (has no administration in time range)  insulin aspart (novoLOG) injection 0-5 Units (has no administration in time range)  insulin aspart (novoLOG) injection 0-9 Units (has no administration in time range)  albuterol (PROVENTIL) (2.5 MG/3ML) 0.083% nebulizer solution 2.5 mg (has no administration in time range)  dexamethasone (DECADRON) injection 40 mg (has no administration in time range)  0.9 %  sodium chloride infusion (Manually program via Guardrails IV Fluids) (has no administration in time range)  simvastatin (ZOCOR) tablet 40 mg (has no administration in time range)  levothyroxine (SYNTHROID) tablet 75 mcg (has no administration in time range)  sertraline (ZOLOFT) tablet 25 mg (has no administration in time range)  donepezil (ARICEPT) tablet 10 mg (has no administration in time range)  cyanocobalamin (VITAMIN B12) tablet 1,000 mcg (has no administration in time range)  ferrous sulfate tablet 325 mg (has no administration in time range)  ascorbic acid (VITAMIN C) tablet 1,000 mg (has no administration in time range)  memantine (NAMENDA) tablet 5 mg (has no administration in time range)     IMPRESSION / MDM / ASSESSMENT AND PLAN / ED COURSE  I reviewed the triage vital signs and the nursing notes.   Patient's presentation is most consistent with acute presentation with potential threat to life or bodily function.   Patient comes in with concern for right-sided weakness stroke code not called given out of the window.  On examination she now is able to move the right side so no  obvious evidence of LVO and last known normal was over 24 hours ago differential includes intracranial hemorrhage, Electra abnormalities, AKI  CT head consistent with subdural hematomas.  CBC reassuring stably low platelets.  CMP reassuring alcohol negative  sHe denies any falls but added on a CT cervical  Discussed case with Dr. Marcell Barlow from neurosurgery recommends admission for drainage tomorrow.  Note in need of reversal given last took Eliquis Thursday morning.  Discontinue to hold Eliquis. Admit to SDU.   The patient is on the cardiac monitor to evaluate for evidence of arrhythmia and/or significant heart rate changes.      FINAL CLINICAL IMPRESSION(S) / ED DIAGNOSES  Final diagnoses:  Subdural hematoma (HCC)     Rx / DC Orders   ED Discharge Orders     None        Note:  This document was prepared using Dragon voice recognition software and may include unintentional dictation errors.   Concha Se, MD 01/29/24 [phone]

## 2024-01-29 NOTE — H&P (Addendum)
 History and Physical   Alison Lopez HQI:[id number] DOB: [birth year] DOA: 01/29/2024  PCP: Rayetta Humphrey, MD  Outpatient Specialists: Dr. Cathie Hoops, medical oncology Patient coming from: Home  I have personally briefly reviewed patient's old medical records in [facility] EMR.  Chief Concern: Altered mental status  HPI: Alison Lopez is a 68 year old female with dementia, history of DVT on Eliquis, chronic thrombocytopenia, B12 deficiency, hypertension, who presents emergency department for chief concerns of altered mental status.  Vitals in the ED showed temperature 98.4, respiration rate 16, heart rate 80, blood pressure 139/49, SpO2 of 98% on room air.  Serum sodium is 140, potassium 4.2, chloride 107, bicarb 24, BUN of 14, serum creatinine 0.99, EGFR greater than 60, nonfasting blood glucose 135, WBC 7.7, hemoglobin 12.9, platelets of 52.  CT head wo contrast: Was read as positive bilateral mixed density subdural hematomas, somewhat biconvex, 17 mm maximum thickness on the left and 14 mm maximum thickness on the right.  ED treatment: None.  Per EDP, patient's last dose of Eliquis was 01/28/2019 5 in the morning.  EDP consulted neurosurgery who states the patient will be seen and plan for neurosurgery in the a.m. ------------------------------ At bedside, patient able to tell me her first and last name, she states that she was not able to tell me the current calendar year.  She is able to tell me her current location and she knows her daughter is at bedside.  She denies trauma to her person, specifically falling.  She denies chest pain, shortness of breath, dysuria, hematuria.  Patient has right upper extremity weakness.  Daughter endorses that patient has not been feeding herself very well.  Daughter saw her Wednesday evening and she was last known normal going to bed on Wednesday evening.  Daughter last saw her on Thursday around 5 PM.  Patient reports that on Thursday evening,  patient went to bed and urinated on herself and defecated on himself.  This is not normal for her.  Social history: She lives at home.  She denies tobacco, EtOH, recreational drug use.  Her son who lives in [place] came to see her yesterday and did give patient some marijuana smoking.  Patient is retired.  ROS: Unable to complete as patient has dementia  ED Course: Discussed with EDP, patient requiring hospitalization for chief concerns of bilateral subdural hematomas.  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 Marcell Barlow is aware Hold Eliquis N.p.o. after midnight in anticipation of neurosurgery in the a.m. Admit to stepdown, inpatient per neurosurgery recommendation  Dementia without behavioral disturbance (HCC) Home donepezil 10 mg nightly, memantine 5 mg daily were resumed on admission  Thrombocytopenia (HCC) Goal platelet > 75 prior to neurosurgery in the AM Discussed with hematologist, Dr. Cathie Hoops Decadron 40 mg IV, daily, 4 days ordered on admission Type and screen ordered on admission Platelet 1 unit ordered  for transfusion tomorrow at midnight Recheck CBC in a.m.  Type 2 diabetes mellitus without complication (HCC) Insulin SSI with at bedtime coverage ordered Goal inpatient blood glucose levels  140-180  Hyperlipidemia Simvastatin 40 mg daily  History of DVT of lower extremity Home Eliquis will be held on admission in setting of bilateral subdural hematoma  Fibromyalgia Sertraline 25 mg daily resumed PDMP reviewed, no current active controlled substance prescription at this time  Depression Sertraline 25 mg daily resumed  COPD (chronic obstructive pulmonary disease) (HCC) Not in acute exam the patient Albuterol nebulizer every 6 hours as needed for shortness of breath and wheezing, 3 days ordered  Anticoagulant long-term use We will hold Eliquis on admission  Essential hypertension Hydralazine 5 mg IV every 4 hours as needed for SBP greater than 160, 10 days ordered  Chart reviewed.   DVT prophylaxis: TED hose Code Status: Full code Diet: Per nursing at bedside, patient has passed swallow screening; heart healthy/carb modified, nectar thick; n.p.o. after midnight Family Communication: Updated daughter Alison Lopez at bedside Disposition Plan: Pending clinical course Consults called: Neurosurgery Admission status: Inpatient, stepdown  Past Medical History:  Diagnosis Date   Anxiety    Arthritis    Collagen vascular disease (HCC)    COPD (chronic obstructive pulmonary disease) (HCC)    DDD (degenerative disc disease), lumbar    Dementia (HCC)    Depression    Diabetes mellitus without complication (HCC)    DVT (deep venous thrombosis) (HCC)    Fibromyalgia    GERD (gastroesophageal reflux disease)    Graves' disease with exophthalmos    HLD (hyperlipidemia)    Hypertension    Hypothyroidism    Lupus    Osteoporosis    Past Surgical History:  Procedure Laterality Date   ABDOMINAL HYSTERECTOMY     COLONOSCOPY WITH PROPOFOL N/A 06/11/2021   Procedure: COLONOSCOPY WITH PROPOFOL;  Surgeon: Regis Bill, MD;  Location: [facility] ENDOSCOPY;  Service: Endoscopy;  Laterality: N/A;  prefers afternoon Eliquis   COLONOSCOPY WITH PROPOFOL N/A 12/19/2022    Procedure: COLONOSCOPY WITH PROPOFOL;  Surgeon: Regis Bill, MD;  Location: [facility] ENDOSCOPY;  Service: Endoscopy;  Laterality: N/A;   ESOPHAGOGASTRODUODENOSCOPY N/A 06/11/2021   Procedure: ESOPHAGOGASTRODUODENOSCOPY (EGD);  Surgeon: Regis Bill, MD;  Location: [facility] ENDOSCOPY;  Service: Endoscopy;  Laterality: N/A;   EYE SURGERY Right    IVC FILTER REMOVAL N/A 07/21/2019   Procedure: IVC FILTER REMOVAL;  Surgeon: Annice Needy, MD;  Location: [facility] INVASIVE CV LAB;  Service: Cardiovascular;  Laterality: N/A;   LOWER EXTREMITY INTERVENTION Right 04/22/2019   Procedure: IVC Filter Insertion with Right Lower Extremity Venous Lysis;  Surgeon: Annice Needy, MD;  Location: [facility] INVASIVE CV LAB;  Service: Cardiovascular;  Laterality: Right;   OOPHORECTOMY     Social History:  reports that she quit smoking about 23 years ago. Her smoking use included cigarettes. She started smoking about 40 years ago. She has a 17 pack-year smoking history. She has never used smokeless tobacco. She reports that she does not currently use alcohol. She reports that she does not use drugs.  Allergies  Allergen Reactions   Morphine And Codeine Anaphylaxis   Montelukast     Other reaction(s): Other (See Comments) Nightmares   Antihistamines, Diphenhydramine-Type Nausea Only   Aspirin Other (See Comments)    Patient on blood thinners and was told not to take   Benadryl [Diphenhydramine Hcl] Hives   Codeine Other (See Comments)    AMS  Family History  Problem Relation Age of Onset   Stomach cancer Mother    Heart attack Father    Breast cancer Neg Hx    Family history: Family history reviewed and not pertinent.  Prior to Admission medications   Medication Sig Start Date End Date Taking? Authorizing Provider  acetaminophen (TYLENOL) 650 MG CR tablet Take 650 mg by mouth every 8 (eight) hours as needed for pain. Patient not taking: Reported on 01/26/2024    [provider]  albuterol  (PROVENTIL) (2.5 MG/3ML) 0.083% nebulizer solution Take 2.5 mg by nebulization every 4 (four) hours as needed for wheezing or shortness of breath.  Patient not taking: Reported on 01/26/2024    [provider]  albuterol (VENTOLIN HFA) 108 (90 Base) MCG/ACT inhaler Inhale 2 puffs into the lungs every 4 (four) hours as needed for wheezing or shortness of breath. Patient not taking: Reported on 01/26/2024    [provider]  apixaban (ELIQUIS) 5 MG TABS tablet Take 1 tablet (5 mg total) by mouth 2 (two) times daily. 01/26/24   Rickard Patience, MD  ascorbic acid (VITAMIN C) 100 MG tablet Take 1,000 mg by mouth daily.    [provider]  buPROPion (WELLBUTRIN SR) 150 MG 12 hr tablet Take 150 mg by mouth daily.    [provider]  cyanocobalamin (VITAMIN B12) 1000 MCG tablet Take 1 tablet (1,000 mcg total) by mouth daily. 01/22/24   Rickard Patience, MD  cyclobenzaprine (FLEXERIL) 5 MG tablet Take 5 mg by mouth 3 (three) times daily as needed for muscle spasms. Patient not taking: Reported on 01/26/2024    [provider]  donepezil (ARICEPT) 10 MG tablet Take 10 mg by mouth at bedtime. 08/27/22   [provider]  ergocalciferol (VITAMIN D2) 1.25 MG (50000 UT) capsule Take 50,000 Units by mouth every Friday.    [provider]  ferrous sulfate 325 (65 FE) MG tablet Take by mouth.    [provider]  levothyroxine (SYNTHROID, LEVOTHROID) 88 MCG tablet Take 75 mcg by mouth daily before breakfast.     [provider]  losartan (COZAAR) 50 MG tablet Take 50 mg by mouth daily. prn    [provider]  memantine (NAMENDA) 5 MG tablet Take 5 mg by mouth daily. 08/27/22   [provider]  metFORMIN (GLUCOPHAGE) 500 MG tablet Take 500 mg by mouth daily.     [provider]  sertraline (ZOLOFT) 25 MG tablet Take 25 mg by mouth daily. 08/27/22   [provider]  simvastatin (ZOCOR) 40 MG tablet Take 40 mg by mouth  daily.    [provider]   Physical Exam: Vitals:   01/29/24 1149 01/29/24 1150  BP: (!) 139/49   Pulse: 80   Resp: 16   Temp: 98.4 F (36.9 C)   TempSrc: Oral   SpO2: 98%   Weight:  51.7 kg  Height:  4\' 9"  (1.448 m)   Constitutional: appears older than chronological age, frail, cachectic appearing Eyes: PERRL, lids and conjunctivae normal ENMT: Mucous membranes are moist. Posterior pharynx clear of any exudate or lesions. Age-appropriate dentition. Hearing appropriate Neck: normal, supple, no masses, no thyromegaly Respiratory: clear to auscultation bilaterally, no wheezing, no crackles. Normal respiratory effort. No accessory muscle use.  Cardiovascular: Regular rate and rhythm, no murmurs / rubs / gallops. No extremity edema. 2+ pedal pulses. No carotid bruits.  Abdomen: no tenderness, no masses palpated, no hepatosplenomegaly. Bowel sounds positive.  Musculoskeletal: no  clubbing / cyanosis. No joint deformity upper and lower extremities. Good ROM, no contractures, no atrophy. Normal muscle tone.  Skin: no rashes, lesions, ulcers. No induration Neurologic: Sensation intact.  Right upper extremity weakness compared to her left Psychiatric: Unable to assess judgment, insight, alertness, orientation.  Depressed mood.  But affect  EKG: independently reviewed, showing junctional rhythm with rate of 80  Chest x-ray on Admission: Not indicated at this time  CT Cervical Spine Wo Contrast Result Date: 01/29/2024 CLINICAL DATA:  Right upper and lower extremity weakness. Abrupt change. Neck trauma. EXAM: CT CERVICAL SPINE WITHOUT CONTRAST TECHNIQUE: Multidetector CT imaging of the cervical spine was performed without intravenous contrast. Multiplanar CT image reconstructions were also generated. RADIATION DOSE REDUCTION: This exam was performed according to the departmental dose-optimization program which includes automated exposure control, adjustment of the mA and/or kV according  to patient size and/or use of iterative reconstruction technique. COMPARISON:  None Available. FINDINGS: Alignment: Slight degenerative anterolisthesis is present at C4-5. No other significant listhesis is present. Straightening of the normal cervical lordosis is present. Rightward curvature of the cervical spine is centered at C6. Skull base and vertebrae: The craniocervical junction is normal. Vertebral body heights are normal. No acute fractures are present. Soft tissues and spinal canal: The soft tissues of the neck are otherwise unremarkable. Salivary glands are within normal limits. Thyroid is normal. No significant adenopathy is present. No focal mucosal or submucosal lesions are present. Disc levels: Chronic endplate degenerative changes are present at C5-6. Uncovertebral spurring leads to moderate left and mild right foraminal stenosis. 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 Upper chest: The lung apices are clear. The thoracic inlet is within normal limits. IMPRESSION: 1. No acute or focal lesion to explain the patient's symptoms. 2. Chronic endplate degenerative changes at C5-6 with moderate left and mild right foraminal stenosis. 3. 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 Electronically Signed   By: Marin Roberts M.D.   On: 01/29/2024 15:08   CT HEAD WO CONTRAST Addendum Date: 01/29/2024 ADDENDUM REPORT: 01/29/2024 13:05 ADDENDUM: Study discussed by telephone with Dr. Artis Delay on 01/29/2024 at 1259 hours. Electronically Signed   By: Odessa Fleming M.D.   On: 01/29/2024 13:05   Result Date: 01/29/2024 CLINICAL DATA:  68 year old female with abrupt onset altered mental status, incontinence, not moving right arm. History of dementia. EXAM: CT HEAD WITHOUT CONTRAST TECHNIQUE: Contiguous axial images were obtained from the base of the skull through the vertex without intravenous contrast. RADIATION DOSE REDUCTION: This exam was performed according to the departmental dose-optimization program which includes  automated exposure control, adjustment of the mA and/or kV according to patient size and/or use of iterative reconstruction technique. COMPARISON:  Head CT 02/13/2005. FINDINGS: Brain: New since 2006 bihemispheric hypodense subdural hematomas with intracranial mass effect. Both collections are fairly uniformly hypodense throughout much of the hemisphere but there is layering hyperdense blood posteriorly on both sides (series 2, image 17). Both collections are somewhat biconvex. That on the left measures up to 17 mm in maximum thickness (coronal image 42), on the right 14 mm in maximum thickness. Questionable trace para falcine hyperdense blood products also on the left coronal image 17. But no other convincing multi spatial hemorrhage. Subsequently there is no significant midline shift. Mass effect on the lateral ventricles but the basilar cisterns remain patent. Incidental choroid plexus cysts (normal variant). Chronic appearing encephalomalacia in both occipital poles (series 2, image 15), new since 2006. No cortically based acute infarct identified. And multiple small chronic  appearing cerebellar infarcts more numerous on the left. Other patchy bilateral cerebral white matter hypodensity. Vascular: Calcified atherosclerosis at the skull base. No suspicious intracranial vascular hyperdensity. Skull: Intact.  No acute osseous abnormality identified. Sinuses/Orbits: Visualized paranasal sinuses and mastoids are stable and well aerated. Other: Visualized orbits and scalp soft tissues are within normal limits. IMPRESSION: 1. Positive for bilateral mixed density Subdural Hematomas, somewhat biconvex and with 17 mm maximum thickness on the Left and 14 mm maximum thickness on the Right. Subsequent mass effect on both hemispheres although no significant midline shift. Basilar cisterns remain patent. 2. No skull fracture or other acute intracranial abnormality identified. 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 Electronically Signed: By: Odessa Fleming M.D. On: 01/29/2024 12:57   Labs on Admission: I have personally reviewed following labs  CBC: Recent Labs  Lab 01/29/24 1307  WBC 7.7  NEUTROABS 5.6  HGB 12.9  HCT 38.8  MCV 94.2  PLT 52*   Basic Metabolic Panel: Recent Labs  Lab 01/29/24 1307  NA 140  K 4.2  CL 107  CO2 24  GLUCOSE 135*  BUN 14  CREATININE 0.99  CALCIUM 9.1   GFR: Estimated Creatinine Clearance: 37.1 mL/min (by C-G formula based on SCr of 0.99 mg/dL).  Liver Function Tests: Recent Labs  Lab 01/29/24 1307  AST 15  ALT 9  ALKPHOS 39  BILITOT 0.5  PROT 7.1  ALBUMIN 3.7   Coagulation Profile: Recent Labs  Lab 01/29/24 1307  INR 1.2   CBG: Recent Labs  Lab 01/29/24 1205  GLUCAP 110*   Urine analysis:    Component Value Date/Time   COLORURINE AMBER (A) 12/17/2023 1304   APPEARANCEUR CLEAR (A) 12/17/2023 1304   LABSPEC 1.006 12/17/2023 1304   PHURINE 5.0 12/17/2023 1304   GLUCOSEU NEGATIVE 12/17/2023 1304   HGBUR NEGATIVE 12/17/2023 1304   BILIRUBINUR NEGATIVE 12/17/2023 1304   KETONESUR NEGATIVE 12/17/2023 1304   PROTEINUR NEGATIVE 12/17/2023 1304   NITRITE POSITIVE (A) 12/17/2023 1304   LEUKOCYTESUR NEGATIVE 12/17/2023 1304   CRITICAL CARE Performed by: Dr. Sedalia Muta  Total critical care time: 32 minutes  Critical care time was exclusive of separately billable procedures and treating other patients.  Critical care was necessary to treat or prevent imminent or life-threatening deterioration.  Critical care was time spent personally by me on the following activities: development of treatment plan with daughter as well as nursing, discussions with consultants, evaluation of patient's response to treatment, examination of patient, obtaining history from patient or surrogate, ordering and performing treatments and interventions, ordering and review of laboratory studies, ordering and review of radiographic studies,  pulse oximetry and re-evaluation of patient's condition.  This document was prepared using Dragon Voice Recognition software and may include unintentional dictation errors.  Dr. Sedalia Muta [facility]  If 7PM-7AM, please contact overnight-coverage provider If 7AM-7PM, please contact day attending provider [email or web address]  01/29/2024, 4:32 PM

## 2024-01-29 NOTE — Assessment & Plan Note (Addendum)
 Goal platelet > 75 prior to neurosurgery in the AM Discussed with hematologist, Dr. Cathie Hoops Decadron 40 mg IV, daily, 4 days ordered on admission Type and screen ordered on admission Platelet 1 unit ordered for transfusion tomorrow at midnight Recheck CBC in a.m.

## 2024-01-29 NOTE — Assessment & Plan Note (Signed)
 Home donepezil 10 mg nightly, memantine 5 mg daily were resumed on admission

## 2024-01-29 NOTE — Anesthesia Preprocedure Evaluation (Addendum)
 Anesthesia Evaluation  Patient identified by MRN, date of birth, ID band Patient confused    Reviewed: Allergy & Precautions, NPO status , Patient's Chart, lab work & pertinent test results  History of Anesthesia Complications Negative for: history of anesthetic complications  Airway Mallampati: III  TM Distance: <3 FB Neck ROM: full    Dental  (+) Chipped   Pulmonary COPD, former smoker   Pulmonary exam normal        Cardiovascular hypertension, Normal cardiovascular exam     Neuro/Psych  PSYCHIATRIC DISORDERS     Dementia  Neuromuscular disease    GI/Hepatic negative GI ROS, Neg liver ROS,,,  Endo/Other  diabetes, Type 2Hypothyroidism    Renal/GU      Musculoskeletal   Abdominal   Peds  Hematology negative hematology ROS (+)   Anesthesia Other Findings Past Medical History: No date: Anxiety No date: Arthritis No date: Collagen vascular disease (HCC) No date: COPD (chronic obstructive pulmonary disease) (HCC) No date: DDD (degenerative disc disease), lumbar No date: Dementia (HCC) No date: Depression No date: Diabetes mellitus without complication (HCC) No date: DVT (deep venous thrombosis) (HCC) No date: Fibromyalgia No date: GERD (gastroesophageal reflux disease) No date: Graves' disease with exophthalmos No date: HLD (hyperlipidemia) No date: Hypertension No date: Hypothyroidism No date: Lupus No date: Osteoporosis  Past Surgical History: No date: ABDOMINAL HYSTERECTOMY 06/11/2021: COLONOSCOPY WITH PROPOFOL; N/A     Comment:  Procedure: COLONOSCOPY WITH PROPOFOL;  Surgeon:               Regis Bill, MD;  Location: [facility] ENDOSCOPY;                Service: Endoscopy;  Laterality: N/A;  prefers               afternoon Eliquis 12/19/2022: COLONOSCOPY WITH PROPOFOL; N/A     Comment:  Procedure: COLONOSCOPY WITH PROPOFOL;  Surgeon:               Regis Bill, MD;  Location: [facility] ENDOSCOPY;                 Service: Endoscopy;  Laterality: N/A; 06/11/2021: ESOPHAGOGASTRODUODENOSCOPY; N/A     Comment:  Procedure: ESOPHAGOGASTRODUODENOSCOPY (EGD);  Surgeon:               Regis Bill, MD;  Location: [facility] ENDOSCOPY;                Service: Endoscopy;  Laterality: N/A; No date: EYE SURGERY; Right 07/21/2019: IVC FILTER REMOVAL; N/A     Comment:  Procedure: IVC FILTER REMOVAL;  Surgeon: Annice Needy,               MD;  Location: [facility] INVASIVE CV LAB;  Service:               Cardiovascular;  Laterality: N/A; 04/22/2019: LOWER EXTREMITY INTERVENTION; Right     Comment:  Procedure: IVC Filter Insertion with Right Lower               Extremity Venous Lysis;  Surgeon: Annice Needy, MD;                Location: [facility] INVASIVE CV LAB;  Service: Cardiovascular;              Laterality: Right; No date: OOPHORECTOMY  BMI    Body Mass Index: 24.67 kg/m      Reproductive/Obstetrics negative OB ROS  Anesthesia Physical Anesthesia Plan  ASA: 3  Anesthesia Plan: General ETT   Post-op Pain Management:    Induction: Intravenous  PONV Risk Score and Plan: Ondansetron, Dexamethasone, Midazolam and Treatment may vary due to age or medical condition  Airway Management Planned: Oral ETT  Additional Equipment:   Intra-op Plan:   Post-operative Plan: Extubation in OR  Informed Consent: I have reviewed the patients History and Physical, chart, labs and discussed the procedure including the risks, benefits and alternatives for the proposed anesthesia with the patient or authorized representative who has indicated his/her understanding and acceptance.     Dental Advisory Given  Plan Discussed with: Anesthesiologist, CRNA and Surgeon  Anesthesia Plan Comments: (History and phone consent from the patients daughter Siyana Erney at [phone]  Daughter consented for risks of anesthesia including but not limited to:  - adverse  reactions to medications - damage to eyes, teeth, lips or other oral mucosa - nerve damage due to positioning  - sore throat or hoarseness - Damage to heart, brain, nerves, lungs, other parts of body or loss of life She voiced understanding and assent.   Update by Nelta Numbers 01/30/24- Right eye pupil fixed with poor vision. Right arm weak and uncoordinated. Pt does not follow commands appropriately and Right leg is weak but unable to adequately examine.  )       Anesthesia Quick Evaluation

## 2024-01-29 NOTE — ED Notes (Signed)
Attempted to draw blood x2 w/ out success.

## 2024-01-29 NOTE — Assessment & Plan Note (Signed)
 Simvastatin 40 mg daily.

## 2024-01-29 NOTE — Consult Note (Signed)
 Consult requested by:  Dr. Fuller Lopez  Consult requested for:  Bilateral subdural hematomas  Primary Physician:  Alison Humphrey, MD  History of Present Illness: 01/29/2024 [name] is here today with a chief complaint of altered mental status.  She was last seen normal 2 days ago.  Yesterday, she was noted to have some right-sided weakness and had urinated on herself.  She does not normally do these things, though she does deal with some dementia.  She is normally somewhat independent, and this has been a clear departure from her baseline.  The patient reports a 7 out of 10 headache but no additional symptoms.  She answer some questions but not all questions.  She is unable to give a full history.  Of note, she was recently restarted on Eliquis and had last dose yesterday morning.  The symptoms are causing a significant impact on the patient's life.   I have utilized the care everywhere function in epic to review the outside records available from external health systems.  Review of Systems:  Unobtainable  Past Medical History: Past Medical History:  Diagnosis Date   Anxiety    Arthritis    Collagen vascular disease (HCC)    COPD (chronic obstructive pulmonary disease) (HCC)    DDD (degenerative disc disease), lumbar    Dementia (HCC)    Depression    Diabetes mellitus without complication (HCC)    DVT (deep venous thrombosis) (HCC)    Fibromyalgia    GERD (gastroesophageal reflux disease)    Graves' disease with exophthalmos    HLD (hyperlipidemia)    Hypertension    Hypothyroidism    Lupus    Osteoporosis     Past Surgical History: Past Surgical History:  Procedure Laterality Date   ABDOMINAL HYSTERECTOMY     COLONOSCOPY WITH PROPOFOL N/A 06/11/2021   Procedure: COLONOSCOPY WITH PROPOFOL;  Surgeon: Alison Bill, MD;  Location: [facility] ENDOSCOPY;  Service: Endoscopy;  Laterality: N/A;  prefers afternoon Eliquis   COLONOSCOPY WITH PROPOFOL N/A  12/19/2022   Procedure: COLONOSCOPY WITH PROPOFOL;  Surgeon: Alison Bill, MD;  Location: [facility] ENDOSCOPY;  Service: Endoscopy;  Laterality: N/A;   ESOPHAGOGASTRODUODENOSCOPY N/A 06/11/2021   Procedure: ESOPHAGOGASTRODUODENOSCOPY (EGD);  Surgeon: Alison Bill, MD;  Location: [facility] ENDOSCOPY;  Service: Endoscopy;  Laterality: N/A;   EYE SURGERY Right    IVC FILTER REMOVAL N/A 07/21/2019   Procedure: IVC FILTER REMOVAL;  Surgeon: Alison Needy, MD;  Location: [facility] INVASIVE CV LAB;  Service: Cardiovascular;  Laterality: N/A;   LOWER EXTREMITY INTERVENTION Right 04/22/2019   Procedure: IVC Filter Insertion with Right Lower Extremity Venous Lysis;  Surgeon: Alison Needy, MD;  Location: [facility] INVASIVE CV LAB;  Service: Cardiovascular;  Laterality: Right;   OOPHORECTOMY      Allergies: Allergies as of 01/29/2024 - Review Complete 01/29/2024  Allergen Reaction Noted   Morphine and codeine Anaphylaxis 04/21/2019   Montelukast  02/23/2017   Antihistamines, diphenhydramine-type Nausea Only 06/10/2021   Aspirin Other (See Comments) 12/29/2016   Benadryl [diphenhydramine hcl] Hives 12/29/2016   Codeine Other (See Comments) 04/21/2019    Medications: No outpatient medications have been marked as taking for the 01/29/24 encounter [facility] Encounter).    Social History: Social History   Tobacco Use   Smoking status: Former    Current packs/day: 0.00    Average packs/day: 1 pack/day for 17.0 years (17.0 ttl pk-yrs)    Types: Cigarettes    Start date: 40  Quit date: 2002    Years since quitting: 23.2   Smokeless tobacco: Never  Vaping Use   Vaping status: Never Used  Substance Use Topics   Alcohol use: Not Currently   Drug use: Never    Family Medical History: Family History  Problem Relation Age of Onset   Stomach cancer Mother    Heart attack Father    Breast cancer Neg Hx     Physical Examination: Vitals:   01/29/24 1149  BP: (!) 139/49  Pulse: 80  Resp: 16   Temp: 98.4 F (36.9 C)  SpO2: 98%    General: Patient is in no apparent distress. Attention to examination is diminished Neck:   Supple.  Full range of motion.  Respiratory: Patient is breathing without any difficulty.   NEUROLOGICAL:     Awake, alert, oriented to person and place.  Her speech is sparse.  She does answer some questions.  Cranial Nerves: Pupils equal round and reactive to light.  Facial tone is symmetric.  Facial sensation is symmetric. Shoulder shrug is symmetric. Tongue protrusion is midline.  She has right-sided pronator drift Strength:  Side Biceps Triceps Deltoid Interossei Grip Wrist Ext. Wrist Flex.  R 4 4 4  4- 4- 4 4  L 5 5 5 5 5 5 5    Side Iliopsoas Quads Hamstring PF DF EHL  R 5 5 5 5 5  -  L 5 5 5 5 5  -    Sensory examination is unreliable   gait is untested.     Medical Decision Making  Imaging: CT head 01/29/2024  IMPRESSION: 1. Positive for bilateral mixed density Subdural Hematomas, somewhat biconvex and with 17 mm maximum thickness on the Left and 14 mm maximum thickness on the Right. Subsequent mass effect on both hemispheres although no significant midline shift. Basilar cisterns remain patent.   2. No skull fracture or other acute intracranial abnormality identified. 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   Electronically Signed: By: Alison Lopez M.D. On: 01/29/2024 12:57    I have personally reviewed the images and agree with the above interpretation.  CBC    Component Value Date/Time   WBC 7.7 01/29/2024 1307   RBC 4.12 01/29/2024 1307   HGB 12.9 01/29/2024 1307   HCT 38.8 01/29/2024 1307   PLT 52 (L) 01/29/2024 1307   MCV 94.2 01/29/2024 1307   MCH 31.3 01/29/2024 1307   MCHC 33.2 01/29/2024 1307   RDW 13.0 01/29/2024 1307   LYMPHSABS 1.3 01/29/2024 1307   MONOABS 0.7 01/29/2024 1307   EOSABS 0.1 01/29/2024 1307   BASOSABS 0.1 01/29/2024 1307   INR/Prothrombin  Time 1.2  Assessment and Lopez: Alison Lopez is a pleasant 68 y.o. female with decreased mental status and right-sided weakness with new diagnosis of bilateral chronic subdural hematomas.  She has recently been on therapeutic dosing of Eliquis.  She also suffers from chronic thrombocytopenia.  Based on her recent decline in status and her compression from the subdural hematomas, I recommended bilateral bur hole for subdural hematoma evacuation.  Due to her recent dosing of Eliquis and her current stable but altered neurologic condition, I have recommended deferring drainage until tomorrow when she will be 48 hours since her last dose of Eliquis.  The patient is unable to give consent for the procedure, but her daughter and caretaker Alison Lopez is present.  I discussed the planned procedure at length with the patient's daughter, including the risks, benefits, alternatives, and  indications. The risks discussed include but are not limited to bleeding, infection, need for reoperation, spinal fluid leak, stroke, vision loss, anesthetic complication, coma, paralysis, and even death. I also described in detail that improvement was not guaranteed.  The patient's daughter expressed understanding of these risks, and asked that we proceed with surgery. I described the surgery in layman's terms, and gave ample opportunity for questions, which were answered to the best of my ability.  I have communicated my recommendations to the requesting physician and coordinated care to facilitate these recommendations.  Dr. Sedalia Muta and I agree that admission to the stepdown unit was appropriate for close monitoring.  She will discuss strategies for optimizing her platelet count prior to surgery with the hematologist on-call.     Alison Saber K. Myer Haff MD, [facility] Neurosurgery

## 2024-01-29 NOTE — Assessment & Plan Note (Addendum)
 Sertraline 25 mg daily resumed PDMP reviewed, no current active controlled substance prescription at this time

## 2024-01-29 NOTE — ED Notes (Signed)
 Pink top sent down.

## 2024-01-29 NOTE — ED Notes (Signed)
 IV team at bedside

## 2024-01-29 NOTE — Progress Notes (Signed)
   01/29/24 1745  Spiritual Encounters  Type of Visit Initial  Care provided to: Pt and family  Conversation partners present during encounter Nurse  Referral source Nurse (RN/NT/LPN)  Reason for visit Advance directives  OnCall Visit Yes  Interventions  Spiritual Care Interventions Made Established relationship of care and support  Intervention Outcomes  Outcomes Connection to spiritual care  Spiritual Care Plan  Spiritual Care Issues Still Outstanding No further spiritual care needs at this time (see row info)  Advance Directives (For Healthcare)  Does Patient Have a Medical Advance Directive? Unable to assess, patient is non-responsive or altered mental status

## 2024-01-30 ENCOUNTER — Inpatient Hospital Stay: Admitting: Anesthesiology

## 2024-01-30 ENCOUNTER — Encounter
Admission: EM | Disposition: A | Discharge status: Rehabilitation Facility | Payer: Self-pay | Source: Home / Self Care | Attending: Internal Medicine

## 2024-01-30 ENCOUNTER — Other Ambulatory Visit: Payer: Self-pay

## 2024-01-30 DIAGNOSIS — G935 Compression of brain: Secondary | ICD-10-CM | POA: Diagnosis not present

## 2024-01-30 DIAGNOSIS — Z7901 Long term (current) use of anticoagulants: Secondary | ICD-10-CM | POA: Diagnosis not present

## 2024-01-30 DIAGNOSIS — D6861 Antiphospholipid syndrome: Secondary | ICD-10-CM

## 2024-01-30 DIAGNOSIS — F0390 Unspecified dementia without behavioral disturbance: Secondary | ICD-10-CM

## 2024-01-30 DIAGNOSIS — S065XAA Traumatic subdural hemorrhage with loss of consciousness status unknown, initial encounter: Secondary | ICD-10-CM | POA: Diagnosis not present

## 2024-01-30 DIAGNOSIS — J449 Chronic obstructive pulmonary disease, unspecified: Secondary | ICD-10-CM

## 2024-01-30 HISTORY — PX: BURR HOLE: SHX908

## 2024-01-30 LAB — BPAM PLATELET PHERESIS
Blood Product Expiration Date: 202503232359
Blood Product Expiration Date: 202503252359
ISSUE DATE / TIME: 202503211740
Unit Type and Rh: 5100
Unit Type and Rh: 6200

## 2024-01-30 LAB — CBC
HCT: 32.9 % — ABNORMAL LOW (ref 36.0–46.0)
Hemoglobin: 11.3 g/dL — ABNORMAL LOW (ref 12.0–15.0)
MCH: 31 pg (ref 26.0–34.0)
MCHC: 34.3 g/dL (ref 30.0–36.0)
MCV: 90.1 fL (ref 80.0–100.0)
Platelets: 78 10*3/uL — ABNORMAL LOW (ref 150–400)
RBC: 3.65 MIL/uL — ABNORMAL LOW (ref 3.87–5.11)
RDW: 12.6 % (ref 11.5–15.5)
WBC: 7.2 10*3/uL (ref 4.0–10.5)
nRBC: 0 % (ref 0.0–0.2)

## 2024-01-30 LAB — BASIC METABOLIC PANEL
Anion gap: 8 (ref 5–15)
BUN: 19 mg/dL (ref 8–23)
CO2: 22 mmol/L (ref 22–32)
Calcium: 8.7 mg/dL — ABNORMAL LOW (ref 8.9–10.3)
Chloride: 104 mmol/L (ref 98–111)
Creatinine, Ser: 0.98 mg/dL (ref 0.44–1.00)
GFR, Estimated: >60 mL/min (ref >60)
Glucose, Bld: 176 mg/dL — ABNORMAL HIGH (ref 70–99)
Potassium: 4.4 mmol/L (ref 3.5–5.1)
Sodium: 134 mmol/L — ABNORMAL LOW (ref 135–145)

## 2024-01-30 LAB — PREPARE PLATELET PHERESIS
Unit division: 0
Unit division: 0

## 2024-01-30 LAB — GLUCOSE, CAPILLARY
Glucose-Capillary: 123 mg/dL — ABNORMAL HIGH (ref 70–99)
Glucose-Capillary: 130 mg/dL — ABNORMAL HIGH (ref 70–99)
Glucose-Capillary: 160 mg/dL — ABNORMAL HIGH (ref 70–99)
Glucose-Capillary: 150 mg/dL — ABNORMAL HIGH (ref 70–99)

## 2024-01-30 LAB — HEMOGLOBIN A1C
Hgb A1c MFr Bld: 4.8 % (ref 4.8–5.6)
Mean Plasma Glucose: 91.06 mg/dL

## 2024-01-30 SURGERY — CREATION, CRANIAL BURR HOLE
Anesthesia: General | Site: Head | Laterality: Bilateral

## 2024-01-30 MED ORDER — CEFAZOLIN SODIUM-DEXTROSE 2-4 GM/100ML-% IV SOLN
INTRAVENOUS | Status: AC
  Filled 2024-01-30: qty 100

## 2024-01-30 MED ORDER — PROPOFOL 10 MG/ML IV BOLUS
INTRAVENOUS | Status: DC | PRN
  Administered 2024-01-30: 20 mg via INTRAVENOUS
  Administered 2024-01-30: 80 mg via INTRAVENOUS
  Administered 2024-01-30: 50 mg via INTRAVENOUS

## 2024-01-30 MED ORDER — ACETAMINOPHEN 10 MG/ML IV SOLN
1000.0000 mg | Freq: Once | INTRAVENOUS | Status: DC | PRN
  Administered 2024-01-30: 1000 mg via INTRAVENOUS

## 2024-01-30 MED ORDER — CEFAZOLIN SODIUM-DEXTROSE 2-3 GM-%(50ML) IV SOLR
INTRAVENOUS | Status: DC | PRN
Start: 2024-01-30 — End: 2024-01-30
  Administered 2024-01-30: 2 g via INTRAVENOUS

## 2024-01-30 MED ORDER — 0.9 % SODIUM CHLORIDE (POUR BTL) OPTIME
TOPICAL | Status: DC | PRN
  Administered 2024-01-30: 500 mL

## 2024-01-30 MED ORDER — DEXAMETHASONE SODIUM PHOSPHATE 10 MG/ML IJ SOLN
INTRAMUSCULAR | Status: AC
  Filled 2024-01-30: qty 1

## 2024-01-30 MED ORDER — MIDAZOLAM HCL 2 MG/2ML IJ SOLN
INTRAMUSCULAR | Status: AC
  Filled 2024-01-30: qty 2

## 2024-01-30 MED ORDER — LIDOCAINE HCL (CARDIAC) PF 100 MG/5ML IV SOSY
PREFILLED_SYRINGE | INTRAVENOUS | Status: DC | PRN
  Administered 2024-01-30: 60 mg via INTRAVENOUS

## 2024-01-30 MED ORDER — ACETAMINOPHEN 10 MG/ML IV SOLN
INTRAVENOUS | Status: AC
  Filled 2024-01-30: qty 100

## 2024-01-30 MED ORDER — FENTANYL CITRATE (PF) 100 MCG/2ML IJ SOLN
INTRAMUSCULAR | Status: DC | PRN
  Administered 2024-01-30 (×2): 50 ug via INTRAVENOUS

## 2024-01-30 MED ORDER — BACITRACIN 500 UNIT/GM EX OINT
TOPICAL_OINTMENT | CUTANEOUS | Status: DC | PRN
  Administered 2024-01-30: 1 via TOPICAL

## 2024-01-30 MED ORDER — ROCURONIUM BROMIDE 100 MG/10ML IV SOLN
INTRAVENOUS | Status: DC | PRN
  Administered 2024-01-30: 10 mg via INTRAVENOUS
  Administered 2024-01-30: 30 mg via INTRAVENOUS

## 2024-01-30 MED ORDER — PROPOFOL 10 MG/ML IV BOLUS
INTRAVENOUS | Status: AC
  Filled 2024-01-30: qty 20

## 2024-01-30 MED ORDER — ROCURONIUM BROMIDE 10 MG/ML (PF) SYRINGE
PREFILLED_SYRINGE | INTRAVENOUS | Status: AC
  Filled 2024-01-30: qty 10

## 2024-01-30 MED ORDER — SUGAMMADEX SODIUM 200 MG/2ML IV SOLN
INTRAVENOUS | Status: DC | PRN
  Administered 2024-01-30: 100 mg via INTRAVENOUS

## 2024-01-30 MED ORDER — FENTANYL CITRATE (PF) 100 MCG/2ML IJ SOLN
INTRAMUSCULAR | Status: AC
Start: 2024-01-30 — End: ?
  Filled 2024-01-30: qty 2

## 2024-01-30 MED ORDER — DEXAMETHASONE SODIUM PHOSPHATE 10 MG/ML IJ SOLN
INTRAMUSCULAR | Status: DC | PRN
  Administered 2024-01-30: 5 mg via INTRAVENOUS

## 2024-01-30 MED ORDER — ONDANSETRON HCL 4 MG/2ML IJ SOLN
INTRAMUSCULAR | Status: DC | PRN
  Administered 2024-01-30: 4 mg via INTRAVENOUS

## 2024-01-30 MED ORDER — LIDOCAINE HCL (PF) 2 % IJ SOLN
INTRAMUSCULAR | Status: AC
  Filled 2024-01-30: qty 5

## 2024-01-30 MED ORDER — ONDANSETRON HCL 4 MG/2ML IJ SOLN
INTRAMUSCULAR | Status: AC
  Filled 2024-01-30: qty 2

## 2024-01-30 MED ORDER — PHENYLEPHRINE 80 MCG/ML (10ML) SYRINGE FOR IV PUSH (FOR BLOOD PRESSURE SUPPORT)
PREFILLED_SYRINGE | INTRAVENOUS | Status: DC | PRN
  Administered 2024-01-30: 160 ug via INTRAVENOUS
  Administered 2024-01-30 (×2): 80 ug via INTRAVENOUS
  Administered 2024-01-30: 160 ug via INTRAVENOUS

## 2024-01-30 MED ORDER — EPHEDRINE SULFATE-NACL 50-0.9 MG/10ML-% IV SOSY
PREFILLED_SYRINGE | INTRAVENOUS | Status: DC | PRN
  Administered 2024-01-30: 5 mg via INTRAVENOUS

## 2024-01-30 MED ORDER — LIDOCAINE-EPINEPHRINE 1 %-1:100000 IJ SOLN
INTRAMUSCULAR | Status: DC | PRN
  Administered 2024-01-30: 5 mL

## 2024-01-30 MED ORDER — FENTANYL CITRATE (PF) 100 MCG/2ML IJ SOLN
INTRAMUSCULAR | Status: AC
  Filled 2024-01-30: qty 2

## 2024-01-30 SURGICAL SUPPLY — 44 items
BENZOIN TINCTURE PRP APPL 2/3 (GAUZE/BANDAGES/DRESSINGS) IMPLANT
BLADE CLIPPER SPEC (BLADE) ×1 IMPLANT
BUR ACORN 7.5 PRECISION (BURR) ×1 IMPLANT
CORD BIP STRL DISP 12FT (MISCELLANEOUS) IMPLANT
DRAPE WARM FLUID 44X44 (DRAPES) IMPLANT
DRSG TEGADERM 4X4.75 (GAUZE/BANDAGES/DRESSINGS) IMPLANT
DRSG TELFA 3X4 N-ADH STERILE (GAUZE/BANDAGES/DRESSINGS) ×1 IMPLANT
DRSG TELFA 3X8 NADH STRL (GAUZE/BANDAGES/DRESSINGS) IMPLANT
ELECT CAUTERY BLADE TIP 2.5 (TIP) ×1 IMPLANT
ELECT REM PT RETURN 9FT ADLT (ELECTROSURGICAL) ×1 IMPLANT
ELECTRODE CAUTERY BLDE TIP 2.5 (TIP) IMPLANT
ELECTRODE REM PT RTRN 9FT ADLT (ELECTROSURGICAL) ×1 IMPLANT
GAUZE SPONGE 4X4 12PLY STRL (GAUZE/BANDAGES/DRESSINGS) IMPLANT
GLOVE BIOGEL PI IND STRL 6.5 (GLOVE) ×1 IMPLANT
GLOVE SURG SYN 6.5 ES PF (GLOVE) ×1 IMPLANT
GLOVE SURG SYN 6.5 PF PI (GLOVE) ×1 IMPLANT
GLOVE SURG SYN 8.5 E (GLOVE) ×6 IMPLANT
GLOVE SURG SYN 8.5 PF PI (GLOVE) ×6 IMPLANT
GOWN SRG LRG LVL 4 IMPRV REINF (GOWNS) ×1 IMPLANT
GOWN SRG XL LVL 3 NONREINFORCE (GOWNS) ×1 IMPLANT
HOLDER FOLEY CATH W/STRAP (MISCELLANEOUS) IMPLANT
KIT DRAIN CSF ACCUDRAIN (MISCELLANEOUS) ×1 IMPLANT
KIT TURNOVER KIT A (KITS) ×1 IMPLANT
MANIFOLD NEPTUNE II (INSTRUMENTS) ×1 IMPLANT
MAT ABSORB FLUID 56X50 GRAY (MISCELLANEOUS) ×1 IMPLANT
NDL HYPO 25GX1X1/2 BEV (NEEDLE) ×1 IMPLANT
NEEDLE HYPO 25GX1X1/2 BEV (NEEDLE) ×1 IMPLANT
PACK LAMINECTOMY ARMC (PACKS) ×1 IMPLANT
PAD ARMBOARD POSITIONER FOAM (MISCELLANEOUS) ×2 IMPLANT
PIN MAYFIELD SKULL DISP (PIN) IMPLANT
SCRUB TECHNI CARE 4 OZ NO DYE (MISCELLANEOUS) ×1 IMPLANT
SET CATH VENT DRAIN 3-15 1.9D (DRAIN) ×1 IMPLANT
SHEET NEURO XL SOL CTL (MISCELLANEOUS) ×1 IMPLANT
STAPLER SKIN PROX 35W (STAPLE) ×2 IMPLANT
STRIP CLOSURE SKIN 1/2X4 (GAUZE/BANDAGES/DRESSINGS) ×1 IMPLANT
SURGIFLO W/THROMBIN 8M KIT (HEMOSTASIS) IMPLANT
SUT MNCRL AB 3-0 PS2 27 (SUTURE) ×1 IMPLANT
SUT SILK 2 0 SH CR/8 (SUTURE) ×1 IMPLANT
SUT VIC AB 3-0 SH 8-18 (SUTURE) ×1 IMPLANT
SUT VICRYL 3-0 CR8 SH (SUTURE) IMPLANT
TRAP FLUID SMOKE EVACUATOR (MISCELLANEOUS) ×1 IMPLANT
TRAY FOLEY SLVR 16FR LF STAT (SET/KITS/TRAYS/PACK) IMPLANT
TUBING ART PRESS 48 MALE/FEM (TUBING) IMPLANT
WATER STERILE IRR 1000ML POUR (IV SOLUTION) ×4 IMPLANT

## 2024-01-30 NOTE — Op Note (Signed)
 Indications: Alison Lopez is suffering from a chronic subdural hematoma causing significant brain compression and symptoms, prompting surgical intervention.   Findings: subdural hematoma  Preoperative Diagnosis: Subdural hematoma Postoperative Diagnosis: same   EBL: 10 ml IVF: see AR Drains: subdural drain placed on each side Disposition: Extubated and Stable to PACU Complications: none  A foley catheter was placed.   Preoperative Note:   Risks of surgery discussed include: infection, bleeding, stroke, coma, death, paralysis, CSF leak, weakness, need for further surgery, persistent symptoms, and the risks of anesthesia. The patient's daughter understood these risks and agreed to proceed.  Operative Note:   Procedure:  1) bilateral frontal burr hole for drainage of subdural hematoma   Procedure: After obtaining informed consent, the patient taken to the operating room, placed in supine position, general anesthesia induced.  The head was placed on a horseshoe.  A linear incision was marked 5 cm lateral to the midline on each side on the coronal suture.  The hair was clipped.  The operative site was prepped and draped.  A timeout was performed.    The incision was injected with local.  A linear incision was made and carried to the skull.  The pericranium was reflected laterally and a small self-retaining retractor placed.  The drill was used to make a burr hole.  The dura was coagulated, then divided with a #15 blade.  A membrane was encountered and opened.    Dark brown subdural fluid was encountered.  A bactiseal ventriculostomy catheter was placed into the subdural space, then tunneled medially and posteriorly approximately 5 cm from the incision.  The connector was placed and secured.  Brisk drainage was noted.  The same procedure was the performed contralaterally such that bilateral drains had been placed.  The primary incision was irrigated, then closed with vicryl and  staples.  The drain was secured to the skin, then a 3-0 monocryl was placed for future closure of the drainage hole, and tacked to the drain with a steristrip.  The drainage system was flushed, then attached to the drain.  Good drainage was noted.    Sterile dressings were placed.  Sponge and pattie counts were correct at the end of the procedure.   I performed the procedure.   Venetia Night MD

## 2024-01-30 NOTE — Anesthesia Postprocedure Evaluation (Signed)
 Anesthesia Post Note  Patient: Alison Lopez  Procedure(s) Performed: CREATION, CRANIAL BURR HOLE, SUBDURAL HEMATOMA EVACUATIOIN (Bilateral: Head)  Patient location during evaluation: PACU Anesthesia Type: General Level of consciousness: awake, patient cooperative and confused Pain management: pain level controlled Vital Signs Assessment: post-procedure vital signs reviewed and stable Respiratory status: spontaneous breathing, nonlabored ventilation and respiratory function stable Cardiovascular status: blood pressure returned to baseline and stable Postop Assessment: no apparent nausea or vomiting Anesthetic complications: no   No notable events documented.   Last Vitals:  Vitals:   01/30/24 1042 01/30/24 1100  BP: (!) 142/75 125/60  Pulse: 83 82  Resp: 18 13  Temp:    SpO2: 97% 97%    Last Pain:  Vitals:   01/30/24 1030  TempSrc:   PainSc: 0-No pain                 Foye Deer

## 2024-01-30 NOTE — Interval H&P Note (Signed)
 History and Physical Interval Note:  01/30/2024 8:15 AM  [name]  has presented today for surgery, with the diagnosis of subdural hematoma.  The various methods of treatment have been discussed with the patient and family. After consideration of risks, benefits and other options for treatment, the patient has consented to  Procedure(s) with comments: CREATION, CRANIAL BURR HOLE (Bilateral)  as a surgical intervention.  The patient's history has been reviewed, patient examined, no change in status, stable for surgery.  I have reviewed the patient's chart and labs.  Questions were answered to the patient's daughter's satisfaction.    Heart sounds normal no MRG. Chest Clear to Auscultation Bilaterally.   Alison Lopez

## 2024-01-30 NOTE — Transfer of Care (Signed)
 Immediate Anesthesia Transfer of Care Note  Patient: Alison Lopez  Procedure(s) Performed: CREATION, CRANIAL BURR HOLE, SUBDURAL HEMATOMA EVACUATIOIN (Bilateral: Head)  Patient Location: PACU  Anesthesia Type:General  Level of Consciousness: awake, drowsy, and pateint uncooperative  Airway & Oxygen Therapy: Patient Spontanous Breathing and Patient connected to face mask oxygen  Post-op Assessment: Report given to RN  Post vital signs: Reviewed and stable  Last Vitals:  Vitals Value Taken Time  BP 148/110 01/30/24 1006  Temp    Pulse 108 01/30/24 1009  Resp 10 01/30/24 1009  SpO2 99 % 01/30/24 1009  Vitals shown include unfiled device data.  Last Pain:  Vitals:   01/30/24 0730  TempSrc: Oral  PainSc: 0-No pain         Complications: No notable events documented.

## 2024-01-30 NOTE — Progress Notes (Signed)
 Left and right external ventricular drains having zero output. Dr Myer Haff contacted with update on pt condition, per orders. Drains remain in place, and neurological assessment remains unchanged.

## 2024-01-30 NOTE — Plan of Care (Signed)
  Problem: Education: Goal: Ability to describe self-care measures that may prevent or decrease complications (Diabetes Survival Skills Education) will improve Outcome: Progressing   Problem: Coping: Goal: Ability to adjust to condition or change in health will improve Outcome: Progressing   Problem: Fluid Volume: Goal: Ability to maintain a balanced intake and output will improve Outcome: Progressing   Problem: Metabolic: Goal: Ability to maintain appropriate glucose levels will improve Outcome: Progressing   Problem: Skin Integrity: Goal: Risk for impaired skin integrity will decrease Outcome: Progressing   Problem: Tissue Perfusion: Goal: Adequacy of tissue perfusion will improve Outcome: Progressing   Problem: Clinical Measurements: Goal: Will remain free from infection Outcome: Progressing Goal: Diagnostic test results will improve Outcome: Progressing Goal: Respiratory complications will improve Outcome: Progressing Goal: Cardiovascular complication will be avoided Outcome: Progressing   Problem: Coping: Goal: Level of anxiety will decrease Outcome: Progressing   Problem: Skin Integrity: Goal: Risk for impaired skin integrity will decrease Outcome: Progressing   Problem: Clinical Measurements: Goal: Will remain free from infection Outcome: Progressing Goal: Diagnostic test results will improve Outcome: Progressing Goal: Respiratory complications will improve Outcome: Progressing   Problem: Activity: Goal: Risk for activity intolerance will decrease Outcome: Progressing

## 2024-01-30 NOTE — Anesthesia Procedure Notes (Signed)
 Procedure Name: Intubation Date/Time: 01/30/2024 8:31 AM  Performed by: Clydia Llano, CRNAPre-anesthesia Checklist: Patient identified, Emergency Drugs available, Suction available and Patient being monitored Patient Re-evaluated:Patient Re-evaluated prior to induction Oxygen Delivery Method: Circle system utilized Preoxygenation: Pre-oxygenation with 100% oxygen Induction Type: IV induction Ventilation: Mask ventilation without difficulty Grade View: Grade I Tube type: Oral Tube size: 7.0 mm Number of attempts: 1 Placement Confirmation: ETT inserted through vocal cords under direct vision Secured at: 20 cm Tube secured with: Tape Dental Injury: Teeth and Oropharynx as per pre-operative assessment

## 2024-01-30 NOTE — Progress Notes (Signed)
 [facility]  - [place] at [facility]   PATIENT NAME: Alison Lopez    MR#:  [id number]  DATE OF BIRTH:  07/22/1956  SUBJECTIVE:  no family at bedside. Patient came in after she was found to have altered mental status noted by family. She is status post surgery to evacuate her hematomas. Sedated. Vitals stable.    VITALS:  Blood pressure 117/69, pulse 83, temperature 98 F (36.7 C), temperature source Oral, resp. rate 17, height 4\' 9"  (1.448 m), weight 49.9 kg, SpO2 97%.  PHYSICAL EXAMINATION:  limited GENERAL:  68 y.o.-year-old patient with no acute distress.  LUNGS: Normal breath sounds bilaterally, no wheezing CARDIOVASCULAR: S1, S2 normal. No murmur   ABDOMEN: Soft, nontender, nondistended.  EXTREMITIES: No  edema b/l.    NEUROLOGIC unable to assess. Patient sedated from surgery  bilateral drains present on the scalp   LABORATORY PANEL:  CBC Recent Labs  Lab 01/30/24 0311  WBC 7.2  HGB 11.3*  HCT 32.9*  PLT 78*    Chemistries  Recent Labs  Lab 01/29/24 1307 01/30/24 0311  NA 140 134*  K 4.2 4.4  CL 107 104  CO2 24 22  GLUCOSE 135* 176*  BUN 14 19  CREATININE 0.99 0.98  CALCIUM 9.1 8.7*  AST 15  --   ALT 9  --   ALKPHOS 39  --   BILITOT 0.5  --    Cardiac Enzymes No results for input(s): "TROPONINI" in the last 168 hours. RADIOLOGY:  CT Cervical Spine Wo Contrast Result Date: 01/29/2024 CLINICAL DATA:  Right upper and lower extremity weakness. Abrupt change. Neck trauma. EXAM: CT CERVICAL SPINE WITHOUT CONTRAST TECHNIQUE: Multidetector CT imaging of the cervical spine was performed without intravenous contrast. Multiplanar CT image reconstructions were also generated. RADIATION DOSE REDUCTION: This exam was performed according to the departmental dose-optimization program which includes automated exposure control, adjustment of the mA and/or kV according to patient size and/or use of iterative reconstruction technique. COMPARISON:  None  Available. FINDINGS: Alignment: Slight degenerative anterolisthesis is present at C4-5. No other significant listhesis is present. Straightening of the normal cervical lordosis is present. Rightward curvature of the cervical spine is centered at C6. Skull base and vertebrae: The craniocervical junction is normal. Vertebral body heights are normal. No acute fractures are present. Soft tissues and spinal canal: The soft tissues of the neck are otherwise unremarkable. Salivary glands are within normal limits. Thyroid is normal. No significant adenopathy is present. No focal mucosal or submucosal lesions are present. Disc levels: Chronic endplate degenerative changes are present at C5-6. Uncovertebral spurring leads to moderate left and mild right foraminal stenosis. 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 Upper chest: The lung apices are clear. The thoracic inlet is within normal limits. IMPRESSION: 1. No acute or focal lesion to explain the patient's symptoms. 2. Chronic endplate degenerative changes at C5-6 with moderate left and mild right foraminal stenosis. 3. 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 Electronically Signed   By: Marin Roberts M.D.   On: 01/29/2024 15:08   CT HEAD WO CONTRAST Addendum Date: 01/29/2024 ADDENDUM REPORT: 01/29/2024 13:05 ADDENDUM: Study discussed by telephone with Dr. Artis Delay on 01/29/2024 at 1259 hours. Electronically Signed   By: Odessa Fleming M.D.   On: 01/29/2024 13:05   Result Date: 01/29/2024 CLINICAL DATA:  68 year old female with abrupt onset altered mental status, incontinence, not moving right arm. History of dementia. EXAM: CT HEAD WITHOUT CONTRAST TECHNIQUE: Contiguous axial images were obtained from the base of  the skull through the vertex without intravenous contrast. RADIATION DOSE REDUCTION: This exam was performed according to the departmental dose-optimization program which includes automated exposure control, adjustment of the mA and/or kV according to patient  size and/or use of iterative reconstruction technique. COMPARISON:  Head CT 02/13/2005. FINDINGS: Brain: New since 2006 bihemispheric hypodense subdural hematomas with intracranial mass effect. Both collections are fairly uniformly hypodense throughout much of the hemisphere but there is layering hyperdense blood posteriorly on both sides (series 2, image 17). Both collections are somewhat biconvex. That on the left measures up to 17 mm in maximum thickness (coronal image 42), on the right 14 mm in maximum thickness. Questionable trace para falcine hyperdense blood products also on the left coronal image 17. But no other convincing multi spatial hemorrhage. Subsequently there is no significant midline shift. Mass effect on the lateral ventricles but the basilar cisterns remain patent. Incidental choroid plexus cysts (normal variant). Chronic appearing encephalomalacia in both occipital poles (series 2, image 15), new since 2006. No cortically based acute infarct identified. And multiple small chronic appearing cerebellar infarcts more numerous on the left. Other patchy bilateral cerebral white matter hypodensity. Vascular: Calcified atherosclerosis at the skull base. No suspicious intracranial vascular hyperdensity. Skull: Intact.  No acute osseous abnormality identified. Sinuses/Orbits: Visualized paranasal sinuses and mastoids are stable and well aerated. Other: Visualized orbits and scalp soft tissues are within normal limits. IMPRESSION: 1. Positive for bilateral mixed density Subdural Hematomas, somewhat biconvex and with 17 mm maximum thickness on the Left and 14 mm maximum thickness on the Right. Subsequent mass effect on both hemispheres although no significant midline shift. Basilar cisterns remain patent. 2. No skull fracture or other acute intracranial abnormality identified. 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 Electronically Signed: By: Odessa Fleming M.D. On:  01/29/2024 12:57    Assessment and Plan   Chabeli Barsamian is a 68 year old female with dementia, history of DVT on Eliquis, chronic thrombocytopenia, B12 deficiency, hypertension, who presents emergency department for chief concerns of altered mental status.  Daughter last saw her on Thursday around 5 PM. Patient reports that on Thursday evening, patient went to bed and urinated on herself and defecated on himself. This is not normal for her.   CT head wo contrast: Was read as positive bilateral mixed density subdural hematomas, somewhat biconvex, 17 mm maximum thickness on the left and 14 mm maximum thickness on the right.   Bilateral subdural hematomas [facility]) --Neurosurgery has been consulted by EDP, Dr. Marcell Barlow is aware --Hold Eliquis -- patient is status post bilateral frontal burr hole for drainage of subdural hematoma with subdural drains placed on each side -- CT scan of the head to be done tomorrow   chronic thrombocytopenia (HCC) --Goal platelet > 75 prior to neurosurgery in the AM --Discussed with hematologist, Dr. Cathie Hoops --Decadron 40 mg IV, daily, 4 days ordered on admission --plt 52K--1 unit --78K  Type 2 diabetes mellitus without complication (HCC) --Insulin SSI with at bedtime coverage ordered --Goal inpatient blood glucose levels 140-180  Antiphospholipid antibody syndrome -- previously failed Coumadin -- recently started seeing hematology and repeat APS labs consistent with the diagnosis. 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  >50% time spent on counselling and coordination of care  Note: This dictation was prepared with Dragon dictation along with smaller phrase technology. Any transcriptional errors that result from this process are unintentional.  Enedina Finner M.D    [facility]   CC: Primary care physician; Rayetta Humphrey, MD

## 2024-01-31 ENCOUNTER — Inpatient Hospital Stay

## 2024-01-31 DIAGNOSIS — Z7901 Long term (current) use of anticoagulants: Secondary | ICD-10-CM | POA: Diagnosis not present

## 2024-01-31 DIAGNOSIS — D6861 Antiphospholipid syndrome: Secondary | ICD-10-CM | POA: Diagnosis not present

## 2024-01-31 DIAGNOSIS — S065XAA Traumatic subdural hemorrhage with loss of consciousness status unknown, initial encounter: Secondary | ICD-10-CM | POA: Diagnosis not present

## 2024-01-31 DIAGNOSIS — F0390 Unspecified dementia without behavioral disturbance: Secondary | ICD-10-CM | POA: Diagnosis not present

## 2024-01-31 LAB — GLUCOSE, CAPILLARY
Glucose-Capillary: 102 mg/dL — ABNORMAL HIGH (ref 70–99)
Glucose-Capillary: 122 mg/dL — ABNORMAL HIGH (ref 70–99)
Glucose-Capillary: 243 mg/dL — ABNORMAL HIGH (ref 70–99)
Glucose-Capillary: 115 mg/dL — ABNORMAL HIGH (ref 70–99)

## 2024-01-31 MED ORDER — ENOXAPARIN SODIUM 40 MG/0.4ML IJ SOSY
40.0000 mg | PREFILLED_SYRINGE | INTRAMUSCULAR | Status: DC
  Administered 2024-01-31 – 2024-02-07 (×8): 40 mg via SUBCUTANEOUS
  Filled 2024-01-31 (×8): qty 0.4

## 2024-01-31 NOTE — Progress Notes (Signed)
 [facility]  - [place] at [facility]   PATIENT NAME: Alison Lopez    MR#:  [id number]  DATE OF BIRTH:  1956/03/04  SUBJECTIVE:  patient's daughter at bedside. Patient came in after she was found to have altered mental status noted by family.  Patient more awake and able to communicate some. Has dysarthria speech. Has right-sided weakness.   VITALS:  Blood pressure (!) 132/94, pulse 67, temperature 97.8 F (36.6 C), temperature source Oral, resp. rate 15, height 4\' 9"  (1.448 m), weight 49.9 kg, SpO2 99%.  PHYSICAL EXAMINATION:  limited GENERAL:  68 y.o.-year-old patient with no acute distress.  LUNGS: Normal breath sounds bilaterally, no wheezing CARDIOVASCULAR: S1, S2 normal. No murmur   ABDOMEN: Soft, nontender, nondistended.  EXTREMITIES: No  edema b/l.    NEUROLOGIC dysarthria, right upper and lower extremity weakness. Bilateral drains now removed   LABORATORY PANEL:  CBC Recent Labs  Lab 01/30/24 0311  WBC 7.2  HGB 11.3*  HCT 32.9*  PLT 78*    Chemistries  Recent Labs  Lab 01/29/24 1307 01/30/24 0311  NA 140 134*  K 4.2 4.4  CL 107 104  CO2 24 22  GLUCOSE 135* 176*  BUN 14 19  CREATININE 0.99 0.98  CALCIUM 9.1 8.7*  AST 15  --   ALT 9  --   ALKPHOS 39  --   BILITOT 0.5  --    Cardiac Enzymes No results for input(s): "TROPONINI" in the last 168 hours. RADIOLOGY:  CT HEAD WO CONTRAST ( ) Result Date: 01/31/2024 CLINICAL DATA:  68 year old female with altered mental status and bilateral subdural hematomas at presentation. Postoperative now bilateral subdural evacuation. EXAM: CT HEAD WITHOUT CONTRAST TECHNIQUE: Contiguous axial images were obtained from the base of the skull through the vertex without intravenous contrast. RADIATION DOSE REDUCTION: This exam was performed according to the departmental dose-optimization program which includes automated exposure control, adjustment of the mA and/or kV according to patient size and/or use  of iterative reconstruction technique. COMPARISON:  Head CT 01/29/2024. FINDINGS: Brain: Postoperative changes to both subdural spaces with bilateral subdural drains in place. Associated pneumocephalus. Decreased volume of right side subdural hematoma, residual now only 4-5 mm at most levels (previously up to 14 mm). Decreased volume of left side subdural hematoma, more mixed density now and residual up to maximum thickness of 11 mm (previously 17 mm). Subsequent decreased intracranial mass effect and improved ventricle size and configuration. Mild rightward midline shift now 1-2 mm (coronal image 35). Small volume left para falcine subdural blood and pneumocephalus also. Basilar cisterns are patent and mildly improved. No IVH or new areas of intracranial hemorrhage. Chronic parietal 0 and occipital lobe encephalomalacia more pronounced on the right. Small chronic left cerebellar infarcts. No cortically based acute infarct identified. Vascular: Calcified atherosclerosis at the skull base. No suspicious intracranial vascular hyperdensity. Skull: New bilateral vertex burr holes. Sinuses/Orbits: Visualized paranasal sinuses and mastoids are stable and well aerated. Other: Postoperative changes now to the bilateral scalp vertex with percutaneous drains and skin staples in place. Similar leftward gaze. Postoperative changes to the right globe. IMPRESSION: 1. Postoperative substantial decrease in bilateral subdural hematomas with bilateral subdural drains in place. Residual blood up to 11 mm on the Left, 4-5 mm on the Right. 2. Significantly regressed intracranial mass effect. Residual rightward midline shift now 1-2 mm. 3. No new intracranial abnormality. Chronic ischemic disease. Electronically Signed   By: Odessa Fleming M.D.   On: 01/31/2024 05:16  Assessment and Plan   Alison Lopez is a 68 year old female with dementia, history of DVT on Eliquis, chronic thrombocytopenia, B12 deficiency, hypertension, who presents  emergency department for chief concerns of altered mental status.  Daughter last saw her on Thursday around 5 PM. Patient reports that on Thursday evening, patient went to bed and urinated on herself and defecated on himself. This is not normal for her.   CT head wo contrast: Was read as positive bilateral mixed density subdural hematomas, somewhat biconvex, 17 mm maximum thickness on the left and 14 mm maximum thickness on the right.   Bilateral subdural hematomas [facility]) --Neurosurgery has been consulted by EDP, Dr. Marcell Barlow is aware --Hold Eliquis -- patient is status post bilateral frontal burr hole for drainage of subdural hematoma with subdural drains placed on each side -- CT scan of the head to be done tomorrow --3/23-- CT head this morning looks stable postop subdural hematoma evacuation. Perm neurosurgery okay to start PT OT and speech. Okay for DVT prophylaxis.   chronic thrombocytopenia (HCC) --Goal platelet > 75 prior to neurosurgery in the AM --Discussed with hematologist, Dr. Cathie Hoops --Decadron 40 mg IV, daily, 4 days ordered on admission --plt 52K--1 unit --78K-- CBC and a.m.  Type 2 diabetes mellitus without complication (HCC) --Insulin SSI with at bedtime coverage ordered --Goal inpatient blood glucose levels 140-180  Antiphospholipid antibody syndrome -- previously failed Coumadin -- recently started seeing hematology and repeat APS labs consistent with the diagnosis. 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  >50% time spent on counselling and coordination of care  Note: This dictation was prepared with Dragon dictation along with smaller phrase technology. Any transcriptional errors that result from this process are unintentional.  Enedina Finner M.D    [facility]   CC: Primary care physician; Rayetta Humphrey, MD

## 2024-01-31 NOTE — Evaluation (Signed)
 Physical Therapy Evaluation Patient Details Name: Alison Lopez MRN: [id number] DOB: 01-19-1956 Today's Date: 01/31/2024  History of Present Illness  68 y/o female here with AMS, found to have b/l subdural hematomas now s/p B burr holes for subdural hematoma.  PMH: dementia, history of DVT on Eliquis, chronic thrombocytopenia, B12 deficiency, hypertension  Clinical Impression  Pt with flat affect with baseline confusion but did clearly show effort with requested tasks as she was able.  Pt unable to meaningfully coordinate any R U/LE AROM, limited reflexive against gravity engagement, etc. She showed great effort with getting to sitting and then stood at EOB for a few minutes (hand over hand assist to keep R hand on walker) - unable to coordinate any movement with the R LE to step (side, forward or otherwise) but no buckling. Baseline dementia on top of acute condition made some processing, following cues, etc difficult; but generally did better than expected with mobility and activity tolerance given profound R (dominant) neglect/weakness. Pt will require continued PT to address significant limitations and work back to functional status.      If plan is discharge home, recommend the following: Two people to help with walking and/or transfers;Two people to help with bathing/dressing/bathroom;Assistance with cooking/housework;Assistance with feeding;Assist for transportation;Help with stairs or ramp for entrance;Supervision due to cognitive status;Direct supervision/assist for medications management;Direct supervision/assist for financial management   Can travel by private vehicle   No    Equipment Recommendations  (TBD at rehab)  Recommendations for Other Services       Functional Status Assessment Patient has had a recent decline in their functional status and demonstrates the ability to make significant improvements in function in a reasonable and predictable amount of time.      Precautions / Restrictions Precautions Precautions: Fall Restrictions Weight Bearing Restrictions Per Provider Order: No      Mobility  Bed Mobility Overal bed mobility: Needs Assistance Bed Mobility: Supine to Sit, Sit to Supine     Supine to sit: Min assist Sit to supine: Mod assist   General bed mobility comments: Pt intially only able to move L side in attempt to get toward EOB, repeated cuing resulted in a cleaner attempt and she was able to reach L side to rail and partially pull herself up toward EOB. R UE caught behind her and unable to square herself up well, but she did do bulk of the transition to sitting.  More assist needed to get LEs back up into bed and back to supine    Transfers Overall transfer level: Needs assistance Equipment used: Rolling walker (2 wheels) Transfers: Sit to/from Stand Sit to Stand: Min assist, From elevated surface           General transfer comment: PT help place and maintain R hand on walker and blocking R foot/knee from translating forward during standing effort.  She initially hesitated but with tactile/verbal cuing to shift weight forward and get up into the walker she did manage to rise without excessive assist.  Maintained standing for 4-5 minutes with inability to do any AROM with R (minimal L steppage)    Ambulation/Gait               General Gait Details: unable/unsafe.  We did try marching, stepping, heel toe shuffling while in walker but pt unable to coordinate any active R side movement.  She did manage to do some L marching and stepping w/o R knee buckling.  Stairs  Wheelchair Mobility     Tilt Bed    Modified Rankin (Stroke Patients Only)       Balance Overall balance assessment: Needs assistance Sitting-balance support: Bilateral upper extremity supported Sitting balance-Leahy Scale: Fair     Standing balance support: Bilateral upper extremity supported Standing balance-Leahy Scale:  Poor Standing balance comment: no LOBs or buckling, but poor awareness and inabiltiy to move R LE                             Pertinent Vitals/Pain Pain Assessment Pain Assessment:  (unable to answer, but does not indicate signficant pain with most acts)    Home Living Family/patient expects to be discharged to:: Skilled nursing facility Living Arrangements: Children Available Help at Discharge: Family;Available PRN/intermittently (daughter - works morning shift, home ~11)   Home Access: Stairs to enter Entrance Stairs-Rails: Right Entrance Stairs-Number of Steps: 4     Home Equipment: Rollator (4 wheels)      Prior Function Prior Level of Function : Needs assist  Cognitive Assist :  (baseline dementia)           Mobility Comments: Pt apparently able to get up and go room to room often w/o AD, rarely out of the home ADLs Comments: daughter does all errands, cooking, cleaning, etc, will take pt with her occaionally.  Daughter gives bath, has to help with BM clean up sometimes     Extremity/Trunk Assessment   Upper Extremity Assessment Upper Extremity Assessment: Right hand dominant;RUE deficits/detail (L UE grossly WFL) RUE Deficits / Details: profoundly limited R side AROM.  Pt repeatedly moving L side with explicit/repeated cuing for R side movement.  Did show attempt to lift R UE with L hand after repeated cues but unable to initiate AROM with shld, elbow, wrist or hand when cued.  Did show occasional trace R side engagement with against gravity AAROM movements    Lower Extremity Assessment Lower Extremity Assessment: RLE deficits/detail RLE Deficits / Details: again little to no AROM or ability to get more than seemingly reflexive responses with trying to activate R LE       Communication   Communication Communication: Other (comment) Factors Affecting Communication:  (baseline dementia with new onset AMS)    Cognition Arousal: Alert Behavior During  Therapy: Flat affect   PT - Cognitive impairments: History of cognitive impairments, Difficult to assess Difficult to assess due to: Impaired communication                     PT - Cognition Comments: Pt largely non-verbal, certainly able to follow some cuing but  profound R side neglect despite explicit multi-modal cuing Following commands: Impaired Following commands impaired: Follows one step commands inconsistently     Cueing Cueing Techniques: Verbal cues, Gestural cues, Tactile cues, Visual cues     General Comments General comments (skin integrity, edema, etc.): Pt was unable to do any real controlled R side movement but did manage to get up to sitting w/o excessive assist and stand with walker and close MinA at EOB for a few minutes    Exercises     Assessment/Plan    PT Assessment Patient needs continued PT services  PT Problem List Decreased strength;Decreased range of motion;Decreased activity tolerance;Decreased balance;Decreased mobility;Decreased coordination;Decreased cognition;Decreased knowledge of use of DME;Decreased safety awareness;Decreased knowledge of precautions;Impaired sensation;Cardiopulmonary status limiting activity       PT Treatment Interventions Gait training;DME instruction;Functional  mobility training;Therapeutic activities;Therapeutic exercise;Balance training;Patient/family education;Neuromuscular re-education    PT Goals (Current goals can be found in the Care Plan section)  Acute Rehab PT Goals Patient Stated Goal: daughter wishes to be able to take her home PT Goal Formulation: With patient Time For Goal Achievement: 02/13/24 Potential to Achieve Goals: Fair    Frequency Min 2X/week     Co-evaluation               AM-PAC PT "6 Clicks" Mobility  Outcome Measure Help needed turning from your back to your side while in a flat bed without using bedrails?: A Little Help needed moving from lying on your back to sitting on the  side of a flat bed without using bedrails?: A Lot Help needed moving to and from a bed to a chair (including a wheelchair)?: A Lot Help needed standing up from a chair using your arms (e.g., wheelchair or bedside chair)?: A Lot Help needed to walk in hospital room?: Total Help needed climbing 3-5 steps with a railing? : Total 6 Click Score: 11    End of Session Equipment Utilized During Treatment: Gait belt Activity Tolerance: Patient tolerated treatment well Patient left: with bed alarm set;with call bell/phone within reach;with family/visitor present Nurse Communication: Mobility status PT Visit Diagnosis: Muscle weakness (generalized) (M62.81);Difficulty in walking, not elsewhere classified (R26.2);Unsteadiness on feet (R26.81);Other symptoms and signs involving the nervous system (R29.898);Hemiplegia and hemiparesis Hemiplegia - Right/Left: Right Hemiplegia - dominant/non-dominant: Dominant Hemiplegia - caused by: Other Nontraumatic intracranial hemorrhage    Time: 1610-9604 PT Time Calculation (min) (ACUTE ONLY): 31 min   Charges:   PT Evaluation $PT Eval Moderate Complexity: 1 Mod PT Treatments $Therapeutic Activity: 8-22 mins PT General Charges $$ ACUTE PT VISIT: 1 Visit         Malachi Pro, DPT 01/31/2024, 4:40 PM

## 2024-01-31 NOTE — Progress Notes (Signed)
 Notified Dr. Myer Haff that patient is not moving right upper or lower extremities except to pain. MD acknowledged and stated her CT scan looks great. No new orders given at this time.

## 2024-01-31 NOTE — Progress Notes (Signed)
 Dr. Myer Haff gave order for [facility] neuro checks for 4 hours then neuro checks q4H.

## 2024-01-31 NOTE — Progress Notes (Addendum)
 Neurosurgery Progress Note  History: Alison Lopez is here for bilateral subdural hematoma  POD1: Alison Lopez.  Intermittently not cooperating with moving RUE and RLE. No headaches today  Physical Exam: Vitals:   01/31/24 0900 01/31/24 1000  BP: 123/63 (!) 114/54  Pulse: 60 69  Resp: 14 16  Temp:    SpO2: 99% 99%    AA Ox3 CNI with exception of R pupil - abnormal secondary to surgery, nonreactive. She does look to R across midline but takes significant prompting to get her to do that.  Strength:5/5 throughout LUE and LLE RUE - 3/5 with brisk withdrawal RLE - at least 3/5 with brisk withdrawal  Data:  Other tests/results: CT reviewed - subdural hematomas much smaller.  Drains removed - total output approximately 300 including what was drained in OR  Assessment/Plan:  Alison Lopez is stable after B burr holes for subdural hematoma  - mobilize - pain control - DVT prophylaxis ok to start - PTOT - OK to elevate HOB - q2 hr neuro checks x2, then switch to q4 hr neuro checks   Venetia Night MD, [facility] Department of Neurosurgery

## 2024-01-31 NOTE — Plan of Care (Signed)
  Problem: Education: Goal: Ability to describe self-care measures that may prevent or decrease complications (Diabetes Survival Skills Education) will improve Outcome: Progressing   Problem: Coping: Goal: Ability to adjust to condition or change in health will improve Outcome: Progressing   Problem: Fluid Volume: Goal: Ability to maintain a balanced intake and output will improve Outcome: Progressing   Problem: Health Behavior/Discharge Planning: Goal: Ability to identify and utilize available resources and services will improve Outcome: Progressing Goal: Ability to manage health-related needs will improve Outcome: Progressing   Problem: Metabolic: Goal: Ability to maintain appropriate glucose levels will improve Outcome: Progressing   Problem: Nutritional: Goal: Maintenance of adequate nutrition will improve Outcome: Progressing Goal: Progress toward achieving an optimal weight will improve Outcome: Progressing   Problem: Skin Integrity: Goal: Risk for impaired skin integrity will decrease Outcome: Progressing   Problem: Tissue Perfusion: Goal: Adequacy of tissue perfusion will improve Outcome: Progressing   Problem: Health Behavior/Discharge Planning: Goal: Ability to manage health-related needs will improve Outcome: Progressing   Problem: Clinical Measurements: Goal: Ability to maintain clinical measurements within normal limits will improve Outcome: Progressing Goal: Will remain free from infection Outcome: Progressing Goal: Diagnostic test results will improve Outcome: Progressing Goal: Respiratory complications will improve Outcome: Progressing Goal: Cardiovascular complication will be avoided Outcome: Progressing   Problem: Activity: Goal: Risk for activity intolerance will decrease Outcome: Progressing   Problem: Nutrition: Goal: Adequate nutrition will be maintained Outcome: Progressing   Problem: Coping: Goal: Level of anxiety will  decrease Outcome: Progressing   Problem: Elimination: Goal: Will not experience complications related to bowel motility Outcome: Progressing   Problem: Safety: Goal: Ability to remain free from injury will improve Outcome: Progressing   Problem: Skin Integrity: Goal: Risk for impaired skin integrity will decrease Outcome: Progressing

## 2024-01-31 NOTE — Progress Notes (Signed)
 SLP Cancellation Note  Patient Details Name: Alison Lopez MRN: [id number] DOB: 09-26-1956   Cancelled treatment:       Reason Eval/Treat Not Completed: Patient not medically ready (chart reviewed)  Per chart review and NSG, pt was seen by Neurosurgery yesterday for surgery at ~10:30am on 01/30/24. Pt admitted w/ subdural hematoma causing significant brain compression and symptoms, prompting surgical intervention. Per Neurology note on 01/02/24, "bilateral bur hole for subdural hematoma evacuation. Due to her recent dosing of Eliquis and her current stable but altered neurologic condition, I have recommended deferring drainage until tomorrow when she will be 48 hours since her last dose of Eliquis.".   Recommend holding on ST evaluation for Cognitive-linguistic needs today as pt is still w/in 24 hours of surgery/recovery. ST services will f/u tomorrow w/ assessment. NSG agreed.      Jerilynn Som, MS, CCC-SLP Speech Language Pathologist Rehab Services; [facility] [phone] (ascom) Avantika Shere 01/31/2024, 10:52 AM

## 2024-02-01 ENCOUNTER — Encounter: Payer: Self-pay | Admitting: Neurosurgery

## 2024-02-01 DIAGNOSIS — S065XAA Traumatic subdural hemorrhage with loss of consciousness status unknown, initial encounter: Secondary | ICD-10-CM | POA: Diagnosis not present

## 2024-02-01 DIAGNOSIS — F0390 Unspecified dementia without behavioral disturbance: Secondary | ICD-10-CM | POA: Diagnosis not present

## 2024-02-01 DIAGNOSIS — D6861 Antiphospholipid syndrome: Secondary | ICD-10-CM | POA: Diagnosis not present

## 2024-02-01 DIAGNOSIS — Z7901 Long term (current) use of anticoagulants: Secondary | ICD-10-CM | POA: Diagnosis not present

## 2024-02-01 LAB — GLUCOSE, CAPILLARY
Glucose-Capillary: 247 mg/dL — ABNORMAL HIGH (ref 70–99)
Glucose-Capillary: 125 mg/dL — ABNORMAL HIGH (ref 70–99)
Glucose-Capillary: 85 mg/dL (ref 70–99)
Glucose-Capillary: 178 mg/dL — ABNORMAL HIGH (ref 70–99)
Glucose-Capillary: 127 mg/dL — ABNORMAL HIGH (ref 70–99)
Glucose-Capillary: 168 mg/dL — ABNORMAL HIGH (ref 70–99)

## 2024-02-01 LAB — CBC
HCT: 32.7 % — ABNORMAL LOW (ref 36.0–46.0)
Hemoglobin: 11.4 g/dL — ABNORMAL LOW (ref 12.0–15.0)
MCH: 31.1 pg (ref 26.0–34.0)
MCHC: 34.9 g/dL (ref 30.0–36.0)
MCV: 89.1 fL (ref 80.0–100.0)
Platelets: 64 10*3/uL — ABNORMAL LOW (ref 150–400)
RBC: 3.67 MIL/uL — ABNORMAL LOW (ref 3.87–5.11)
RDW: 12.5 % (ref 11.5–15.5)
WBC: 10.4 10*3/uL (ref 4.0–10.5)
nRBC: 0 % (ref 0.0–0.2)

## 2024-02-01 NOTE — Plan of Care (Signed)
  Problem: Nutritional: Goal: Maintenance of adequate nutrition will improve Outcome: Progressing   Problem: Skin Integrity: Goal: Risk for impaired skin integrity will decrease Outcome: Progressing   Problem: Clinical Measurements: Goal: Ability to maintain clinical measurements within normal limits will improve Outcome: Progressing Goal: Will remain free from infection Outcome: Progressing   Problem: Coping: Goal: Ability to adjust to condition or change in health will improve Outcome: Not Progressing

## 2024-02-01 NOTE — Plan of Care (Signed)
  Problem: Education: Goal: Ability to describe self-care measures that may prevent or decrease complications (Diabetes Survival Skills Education) will improve Outcome: Progressing   Problem: Metabolic: Goal: Ability to maintain appropriate glucose levels will improve Outcome: Progressing   Problem: Nutritional: Goal: Maintenance of adequate nutrition will improve Outcome: Progressing   Problem: Skin Integrity: Goal: Risk for impaired skin integrity will decrease Outcome: Progressing   Problem: Safety: Goal: Ability to remain free from injury will improve Outcome: Progressing

## 2024-02-01 NOTE — Progress Notes (Signed)
 Physical Therapy Treatment Patient Details Name: Alison Lopez MRN: [id number] DOB: 09-29-1956 Today's Date: 02/01/2024   History of Present Illness 68 y/o female here with AMS, found to have b/l subdural hematomas now s/p B burr holes for subdural hematoma.  PMH: dementia, history of DVT on Eliquis, chronic thrombocytopenia, B12 deficiency, hypertension    PT Comments  Patient is cooperative throughout session. She is interactive and talking. The patient continues to have right side neglect/inattention. She needs extensive cues for right side awareness and can look past midline to the right with multi modal cueing. The patient was able to stand today with +2 person assistance. Standing balance initially with +2 person assistance required, progressing to one person. Pre-gait activity performed including facilitation for weight shifting for advancement of RLE for side steps to the right.  Recommend to continue PT to maximize independence. Anticipate patient could benefit from rehabilitation > 3 hours/day.    If plan is discharge home, recommend the following: Assistance with cooking/housework;Assistance with feeding;Assist for transportation;Help with stairs or ramp for entrance;Supervision due to cognitive status;Direct supervision/assist for medications management;Direct supervision/assist for financial management;A lot of help with bathing/dressing/bathroom;A lot of help with walking and/or transfers   Can travel by private vehicle     No  Equipment Recommendations  Wheelchair (measurements PT)    Recommendations for Other Services Rehab consult     Precautions / Restrictions Precautions Precautions: Fall Recall of Precautions/Restrictions: Impaired Restrictions Weight Bearing Restrictions Per Provider Order: No     Mobility  Bed Mobility Overal bed mobility: Needs Assistance Bed Mobility: Supine to Sit, Sit to Supine, Rolling Rolling: Min assist   Supine to sit: Min assist,  +2 for physical assistance Sit to supine: Min assist, Used rails   General bed mobility comments: verbal cues for task initiation. patient needs assistance for right hemibody and cues for attention to right side. increased active movement of right leg with rolling    Transfers Overall transfer level: Needs assistance Equipment used: 2 person hand held assist Transfers: Sit to/from Stand Sit to Stand: Min assist, +2 physical assistance           General transfer comment: verbal cues for task initiation. cues for anterior weight shifting. facilitation for hip extension on the right    Ambulation/Gait             Pre-gait activities: with physical assistance, patient is able to take 3 side steps to the right with physical assistance for advancement of RLE. weight shifting faciliation to the left  movement of RLE with active hip movement noted. patient relying on the bed for posterior leg support     Stairs             Wheelchair Mobility     Tilt Bed    Modified Rankin (Stroke Patients Only)       Balance Overall balance assessment: Needs assistance Sitting-balance support: Feet supported Sitting balance-Leahy Scale: Fair     Standing balance support: Bilateral upper extremity supported Standing balance-Leahy Scale: Poor Standing balance comment: external support required to maintain standing balance. intermittently Min A +2 person progressing to one person assistance                            Communication Communication Communication: Impaired Factors Affecting Communication: Difficulty expressing self  Cognition Arousal: Alert Behavior During Therapy: WFL for tasks assessed/performed   PT - Cognitive impairments: History of cognitive impairments, Difficult to  assess Difficult to assess due to: Impaired communication                     PT - Cognition Comments: right side neglect during session. patient is able to follow single  step commands with increased time and occasional repetition. she is oriented to self only. cooperative throughout session and interactive today Following commands: Impaired Following commands impaired: Follows one step commands with increased time, Follows one step commands inconsistently    Cueing Cueing Techniques: Verbal cues, Gestural cues, Tactile cues, Visual cues  Exercises      General Comments General comments (skin integrity, edema, etc.): extensive faciliation for increased awareness of right side with cues for looking to the right. faciliation for movement of RLE and RUE with increased movement of RLE after mobilizing. patient is able to look past midline to the right with extensive cueing and would likey benefit from trial of mirror for feedback      Pertinent Vitals/Pain Pain Assessment Pain Assessment: No/denies pain    Home Living     Available Help at Discharge: Family;Available PRN/intermittently Type of Home: House                  Prior Function            PT Goals (current goals can now be found in the care plan section) Acute Rehab PT Goals Patient Stated Goal: none stated today PT Goal Formulation: With patient Time For Goal Achievement: 02/13/24 Potential to Achieve Goals: Fair Progress towards PT goals: Progressing toward goals    Frequency    Min 2X/week      PT Plan      Co-evaluation PT/OT/SLP Co-Evaluation/Treatment: Yes Reason for Co-Treatment: Necessary to address cognition/behavior during functional activity;To address functional/ADL transfers PT goals addressed during session: Mobility/safety with mobility        AM-PAC PT "6 Clicks" Mobility   Outcome Measure  Help needed turning from your back to your side while in a flat bed without using bedrails?: A Little Help needed moving from lying on your back to sitting on the side of a flat bed without using bedrails?: A Lot Help needed moving to and from a bed to a chair  (including a wheelchair)?: A Lot Help needed standing up from a chair using your arms (e.g., wheelchair or bedside chair)?: A Lot Help needed to walk in hospital room?: Total Help needed climbing 3-5 steps with a railing? : Total 6 Click Score: 11    End of Session Equipment Utilized During Treatment: Gait belt Activity Tolerance: Patient tolerated treatment well Patient left: in bed;with call bell/phone within reach;with bed alarm set Nurse Communication: Mobility status PT Visit Diagnosis: Muscle weakness (generalized) (M62.81);Difficulty in walking, not elsewhere classified (R26.2);Unsteadiness on feet (R26.81);Other symptoms and signs involving the nervous system (R29.898);Hemiplegia and hemiparesis Hemiplegia - Right/Left: Right Hemiplegia - dominant/non-dominant: Dominant Hemiplegia - caused by: Other Nontraumatic intracranial hemorrhage     Time: 0937-1000 PT Time Calculation (min) (ACUTE ONLY): 23 min  Charges:    $Therapeutic Activity: 8-22 mins PT General Charges $$ ACUTE PT VISIT: 1 Visit                    Donna Bernard, PT, MPT    Ina Homes 02/01/2024, 11:00 AM

## 2024-02-01 NOTE — Progress Notes (Signed)
 Pt to be transferred to 1C-125, Report given to [facility], VSS at this time. Pt escorted by this RN. Daughter at bedside.

## 2024-02-01 NOTE — Evaluation (Signed)
 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   COPD (chronic obstructive pulmonary disease) (HCC) 06/28/2019   Depression 06/28/2019   Fibromyalgia 06/28/2019   GERD (gastroesophageal reflux disease) 06/28/2019   Hyperlipidemia 06/28/2019   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 06/28/2019   Rheumatoid arthritis (HCC) 06/28/2019   Diabetes (HCC) 05/17/2019   Essential hypertension 05/17/2019   DVT (deep venous thrombosis) (HCC) 04/21/2019   Right leg pain 03/21/2019   Compression fracture of L2 (HCC) 01/28/2017   Osteoporosis of multiple sites 01/28/2017   Pain in joint, pelvic region and thigh 01/28/2017   Abnormal intentional weight loss 12/16/2016   DDD (degenerative disc disease), lumbar 12/16/2016   Generalized osteoarthritis of hand 12/16/2016   Anticoagulant long-term use 01/31/2015   Episode of dizziness 01/25/2015   History of DVT of lower extremity 01/25/2015   Type 2 diabetes mellitus without complication (HCC) 01/25/2015   Past Medical History:  Past Medical History:  Diagnosis Date   Anxiety    Arthritis    Collagen vascular disease (HCC)    COPD (chronic obstructive pulmonary disease) (HCC)    DDD (degenerative disc disease), lumbar    Dementia (HCC)    Depression    Diabetes mellitus without complication (HCC)    DVT (deep venous thrombosis) (HCC)    Fibromyalgia    GERD (gastroesophageal reflux disease)    Graves' disease with  exophthalmos    HLD (hyperlipidemia)    Hypertension    Hypothyroidism    Lupus    Osteoporosis    Past Surgical History:  Past Surgical History:  Procedure Laterality Date   ABDOMINAL HYSTERECTOMY     COLONOSCOPY WITH PROPOFOL N/A 06/11/2021   Procedure: COLONOSCOPY WITH PROPOFOL;  Surgeon: Regis Bill, MD;  Location: [facility] ENDOSCOPY;  Service: Endoscopy;  Laterality: N/A;  prefers afternoon Eliquis   COLONOSCOPY WITH PROPOFOL N/A 12/19/2022   Procedure: COLONOSCOPY WITH PROPOFOL;  Surgeon: Regis Bill, MD;  Location: [facility] ENDOSCOPY;  Service: Endoscopy;  Laterality: N/A;   ESOPHAGOGASTRODUODENOSCOPY N/A 06/11/2021   Procedure: ESOPHAGOGASTRODUODENOSCOPY (EGD);  Surgeon: Regis Bill, MD;  Location: [facility] ENDOSCOPY;  Service: Endoscopy;  Laterality: N/A;   EYE SURGERY Right    IVC FILTER REMOVAL N/A 07/21/2019   Procedure: IVC FILTER REMOVAL;  Surgeon: Annice Needy, MD;  Location: [facility] INVASIVE CV LAB;  Service: Cardiovascular;  Laterality: N/A;   LOWER EXTREMITY INTERVENTION Right 04/22/2019   Procedure: IVC Filter Insertion with Right Lower Extremity Venous Lysis;  Surgeon: Annice Needy, MD;  Location: [facility] INVASIVE CV LAB;  Service: Cardiovascular;  Laterality: Right;   OOPHORECTOMY     HPI:  Per MD Progress note: "Alison Lopez is a 68 year old female with dementia, history of DVT on Eliquis, chronic thrombocytopenia, B12 deficiency, hypertension, who presents emergency department for chief concerns of altered mental status. Daughter last saw her on Thursday around 5 PM. Patient reports that on Thursday evening, patient went to bed and urinated on herself and defecated on himself. This is not normal for her." Pt is now s/p  bilateral frontal burr hole for drainage of subdural hematoma with subdural drains placed on each side. Head CT 3/23: Postoperative substantial decrease in bilateral subdural  hematomas with bilateral subdural drains in place. Residual blood up  to  11 mm on the Left, 4-5 mm on the Right.  2. Significantly regressed intracranial mass effect. Residual  rightward midline shift now 1-2 mm.  3. No new intracranial abnormality. Chronic ischemic disease.   Assessment / Plan / Recommendation Clinical Impression  Pt seen for cognitive linguistic evaluation s/p bilateral frontal burr hole for drainage of subdural hematoma. Assessment consisting of pt/friend (Ada) interview and completion of [employer] mental Status (SLUMS). Score of 8/30 obtained on SLUMS falling outside the criterion of normal limits. Pt presents with grossly moderate cognitive communication deficit, c/b severe deficits in short term, working, and immediate recall and moderate deficits for orientation, safety, and problem solving. Further, pt with slowed processing speed and delayed responses. Of note pt with baseline dementia, with deficits with thought organization and memory PTA per pt/friend. Today's assessment impacted by baseline vision deficits, glasses were not present in the room. Verbal cues (binary options and semantic cues) and repetition bolstered cognitive processing. Independent expressive/receptive language demonstrated during assessment. Motor speech notable imprecise articulation, though inconsistent in nature during conversational speech and with repetition- no overt oral motor weakness- ? Impact of verbal apraxia. Pt with emerging awareness for current deficits. Friend/pt endorsed challenge with comparison to pt's baseline cognition. Therefore, recommend continued assessment to determine approximation to baseline and follow up recommendations. Pt likely to benefit from supervision for higher level cognitive tasks and continued SLP services at discharge.  Recommend provision of extended time for response, provision of options for complex questions, external aids for memory and orientation (when glasses are present).   Orientation  2/3  Functional Calculation  1/3   Generative Naming 1/3  Lexical Recall 0/5  Working Memory  0/2  Executive Function 0/4- impacted by baseline vision  Visual Processing 0/2- impacted by baseline vision  Auditory Comprehension  4/8- impacted by memory   Total 8/30- falling outside the criterion of functional limits (greater than or equal to 27)      SLP Assessment  SLP Recommendation/Assessment: Patient needs continued Speech Lanaguage Pathology Services SLP Visit Diagnosis: Apraxia (R48.2);Cognitive communication deficit (R41.841)    Recommendations for follow up therapy are one component of a multi-disciplinary discharge planning process, led by the attending physician.  Recommendations may be updated based on patient status, additional functional criteria and insurance authorization.    Follow Up Recommendations  Follow physician's recommendations for discharge plan and follow up therapies    Assistance Recommended at Discharge  Frequent or constant Supervision/Assistance (for higher level cognitive tasks)  Functional Status Assessment Patient has had a recent decline in their functional status and demonstrates the ability to make significant improvements in function in a reasonable and predictable amount of time.  Frequency and Duration min 2x/week  2 weeks      SLP Evaluation Cognition  Overall Cognitive Status: Impaired/Different from baseline Arousal/Alertness: Awake/alert Orientation Level: Oriented to person;Oriented to place;Disoriented to time;Disoriented to situation Year:  (required binary options) Month:  (required cues) Day of Week: Correct Attention: Sustained Sustained Attention: Impaired Sustained Attention Impairment: Verbal basic Memory: Impaired Memory Impairment: Decreased recall of new information;Storage deficit Awareness: Impaired Awareness Impairment: Intellectual impairment Problem Solving: Impaired Problem Solving Impairment: Verbal basic Executive Function:  Organizing Organizing: Impaired Organizing Impairment: Verbal basic Behaviors:  (none) Safety/Judgment:  (emerging- reports  concern for fall if she attempted to stand)       Comprehension  Auditory Comprehension Overall Auditory Comprehension: Appears within functional limits for tasks assessed Visual Recognition/Discrimination Discrimination: Not tested (baseline visual deficits- glasses not present) Reading Comprehension Reading Status: Not tested    Expression Expression Primary Mode of Expression: Verbal Verbal Expression Overall Verbal Expression: Appears within functional limits for tasks assessed Written Expression Written Expression: Not tested   Oral / Motor  Oral Motor/Sensory Function Overall Oral Motor/Sensory Function: Within functional limits Motor Speech Overall Motor Speech: Impaired Respiration: Within functional limits Phonation: Normal Resonance: Within functional limits Articulation: Impaired Level of Impairment: Word Intelligibility: Intelligibility reduced Word: 50-74% accurate (with repetition) Motor Planning: Impaired Level of Impairment: Word Motor Speech Errors: Inconsistent;Unaware Interfering Components:  (none) Effective Techniques: Slow rate           [place] L[place] Clapp, MS, CCC-SLP Speech Language Pathologist Rehab Services; [facility] [phone] (ascom)   [place] J Clapp 02/01/2024, 9:48 AM

## 2024-02-01 NOTE — Progress Notes (Signed)
 Pt rested well mostly throughout the night, HR remains in the 60s when awake and dropped at times into the 30s non-sustained. MD made aware and pt was awaken each time and assessed for symptoms. Pt stated no headaches or pain, just cold. Still not able to move right side limbs on command and still has delayed/slurred verbal responses. BP stable at 129/78, resp rate 16, and O2 100%. A &O to self and place.

## 2024-02-01 NOTE — Evaluation (Addendum)
 Occupational Therapy Evaluation Patient Details Name: Alison Lopez MRN: [id number] DOB: 01/19/1956 Today's Date: 02/01/2024   History of Present Illness   68 y/o female here with AMS, found to have b/l subdural hematomas now s/p B burr holes for subdural hematoma.  PMH: dementia, history of DVT on Eliquis, chronic thrombocytopenia, B12 deficiency, hypertension     Clinical Impressions Chart reviewed, greeted semi supine in bed, oriented to self, and grossly to situation (but oriented to place after cueing). Flat affect throughout. Pt follows one step directions with multi modal cues. PTA per chart pt amb with with rollator household distances, has assist for bathing, dressing, IADLs.  Pt presents with R sided neglect requiring multi modal cues to attend to R side. Trace activation noted throughout RUE, unable to perform AROM on this date with sensation/formal vision testing limited due to current cognitive status. MIN A +1-2 required for bed mbilty, STS with MIN A +2 with no buckling noted. Pt takes three steps up the bed with MIN A +2 with facilitation throughout RLE. Static standing with MIN A +1-2 for approx 5 minutes. MAX A for dressing, grooming tasks. Pt will benefit from acute OT to address functional deficits and to facilitate optimal ADL performance. OT will follow acutely.      If plan is discharge home, recommend the following:   A lot of help with bathing/dressing/bathroom;A lot of help with walking and/or transfers;Supervision due to cognitive status;Assist for transportation;Direct supervision/assist for medications management;Assistance with cooking/housework;Help with stairs or ramp for entrance;Direct supervision/assist for financial management;Assistance with feeding     Functional Status Assessment   Patient has had a recent decline in their functional status and demonstrates the ability to make significant improvements in function in a reasonable and predictable amount  of time.     Equipment Recommendations   Other (comment) (defer to next venue of care)     Recommendations for Other Services         Precautions/Restrictions   Precautions Precautions: Fall Recall of Precautions/Restrictions: Impaired     Mobility Bed Mobility Overal bed mobility: Needs Assistance Bed Mobility: Supine to Sit, Sit to Supine, Rolling Rolling: Min assist (R and L with intermittent cueing for initiation)   Supine to sit: Min assist, +2 for physical assistance Sit to supine: Min assist, Used rails   General bed mobility comments: step by step multi modal cues for task initiation    Transfers Overall transfer level: Needs assistance Equipment used: 2 person hand held assist Transfers: Sit to/from Stand Sit to Stand: Min assist, +2 physical assistance           General transfer comment: mutiple attempts, step by step multi modal cues for initiaton and weigth shifting. No buckling noted while standing      Balance Overall balance assessment: Needs assistance Sitting-balance support: Feet supported Sitting balance-Leahy Scale: Fair   Postural control: Right lateral lean (with fatigue) Standing balance support: Bilateral upper extremity supported Standing balance-Leahy Scale: Poor Standing balance comment: MIN A +2, progress to +1                           ADL either performed or assessed with clinical judgement   ADL Overall ADL's : Needs assistance/impaired                 Upper Body Dressing : Maximal assistance Upper Body Dressing Details (indicate cue type and reason): anticipate Lower Body Dressing: Maximal assistance Lower  Body Dressing Details (indicate cue type and reason): donn ted hose/socks, raised LLE to assist     Toileting- Clothing Manipulation and Hygiene: Maximal assistance;Bed level               Vision   Vision Assessment?: Yes Eye Alignment: Within Functional Limits Alignment/Gaze  Preference: Gaze left Additional Comments: cognition impairs formal vision assessment, pt does turn head/look to R with max multi modal cues; will continue to assess     Perception Perception: Impaired Preception Impairment Details: Inattention/Neglect     Praxis Praxis: Impaired Praxis Impairment Details: Motor planning     Pertinent Vitals/Pain Pain Assessment Pain Assessment: CPOT Facial Expression: Relaxed, neutral Body Movements: Absence of movements Muscle Tension: Relaxed Compliance with ventilator (intubated pts.): N/A Vocalization (extubated pts.): Talking in normal tone or no sound CPOT Total: 0 Pain Intervention(s): Monitored during session     Extremity/Trunk Assessment Upper Extremity Assessment Upper Extremity Assessment: RUE deficits/detail (LUE appears WFL) RUE Deficits / Details: trace activiation throughout RUE with multi modal cueing, PROM WFL; pt imparied cognition affects sensation assessment; will continue to assess RUE Coordination: decreased fine motor;decreased gross motor   Lower Extremity Assessment Lower Extremity Assessment: Defer to PT evaluation       Communication Communication Communication: Impaired Factors Affecting Communication: Difficulty expressing self   Cognition Arousal: Alert Behavior During Therapy: Flat affect Cognition: Cognition impaired, History of cognitive impairments   Orientation impairments: Place, Time (initially reports she is home, but then reprots hospital after cueing; reports she had brain surgery) Awareness: Intellectual awareness impaired, Online awareness impaired Memory impairment (select all impairments): Short-term memory, Working [employer] fast streamer, Copywriter, advertising, Engineer, structural memory Attention impairment (select first level of impairment): Sustained attention Executive functioning impairment (select all impairments): Sequencing OT - Cognition Comments: pt has baseling cognitive deficits but  anticipate increased deficits due to acute issues                 Following commands: Impaired Following commands impaired: Follows one step commands with increased time, Follows one step commands inconsistently     Cueing  General Comments   Cueing Techniques: Verbal cues;Gestural cues;Tactile cues;Visual cues  vss throughout   Exercises Other Exercises Other Exercises: facilitation throughout RUE, improved attempts at mobility after Other Exercises: edu re: role of OT, role of rehab, discharge recommendations   Shoulder Instructions      Home Living Family/patient expects to be discharged to:: Private residence Living Arrangements: Children Available Help at Discharge: Family;Available PRN/intermittently Type of Home: House Home Access: Stairs to enter [employer] of Steps: 4 Entrance Stairs-Rails: Right                     Additional Comments: information from chart, will need to confirm  Lives With: Daughter    Prior Functioning/Environment Prior Level of Function : Needs assist             Mobility Comments: per chart, pt amb household distances with AD ADLs Comments: per chart, pt requires assist for bathing, dressing, all IADLs;    OT Problem List: Decreased strength;Decreased range of motion;Decreased activity tolerance;Decreased coordination;Impaired vision/perception;Impaired balance (sitting and/or standing);Decreased cognition;Impaired tone;Decreased safety awareness;Decreased knowledge of use of DME or AE;Impaired UE functional use   OT Treatment/Interventions: Self-care/ADL training;DME and/or AE instruction;Therapeutic activities;Balance training;Therapeutic exercise;Cognitive remediation/compensation;Manual therapy;Neuromuscular education;Modalities;Visual/perceptual remediation/compensation;Energy conservation;Splinting;Patient/family education      OT Goals(Current goals can be found in the care plan section)   Acute  Rehab OT Goals Patient  Stated Goal: stand OT Goal Formulation: With patient Time For Goal Achievement: 02/15/24 Potential to Achieve Goals: Good ADL Goals Pt Will Perform Grooming: with min assist;sitting Pt Will Perform Lower Body Dressing: with min assist Pt Will Transfer to Toilet: with contact guard assist;stand pivot transfer Pt Will Perform Toileting - Clothing Manipulation and hygiene: with min assist;sitting/lateral leans;sit to/from stand Pt/caregiver will Perform Home Exercise Program: Right Upper extremity;Increased ROM;With written HEP provided Additional ADL Goal #1: Pt will improve functional vision as evidenced by attending to items on R side 3/5 trials with no cueing required   OT Frequency:  Min 3X/week    Co-evaluation PT/OT/SLP Co-Evaluation/Treatment: Yes Reason for Co-Treatment: Necessary to address cognition/behavior during functional activity;To address functional/ADL transfers PT goals addressed during session: Mobility/safety with mobility OT goals addressed during session: ADL's and self-care      AM-PAC OT "6 Clicks" Daily Activity     Outcome Measure Help from another person eating meals?: A Lot Help from another person taking care of personal grooming?: A Lot Help from another person toileting, which includes using toliet, bedpan, or urinal?: A Lot Help from another person bathing (including washing, rinsing, drying)?: A Lot Help from another person to put on and taking off regular upper body clothing?: A Lot Help from another person to put on and taking off regular lower body clothing?: A Lot 6 Click Score: 12   End of Session Equipment Utilized During Treatment: Gait belt Nurse Communication: Mobility status  Activity Tolerance: Patient tolerated treatment well Patient left: in bed;with call bell/phone within reach;with bed alarm set (modified chair position)  OT Visit Diagnosis: Other abnormalities of gait and mobility (R26.89);Muscle weakness  (generalized) (M62.81);Hemiplegia and hemiparesis Hemiplegia - Right/Left: Right                Time: 1610-9604 OT Time Calculation (min): 30 min Charges:  OT General Charges $OT Visit: 1 Visit OT Evaluation $OT Eval High Complexity: 1 High  Oleta Mouse, OTD OTR/L  02/01/24, 12:15 PM

## 2024-02-01 NOTE — TOC Initial Note (Signed)
 Transition of Care [facility]) - Initial/Assessment Note    Patient Details  Name: Alison Lopez MRN: [id number] Date of Birth: August 26, 1956  [facility]) CM/SW Contact:    Garret Reddish, RN Phone Number: 02/01/2024, 11:06 AM  Clinical Narrative:                 Chart reviewed.   Noted taht patient was admitted with Bilateral Subdural hematoma.  Patient had bilateral frontal burr hole for drainage of subdural hematoma on 01-30-24.  I have spoken with patient 's daughter Karlynn Furrow.  She informs that prior to admission she was living at home with her mother.  She reports that Mrs. Bieda was able to get around the home with a rollator.  Amber assisted her with bathing and dressing.  Amber reported that patient does have shower chair and rollator at home.  Amber took her to medical appointments.  Amber informs me that medications are affordable and patient uses [facility] for prescription medications.    I have spoken with Amber about PT recommendations for Acute Inpatient Rehab.  I have also spoken with Amber about SNF as back up plan for rehab.  Amber would like for patient to go to in patient rehab.  Amber reports that patient has not been to a facility before and currently has no home health services in place.    TOC will continue to follow for discharge planning.       Expected Discharge Plan:  (Acute Inpatient rehab v/s SNF) Barriers to Discharge: No Barriers Identified   Patient Goals and CMS Choice   CMS Medicare.gov Compare Post Acute Care list provided to:: Patient Represenative (must comment) (Spoke with patient's daughter)   [facility] ownership interest in [facility].provided to:: Adult Children    Expected Discharge Plan and Services   Discharge Planning Services: CM Consult Post Acute Care Choice: IP Rehab, Skilled Nursing Facility Living arrangements for the past 2 months: Single Family Home                                       Prior Living Arrangements/Services Living arrangements for the past 2 months: Single Family Home Lives with:: Adult Children (daughter lives with Mrs. Kimble)            Care giver support system in place?: Yes (comment) (Patient has a supportive daughter at home.) Current home services:  (Patient has a rollator and a shower chair at home.)    Activities of Daily Living   ADL Screening (condition at time of admission) Independently performs ADLs?: No Does the patient have a NEW difficulty with bathing/dressing/toileting/self-feeding that is expected to last >3 days?: Yes (Initiates electronic notice to provider for possible OT consult) Does the patient have a NEW difficulty with getting in/out of bed, walking, or climbing stairs that is expected to last >3 days?: Yes (Initiates electronic notice to provider for possible PT consult) Does the patient have a NEW difficulty with communication that is expected to last >3 days?: Yes (Initiates electronic notice to provider for possible SLP consult) Is the patient deaf or have difficulty hearing?: No Does the patient have difficulty seeing, even when wearing glasses/contacts?: No Does the patient have difficulty concentrating, remembering, or making decisions?: Yes  Permission Sought/Granted                  Emotional Assessment  Appearance:: Appears stated age            Admission diagnosis:  Subdural hematoma (HCC) [S06.5XAA] Bilateral subdural hematomas (HCC) [S06.5XAA] Patient Active Problem List   Diagnosis Date Noted   Bilateral subdural hematomas (HCC) 01/29/2024   Compression of brain (HCC) 01/29/2024   Dementia without behavioral disturbance (HCC) 01/29/2024   Antiphospholipid antibody syndrome (HCC) 01/21/2024   Thrombocytopenia (HCC) 01/21/2024   B12 deficiency 01/21/2024   Neovascular glaucoma of right eye 11/28/2019   COPD (chronic obstructive pulmonary disease) (HCC) 06/28/2019   Depression 06/28/2019    Fibromyalgia 06/28/2019   GERD (gastroesophageal reflux disease) 06/28/2019   Hyperlipidemia 06/28/2019   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 06/28/2019   Rheumatoid arthritis (HCC) 06/28/2019   Diabetes (HCC) 05/17/2019   Essential hypertension 05/17/2019   DVT (deep venous thrombosis) (HCC) 04/21/2019   Right leg pain 03/21/2019   Compression fracture of L2 (HCC) 01/28/2017   Osteoporosis of multiple sites 01/28/2017   Pain in joint, pelvic region and thigh 01/28/2017   Abnormal intentional weight loss 12/16/2016   DDD (degenerative disc disease), lumbar 12/16/2016   Generalized osteoarthritis of hand 12/16/2016   Anticoagulant long-term use 01/31/2015   Episode of dizziness 01/25/2015   History of DVT of lower extremity 01/25/2015   Type 2 diabetes mellitus without complication (HCC) 01/25/2015   PCP:  Rayetta Humphrey, MD Pharmacy:   [facility] - [place], [place] - [address] [place] [address] Phone: [phone] Fax: [phone]     Social Drivers of Health (SDOH) Social History: SDOH Screenings   Food Insecurity: No Food Insecurity (01/29/2024)  Housing: Low Risk  (01/29/2024)  Transportation Needs: No Transportation Needs (01/29/2024)  Utilities: Not At Risk (01/29/2024)  Depression (PHQ2-9): Low Risk  (01/21/2024)  Social Connections: Socially Isolated (01/29/2024)  Tobacco Use: Medium Risk (01/29/2024)   SDOH Interventions:     Readmission Risk Interventions     No data to display

## 2024-02-01 NOTE — Progress Notes (Signed)
 Neurosurgery Progress Note  History: Alison Lopez is here for bilateral subdural hematoma  POD2: NAEO POD1: Brighter.  Intermittently not cooperating with moving RUE and RLE. No headaches today  Physical Exam: Vitals:   02/01/24 0600 02/01/24 0700  BP: (!) 127/50 139/77  Pulse: (!) 42 65  Resp: (!) 8 13  Temp:    SpO2: 98% 98%    AA O to self, place and situation but not time.   Strength:5/5 throughout LUE and LLE Appears to have right sided neglect this morning but does withdrawal to pain.   Data:  Other tests/results: CT reviewed - subdural hematomas much smaller.  Assessment/Plan:  Alison Lopez is stable after B burr holes for subdural hematoma  - mobilize - pain control - DVT prophylaxis ok to start - PTOT - OK to elevate HOB - q2 hr neuro checks x2, then switch to q4 hr neuro checks - will remove dressings today vs tomorrow  Manning Charity PA-C Department of Neurosurgery

## 2024-02-01 NOTE — Progress Notes (Signed)
 [facility]  - [place] [facility]   PATIENT NAME: Alison Lopez    MR#:  [id number]  DATE OF BIRTH:  06-17-1956  SUBJECTIVE:  no family at bedside. Patient came in after she was found to have altered mental status noted by family.  Patient more awake and able to communicate some. Has dysarthria speech. Has right-sided weakness. She is tolerating diet. Working currently with PT/OT   VITALS:  Blood pressure 124/72, pulse (!) 50, temperature 98.2 F (36.8 C), temperature source Oral, resp. rate 12, height 4\' 9"  (1.448 m), weight 49.9 kg, SpO2 98%.  PHYSICAL EXAMINATION:  limited GENERAL:  68 y.o.-year-old patient with no acute distress.  LUNGS: Normal breath sounds bilaterally, no wheezing CARDIOVASCULAR: S1, S2 normal. No murmur   ABDOMEN: Soft, nontender, nondistended.  EXTREMITIES: No  edema b/l.    NEUROLOGIC dysarthria, right upper and lower extremity weakness.  LABORATORY PANEL:  CBC Recent Labs  Lab 02/01/24 0411  WBC 10.4  HGB 11.4*  HCT 32.7*  PLT 64*    Chemistries  Recent Labs  Lab 01/29/24 1307 01/30/24 0311  NA 140 134*  K 4.2 4.4  CL 107 104  CO2 24 22  GLUCOSE 135* 176*  BUN 14 19  CREATININE 0.99 0.98  CALCIUM 9.1 8.7*  AST 15  --   ALT 9  --   ALKPHOS 39  --   BILITOT 0.5  --    Cardiac Enzymes No results for input(s): "TROPONINI" in the last 168 hours. RADIOLOGY:  CT HEAD WO CONTRAST ( ) Result Date: 01/31/2024 CLINICAL DATA:  68 year old female with altered mental status and bilateral subdural hematomas at presentation. Postoperative now bilateral subdural evacuation. EXAM: CT HEAD WITHOUT CONTRAST TECHNIQUE: Contiguous axial images were obtained from the base of the skull through the vertex without intravenous contrast. RADIATION DOSE REDUCTION: This exam was performed according to the departmental dose-optimization program which includes automated exposure control, adjustment of the mA and/or kV according to patient  size and/or use of iterative reconstruction technique. COMPARISON:  Head CT 01/29/2024. FINDINGS: Brain: Postoperative changes to both subdural spaces with bilateral subdural drains in place. Associated pneumocephalus. Decreased volume of right side subdural hematoma, residual now only 4-5 mm at most levels (previously up to 14 mm). Decreased volume of left side subdural hematoma, more mixed density now and residual up to maximum thickness of 11 mm (previously 17 mm). Subsequent decreased intracranial mass effect and improved ventricle size and configuration. Mild rightward midline shift now 1-2 mm (coronal image 35). Small volume left para falcine subdural blood and pneumocephalus also. Basilar cisterns are patent and mildly improved. No IVH or new areas of intracranial hemorrhage. Chronic parietal 0 and occipital lobe encephalomalacia more pronounced on the right. Small chronic left cerebellar infarcts. No cortically based acute infarct identified. Vascular: Calcified atherosclerosis at the skull base. No suspicious intracranial vascular hyperdensity. Skull: New bilateral vertex burr holes. Sinuses/Orbits: Visualized paranasal sinuses and mastoids are stable and well aerated. Other: Postoperative changes now to the bilateral scalp vertex with percutaneous drains and skin staples in place. Similar leftward gaze. Postoperative changes to the right globe. IMPRESSION: 1. Postoperative substantial decrease in bilateral subdural hematomas with bilateral subdural drains in place. Residual blood up to 11 mm on the Left, 4-5 mm on the Right. 2. Significantly regressed intracranial mass effect. Residual rightward midline shift now 1-2 mm. 3. No new intracranial abnormality. Chronic ischemic disease. Electronically Signed   By: Odessa Fleming M.D.   On: 01/31/2024 05:16  Assessment and Plan   Alison Lopez is a 68 year old female with dementia, history of DVT on Eliquis, chronic thrombocytopenia, B12 deficiency,  hypertension, who presents emergency department for chief concerns of altered mental status.  Daughter last saw her on Thursday around 5 PM. Patient reports that on Thursday evening, patient went to bed and urinated on herself and defecated on himself. This is not normal for her.   CT head wo contrast: Was read as positive bilateral mixed density subdural hematomas, somewhat biconvex, 17 mm maximum thickness on the left and 14 mm maximum thickness on the right.   Bilateral subdural hematomas [facility]) --Neurosurgery has been consulted by EDP, Dr. Marcell Barlow is aware --Hold Eliquis -- patient is status post bilateral frontal burr hole for drainage of subdural hematoma with subdural drains placed on each side -- CT scan of the head to be done tomorrow --3/23-- CT head this morning looks stable postop subdural hematoma evacuation. Perm neurosurgery okay to start PT OT and speech. Okay for DVT prophylaxis. --3/24-- working with PT OT. Will transfer out to MedSurg. Okay with neurosurgery. Bilateral drains from the scalp removed. TOC for discharge planning   chronic thrombocytopenia (HCC) --Goal platelet > 75 prior to neurosurgery in the AM --Discussed with hematologist, Dr. Cathie Hoops --Decadron 40 mg IV, daily, 4 days ordered on admission--completed --plt 52K--1 unit --78K-- 64K -- Dr. Cathie Hoops recommends continue to hold eliquis and follow-up with her as outpatient.  Type 2 diabetes mellitus without complication (HCC) --Insulin SSI with at bedtime coverage ordered --Goal inpatient blood glucose levels 140-180  Antiphospholipid antibody syndrome -- previously failed Coumadin -- recently started seeing hematology and repeat APS labs consistent with the diagnosis. Patient was started back on eliquis as outpatient recently---currently on hold   Hyperlipidemia Simvastatin    History of DVT of lower extremity and IVC filter placement --Home Eliquis will be held on admission in setting of bilateral subdural  hematoma   Depression Sertraline    COPD (chronic obstructive pulmonary disease) (HCC) --stable --prn nebs    Essential hypertension --bp soft    Procedures: status post bilateral subdural hematoma evacuation Family communication : daughter at bedside consults : neurosurgery CODE STATUS: full DVT Prophylaxis : SCD Level of care: Med-Surg Status is: Inpatient Remains inpatient appropriate because: status post Ines Bloomer hole surgery overall improving. Will transfer to MedSurg.  PT OT recommending acute inpatient rehab/CIR-- TOC for discharge planning  TOTAL TIME TAKING CARE OF THIS PATIENT: 50 minutes.  >50% time spent on counselling and coordination of care  Note: This dictation was prepared with Dragon dictation along with smaller phrase technology. Any transcriptional errors that result from this process are unintentional.  Enedina Finner M.D    [facility]   CC: Primary care physician; Rayetta Humphrey, MD

## 2024-02-01 NOTE — Progress Notes (Signed)

## 2024-02-02 DIAGNOSIS — S065XAA Traumatic subdural hemorrhage with loss of consciousness status unknown, initial encounter: Secondary | ICD-10-CM | POA: Diagnosis not present

## 2024-02-02 LAB — PREPARE PLATELET PHERESIS: Unit division: 0

## 2024-02-02 LAB — BPAM PLATELET PHERESIS
Blood Product Expiration Date: 202503242359
Unit Type and Rh: 6200

## 2024-02-02 LAB — GLUCOSE, CAPILLARY
Glucose-Capillary: 96 mg/dL (ref 70–99)
Glucose-Capillary: 87 mg/dL (ref 70–99)
Glucose-Capillary: 99 mg/dL (ref 70–99)
Glucose-Capillary: 109 mg/dL — ABNORMAL HIGH (ref 70–99)

## 2024-02-02 NOTE — Care Management Important Message (Signed)
 Important Message  Patient Details  Name: Alison Lopez MRN: [id number] Date of Birth: Jun 25, 1956   Important Message Given:  Yes - Medicare IM     Cristela Blue, CMA 02/02/2024, 12:15 PM

## 2024-02-02 NOTE — PMR Pre-admission (Signed)
 PMR Admission Coordinator Pre-Admission Assessment  Patient: Alison Lopez is an 68 y.o., female MRN: [id number] DOB: [birth year] Height: 4\' 9"  (144.8 cm) Weight: 49.9 kg  Insurance Information HMO: yes    PPO:      PCP:      IPA:      80/20:      OTHER:  PRIMARY: [employer] HMO      Policy#: [id number]      Subscriber: pt CM Name: Denny Peon      Phone#: [phone] ext  [phone]    Fax#: [phone] Pre-Cert#: [id number] auth from [place] with expedited appeals with updates due to fax listed above on 4/7      Employer:  Benefits:  Phone #: [phone]     Name:  Eff. Date: 11/11/23     Deduct: $257 (met)      Out of Pocket Max: [phone] ($375 met)      Life Max: n/a CIR: $2185/admit      SNF: 20 full days Outpatient: 80%     Co-Pay: 20% Home Health: 100%      Co-Pay:  DME: 80%     Co-Pay: 20% Providers:  SECONDARY: Medicaid of [place]      Policy#: [id number]     Phone#:   Financial Counselor:       Phone#:   The "Data Collection Information Summary" for patients in Inpatient Rehabilitation Facilities with attached "Privacy Act Statement-Health Care Records" was provided and verbally reviewed with: Patient and Family  Emergency Contact Information Contact Information     Name Relation Home Work Mobile   [place] Daughter   [phone]   Prudencio Burly   [phone]      Other Contacts     Name Relation Home Work Mobile   Verdel (Chop) Clearence Cheek   [phone]       Current Medical History  Patient Admitting Diagnosis:  bilat SDH s/p burr holes  History of Present Illness: Alison Lopez is a 68 year old right-handed female with history of DVT on Eliquis, dementia maintained on Aricept, chronic thrombocytopenia, B12 deficiency, hypertension, diabetes mellitus.  Presented 01/29/2024 to [facility] with altered mental status, right-sided weakness and urinary/fecal incontinence.  Family denied any known recent fall or trauma.  Cranial CT scan  positive for bilateral mixed density subdural hematoma somewhat biconvex with a 17 mm maximum thickness on the left and 14 mm maximum thickness on the right.  Subsequent mass effect of both hemispheres although no significant midline shift.  Basilar cisterns remain patent.  No skull fracture or other acute intracranial abnormality.  Admission chemistries unremarkable except glucose 135, alcohol negative, platelets 52,000.  Underwent bilateral frontal burr hole for drainage of subdural hematoma 01/30/2024 per Dr. Marcell Barlow.  Latest follow-up cranial CT scan 01/31/2024 showed postoperative substantial decrease in bilateral subdural hematomas.  Residual blood up to 11 mm on the left, 4-5 mm on the right.  Significantly regressed intracranial mass effect.   Residual rightward midline shift now 1-2 mm.  Follow-up cranial CT scan 02/08/2024 pending.  Therapy evaluations completed and pt was recommended for a comprehensive rehab program.    Complete NIHSS TOTAL: 11  Patient's medical record from [facility] has been reviewed by the rehabilitation admission coordinator and physician.  Past Medical History  Past Medical History:  Diagnosis Date   Anxiety    Arthritis    Collagen vascular disease (HCC)    COPD (chronic obstructive pulmonary disease) (HCC)    DDD (degenerative disc disease), lumbar  Dementia (HCC)    Depression    Diabetes mellitus without complication (HCC)    DVT (deep venous thrombosis) (HCC)    Fibromyalgia    GERD (gastroesophageal reflux disease)    Graves' disease with exophthalmos    HLD (hyperlipidemia)    Hypertension    Hypothyroidism    Lupus    Osteoporosis     Has the patient had major surgery during 100 days prior to admission? Yes  Family History   family history includes Heart attack in her father; Stomach cancer in her mother.  Current Medications  Current Facility-Administered Medications:    ascorbic acid (VITAMIN C) tablet 1,000 mg, 1,000 mg, Oral, Daily,  Venetia Night, MD, 1,000 mg at 02/08/24 0846   Chlorhexidine Gluconate Cloth 2 % PADS 6 each, 6 each, Topical, Q0600, Venetia Night, MD, 6 each at 02/07/24 9147   cyanocobalamin (VITAMIN B12) tablet 1,000 mcg, 1,000 mcg, Oral, Daily, Venetia Night, MD, 1,000 mcg at 02/08/24 0846   donepezil (ARICEPT) tablet 10 mg, 10 mg, Oral, QHS, Venetia Night, MD, 10 mg at 02/07/24 2153   enoxaparin (LOVENOX) injection 40 mg, 40 mg, Subcutaneous, Q24H, Enedina Finner, MD, 40 mg at 02/07/24 1313   ferrous sulfate tablet 325 mg, 325 mg, Oral, Q breakfast, Venetia Night, MD, 325 mg at 02/08/24 0846   hydrALAZINE (APRESOLINE) injection 5 mg, 5 mg, Intravenous, Q4H PRN, Venetia Night, MD   insulin aspart (novoLOG) injection 0-5 Units, 0-5 Units, Subcutaneous, QHS, Venetia Night, MD, 2 Units at 01/29/24 2123   insulin aspart (novoLOG) injection 0-9 Units, 0-9 Units, Subcutaneous, TID WC, Venetia Night, MD, 2 Units at 02/07/24 1312   levothyroxine (SYNTHROID) tablet 125 mcg, 125 mcg, Oral, Q0600, Venetia Night, MD, 125 mcg at 02/08/24 0606   Oral care mouth rinse, 15 mL, Mouth Rinse, PRN, Wouk, Wilfred Curtis, MD   polyethylene glycol (MIRALAX / GLYCOLAX) packet 17 g, 17 g, Oral, Daily, Wouk, Wilfred Curtis, MD, 17 g at 02/06/24 8295   rosuvastatin (CRESTOR) tablet 20 mg, 20 mg, Oral, Daily, Venetia Night, MD, 20 mg at 02/08/24 6213   senna (SENOKOT) tablet 8.6 mg, 1 tablet, Oral, Daily, Wouk, Wilfred Curtis, MD, 8.6 mg at 02/08/24 0865  Patients Current Diet:  Diet Order             Diet - low sodium heart healthy           Diet Heart Room service appropriate? Yes; Fluid consistency: Thin  Diet effective now                   Precautions / Restrictions Precautions Precautions: Fall Restrictions Weight Bearing Restrictions Per Provider Order: No   Has the patient had 2 or more falls or a fall with injury in the past year? Unknown  Prior Activity  Level Household: household ambulatory with rollator, assist for ADLs and iADLs, lives with daughter who drives her to appointments  Prior Functional Level Self Care: Did the patient need help bathing, dressing, using the toilet or eating? Needed some help  Indoor Mobility: Did the patient need assistance with walking from room to room (with or without device)? Independent  Stairs: Did the patient need assistance with internal or external stairs (with or without device)? Needed some help  Functional Cognition: Did the patient need help planning regular tasks such as shopping or remembering to take medications? Needed some help  Patient Information Are you of Hispanic, Latino/a,or Spanish origin?: A. No, not of Hispanic, Latino/a, or [place]  origin, X. Patient unable to respond (all answers via proxy) What is your race?: A. White, X. Patient unable to respond Do you need or want an interpreter to communicate with a doctor or health care staff?: 9. Unable to respond  Patient's Response To:  Health Literacy and Transportation Is the patient able to respond to health literacy and transportation needs?: No Health Literacy - How often do you need to have someone help you when you read instructions, pamphlets, or other written material from your doctor or pharmacy?: Patient unable to respond In the past 12 months, has lack of transportation kept you from medical appointments or from getting medications?: No In the past 12 months, has lack of transportation kept you from meetings, work, or from getting things needed for daily living?: No  Home Assistive Devices / Equipment Home Equipment: Rollator (4 wheels)  Prior Device Use: Indicate devices/aids used by the patient prior to current illness, exacerbation or injury? Walker  Current Functional Level Cognition  Arousal/Alertness: Awake/alert Overall Cognitive Status: Impaired/Different from baseline Orientation Level: Oriented to person,  Disoriented to place, Disoriented to time, Disoriented to situation Attention: Sustained Sustained Attention: Impaired Sustained Attention Impairment: Verbal basic Memory: Impaired Memory Impairment: Decreased recall of new information, Storage deficit Awareness: Impaired Awareness Impairment: Intellectual impairment Problem Solving: Impaired Problem Solving Impairment: Verbal basic Executive Function: Organizing Organizing: Impaired Organizing Impairment: Verbal basic Behaviors:  (none) Safety/Judgment:  (emerging- reports concern for fall if she attempted to stand)    Extremity Assessment (includes Sensation/Coordination)  Upper Extremity Assessment: RUE deficits/detail (LUE appears WFL) RUE Deficits / Details: trace activiation throughout RUE with multi modal cueing, PROM WFL; pt imparied cognition affects sensation assessment; will continue to assess RUE Coordination: decreased fine motor, decreased gross motor  Lower Extremity Assessment: Defer to PT evaluation RLE Deficits / Details: again little to no AROM or ability to get more than seemingly reflexive responses with trying to activate R LE    ADLs  Overall ADL's : Needs assistance/impaired Eating/Feeding: Set up Grooming: Wash/dry face, Brushing hair, Standing, Oral care Upper Body Dressing : Maximal assistance Upper Body Dressing Details (indicate cue type and reason): anticipate Lower Body Dressing: Maximal assistance Lower Body Dressing Details (indicate cue type and reason): donn ted hose/socks, raised LLE to assist Toilet Transfer: Regular Toilet, Ambulation, Cueing for safety, Cueing for sequencing, +2 for safety/equipment (HHA) Toileting- Clothing Manipulation and Hygiene: Maximal assistance, Sit to/from stand Functional mobility during ADLs: +2 for safety/equipment (HHA) General ADL Comments: Pt completed standing grooming tasks at sink level then sat to complete oral care for additional stability. Pt benefited  from moderate verbal and visual cues for tasks sequence.    Mobility  Overal bed mobility: Needs Assistance Bed Mobility: Supine to Sit Rolling: Min assist Supine to sit: Min assist Sit to supine: Min assist General bed mobility comments: Pt requires assistance for her right side and cues    Transfers  Overall transfer level: Needs assistance Equipment used: 1 person hand held assist Transfers: Sit to/from Stand Sit to Stand: Min assist General transfer comment: L hand held assist and steadying for balance    Ambulation / Gait / Stairs / Wheelchair Mobility  Ambulation/Gait Ambulation/Gait assistance: Editor, commissioning (Feet): 200 Feet Assistive device: 1 person hand held assist Gait Pattern/deviations: Step-through pattern, Decreased step length - right, Decreased step length - left, Narrow base of support, Decreased stride length General Gait Details: patient doing well with ambulation. Requires cues for direction due to poor vision.  Follows 1 step direction with increased time and inconsistently. Able to increase distance significantly this session. Patient requires heavy cues for safety, ADLs, ambulation. Poor vision and cognition. Gait velocity: decreased Pre-gait activities: with physical assistance, patient is able to take 3 side steps to the right with physical assistance for advancement of RLE. weight shifting faciliation to the left  movement of RLE with active hip movement noted. patient relying on the bed for posterior leg support    Posture / Balance Balance Overall balance assessment: Needs assistance Sitting-balance support: Feet supported Sitting balance-Leahy Scale: Fair Postural control: Right lateral lean Standing balance support: Single extremity supported, During functional activity Standing balance-Leahy Scale: Fair Standing balance comment: Requires hands on assist for standing mobility. Poor balance.    Special needs/care consideration Skin burr hole  incisions and Behavioral consideration hx of dementia   Previous Home Environment (from acute therapy documentation) Living Arrangements: Children  Lives With: Daughter Available Help at Discharge: Family, Available PRN/intermittently Type of Home: House Home Access: Stairs to enter Entrance Stairs-Rails: Right Entrance Stairs-Number of Steps: 4 Home Care Services: No Additional Comments: information from chart, will need to confirm  Discharge Living Setting Plans for Discharge Living Setting: Lives with (comment) (daughter) Type of Home at Discharge: House Discharge Home Layout: One level Discharge Home Access: Stairs to enter Entrance Stairs-Rails: Right Entrance Stairs-Number of Steps: 4 Discharge Bathroom Shower/Tub: Tub/shower unit Discharge Bathroom Toilet: Standard Discharge Bathroom Accessibility: Yes How Accessible: Accessible via walker Does the patient have any problems obtaining your medications?: No  Social/Family/Support Systems Anticipated Caregiver: daughter Hospital doctor, Anticipated Caregiver's Contact Information: [phone] Ability/Limitations of Caregiver: Hospital doctor works, she is arranging 24/7 coverage Caregiver Availability: 24/7 Discharge Plan Discussed with Primary Caregiver: Yes Is Caregiver In Agreement with Plan?: Yes Does Caregiver/Family have Issues with Lodging/Transportation while Pt is in Rehab?: No  Goals Patient/Family Goal for Rehab: PT/OT supervision, SLP supervision/min assist Expected length of stay: 12-14 days Additional Information: Discharge plan: return to pt's home where she lives with her daughter.  She will have 24/7 arranged by daughter Pt/Family Agrees to Admission and willing to participate: Yes Program Orientation Provided & Reviewed with Pt/Caregiver Including Roles  & Responsibilities: Yes  Decrease burden of Care through IP rehab admission: n/a  Possible need for SNF placement upon discharge:  Not anticipated.  Plan for discharge  back to pt's home, daughter is arranging 24/7 supervision.   Patient Condition: I have reviewed medical records from [facility], spoken with CSW, and daughter. I discussed via phone for inpatient rehabilitation assessment.  Patient will benefit from ongoing PT, OT, and SLP, can actively participate in 3 hours of therapy a day 5 days of the week, and can make measurable gains during the admission.  Patient will also benefit from the coordinated team approach during an Inpatient Acute Rehabilitation admission.  The patient will receive intensive therapy as well as Rehabilitation physician, nursing, social worker, and care management interventions.  Due to bladder management, bowel management, safety, skin/wound care, disease management, medication administration, pain management, and patient education the patient requires 24 hour a day rehabilitation nursing.  The patient is currently min assist with mobility and basic ADLs.  Discharge setting and therapy post discharge at home with home health is anticipated.  Patient has agreed to participate in the Acute Inpatient Rehabilitation Program and will admit today.  Preadmission Screen Completed By:  Stephania Fragmin, PT, DPT 02/08/2024 10:38 AM ______________________________________________________________________   Discussed status with Dr. Wynn Banker on 02/08/24 at 10:38 AM  and received approval for admission today.  Admission Coordinator:  Stephania Fragmin, PT, DPT time 10:38 AM Dorna Bloom 02/08/24    Assessment/Plan: Diagnosis: Bilateral SDH Does the need for close, 24 hr/day Medical supervision in concert with the patient's rehab needs make it unreasonable for this patient to be served in a less intensive setting? Yes Co-Morbidities requiring supervision/potential complications: History of DVT on chronic Eliquis, chronic thrombocytopenia, hypertension, diabetes, B12 deficiency, recent burr hole Due to bladder management, bowel management, safety, skin/wound care,  disease management, medication administration, pain management, and patient education, does the patient require 24 hr/day rehab nursing? Yes Does the patient require coordinated care of a physician, rehab nurse, PT, OT, and SLP to address physical and functional deficits in the context of the above medical diagnosis(es)? Yes Addressing deficits in the following areas: balance, endurance, locomotion, strength, transferring, bowel/bladder control, bathing, dressing, feeding, grooming, toileting, cognition, and psychosocial support Can the patient actively participate in an intensive therapy program of at least 3 hrs of therapy 5 days a week? Yes The potential for patient to make measurable gains while on inpatient rehab is good Anticipated functional outcomes upon discharge from inpatient rehab: supervision PT, supervision OT, supervision and min assist SLP Estimated rehab length of stay to reach the above functional goals is: 12 to 14 days Anticipated discharge destination: Home 10. Overall Rehab/Functional Prognosis: good   MD Signature: Erick Colace M.D. [facility] Fellow Am Acad of Phys Med and Rehab Diplomate Am Board of Electrodiagnostic Med Fellow Am Board of Interventional Pain

## 2024-02-02 NOTE — Progress Notes (Signed)
 [facility]  - [place] [facility]   PATIENT NAME: Alison Lopez    MR#:  [id number]  DATE OF BIRTH:  04-14-1956  SUBJECTIVE:  no family at bedside. Patient came in after she was found to have altered mental status noted by family.  Seen along with physical therapist in the room. When asked to raise her right arm patient was able to do it very well pretty close to her forehead. She is able to move her right lower extremity on her own as well. Much improved then yesterday.  VITALS:  Blood pressure (!) 159/75, pulse 62, temperature 98.3 F (36.8 C), resp. rate 19, height 4\' 9"  (1.448 m), weight 49.9 kg, SpO2 99%.  PHYSICAL EXAMINATION:  limited GENERAL:  68 y.o.-year-old patient with no acute distress.  LUNGS: Normal breath sounds bilaterally, no wheezing CARDIOVASCULAR: S1, S2 normal. No murmur   ABDOMEN: Soft, nontender, nondistended.  EXTREMITIES: No  edema b/l.    NEUROLOGIC dysarthria, right upper and lower extremity weakness--improving  LABORATORY PANEL:  CBC Recent Labs  Lab 02/01/24 0411  WBC 10.4  HGB 11.4*  HCT 32.7*  PLT 64*    Chemistries  Recent Labs  Lab 01/29/24 1307 01/30/24 0311  NA 140 134*  K 4.2 4.4  CL 107 104  CO2 24 22  GLUCOSE 135* 176*  BUN 14 19  CREATININE 0.99 0.98  CALCIUM 9.1 8.7*  AST 15  --   ALT 9  --   ALKPHOS 39  --   BILITOT 0.5  --      Assessment and Plan   Alison Lopez is a 68 year old female with dementia, history of DVT on Eliquis, chronic thrombocytopenia, B12 deficiency, hypertension, who presents emergency department for chief concerns of altered mental status.  Daughter last saw her on Thursday around 5 PM. Patient reports that on Thursday evening, patient went to bed and urinated on herself and defecated on himself. This is not normal for her.   CT head wo contrast: Was read as positive bilateral mixed density subdural hematomas, somewhat biconvex, 17 mm maximum thickness on the left and 14  mm maximum thickness on the right.   Bilateral subdural hematomas [facility]) --Neurosurgery has been consulted by EDP, Dr. Marcell Barlow is aware --Hold Eliquis -- patient is status post bilateral frontal burr hole for drainage of subdural hematoma with subdural drains placed on each side -- CT scan of the head to be done tomorrow --3/23-- CT head this morning looks stable postop subdural hematoma evacuation. Perm neurosurgery okay to start PT OT and speech. Okay for DVT prophylaxis. --3/24-- working with PT OT. Will transfer out to MedSurg. Okay with neurosurgery. Bilateral drains from the scalp removed. TOC for discharge planning --3/25-- improving right upper and lower extremity weakness. Seen patient today with physical therapist. Recommending CIR. TOC for discharge planning   chronic thrombocytopenia (HCC) --Goal platelet > 75 prior to neurosurgery in the AM --Decadron 40 mg IV, daily, 4 days ordered on admission--completed --plt 52K--1 unit --78K-- 64K -- Dr. Cathie Hoops recommends continue to hold eliquis and follow-up with her as outpatient.  Type 2 diabetes mellitus without complication (HCC) --Insulin SSI with at bedtime coverage ordered --Goal inpatient blood glucose levels 140-180  Antiphospholipid antibody syndrome -- previously failed Coumadin -- recently started seeing hematology and repeat APS labs consistent with the diagnosis. Patient was started back on eliquis as outpatient recently---currently on hold   Hyperlipidemia --Simvastatin    History of DVT of lower extremity and IVC filter  placement --Home Eliquis will be held  in setting of bilateral subdural hematoma   Depression/h/o Dementia --Sertraline    COPD (chronic obstructive pulmonary disease) (HCC) --stable --prn nebs    Essential hypertension --bp soft    Procedures: status post bilateral subdural hematoma evacuation Family communication : none today consults : neurosurgery CODE STATUS: full DVT Prophylaxis :  SCD Level of care: Med-Surg Status is: Inpatient Remains inpatient appropriate because: status post Burr hole surgery overall improving. Will transfer to MedSurg.  PT OT recommending acute inpatient rehab/CIR-- TOC for discharge planning  TOTAL TIME TAKING CARE OF THIS PATIENT: 40 minutes.  >50% time spent on counselling and coordination of care  Note: This dictation was prepared with Dragon dictation along with smaller phrase technology. Any transcriptional errors that result from this process are unintentional.  Enedina Finner M.D    [facility]   CC: Primary care physician; Rayetta Humphrey, MD

## 2024-02-02 NOTE — TOC PASRR Note (Signed)
 RE: Alison Lopez Date of Birth: 13-May-1956 Date: 02/02/2024   To Whom It May Concern:  Please be advised that the above-named patient will require a short-term nursing home stay - anticipated 30 days or less for rehabilitation and strengthening.  The plan is for return home.

## 2024-02-02 NOTE — Progress Notes (Signed)
 Inpatient Rehab Coordinator Note:  I spoke with patient's daughter, Joice Lofts, over the phone to discuss CIR recommendations and goals/expectations of CIR stay.  We reviewed 3 hrs/day of therapy, physician follow up, and average length of stay 2 weeks (dependent upon progress) with goals of 24/7 supervision.  Amber works during the day and typically pt has been home alone during the day.  She is would like to try and pursue CIR so I will start insurance auth request today while she works on pulling together 24/7 support.  I will f/u with her tomorrow to confirm discharge plan.   Estill Dooms, PT, DPT Admissions Coordinator [phone] 02/02/24  3:06 PM

## 2024-02-02 NOTE — [place] FL2 (Signed)
 [place] MEDICAID [id number] LEVEL OF CARE FORM     IDENTIFICATION  Patient Name: Alison Lopez Birthdate: 11/25/1955 Sex: female Admission Date (Current Location): 01/29/2024  [facility] and [place] Number:  Chiropodist and Address:  [facility], [address] [place], [place] [address]      Provider Number: [id number]  Attending Physician Name and Address:  Enedina Finner, MD  Relative Name and Phone Number:       Current Level of Care: Hospital Recommended Level of Care: Skilled Nursing Facility Prior Approval Number:    Date Approved/Denied:   PASRR Number: Manual review  Discharge Plan: SNF    Current Diagnoses: Patient Active Problem List   Diagnosis Date Noted   Bilateral subdural hematomas (HCC) 01/29/2024   Compression of brain (HCC) 01/29/2024   Dementia without behavioral disturbance (HCC) 01/29/2024   Antiphospholipid antibody syndrome (HCC) 01/21/2024   Thrombocytopenia (HCC) 01/21/2024   B12 deficiency 01/21/2024   Neovascular glaucoma of right eye 11/28/2019   COPD (chronic obstructive pulmonary disease) (HCC) 06/28/2019   Depression 06/28/2019   Fibromyalgia 06/28/2019   GERD (gastroesophageal reflux disease) 06/28/2019   Hyperlipidemia 06/28/2019   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 06/28/2019   Rheumatoid arthritis (HCC) 06/28/2019   Diabetes (HCC) 05/17/2019   Essential hypertension 05/17/2019   DVT (deep venous thrombosis) (HCC) 04/21/2019   Right leg pain 03/21/2019   Compression fracture of L2 (HCC) 01/28/2017   Osteoporosis of multiple sites 01/28/2017   Pain in joint, pelvic region and thigh 01/28/2017   Abnormal intentional weight loss 12/16/2016   DDD (degenerative disc disease), lumbar 12/16/2016   Generalized osteoarthritis of hand 12/16/2016   Anticoagulant long-term use 01/31/2015   Episode of dizziness 01/25/2015   History of DVT of lower extremity 01/25/2015   Type 2 diabetes mellitus without  complication (HCC) 01/25/2015    Orientation RESPIRATION BLADDER Height & Weight     Self  Normal Continent, External catheter Weight: 110 lb 0.2 oz (49.9 kg) Height:  4\' 9"  (144.8 cm)  BEHAVIORAL SYMPTOMS/MOOD NEUROLOGICAL BOWEL NUTRITION STATUS   (None)  (Dementia) Continent Diet (Heart healthy)  AMBULATORY STATUS COMMUNICATION OF NEEDS Skin   Limited Assist Verbally Bruising, Surgical wounds (Incision on head: Non-adherent.)                       Personal Care Assistance Level of Assistance  Feeding, Bathing, Dressing Bathing Assistance: Maximum assistance Feeding assistance: Limited assistance Dressing Assistance: Maximum assistance     Functional Limitations Info  Sight, Hearing, Speech Sight Info: Adequate Hearing Info: Adequate Speech Info: Adequate    SPECIAL CARE FACTORS FREQUENCY  PT (By licensed PT), OT (By licensed OT), Speech therapy     PT Frequency: 5 x week OT Frequency: 5 x week     Speech Therapy Frequency: 5 x week      Contractures Contractures Info: Not present    Additional Factors Info  Code Status, Allergies Code Status Info: Full Code Allergies Info: Morphine And Codeine, Montelukast, Antihistamines, Diphenhydramine-type, Aspirin, Benadryl (Diphenhydramine Hcl), Codeine           Current Medications (02/02/2024):  This is the current hospital active medication list Current Facility-Administered Medications  Medication Dose Route Frequency Provider Last Rate Last Admin   0.9 %  sodium chloride infusion (Manually program via Guardrails IV Fluids)   Intravenous Once Venetia Night, MD   Held at 01/29/24 1731   acetaminophen (TYLENOL) tablet 650 mg  650  mg Oral Q6H PRN Venetia Night, MD   650 mg at 01/31/24 1807   Or   acetaminophen (TYLENOL) suppository 650 mg  650 mg Rectal Q6H PRN Venetia Night, MD       ascorbic acid (VITAMIN C) tablet 1,000 mg  1,000 mg Oral Daily Venetia Night, MD   1,000 mg at 02/02/24 1210    Chlorhexidine Gluconate Cloth 2 % PADS 6 each  6 each Topical Q0600 Venetia Night, MD   6 each at 02/02/24 [phone]   cyanocobalamin (VITAMIN B12) tablet 1,000 mcg  1,000 mcg Oral Daily Venetia Night, MD   1,000 mcg at 02/02/24 1209   donepezil (ARICEPT) tablet 10 mg  10 mg Oral QHS Venetia Night, MD   10 mg at 02/01/24 2253   enoxaparin (LOVENOX) injection 40 mg  40 mg Subcutaneous Q24H Enedina Finner, MD   40 mg at 02/02/24 1208   ferrous sulfate tablet 325 mg  325 mg Oral Q breakfast Venetia Night, MD   325 mg at 02/02/24 1209   hydrALAZINE (APRESOLINE) injection 5 mg  5 mg Intravenous Q4H PRN Venetia Night, MD       insulin aspart (novoLOG) injection 0-5 Units  0-5 Units Subcutaneous QHS Venetia Night, MD   2 Units at 01/29/24 2123   insulin aspart (novoLOG) injection 0-9 Units  0-9 Units Subcutaneous TID WC Venetia Night, MD   2 Units at 02/01/24 1141   levothyroxine (SYNTHROID) tablet 125 mcg  125 mcg Oral Q0600 Venetia Night, MD   125 mcg at 02/02/24 0704   memantine (NAMENDA) tablet 5 mg  5 mg Oral Daily Venetia Night, MD   5 mg at 02/02/24 1211   ondansetron (ZOFRAN) tablet 4 mg  4 mg Oral Q6H PRN Venetia Night, MD       Or   ondansetron [facility]) injection 4 mg  4 mg Intravenous Q6H PRN Venetia Night, MD       rosuvastatin (CRESTOR) tablet 20 mg  20 mg Oral Daily Venetia Night, MD   20 mg at 02/02/24 1209   sertraline (ZOLOFT) tablet 25 mg  25 mg Oral Daily Venetia Night, MD   25 mg at 02/02/24 1210     Discharge Medications: Please see discharge summary for a list of discharge medications.  Relevant Imaging Results:  Relevant Lab Results:   Additional Information SS#: [id number]  Margarito Liner, LCSW

## 2024-02-02 NOTE — TOC Progression Note (Addendum)
 Transition of Care [facility]) - Progression Note    Patient Details  Name: YARETZY OLAZABAL MRN: [id number] Date of Birth: 01-26-1956  [facility]) CM/SW Contact  Margarito Liner, LCSW Phone Number: 02/02/2024, 1:29 PM  Clinical Narrative:   Per RNCM that spoke to daughter yesterday, she was agreeable to SNF backup workup in case patient is unable to admit to inpatient rehab. CSW sent out referral. PASARR under manual review.  2:29 pm: CSW uploaded requested documents into [employer] for PASARR review.  Expected Discharge Plan:  (Acute Inpatient rehab v/s SNF) Barriers to Discharge: No Barriers Identified  Expected Discharge Plan and Services   Discharge Planning Services: CM Consult Post Acute Care Choice: IP Rehab, Skilled Nursing Facility Living arrangements for the past 2 months: Single Family Home                                       Social Determinants of Health (SDOH) Interventions SDOH Screenings   Food Insecurity: No Food Insecurity (01/29/2024)  Housing: Low Risk  (01/29/2024)  Transportation Needs: No Transportation Needs (01/29/2024)  Utilities: Not At Risk (01/29/2024)  Depression (PHQ2-9): Low Risk  (01/21/2024)  Social Connections: Socially Isolated (01/29/2024)  Tobacco Use: Medium Risk (01/29/2024)    Readmission Risk Interventions     No data to display

## 2024-02-02 NOTE — TOC PASRR Note (Signed)
 Re: Alison Lopez Date of Birth: [birth year] Date: 02/02/2024   To Whom It May Concern:  Please be advised that the above-name patient's dementia diagnosis is primary and supersedes her mental illness.

## 2024-02-02 NOTE — Progress Notes (Signed)
 Neurosurgery Progress Note  History: Alison Lopez is here for bilateral subdural hematoma  POD3: Denies headaches. No acute events overnight. POD2: NAEO POD1: Brighter.  Intermittently not cooperating with moving RUE and RLE. No headaches today  Physical Exam: Vitals:   02/02/24 0444 02/02/24 0808  BP: (!) 151/76 (!) 161/72  Pulse: (!) 54 65  Resp:  18  Temp: 97.7 F (36.5 C) 97.8 F (36.6 C)  SpO2: 99% 100%    AA O to self, place and situation but not time.   Strength:5/5 throughout LUE and LLE Neglects RUE. Some movement in right hamstring and quad, but unable to engage in DF/PF  Dressings removed today. Incisions C/D/I. Staples and sutures remain in place.  Data:  Other tests/results: CT reviewed - subdural hematomas much smaller.  Assessment/Plan:  LEASIA SWANN is stable after B burr holes for subdural hematoma  - mobilize - pain control - DVT prophylaxis  - PTOT - OK to elevate HOB - q2 hr neuro checks x2, then switch to q4 hr neuro checks - Dressings removed today  Joan Flores PA-C Department of Neurosurgery

## 2024-02-02 NOTE — Progress Notes (Signed)
 Physical Therapy Treatment Patient Details Name: Alison Lopez MRN: [id number] DOB: 12-24-1955 Today's Date: 02/02/2024   History of Present Illness 68 y/o female here with AMS, found to have b/l subdural hematomas now s/p B burr holes for subdural hematoma.  PMH: dementia, history of DVT on Eliquis, chronic thrombocytopenia, B12 deficiency, hypertension    PT Comments  The patient is cooperative and eager to participate with mobility today. She is able to mobilize with the assistance of one person today which is an improvement from prior session. The patient is unable to initiate movement in the right arm or leg to command prior to mobilizing. After walking, she has active right shoulder flexion to at least 60 degrees to command, performed multiple repetitions. No right elbow/wrist/hand movement noted. The patient walked 25 ft with Min A for weight shifting facilitation for advancement of RLE. Active right ankle dorsiflexion is noted through partial ROM with ambulation, however patient is unable to perform to command. No right knee buckling with weight bearing. Continue to recommend rehabilitation > 3 hours/day after this hospital stay.    If plan is discharge home, recommend the following: Assistance with cooking/housework;Assistance with feeding;Assist for transportation;Help with stairs or ramp for entrance;Supervision due to cognitive status;Direct supervision/assist for medications management;Direct supervision/assist for financial management;A lot of help with bathing/dressing/bathroom;A lot of help with walking and/or transfers   Can travel by private vehicle     No  Equipment Recommendations  Wheelchair (measurements PT)    Recommendations for Other Services Rehab consult     Precautions / Restrictions Precautions Precautions: Fall Recall of Precautions/Restrictions: Impaired Restrictions Weight Bearing Restrictions Per Provider Order: No     Mobility  Bed Mobility Overal  bed mobility: Needs Assistance Bed Mobility: Supine to Sit, Sit to Supine     Supine to sit: Mod assist Sit to supine: Min assist   General bed mobility comments: assistance and cues for right hemi body with increased right side automatic movement with bed mobility. multi modal cues provided    Transfers Overall transfer level: Needs assistance Equipment used: 1 person hand held assist Transfers: Sit to/from Stand Sit to Stand: Min assist           General transfer comment: 2 standing bouts performed with cues for anterior weight shifting    Ambulation/Gait Ambulation/Gait assistance: Min assist Gait Distance (Feet): 25 Feet Assistive device: 1 person hand held assist Gait Pattern/deviations: Step-to pattern, Decreased stance time - right, Decreased stride length, Decreased step length - right Gait velocity: decreased     General Gait Details: faciliation for weight shifting to left for advancement of RLE. no knee buckling with weight bearing. patient is unable to initiate right side dorsiflexion to command but active dorsiflexion is noted with ambulation through partial ROM.   Stairs             Wheelchair Mobility     Tilt Bed    Modified Rankin (Stroke Patients Only)       Balance Overall balance assessment: Needs assistance Sitting-balance support: Feet supported Sitting balance-Leahy Scale: Fair     Standing balance support: Single extremity supported Standing balance-Leahy Scale: Poor Standing balance comment: external support provided for safety. facilitation for hand washing at sink with maximal assistance required for right arm. patient does look down at the right arm with standing with maximal cues and using wall mirror for feedback  Communication Communication Communication: Impaired Factors Affecting Communication: Difficulty expressing self  Cognition Arousal: Alert Behavior During Therapy: WFL for  tasks assessed/performed   PT - Cognitive impairments: History of cognitive impairments, Difficult to assess Difficult to assess due to: Impaired communication                     PT - Cognition Comments: right side inattention. patient is talking more today and able to follow single step commands with increased time. Following commands: Impaired Following commands impaired: Follows one step commands with increased time, Follows one step commands inconsistently    Cueing Cueing Techniques: Verbal cues, Gestural cues, Tactile cues, Visual cues  Exercises      General Comments General comments (skin integrity, edema, etc.): the patient was unable to move her right side initially to command. after standing mobility and return to bed, patient is able to activate right hip/knee movement to command as well as right shoulder flexion to at least 60 degrees. no right elbow or hand/wrist movement noted      Pertinent Vitals/Pain Pain Assessment Pain Assessment: No/denies pain    Home Living                          Prior Function            PT Goals (current goals can now be found in the care plan section) Acute Rehab PT Goals Patient Stated Goal: to go home with her daughter PT Goal Formulation: With patient Time For Goal Achievement: 02/13/24 Potential to Achieve Goals: Fair Progress towards PT goals: Progressing toward goals    Frequency    Min 2X/week      PT Plan      Co-evaluation              AM-PAC PT "6 Clicks" Mobility   Outcome Measure  Help needed turning from your back to your side while in a flat bed without using bedrails?: A Little Help needed moving from lying on your back to sitting on the side of a flat bed without using bedrails?: A Little Help needed moving to and from a bed to a chair (including a wheelchair)?: A Little Help needed standing up from a chair using your arms (e.g., wheelchair or bedside chair)?: A Little Help  needed to walk in hospital room?: A Little Help needed climbing 3-5 steps with a railing? : Total 6 Click Score: 16    End of Session Equipment Utilized During Treatment: Gait belt Activity Tolerance: Patient tolerated treatment well Patient left: in bed;with call bell/phone within reach;with bed alarm set   PT Visit Diagnosis: Muscle weakness (generalized) (M62.81);Difficulty in walking, not elsewhere classified (R26.2);Unsteadiness on feet (R26.81);Other symptoms and signs involving the nervous system (R29.898);Hemiplegia and hemiparesis Hemiplegia - Right/Left: Right Hemiplegia - dominant/non-dominant: Dominant Hemiplegia - caused by: Other Nontraumatic intracranial hemorrhage     Time: 0930-1005 PT Time Calculation (min) (ACUTE ONLY): 35 min  Charges:    $Gait Training: 8-22 mins $Therapeutic Activity: 8-22 mins PT General Charges $$ ACUTE PT VISIT: 1 Visit                     Donna Bernard, PT, MPT    Ina Homes 02/02/2024, 10:15 AM

## 2024-02-03 DIAGNOSIS — S065XAA Traumatic subdural hemorrhage with loss of consciousness status unknown, initial encounter: Secondary | ICD-10-CM | POA: Diagnosis not present

## 2024-02-03 LAB — GLUCOSE, CAPILLARY
Glucose-Capillary: 100 mg/dL — ABNORMAL HIGH (ref 70–99)
Glucose-Capillary: 146 mg/dL — ABNORMAL HIGH (ref 70–99)
Glucose-Capillary: 104 mg/dL — ABNORMAL HIGH (ref 70–99)
Glucose-Capillary: 112 mg/dL — ABNORMAL HIGH (ref 70–99)

## 2024-02-03 NOTE — TOC Progression Note (Signed)
 Transition of Care [facility]) - Progression Note    Patient Details  Name: Alison Lopez MRN: [id number] Date of Birth: 1956-10-08  Transition [facility] Care [facility]) CM/SW Contact  Margarito Liner, LCSW Phone Number: 02/03/2024, 10:12 AM  Clinical Narrative:   PASARR obtained: [id number] E. Expires 03/04/24.  Expected Discharge Plan:  (Acute Inpatient rehab v/s SNF) Barriers to Discharge: No Barriers Identified  Expected Discharge Plan and Services   Discharge Planning Services: CM Consult Post Acute Care Choice: IP Rehab, Skilled Nursing Facility Living arrangements for the past 2 months: Single Family Home                                       Social Determinants of Health (SDOH) Interventions SDOH Screenings   Food Insecurity: No Food Insecurity (01/29/2024)  Housing: Low Risk  (01/29/2024)  Transportation Needs: No Transportation Needs (01/29/2024)  Utilities: Not At Risk (01/29/2024)  Depression (PHQ2-9): Low Risk  (01/21/2024)  Social Connections: Socially Isolated (01/29/2024)  Tobacco Use: Medium Risk (01/29/2024)    Readmission Risk Interventions     No data to display

## 2024-02-03 NOTE — Plan of Care (Signed)
   Problem: Activity: Goal: Risk for activity intolerance will decrease Outcome: Progressing   Problem: Pain Managment: Goal: General experience of comfort will improve and/or be controlled Outcome: Progressing   Problem: Safety: Goal: Ability to remain free from injury will improve Outcome: Progressing

## 2024-02-03 NOTE — Progress Notes (Signed)
 [facility]  - [place] [facility]   PATIENT NAME: Alison Lopez    MR#:  [id number]  DATE OF BIRTH:  1956-03-27  SUBJECTIVE:  No complaints, tolerating diet  VITALS:  Blood pressure 119/86, pulse 64, temperature 97.8 F (36.6 C), resp. rate 18, height 4\' 9"  (1.448 m), weight 49.9 kg, SpO2 96%.  PHYSICAL EXAMINATION:   GENERAL:  68 y.o.-year-old patient with no acute distress.  LUNGS: Normal breath sounds bilaterally, no wheezing CARDIOVASCULAR: S1, S2 normal. No murmur   ABDOMEN: Soft, nontender, nondistended.  EXTREMITIES: No  edema b/l.    SKIN: healing surgical incisions on scalp NEUROLOGIC dysarthria, right upper and lower extremity weakness--improving  LABORATORY PANEL:  CBC Recent Labs  Lab 02/01/24 0411  WBC 10.4  HGB 11.4*  HCT 32.7*  PLT 64*    Chemistries  Recent Labs  Lab 01/29/24 1307 01/30/24 0311  NA 140 134*  K 4.2 4.4  CL 107 104  CO2 24 22  GLUCOSE 135* 176*  BUN 14 19  CREATININE 0.99 0.98  CALCIUM 9.1 8.7*  AST 15  --   ALT 9  --   ALKPHOS 39  --   BILITOT 0.5  --      Assessment and Plan   Alison Lopez is a 68 year old female with dementia, history of DVT on Eliquis, chronic thrombocytopenia, B12 deficiency, hypertension, who presents emergency department for chief concerns of altered mental status.  Daughter last saw her on Thursday around 5 PM. Patient reports that on Thursday evening, patient went to bed and urinated on herself and defecated on himself. This is not normal for her.   CT head wo contrast: Was read as positive bilateral mixed density subdural hematomas, somewhat biconvex, 17 mm maximum thickness on the left and 14 mm maximum thickness on the right.   Bilateral subdural hematomas [facility]) --Neurosurgery has been consulted by EDP, Dr. Marcell Barlow is aware --Hold Eliquis -- patient is status post bilateral frontal burr hole for drainage of subdural hematoma with subdural drains placed on each  side -- CT scan of the head to be done tomorrow --3/23-- CT head this morning looks stable postop subdural hematoma evacuation. Perm neurosurgery okay to start PT OT and speech. Okay for DVT prophylaxis. --3/24-- working with PT OT. Will transfer out to MedSurg. Okay with neurosurgery. Bilateral drains from the scalp removed. TOC for discharge planning --3/25-- improving right upper and lower extremity weakness. Seen patient today with physical therapist. Recommending CIR. TOC for discharge planning. Pending insurance auth - f/u neurosurgery pod 14 for staple/suture removal (on or around 4/5)   chronic thrombocytopenia (HCC) --Goal platelet > 75 prior to neurosurgery in the AM --Decadron 40 mg IV, daily, 4 days ordered on admission--completed --plt 52K--1 unit --78K-- 64K -- Dr. Cathie Hoops recommends continue to hold eliquis and follow-up with her as outpatient.  Type 2 diabetes mellitus without complication (HCC) --Insulin SSI with at bedtime coverage ordered --Goal inpatient blood glucose levels 140-180  Antiphospholipid antibody syndrome -- previously failed Coumadin -- recently started seeing hematology and repeat APS labs consistent with the diagnosis. Patient was started back on eliquis as outpatient recently---currently on hold   Hyperlipidemia --Simvastatin    History of DVT of lower extremity and IVC filter placement --Home Eliquis will be held  in setting of bilateral subdural hematoma   Depression/h/o Dementia --Sertraline    COPD (chronic obstructive pulmonary disease) (HCC) --stable --prn nebs    Essential hypertension --bp soft    Procedures: status  post bilateral subdural hematoma evacuation Family communication : daughter Alison Lopez updated telephonically 3/26  consults : neurosurgery CODE STATUS: full DVT Prophylaxis : lovenox Level of care: Med-Surg Status is: Inpatient Remains inpatient appropriate because:pending discharge planning (cir vs snf)  PT OT  recommending acute inpatient rehab/CIR-- TOC for discharge planning   Alison Lopez    [facility]   CC: Primary care physician; Rayetta Humphrey, MD

## 2024-02-03 NOTE — Progress Notes (Signed)
 Physical Therapy Treatment Patient Details Name: Alison Lopez MRN: [id number] DOB: 1956-10-06 Today's Date: 02/03/2024   History of Present Illness 68 y/o female here with AMS, found to have b/l subdural hematomas now s/p B burr holes for subdural hematoma.  PMH: dementia, history of DVT on Eliquis, chronic thrombocytopenia, B12 deficiency, hypertension    PT Comments  Patient received in bed, she is alert and pleasant and agrees to PT session. Patient requires min A for bed mobility. Transfers with min A and ambulated into bathroom and back with RW and hand held Assist. She had difficulty managing RW due to R side neglect. Performed better with hand held assist. Patient demonstrates poor standing balance with posterior and right sided leaning. Her vision also seems to be moderately affected. Patient will continue to benefit from skilled PT to improve functional independence and safety with mobility.       If plan is discharge home, recommend the following: Assistance with cooking/housework;Assistance with feeding;Assist for transportation;Help with stairs or ramp for entrance;Supervision due to cognitive status;Direct supervision/assist for medications management;Direct supervision/assist for financial management;A little help with walking and/or transfers;A little help with bathing/dressing/bathroom   Can travel by private vehicle     Yes  Equipment Recommendations  Other (comment) (TBD next venue)    Recommendations for Other Services Rehab consult     Precautions / Restrictions Precautions Precautions: Fall Recall of Precautions/Restrictions: Impaired Restrictions Weight Bearing Restrictions Per Provider Order: No     Mobility  Bed Mobility Overal bed mobility: Needs Assistance Bed Mobility: Supine to Sit     Supine to sit: Min assist     General bed mobility comments: assistance needed for her right side and cues    Transfers Overall transfer level: Needs  assistance Equipment used: Rolling walker (2 wheels), 1 person hand held assist Transfers: Sit to/from Stand Sit to Stand: Min assist           General transfer comment: assist needed to grab walker with right UE. Required min/mod A to negotiate walker due to decreased awareness of R side and poor vision    Ambulation/Gait Ambulation/Gait assistance: Min assist Gait Distance (Feet): 25 Feet Assistive device: Rolling walker (2 wheels) Gait Pattern/deviations: Step-through pattern, Decreased step length - right, Decreased step length - left, Drifts right/left Gait velocity: decreased     General Gait Details: patient able to advance R LE well this session when walking forward. She had difficulty side stepping when needed. Continues to have R neglect and poor awareness. Poor balance during mobiity requiring min A at all times.   Stairs             Wheelchair Mobility     Tilt Bed    Modified Rankin (Stroke Patients Only)       Balance Overall balance assessment: Needs assistance Sitting-balance support: Feet supported Sitting balance-Leahy Scale: Fair   Postural control: Right lateral lean Standing balance support: Single extremity supported, Bilateral upper extremity supported, During functional activity Standing balance-Leahy Scale: Poor Standing balance comment: Requires hands on assist for standing mobility. Poor balance.                            Communication Communication Communication: Impaired Factors Affecting Communication: Difficulty expressing self  Cognition Arousal: Alert Behavior During Therapy: WFL for tasks assessed/performed   PT - Cognitive impairments: Awareness, Attention, Problem solving, Sequencing, Safety/Judgement  PT - Cognition Comments: R side inattention, poor vision Following commands: Impaired Following commands impaired: Follows one step commands with increased time, Follows one  step commands inconsistently    Cueing Cueing Techniques: Verbal cues, Gestural cues, Tactile cues, Visual cues  Exercises      General Comments        Pertinent Vitals/Pain Pain Assessment Pain Assessment: No/denies pain Pain Intervention(s): Monitored during session    Home Living                          Prior Function            PT Goals (current goals can now be found in the care plan section) Acute Rehab PT Goals Patient Stated Goal: to go home with her daughter PT Goal Formulation: With patient Time For Goal Achievement: 02/13/24 Potential to Achieve Goals: Fair Progress towards PT goals: Progressing toward goals    Frequency    Min 2X/week      PT Plan      Co-evaluation PT/OT/SLP Co-Evaluation/Treatment: Yes Reason for Co-Treatment: To address functional/ADL transfers;For patient/therapist safety PT goals addressed during session: Mobility/safety with mobility;Balance        AM-PAC PT "6 Clicks" Mobility   Outcome Measure  Help needed turning from your back to your side while in a flat bed without using bedrails?: A Little Help needed moving from lying on your back to sitting on the side of a flat bed without using bedrails?: A Little Help needed moving to and from a bed to a chair (including a wheelchair)?: A Little Help needed standing up from a chair using your arms (e.g., wheelchair or bedside chair)?: A Little Help needed to walk in hospital room?: A Little Help needed climbing 3-5 steps with a railing? : A Lot 6 Click Score: 17    End of Session   Activity Tolerance: Patient tolerated treatment well Patient left: in chair;with call bell/phone within reach;with chair alarm set Nurse Communication: Mobility status PT Visit Diagnosis: Muscle weakness (generalized) (M62.81);Difficulty in walking, not elsewhere classified (R26.2);Unsteadiness on feet (R26.81);Other symptoms and signs involving the nervous system (R29.898);Hemiplegia  and hemiparesis Hemiplegia - Right/Left: Right Hemiplegia - dominant/non-dominant: Dominant Hemiplegia - caused by: Nontraumatic intracerebral hemorrhage     Time: 1110-1135 PT Time Calculation (min) (ACUTE ONLY): 25 min  Charges:    $Gait Training: 8-22 mins PT General Charges $$ ACUTE PT VISIT: 1 Visit                     Adriel Desrosier, PT, GCS 02/03/24,1:14 PM

## 2024-02-03 NOTE — Progress Notes (Addendum)
 Neurosurgery Progress Note  History: Alison Lopez is here for bilateral subdural hematoma  POD4: doing well this morning. Expresses fatigue but is overall improving  POD3: Denies headaches. No acute events overnight. POD2: NAEO POD1: Brighter.  Intermittently not cooperating with moving RUE and RLE. No headaches today  Physical Exam: Vitals:   02/03/24 0418 02/03/24 0758  BP:  (!) 146/79  Pulse: (!) 58 (!) 54  Resp:  18  Temp:  98.1 F (36.7 C)  SpO2: 98% 100%    AA O to self, place and situation but not time.   Strength:5/5 throughout LUE and LLE Is at least 3/5 throughout RUE and RLE  Incisions C/D/I. Staples and sutures remain in place.  Data:  Other tests/results: CT reviewed - subdural hematomas much smaller.  Assessment/Plan:  Alison Lopez is stable after B burr holes for subdural hematoma  - mobilize - pain control - DVT prophylaxis  - PTOT; activity as tolerated - Dressings removed 3/25. Staples can be removed on POD 14. - neurosurgery will follow up outpatient. Appointment is in patients d/c paperwork. Please feel free to reengage with any questions or concerns.   Manning Charity PA-C Department of Neurosurgery

## 2024-02-03 NOTE — Plan of Care (Signed)
  Problem: Activity: Goal: Risk for activity intolerance will decrease Outcome: Progressing   Problem: Pain Managment: Goal: General experience of comfort will improve and/or be controlled Outcome: Progressing   Problem: Safety: Goal: Ability to remain free from injury will improve Outcome: Progressing   Problem: Skin Integrity: Goal: Risk for impaired skin integrity will decrease Outcome: Progressing

## 2024-02-03 NOTE — Progress Notes (Signed)
 Occupational Therapy Treatment Patient Details Name: Alison Lopez MRN: [id number] DOB: Mar 03, 1956 Today's Date: 02/03/2024   History of present illness 68 y/o female here with AMS, found to have b/l subdural hematomas now s/p B burr holes for subdural hematoma.  PMH: dementia, history of DVT on Eliquis, chronic thrombocytopenia, B12 deficiency, hypertension   OT comments  Pt seen for OT/PT treatment on this date. Upon arrival to room pt sitting up in bed, agreeable to tx. Pt completed bed mobility with MINA for LE. Pt requires HHA during OOB mobility due to R sided neglect. Pt amb to BR with HHA, demonstrates improved STS from regular toilet height, MAX A for peri care required. Pt completed standing face washing at sink level and seated oral care to additional support. Verbal and visual cues required for task completion and sequencing during oral care. Pt making good progress toward goals, will continue to follow POC. Discharge recommendation remains appropriate.        If plan is discharge home, recommend the following:  A lot of help with bathing/dressing/bathroom;A lot of help with walking and/or transfers;Supervision due to cognitive status;Assist for transportation;Direct supervision/assist for medications management;Assistance with cooking/housework;Help with stairs or ramp for entrance;Direct supervision/assist for financial management;Assistance with feeding   Equipment Recommendations  Other (comment)    Recommendations for Other Services      Precautions / Restrictions Precautions Precautions: Fall Recall of Precautions/Restrictions: Impaired Restrictions Weight Bearing Restrictions Per Provider Order: No       Mobility Bed Mobility Overal bed mobility: Needs Assistance Bed Mobility: Supine to Sit Rolling: Min assist   Supine to sit: Min assist Sit to supine: Min assist   General bed mobility comments: Pt requires assistance for her right side and cues     Transfers Overall transfer level: Needs assistance Equipment used: Rolling walker (2 wheels), 1 person hand held assist Transfers: Sit to/from Stand Sit to Stand: Min assist           General transfer comment: Pt requires physical assistance to grab walker with right UE. Required min/mod A to negotiate walker due to decreased awareness of R side and poor vision during mobility.     Balance Overall balance assessment: Needs assistance Sitting-balance support: Feet supported Sitting balance-Leahy Scale: Fair   Postural control: Right lateral lean Standing balance support: Single extremity supported, Bilateral upper extremity supported, During functional activity Standing balance-Leahy Scale: Poor Standing balance comment: Requires hands on assist for standing mobility. Poor balance.                           ADL either performed or assessed with clinical judgement   ADL Overall ADL's : Needs assistance/impaired Eating/Feeding: Set up   Grooming: Wash/dry face;Brushing hair;Standing;Oral care                   Toilet Transfer: Regular Toilet;Ambulation;Cueing for safety;Cueing for sequencing;+2 for safety/equipment (HHA)   Toileting- Clothing Manipulation and Hygiene: Maximal assistance;Sit to/from stand       Functional mobility during ADLs: +2 for safety/equipment (HHA) General ADL Comments: Pt completed standing grooming tasks at sink level then sat to complete oral care for additional stability. Pt benefited from moderate verbal and visual cues for tasks sequence.    Extremity/Trunk Assessment              Diplomatic Services operational officer Communication Communication:  Impaired Factors Affecting Communication: Difficulty expressing self   Cognition Arousal: Alert Behavior During Therapy: WFL for tasks assessed/performed Cognition: Cognition impaired, History of cognitive impairments                                Following commands: Impaired Following commands impaired: Follows one step commands with increased time, Follows one step commands inconsistently      Cueing   Cueing Techniques: Verbal cues, Gestural cues, Tactile cues, Visual cues  Exercises Exercises: Other exercises Other Exercises Other Exercises: Edu: DME management, scanning cues due to R sided neglect    Shoulder Instructions       General Comments Pt very eager to work with therapy and get better    Pertinent Vitals/ Pain       Pain Assessment Pain Assessment: No/denies pain Pain Intervention(s): Monitored during session, Repositioned  Home Living                                          Prior Functioning/Environment              Frequency  Min 3X/week        Progress Toward Goals  OT Goals(current goals can now be found in the care plan section)  Progress towards OT goals: Progressing toward goals  Acute Rehab OT Goals Patient Stated Goal: Stand OT Goal Formulation: With patient Time For Goal Achievement: 02/15/24 Potential to Achieve Goals: Good  Plan      Co-evaluation    PT/OT/SLP Co-Evaluation/Treatment: Yes Reason for Co-Treatment: To address functional/ADL transfers;For patient/therapist safety PT goals addressed during session: Mobility/safety with mobility;Balance OT goals addressed during session: ADL's and self-care      AM-PAC OT "6 Clicks" Daily Activity     Outcome Measure   Help from another person eating meals?: A Lot Help from another person taking care of personal grooming?: A Lot Help from another person toileting, which includes using toliet, bedpan, or urinal?: A Lot Help from another person bathing (including washing, rinsing, drying)?: A Lot Help from another person to put on and taking off regular upper body clothing?: A Lot Help from another person to put on and taking off regular lower body clothing?: A Lot 6 Click Score: 12     End of Session Equipment Utilized During Treatment: Gait belt  OT Visit Diagnosis: Other abnormalities of gait and mobility (R26.89);Muscle weakness (generalized) (M62.81);Hemiplegia and hemiparesis Hemiplegia - Right/Left: Right   Activity Tolerance Patient tolerated treatment well   Patient Left in chair;with call bell/phone within reach;with chair alarm set   Nurse Communication Mobility status        Time: 1110-1135 OT Time Calculation (min): 25 min  Charges: OT General Charges $OT Visit: 1 Visit OT Treatments $Self Care/Home Management : 8-22 mins  Glenard Haring M.S. OTR/L  02/03/24, 1:33 PM

## 2024-02-03 NOTE — Telephone Encounter (Signed)
 Patient is still admitted.

## 2024-02-03 NOTE — Progress Notes (Signed)
 Inpatient Rehab Admissions Coordinator:   Spoke to pt's daughter regarding CIR and caregiver request.  She has been working on pulling together 24/7 supervision for discharge and will take FMLA if needed.  Auth for CIR still pending with [employer].  I will continue to follow.   Estill Dooms, PT, DPT Admissions Coordinator [phone] 02/03/24  3:28 PM

## 2024-02-04 ENCOUNTER — Encounter: Payer: Self-pay | Admitting: *Deleted

## 2024-02-04 DIAGNOSIS — S065XAA Traumatic subdural hemorrhage with loss of consciousness status unknown, initial encounter: Secondary | ICD-10-CM | POA: Diagnosis not present

## 2024-02-04 LAB — GLUCOSE, CAPILLARY
Glucose-Capillary: 100 mg/dL — ABNORMAL HIGH (ref 70–99)
Glucose-Capillary: 155 mg/dL — ABNORMAL HIGH (ref 70–99)
Glucose-Capillary: 104 mg/dL — ABNORMAL HIGH (ref 70–99)
Glucose-Capillary: 95 mg/dL (ref 70–99)

## 2024-02-04 MED ORDER — ORAL CARE MOUTH RINSE: 15.0000 mL | OROMUCOSAL | Status: DC | PRN

## 2024-02-04 MED ORDER — MAGNESIUM HYDROXIDE 400 MG/5ML PO SUSP
30.0000 mL | Freq: Once | ORAL | Status: AC
  Administered 2024-02-04: 30 mL via ORAL
  Filled 2024-02-04: qty 30

## 2024-02-04 MED ORDER — SENNA 8.6 MG PO TABS
1.0000 | ORAL_TABLET | Freq: Every day | ORAL | Status: DC
  Administered 2024-02-04 – 2024-02-08 (×4): 8.6 mg via ORAL
  Filled 2024-02-04 (×4): qty 1

## 2024-02-04 MED ORDER — POLYETHYLENE GLYCOL 3350 17 G PO PACK
17.0000 g | PACK | Freq: Every day | ORAL | Status: DC
  Administered 2024-02-04 – 2024-02-06 (×3): 17 g via ORAL
  Filled 2024-02-04 (×3): qty 1

## 2024-02-04 NOTE — Progress Notes (Signed)
 Physical Therapy Treatment Patient Details Name: Alison Lopez MRN: [id number] DOB: Dec 04, 1955 Today's Date: 02/04/2024   History of Present Illness 68 y/o female here with AMS, found to have b/l subdural hematomas now s/p B burr holes for subdural hematoma.  PMH: dementia, history of DVT on Eliquis, chronic thrombocytopenia, B12 deficiency, hypertension    PT Comments  Patient received in bed, slid down. She is alert, pleasant and motivated to walk. Patient requires min A to advance Right side when moving in bed and min hand held assist for ambulation of 200 feet. She needs a lot of cues for safety during mobility and direction due to poor vision. Patient requires assistance with self care. She will continue to benefit from skilled PT to improve mobility and independence. She is making great progress.      If plan is discharge home, recommend the following: Assistance with cooking/housework;Assistance with feeding;Assist for transportation;Help with stairs or ramp for entrance;Supervision due to cognitive status;Direct supervision/assist for medications management;Direct supervision/assist for financial management;A little help with walking and/or transfers;A little help with bathing/dressing/bathroom   Can travel by private vehicle     Yes  Equipment Recommendations  None recommended by PT;Other (comment) (TBD next venue)    Recommendations for Other Services Rehab consult     Precautions / Restrictions Precautions Precautions: Fall Recall of Precautions/Restrictions: Impaired Restrictions Weight Bearing Restrictions Per Provider Order: No     Mobility  Bed Mobility Overal bed mobility: Needs Assistance Bed Mobility: Supine to Sit     Supine to sit: Min assist     General bed mobility comments: Pt requires assistance for her right side and cues    Transfers Overall transfer level: Needs assistance Equipment used: 1 person hand held assist Transfers: Sit to/from  Stand Sit to Stand: Min assist           General transfer comment: L hand held assist and steadying for balance    Ambulation/Gait Ambulation/Gait assistance: Min assist Gait Distance (Feet): 200 Feet Assistive device: 1 person hand held assist Gait Pattern/deviations: Step-through pattern, Decreased step length - right, Decreased step length - left, Narrow base of support, Decreased stride length Gait velocity: decreased     General Gait Details: patient doing well with ambulation. Requires cues for direction due to poor vision. Follows 1 step direction with increased time and inconsistently. Able to increase distance significantly this session. Patient requires heavy cues for safety, ADLs, ambulation. Poor vision and cognition.   Stairs             Wheelchair Mobility     Tilt Bed    Modified Rankin (Stroke Patients Only)       Balance Overall balance assessment: Needs assistance Sitting-balance support: Feet supported Sitting balance-Leahy Scale: Fair     Standing balance support: Single extremity supported, During functional activity Standing balance-Leahy Scale: Fair Standing balance comment: Requires hands on assist for standing mobility. Poor balance.                            Communication Communication Communication: Impaired Factors Affecting Communication: Reduced clarity of speech  Cognition Arousal: Alert Behavior During Therapy: WFL for tasks assessed/performed   PT - Cognitive impairments: Awareness, Attention, Problem solving, Sequencing, Safety/Judgement, Memory                       PT - Cognition Comments: R side inattention, poor vision Following commands: Intact Following  commands impaired: Follows one step commands with increased time, Follows one step commands inconsistently    Cueing Cueing Techniques: Verbal cues, Gestural cues, Tactile cues, Visual cues  Exercises      General Comments         Pertinent Vitals/Pain Pain Assessment Pain Assessment: No/denies pain    Home Living Family/patient expects to be discharged to:: Skilled nursing facility Living Arrangements: Children Available Help at Discharge: Family;Available PRN/intermittently Type of Home: House Home Access: Stairs to enter   [employer] of Steps: 4            Prior Function            PT Goals (current goals can now be found in the care plan section) Acute Rehab PT Goals Patient Stated Goal: to improve PT Goal Formulation: With patient Time For Goal Achievement: 02/13/24 Potential to Achieve Goals: Good Progress towards PT goals: Progressing toward goals    Frequency    Min 2X/week      PT Plan      Co-evaluation              AM-PAC PT "6 Clicks" Mobility   Outcome Measure  Help needed turning from your back to your side while in a flat bed without using bedrails?: A Little Help needed moving from lying on your back to sitting on the side of a flat bed without using bedrails?: A Little Help needed moving to and from a bed to a chair (including a wheelchair)?: A Little Help needed standing up from a chair using your arms (e.g., wheelchair or bedside chair)?: A Little Help needed to walk in hospital room?: A Little Help needed climbing 3-5 steps with a railing? : A Lot 6 Click Score: 17    End of Session Equipment Utilized During Treatment: Gait belt Activity Tolerance: Patient tolerated treatment well Patient left: in chair;with call bell/phone within reach;with chair alarm set Nurse Communication: Mobility status PT Visit Diagnosis: Muscle weakness (generalized) (M62.81);Difficulty in walking, not elsewhere classified (R26.2);Unsteadiness on feet (R26.81);Other symptoms and signs involving the nervous system (R29.898);Hemiplegia and hemiparesis;Other abnormalities of gait and mobility (R26.89) Hemiplegia - Right/Left: Right Hemiplegia - dominant/non-dominant:  Dominant Hemiplegia - caused by: Nontraumatic intracerebral hemorrhage     Time: 1325-1343 PT Time Calculation (min) (ACUTE ONLY): 18 min  Charges:    $Gait Training: 8-22 mins PT General Charges $$ ACUTE PT VISIT: 1 Visit                     Yousif Edelson, PT, GCS 02/04/24,1:54 PM

## 2024-02-04 NOTE — Progress Notes (Signed)
 Speech Language Pathology Treatment: Cognitive-Linquistic  Patient Details Name: Alison Lopez MRN: [id number] DOB: 05-31-1956 Today's Date: 02/04/2024 Time: 0845-0900 SLP Time Calculation (min) (ACUTE ONLY): 15 min  Assessment / Plan / Recommendation Clinical Impression  Pt seen for follow up skilled speech and language session, targeting dynamic assessment and education. Further assessment of language revealing challenge with visual processing despite use of glasses, limiting confrontational naming and reading. Automatic verbal expression and responsive naming intact at independent level. Increased articulation imprecision noted with connected speech, with potential for verbal apraxia vs dysarthria. Min cues provided for retrial with slow, precise production. Intelligibility maintained t/o. Pt with emerging awareness for challenge with "getting words out right" and weakness. Education shared regarding follow up SLP services upon discharge from acute services. Pt reported understanding. Presentation is grossly comparable to assessment on 3/24. Recommend follow up SLP services at next level of care- no further acute services indicated.    HPI HPI: Per MD Progress note: "Alison Lopez is a 68 year old female with dementia, history of DVT on Eliquis, chronic thrombocytopenia, B12 deficiency, hypertension, who presents emergency department for chief concerns of altered mental status. Daughter last saw her on Thursday around 5 PM. Patient reports that on Thursday evening, patient went to bed and urinated on herself and defecated on himself. This is not normal for her." Pt is now s/p  bilateral frontal burr hole for drainage of subdural hematoma with subdural drains placed on each side. Head CT 3/23: Postoperative substantial decrease in bilateral subdural  hematomas with bilateral subdural drains in place. Residual blood up  to 11 mm on the Left, 4-5 mm on the Right.  2. Significantly regressed  intracranial mass effect. Residual  rightward midline shift now 1-2 mm.  3. No new intracranial abnormality. Chronic ischemic disease.      SLP Plan  All goals met (recommend follow up at next level of care)      Recommendations for follow up therapy are one component of a multi-disciplinary discharge planning process, led by the attending physician.  Recommendations may be updated based on patient status, additional functional criteria and insurance authorization.    Recommendations                    Oral care BID;Staff/trained caregiver to provide oral care   Frequent or constant Supervision/Assistance Dysarthria and anarthria (R47.1);Apraxia (R48.2);Cognitive communication deficit (R41.841)     All goals met (recommend follow up at next level of care)    Alison Cheria Sadiq Clapp, MS, CCC-SLP Speech Language Pathologist Rehab Services; [facility] [phone] (ascom)   Alison Lopez  02/04/2024, 9:42 AM

## 2024-02-04 NOTE — Progress Notes (Signed)
 [facility] IP rehab admissions - I spoke with patient's daughter and she would like to appeal the insurance denial.  I have faxed the appeal information to [facility] and will await their response.  This could take 72 hours for [place] to get a response.  [phone]

## 2024-02-04 NOTE — TOC Progression Note (Addendum)
 Transition of Care [facility]) - Progression Note    Patient Details  Name: Alison Lopez MRN: [id number] Date of Birth: 1956-07-21  [facility] Care [facility]) CM/SW Contact  Margarito Liner, LCSW Phone Number: 02/04/2024, 12:01 PM  Clinical Narrative: CSW emailed bed offers with link to CMS website to daughter.    1:05 pm: Daughter asked CSW to check with [facility].  Expected Discharge Plan:  (Acute Inpatient rehab v/s SNF) Barriers to Discharge: No Barriers Identified  Expected Discharge Plan and Services   Discharge Planning Services: CM Consult Post Acute Care Choice: IP Rehab, Skilled Nursing Facility Living arrangements for the past 2 months: Single Family Home                                       Social Determinants of Health (SDOH) Interventions SDOH Screenings   Food Insecurity: No Food Insecurity (01/29/2024)  Housing: Low Risk  (01/29/2024)  Transportation Needs: No Transportation Needs (01/29/2024)  Utilities: Not At Risk (01/29/2024)  Depression (PHQ2-9): Low Risk  (01/21/2024)  Social Connections: Socially Isolated (01/29/2024)  Tobacco Use: Medium Risk (01/29/2024)    Readmission Risk Interventions     No data to display

## 2024-02-04 NOTE — Plan of Care (Signed)
  Problem: Health Behavior/Discharge Planning: Goal: Ability to identify and utilize available resources and services will improve Outcome: Progressing Goal: Ability to manage health-related needs will improve Outcome: Progressing   Problem: Skin Integrity: Goal: Risk for impaired skin integrity will decrease Outcome: Progressing   Problem: Activity: Goal: Risk for activity intolerance will decrease Outcome: Progressing   Problem: Coping: Goal: Level of anxiety will decrease Outcome: Progressing   Problem: Pain Managment: Goal: General experience of comfort will improve and/or be controlled Outcome: Progressing   Problem: Safety: Goal: Ability to remain free from injury will improve Outcome: Progressing   Problem: Skin Integrity: Goal: Risk for impaired skin integrity will decrease Outcome: Progressing

## 2024-02-04 NOTE — Progress Notes (Signed)
 [facility]  - [place] at [facility]   PATIENT NAME: Alison Lopez    MR#:  [id number]  DATE OF BIRTH:  10-14-1956  SUBJECTIVE:  No complaints, tolerating diet. Ambulated with PT today. No BM  VITALS:  Blood pressure 124/70, pulse 64, temperature 98.6 F (37 C), temperature source Oral, resp. rate 16, height 4\' 9"  (1.448 m), weight 49.9 kg, SpO2 98%.  PHYSICAL EXAMINATION:   GENERAL:  68 y.o.-year-old patient with no acute distress.  LUNGS: Normal breath sounds bilaterally, no wheezing CARDIOVASCULAR: S1, S2 normal. No murmur   ABDOMEN: Soft, nontender, nondistended.  EXTREMITIES: No  edema b/l.    SKIN: healing surgical incisions on scalp NEUROLOGIC dysarthria, right upper and lower extremity weakness--improving  LABORATORY PANEL:  CBC Recent Labs  Lab 02/01/24 0411  WBC 10.4  HGB 11.4*  HCT 32.7*  PLT 64*    Chemistries  Recent Labs  Lab 01/29/24 1307 01/30/24 0311  NA 140 134*  K 4.2 4.4  CL 107 104  CO2 24 22  GLUCOSE 135* 176*  BUN 14 19  CREATININE 0.99 0.98  CALCIUM 9.1 8.7*  AST 15  --   ALT 9  --   ALKPHOS 39  --   BILITOT 0.5  --      Assessment and Plan   Alison Lopez is a 68 year old female with dementia, history of DVT on Eliquis, chronic thrombocytopenia, B12 deficiency, hypertension, who presents emergency department for chief concerns of altered mental status.  Daughter last saw her on Thursday around 5 PM. Patient reports that on Thursday evening, patient went to bed and urinated on herself and defecated on himself. This is not normal for her.   CT head wo contrast: Was read as positive bilateral mixed density subdural hematomas, somewhat biconvex, 17 mm maximum thickness on the left and 14 mm maximum thickness on the right.   Bilateral subdural hematomas [facility]) --Neurosurgery has been consulted by EDP, Dr. Marcell Barlow is aware --Hold Eliquis -- patient is status post bilateral frontal burr hole for drainage of  subdural hematoma with subdural drains placed on each side -- CT scan of the head to be done tomorrow --3/23-- CT head this morning looks stable postop subdural hematoma evacuation. Perm neurosurgery okay to start PT OT and speech. Okay for DVT prophylaxis. --3/24-- working with PT OT. Will transfer out to MedSurg. Okay with neurosurgery. Bilateral drains from the scalp removed. TOC for discharge planning --3/25-- improving right upper and lower extremity weakness. Seen patient today with physical therapist. Recommending CIR. TOC for discharge planning. Pending insurance auth - f/u neurosurgery pod 14 for staple/suture removal (on or around 4/5)   chronic thrombocytopenia (HCC) --Goal platelet > 75 prior to neurosurgery in the AM --Decadron 40 mg IV, daily, 4 days ordered on admission--completed --plt 52K--1 unit --78K-- 64K -- Dr. Cathie Hoops recommends continue to hold eliquis and follow-up with her as outpatient.  Type 2 diabetes mellitus without complication (HCC) --Insulin SSI with at bedtime coverage ordered --Goal inpatient blood glucose levels 140-180  Antiphospholipid antibody syndrome -- previously failed Coumadin -- recently started seeing hematology and repeat APS labs consistent with the diagnosis. 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 --Home Eliquis will be held  in setting of bilateral subdural hematoma   Depression/h/o  Dementia --Sertraline    COPD (chronic obstructive pulmonary disease) (HCC) --stable --prn nebs    Essential hypertension --bp soft    Procedures: status post bilateral subdural hematoma evacuation Family communication : daughter Alison Lopez updated telephonically 3/27  consults : neurosurgery CODE STATUS: full DVT  Prophylaxis : lovenox Level of care: Med-Surg Status is: Inpatient Remains inpatient appropriate because:pending discharge planning (cir vs snf), auth denied for cir, daughter is appealing, toc consulted for snf  PT OT recommending acute inpatient rehab/CIR-- TOC for discharge planning   Silvano Bilis M.D    [facility]   CC: Primary care physician; Alison Humphrey, MD

## 2024-02-05 DIAGNOSIS — S065XAA Traumatic subdural hemorrhage with loss of consciousness status unknown, initial encounter: Secondary | ICD-10-CM | POA: Diagnosis not present

## 2024-02-05 LAB — GLUCOSE, CAPILLARY
Glucose-Capillary: 97 mg/dL (ref 70–99)
Glucose-Capillary: 148 mg/dL — ABNORMAL HIGH (ref 70–99)
Glucose-Capillary: 103 mg/dL — ABNORMAL HIGH (ref 70–99)
Glucose-Capillary: 94 mg/dL (ref 70–99)

## 2024-02-05 NOTE — Progress Notes (Signed)
 OT Cancellation Note  Patient Details Name: Alison Lopez MRN: [id number] DOB: 1956-10-27   Cancelled Treatment:    Reason Eval/Treat Not Completed: Patient at procedure or test/ unavailable. Pt out of the room upon attempt. Will re-attempt at later date/time as able.   Arman Filter., MPH, MS, OTR/L ascom [phone] 02/05/24, 3:56 PM

## 2024-02-05 NOTE — Progress Notes (Addendum)
 Mobility Specialist - Progress Note   02/05/24 1008  Mobility  Activity Ambulated with assistance in hallway  Level of Assistance Minimal assist, patient does 75% or more  Assistive Device Front wheel walker  Distance Ambulated (ft) 160 ft  Range of Motion/Exercises Active  Activity Response Tolerated well  Mobility Referral Yes  Mobility Specialist Start Time (ACUTE ONLY) [id number]  Mobility Specialist Stop Time (ACUTE ONLY) 1010  Mobility Specialist Time Calculation (min) (ACUTE ONLY) 34 min    Pt resting in bed on RA upon entry. Pt very pleasant and agreeable to participate in ambulation. Pt STS and ambulates to hallway around NS for 1 lap MinA with RW. Pt can see shadows and maintains steady gait with verbal and manual directional cuing (physically guiding the walker). Pt returned to recliner and left with needs in reach. Lunch ordered and patient ate a few bites of her food and coffee with assistance. Chair alarm activated.   Johnathan Hausen Mobility Specialist 02/05/24, 10:15 AM

## 2024-02-05 NOTE — Progress Notes (Signed)
 Mobility Specialist - Progress Note    02/05/24 1545  Mobility  Activity Ambulated with assistance in hallway;Stood at bedside;Repositioned in chair  Level of Assistance Minimal assist, patient does 75% or more  Assistive Device Front wheel walker  Distance Ambulated (ft) 600 ft  Range of Motion/Exercises Active  Activity Response Tolerated well  Mobility Referral Yes  Mobility visit 1 Mobility   Pt resting In recliner on RA upon entry. Pt STS and ambulates to hallway MinA HHA to maintain stability (must have hands on to avoid fall). Pt rolled out to outside area of medical mall. Pt returned to recliner and left with needs in reach. Bed alarm activated.   Johnathan Hausen Mobility Specialist 02/05/24, 3:48 PM

## 2024-02-05 NOTE — Care Management Important Message (Signed)
 Important Message  Patient Details  Name: Alison Lopez MRN: [id number] Date of Birth: 1956-09-03   Important Message Given:  Yes - Medicare IM     Bernadette Hoit 02/05/2024, 10:28 AM

## 2024-02-05 NOTE — TOC Progression Note (Signed)
 Transition of Care [facility]) - Progression Note    Patient Details  Name: Alison Lopez MRN: [id number] Date of Birth: 1956/07/13  Transition of Care [facility]) CM/SW Contact  Liliana Cline, LCSW Phone Number: 02/05/2024, 4:02 PM  Clinical Narrative:    Awaiting CIR appeal outcome.   Expected Discharge Plan:  (Acute Inpatient rehab v/s SNF) Barriers to Discharge: No Barriers Identified  Expected Discharge Plan and Services   Discharge Planning Services: CM Consult Post Acute Care Choice: IP Rehab, Skilled Nursing Facility Living arrangements for the past 2 months: Single Family Home                                       Social Determinants of Health (SDOH) Interventions SDOH Screenings   Food Insecurity: No Food Insecurity (01/29/2024)  Housing: Low Risk  (01/29/2024)  Transportation Needs: No Transportation Needs (01/29/2024)  Utilities: Not At Risk (01/29/2024)  Depression (PHQ2-9): Low Risk  (01/21/2024)  Social Connections: Socially Isolated (01/29/2024)  Tobacco Use: Medium Risk (01/29/2024)    Readmission Risk Interventions     No data to display

## 2024-02-05 NOTE — Progress Notes (Signed)
 [facility]  - [place] at [facility]   PATIENT NAME: Alison Lopez    MR#:  [id number]  DATE OF BIRTH:  14-Aug-1956  SUBJECTIVE:  No complaints, tolerating diet. No family at bedside. Patient tolerating PT very well.  VITALS:  Blood pressure 138/63, pulse (!) 54, temperature 98.1 F (36.7 C), temperature source Oral, resp. rate 16, height 4\' 9"  (1.448 m), weight 49.9 kg, SpO2 100%.  PHYSICAL EXAMINATION:   GENERAL:  68 y.o.-year-old patient with no acute distress.  LUNGS: Normal breath sounds bilaterally, no wheezing CARDIOVASCULAR: S1, S2 normal. No murmur   ABDOMEN: Soft, nontender, nondistended.  EXTREMITIES: No  edema b/l.    SKIN: healing surgical incisions on scalp NEUROLOGIC dysarthria, right upper and lower extremity weakness--improving  LABORATORY PANEL:  CBC Recent Labs  Lab 02/01/24 0411  WBC 10.4  HGB 11.4*  HCT 32.7*  PLT 64*    Chemistries  Recent Labs  Lab 01/30/24 0311  NA 134*  K 4.4  CL 104  CO2 22  GLUCOSE 176*  BUN 19  CREATININE 0.98  CALCIUM 8.7*     Assessment and Plan   Alison Lopez is a 68 year old female with dementia, history of DVT on Eliquis, chronic thrombocytopenia, B12 deficiency, hypertension, who presents emergency department for chief concerns of altered mental status.  Daughter last saw her on Thursday around 5 PM. Patient reports that on Thursday evening, patient went to bed and urinated on herself and defecated on himself. This is not normal for her.   CT head wo contrast: Was read as positive bilateral mixed density subdural hematomas, somewhat biconvex, 17 mm maximum thickness on the left and 14 mm maximum thickness on the right.   Bilateral subdural hematomas [facility]) --Neurosurgery has been consulted by EDP, Dr. Marcell Barlow is aware --Hold Eliquis -- patient is status post bilateral frontal burr hole for drainage of subdural hematoma with subdural drains placed on each side -- CT scan of the head to  be done tomorrow --3/23-- CT head this morning looks stable postop subdural hematoma evacuation. Perm neurosurgery okay to start PT OT and speech. Okay for DVT prophylaxis. --3/24-- working with PT OT. Will transfer out to MedSurg. Okay with neurosurgery. Bilateral drains from the scalp removed. TOC for discharge planning --3/25-- improving right upper and lower extremity weakness. Seen patient today with physical therapist. Recommending CIR. TOC for discharge planning. Pending insurance auth - f/u neurosurgery pod 14 for staple/suture removal (on or around 4/5)   chronic thrombocytopenia (HCC) --Goal platelet > 75 prior to neurosurgery in the AM --Decadron 40 mg IV, daily, 4 days ordered on admission--completed --plt 52K--1 unit --78K-- 64K -- Dr. Cathie Hoops recommends continue to hold eliquis and follow-up with her as outpatient.  Type 2 diabetes mellitus without complication (HCC) --Insulin SSI with at bedtime coverage ordered --Goal inpatient blood glucose levels 140-180  Antiphospholipid antibody syndrome -- previously failed Coumadin -- recently started seeing hematology and repeat APS labs consistent with the diagnosis. 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 --Home Eliquis will be held  in setting of bilateral subdural hematoma   Depression/h/o Dementia --Sertraline    COPD (chronic obstructive pulmonary disease) (HCC) --stable --prn nebs    Essential hypertension --bp soft    Procedures: status post bilateral subdural hematoma  evacuation Family communication : daughter amber updated telephonically 3/27  consults : neurosurgery CODE STATUS: full DVT Prophylaxis : lovenox Level of care: Med-Surg Status is: Inpatient Remains inpatient  appropriate because:pending discharge planning (cir vs snf), auth denied for cir, daughter is appealing, toc consulted for snf  PT OT recommending acute inpatient rehab/CIR--Awaiting CIR denial appeal done by dter   Enedina Finner M.D    [facility]   CC: Primary care physician; Rayetta Humphrey, MD

## 2024-02-05 NOTE — Consult Note (Signed)
 Physical Medicine and Rehabilitation Consult Reason for Consult: Bilateral subdural hematomas Referring Physician: Enedina Finner, MD   HPI: Alison Lopez is a 68 y.o. female who presented here with AMS and was found to have bilateral subdural hematomas now s/p Burr holes. PMH inludes dementia, history of DVT on Eliquis, chironic thrombocytopenia, B12 deficiency, HTN. Physical Medicine & Rehabilitation was consulted to assess candidacy for CIR.     ROS +impaired ambulation and ADLs Past Medical History:  Diagnosis Date   Anxiety    Arthritis    Collagen vascular disease (HCC)    COPD (chronic obstructive pulmonary disease) (HCC)    DDD (degenerative disc disease), lumbar    Dementia (HCC)    Depression    Diabetes mellitus without complication (HCC)    DVT (deep venous thrombosis) (HCC)    Fibromyalgia    GERD (gastroesophageal reflux disease)    Graves' disease with exophthalmos    HLD (hyperlipidemia)    Hypertension    Hypothyroidism    Lupus    Osteoporosis    Past Surgical History:  Procedure Laterality Date   ABDOMINAL HYSTERECTOMY     BURR HOLE Bilateral 01/30/2024   Procedure: CREATION, CRANIAL BURR HOLE, SUBDURAL HEMATOMA EVACUATIOIN;  Surgeon: Venetia Night, MD;  Location: [facility] ORS;  Service: Neurosurgery;  Laterality: Bilateral;  need ventriculostomy x 2   COLONOSCOPY WITH PROPOFOL N/A 06/11/2021   Procedure: COLONOSCOPY WITH PROPOFOL;  Surgeon: Regis Bill, MD;  Location: [facility] ENDOSCOPY;  Service: Endoscopy;  Laterality: N/A;  prefers afternoon Eliquis   COLONOSCOPY WITH PROPOFOL N/A 12/19/2022   Procedure: COLONOSCOPY WITH PROPOFOL;  Surgeon: Regis Bill, MD;  Location: [facility] ENDOS[facility];  Service: Endoscopy;  Laterality: N/A;   ESOPHAGOGASTRODUODENOSCOPY N/A 06/11/2021   Procedure: ESOPHAGOGASTRODUODENOSCOPY (EGD);  Surgeon: Regis Bill, MD;  Location: [facility] ENDOSCOPY;  Service: Endoscopy;  Laterality: N/A;   EYE SURGERY Right    IVC  FILTER REMOVAL N/A 07/21/2019   Procedure: IVC FILTER REMOVAL;  Surgeon: Annice Needy, MD;  Location: [facility] INVASIVE CV LAB;  Service: Cardiovascular;  Laterality: N/A;   LOWER EXTREMITY INTERVENTION Right 04/22/2019   Procedure: IVC Filter Insertion with Right Lower Extremity Venous Lysis;  Surgeon: Annice Needy, MD;  Location: [facility] INVASIVE CV LAB;  Service: Cardiovascular;  Laterality: Right;   OOPHORECTOMY     Family History  Problem Relation Age of Onset   Stomach cancer Mother    Heart attack Father    Breast cancer Neg Hx    Social History:  reports that she quit smoking about 23 years ago. Her smoking use included cigarettes. She started smoking about 40 years ago. She has a 17 pack-year smoking history. She has never used smokeless tobacco. She reports that she does not currently use alcohol. She reports that she does not use drugs. Allergies:  Allergies  Allergen Reactions   Morphine And Codeine Anaphylaxis   Montelukast     Other reaction(s): Other (See Comments) Nightmares   Antihistamines, Diphenhydramine-Type Nausea Only   Aspirin Other (See Comments)    Patient on blood thinners and was told not to take   Benadryl [Diphenhydramine Hcl] Hives   Codeine Other (See Comments)    AMS   Medications Prior to Admission  Medication Sig Dispense Refill   apixaban (ELIQUIS) 5 MG TABS tablet Take 1 tablet (5 mg total) by mouth 2 (two) times daily. 60 tablet 2   ascorbic acid (VITAMIN C) 100 MG tablet Take 1,000 mg by  mouth daily.     buPROPion (WELLBUTRIN SR) 150 MG 12 hr tablet Take 150 mg by mouth daily.     cyanocobalamin (VITAMIN B12) 1000 MCG tablet Take 1 tablet (1,000 mcg total) by mouth daily. 90 tablet 1   ergocalciferol (VITAMIN D2) 1.25 MG (50000 UT) capsule Take 50,000 Units by mouth every Friday.     levothyroxine (SYNTHROID) 125 MCG tablet Take 125 mcg by mouth daily before breakfast.     rosuvastatin (CRESTOR) 20 MG tablet Take 20 mg by mouth daily.      acetaminophen (TYLENOL) 650 MG CR tablet Take 650 mg by mouth every 8 (eight) hours as needed for pain. (Patient not taking: Reported on 01/26/2024)     cyclobenzaprine (FLEXERIL) 5 MG tablet Take 5 mg by mouth 3 (three) times daily as needed for muscle spasms. (Patient not taking: Reported on 01/21/2024)     donepezil (ARICEPT) 10 MG tablet Take 10 mg by mouth at bedtime. (Patient not taking: Reported on 01/31/2024)     ferrous sulfate 325 (65 FE) MG tablet Take by mouth.     losartan (COZAAR) 50 MG tablet Take 50 mg by mouth daily. prn (Patient not taking: Reported on 01/31/2024)     memantine (NAMENDA) 5 MG tablet Take 5 mg by mouth daily. (Patient not taking: Reported on 01/31/2024)     metFORMIN (GLUCOPHAGE) 500 MG tablet Take 500 mg by mouth daily.  (Patient not taking: Reported on 01/31/2024)     sertraline (ZOLOFT) 25 MG tablet Take 25 mg by mouth daily. (Patient not taking: Reported on 01/31/2024)      Home: Home Living Family/patient expects to be discharged to:: Skilled nursing facility Living Arrangements: Children Available Help at Discharge: Family, Available PRN/intermittently Type of Home: House Home Access: Stairs to enter Secretary/administrator of Steps: 4 Entrance Stairs-Rails: Right Home Equipment: Rollator (4 wheels) Additional Comments: information from chart, will need to confirm  Lives With: Daughter  Functional History: Prior Function Prior Level of Function : Needs assist  Cognitive Assist :  (baseline dementia) Mobility Comments: per chart, pt amb household distances ADLs Comments: per chart, pt requires assist for bathing, all IADLs; Functional Status:  Mobility: Bed Mobility Overal bed mobility: Needs Assistance Bed Mobility: Supine to Sit Rolling: Min assist Supine to sit: Min assist Sit to supine: Min assist General bed mobility comments: Pt requires assistance for her right side and cues Transfers Overall transfer level: Needs assistance Equipment used:  1 person hand held assist Transfers: Sit to/from Stand Sit to Stand: Min assist General transfer comment: L hand held assist and steadying for balance Ambulation/Gait Ambulation/Gait assistance: Min assist Gait Distance (Feet): 200 Feet Assistive device: 1 person hand held assist Gait Pattern/deviations: Step-through pattern, Decreased step length - right, Decreased step length - left, Narrow base of support, Decreased stride length General Gait Details: patient doing well with ambulation. Requires cues for direction due to poor vision. Follows 1 step direction with increased time and inconsistently. Able to increase distance significantly this session. Patient requires heavy cues for safety, ADLs, ambulation. Poor vision and cognition. Gait velocity: decreased Pre-gait activities: with physical assistance, patient is able to take 3 side steps to the right with physical assistance for advancement of RLE. weight shifting faciliation to the left  movement of RLE with active hip movement noted. patient relying on the bed for posterior leg support    ADL: ADL Overall ADL's : Needs assistance/impaired Eating/Feeding: Set up Grooming: Wash/dry face, Brushing hair, Standing, Oral  care Upper Body Dressing : Maximal assistance Upper Body Dressing Details (indicate cue type and reason): anticipate Lower Body Dressing: Maximal assistance Lower Body Dressing Details (indicate cue type and reason): donn ted hose/socks, raised LLE to assist Toilet Transfer: Regular Toilet, Ambulation, Cueing for safety, Cueing for sequencing, +2 for safety/equipment (HHA) Toileting- Clothing Manipulation and Hygiene: Maximal assistance, Sit to/from stand Functional mobility during ADLs: +2 for safety/equipment (HHA) General ADL Comments: Pt completed standing grooming tasks at sink level then sat to complete oral care for additional stability. Pt benefited from moderate verbal and visual cues for tasks  sequence.  Cognition: Cognition Overall Cognitive Status: Impaired/Different from baseline Arousal/Alertness: Awake/alert Orientation Level: Oriented to person, Disoriented to place, Disoriented to time, Disoriented to situation Year:  (required binary options) Month:  (required cues) Day of Week: Correct Attention: Sustained Sustained Attention: Impaired Sustained Attention Impairment: Verbal basic Memory: Impaired Memory Impairment: Decreased recall of new information, Storage deficit Awareness: Impaired Awareness Impairment: Intellectual impairment Problem Solving: Impaired Problem Solving Impairment: Verbal basic Executive Function: Organizing Organizing: Impaired Organizing Impairment: Verbal basic Behaviors:  (none) Safety/Judgment:  (emerging- reports concern for fall if she attempted to stand) Cognition Arousal: Alert Behavior During Therapy: WFL for tasks assessed/performed Overall Cognitive Status: Impaired/Different from baseline  Blood pressure 138/63, pulse (!) 54, temperature 98.1 F (36.7 C), temperature source Oral, resp. rate 16, height 4\' 9"  (1.448 m), weight 49.9 kg, SpO2 100%. Physical Exam Gen: no distress, normal appearing HEENT: oral mucosa pink and moist, NCAT Cardio: Bradycardic Chest: normal effort, normal rate of breathing Abd: soft, non-distended Ext: no edema Psych: pleasant, normal affect Skin: staples c/d/I on scalp Neuro: Alert and oriented x3, impaired awareness and sustained attention, right sided strength 3/5, left sided strength intact  Results for orders placed or performed during the hospital encounter of 01/29/24 (from the past 24 hours)  Glucose, capillary     Status: Abnormal   Collection Time: 02/04/24  3:40 PM  Result Value Ref Range   Glucose-Capillary 104 (H) 70 - 99 mg/dL  Glucose, capillary     Status: None   Collection Time: 02/04/24  8:39 PM  Result Value Ref Range   Glucose-Capillary 95 70 - 99 mg/dL  Glucose,  capillary     Status: None   Collection Time: 02/05/24  8:19 AM  Result Value Ref Range   Glucose-Capillary 97 70 - 99 mg/dL  Glucose, capillary     Status: Abnormal   Collection Time: 02/05/24 11:34 AM  Result Value Ref Range   Glucose-Capillary 148 (H) 70 - 99 mg/dL   No results found.  Assessment/Plan: Diagnosis: Bilateral subdural hematomas Does the need for close, 24 hr/day medical supervision in concert with the patient's rehab needs make it unreasonable for this patient to be served in a less intensive setting? Yes Co-Morbidities requiring supervision/potential complications:  1) Bradycardia: continue to monitor HR TID 2) Hypertension: continue to monitor BP TID 3) Constipation: last BM 3/27, consider discontinuing senna daily 4) Depression: continue Zoloft 5) HLD: continue crestor Due to bladder management, bowel management, safety, skin/wound care, disease management, medication administration, pain management, and patient education, does the patient require 24 hr/day rehab nursing? Yes Does the patient require coordinated care of a physician, rehab nurse, therapy disciplines of PT, OT, SLP to address physical and functional deficits in the context of the above medical diagnosis(es)? Yes Addressing deficits in the following areas: balance, endurance, locomotion, strength, transferring, bowel/bladder control, bathing, dressing, feeding, grooming, toileting, cognition, and psychosocial support Can  the patient actively participate in an intensive therapy program of at least 3 hrs of therapy per day at least 5 days per week? Yes The potential for patient to make measurable gains while on inpatient rehab is excellent Anticipated functional outcomes upon discharge from inpatient rehab are supervision  with PT, supervision with OT, supervision with SLP. Estimated rehab length of stay to reach the above functional goals is: 5-7 days Anticipated discharge destination: Home Overall  Rehab/Functional Prognosis: excellent  POST ACUTE RECOMMENDATIONS: This patient's condition is appropriate for continued rehabilitative care in the following setting: CIR Patient has agreed to participate in recommended program. Yes Note that insurance prior authorization may be required for reimbursement for recommended care.   I have personally performed a face to face diagnostic evaluation of this patient. Additionally, I have examined the patient's medical record including any pertinent labs and radiographic images.    Thanks,  Horton Chin, MD 02/05/2024

## 2024-02-06 DIAGNOSIS — S065XAA Traumatic subdural hemorrhage with loss of consciousness status unknown, initial encounter: Secondary | ICD-10-CM | POA: Diagnosis not present

## 2024-02-06 LAB — GLUCOSE, CAPILLARY
Glucose-Capillary: 100 mg/dL — ABNORMAL HIGH (ref 70–99)
Glucose-Capillary: 208 mg/dL — ABNORMAL HIGH (ref 70–99)
Glucose-Capillary: 108 mg/dL — ABNORMAL HIGH (ref 70–99)
Glucose-Capillary: 125 mg/dL — ABNORMAL HIGH (ref 70–99)

## 2024-02-06 NOTE — Progress Notes (Signed)
 Mobility Specialist - Progress Note    02/06/24 1723  Mobility  Activity Ambulated with assistance in hallway  Level of Assistance Minimal assist, patient does 75% or more (HHA)  Assistive Device Front wheel walker  Distance Ambulated (ft) 800 ft  Range of Motion/Exercises Active  Activity Response Tolerated well  Mobility Referral Yes  Mobility visit 1 Mobility   Pt sitting EOB upon entry on RA. Pt STS MinA and ambulates to hallway out to medical mall atrium HHA and lower back support from author to maintain upright standing. Pt sat in wheelchair and wheeled outside by daughter for several minutes. Pt returned to bed and left with needs in reach. Chair alarm activated.   Johnathan Hausen Mobility Specialist 02/06/24, 5:27 PM

## 2024-02-06 NOTE — Plan of Care (Signed)
 Received report, sleeping when checked, moves well in bed with HOB elevated 30 degrees. Rouses easily, speech slow, mild R side weakness noted. Staples X2 sites to head intact, no redness/swelling noted. Answers questions appropriately, reminded of location. Tolerating fluids/diet.  2200 settled for night, continue to monitor, bed low, bed alarms in place.  0012 Sleeping when checked, moves well in bed.  0610 rouses easily, slept most of night

## 2024-02-06 NOTE — Progress Notes (Signed)
 [facility]  - [facility]   PATIENT NAME: Alison Lopez    MR#:  [id number]  DATE OF BIRTH:  07-25-1956  SUBJECTIVE:  No complaints, tolerating diet. No family at bedside. Patient tolerating PT very well.  VITALS:  Blood pressure 129/63, pulse 63, temperature 98.9 F (37.2 C), temperature source Oral, resp. rate 16, height 4\' 9"  (1.448 m), weight 49.9 kg, SpO2 97%.  PHYSICAL EXAMINATION:   GENERAL:  68 y.o.-year-old patient with no acute distress.  LUNGS: Normal breath sounds bilaterally CARDIOVASCULAR: S1, S2 normal. ABDOMEN: Soft, nontender, nondistended.  EXTREMITIES: No  edema b/l.    SKIN:  surgical incisions on scalp--staples+ NEUROLOGIC dysarthria, right upper and lower extremity weakness--improving  LABORATORY PANEL:  CBC Recent Labs  Lab 02/01/24 0411  WBC 10.4  HGB 11.4*  HCT 32.7*  PLT 64*       Assessment and Plan   Alison Lopez is a 68 year old female with dementia, history of DVT on Eliquis, chronic thrombocytopenia, B12 deficiency, hypertension, who presents emergency department for chief concerns of altered mental status.  Daughter last saw her on Thursday around 5 PM. Patient reports that on Thursday evening, patient went to bed and urinated on herself and defecated on himself. This is not normal for her.   CT head wo contrast: Was read as positive bilateral mixed density subdural hematomas, somewhat biconvex, 17 mm maximum thickness on the left and 14 mm maximum thickness on the right.   Bilateral subdural hematomas [facility]) --Neurosurgery has been consulted by EDP, Dr. Marcell Barlow is aware --Hold Eliquis -- patient is status post bilateral frontal burr hole for drainage of subdural hematoma with subdural drains placed on each side -- CT scan of the head to be done tomorrow --3/23-- CT head this morning looks stable postop subdural hematoma evacuation. Perm neurosurgery okay to start PT OT and speech. Okay for DVT  prophylaxis. --3/24-- working with PT OT. Will transfer out to MedSurg. Okay with neurosurgery. Bilateral drains from the scalp removed. TOC for discharge planning --3/25-- improving right upper and lower extremity weakness. Seen patient today with physical therapist. Recommending CIR. TOC for discharge planning. Pending insurance auth -- f/u neurosurgery pod 14 for staple/suture removal (on or around 4/5)   chronic thrombocytopenia (HCC) --Goal platelet > 75 prior to neurosurgery in the AM --Decadron 40 mg IV, daily, 4 days ordered on admission--completed --plt 52K--1 unit --78K-- 64K -- Dr. Cathie Hoops recommends continue to hold eliquis and follow-up with her as outpatient.  Type 2 diabetes mellitus without complication (HCC) --Insulin SSI with at bedtime coverage ordered --Goal inpatient blood glucose levels 140-180  Antiphospholipid antibody syndrome -- previously failed Coumadin -- recently started seeing hematology and repeat APS labs consistent with the diagnosis. 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 --Home Eliquis will be held  in setting of bilateral subdural hematoma   Depression/h/o Dementia --Sertraline    COPD (chronic obstructive pulmonary disease) (HCC) --stable --prn nebs    Essential hypertension --bp soft    Procedures: status post bilateral subdural hematoma evacuation Family communication : daughter amber updated telephonically 3/29  consults : neurosurgery CODE STATUS: full DVT Prophylaxis : lovenox Level of care: Med-Surg Status is: Inpatient Remains inpatient appropriate because:pending discharge planning (cir vs snf), auth denied for cir,  daughter is appealing, toc consulted for snf  PT OT recommending acute  inpatient rehab/CIR--Awaiting CIR denial appeal done by dter   Enedina Finner M.D    [facility]   CC: Primary care physician; Rayetta Humphrey, MD

## 2024-02-07 DIAGNOSIS — S065XAA Traumatic subdural hemorrhage with loss of consciousness status unknown, initial encounter: Secondary | ICD-10-CM | POA: Diagnosis not present

## 2024-02-07 LAB — BASIC METABOLIC PANEL WITH GFR
Anion gap: 5 (ref 5–15)
BUN: 21 mg/dL (ref 8–23)
CO2: 28 mmol/L (ref 22–32)
Calcium: 8.7 mg/dL — ABNORMAL LOW (ref 8.9–10.3)
Chloride: 104 mmol/L (ref 98–111)
Creatinine, Ser: 1.01 mg/dL — ABNORMAL HIGH (ref 0.44–1.00)
GFR, Estimated: >60 mL/min (ref >60)
Glucose, Bld: 98 mg/dL (ref 70–99)
Potassium: 4.7 mmol/L (ref 3.5–5.1)
Sodium: 137 mmol/L (ref 135–145)

## 2024-02-07 LAB — GLUCOSE, CAPILLARY
Glucose-Capillary: 101 mg/dL — ABNORMAL HIGH (ref 70–99)
Glucose-Capillary: 179 mg/dL — ABNORMAL HIGH (ref 70–99)
Glucose-Capillary: 82 mg/dL (ref 70–99)
Glucose-Capillary: 103 mg/dL — ABNORMAL HIGH (ref 70–99)

## 2024-02-07 LAB — CBC
HCT: 32.4 % — ABNORMAL LOW (ref 36.0–46.0)
Hemoglobin: 11.2 g/dL — ABNORMAL LOW (ref 12.0–15.0)
MCH: 30.8 pg (ref 26.0–34.0)
MCHC: 34.6 g/dL (ref 30.0–36.0)
MCV: 89 fL (ref 80.0–100.0)
Platelets: 69 10*3/uL — ABNORMAL LOW (ref 150–400)
RBC: 3.64 MIL/uL — ABNORMAL LOW (ref 3.87–5.11)
RDW: 12.8 % (ref 11.5–15.5)
WBC: 6.7 10*3/uL (ref 4.0–10.5)
nRBC: 0 % (ref 0.0–0.2)

## 2024-02-07 NOTE — TOC Progression Note (Addendum)
 Transition of Care [facility]) - Progression Note    Patient Details  Name: Alison Lopez MRN: [id number] Date of Birth: Jul 27, 1956  [facility]) CM/SW Contact  Liliana Cline, LCSW Phone Number: 02/07/2024, 2:08 PM  Clinical Narrative:    Asked CIR for update. Per CIR, patient has won her appeal and they should have a bed tomorrow. MD aware.   Expected Discharge Plan:  (Acute Inpatient rehab v/s SNF) Barriers to Discharge: No Barriers Identified  Expected Discharge Plan and Services   Discharge Planning Services: CM Consult Post Acute Care Choice: IP Rehab, Skilled Nursing Facility Living arrangements for the past 2 months: Single Family Home                                       Social Determinants of Health (SDOH) Interventions SDOH Screenings   Food Insecurity: No Food Insecurity (01/29/2024)  Housing: Low Risk  (01/29/2024)  Transportation Needs: No Transportation Needs (01/29/2024)  Utilities: Not At Risk (01/29/2024)  Depression (PHQ2-9): Low Risk  (01/21/2024)  Social Connections: Socially Isolated (01/29/2024)  Tobacco Use: Medium Risk (01/29/2024)    Readmission Risk Interventions     No data to display

## 2024-02-07 NOTE — Plan of Care (Signed)
  Problem: Coping: Goal: Ability to adjust to condition or change in health will improve Outcome: Progressing   Problem: Health Behavior/Discharge Planning: Goal: Ability to manage health-related needs will improve Outcome: Progressing   Problem: Skin Integrity: Goal: Risk for impaired skin integrity will decrease Outcome: Progressing   Problem: Clinical Measurements: Goal: Respiratory complications will improve Outcome: Progressing   Problem: Activity: Goal: Risk for activity intolerance will decrease Outcome: Progressing

## 2024-02-07 NOTE — Plan of Care (Signed)
  Problem: Fluid Volume: Goal: Ability to maintain a balanced intake and output will improve Outcome: Progressing   Problem: Nutritional: Goal: Maintenance of adequate nutrition will improve Outcome: Progressing   Problem: Skin Integrity: Goal: Risk for impaired skin integrity will decrease Outcome: Progressing   Problem: Nutrition: Goal: Adequate nutrition will be maintained Outcome: Progressing   Problem: Coping: Goal: Level of anxiety will decrease Outcome: Progressing   Problem: Pain Managment: Goal: General experience of comfort will improve and/or be controlled Outcome: Progressing   Problem: Safety: Goal: Ability to remain free from injury will improve Outcome: Progressing   Problem: Skin Integrity: Goal: Risk for impaired skin integrity will decrease Outcome: Progressing   Problem: Nutrition: Goal: Adequate nutrition will be maintained Outcome: Progressing   Problem: Pain Managment: Goal: General experience of comfort will improve and/or be controlled Outcome: Progressing   Problem: Safety: Goal: Ability to remain free from injury will improve Outcome: Progressing   Problem: Skin Integrity: Goal: Risk for impaired skin integrity will decrease Outcome: Progressing   Problem: Education: Goal: Ability to describe self-care measures that may prevent or decrease complications (Diabetes Survival Skills Education) will improve Outcome: Not Applicable Goal: Individualized Educational Video(s) Outcome: Not Applicable

## 2024-02-07 NOTE — Progress Notes (Signed)
 [facility]  - [place] at [facility]   PATIENT NAME: Alison Lopez    MR#:  [id number]  DATE OF BIRTH:  [birth year]  SUBJECTIVE:  No complaints  No family at bedside. Patient tolerating PT very well.  VITALS:  Blood pressure (!) 137/56, pulse (!) 53, temperature 98.2 F (36.8 C), resp. rate 18, height 4\' 9"  (1.448 m), weight 49.9 kg, SpO2 98%.  PHYSICAL EXAMINATION:   GENERAL:  68 y.o.-year-old patient with no acute distress.  LUNGS: Normal breath sounds bilaterally CARDIOVASCULAR: S1, S2 normal. ABDOMEN: Soft, nontender, nondistended.  EXTREMITIES: No  edema b/l.    SKIN:  bilateral surgical incisions on scalp--staples+ NEUROLOGIC dysarthria, right upper and lower extremity weakness--improving  LABORATORY PANEL:  CBC Recent Labs  Lab 02/07/24 0443  WBC 6.7  HGB 11.2*  HCT 32.4*  PLT 69*       Assessment and Plan   Alison Lopez is a 68 year old female with dementia, history of DVT on Eliquis, chronic thrombocytopenia, B12 deficiency, hypertension, who presents emergency department for chief concerns of altered mental status.  Daughter last saw her on Thursday around 5 PM. Patient reports that on Thursday evening, patient went to bed and urinated on herself and defecated on himself. This is not normal for her.   CT head wo contrast: Was read as positive bilateral mixed density subdural hematomas, somewhat biconvex, 17 mm maximum thickness on the left and 14 mm maximum thickness on the right.   Bilateral subdural hematomas [facility]) --Neurosurgery has been consulted by EDP, Dr. Marcell Lopez is aware --Hold Eliquis -- patient is status post bilateral frontal burr hole for drainage of subdural hematoma with subdural drains placed on each side -- CT scan of the head to be done tomorrow --3/23-- CT head this morning looks stable postop subdural hematoma evacuation. Perm neurosurgery okay to start PT OT and speech. Okay for DVT prophylaxis. --3/24-- working  with PT OT. Will transfer out to MedSurg. Okay with neurosurgery. Bilateral drains from the scalp removed. TOC for discharge planning --3/25-- improving right upper and lower extremity weakness. Seen patient today with physical therapist. Recommending CIR. TOC for discharge planning. Pending insurance auth -- f/u neurosurgery pod 14 for staple/suture removal (on or around 4/5)   chronic thrombocytopenia (HCC) --Goal platelet > 75 prior to neurosurgery in the AM --Alison Lopez 40 mg IV, daily, 4 days ordered on admission--completed --plt 52K--1 unit --78K-- 64K--69K -- Dr. Cathie Hoops recommends continue to hold eliquis and follow-up with her as outpatient.  Type 2 diabetes mellitus without complication (HCC) --Insulin SSI with at bedtime coverage ordered --Goal inpatient blood glucose levels 140-180  Antiphospholipid antibody syndrome -- previously failed Coumadin -- recently started seeing hematology and repeat APS labs consistent with the diagnosis. 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 --Home Eliquis will be held  in setting of bilateral subdural hematoma   Depression/h/o Dementia --Sertraline    COPD (chronic obstructive pulmonary disease) (HCC) --stable --prn nebs    Essential hypertension --bp soft    Procedures: status post bilateral subdural hematoma evacuation Family communication : daughter amber updated telephonically 3/29  consults : neurosurgery CODE STATUS: full DVT Prophylaxis : lovenox Level of care: Med-Surg Status is: Inpatient Remains inpatient appropriate because:pending discharge planning (cir vs snf), auth denied for cir, daughter is appealing, toc consulted for snf  PT OT recommending  acute inpatient rehab/CIR--Awaiting CIR denial appeal done by dter   Alison Lopez M.D    [facility]    CC: Primary care physician; Rayetta Humphrey, MD

## 2024-02-08 ENCOUNTER — Inpatient Hospital Stay (HOSPITAL_COMMUNITY)
Admission: AD | Admit: 2024-02-08 | Discharge: 2024-02-15 | DRG: 057 | Disposition: A | Discharge status: Other Acute Inpatient Hospital | Source: Other Acute Inpatient Hospital | Attending: Physical Medicine & Rehabilitation | Admitting: Physical Medicine & Rehabilitation

## 2024-02-08 ENCOUNTER — Other Ambulatory Visit: Payer: Self-pay

## 2024-02-08 ENCOUNTER — Encounter (HOSPITAL_COMMUNITY): Payer: Self-pay | Admitting: Physical Medicine and Rehabilitation

## 2024-02-08 DIAGNOSIS — I69851 Hemiplegia and hemiparesis following other cerebrovascular disease affecting right dominant side: Secondary | ICD-10-CM | POA: Diagnosis not present

## 2024-02-08 DIAGNOSIS — G9341 Metabolic encephalopathy: Secondary | ICD-10-CM | POA: Diagnosis not present

## 2024-02-08 DIAGNOSIS — J449 Chronic obstructive pulmonary disease, unspecified: Secondary | ICD-10-CM | POA: Diagnosis present

## 2024-02-08 DIAGNOSIS — D649 Anemia, unspecified: Secondary | ICD-10-CM | POA: Diagnosis not present

## 2024-02-08 DIAGNOSIS — Z90722 Acquired absence of ovaries, bilateral: Secondary | ICD-10-CM

## 2024-02-08 DIAGNOSIS — E782 Mixed hyperlipidemia: Secondary | ICD-10-CM | POA: Diagnosis not present

## 2024-02-08 DIAGNOSIS — F0394 Unspecified dementia, unspecified severity, with anxiety: Secondary | ICD-10-CM | POA: Diagnosis present

## 2024-02-08 DIAGNOSIS — I69822 Dysarthria following other cerebrovascular disease: Secondary | ICD-10-CM

## 2024-02-08 DIAGNOSIS — F0390 Unspecified dementia without behavioral disturbance: Secondary | ICD-10-CM | POA: Diagnosis not present

## 2024-02-08 DIAGNOSIS — D6861 Antiphospholipid syndrome: Secondary | ICD-10-CM | POA: Diagnosis present

## 2024-02-08 DIAGNOSIS — D693 Immune thrombocytopenic purpura: Secondary | ICD-10-CM

## 2024-02-08 DIAGNOSIS — G4700 Insomnia, unspecified: Secondary | ICD-10-CM | POA: Diagnosis present

## 2024-02-08 DIAGNOSIS — Z885 Allergy status to narcotic agent status: Secondary | ICD-10-CM

## 2024-02-08 DIAGNOSIS — R4789 Other speech disturbances: Secondary | ICD-10-CM | POA: Diagnosis not present

## 2024-02-08 DIAGNOSIS — R414 Neurologic neglect syndrome: Secondary | ICD-10-CM | POA: Diagnosis present

## 2024-02-08 DIAGNOSIS — K921 Melena: Secondary | ICD-10-CM | POA: Diagnosis not present

## 2024-02-08 DIAGNOSIS — I959 Hypotension, unspecified: Secondary | ICD-10-CM | POA: Diagnosis not present

## 2024-02-08 DIAGNOSIS — K219 Gastro-esophageal reflux disease without esophagitis: Secondary | ICD-10-CM | POA: Diagnosis present

## 2024-02-08 DIAGNOSIS — E785 Hyperlipidemia, unspecified: Secondary | ICD-10-CM | POA: Diagnosis present

## 2024-02-08 DIAGNOSIS — S065XAA Traumatic subdural hemorrhage with loss of consciousness status unknown, initial encounter: Secondary | ICD-10-CM | POA: Diagnosis not present

## 2024-02-08 DIAGNOSIS — K0889 Other specified disorders of teeth and supporting structures: Secondary | ICD-10-CM | POA: Diagnosis present

## 2024-02-08 DIAGNOSIS — M810 Age-related osteoporosis without current pathological fracture: Secondary | ICD-10-CM | POA: Diagnosis present

## 2024-02-08 DIAGNOSIS — I6981 Attention and concentration deficit following other cerebrovascular disease: Secondary | ICD-10-CM

## 2024-02-08 DIAGNOSIS — F419 Anxiety disorder, unspecified: Secondary | ICD-10-CM | POA: Diagnosis present

## 2024-02-08 DIAGNOSIS — Z888 Allergy status to other drugs, medicaments and biological substances status: Secondary | ICD-10-CM

## 2024-02-08 DIAGNOSIS — F32A Depression, unspecified: Secondary | ICD-10-CM | POA: Diagnosis present

## 2024-02-08 DIAGNOSIS — E11319 Type 2 diabetes mellitus with unspecified diabetic retinopathy without macular edema: Secondary | ICD-10-CM | POA: Diagnosis present

## 2024-02-08 DIAGNOSIS — I10 Essential (primary) hypertension: Secondary | ICD-10-CM | POA: Diagnosis present

## 2024-02-08 DIAGNOSIS — I69820 Aphasia following other cerebrovascular disease: Secondary | ICD-10-CM | POA: Diagnosis not present

## 2024-02-08 DIAGNOSIS — Z87891 Personal history of nicotine dependence: Secondary | ICD-10-CM | POA: Diagnosis not present

## 2024-02-08 DIAGNOSIS — S065XAD Traumatic subdural hemorrhage with loss of consciousness status unknown, subsequent encounter: Secondary | ICD-10-CM | POA: Diagnosis present

## 2024-02-08 DIAGNOSIS — Z800 Family history of malignant neoplasm of digestive organs: Secondary | ICD-10-CM

## 2024-02-08 DIAGNOSIS — Z79890 Hormone replacement therapy (postmenopausal): Secondary | ICD-10-CM

## 2024-02-08 DIAGNOSIS — R350 Frequency of micturition: Secondary | ICD-10-CM | POA: Diagnosis not present

## 2024-02-08 DIAGNOSIS — H548 Legal blindness, as defined in USA: Secondary | ICD-10-CM | POA: Diagnosis present

## 2024-02-08 DIAGNOSIS — H53461 Homonymous bilateral field defects, right side: Secondary | ICD-10-CM | POA: Diagnosis present

## 2024-02-08 DIAGNOSIS — M797 Fibromyalgia: Secondary | ICD-10-CM | POA: Diagnosis present

## 2024-02-08 DIAGNOSIS — Z7901 Long term (current) use of anticoagulants: Secondary | ICD-10-CM | POA: Diagnosis not present

## 2024-02-08 DIAGNOSIS — M1990 Unspecified osteoarthritis, unspecified site: Secondary | ICD-10-CM | POA: Diagnosis present

## 2024-02-08 DIAGNOSIS — D696 Thrombocytopenia, unspecified: Secondary | ICD-10-CM | POA: Diagnosis present

## 2024-02-08 DIAGNOSIS — R509 Fever, unspecified: Secondary | ICD-10-CM | POA: Diagnosis not present

## 2024-02-08 DIAGNOSIS — Z79899 Other long term (current) drug therapy: Secondary | ICD-10-CM | POA: Diagnosis not present

## 2024-02-08 DIAGNOSIS — E039 Hypothyroidism, unspecified: Secondary | ICD-10-CM | POA: Diagnosis present

## 2024-02-08 DIAGNOSIS — I69811 Memory deficit following other cerebrovascular disease: Secondary | ICD-10-CM

## 2024-02-08 DIAGNOSIS — Z86718 Personal history of other venous thrombosis and embolism: Secondary | ICD-10-CM

## 2024-02-08 DIAGNOSIS — F0393 Unspecified dementia, unspecified severity, with mood disturbance: Secondary | ICD-10-CM | POA: Diagnosis present

## 2024-02-08 DIAGNOSIS — G9340 Encephalopathy, unspecified: Secondary | ICD-10-CM | POA: Diagnosis not present

## 2024-02-08 DIAGNOSIS — E0500 Thyrotoxicosis with diffuse goiter without thyrotoxic crisis or storm: Secondary | ICD-10-CM | POA: Diagnosis present

## 2024-02-08 DIAGNOSIS — I69818 Other symptoms and signs involving cognitive functions following other cerebrovascular disease: Principal | ICD-10-CM

## 2024-02-08 DIAGNOSIS — R32 Unspecified urinary incontinence: Secondary | ICD-10-CM | POA: Diagnosis present

## 2024-02-08 DIAGNOSIS — F39 Unspecified mood [affective] disorder: Secondary | ICD-10-CM | POA: Diagnosis not present

## 2024-02-08 DIAGNOSIS — I69814 Frontal lobe and executive function deficit following other cerebrovascular disease: Secondary | ICD-10-CM

## 2024-02-08 DIAGNOSIS — Z8249 Family history of ischemic heart disease and other diseases of the circulatory system: Secondary | ICD-10-CM

## 2024-02-08 DIAGNOSIS — M329 Systemic lupus erythematosus, unspecified: Secondary | ICD-10-CM | POA: Diagnosis present

## 2024-02-08 DIAGNOSIS — Z886 Allergy status to analgesic agent status: Secondary | ICD-10-CM

## 2024-02-08 DIAGNOSIS — R159 Full incontinence of feces: Secondary | ICD-10-CM | POA: Diagnosis present

## 2024-02-08 DIAGNOSIS — Z9889 Other specified postprocedural states: Secondary | ICD-10-CM

## 2024-02-08 DIAGNOSIS — Z90710 Acquired absence of both cervix and uterus: Secondary | ICD-10-CM

## 2024-02-08 DIAGNOSIS — E119 Type 2 diabetes mellitus without complications: Secondary | ICD-10-CM | POA: Diagnosis not present

## 2024-02-08 DIAGNOSIS — H5010 Unspecified exotropia: Secondary | ICD-10-CM | POA: Diagnosis present

## 2024-02-08 DIAGNOSIS — K5900 Constipation, unspecified: Secondary | ICD-10-CM | POA: Diagnosis present

## 2024-02-08 DIAGNOSIS — Z638 Other specified problems related to primary support group: Secondary | ICD-10-CM

## 2024-02-08 DIAGNOSIS — E538 Deficiency of other specified B group vitamins: Secondary | ICD-10-CM | POA: Diagnosis present

## 2024-02-08 DIAGNOSIS — I6200 Nontraumatic subdural hemorrhage, unspecified: Secondary | ICD-10-CM | POA: Diagnosis present

## 2024-02-08 DIAGNOSIS — M51369 Other intervertebral disc degeneration, lumbar region without mention of lumbar back pain or lower extremity pain: Secondary | ICD-10-CM | POA: Diagnosis present

## 2024-02-08 DIAGNOSIS — Z87892 Personal history of anaphylaxis: Secondary | ICD-10-CM

## 2024-02-08 LAB — GLUCOSE, CAPILLARY
Glucose-Capillary: 89 mg/dL (ref 70–99)
Glucose-Capillary: 135 mg/dL — ABNORMAL HIGH (ref 70–99)
Glucose-Capillary: 148 mg/dL — ABNORMAL HIGH (ref 70–99)

## 2024-02-08 MED ORDER — SENNA 8.6 MG PO TABS
1.0000 | ORAL_TABLET | Freq: Every day | ORAL | Status: DC
  Administered 2024-02-09 – 2024-02-15 (×7): 8.6 mg via ORAL
  Filled 2024-02-08 (×7): qty 1

## 2024-02-08 MED ORDER — ROSUVASTATIN CALCIUM 20 MG PO TABS
20.0000 mg | ORAL_TABLET | Freq: Every day | ORAL | Status: DC
  Administered 2024-02-09 – 2024-02-15 (×7): 20 mg via ORAL
  Filled 2024-02-08 (×7): qty 1

## 2024-02-08 MED ORDER — ENOXAPARIN SODIUM 40 MG/0.4ML IJ SOSY
40.0000 mg | PREFILLED_SYRINGE | INTRAMUSCULAR | Status: DC
  Administered 2024-02-08 – 2024-02-14 (×7): 40 mg via SUBCUTANEOUS
  Filled 2024-02-08 (×7): qty 0.4

## 2024-02-08 MED ORDER — VITAMIN B-12 1000 MCG PO TABS
1000.0000 ug | ORAL_TABLET | Freq: Every day | ORAL | Status: DC
  Administered 2024-02-09 – 2024-02-15 (×7): 1000 ug via ORAL
  Filled 2024-02-08 (×8): qty 1

## 2024-02-08 MED ORDER — LEVOTHYROXINE SODIUM 25 MCG PO TABS
125.0000 ug | ORAL_TABLET | Freq: Every day | ORAL | Status: DC
  Administered 2024-02-09 – 2024-02-15 (×7): 125 ug via ORAL
  Filled 2024-02-08 (×7): qty 1

## 2024-02-08 MED ORDER — VITAMIN C 500 MG PO TABS
1000.0000 mg | ORAL_TABLET | Freq: Every day | ORAL | Status: DC
  Administered 2024-02-09 – 2024-02-15 (×7): 1000 mg via ORAL
  Filled 2024-02-08 (×7): qty 2

## 2024-02-08 MED ORDER — ENOXAPARIN SODIUM 40 MG/0.4ML IJ SOSY: 40.0000 mg | PREFILLED_SYRINGE | INTRAMUSCULAR | Status: DC

## 2024-02-08 MED ORDER — SERTRALINE HCL 50 MG PO TABS
25.0000 mg | ORAL_TABLET | Freq: Every day | ORAL | Status: DC
  Administered 2024-02-09 – 2024-02-15 (×7): 25 mg via ORAL
  Filled 2024-02-08 (×7): qty 1

## 2024-02-08 MED ORDER — POLYETHYLENE GLYCOL 3350 17 G PO PACK: 17.0000 g | PACK | Freq: Every day | ORAL | 0 refills | Status: DC

## 2024-02-08 MED ORDER — INSULIN ASPART 100 UNIT/ML IJ SOLN
0.0000 [IU] | Freq: Three times a day (TID) | INTRAMUSCULAR | Status: DC
  Administered 2024-02-08 – 2024-02-09 (×3): 1 [IU] via SUBCUTANEOUS
  Administered 2024-02-12: 2 [IU] via SUBCUTANEOUS
  Administered 2024-02-13 – 2024-02-15 (×2): 1 [IU] via SUBCUTANEOUS

## 2024-02-08 MED ORDER — POLYETHYLENE GLYCOL 3350 17 G PO PACK
17.0000 g | PACK | Freq: Every day | ORAL | Status: DC
  Administered 2024-02-09 – 2024-02-15 (×7): 17 g via ORAL
  Filled 2024-02-08 (×7): qty 1

## 2024-02-08 MED ORDER — FERROUS SULFATE 325 (65 FE) MG PO TABS
325.0000 mg | ORAL_TABLET | Freq: Every day | ORAL | Status: DC
  Administered 2024-02-09 – 2024-02-15 (×7): 325 mg via ORAL
  Filled 2024-02-08 (×7): qty 1

## 2024-02-08 MED ORDER — DONEPEZIL HCL 10 MG PO TABS
10.0000 mg | ORAL_TABLET | Freq: Every day | ORAL | Status: DC
  Administered 2024-02-08 – 2024-02-15 (×8): 10 mg via ORAL
  Filled 2024-02-08 (×8): qty 1

## 2024-02-08 MED ORDER — SENNA 8.6 MG PO TABS: 1.0000 | ORAL_TABLET | Freq: Every day | ORAL | 0 refills | Status: DC

## 2024-02-08 NOTE — Plan of Care (Signed)
  Problem: Skin Integrity: Goal: Risk for impaired skin integrity will decrease Outcome: Progressing   Problem: Education: Goal: Knowledge of General Education information will improve Description: Including pain rating scale, medication(s)/side effects and non-pharmacologic comfort measures Outcome: Progressing   Problem: Activity: Goal: Risk for activity intolerance will decrease Outcome: Not Progressing

## 2024-02-08 NOTE — Discharge Instructions (Addendum)
 Inpatient Rehab Discharge Instructions  Alison Lopez Discharge date and time: No discharge date for patient encounter.   Activities/Precautions/ Functional Status: Activity: activity as tolerated Diet: regular diet Wound Care: Routine skin checks Functional status:  ___ No restrictions     ___ Walk up steps independently ___ 24/7 supervision/assistance   ___ Walk up steps with assistance ___ Intermittent supervision/assistance  ___ Bathe/dress independently ___ Walk with walker     _x__ Bathe/dress with assistance ___ Walk Independently    ___ Shower independently ___ Walk with assistance    ___ Shower with assistance ___ No alcohol     ___ Return to work/school ________  Special Instructions: No driving smoking or alcohol   My questions have been answered and I understand these instructions. I will adhere to these goals and the provided educational materials after my discharge from the hospital.  Patient/Caregiver Signature _______________________________ Date __________  Clinician Signature _______________________________________ Date __________  Please bring this form and your medication list with you to all your follow-up doctor's appointments.     STOP taking these medications     apixaban 5 MG Tabs tablet Commonly known as: ELIQUIS    buPROPion 150 MG 12 hr tablet Commonly known as: WELLBUTRIN SR    cyclobenzaprine 5 MG tablet Commonly known as: FLEXERIL    losartan 50 MG tablet Commonly known as: COZAAR    memantine 5 MG tablet Commonly known as: NAMENDA    metFORMIN 500 MG tablet Commonly known as: GLUCOPHAGE

## 2024-02-08 NOTE — TOC Progression Note (Signed)
 Transition of Care [facility]) - Progression Note    Patient Details  Name: Alison Lopez MRN: [id number] Date of Birth: July 26, 1956  [facility] CM/SW Contact  Cherre Blanc, RN Phone Number: 02/08/2024, 10:22 AM  Clinical Narrative:    Patient to be offered a bed at CIR. CIR nurse will call the daughter to notify. Plan for patient to transition to CIR today. No other TOC needs identified.    Expected Discharge Plan:  (Acute Inpatient rehab v/s SNF) Barriers to Discharge: Insurance Authorization  Expected Discharge Plan and Services   Discharge Planning Services: CM Consult Post Acute Care Choice: IP Rehab, Skilled Nursing Facility Living arrangements for the past 2 months: Single Family Home Expected Discharge Date: 02/08/24                                     Social Determinants of Health (SDOH) Interventions SDOH Screenings   Food Insecurity: No Food Insecurity (01/29/2024)  Housing: Low Risk  (01/29/2024)  Transportation Needs: No Transportation Needs (01/29/2024)  Utilities: Not At Risk (01/29/2024)  Depression (PHQ2-9): Low Risk  (01/21/2024)  Social Connections: Socially Isolated (01/29/2024)  Tobacco Use: Medium Risk (01/29/2024)    Readmission Risk Interventions     No data to display

## 2024-02-08 NOTE — Progress Notes (Signed)
 Per Dr Allena Katz, dc NIH stroke scale orders

## 2024-02-08 NOTE — Progress Notes (Signed)
 Report called to [facility]

## 2024-02-08 NOTE — Progress Notes (Signed)
 Inpatient Rehabilitation Admission Medication Review by a Pharmacist  A complete drug regimen review was completed for this patient to identify any potential clinically significant medication issues.  High Risk Drug Classes Is patient taking? Indication by Medication  Antipsychotic No   Anticoagulant Yes Lovenox - VTE ppx  Antibiotic No   Opioid No   Antiplatelet No   Hypoglycemics/insulin Yes Insulin - DM  Vasoactive Medication No   Chemotherapy No   Other Yes Donepezil - dementia Levothyroxine - low thyroid Rosuvastatin - HLD     Type of Medication Issue Identified Description of Issue Recommendation(s)  Drug Interaction(s) (clinically significant)     Duplicate Therapy     Allergy     No Medication Administration End Date     Incorrect Dose     Additional Drug Therapy Needed     Significant med changes from prior encounter (inform family/care partners about these prior to discharge). Eliquis held for SDH  Namenda, bupropion, metformin, losartan, flexeril stopped on discharge from [facility]   Other       Clinically significant medication issues were identified that warrant physician communication and completion of prescribed/recommended actions by midnight of the next day:  No  Name of provider notified for urgent issues identified:   Provider Method of Notification:     Pharmacist comments: Resume Sertraline 25 mg po daily if appropriate    Time spent performing this drug regimen review (minutes):  20 minutes   Thank you Okey Regal, PharmD 02/08/2024 12:39 PM

## 2024-02-08 NOTE — Plan of Care (Signed)
  Problem: RH BOWEL ELIMINATION Goal: RH STG MANAGE BOWEL WITH ASSISTANCE Description: STG Manage Bowel with toileting Assistance. Outcome: Progressing Goal: RH STG MANAGE BOWEL W/MEDICATION W/ASSISTANCE Description: STG Manage Bowel with Medication with mod I Assistance. Outcome: Progressing   Problem: RH BLADDER ELIMINATION Goal: RH STG MANAGE BLADDER WITH ASSISTANCE Description: STG Manage Bladder With toileting Assistance Outcome: Progressing   Problem: RH SAFETY Goal: RH STG ADHERE TO SAFETY PRECAUTIONS W/ASSISTANCE/DEVICE Description: STG Adhere to Safety Precautions With cues Assistance/Device. Outcome: Progressing   Problem: RH PAIN MANAGEMENT Goal: RH STG PAIN MANAGED AT OR BELOW PT'S PAIN GOAL Description: Pain manage < 4 with prns Outcome: Progressing   Problem: RH KNOWLEDGE DEFICIT Goal: RH STG INCREASE KNOWLEDGE OF HYPERTENSION Description: Patient and family will be able to manage HTN using educational resources for medications and dietary modification independently Outcome: Progressing Goal: RH STG INCREASE KNOWLEGDE OF HYPERLIPIDEMIA Description: Patient and family will be able to manage HLD using educational resources for medications and dietary modification independently Outcome: Progressing Goal: RH STG INCREASE KNOWLEDGE OF STROKE PROPHYLAXIS Description: Patient and family will be able to manage secondary risks using educational resources for medications and dietary modification independently Outcome: Progressing   Problem: Consults Goal: RH STROKE PATIENT EDUCATION Description: See Patient Education module for education specifics  Outcome: Progressing

## 2024-02-08 NOTE — H&P (Signed)
 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  Per chart review patient lives with her children.  4 steps to entry of home.  Ambulates household distances without assistive device.  Presented 01/29/2024 to [facility] with altered mental status, right-sided weakness and urinary/fecal incontinence.  Family denied any recent fall or trauma.  Cranial CT scan positive for bilateral mixed density subdural hematoma somewhat biconvex with a 17 mm maximum thickness on the left and 14 mm maximum thickness on the right.  Subsequent mass effect of both hemispheres although no significant midline shift.  Basilar cisterns remain patent.  No skull fracture or other acute intracranial abnormality.  Admission chemistries unremarkable except glucose 135, alcohol negative, platelets 52,000.  Underwent bilateral frontal burr hole for drainage of subdural hematoma 01/30/2024 per Dr. Marcell Barlow.  Latest follow-up cranial CT scan 01/31/2024 showed postoperative substantial decrease in bilateral subdural hematomas.  Residual blood up to 11 mm on the left, 4-5 mm on the right.  Significantly regressed intracranial mass effect.   Residual rightward midline shift now 1-2 mm.  .  Therapy evaluations completed due to patient decreased functional mobility and right-sided weakness was admitted for a comprehensive rehab program.   Review of Systems  Constitutional:  Negative for chills and fever.  HENT:  Negative for hearing loss.   Eyes:  Negative for blurred vision and double vision.  Respiratory:  Negative for cough, shortness of breath and  wheezing.   Cardiovascular:  Negative for chest pain, palpitations and leg swelling.  Gastrointestinal:  Positive for constipation.       GERD  Musculoskeletal:  Positive for joint pain.  Skin:  Negative for rash.  Neurological:  Positive for weakness.  Psychiatric/Behavioral:  Positive for depression and memory loss. The patient has insomnia.        Anxiety  All other systems reviewed and are negative.       Past Medical History:  Diagnosis Date   Anxiety     Arthritis     Collagen vascular disease (HCC)     COPD (chronic obstructive pulmonary disease) (HCC)     DDD (degenerative disc disease), lumbar     Dementia (HCC)     Depression     Diabetes mellitus without complication (HCC)     DVT (deep venous thrombosis) (HCC)     Fibromyalgia     GERD (gastroesophageal reflux disease)     Graves' disease with exophthalmos     HLD (hyperlipidemia)     Hypertension     Hypothyroidism     Lupus     Osteoporosis               Past Surgical History:  Procedure Laterality Date   ABDOMINAL HYSTERECTOMY       BURR HOLE Bilateral 01/30/2024    Procedure: CREATION, CRANIAL BURR HOLE, SUBDURAL HEMATOMA EVACUATIOIN;  Surgeon: Venetia Night, MD;  Location: [facility] ORS;  Service: Neurosurgery;  Laterality: Bilateral;  need ventriculostomy x 2   COLONOSCOPY WITH PROPOFOL N/A 06/11/2021    Procedure: COLONOSCOPY WITH PROPOFOL;  Surgeon: Regis Bill, MD;  Location: [facility];  Service: Endoscopy;  Laterality: N/A;  prefers  afternoon Eliquis   COLONOSCOPY WITH PROPOFOL N/A 12/19/2022    Procedure: COLONOSCOPY WITH PROPOFOL;  Surgeon: Regis Bill, MD;  Location: [facility];  Service: Endoscopy;  Laterality: N/A;   ESOPHAGOGASTRODUODENOSCOPY N/A 06/11/2021    Procedure: ESOPHAGOGASTRODUODENOSCOPY (EGD);  Surgeon: Regis Bill, MD;  Location: [facility];  Service: Endoscopy;  Laterality: N/A;   EYE SURGERY Right     IVC FILTER REMOVAL N/A 07/21/2019     Procedure: IVC FILTER REMOVAL;  Surgeon: Annice Needy, MD;  Location: [facility];  Service: Cardiovascular;  Laterality: N/A;   LOWER EXTREMITY INTERVENTION Right 04/22/2019    Procedure: IVC Filter Insertion with Right Lower Extremity Venous Lysis;  Surgeon: Annice Needy, MD;  Location: [facility];  Service: Cardiovascular;  Laterality: Right;   OOPHORECTOMY                 Family History  Problem Relation Age of Onset   Stomach cancer Mother     Heart attack Father     Breast cancer Neg Hx          Social History:  reports that she quit smoking about 23 years ago. Her smoking use included cigarettes. She started smoking about 40 years ago. She has a 17 pack-year smoking history. She has never used smokeless tobacco. She reports that she does not currently use alcohol. She reports that she does not use drugs. Allergies:  Allergies       Allergies  Allergen Reactions   Morphine And Codeine Anaphylaxis   Montelukast        Other reaction(s): Other (See Comments) Nightmares   Antihistamines, Diphenhydramine-Type Nausea Only   Aspirin Other (See Comments)      Patient on blood thinners and was told not to take   Benadryl [Diphenhydramine Hcl] Hives   Codeine Other (See Comments)      AMS            Medications Prior to Admission  Medication Sig Dispense Refill   apixaban (ELIQUIS) 5 MG TABS tablet Take 1 tablet (5 mg total) by mouth 2 (two) times daily. 60 tablet 2   ascorbic acid (VITAMIN C) 100 MG tablet Take 1,000 mg by mouth daily.       buPROPion (WELLBUTRIN SR) 150 MG 12 hr tablet Take 150 mg by mouth daily.       cyanocobalamin (VITAMIN B12) 1000 MCG tablet Take 1 tablet (1,000 mcg total) by mouth daily. 90 tablet 1   ergocalciferol (VITAMIN D2) 1.25 MG (50000 UT) capsule Take 50,000 Units by mouth every Friday.       levothyroxine (SYNTHROID) 125 MCG tablet Take 125 mcg by mouth daily before breakfast.       rosuvastatin (CRESTOR) 20 MG tablet Take  20 mg by mouth daily.       acetaminophen (TYLENOL) 650 MG CR tablet Take 650 mg by mouth every 8 (eight) hours as needed for pain. (Patient not taking: Reported on 01/26/2024)       cyclobenzaprine (FLEXERIL) 5 MG tablet Take 5 mg by mouth 3 (three) times daily as needed for muscle spasms. (Patient not taking: Reported on 01/21/2024)       donepezil (ARICEPT) 10 MG tablet Take 10 mg by mouth at bedtime. (Patient not taking: Reported on 01/31/2024)       ferrous sulfate 325 (65 FE) MG tablet Take by mouth.       losartan (COZAAR) 50 MG tablet Take 50 mg by  mouth daily. prn (Patient not taking: Reported on 01/31/2024)       memantine (NAMENDA) 5 MG tablet Take 5 mg by mouth daily. (Patient not taking: Reported on 01/31/2024)       metFORMIN (GLUCOPHAGE) 500 MG tablet Take 500 mg by mouth daily.  (Patient not taking: Reported on 01/31/2024)       sertraline (ZOLOFT) 25 MG tablet Take 25 mg by mouth daily. (Patient not taking: Reported on 01/31/2024)                  Home: Home Living Family/patient expects to be discharged to:: Skilled nursing facility Living Arrangements: Children Available Help at Discharge: Family, Available PRN/intermittently Type of Home: House Home Access: Stairs to enter Secretary/administrator of Steps: 4 Entrance Stairs-Rails: Right Home Equipment: Rollator (4 wheels) Additional Comments: information from chart, will need to confirm  Lives With: Daughter   Functional History: Prior Function Prior Level of Function : Needs assist  Cognitive Assist :  (baseline dementia) Mobility Comments: per chart, pt amb household distances ADLs Comments: per chart, pt requires assist for bathing, all IADLs;   Functional Status:  Mobility: Bed Mobility Overal bed mobility: Needs Assistance Bed Mobility: Supine to Sit Rolling: Min assist Supine to sit: Min assist Sit to supine: Min assist General bed mobility comments: Pt requires assistance for her right side and  cues Transfers Overall transfer level: Needs assistance Equipment used: 1 person hand held assist Transfers: Sit to/from Stand Sit to Stand: Min assist General transfer comment: L hand held assist and steadying for balance Ambulation/Gait Ambulation/Gait assistance: Min assist Gait Distance (Feet): 200 Feet Assistive device: 1 person hand held assist Gait Pattern/deviations: Step-through pattern, Decreased step length - right, Decreased step length - left, Narrow base of support, Decreased stride length General Gait Details: patient doing well with ambulation. Requires cues for direction due to poor vision. Follows 1 step direction with increased time and inconsistently. Able to increase distance significantly this session. Patient requires heavy cues for safety, ADLs, ambulation. Poor vision and cognition. Gait velocity: decreased Pre-gait activities: with physical assistance, patient is able to take 3 side steps to the right with physical assistance for advancement of RLE. weight shifting faciliation to the left  movement of RLE with active hip movement noted. patient relying on the bed for posterior leg support   ADL: ADL Overall ADL's : Needs assistance/impaired Eating/Feeding: Set up Grooming: Wash/dry face, Brushing hair, Standing, Oral care Upper Body Dressing : Maximal assistance Upper Body Dressing Details (indicate cue type and reason): anticipate Lower Body Dressing: Maximal assistance Lower Body Dressing Details (indicate cue type and reason): donn ted hose/socks, raised LLE to assist Toilet Transfer: Regular Toilet, Ambulation, Cueing for safety, Cueing for sequencing, +2 for safety/equipment (HHA) Toileting- Clothing Manipulation and Hygiene: Maximal assistance, Sit to/from stand Functional mobility during ADLs: +2 for safety/equipment (HHA) General ADL Comments: Pt completed standing grooming tasks at sink level then sat to complete oral care for additional stability. Pt  benefited from moderate verbal and visual cues for tasks sequence.   Cognition: Cognition Overall Cognitive Status: Impaired/Different from baseline Arousal/Alertness: Awake/alert Orientation Level: Oriented to person, Disoriented to place, Disoriented to time, Disoriented to situation Year:  (required binary options) Month:  (required cues) Day of Week: Correct Attention: Sustained Sustained Attention: Impaired Sustained Attention Impairment: Verbal basic Memory: Impaired Memory Impairment: Decreased recall of new information, Storage deficit Awareness: Impaired Awareness Impairment: Intellectual impairment Problem Solving: Impaired Problem Solving Impairment: Verbal basic Executive Function:  Organizing Organizing: Impaired Organizing Impairment: Verbal basic Behaviors:  (none) Safety/Judgment:  (emerging- reports concern for fall if she attempted to stand) Cognition Arousal: Alert Behavior During Therapy: WFL for tasks assessed/performed Overall Cognitive Status: Impaired/Different from baseline   Physical Exam: Blood pressure (!) 128/52, pulse (!) 53, temperature (!) 97.4 F (36.3 C), resp. rate 18, height 4\' 9"  (1.448 m), weight 49.9 kg, SpO2 97%. Physical Exam HENT:     Head:     Comments: Burr hole site clean and dry    Mouth/Throat:     Comments: Poor dentition Neurological:     Comments: Patient is alert.  Provides name and age but showed decrease in awareness and attention.    General: No acute distress Mood and affect are appropriate Heart: Regular rate and rhythm no rubs murmurs or extra sounds Lungs: Clear to auscultation, breathing unlabored, no rales or wheezes Abdomen: Positive bowel sounds, soft nontender to palpation, nondistended Extremities: No clubbing, cyanosis, or edema Skin: No evidence of breakdown, no evidence of rash Neurologic: Dense right homonymous hemianopsia ,motor strength is 4/5 in bilateral deltoid, bicep, tricep, grip, hip flexor,  knee extensors, ankle dorsiflexor and plantar flexor Sensory exam normal sensation to light touch and  in bilateral upper and lower extremities Cerebellar exam normal finger to nose to finger as well as heel to shin in left upper and lower extremities Right sided testing limited by dense right homonymous hemianopsia Musculoskeletal: Full range of motion in all 4 extremities. No joint swelling      Lab Results Last 48 Hours        Results for orders placed or performed during the hospital encounter of 01/29/24 (from the past 48 hours)  Glucose, capillary     Status: Abnormal    Collection Time: 02/06/24 11:39 AM  Result Value Ref Range    Glucose-Capillary 208 (H) 70 - 99 mg/dL      Comment: Glucose reference range applies only to samples taken after fasting for at least 8 hours.  Glucose, capillary     Status: Abnormal    Collection Time: 02/06/24  4:58 PM  Result Value Ref Range    Glucose-Capillary 108 (H) 70 - 99 mg/dL      Comment: Glucose reference range applies only to samples taken after fasting for at least 8 hours.  Glucose, capillary     Status: Abnormal    Collection Time: 02/06/24  9:02 PM  Result Value Ref Range    Glucose-Capillary 125 (H) 70 - 99 mg/dL      Comment: Glucose reference range applies only to samples taken after fasting for at least 8 hours.  CBC     Status: Abnormal    Collection Time: 02/07/24  4:43 AM  Result Value Ref Range    WBC 6.7 4.0 - 10.5 K/uL    RBC 3.64 (L) 3.87 - 5.11 MIL/uL    Hemoglobin 11.2 (L) 12.0 - 15.0 g/dL    HCT 16.1 (L) 09.6 - 46.0 %    MCV 89.0 80.0 - 100.0 fL    MCH 30.8 26.0 - 34.0 pg    MCHC 34.6 30.0 - 36.0 g/dL    RDW 04.5 40.9 - 81.1 %    Platelets 69 (L) 150 - 400 K/uL      Comment: Immature Platelet Fraction may be clinically indicated, consider ordering this additional test [id number]      nRBC 0.0 0.0 - 0.2 %      Comment: Performed at [facility],  [address], [place], [place] [address]  Basic  metabolic panel     Status: Abnormal    Collection Time: 02/07/24  4:43 AM  Result Value Ref Range    Sodium 137 135 - 145 mmol/L    Potassium 4.7 3.5 - 5.1 mmol/L    Chloride 104 98 - 111 mmol/L    CO2 28 22 - 32 mmol/L    Glucose, Bld 98 70 - 99 mg/dL      Comment: Glucose reference range applies only to samples taken after fasting for at least 8 hours.    BUN 21 8 - 23 mg/dL    Creatinine, Ser 5.78 (H) 0.44 - 1.00 mg/dL    Calcium 8.7 (L) 8.9 - 10.3 mg/dL    GFR, Estimated >46 >96 mL/min      Comment: (NOTE) Calculated using the CKD-EPI Creatinine Equation (2021)      Anion gap 5 5 - 15      Comment: Performed at [facility], [address] [place], [place] [address]  Glucose, capillary     Status: Abnormal    Collection Time: 02/07/24  9:30 AM  Result Value Ref Range    Glucose-Capillary 101 (H) 70 - 99 mg/dL      Comment: Glucose reference range applies only to samples taken after fasting for at least 8 hours.  Glucose, capillary     Status: Abnormal    Collection Time: 02/07/24 12:22 PM  Result Value Ref Range    Glucose-Capillary 179 (H) 70 - 99 mg/dL      Comment: Glucose reference range applies only to samples taken after fasting for at least 8 hours.  Glucose, capillary     Status: None    Collection Time: 02/07/24  4:36 PM  Result Value Ref Range    Glucose-Capillary 82 70 - 99 mg/dL      Comment: Glucose reference range applies only to samples taken after fasting for at least 8 hours.  Glucose, capillary     Status: Abnormal    Collection Time: 02/07/24  9:21 PM  Result Value Ref Range    Glucose-Capillary 103 (H) 70 - 99 mg/dL      Comment: Glucose reference range applies only to samples taken after fasting for at least 8 hours.  Glucose, capillary     Status: None    Collection Time: 02/08/24  8:50 AM  Result Value Ref Range    Glucose-Capillary 89 70 - 99 mg/dL      Comment: Glucose reference range applies only to samples taken after fasting for at  least 8 hours.      Imaging Results (Last 48 hours)  No results found.         Blood pressure (!) 128/52, pulse (!) 53, temperature (!) 97.4 F (36.3 C), resp. rate 18, height 4\' 9"  (1.448 m), weight 49.9 kg, SpO2 97%.   Medical Problem List and Plan: 1. 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  Status post bur hole 01/30/2024.   Plan to remove staples/sutures 02/13/19/2025, patient with dense right homonymous hemianopsia.  Cognitive deficits question due to dementia versus worsening due to traumatic brain injury             -patient may not shower suture removal 02/13/2024             -ELOS/Goals: 12 to 14 days with supervision to min assist goals cognitively and supervision goals 2.  Antithrombotics: -DVT/anticoagulation:  Pharmaceutical:  Lovenox initiated 01/31/2024.Marland Kitchen  History of DVT maintained on Eliquis held due to SDH.             -antiplatelet therapy: N/A 3. Pain Management: N/A 4. Mood/Behavior/Sleep: Zoloft 25 mg daily, Aricept 10 mg nightly Namenda 5 mg daily.             -antipsychotic agents: N/A 5. Neuropsych/cognition: This patient is not capable of making decisions on her own behalf. 6. Skin/Wound Care: Routine skin checks 7. Fluids/Electrolytes/Nutrition: Routine in and outs with follow-up chemistries 8.  Diabetes mellitus with retinopathy/legally blind.  Latest hemoglobin A1c 4.8.  SSI. 9.  Hyperlipidemia.  Crestor 10.  Hypothyroidism.  Synthroid 11.  Chronic thrombocytopenia.  Patient received Decadron 40 mg IV daily 4 days.  Follow-up hematology services.  Admission platelets of 52,000.  Latest platelet count 69,000. Would hold enoxaparin if platelet count less than 40,000 12.  B12 deficiency.  Continue supplement       Mcarthur Rossetti Angiulli, PA-C "I have personally performed a face to face diagnostic evaluation of this patient.  Additionally, I have reviewed and concur with the physician assistant's documentation  above." Erick Colace M.D. [facility] Fellow Am [employer] of [employer] Med and Rehab Diplomate Am Board of Electrodiagnostic Med Fellow Am Board of Interventional Pain

## 2024-02-08 NOTE — Discharge Summary (Signed)
 Physician Discharge Summary  Patient ID: Alison Lopez MRN: [id number] DOB/AGE: Nov 10, 1956 68 y.o.  Admit date: 02/08/2024 Discharge date: 02/15/2024  Discharge Diagnoses:  Principal Problem:   SDH (subdural hematoma) (HCC) History of DVT Mood stabilization Diabetes mellitus with retinopathy/legally blind Hyperlipidemia Hypothyroidism Chronic thrombocytopenia B12 deficiency  Discharged Condition: \Guarded  Significant Diagnostic Studies: CT HEAD WO CONTRAST ( ) Result Date: 01/31/2024 CLINICAL DATA:  68 year old female with altered mental status and bilateral subdural hematomas at presentation. Postoperative now bilateral subdural evacuation. EXAM: CT HEAD WITHOUT CONTRAST TECHNIQUE: Contiguous axial images were obtained from the base of the skull through the vertex without intravenous contrast. RADIATION DOSE REDUCTION: This exam was performed according to the departmental dose-optimization program which includes automated exposure control, adjustment of the mA and/or kV according to patient size and/or use of iterative reconstruction technique. COMPARISON:  Head CT 01/29/2024. FINDINGS: Brain: Postoperative changes to both subdural spaces with bilateral subdural drains in place. Associated pneumocephalus. Decreased volume of right side subdural hematoma, residual now only 4-5 mm at most levels (previously up to 14 mm). Decreased volume of left side subdural hematoma, more mixed density now and residual up to maximum thickness of 11 mm (previously 17 mm). Subsequent decreased intracranial mass effect and improved ventricle size and configuration. Mild rightward midline shift now 1-2 mm (coronal image 35). Small volume left para falcine subdural blood and pneumocephalus also. Basilar cisterns are patent and mildly improved. No IVH or new areas of intracranial hemorrhage. Chronic parietal 0 and occipital lobe encephalomalacia more pronounced on the right. Small chronic left cerebellar  infarcts. No cortically based acute infarct identified. Vascular: Calcified atherosclerosis at the skull base. No suspicious intracranial vascular hyperdensity. Skull: New bilateral vertex burr holes. Sinuses/Orbits: Visualized paranasal sinuses and mastoids are stable and well aerated. Other: Postoperative changes now to the bilateral scalp vertex with percutaneous drains and skin staples in place. Similar leftward gaze. Postoperative changes to the right globe. IMPRESSION: 1. Postoperative substantial decrease in bilateral subdural hematomas with bilateral subdural drains in place. Residual blood up to 11 mm on the Left, 4-5 mm on the Right. 2. Significantly regressed intracranial mass effect. Residual rightward midline shift now 1-2 mm. 3. No new intracranial abnormality. Chronic ischemic disease. Electronically Signed   By: Odessa Fleming M.D.   On: 01/31/2024 05:16   CT Cervical Spine Wo Contrast Result Date: 01/29/2024 CLINICAL DATA:  Right upper and lower extremity weakness. Abrupt change. Neck trauma. EXAM: CT CERVICAL SPINE WITHOUT CONTRAST TECHNIQUE: Multidetector CT imaging of the cervical spine was performed without intravenous contrast. Multiplanar CT image reconstructions were also generated. RADIATION DOSE REDUCTION: This exam was performed according to the departmental dose-optimization program which includes automated exposure control, adjustment of the mA and/or kV according to patient size and/or use of iterative reconstruction technique. COMPARISON:  None Available. FINDINGS: Alignment: Slight degenerative anterolisthesis is present at C4-5. No other significant listhesis is present. Straightening of the normal cervical lordosis is present. Rightward curvature of the cervical spine is centered at C6. Skull base and vertebrae: The craniocervical junction is normal. Vertebral body heights are normal. No acute fractures are present. Soft tissues and spinal canal: The soft tissues of the neck are  otherwise unremarkable. Salivary glands are within normal limits. Thyroid is normal. No significant adenopathy is present. No focal mucosal or submucosal lesions are present. Disc levels: Chronic endplate degenerative changes are present at C5-6. Uncovertebral spurring leads to moderate left and mild right foraminal stenosis. 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 Upper chest:  The lung apices are clear. The thoracic inlet is within normal limits. IMPRESSION: 1. No acute or focal lesion to explain the patient's symptoms. 2. Chronic endplate degenerative changes at C5-6 with moderate left and mild right foraminal stenosis. 3. 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 Electronically Signed   By: Marin Roberts M.D.   On: 01/29/2024 15:08   CT HEAD WO CONTRAST Addendum Date: 01/29/2024 ADDENDUM REPORT: 01/29/2024 13:05 ADDENDUM: Study discussed by telephone with Dr. Artis Delay on 01/29/2024 at 1259 hours. Electronically Signed   By: Odessa Fleming M.D.   On: 01/29/2024 13:05   Result Date: 01/29/2024 CLINICAL DATA:  68 year old female with abrupt onset altered mental status, incontinence, not moving right arm. History of dementia. EXAM: CT HEAD WITHOUT CONTRAST TECHNIQUE: Contiguous axial images were obtained from the base of the skull through the vertex without intravenous contrast. RADIATION DOSE REDUCTION: This exam was performed according to the departmental dose-optimization program which includes automated exposure control, adjustment of the mA and/or kV according to patient size and/or use of iterative reconstruction technique. COMPARISON:  Head CT 02/13/2005. FINDINGS: Brain: New since 2006 bihemispheric hypodense subdural hematomas with intracranial mass effect. Both collections are fairly uniformly hypodense throughout much of the hemisphere but there is layering hyperdense blood posteriorly on both sides (series 2, image 17). Both collections are somewhat biconvex. That on the left measures up to 17 mm in  maximum thickness (coronal image 42), on the right 14 mm in maximum thickness. Questionable trace para falcine hyperdense blood products also on the left coronal image 17. But no other convincing multi spatial hemorrhage. Subsequently there is no significant midline shift. Mass effect on the lateral ventricles but the basilar cisterns remain patent. Incidental choroid plexus cysts (normal variant). Chronic appearing encephalomalacia in both occipital poles (series 2, image 15), new since 2006. No cortically based acute infarct identified. And multiple small chronic appearing cerebellar infarcts more numerous on the left. Other patchy bilateral cerebral white matter hypodensity. Vascular: Calcified atherosclerosis at the skull base. No suspicious intracranial vascular hyperdensity. Skull: Intact.  No acute osseous abnormality identified. Sinuses/Orbits: Visualized paranasal sinuses and mastoids are stable and well aerated. Other: Visualized orbits and scalp soft tissues are within normal limits. IMPRESSION: 1. Positive for bilateral mixed density Subdural Hematomas, somewhat biconvex and with 17 mm maximum thickness on the Left and 14 mm maximum thickness on the Right. Subsequent mass effect on both hemispheres although no significant midline shift. Basilar cisterns remain patent. 2. No skull fracture or other acute intracranial abnormality identified. 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 Electronically Signed: By: Odessa Fleming M.D. On: 01/29/2024 12:57    Labs:  Basic Metabolic Panel: Recent Labs  Lab 02/07/24 0443  NA 137  K 4.7  CL 104  CO2 28  GLUCOSE 98  BUN 21  CREATININE 1.01*  CALCIUM 8.7*    CBC: Recent Labs  Lab 02/07/24 0443  WBC 6.7  HGB 11.2*  HCT 32.4*  MCV 89.0  PLT 69*    CBG: Recent Labs  Lab 02/07/24 1222 02/07/24 1636 02/07/24 2121 02/08/24 0850 02/08/24 1603  GLUCAP 179* 82 103* 89 135*   Family history.  Mother with  stomach cancer father with myocardial infarction.  Denies any colon cancer esophageal cancer or rectal cancer  Brief HPI:   Alison Lopez is a 68 y.o. right-handed female with history significant for DVT maintained on Eliquis, dementia maintained on Aricept, chronic thrombocytopenia followed by Dr.Rao, B12 deficiency, hypertension,  diabetes mellitus with retinopathy and legally blind, quit smoking 23 years ago.  Per chart review patient lives with her children.  4 steps to entry of home.  Ambulates household distances without assistive device.  Presented 01/29/2024 to [facility] with altered mental status right side weakness and urinary/fecal incontinence.  Family denied any recent fall or trauma.  Cranial CT scan positive for bilateral mixed density subdural hematoma somewhat biconvex with a 17 mm maximum thickness on the left and 14 mm maximum thickness on the right.  Subsequent mass effect of both hemispheres although no significant midline shift.  Basilar cisterns remained patent.  No skull fracture or other acute intracranial abnormality.  Admission chemistries unremarkable except glucose 135 alcohol negative platelets 52,000.  Underwent bilateral frontal burr hole for drainage of subdural hematoma 01/30/2024 per Dr. Marcell Barlow.  Latest follow-up cranial CT scan 01/31/2024 showed postoperative substantial decrease in bilateral subdural hematomas.  Residual blood up to 11 mm on the left, 4-5 mm on the right.  Significantly regressed intracranial mass effect.  Residual rightward midline shift now 1 to 2 mm.  Therapy evaluations completed due to patient decreased functional mobility and right side weakness was admitted for a comprehensive rehab program.   Hospital Course: Alison Lopez was admitted to rehab 02/08/2024 for inpatient therapies to consist of PT, ST and OT at least three hours five days a week. Past admission physiatrist, therapy team and rehab RN have worked together to  provide customized collaborative inpatient rehab.  Pertaining to patient's bilateral left greater than right subdural hematoma.  Status post bur hole 01/30/2024.  Marland Kitchen  She did display some dense right homonymous hemianopsia.  History of DVT chronic Eliquis remained on hold Lovenox was initiated 01/31/2024 for DVT prophylaxis.  No bleeding episodes.  Mood stabilization with history of dementia she continued on her Zoloft Aricept as well as Namenda.  She was participating with therapies.  Blood sugars overall controlled with history of diabetes mellitus retinopathy she was legally blind hemoglobin A1c 4.8.  Crestor for hyperlipidemia as directed.  Hormone supplement for hypothyroidism ongoing.  Chronic thrombocytopenia follow-up hematology services outpatient admission platelets 52,000 monitoring of CBC during hospitalization.  B12 deficiency with supplement as directed.  On 02/15/2024 patient displayed some increased confusion and inattention cranial CT scan completed showing slightly increased mixed attenuation subdural collection over the left cerebral convexity measuring up to 13 mm, previously 11 mm.  New focus of acute blood products posteriorly over the left parietal lobe measuring up to 5 mm in thickness.  Additional acute blood products over the posterior left occipital lobe measuring up to 8 mm in thickness.  Overall similar mass effect with approximately 2 mm rightward midline shift.  Neurosurgery Dr. Marcell Barlow contacted did not feel any surgical intervention was needed recommended follow-up CT scan and due to these findings patient was discharged to acute care services for input and monitoring.   Blood pressures were monitored on TID basis and remained controlled and monitored  Diabetes has been monitored with ac/hs CBG checks and SSI was use prn for tighter BS control.    Rehab course: During patient's stay in rehab weekly team conferences were held to monitor patient's progress, set goals and discuss  barriers to discharge. At admission, patient required minimal assist 200 feet 1 person hand-held assist minimal assist sit to stand  Physical exam.  Blood pressure 128/52 pulse 53 temperature 97.4 respirations 18 oxygen saturations 97% room air Constitutional.  No acute distress HEENT Head.  Bur hole site clean and dry.  Poor dentition Neurologic.  Alert provides name and age but showed some decreased awareness and attention.  Dense right homonymous hemianopsia Eyes.  Pupils sluggish to react to light.  No nystagmus Neck.  Supple nontender no JVD without thyromegaly Cardiac regular rate and rhythm without any extra sounds or murmur heard Abdomen.  Soft nontender positive bowel sounds without rebound Respiratory effort normal no respiratory distress without wheeze Motor strength.  4/5 in bilateral deltoid bicep tricep grip hip flexors knee extensors ankle dorsi plantarflexion  He/She  has had improvement in activity tolerance, balance, postural control as well as ability to compensate for deficits. He/She has had improvement in functional use RUE/LUE  and RLE/LLE as well as improvement in awareness       Disposition: 68-another health care institution not defined    Diet: Regular consistency diet  Special Instructions: No driving smoking or alcohol  Continue to hold chronic Eliquis due to subdural hematoma  Medications at discharge .1.  Vitamin C 1000 mg p.o. daily 2.  Vitamin B12 1000 mcg p.o. daily 3.  Aricept 10 mg p.o. nightly 4.  Ferrous sulfate 325 mg p.o. daily 5.  Synthroid 125 mcg p.o. daily 6.  MiraLAX daily hold for loose stools 7.  Crestor 20 mg p.o. daily 8.  Zoloft 25 mg p.o. daily  30-35 minutes were spent completing discharge summary and discharge planning    Follow-up Information     Angelina Sheriff, DO Follow up.   Specialty: Physical Medicine and Rehabilitation Why: Office to call for appointment Contact information: [address] [place] [address] [phone]         Venetia Night, MD Follow up.   Specialty: Neurosurgery Why: Call for appointment Contact information: [address] [place] [address] [phone]                 Signed: Charlton Amor 02/08/2024, 5:53 PM

## 2024-02-08 NOTE — Progress Notes (Signed)
 Alison Lopez, Alison Sparrow, MD  Physician Physical Medicine and Rehabilitation   PMR Pre-admission    Signed   Date of Service: 02/08/2024 10:38 AM  Related encounter: ED to Hosp-Admission (Discharged) from 01/29/2024 in [facility] 1C MEDICAL TELEMETRY   Signed     Expand All Collapse All  PMR Admission Coordinator Pre-Admission Assessment   Patient: Alison Lopez is an 68 y.o., female MRN: [id number] DOB: 1956/03/11 Height: 4\' 9"  (144.8 cm) Weight: 49.9 kg   Insurance Information HMO: yes    PPO:      PCP:      IPA:      80/20:      OTHER:  [employer] HMO      Policy#: [id number]      Subscriber: pt CM Name: Denny Peon      Phone#: [phone] ext  [phone]    Fax#: [phone] Pre-Cert#: [id number] auth from [place] with expedited appeals with updates due to fax listed above on 4/7      Employer:  Benefits:  Phone #: [phone]     Name:  Eff. Date: 11/11/23     Deduct: $257 (met)      Out of Pocket Max: [phone] ($375 met)      Life Max: n/a CIR: $2185/admit      SNF: 20 full days Outpatient: 80%     Co-Pay: 20% Home Health: 100%      Co-Pay:  DME: 80%     Co-Pay: 20% Providers:  SECONDARY: Medicaid [place]      Policy#: [id number] k     Phone#:    Financial Counselor:       Phone#:    The "Data Collection Information Summary" for patients in Inpatient Rehabilitation Facilities with attached "Privacy Act Statement-Health Care Records" was provided and verbally reviewed with: Patient and Family   Emergency Contact Information Contact Information       Name Relation Home Work Mobile    [place] Daughter     [phone]    Prudencio Burly     [phone]         Other Contacts       Name Relation Home Work Mobile    [place] (Chop) Clearence Cheek     [phone]           Current Medical History  Patient Admitting Diagnosis:  bilat SDH s/p burr holes   History of Present Illness: Alison Lopez is a 68 year old  right-handed female with history of DVT on Eliquis, dementia maintained on Aricept, chronic thrombocytopenia, B12 deficiency, hypertension, diabetes mellitus.  Presented 01/29/2024 to [facility] with altered mental status, right-sided weakness and urinary/fecal incontinence.  Family denied any known recent fall or trauma.  Cranial CT scan positive for bilateral mixed density subdural hematoma somewhat biconvex with a 17 mm maximum thickness on the left and 14 mm maximum thickness on the right.  Subsequent mass effect of both hemispheres although no significant midline shift.  Basilar cisterns remain patent.  No skull fracture or other acute intracranial abnormality.  Admission chemistries unremarkable except glucose 135, alcohol negative, platelets 52,000.  Underwent bilateral frontal burr hole for drainage of subdural hematoma 01/30/2024 per Dr. Marcell Barlow.  Latest follow-up cranial CT scan 01/31/2024 showed postoperative substantial decrease in bilateral subdural hematomas.  Residual blood up to 11 mm on the left, 4-5 mm on the right.  Significantly regressed intracranial mass effect.   Residual rightward midline shift now 1-2 mm.  Follow-up cranial  CT scan 02/08/2024 pending.  Therapy evaluations completed and pt was recommended for a comprehensive rehab program.     Complete NIHSS TOTAL: 11   Patient's medical record from [facility] has been reviewed by the rehabilitation admission coordinator and physician.   Past Medical History      Past Medical History:  Diagnosis Date   Anxiety     Arthritis     Collagen vascular disease (HCC)     COPD (chronic obstructive pulmonary disease) (HCC)     DDD (degenerative disc disease), lumbar     Dementia (HCC)     Depression     Diabetes mellitus without complication (HCC)     DVT (deep venous thrombosis) (HCC)     Fibromyalgia     GERD (gastroesophageal reflux disease)     Graves' disease with exophthalmos     HLD (hyperlipidemia)      Hypertension     Hypothyroidism     Lupus     Osteoporosis            Has the patient had major surgery during 100 days prior to admission? Yes   Family History   family history includes Heart attack in her father; Stomach cancer in her mother.   Current Medications  Current Medications    Current Facility-Administered Medications:    ascorbic acid (VITAMIN C) tablet 1,000 mg, 1,000 mg, Oral, Daily, Venetia Night, MD, 1,000 mg at 02/08/24 0846   Chlorhexidine Gluconate Cloth 2 % PADS 6 each, 6 each, Topical, Q0600, Venetia Night, MD, 6 each at 02/07/24 1610   cyanocobalamin (VITAMIN B12) tablet 1,000 mcg, 1,000 mcg, Oral, Daily, Venetia Night, MD, 1,000 mcg at 02/08/24 0846   donepezil (ARICEPT) tablet 10 mg, 10 mg, Oral, QHS, Venetia Night, MD, 10 mg at 02/07/24 2153   enoxaparin (LOVENOX) injection 40 mg, 40 mg, Subcutaneous, Q24H, Enedina Finner, MD, 40 mg at 02/07/24 1313   ferrous sulfate tablet 325 mg, 325 mg, Oral, Q breakfast, Venetia Night, MD, 325 mg at 02/08/24 0846   hydrALAZINE (APRESOLINE) injection 5 mg, 5 mg, Intravenous, Q4H PRN, Venetia Night, MD   insulin aspart (novoLOG) injection 0-5 Units, 0-5 Units, Subcutaneous, QHS, Venetia Night, MD, 2 Units at 01/29/24 2123   insulin aspart (novoLOG) injection 0-9 Units, 0-9 Units, Subcutaneous, TID WC, Venetia Night, MD, 2 Units at 02/07/24 1312   levothyroxine (SYNTHROID) tablet 125 mcg, 125 mcg, Oral, Q0600, Venetia Night, MD, 125 mcg at 02/08/24 0606   Oral care mouth rinse, 15 mL, Mouth Rinse, PRN, Wouk, Wilfred Curtis, MD   polyethylene glycol (MIRALAX / GLYCOLAX) packet 17 g, 17 g, Oral, Daily, Wouk, Wilfred Curtis, MD, 17 g at 02/06/24 9604   rosuvastatin (CRESTOR) tablet 20 mg, 20 mg, Oral, Daily, Venetia Night, MD, 20 mg at 02/08/24 5409   senna (SENOKOT) tablet 8.6 mg, 1 tablet, Oral, Daily, Wouk, Wilfred Curtis, MD, 8.6 mg at 02/08/24 8119     Patients Current Diet:   Diet Order                  Diet - low sodium heart healthy             Diet Heart Room service appropriate? Yes; Fluid consistency: Thin  Diet effective now                         Precautions / Restrictions Precautions Precautions: Fall Restrictions Weight Bearing Restrictions Per Provider Order: No  Has the patient had 2 or more falls or a fall with injury in the past year? Unknown   Prior Activity Level Household: household ambulatory with rollator, assist for ADLs and iADLs, lives with daughter who drives her to appointments   Prior Functional Level Self Care: Did the patient need help bathing, dressing, using the toilet or eating? Needed some help   Indoor Mobility: Did the patient need assistance with walking from room to room (with or without device)? Independent   Stairs: Did the patient need assistance with internal or external stairs (with or without device)? Needed some help   Functional Cognition: Did the patient need help planning regular tasks such as shopping or remembering to take medications? Needed some help   Patient Information Are you of Hispanic, Latino/a,or Spanish origin?: A. No, not of Hispanic, Latino/a, or Spanish origin, X. Patient unable to respond (all answers via proxy) What is your race?: A. White, X. Patient unable to respond Do you need or want an interpreter to communicate with a doctor or health care staff?: 9. Unable to respond   Patient's Response To:  Health Literacy and Transportation Is the patient able to respond to health literacy and transportation needs?: No Health Literacy - How often do you need to have someone help you when you read instructions, pamphlets, or other written material from your doctor or pharmacy?: Patient unable to respond In the past 12 months, has lack of transportation kept you from medical appointments or from getting medications?: No In the past 12 months, has lack of transportation kept you from  meetings, work, or from getting things needed for daily living?: No   Home Assistive Devices / Equipment Home Equipment: Rollator (4 wheels)   Prior Device Use: Indicate devices/aids used by the patient prior to current illness, exacerbation or injury? Walker   Current Functional Level Cognition   Arousal/Alertness: Awake/alert Overall Cognitive Status: Impaired/Different from baseline Orientation Level: Oriented to person, Disoriented to place, Disoriented to time, Disoriented to situation Attention: Sustained Sustained Attention: Impaired Sustained Attention Impairment: Verbal basic Memory: Impaired Memory Impairment: Decreased recall of new information, Storage deficit Awareness: Impaired Awareness Impairment: Intellectual impairment Problem Solving: Impaired Problem Solving Impairment: Verbal basic Executive Function: Organizing Organizing: Impaired Organizing Impairment: Verbal basic Behaviors:  (none) Safety/Judgment:  (emerging- reports concern for fall if she attempted to stand)    Extremity Assessment (includes Sensation/Coordination)   Upper Extremity Assessment: RUE deficits/detail (LUE appears WFL) RUE Deficits / Details: trace activiation throughout RUE with multi modal cueing, PROM WFL; pt imparied cognition affects sensation assessment; will continue to assess RUE Coordination: decreased fine motor, decreased gross motor  Lower Extremity Assessment: Defer to PT evaluation RLE Deficits / Details: again little to no AROM or ability to get more than seemingly reflexive responses with trying to activate R LE     ADLs   Overall ADL's : Needs assistance/impaired Eating/Feeding: Set up Grooming: Wash/dry face, Brushing hair, Standing, Oral care Upper Body Dressing : Maximal assistance Upper Body Dressing Details (indicate cue type and reason): anticipate Lower Body Dressing: Maximal assistance Lower Body Dressing Details (indicate cue type and reason): donn ted  hose/socks, raised LLE to assist Toilet Transfer: Regular Toilet, Ambulation, Cueing for safety, Cueing for sequencing, +2 for safety/equipment (HHA) Toileting- Clothing Manipulation and Hygiene: Maximal assistance, Sit to/from stand Functional mobility during ADLs: +2 for safety/equipment (HHA) General ADL Comments: Pt completed standing grooming tasks at sink level then sat to complete oral care for additional stability. Pt benefited  from moderate verbal and visual cues for tasks sequence.     Mobility   Overal bed mobility: Needs Assistance Bed Mobility: Supine to Sit Rolling: Min assist Supine to sit: Min assist Sit to supine: Min assist General bed mobility comments: Pt requires assistance for her right side and cues     Transfers   Overall transfer level: Needs assistance Equipment used: 1 person hand held assist Transfers: Sit to/from Stand Sit to Stand: Min assist General transfer comment: L hand held assist and steadying for balance     Ambulation / Gait / Stairs / Wheelchair Mobility   Ambulation/Gait Ambulation/Gait assistance: Editor, commissioning (Feet): 200 Feet Assistive device: 1 person hand held assist Gait Pattern/deviations: Step-through pattern, Decreased step length - right, Decreased step length - left, Narrow base of support, Decreased stride length General Gait Details: patient doing well with ambulation. Requires cues for direction due to poor vision. Follows 1 step direction with increased time and inconsistently. Able to increase distance significantly this session. Patient requires heavy cues for safety, ADLs, ambulation. Poor vision and cognition. Gait velocity: decreased Pre-gait activities: with physical assistance, patient is able to take 3 side steps to the right with physical assistance for advancement of RLE. weight shifting faciliation to the left  movement of RLE with active hip movement noted. patient relying on the bed for posterior leg support      Posture / Balance Balance Overall balance assessment: Needs assistance Sitting-balance support: Feet supported Sitting balance-Leahy Scale: Fair Postural control: Right lateral lean Standing balance support: Single extremity supported, During functional activity Standing balance-Leahy Scale: Fair Standing balance comment: Requires hands on assist for standing mobility. Poor balance.     Special needs/care consideration Skin burr hole incisions and Behavioral consideration hx of dementia    Previous Home Environment (from acute therapy documentation) Living Arrangements: Children  Lives With: Daughter Available Help at Discharge: Family, Available PRN/intermittently Type of Home: House Home Access: Stairs to enter Entrance Stairs-Rails: Right Entrance Stairs-Number of Steps: 4 Home Care Services: No Additional Comments: information from chart, will need to confirm   Discharge Living Setting Plans for Discharge Living Setting: Lives with (comment) (daughter) Type of Home at Discharge: House Discharge Home Layout: One level Discharge Home Access: Stairs to enter Entrance Stairs-Rails: Right Entrance Stairs-Number of Steps: 4 Discharge Bathroom Shower/Tub: Tub/shower unit Discharge Bathroom Toilet: Standard Discharge Bathroom Accessibility: Yes How Accessible: Accessible via walker Does the patient have any problems obtaining your medications?: No   Social/Family/Support Systems Anticipated Caregiver: daughter Hospital doctor, Anticipated Caregiver's Contact Information: [phone] Ability/Limitations of Caregiver: Hospital doctor works, she is arranging 24/7 coverage Caregiver Availability: 24/7 Discharge Plan Discussed with Primary Caregiver: Yes Is Caregiver In Agreement with Plan?: Yes Does Caregiver/Family have Issues with Lodging/Transportation while Pt is in Rehab?: No   Goals Patient/Family Goal for Rehab: PT/OT supervision, SLP supervision/min assist Expected length of stay:  12-14 days Additional Information: Discharge plan: return to pt's home where she lives with her daughter.  She will have 24/7 arranged by daughter Pt/Family Agrees to Admission and willing to participate: Yes Program Orientation Provided & Reviewed with Pt/Caregiver Including Roles  & Responsibilities: Yes   Decrease burden of Care through IP rehab admission: n/a   Possible need for SNF placement upon discharge:  Not anticipated.  Plan for discharge back to pt's home, daughter is arranging 24/7 supervision.    Patient Condition: I have reviewed medical records from [facility], spoken with CSW, and daughter. I discussed via phone for  inpatient rehabilitation assessment.  Patient will benefit from ongoing PT, OT, and SLP, can actively participate in 3 hours of therapy a day 5 days of the week, and can make measurable gains during the admission.  Patient will also benefit from the coordinated team approach during an Inpatient Acute Rehabilitation admission.  The patient will receive intensive therapy as well as Rehabilitation physician, nursing, social worker, and care management interventions.  Due to bladder management, bowel management, safety, skin/wound care, disease management, medication administration, pain management, and patient education the patient requires 24 hour a day rehabilitation nursing.  The patient is currently min assist with mobility and basic ADLs.  Discharge setting and therapy post discharge at home with home health is anticipated.  Patient has agreed to participate in the Acute Inpatient Rehabilitation Program and will admit today.   Preadmission Screen Completed By:  Stephania Fragmin, PT, DPT 02/08/2024 10:38 AM ______________________________________________________________________   Discussed status with Dr. Wynn Banker on 02/08/24 at 10:38 AM and received approval for admission today.   Admission Coordinator:  Stephania Fragmin, PT, DPT time 10:38 AM Dorna Bloom 02/08/24      Assessment/Plan: Diagnosis: Bilateral SDH Does the need for close, 24 hr/day Medical supervision in concert with the patient's rehab needs make it unreasonable for this patient to be served in a less intensive setting? Yes Co-Morbidities requiring supervision/potential complications: History of DVT on chronic Eliquis, chronic thrombocytopenia, hypertension, diabetes, B12 deficiency, recent burr hole Due to bladder management, bowel management, safety, skin/wound care, disease management, medication administration, pain management, and patient education, does the patient require 24 hr/day rehab nursing? Yes Does the patient require coordinated care of a physician, rehab nurse, PT, OT, and SLP to address physical and functional deficits in the context of the above medical diagnosis(es)? Yes Addressing deficits in the following areas: balance, endurance, locomotion, strength, transferring, bowel/bladder control, bathing, dressing, feeding, grooming, toileting, cognition, and psychosocial support Can the patient actively participate in an intensive therapy program of at least 3 hrs of therapy 5 days a week? Yes The potential for patient to make measurable gains while on inpatient rehab is good Anticipated functional outcomes upon discharge from inpatient rehab: supervision PT, supervision OT, supervision and min assist SLP Estimated rehab length of stay to reach the above functional goals is: 12 to 14 days Anticipated discharge destination: Home 10. Overall Rehab/Functional Prognosis: good     MD Signature: Erick Colace M.D. [facility] Fellow Am Acad of Phys Med and Rehab Diplomate Am Board of Electrodiagnostic Med Fellow Am Board of Interventional Pain           Revision History  Routing History

## 2024-02-08 NOTE — Discharge Summary (Signed)
 Physician Discharge Summary   Patient: Alison Lopez MRN: [id number] DOB: [birth year]  Admit date:     01/29/2024  Discharge date: 02/08/24  Discharge Physician: Enedina Finner   PCP: Rayetta Humphrey, MD   Recommendations at discharge:    Scalp staples to be removed April 5th, 2025  Your ELIQUIS IS discontinued for now till you are seen by Dr Cathie Hoops as out pt follow-up neurosurgery on February 11, 2024 follow-up PCP after your rehab follow-up Dr. 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  Daughter last saw her on Thursday around 5 PM. Patient reports that on Thursday evening, patient went to bed and urinated on herself and defecated on himself. This is not normal for her.    CT head wo contrast: Was read as positive bilateral mixed density subdural hematomas, somewhat biconvex, 17 mm maximum thickness on the left and 14 mm maximum thickness on the right.    Bilateral subdural hematomas [facility]) --Neurosurgery has been consulted by EDP, Dr. Marcell Barlow is aware --Hold Eliquis -- patient is status post bilateral frontal burr hole for drainage of subdural hematoma with subdural drains placed on each side -- CT  scan of the head to be done tomorrow --3/23-- CT head this morning looks stable postop subdural hematoma evacuation. Perm neurosurgery okay to start PT OT and speech. Okay for DVT prophylaxis. --3/24-- working with PT OT. Will transfer out to MedSurg. Okay with neurosurgery. Bilateral drains from the scalp removed. TOC for discharge planning --3/25-- improving right upper and lower extremity weakness. Seen patient today with physical therapist. Recommending CIR. TOC for discharge planning. Pending insurance auth -- f/u neurosurgery pod 14 for staple/suture removal (on or around 4/5)   chronic thrombocytopenia (HCC) --Goal platelet > 75 prior to neurosurgery in the AM --Decadron 40 mg IV, daily, 4 days ordered on admission--completed --plt 52K--1 unit --78K-- 64K--69K -- Dr. Cathie Hoops recommends continue to hold eliquis and follow-up with her as outpatient.   Type 2 diabetes mellitus without complication (HCC) --Insulin SSI with at bedtime coverage ordered --Goal inpatient blood glucose levels 140-180 --Not taking any po meds at home   Antiphospholipid antibody syndrome -- previously failed Coumadin -- recently started seeing hematology and repeat APS labs consistent with the diagnosis. Patient was started back on eliquis as outpatient recently---currently on hold   Constipation Per RN had BM 3/30   Hyperlipidemia --crestor  History of DVT of lower extremity and IVC filter placement --Home Eliquis will be held  in setting of bilateral subdural hematoma   Depression/h/o Dementia --Sertraline and Aricept (per dter --Alison Lopez)   COPD (chronic obstructive pulmonary disease) (HCC) --stable --prn nebs    Essential hypertension --bp soft --pt is not on any po meds   patient will discharge show acute  inpatient rehab at [facility]. Daughter updated.   Procedures: status post bilateral subdural hematoma evacuation Family communication : daughter Alison Lopez updated telephonically 3/31  consults :  neurosurgery CODE STATUS: full DVT Prophylaxis : lovenox      Diet recommendation:  Discharge Diet Orders (From admission, onward)     Start     Ordered   02/08/24 0000  Diet - low sodium heart healthy        02/08/24 1018            DISCHARGE MEDICATION: Allergies as of 02/08/2024       Reactions   Morphine And Codeine Anaphylaxis   Montelukast    Other reaction(s): Other (See Comments) Nightmares   Antihistamines, Diphenhydramine-type Nausea Only   Aspirin Other (See Comments)   Patient on blood thinners and was told not to take   Benadryl [diphenhydramine Hcl] Hives   Codeine Other (See Comments)   AMS        Medication List     STOP taking these medications    apixaban 5 MG Tabs tablet Commonly known as: ELIQUIS   buPROPion 150 MG 12 hr tablet Commonly known as: WELLBUTRIN SR   cyclobenzaprine 5 MG tablet Commonly known as: FLEXERIL   losartan 50 MG tablet Commonly known as: COZAAR   memantine 5 MG tablet Commonly known as: NAMENDA   metFORMIN 500 MG tablet Commonly known as: GLUCOPHAGE       TAKE these medications    ascorbic acid 100 MG tablet Commonly known as: VITAMIN C Take 1,000 mg by mouth daily.   cyanocobalamin 1000 MCG tablet Commonly known as: VITAMIN B12 Take 1 tablet (1,000 mcg total) by mouth daily.   donepezil 10 MG tablet Commonly known as: ARICEPT Take 10 mg by mouth at bedtime.   ergocalciferol 1.25 MG (50000 UT) capsule Commonly known as: VITAMIN D2 Take 50,000 Units by mouth every Friday.   ferrous sulfate 325 (65 FE) MG tablet Take by mouth.   levothyroxine 125 MCG tablet Commonly known as: SYNTHROID Take 125 mcg by mouth daily before breakfast.   polyethylene glycol 17 g packet Commonly known as: MIRALAX / GLYCOLAX Take 17 g by mouth daily. Start taking on: February 09, 2024   rosuvastatin 20 MG tablet Commonly known as: CRESTOR Take 20 mg by mouth daily.   senna 8.6 MG Tabs tablet Commonly known  as: SENOKOT Take 1 tablet (8.6 mg total) by mouth daily. Start taking on: February 09, 2024   sertraline 25 MG tablet Commonly known as: ZOLOFT Take 25 mg by mouth daily.        Follow-up Information     Susanne Borders, PA Follow up on 02/11/2024.   Specialty: Neurosurgery Contact information: [address] [place] [address] [phone]         Rayetta Humphrey, MD Follow up.   Specialty: Family Medicine Why: Hospital follow up Contact information: [address] [place] [address] [phone]         Rickard Patience, MD. Schedule an appointment as soon as possible for a visit in 2 week(s).   Specialty: Oncology Why: f/u antiphospholipid syndrome Contact information: [address] [place] [address] [phone]                Discharge Exam: Ceasar Mons Weights   01/29/24 1150 01/29/24 1712  Weight: 51.7 kg 49.9 kg   GENERAL:  68 y.o.-year-old patient with no acute distress.  LUNGS: Normal breath  sounds bilaterally CARDIOVASCULAR: S1, S2 normal. ABDOMEN: Soft, nontender, nondistended.  EXTREMITIES: No  edema b/l.    SKIN:  bilateral surgical incisions on scalp--staples+ NEUROLOGIC dysarthria, right upper and lower extremity weakness--improving  Condition at discharge: fair  The results of significant diagnostics from this hospitalization (including imaging, microbiology, ancillary and laboratory) are listed below for reference.   Imaging Studies: CT HEAD WO CONTRAST ( ) Result Date: 01/31/2024 CLINICAL DATA:  68 year old female with altered mental status and bilateral subdural hematomas at presentation. Postoperative now bilateral subdural evacuation. EXAM: CT HEAD WITHOUT CONTRAST TECHNIQUE: Contiguous axial images were obtained from the base of the skull through the vertex without intravenous contrast. RADIATION DOSE REDUCTION: This exam was performed according to the departmental dose-optimization program which  includes automated exposure control, adjustment of the mA and/or kV according to patient size and/or use of iterative reconstruction technique. COMPARISON:  Head CT 01/29/2024. FINDINGS: Brain: Postoperative changes to both subdural spaces with bilateral subdural drains in place. Associated pneumocephalus. Decreased volume of right side subdural hematoma, residual now only 4-5 mm at most levels (previously up to 14 mm). Decreased volume of left side subdural hematoma, more mixed density now and residual up to maximum thickness of 11 mm (previously 17 mm). Subsequent decreased intracranial mass effect and improved ventricle size and configuration. Mild rightward midline shift now 1-2 mm (coronal image 35). Small volume left para falcine subdural blood and pneumocephalus also. Basilar cisterns are patent and mildly improved. No IVH or new areas of intracranial hemorrhage. Chronic parietal 0 and occipital lobe encephalomalacia more pronounced on the right. Small chronic left cerebellar infarcts. No cortically based acute infarct identified. Vascular: Calcified atherosclerosis at the skull base. No suspicious intracranial vascular hyperdensity. Skull: New bilateral vertex burr holes. Sinuses/Orbits: Visualized paranasal sinuses and mastoids are stable and well aerated. Other: Postoperative changes now to the bilateral scalp vertex with percutaneous drains and skin staples in place. Similar leftward gaze. Postoperative changes to the right globe. IMPRESSION: 1. Postoperative substantial decrease in bilateral subdural hematomas with bilateral subdural drains in place. Residual blood up to 11 mm on the Left, 4-5 mm on the Right. 2. Significantly regressed intracranial mass effect. Residual rightward midline shift now 1-2 mm. 3. No new intracranial abnormality. Chronic ischemic disease. Electronically Signed   By: Odessa Fleming M.D.   On: 01/31/2024 05:16   CT Cervical Spine Wo Contrast Result Date: 01/29/2024 CLINICAL DATA:   Right upper and lower extremity weakness. Abrupt change. Neck trauma. EXAM: CT CERVICAL SPINE WITHOUT CONTRAST TECHNIQUE: Multidetector CT imaging of the cervical spine was performed without intravenous contrast. Multiplanar CT image reconstructions were also generated. RADIATION DOSE REDUCTION: This exam was performed according to the departmental dose-optimization program which includes automated exposure control, adjustment of the mA and/or kV according to patient size and/or use of iterative reconstruction technique. COMPARISON:  None Available. FINDINGS: Alignment: Slight degenerative anterolisthesis is present at C4-5. No other significant listhesis is present. Straightening of the normal cervical lordosis is present. Rightward curvature of the cervical spine is centered at C6. Skull base and vertebrae: The craniocervical junction is normal. Vertebral body heights are normal. No acute fractures are present. Soft tissues and spinal canal: The soft tissues of the neck are otherwise unremarkable. Salivary glands are within normal limits. Thyroid is normal. No significant adenopathy is present. No focal mucosal or submucosal lesions are present. Disc levels: Chronic endplate degenerative changes are present at C5-6. Uncovertebral spurring leads to moderate left and mild right foraminal stenosis. 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 Upper chest: The lung apices are clear. The thoracic inlet is within normal limits. IMPRESSION: 1. No acute or focal lesion to explain the patient's symptoms. 2. Chronic endplate degenerative changes at C5-6 with moderate left and mild right foraminal stenosis. 3. 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 Electronically Signed   By: Marin Roberts M.D.   On: 01/29/2024 15:08   CT HEAD WO CONTRAST Addendum Date: 01/29/2024 ADDENDUM REPORT: 01/29/2024 13:05 ADDENDUM: Study discussed by telephone with Dr. Artis Delay on 01/29/2024 at 1259 hours. Electronically Signed   By: Odessa Fleming M.D.    On: 01/29/2024 13:05   Result Date: 01/29/2024 CLINICAL DATA:  68 year old female with abrupt onset altered mental status, incontinence, not moving right arm. History of dementia. EXAM: CT HEAD WITHOUT CONTRAST TECHNIQUE: Contiguous axial images were obtained from the base of the skull through the vertex without intravenous contrast. RADIATION DOSE REDUCTION: This exam was performed according to the departmental dose-optimization program which includes automated exposure control, adjustment of the mA and/or kV according to patient size and/or use of iterative reconstruction technique. COMPARISON:  Head CT 02/13/2005. FINDINGS: Brain: New since 2006 bihemispheric hypodense subdural hematomas with intracranial mass effect. Both collections are fairly uniformly hypodense throughout much of the hemisphere but there is layering hyperdense blood posteriorly on both sides (series 2, image 17). Both collections are somewhat biconvex. That on the left measures up to 17 mm in maximum thickness (coronal image 42), on the right 14 mm in maximum thickness. Questionable trace para falcine hyperdense blood products also on the left coronal image 17. But no other convincing multi spatial hemorrhage. Subsequently there is no significant midline shift. Mass effect on the lateral ventricles but the basilar cisterns remain patent. Incidental choroid plexus cysts (normal variant). Chronic appearing encephalomalacia in both occipital poles (series 2, image 15), new since 2006. No cortically based acute infarct identified. And multiple small chronic appearing cerebellar infarcts more numerous on the left. Other patchy bilateral cerebral white matter hypodensity. Vascular: Calcified atherosclerosis at the skull base. No suspicious intracranial vascular hyperdensity. Skull: Intact.  No acute osseous abnormality identified. Sinuses/Orbits: Visualized paranasal sinuses and mastoids are stable and well aerated. Other: Visualized orbits and  scalp soft tissues are within normal limits. IMPRESSION: 1. Positive for bilateral mixed density Subdural Hematomas, somewhat biconvex and with 17 mm maximum thickness on the Left and 14 mm maximum thickness on the Right. Subsequent mass effect on both hemispheres although no significant midline shift. Basilar cisterns remain patent. 2. No skull fracture or other acute intracranial abnormality identified. 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 Electronically Signed: By: Odessa Fleming M.D. On: 01/29/2024 12:57    Microbiology: Results for orders placed or performed during the hospital encounter of 01/29/24  MRSA Next Gen by PCR, Nasal     Status: None   Collection Time: 01/29/24  5:17 PM   Specimen: Nasal Mucosa; Nasal Swab  Result Value Ref Range Status   MRSA by PCR Next Gen NOT DETECTED NOT DETECTED Final    Comment: (NOTE) The GeneXpert MRSA Assay (FDA approved for NASAL specimens only), is one component of a comprehensive MRSA colonization surveillance program. It is not intended to diagnose MRSA infection nor to guide or monitor treatment for MRSA infections. Test performance is not FDA approved in patients less than 69 years old. Performed at [facility], [address] [place], [place] [address]     Labs: CBC: Recent Labs  Lab 02/07/24  0443  WBC 6.7  HGB 11.2*  HCT 32.4*  MCV 89.0  PLT 69*   Basic Metabolic Panel: Recent Labs  Lab 02/07/24 0443  NA 137  K 4.7  CL 104  CO2 28  GLUCOSE 98  BUN 21  CREATININE 1.01*  CALCIUM 8.7*   CBG: Recent Labs  Lab 02/07/24 0930 02/07/24 1222 02/07/24 1636 02/07/24 2121 02/08/24 0850  GLUCAP 101* 179* 82 103* 89    Discharge time spent: greater than 30 minutes.  Signed: Enedina Finner, MD [facility] 02/08/2024

## 2024-02-08 NOTE — Discharge Instructions (Addendum)
 Scalp staples to be removed April 5th, 2025  Your ELIQUIS IS discontinued for now till you are seen by Dr Cathie Hoops as out pt

## 2024-02-08 NOTE — Progress Notes (Signed)
 Horton Chin, MD Physician Physical Medicine and Rehabilitation   Consult Note    Signed   Date of Service: 02/05/2024 12:06 PM  Related encounter: ED to Hosp-Admission (Discharged) from 01/29/2024 in [facility] 1C MEDICAL TELEMETRY   Signed     Expand All Collapse All           Physical Medicine and Rehabilitation Consult Reason for Consult: Bilateral subdural hematomas Referring Physician: Enedina Finner, MD     HPI: Alison Lopez is a 68 y.o. female who presented here with AMS and was found to have bilateral subdural hematomas now s/p Burr holes. PMH inludes dementia, history of DVT on Eliquis, chironic thrombocytopenia, B12 deficiency, HTN. Physical Medicine & Rehabilitation was consulted to assess candidacy for CIR.       ROS +impaired ambulation and ADLs     Past Medical History:  Diagnosis Date   Anxiety     Arthritis     Collagen vascular disease (HCC)     COPD (chronic obstructive pulmonary disease) (HCC)     DDD (degenerative disc disease), lumbar     Dementia (HCC)     Depression     Diabetes mellitus without complication (HCC)     DVT (deep venous thrombosis) (HCC)     Fibromyalgia     GERD (gastroesophageal reflux disease)     Graves' disease with exophthalmos     HLD (hyperlipidemia)     Hypertension     Hypothyroidism     Lupus     Osteoporosis               Past Surgical History:  Procedure Laterality Date   ABDOMINAL HYSTERECTOMY       BURR HOLE Bilateral 01/30/2024    Procedure: CREATION, CRANIAL BURR HOLE, SUBDURAL HEMATOMA EVACUATIOIN;  Surgeon: Venetia Night, MD;  Location: [facility] ORS;  Service: Neurosurgery;  Laterality: Bilateral;  need ventriculostomy x 2   COLONOSCOPY WITH PROPOFOL N/A 06/11/2021    Procedure: COLONOSCOPY WITH PROPOFOL;  Surgeon: Regis Bill, MD;  Location: [facility] ENDOSCOPY;  Service: Endoscopy;  Laterality: N/A;  prefers afternoon Eliquis   COLONOSCOPY WITH PROPOFOL N/A 12/19/2022     Procedure: COLONOSCOPY WITH PROPOFOL;  Surgeon: Regis Bill, MD;  Location: [facility] ENDOSCOPY;  Service: Endoscopy;  Laterality: N/A;   ESOPHAGOGASTRODUODENOSCOPY N/A 06/11/2021    Procedure: ESOPHAGOGASTRODUODENOSCOPY (EGD);  Surgeon: Regis Bill, MD;  Location: [facility] ENDOSCOPY;  Service: Endoscopy;  Laterality: N/A;   EYE SURGERY Right     IVC FILTER REMOVAL N/A 07/21/2019    Procedure: IVC FILTER REMOVAL;  Surgeon: Annice Needy, MD;  Location: [facility];  Service: Cardiovascular;  Laterality: N/A;   LOWER EXTREMITY INTERVENTION Right 04/22/2019    Procedure: IVC Filter Insertion with Right Lower Extremity Venous Lysis;  Surgeon: Annice Needy, MD;  Location: [facility];  Service: Cardiovascular;  Laterality: Right;   OOPHORECTOMY                 Family History  Problem Relation Age of Onset   Stomach cancer Mother     Heart attack Father     Breast cancer Neg Hx          Social History:  reports that she quit smoking about 23 years ago. Her smoking use included cigarettes. She started smoking about 40 years ago. She has a 17 pack-year smoking history. She has never used smokeless tobacco. She reports that she does  not currently use alcohol. She reports that she does not use drugs. Allergies:  Allergies       Allergies  Allergen Reactions   Morphine And Codeine Anaphylaxis   Montelukast        Other reaction(s): Other (See Comments) Nightmares   Antihistamines, Diphenhydramine-Type Nausea Only   Aspirin Other (See Comments)      Patient on blood thinners and was told not to take   Benadryl [Diphenhydramine Hcl] Hives   Codeine Other (See Comments)      AMS            Medications Prior to Admission  Medication Sig Dispense Refill   apixaban (ELIQUIS) 5 MG TABS tablet Take 1 tablet (5 mg total) by mouth 2 (two) times daily. 60 tablet 2   ascorbic acid (VITAMIN C) 100 MG tablet Take 1,000 mg by mouth daily.       buPROPion (WELLBUTRIN SR)  150 MG 12 hr tablet Take 150 mg by mouth daily.       cyanocobalamin (VITAMIN B12) 1000 MCG tablet Take 1 tablet (1,000 mcg total) by mouth daily. 90 tablet 1   ergocalciferol (VITAMIN D2) 1.25 MG (50000 UT) capsule Take 50,000 Units by mouth every Friday.       levothyroxine (SYNTHROID) 125 MCG tablet Take 125 mcg by mouth daily before breakfast.       rosuvastatin (CRESTOR) 20 MG tablet Take 20 mg by mouth daily.       acetaminophen (TYLENOL) 650 MG CR tablet Take 650 mg by mouth every 8 (eight) hours as needed for pain. (Patient not taking: Reported on 01/26/2024)       cyclobenzaprine (FLEXERIL) 5 MG tablet Take 5 mg by mouth 3 (three) times daily as needed for muscle spasms. (Patient not taking: Reported on 01/21/2024)       donepezil (ARICEPT) 10 MG tablet Take 10 mg by mouth at bedtime. (Patient not taking: Reported on 01/31/2024)       ferrous sulfate 325 (65 FE) MG tablet Take by mouth.       losartan (COZAAR) 50 MG tablet Take 50 mg by mouth daily. prn (Patient not taking: Reported on 01/31/2024)       memantine (NAMENDA) 5 MG tablet Take 5 mg by mouth daily. (Patient not taking: Reported on 01/31/2024)       metFORMIN (GLUCOPHAGE) 500 MG tablet Take 500 mg by mouth daily.  (Patient not taking: Reported on 01/31/2024)       sertraline (ZOLOFT) 25 MG tablet Take 25 mg by mouth daily. (Patient not taking: Reported on 01/31/2024)              Home: Home Living Family/patient expects to be discharged to:: Skilled nursing facility Living Arrangements: Children Available Help at Discharge: Family, Available PRN/intermittently Type of Home: House Home Access: Stairs to enter Secretary/administrator of Steps: 4 Entrance Stairs-Rails: Right Home Equipment: Rollator (4 wheels) Additional Comments: information from chart, will need to confirm  Lives With: Daughter  Functional History: Prior Function Prior Level of Function : Needs assist  Cognitive Assist :  (baseline dementia) Mobility  Comments: per chart, pt amb household distances ADLs Comments: per chart, pt requires assist for bathing, all IADLs; Functional Status:  Mobility: Bed Mobility Overal bed mobility: Needs Assistance Bed Mobility: Supine to Sit Rolling: Min assist Supine to sit: Min assist Sit to supine: Min assist General bed mobility comments: Pt requires assistance for her right side and cues Transfers Overall transfer level: Needs  assistance Equipment used: 1 person hand held assist Transfers: Sit to/from Stand Sit to Stand: Min assist General transfer comment: L hand held assist and steadying for balance Ambulation/Gait Ambulation/Gait assistance: Min assist Gait Distance (Feet): 200 Feet Assistive device: 1 person hand held assist Gait Pattern/deviations: Step-through pattern, Decreased step length - right, Decreased step length - left, Narrow base of support, Decreased stride length General Gait Details: patient doing well with ambulation. Requires cues for direction due to poor vision. Follows 1 step direction with increased time and inconsistently. Able to increase distance significantly this session. Patient requires heavy cues for safety, ADLs, ambulation. Poor vision and cognition. Gait velocity: decreased Pre-gait activities: with physical assistance, patient is able to take 3 side steps to the right with physical assistance for advancement of RLE. weight shifting faciliation to the left  movement of RLE with active hip movement noted. patient relying on the bed for posterior leg support   ADL: ADL Overall ADL's : Needs assistance/impaired Eating/Feeding: Set up Grooming: Wash/dry face, Brushing hair, Standing, Oral care Upper Body Dressing : Maximal assistance Upper Body Dressing Details (indicate cue type and reason): anticipate Lower Body Dressing: Maximal assistance Lower Body Dressing Details (indicate cue type and reason): donn ted hose/socks, raised LLE to assist Toilet Transfer:  Regular Toilet, Ambulation, Cueing for safety, Cueing for sequencing, +2 for safety/equipment (HHA) Toileting- Clothing Manipulation and Hygiene: Maximal assistance, Sit to/from stand Functional mobility during ADLs: +2 for safety/equipment (HHA) General ADL Comments: Pt completed standing grooming tasks at sink level then sat to complete oral care for additional stability. Pt benefited from moderate verbal and visual cues for tasks sequence.   Cognition: Cognition Overall Cognitive Status: Impaired/Different from baseline Arousal/Alertness: Awake/alert Orientation Level: Oriented to person, Disoriented to place, Disoriented to time, Disoriented to situation Year:  (required binary options) Month:  (required cues) Day of Week: Correct Attention: Sustained Sustained Attention: Impaired Sustained Attention Impairment: Verbal basic Memory: Impaired Memory Impairment: Decreased recall of new information, Storage deficit Awareness: Impaired Awareness Impairment: Intellectual impairment Problem Solving: Impaired Problem Solving Impairment: Verbal basic Executive Function: Organizing Organizing: Impaired Organizing Impairment: Verbal basic Behaviors:  (none) Safety/Judgment:  (emerging- reports concern for fall if she attempted to stand) Cognition Arousal: Alert Behavior During Therapy: WFL for tasks assessed/performed Overall Cognitive Status: Impaired/Different from baseline   Blood pressure 138/63, pulse (!) 54, temperature 98.1 F (36.7 C), temperature source Oral, resp. rate 16, height 4\' 9"  (1.448 m), weight 49.9 kg, SpO2 100%. Physical Exam Gen: no distress, normal appearing HEENT: oral mucosa pink and moist, NCAT Cardio: Bradycardic Chest: normal effort, normal rate of breathing Abd: soft, non-distended Ext: no edema Psych: pleasant, normal affect Skin: staples c/d/I on scalp Neuro: Alert and oriented x3, impaired awareness and sustained attention, right sided strength  3/5, left sided strength intact   Lab Results Last 24 Hours       Results for orders placed or performed during the hospital encounter of 01/29/24 (from the past 24 hours)  Glucose, capillary     Status: Abnormal    Collection Time: 02/04/24  3:40 PM  Result Value Ref Range    Glucose-Capillary 104 (H) 70 - 99 mg/dL  Glucose, capillary     Status: None    Collection Time: 02/04/24  8:39 PM  Result Value Ref Range    Glucose-Capillary 95 70 - 99 mg/dL  Glucose, capillary     Status: None    Collection Time: 02/05/24  8:19 AM  Result Value Ref Range  Glucose-Capillary 97 70 - 99 mg/dL  Glucose, capillary     Status: Abnormal    Collection Time: 02/05/24 11:34 AM  Result Value Ref Range    Glucose-Capillary 148 (H) 70 - 99 mg/dL      Imaging Results (Last 48 hours)  No results found.     Assessment/Plan: Diagnosis: Bilateral subdural hematomas Does the need for close, 24 hr/day medical supervision in concert with the patient's rehab needs make it unreasonable for this patient to be served in a less intensive setting? Yes Co-Morbidities requiring supervision/potential complications:  1) Bradycardia: continue to monitor HR TID 2) Hypertension: continue to monitor BP TID 3) Constipation: last BM 3/27, consider discontinuing senna daily 4) Depression: continue Zoloft 5) HLD: continue crestor Due to bladder management, bowel management, safety, skin/wound care, disease management, medication administration, pain management, and patient education, does the patient require 24 hr/day rehab nursing? Yes Does the patient require coordinated care of a physician, rehab nurse, therapy disciplines of PT, OT, SLP to address physical and functional deficits in the context of the above medical diagnosis(es)? Yes Addressing deficits in the following areas: balance, endurance, locomotion, strength, transferring, bowel/bladder control, bathing, dressing, feeding, grooming, toileting, cognition, and  psychosocial support Can the patient actively participate in an intensive therapy program of at least 3 hrs of therapy per day at least 5 days per week? Yes The potential for patient to make measurable gains while on inpatient rehab is excellent Anticipated functional outcomes upon discharge from inpatient rehab are supervision  with PT, supervision with OT, supervision with SLP. Estimated rehab length of stay to reach the above functional goals is: 5-7 days Anticipated discharge destination: Home Overall Rehab/Functional Prognosis: excellent   POST ACUTE RECOMMENDATIONS: This patient's condition is appropriate for continued rehabilitative care in the following setting: CIR Patient has agreed to participate in recommended program. Yes Note that insurance prior authorization may be required for reimbursement for recommended care.     I have personally performed a face to face diagnostic evaluation of this patient. Additionally, I have examined the patient's medical record including any pertinent labs and radiographic images.     Thanks,   Horton Chin, MD 02/05/2024          Routing History

## 2024-02-09 DIAGNOSIS — S065XAA Traumatic subdural hemorrhage with loss of consciousness status unknown, initial encounter: Secondary | ICD-10-CM | POA: Diagnosis not present

## 2024-02-09 LAB — GLUCOSE, CAPILLARY
Glucose-Capillary: 92 mg/dL (ref 70–99)
Glucose-Capillary: 134 mg/dL — ABNORMAL HIGH (ref 70–99)
Glucose-Capillary: 139 mg/dL — ABNORMAL HIGH (ref 70–99)
Glucose-Capillary: 133 mg/dL — ABNORMAL HIGH (ref 70–99)

## 2024-02-09 LAB — CBC WITH DIFFERENTIAL/PLATELET
Abs Immature Granulocytes: 0.06 10*3/uL (ref 0.00–0.07)
Basophils Absolute: 0 10*3/uL (ref 0.0–0.1)
Basophils Relative: 0 %
Eosinophils Absolute: 0.2 10*3/uL (ref 0.0–0.5)
Eosinophils Relative: 2 %
HCT: 32.5 % — ABNORMAL LOW (ref 36.0–46.0)
Hemoglobin: 11 g/dL — ABNORMAL LOW (ref 12.0–15.0)
Immature Granulocytes: 1 %
Lymphocytes Relative: 19 %
Lymphs Abs: 1.5 10*3/uL (ref 0.7–4.0)
MCH: 30.6 pg (ref 26.0–34.0)
MCHC: 33.8 g/dL (ref 30.0–36.0)
MCV: 90.5 fL (ref 80.0–100.0)
Monocytes Absolute: 0.8 10*3/uL (ref 0.1–1.0)
Monocytes Relative: 10 %
Neutro Abs: 5.7 10*3/uL (ref 1.7–7.7)
Neutrophils Relative %: 68 %
Platelets: 76 10*3/uL — ABNORMAL LOW (ref 150–400)
RBC: 3.59 MIL/uL — ABNORMAL LOW (ref 3.87–5.11)
RDW: 13 % (ref 11.5–15.5)
WBC: 8.3 10*3/uL (ref 4.0–10.5)
nRBC: 0 % (ref 0.0–0.2)

## 2024-02-09 LAB — COMPREHENSIVE METABOLIC PANEL WITH GFR
ALT: 15 U/L (ref 0–44)
AST: 18 U/L (ref 15–41)
Albumin: 2.9 g/dL — ABNORMAL LOW (ref 3.5–5.0)
Alkaline Phosphatase: 49 U/L (ref 38–126)
Anion gap: 7 (ref 5–15)
BUN: 16 mg/dL (ref 8–23)
CO2: 25 mmol/L (ref 22–32)
Calcium: 8.8 mg/dL — ABNORMAL LOW (ref 8.9–10.3)
Chloride: 106 mmol/L (ref 98–111)
Creatinine, Ser: 1.04 mg/dL — ABNORMAL HIGH (ref 0.44–1.00)
GFR, Estimated: 58 mL/min — ABNORMAL LOW (ref >60)
Glucose, Bld: 97 mg/dL (ref 70–99)
Potassium: 4.3 mmol/L (ref 3.5–5.1)
Sodium: 138 mmol/L (ref 135–145)
Total Bilirubin: 0.5 mg/dL (ref 0.0–1.2)
Total Protein: 5.9 g/dL — ABNORMAL LOW (ref 6.5–8.1)

## 2024-02-09 MED ORDER — PANTOPRAZOLE SODIUM 40 MG PO TBEC
40.0000 mg | DELAYED_RELEASE_TABLET | Freq: Two times a day (BID) | ORAL | Status: DC
  Administered 2024-02-09 – 2024-02-15 (×13): 40 mg via ORAL
  Filled 2024-02-09 (×13): qty 1

## 2024-02-09 NOTE — Evaluation (Signed)
 Physical Therapy Assessment and Plan  Patient Details  Name: Alison Lopez MRN: [id number] Date of Birth: 02-09-1956  PT Diagnosis: Abnormality of gait, Hemiplegia dominant, Impaired cognition, and Muscle weakness Rehab Potential: Good ELOS: 12-14 days   Today's Date: 02/09/2024 PT Individual Time: 4098-1191 PT Individual Time Calculation (min): 70 min    Hospital Problem: Principal Problem:   SDH (subdural hematoma) (HCC)   Past Medical History:  Past Medical History:  Diagnosis Date   Anxiety    Arthritis    Collagen vascular disease (HCC)    COPD (chronic obstructive pulmonary disease) (HCC)    DDD (degenerative disc disease), lumbar    Dementia (HCC)    Depression    Diabetes mellitus without complication (HCC)    DVT (deep venous thrombosis) (HCC)    Fibromyalgia    GERD (gastroesophageal reflux disease)    Graves' disease with exophthalmos    HLD (hyperlipidemia)    Hypertension    Hypothyroidism    Lupus    Osteoporosis    Past Surgical History:  Past Surgical History:  Procedure Laterality Date   ABDOMINAL HYSTERECTOMY     BURR HOLE Bilateral 01/30/2024   Procedure: CREATION, CRANIAL BURR HOLE, SUBDURAL HEMATOMA EVACUATIOIN;  Surgeon: Venetia Night, MD;  Location: [facility] ORS;  Service: Neurosurgery;  Laterality: Bilateral;  need ventriculostomy x 2   COLONOSCOPY WITH PROPOFOL N/A 06/11/2021   Procedure: COLONOSCOPY WITH PROPOFOL;  Surgeon: Regis Bill, MD;  Location: [facility] ENDOSCOPY;  Service: Endoscopy;  Laterality: N/A;  prefers afternoon Eliquis   COLONOSCOPY WITH PROPOFOL N/A 12/19/2022   Procedure: COLONOSCOPY WITH PROPOFOL;  Surgeon: Regis Bill, MD;  Location: [facility] ENDOSCOPY;  Service: Endoscopy;  Laterality: N/A;   ESOPHAGOGASTRODUODENOSCOPY N/A 06/11/2021   Procedure: ESOPHAGOGASTRODUODENOSCOPY (EGD);  Surgeon: Regis Bill, MD;  Location: [facility] ENDOSCOPY;  Service: Endoscopy;  Laterality: N/A;   EYE SURGERY Right    IVC FILTER  REMOVAL N/A 07/21/2019   Procedure: IVC FILTER REMOVAL;  Surgeon: Annice Needy, MD;  Location: [facility] INVASIVE CV LAB;  Service: Cardiovascular;  Laterality: N/A;   LOWER EXTREMITY INTERVENTION Right 04/22/2019   Procedure: IVC Filter Insertion with Right Lower Extremity Venous Lysis;  Surgeon: Annice Needy, MD;  Location: [facility] INVASIVE CV LAB;  Service: Cardiovascular;  Laterality: Right;   OOPHORECTOMY      Assessment & Plan Clinical Impression: Alison Lopez is a 68 year old right-handed female with history of DVT maintained on Eliquis, mention maintained on Aricept chronic thrombocytopenia followed by Dr Smith Robert, B12 deficiency, hypertension, diabetes mellitus with retinopathy and legally blind, quit smoking 23 years ago. Per chart review patient lives with her children. 4 steps to entry of home. Ambulates household distances without assistive device. Presented 01/29/[facility]24 to [facility] with altered mental status, right-sided weakness and urinary/fecal incontinence. Family denied any recent fall or trauma. Cranial CT scan positive for bilateral mixed density subdural hematoma somewhat biconvex with a 17 mm maximum thickness on the left and 14 mm maximum thickness on the right. Subsequent mass effect of both hemispheres although no significant midline shift. Basilar cisterns remain patent. No skull fracture or other acute intracranial abnormality. Admission chemistries unremarkable except glucose 135, alcohol negative, platelets 52,000. Underwent bilateral frontal burr hole for drainage of subdural hematoma 01/30/2024 per Dr. Marcell Barlow. Latest follow-up cranial CT scan 01/31/2024 showed postoperative substantial decrease in bilateral subdural hematomas. Residual blood up to 11 mm on the left, 4-5 mm on the right. Significantly regressed intracranial mass effect. Residual rightward  midline shift now 1-2 mm. . Therapy evaluations completed due to patient decreased functional mobility and  right-sided weakness was admitted for a comprehensive rehab program.    Patient currently requires min with mobility secondary to muscle weakness and impaired timing and sequencing, decreased coordination, and decreased motor planning.  Prior to hospitalization, patient was independent  with mobility and lived with Daughter in a Mobile home home.  Home access is 6 (per pt.)Stairs to enter.  Patient will benefit from skilled PT intervention to maximize safe functional mobility, minimize fall risk, and decrease caregiver burden for planned discharge home with 24 hour supervision.  Anticipate patient will benefit from follow up HH at discharge.  PT - End of Session Activity Tolerance: Tolerates 30+ min activity with multiple rests Endurance Deficit: Yes Endurance Deficit Description: generalized deconditioning PT Assessment Rehab Potential (ACUTE/IP ONLY): Good PT Barriers to Discharge: Home environment access/layout;Other (comments) PT Barriers to Discharge Comments: blindness. PT Patient demonstrates impairments in the following area(s): Balance;Safety;Endurance;Motor PT Transfers Functional Problem(s): Bed Mobility;Bed to Chair;Car PT Locomotion Functional Problem(s): Ambulation;Stairs PT Plan PT Intensity: Minimum of 1-2 x/day ,45 to 90 minutes PT Frequency: 5 out of 7 days PT Duration Estimated Length of Stay: 12-14 days PT Treatment/Interventions: Ambulation/gait training;Community reintegration;Neuromuscular re-education;Stair training;UE/LE Strength taining/ROM;Balance/vestibular training;Discharge planning;Therapeutic Activities;UE/LE Coordination activities;Therapeutic Exercise;Patient/family education;Functional mobility training PT Transfers Anticipated Outcome(s): supervision 2/2 visual deficits. PT Locomotion Anticipated Outcome(s): CGA w/ LRAD PT Recommendation Follow Up Recommendations: Home health PT Patient destination: Home Equipment Recommended: To be determined   PT  Evaluation Precautions/Restrictions Precautions Precautions: Fall Precaution/Restrictions Comments: blind. Restrictions Weight Bearing Restrictions Per Provider Order: No General Chart Reviewed: Yes PT Amount of Missed Time (min): 5 Minutes PT Missed Treatment Reason: Other (Comment) (finishing lunch) Family/Caregiver Present: Yes Vital SignsTherapy Vitals Pulse Rate: 63 Resp: 19 BP: (!) 87/57 Patient Position (if appropriate): Lying Oxygen Therapy SpO2: 100 % O2 Device: Room Air Pain Pain Assessment Pain Scale: 0-10 Pain Score: 0-No pain Pain Interference Pain Interference Pain Effect on Sleep: 0. Does not apply - I have not had any pain or hurting in the past 5 days Pain Interference with Therapy Activities: 0. Does not apply - I have not received rehabilitationtherapy in the past 5 days Pain Interference with Day-to-Day Activities: 1. Rarely or not at all Home Living/Prior Functioning Home Living Available Help at Discharge: Family;Available PRN/intermittently Type of Home: Mobile home Home Access: Stairs to enter Entrance Stairs-Number of Steps: 6 (per pt.)  Lives With: Daughter Prior Function Level of Independence: Independent with transfers;Independent with gait  Able to Take Stairs?: Yes Vision/Perception     Cognition Overall Cognitive Status: Impaired/Different from baseline Awareness: Impaired Problem Solving: Impaired Safety/Judgment: Impaired Sensation Sensation Light Touch: Appears Intact Coordination Gross Motor Movements are Fluid and Coordinated: No Fine Motor Movements are Fluid and Coordinated: No Heel Shin Test: unable even w/ placement and instructions. Motor  Motor Motor: Hemiplegia Motor - Skilled Clinical Observations: mild R side.   Trunk/Postural Assessment  Cervical Assessment Cervical Assessment: Exceptions to [facility] (forward head) Thoracic Assessment Thoracic Assessment: Exceptions to [facility] (rounded shoulders.) Lumbar  Assessment Lumbar Assessment: Exceptions to [facility] (post pelvic tilt.) Postural Control Postural Control: Deficits on evaluation Righting Reactions: delayed  Balance Balance Balance Assessed: Yes Static Sitting Balance Static Sitting - Balance Support: No upper extremity supported;Feet supported Static Sitting - Level of Assistance: 6: Modified independent (Device/Increase time) Dynamic Sitting Balance Dynamic Sitting - Balance Support: No upper extremity supported;Feet supported Dynamic Sitting - Level of Assistance: 5:  Stand by assistance;4: Min assist Cytogeneticist Standing - Balance Support: No upper extremity supported Static Standing - Level of Assistance: 4: Min assist Dynamic Standing Balance Dynamic Standing - Balance Support: No upper extremity supported Dynamic Standing - Level of Assistance: 4: Min assist Extremity Assessment      RLE Assessment RLE Assessment: Not tested General Strength Comments: grossly at least 4/5, but not fully tested 2/2 conitive deficits. LLE Assessment LLE Assessment: Not tested General Strength Comments: grossly at least 4/5, but not fully tested 2/2 conitive deficits.  Care Tool Care Tool Bed Mobility Roll left and right activity   Roll left and right assist level: Supervision/Verbal cueing    Sit to lying activity   Sit to lying assist level: Supervision/Verbal cueing    Lying to sitting on side of bed activity   Lying to sitting on side of bed assist level: the ability to move from lying on the back to sitting on the side of the bed with no back support.: Supervision/Verbal cueing     Care Tool Transfers Sit to stand transfer   Sit to stand assist level: Minimal Assistance - Patient > 75%    Chair/bed transfer   Chair/bed transfer assist level: Minimal Assistance - Patient > 75%    Car transfer   Car transfer assist level: Minimal Assistance - Patient > 75%      Care Tool Locomotion Ambulation   Assist  level: Minimal Assistance - Patient > 75% Assistive device: Hand held assist (and no device.) Max distance: 200  Walk 10 feet activity   Assist level: Minimal Assistance - Patient > 75% Assistive device: Hand held assist (and no device.)   Walk 50 feet with 2 turns activity   Assist level: Minimal Assistance - Patient > 75% Assistive device: Hand held assist (and no device.)  Walk 150 feet activity   Assist level: Minimal Assistance - Patient > 75% Assistive device: Hand held assist (and no device.)  Walk 10 feet on uneven surfaces activity   Assist level: Minimal Assistance - Patient > 75% Assistive device: Hand held assist  Stairs   Assist level: Minimal Assistance - Patient > 75% Stairs assistive device: 2 hand rails Max number of stairs: 12  Walk up/down 1 step activity   Walk up/down 1 step (curb) assist level: Minimal Assistance - Patient > 75% Walk up/down 1 step or curb assistive device: 2 hand rails  Walk up/down 4 steps activity   Walk up/down 4 steps assist level: Minimal Assistance - Patient > 75% Walk up/down 4 steps assistive device: 2 hand rails  Walk up/down 12 steps activity   Walk up/down 12 steps assist level: Minimal Assistance - Patient > 75% Walk up/down 12 steps assistive device: 2 hand rails  Pick up small objects from floor   Pick up small object from the floor assist level: Minimal Assistance - Patient > 75% Pick up small object from the floor assistive device: no device.  Wheelchair Is the patient using a wheelchair?: Yes Type of Wheelchair: Manual   Wheelchair assist level: Moderate Assistance - Patient 50 - 74% Max wheelchair distance: 20  Wheel 50 feet with 2 turns activity   Assist Level: Total Assistance - Patient < 25%  Wheel 150 feet activity   Assist Level: Dependent - Patient 0%    Refer to Care Plan for Long Term Goals  SHORT TERM GOAL WEEK 1 PT Short Term Goal 1 (Week 1): Pt will transfer sit to stand  w/ CGA consistently. PT Short  Term Goal 2 (Week 1): Pt will amb 300' / LRAD and min A PT Short Term Goal 3 (Week 1): Pt will negotiate 6 steps w/ 1 rail and min A.  Recommendations for other services: None   Skilled Therapeutic Intervention Evaluation completed (see details above and below) with education on PT POC and goals and individual treatment initiated with focus on  balance, gait, transfers, endurance, strength.  Pt presents sitting in w/c  and finishing lunch w/ daughter feeding.  PT returns to complete eval.  Pt attempted to wheel w/c in hallways after hand over hand placement on wheels, performing x 20' before fatigues and manual A 2/2 visual deficits.Pt wheeled remainder of distance to main gym.  Pt negotiated 12 steps (4+4+4) w/ B rails and min A, step-to gait pattern.  Pt requires verbal and manual cues for RUE advancement on rail.  Pt wheeled to small gym.  Pt amb x 10' to car height simulated car transfer w/ min A and cues for complete turn to seat as well as proper hand placement.  Pt negotiated ramped surface w/ min A.  Pt amb x 200+ w/o AD or only HHA, w/ noted unsteadiness.  Pt requires tactile cueing for directions.  Pt donned shoes and amb 200+ w/o AD and improved balance.  Pt amb often w/ hands crossed or reaching forward and out.  Pt amb to bed and transferred sit to supine w/ supervision.  Pt doffs R shoe through use of L hand, but requires A for Left, even w/ Figure-4 position.  Pt given "pancake" call light and demos use.  Bed alarm on and all needs in reach, dtr present in room.    Mobility Bed Mobility Bed Mobility: Supine to Sit;Sit to Supine Supine to Sit: Supervision/Verbal cueing Sit to Supine: Supervision/Verbal cueing Transfers Transfers: Sit to Stand;Stand to Sit Sit to Stand: Minimal Assistance - Patient > 75% Stand to Sit: Minimal Assistance - Patient > 75% Stand Pivot Transfer Details: Verbal cues for technique;Verbal cues for precautions/safety Locomotion  Gait Ambulation: Yes Gait  Assistance: Minimal Assistance - Patient > 75% Gait Distance (Feet): 200 Feet Assistive device: None;1 person hand held assist Gait Assistance Details: Verbal cues for precautions/safety;Other (comment) (directions 2/2 blindness.) Gait Gait: Yes Gait Pattern: Step-through pattern;Narrow base of support Gait velocity: decreased Stairs / Additional Locomotion Stairs: Yes Stairs Assistance: Minimal Assistance - Patient > 75% Stair Management Technique: Two rails Number of Stairs: 12 Height of Stairs: 6 Ramp: Minimal Assistance - Patient >75% Curb: Minimal Assistance - Patient >75% Wheelchair Mobility Wheelchair Mobility: Yes Wheelchair Assistance: Moderate Assistance - Patient 50 - 74% Wheelchair Propulsion: Both upper extremities Wheelchair Parts Management: Needs assistance Distance: 20   Discharge Criteria: Patient will be discharged from PT if patient refuses treatment 3 consecutive times without medical reason, if treatment goals not met, if there is a change in medical status, if patient makes no progress towards goals or if patient is discharged from hospital.  The above assessment, treatment plan, treatment alternatives and goals were discussed and mutually agreed upon: by patient and by family  Lucio Edward 02/09/2024, 3:31 PM

## 2024-02-09 NOTE — Patient Care Conference (Signed)
 Inpatient RehabilitationTeam Conference and Plan of Care Update Date: 02/09/2024   Time: 1005 am    Patient Name: Alison Lopez      Medical Record Number: [id number]  Date of Birth: 08-12-1956 Sex: Female         Room/Bed: 4W17C/4W17C-01 Payor Info: Payor: [employer] MEDICARE / Plan: [employer] MEDICARE HMO / Product Type: *No Product type* /    Admit Date/Time:  02/08/2024 12:28 PM  Primary Diagnosis:  SDH (subdural hematoma) [facility])  Hospital Problems: Principal Problem:   SDH (subdural hematoma) (HCC)    Expected Discharge Date: Expected Discharge Date:  (evals pending)  Team Members Present: Physician leading conference: Dr. Elijah Birk Social Worker Present: Cecile Sheerer, LCSWA Nurse Present: Konrad Dolores, RN PT Present: Darrold Span, PT OT Present: Jake Shark, OT SLP Present: Other (comment) Alvera Novel SLP)     Current Status/Progress Goal Weekly Team Focus  Bowel/Bladder   pt incontinent of b/b  Black stools  Regain continence  Normal color of stool Assist q2-q4 time toileting  PVR's x 3 days Assess color of stool   Swallow/Nutrition/ Hydration   reg/thin           ADL's   Mod-max A ADLs d/t apraxia, min A ADL transfers, very low vision   Supervision goals   ADLs, cognitive retraining, transfers, motor planning    Mobility   eval pending           Communication   moderate aphasia (expressive > receptive) and motor speech deficits   minA   functional naming tasks    Safety/Cognition/ Behavioral Observations  baseline memory deficits per EMR, mild problem solving deficits evident, difficult to assess otherwise given aphasia   minA   pt/family edu, functional problem solving    Pain   No c/o pain   Remain pain free   Assess qshift and prn    Skin   Skin intact, bore holes on bilateral head, ota with staples  Suture removal 02/13/2024 Maintain skin integrity, promote healing  Assess qshift and prn      Discharge Planning:  TBA.  Per EMR, pt will d/c to home with support from family; pt dtr Hospital doctor working on arranging 24/7 care. SW will confirm there are no barriers to discharge.   Team Discussion: Patient was admitted post bilateral frontal burr hole for drainage of subdural hematoma with residual midline shift. Patient limited by visual deficits, confusion, apraxia, aphasia, dysarthria and cognitive deficits.   Patient on target to meet rehab goals: Evals pending  *See Care Plan and progress notes for long and short-term goals.   Revisions to Treatment Plan:  Soft touch call bell Supervision with feedings  Teaching Needs: Safety, toileting, transfers, medications, etc.   Current Barriers to Discharge: Decreased caregiver support and Incontinence  Possible Resolutions to Barriers: Family education     Medical Summary Current Status: medically complicated by thrombocytopenia, anemia, vision deficits, cognitive deficits/questionable dementia, bradycardia, and bowel and bladder incontinence  Barriers to Discharge: Behavior/Mood;Electrolyte abnormality;Inadequate Nutritional Intake;Incontinence;Renal Insufficiency/Failure;Self-care education;Medical stability   Possible Resolutions to [employer] Focus: Timed toileting and and titrate bowel medications, monitor labs for worsening thrombocytopenia, encourage p.o. intake/fluid and monitor AKI, encourage sleep/wake cycle and medication adjustments to promote daytime alertness   Continued Need for Acute Rehabilitation Level of Care: The patient requires daily medical management by a physician with specialized training in physical medicine and rehabilitation for the following reasons: Direction of a multidisciplinary physical rehabilitation program to maximize functional independence : Yes Medical  management of patient stability for increased activity during participation in an intensive rehabilitation regime.: Yes Analysis of laboratory values and/or radiology  reports with any subsequent need for medication adjustment and/or medical intervention. : Yes   I attest that I was present, lead the team conference, and concur with the assessment and plan of the team.   Konrad Dolores 02/09/2024, 1005 am

## 2024-02-09 NOTE — Telephone Encounter (Signed)
 Patient is stilled admitted, should we cancel her 4/3 appt with Danielle?

## 2024-02-09 NOTE — Evaluation (Signed)
 Speech Language Pathology Assessment and Plan  Patient Details  Name: Alison Lopez MRN: [id number] Date of Birth: 01-23-1956  SLP Diagnosis: Aphasia;Speech and Language deficits;Apraxia;Cognitive Impairments  Rehab Potential: Fair ELOS: TBD    Today's Date: 02/09/2024 SLP Individual Time: 0900-1000 SLP Individual Time Calculation (min): 60 min   Hospital Problem: Principal Problem:   SDH (subdural hematoma) (HCC)  Past Medical History:  Past Medical History:  Diagnosis Date   Anxiety    Arthritis    Collagen vascular disease (HCC)    COPD (chronic obstructive pulmonary disease) (HCC)    DDD (degenerative disc disease), lumbar    Dementia (HCC)    Depression    Diabetes mellitus without complication (HCC)    DVT (deep venous thrombosis) (HCC)    Fibromyalgia    GERD (gastroesophageal reflux disease)    Graves' disease with exophthalmos    HLD (hyperlipidemia)    Hypertension    Hypothyroidism    Lupus    Osteoporosis    Past Surgical History:  Past Surgical History:  Procedure Laterality Date   ABDOMINAL HYSTERECTOMY     BURR HOLE Bilateral 01/30/2024   Procedure: CREATION, CRANIAL BURR HOLE, SUBDURAL HEMATOMA EVACUATIOIN;  Surgeon: Venetia Night, MD;  Location: [facility] ORS;  Service: Neurosurgery;  Laterality: Bilateral;  need ventriculostomy x 2   COLONOSCOPY WITH PROPOFOL N/A 06/11/2021   Procedure: COLONOSCOPY WITH PROPOFOL;  Surgeon: Regis Bill, MD;  Location: [facility];  Service: Endoscopy;  Laterality: N/A;  prefers afternoon Eliquis   COLONOSCOPY WITH PROPOFOL N/A 12/19/2022   Procedure: COLONOSCOPY WITH PROPOFOL;  Surgeon: Regis Bill, MD;  Location: [facility] ENDOS[facility];  Service: Endoscopy;  Laterality: N/A;   ESOPHAGOGASTRODUODENOSCOPY N/A 06/11/2021   Procedure: ESOPHAGOGASTRODUODENOSCOPY (EGD);  Surgeon: Regis Bill, MD;  Location: [facility] ENDOS[facility];  Service: Endoscopy;  Laterality: N/A;   EYE SURGERY Right    IVC FILTER REMOVAL N/A  07/21/2019   Procedure: IVC FILTER REMOVAL;  Surgeon: Annice Needy, MD;  Location: [facility];  Service: Cardiovascular;  Laterality: N/A;   LOWER EXTREMITY INTERVENTION Right 04/22/2019   Procedure: IVC Filter Insertion with Right Lower Extremity Venous Lysis;  Surgeon: Annice Needy, MD;  Location: [facility];  Service: Cardiovascular;  Laterality: Right;   OOPHORECTOMY      Assessment / Plan / Recommendation Clinical Impression Pt is a 68 year old right-handed female with history of DVT maintained on Eliquis, mention maintained on Aricept chronic thrombocytopenia followed by Dr Smith Robert, B12 deficiency, hypertension, diabetes mellitus with retinopathy and legally blind, quit smoking 23 years ago. Per chart review patient lives with her children. 4 steps to entry of home. Ambulates household distances without assistive device. Presented 01/29/2024 to [facility] with altered mental status, right-sided weakness and urinary/fecal incontinence. Family denied any recent fall or trauma. Cranial CT scan positive for bilateral mixed density subdural hematoma somewhat biconvex with a 17 mm maximum thickness on the left and 14 mm maximum thickness on the right. Subsequent mass effect of both hemispheres although no significant midline shift. Basilar cisterns remain patent. No skull fracture or other acute intracranial abnormality. Admission chemistries unremarkable except glucose 135, alcohol negative, platelets 52,000. Underwent bilateral frontal burr hole for drainage of subdural hematoma 01/30/2024 per Dr. Marcell Barlow. Latest follow-up cranial CT scan 01/31/2024 showed postoperative substantial decrease in bilateral subdural hematomas. Residual blood up to 11 mm on the left, 4-5 mm on the right. Significantly regressed intracranial mass effect. Residual rightward midline shift now 1-2 mm. Marland Kitchen  Therapy evaluations completed due to patient decreased functional mobility and right-sided  weakness was admitted for a comprehensive rehab program.   Cognitive Linguistic Eval: Language eval completed d/t R > L weakness and bilateral nature of SDH. The Bedside Western Aphasia Battery (WAB) revealed moderate aphasia; expressive > receptive. She demonstrated adequate comprehension of simple information, though mild to moderate deficits noted as information increased in complexity. Moderate word finding deficits noted in conversation and structured naming subtests. Mildly slurred speech noted d/t R facial weakness and edentulous state. Additionally, pt presented w/ intermittent irregular articulatory breakdowns that appeared c/w apraxia. Slow rate of speech noted as well, though difficult to differentiate between motor speech production deficits, processing deficits, and/or word finding deficits. Per EMR, pt presented w/ memory deficits at baseline and was set to receive OP ST evaluation prior to this hospitalization. Some functional problem solving and processing deficits noted, though able to assess true cognitive function otherwise d/t aphasia. Additionally, pt reported significant vision loss at baseline, which further complicates her ability to complete ADLs/IADLs. Recommend cont ST to target cognitive-linguistic deficits, facilitate improved communication, maximize pt independence, and reduce caregiver burden.  Bedside Swallow: Prolonged mastication noted d/t edentulous state, though no concerns re oropharyngeal swallow function otherwise. No overt s/s of airway invasion noted w/ regular textures (saltines/peanut butter) and thin liquids (water) via straw. Dysphagia intervention not warranted, recommend cont regular diet/thin liquids. She will continue to require assistance w/ self feeding d/t significant visual deficits.     Skilled Therapeutic Interventions          SLP facilitated a cognitive-linguistic evaluation and brief bedside swallow screen to assess pt's cognitive-communication skills  and determine need for additional skilled ST services. See above for more information.    SLP Assessment  Patient will need skilled Speech Lanaguage Pathology Services during CIR admission    Recommendations  Liquid Administration via: Straw;Cup Medication Administration: Whole meds with puree Supervision: Staff to assist with self feeding;Full supervision/cueing for compensatory strategies Compensations: Minimize environmental distractions;Slow rate;Small sips/bites Postural Changes and/or Swallow Maneuvers: Seated upright 90 degrees Oral Care Recommendations: Oral care BID;Staff/trained caregiver to provide oral care Recommendations for Other Services: Neuropsych consult;Therapeutic Recreation consult Therapeutic Recreation Interventions: Pet therapy Patient destination: Home Follow up Recommendations: Outpatient SLP;24 hour supervision/assistance Equipment Recommended: To be determined    SLP Frequency 3 to 5 out of 7 days   SLP Duration  SLP Intensity  SLP Treatment/Interventions TBD  Minumum of 1-2 x/day, 30 to 90 minutes  Cognitive remediation/compensation;Internal/external aids;Speech/Language facilitation;Therapeutic Exercise;Cueing hierarchy;Functional tasks;Patient/family education;Therapeutic Activities    Pain Pain Assessment Pain Scale: 0-10 Pain Score: 0-No pain  Prior Functioning Cognitive/Linguistic Baseline: Baseline deficits Type of Home: Mobile home  Lives With: Daughter Available Help at Discharge: Family;Available PRN/intermittently Education: HS graduate Vocation: Retired  Architectural technologist Overall Cognitive Status: History of cognitive impairments - at baseline Arousal/Alertness: Awake/alert Orientation Level: Oriented to person;Oriented to place Year: 2025 (given 2 choices) Month: April Day of Week: Incorrect Attention: Focused;Sustained Focused Attention: Appears intact Sustained Attention: Appears intact Sustained Attention  Impairment: Verbal basic Awareness: Impaired Problem Solving: Impaired Safety/Judgment: Impaired Comments: difficult to assess given language deficits  Comprehension Auditory Comprehension Overall Auditory Comprehension: Impaired Yes/No Questions: Impaired Basic Immediate Environment Questions: 75-100% accurate Complex Questions: 75-100% accurate Commands: Impaired One Step Basic Commands: 75-100% accurate Two Step Basic Commands: 75-100% accurate Multistep Basic Commands: 50-74% accurate Conversation: Simple Interfering Components: Hearing;Visual impairments EffectiveTechniques: Extra processing time;Repetition;Increased volume Visual Recognition/Discrimination Discrimination: Not tested Reading Comprehension Reading  Status: Unable to assess (comment) (d/t visual deficits) Expression Expression Primary Mode of Expression: Verbal Verbal Expression Overall Verbal Expression: Impaired Initiation: No impairment Level of Generative/Spontaneous Verbalization: Sentence Repetition: Impaired Level of Impairment: Sentence level Naming: Impairment Responsive: 51-75% accurate Confrontation: Impaired Other Naming Comments: moderately impaired Verbal Errors: Semantic paraphasias;Phonemic paraphasias Pragmatics: No impairment Effective Techniques: Open ended questions;Sentence completion;Semantic cues Non-Verbal Means of Communication: Not applicable Written Expression Dominant Hand: Right Oral Motor Oral Motor/Sensory Function Overall Oral Motor/Sensory Function: Mild impairment Facial ROM: Reduced right Facial Symmetry: Abnormal symmetry right Facial Strength: Reduced right Facial Sensation: Within Functional Limits Lingual ROM: Within Functional Limits Lingual Strength: Within Functional Limits Lingual Sensation: Within Functional Limits Motor Speech Overall Motor Speech: Impaired Respiration: Within functional limits Phonation: Normal Resonance: Within functional  limits Articulation: Impaired Level of Impairment: Sentence Intelligibility: Intelligibility reduced Word: 75-100% accurate Phrase: 75-100% accurate Sentence: 75-100% accurate Conversation: 75-100% accurate Motor Planning: Impaired Level of Impairment: Secretary/administrator Errors: Inconsistent Effective Techniques: Pause  Care Tool Care Tool Cognition Ability to hear (with hearing aid or hearing appliances if normally used Ability to hear (with hearing aid or hearing appliances if normally used): 1. Minimal difficulty - difficulty in some environments (e.g. when person speaks softly or setting is noisy)   Expression of Ideas and Wants Expression of Ideas and Wants: 2. Frequent difficulty - frequently exhibits difficulty with expressing needs and ideas   Understanding Verbal and Non-Verbal Content Understanding Verbal and Non-Verbal Content: 3. Usually understands - understands most conversations, but misses some part/intent of message. Requires cues at times to understand  Memory/Recall Ability Memory/Recall Ability : That he or she is in a hospital/hospital unit   Motor Speech Assessment  Intelligibility: Intelligibility reduced Word: 75-100% accurate Phrase: 75-100% accurate Sentence: 75-100% accurate Conversation: 75-100% accurate  Bedside Swallowing Assessment General Date of Onset: 01/29/24 Previous Swallow Assessment: n/a Diet Prior to this Study: Regular;Thin liquids (Level 0) Respiratory Status: Room air Behavior/Cognition: Alert;Cooperative;Pleasant mood Oral Cavity - Dentition: Poor condition Self-Feeding Abilities: Needs assist Patient Positioning: Upright in chair/Tumbleform Baseline Vocal Quality: Normal   Short Term Goals: Week 1: SLP Short Term Goal 1 (Week 1): Pt will complete functional naming tasks w/ modA SLP Short Term Goal 2 (Week 1): Pt will utilize compensatory speech strategies at sentence level w/ modA SLP Short Term Goal 3 (Week 1): Pt will  comprehend mildly complex auditory information w/ modA SLP Short Term Goal 4 (Week 1): pt will solve problems during functional cognitive tasks w/ modA  Refer to Care Plan for Long Term Goals  Recommendations for other services: Neuropsych and Therapeutic Recreation  Pet therapy  Discharge Criteria: Patient will be discharged from SLP if patient refuses treatment 3 consecutive times without medical reason, if treatment goals not met, if there is a change in medical status, if patient makes no progress towards goals or if patient is discharged from hospital.  The above assessment, treatment plan, treatment alternatives and goals were discussed and mutually agreed upon: by patient  Pati Gallo 02/09/2024, 4:36 PM

## 2024-02-09 NOTE — Evaluation (Signed)
 Occupational Therapy Assessment and Plan  Patient Details  Name: Alison Lopez MRN: [id number] Date of Birth: [birth year]  OT Diagnosis: apraxia, blindness and low vision, cognitive deficits, disturbance of vision, hemiplegia affecting dominant side, and muscle weakness (generalized) Rehab Potential: Rehab Potential (ACUTE ONLY): Good ELOS: 12-14 days   Today's Date: 02/09/2024 OT Individual Time: 4098-1191 OT Individual Time Calculation (min): 71 min     Hospital Problem: Principal Problem:   SDH (subdural hematoma) (HCC)   Past Medical History:  Past Medical History:  Diagnosis Date   Anxiety    Arthritis    Collagen vascular disease (HCC)    COPD (chronic obstructive pulmonary disease) (HCC)    DDD (degenerative disc disease), lumbar    Dementia (HCC)    Depression    Diabetes mellitus without complication (HCC)    DVT (deep venous thrombosis) (HCC)    Fibromyalgia    GERD (gastroesophageal reflux disease)    Graves' disease with exophthalmos    HLD (hyperlipidemia)    Hypertension    Hypothyroidism    Lupus    Osteoporosis    Past Surgical History:  Past Surgical History:  Procedure Laterality Date   ABDOMINAL HYSTERECTOMY     BURR HOLE Bilateral 01/30/2024   Procedure: CREATION, CRANIAL BURR HOLE, SUBDURAL HEMATOMA EVACUATIOIN;  Surgeon: Venetia Night, MD;  Location: [facility] ORS;  Service: Neurosurgery;  Laterality: Bilateral;  need ventriculostomy x 2   COLONOSCOPY WITH PROPOFOL N/A 06/11/2021   Procedure: COLONOSCOPY WITH PROPOFOL;  Surgeon: Regis Bill, MD;  Location: [facility] ENDOSCOPY;  Service: Endoscopy;  Laterality: N/A;  prefers afternoon Eliquis   COLONOSCOPY WITH PROPOFOL N/A 12/19/2022   Procedure: COLONOSCOPY WITH PROPOFOL;  Surgeon: Regis Bill, MD;  Location: [facility] ENDOSCOPY;  Service: Endoscopy;  Laterality: N/A;   ESOPHAGOGASTRODUODENOSCOPY N/A 06/11/2021   Procedure: ESOPHAGOGASTRODUODENOSCOPY (EGD);  Surgeon: Regis Bill, MD;   Location: [facility] ENDOSCOPY;  Service: Endoscopy;  Laterality: N/A;   EYE SURGERY Right    IVC FILTER REMOVAL N/A 07/21/2019   Procedure: IVC FILTER REMOVAL;  Surgeon: Annice Needy, MD;  Location: [facility] INVASIVE CV LAB;  Service: Cardiovascular;  Laterality: N/A;   LOWER EXTREMITY INTERVENTION Right 04/22/2019   Procedure: IVC Filter Insertion with Right Lower Extremity Venous Lysis;  Surgeon: Annice Needy, MD;  Location: [facility] INVASIVE CV LAB;  Service: Cardiovascular;  Laterality: Right;   OOPHORECTOMY      Assessment & Plan Clinical Impression: Alison Lopez is a 68 year old right-handed female with history of DVT maintained on Eliquis, mention maintained on Aricept chronic thrombocytopenia followed by Dr Smith Robert, B12 deficiency, hypertension, diabetes mellitus with retinopathy and legally blind, quit smoking 23 years ago.  Per chart review patient lives with her children.  4 steps to entry of home.  Ambulates household distances without assistive device.  Presented 01/29/[facility] to [facility] with altered mental status, right-sided weakness and urinary/fecal incontinence.  Family denied any recent fall or trauma.  Cranial CT scan positive for bilateral mixed density subdural hematoma somewhat biconvex with a 17 mm maximum thickness on the left and 14 mm maximum thickness on the right.  Subsequent mass effect of both hemispheres although no significant midline shift.  Basilar cisterns remain patent.  No skull fracture or other acute intracranial abnormality.  Admission chemistries unremarkable except glucose 135, alcohol negative, platelets 52,000.  Underwent bilateral frontal burr hole for drainage of subdural hematoma 01/30/2024 per Dr. Marcell Barlow.  Latest follow-up cranial CT scan 01/31/2024 showed postoperative substantial decrease  in bilateral subdural hematomas.  Residual blood up to 11 mm on the left, 4-5 mm on the right.  Significantly regressed intracranial mass effect.   Residual  rightward midline shift now 1-2 mm.  .  Therapy evaluations completed due to patient decreased functional mobility and right-sided weakness was admitted for a comprehensive rehab program.  Patient transferred to CIR on 02/08/2024 .    Patient currently requires mod with basic self-care skills and IADL secondary to muscle weakness, decreased cardiorespiratoy endurance, decreased coordination and decreased motor planning, decreased visual acuity, right side neglect, decreased initiation, decreased attention, decreased awareness, decreased problem solving, decreased safety awareness, decreased memory, and delayed processing, and decreased standing balance, decreased postural control, hemiplegia, and decreased balance strategies.  Prior to hospitalization, patient could complete ADLs with independent .  Patient will benefit from skilled intervention to increase independence with basic self-care skills prior to discharge home with care partner.  Anticipate patient will require 24 hour supervision and follow up home health.  OT - End of Session Activity Tolerance: Tolerates 30+ min activity with multiple rests Endurance Deficit: Yes Endurance Deficit Description: generalized deconditioning OT Assessment Rehab Potential (ACUTE ONLY): Good OT Barriers to Discharge: Decreased caregiver support OT Barriers to Discharge Comments: unclear if 24/7 supervision is available OT Patient demonstrates impairments in the following area(s): Balance;Endurance;Motor;Perception;Safety;Skin Integrity OT Basic ADL's Functional Problem(s): Eating;Grooming;Bathing;Dressing;Toileting OT Transfers Functional Problem(s): Toilet;Tub/Shower OT Additional Impairment(s): Fuctional Use of Upper Extremity OT Plan OT Intensity: Minimum of 1-2 x/day, 45 to 90 minutes OT Frequency: 5 out of 7 days OT Duration/Estimated Length of Stay: 12-14 days   OT Evaluation Precautions/Restrictions  Precautions Precautions:  Fall Restrictions Weight Bearing Restrictions Per Provider Order: No General Chart Reviewed: Yes Family/Caregiver Present: No Home Living/Prior Functioning Home Living Family/patient expects to be discharged to:: Skilled nursing facility Living Arrangements: Children Available Help at Discharge: Family, Available PRN/intermittently Type of Home: House Home Access: Stairs to enter Secretary/administrator of Steps: 4 Entrance Stairs-Rails: Right Home Layout: Two level Bathroom Shower/Tub: Psychologist, counselling  Lives With: Daughter IADL History Homemaking Responsibilities: No Current License: No Mode of Transportation: Set designer Occupation: Retired Prior Function Level of Independence: Independent with basic ADLs, Requires assistive device for independence, Needs assistance with gait Vocation: Retired Administrator, sports Baseline Vision/History: 2 Legally blind Ability to See in Adequate Light: 4 Severely impaired Patient Visual Report: Blurring of vision Vision Assessment?: Yes Eye Alignment: Impaired (comment) Ocular Range of Motion: Impaired-to be further tested in functional context Alignment/Gaze Preference: Within Defined Limits Tracking/Visual Pursuits: Impaired - to be further tested in functional context Saccades: Within functional limits Convergence: Impaired (comment) Additional Comments: Very little vision in the R eye- baseline, L blurry. Hx of Perception  Perception: Impaired Praxis Praxis: Impaired Praxis Impairment Details: Motor planning Cognition Cognition Overall Cognitive Status: Impaired/Different from baseline Arousal/Alertness: Awake/alert Memory: Impaired Memory Impairment: Decreased recall of new information;Storage deficit Attention: Sustained Sustained Attention: Impaired Sustained Attention Impairment: Verbal basic;Functional basic Awareness: Impaired Awareness Impairment: Intellectual impairment Problem Solving: Impaired Problem Solving Impairment: Verbal  basic;Functional basic Executive Function:  (all impaired) Safety/Judgment: Impaired Brief Interview for Mental Status (BIMS) Repetition of Three Words (First Attempt): 3 Temporal Orientation: Year: Missed by more than 5 years Temporal Orientation: Month: Missed by more than 1 month Temporal Orientation: Day: Incorrect Recall: "Sock": No, could not recall Recall: "Blue": No, could not recall Recall: "Bed": No, could not recall BIMS Summary Score: 3 Sensation Sensation Light Touch: Appears Intact Proprioception: Appears Intact Coordination Gross Motor Movements are Fluid  and Coordinated: No Fine Motor Movements are Fluid and Coordinated: No Coordination and Movement Description: R hemi with inattention Finger Nose Finger Test: Severely impaired- combo of visual deficits and R inattention Motor  Motor Motor: Hemiplegia  Trunk/Postural Assessment  Cervical Assessment Cervical Assessment: Exceptions to [facility] (forward head) Thoracic Assessment Thoracic Assessment: Exceptions to [facility] (rounded shoulders) Lumbar Assessment Lumbar Assessment: Exceptions to [facility] (posterior pelvic tilt) Postural Control Postural Control: Deficits on evaluation Righting Reactions: delayed  Balance Balance Balance Assessed: Yes Static Sitting Balance Static Sitting - Balance Support: No upper extremity supported;Feet supported Static Sitting - Level of Assistance: 6: Modified independent (Device/Increase time) Dynamic Sitting Balance Dynamic Sitting - Balance Support: No upper extremity supported;Feet supported Dynamic Sitting - Level of Assistance: 5: Stand by assistance Static Standing Balance Static Standing - Balance Support: No upper extremity supported Static Standing - Level of Assistance: 4: Min assist Dynamic Standing Balance Dynamic Standing - Balance Support: No upper extremity supported Dynamic Standing - Level of Assistance: 4: Min assist Extremity/Trunk Assessment RUE Assessment RUE  Assessment: Exceptions to [facility] General Strength Comments: R inattention. arthritic changes with ulnar drift. Strength 3+.5. Ataxic RUE RUE Body System: Neuro Brunstrum levels for arm and hand: Arm;Hand Brunstrum level for arm: Stage V Relative Independence from Synergy Brunstrum level for hand: Stage VI Isolated joint movements LUE Assessment LUE Assessment: Exceptions to [facility] General Strength Comments: arthritic changes with ulnar drift. Strength 3+.5  Care Tool Care Tool Self Care Eating   Eating Assist Level: Moderate Assistance - Patient 50 - 74%    Oral Care    Oral Care Assist Level: Minimal Assistance - Patient > 75%    Bathing   Body parts bathed by patient: Right arm;Left arm;Chest;Abdomen;Front perineal area;Left upper leg;Right upper leg;Face Body parts bathed by helper: Buttocks;Right lower leg;Left lower leg   Assist Level: Moderate Assistance - Patient 50 - 74%    Upper Body Dressing(including orthotics)   What is the patient wearing?: Pull over shirt   Assist Level: Moderate Assistance - Patient 50 - 74%    Lower Body Dressing (excluding footwear)   What is the patient wearing?: Pants Assist for lower body dressing: Moderate Assistance - Patient 50 - 74%    Putting on/Taking off footwear   What is the patient wearing?: Non-skid slipper socks Assist for footwear: Moderate Assistance - Patient 50 - 74%       Care Tool Toileting Toileting activity   Assist for toileting: Moderate Assistance - Patient 50 - 74%     Care Tool Bed Mobility Roll left and right activity   Roll left and right assist level: Supervision/Verbal cueing    Sit to lying activity   Sit to lying assist level: Supervision/Verbal cueing    Lying to sitting on side of bed activity   Lying to sitting on side of bed assist level: the ability to move from lying on the back to sitting on the side of the bed with no back support.: Supervision/Verbal cueing     Care Tool Transfers Sit to  stand transfer   Sit to stand assist level: Minimal Assistance - Patient > 75%    Chair/bed transfer   Chair/bed transfer assist level: Minimal Assistance - Patient > 75%     Toilet transfer   Assist Level: Minimal Assistance - Patient > 75%     Care Tool Cognition  Expression of Ideas and Wants Expression of Ideas and Wants: 3. Some difficulty - exhibits some difficulty with expressing needs  and ideas (e.g, some words or finishing thoughts) or speech is not clear  Understanding Verbal and Non-Verbal Content Understanding Verbal and Non-Verbal Content: 2. Sometimes understands - understands only basic conversations or simple, direct phrases. Frequently requires cues to understand   Memory/Recall Ability Memory/Recall Ability : That he or she is in a hospital/hospital unit   Refer to Care Plan for Long Term Goals  SHORT TERM GOAL WEEK 1 OT Short Term Goal 1 (Week 1): Pt will don shirt with min A OT Short Term Goal 2 (Week 1): Pt will don pants with min A OT Short Term Goal 3 (Week 1): Pt will complete stand pivot to the toilet with min A OT Short Term Goal 4 (Week 1): Pt will increase emergent awareness by stating she will need assist with ADLs  Recommendations for other services: None    Skilled Therapeutic Intervention ADL ADL Eating: Moderate assistance Where Assessed-Eating: Bed level Grooming: Minimal assistance Where Assessed-Grooming: Sitting at sink Upper Body Bathing: Minimal assistance;Moderate cueing Where Assessed-Upper Body Bathing: Shower Lower Body Bathing: Minimal assistance;Moderate cueing Where Assessed-Lower Body Bathing: Shower Upper Body Dressing: Moderate cueing;Moderate assistance Lower Body Dressing: Moderate assistance;Moderate cueing Where Assessed-Lower Body Dressing: Sitting at sink Toileting: Moderate assistance Where Assessed-Toileting: Teacher, adult education: Moderate assistance Toilet Transfer Method: Ambulating Tub/Shower Transfer: Unable  to assess Film/video editor: Moderate cueing;Moderate assistance Film/video editor Method: Ambulating Mobility  Bed Mobility Bed Mobility: Supine to Sit;Sit to Supine Supine to Sit: Supervision/Verbal cueing Sit to Supine: Supervision/Verbal cueing Transfers Sit to Stand: Minimal Assistance - Patient > 75% Stand to Sit: Minimal Assistance - Patient > 75%  Skilled OT evaluation completed with the creation of pt centered OT POC. Pt educated on condition, ELOS, rehab expectations, and fall risk reduction strategies throughout session- anticipate poor carryover as pt is very confused. Pt completed shower level ADLs as described above. Pt limited by apraxia, R hemi (mild) with R inattention/neglect, low vision, ataxia, and generalized weakness. She is very motivated to participate and completed 200 ft of functional mobility with min A overall. Pt was left sitting up in the w/c with all needs met, chair alarm set, and call bell within reach.    Discharge Criteria: Patient will be discharged from OT if patient refuses treatment 3 consecutive times without medical reason, if treatment goals not met, if there is a change in medical status, if patient makes no progress towards goals or if patient is discharged from hospital.  The above assessment, treatment plan, treatment alternatives and goals were discussed and mutually agreed upon: by patient  Crissie Reese 02/09/2024, 10:32 AM

## 2024-02-09 NOTE — Plan of Care (Signed)
  Problem: RH Comprehension Communication Goal: LTG Patient will comprehend basic/complex auditory (SLP) Description: LTG: Patient will comprehend basic/complex auditory information with cues (SLP). Flowsheets (Taken 02/09/2024 1655) LTG: Patient will comprehend: Complex auditory information LTG: Patient will comprehend auditory information with cueing (SLP): Minimal Assistance - Patient > 75% Note: Mildly complex information    Problem: RH Expression Communication Goal: LTG Patient will increase word finding of common (SLP) Description: LTG:  Patient will increase word finding of common objects/daily info/abstract thoughts with cues using compensatory strategies (SLP). Flowsheets (Taken 02/09/2024 1655) LTG: Patient will increase word finding of common (SLP): Minimal Assistance - Patient > 75% Patient will use compensatory strategies to increase word finding of:  Common objects  Daily info   Problem: RH Problem Solving Goal: LTG Patient will demonstrate problem solving for (SLP) Description: LTG:  Patient will demonstrate problem solving for basic/complex daily situations with cues  (SLP) Flowsheets (Taken 02/09/2024 1655) LTG Patient will demonstrate problem solving for: Minimal Assistance - Patient > 75%

## 2024-02-09 NOTE — Progress Notes (Signed)
 PROGRESS NOTE   Subjective/Complaints:  No events overnight.  Some mild intermittent bradycardia in the 50s, but otherwise vitals are well-controlled. A.m. labs significant for low albumin, otherwise stable from priors.  Platelets remain low, 76 from 69. Ate 50% of breakfast this a.m., with assistance Mixed continence of bowel bladder, last bowel movement 3-30, medium   On exam, patient has no concerns, complaints.  Endorses that she is blind in both eyes at baseline, can see some traces/outline shadows.   Unable to obtain ROS due to patient cognitive status.    Objective:   No results found. Recent Labs    02/07/24 0443 02/09/24 0544  WBC 6.7 8.3  HGB 11.2* 11.0*  HCT 32.4* 32.5*  PLT 69* 76*   Recent Labs    02/07/24 0443 02/09/24 0544  NA 137 138  K 4.7 4.3  CL 104 106  CO2 28 25  GLUCOSE 98 97  BUN 21 16  CREATININE 1.01* 1.04*  CALCIUM 8.7* 8.8*    Intake/Output Summary (Last 24 hours) at 02/09/2024 0915 Last data filed at 02/09/2024 0700 Gross per 24 hour  Intake 230 ml  Output --  Net 230 ml        Physical Exam: Vital Signs Blood pressure 136/75, pulse (!) 57, temperature 98.5 F (36.9 C), resp. rate 18, height 4\' 9"  (1.448 m), weight 48.1 kg, SpO2 96%. Constitutional: No apparent distress. Appropriate appearance for age.  Sitting upright in bedside chair. HENT: Burr hole incisions intact.  Glasses donned, bilateral exotropia.  Significant deficits in all visual fields, can grossly track bilateral beat but cannot perform finger-to-nose on either side.  Cardiovascular: RRR, no murmurs/rub/gallops. No Edema. Peripheral pulses 2+  Respiratory: CTAB. No rales, rhonchi, or wheezing. On RA.  Abdomen: + bowel sounds, normoactive. No distention or tenderness.  Skin: C/D/I. No apparent lesions. MSK:      No apparent deformity.  Full active range of motion all 4 extremities  Neurologic: Dense right  homonymous hemianopsia --not appreciated today on exam, but complicated by bilateral blindness as above along with general right-sided hemineglect  motor strength is 4/5 in bilateral deltoid, bicep, tricep, grip, hip flexor, knee extensors, ankle dorsiflexor and plantar flexor  Sensory exam normal sensation to light touch and  in bilateral upper and lower extremities Unable to perform finger-to-nose with either extremity or in any visual field  Right sided testing limited by right-sided hemineglect       Assessment/Plan: 1. Functional deficits which require 3+ hours per day of interdisciplinary therapy in a comprehensive inpatient rehab setting. Physiatrist is providing close team supervision and 24 hour management of active medical problems listed below. Physiatrist and rehab team continue to assess barriers to discharge/monitor patient progress toward functional and medical goals  Care Tool:  Bathing              Bathing assist       Upper Body Dressing/Undressing Upper body dressing        Upper body assist      Lower Body Dressing/Undressing Lower body dressing            Lower body assist  Toileting Toileting    Toileting assist       Transfers Chair/bed transfer  Transfers assist           Locomotion Ambulation   Ambulation assist              Walk 10 feet activity   Assist           Walk 50 feet activity   Assist           Walk 150 feet activity   Assist           Walk 10 feet on uneven surface  activity   Assist           Wheelchair     Assist               Wheelchair 50 feet with 2 turns activity    Assist            Wheelchair 150 feet activity     Assist          Blood pressure 136/75, pulse (!) 57, temperature 98.5 F (36.9 C), resp. rate 18, height 4\' 9"  (1.448 m), weight 48.1 kg, SpO2 96%.  Medical Problem List and Plan: 1. Functional deficits  secondary to bilateral left greater than right subdural hematoma.  Status post bur hole 01/30/2024.   Plan to remove staples/sutures 02/13/19/2025, patient with dense right homonymous hemianopsia.  Cognitive deficits question due to dementia versus worsening due to traumatic brain injury             -patient may not shower              -ELOS/Goals: 12 to 14 days with supervision to min assist goals cognitively and supervision goals -- DC date pending evals   - Stable to continue CIR  4/1: Aphasic, aparaxic, and dysarthric with SLP. Cull supervision for feeding. Limited by vision deficits. Recommending soft touch collar. Other evals pending.   2.  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             -antiplatelet therapy: N/A  3. Pain Management: N/A  4. Mood/Behavior/Sleep/Dementia: Zoloft 25 mg daily, Aricept 10 mg nightly Namenda 5 mg daily.             -antipsychotic agents: N/A   - delirium precautions  5. Neuropsych/cognition: This patient is not capable of making decisions on her own behalf.  6. Skin/Wound Care: Routine skin checks - suture removal 02/13/2024  7. Fluids/Electrolytes/Nutrition: Routine in and outs with follow-up chemistries   - AM labs stable  8.  Diabetes mellitus with retinopathy/legally blind.  Latest hemoglobin A1c 4.8.  SSI.  -- 4-1: Patient blind in all fields, unable to appreciate prior reported homonomous hemianopsia today.  Continue monitoring.  9.  Hyperlipidemia.  Crestor  10.  Hypothyroidism.  Synthroid  11.  Chronic thrombocytopenia.  Patient received Decadron 40 mg IV daily 4 days.  Follow-up hematology services.  Admission platelets of 52,000.  Latest platelet count 69,000. Would hold enoxaparin if platelet count less than 40,000  - 4/1: plt up to 76; monitor  12.  B12 deficiency.  Continue supplement  13. Bloody stool/Black Bms.   - HgB stable  11   - Add protonix 40 mg bid   - FOBT    14. Mixed continence   - Timed toiletting   - PVRs x3 days  LOS: 1 days A  FACE TO FACE EVALUATION WAS PERFORMED  Angelina Sheriff 02/09/2024, 9:15 AM

## 2024-02-10 DIAGNOSIS — S065XAA Traumatic subdural hemorrhage with loss of consciousness status unknown, initial encounter: Secondary | ICD-10-CM | POA: Diagnosis not present

## 2024-02-10 LAB — GLUCOSE, CAPILLARY
Glucose-Capillary: 76 mg/dL (ref 70–99)
Glucose-Capillary: 99 mg/dL (ref 70–99)
Glucose-Capillary: 143 mg/dL — ABNORMAL HIGH (ref 70–99)
Glucose-Capillary: 111 mg/dL — ABNORMAL HIGH (ref 70–99)

## 2024-02-10 NOTE — Progress Notes (Signed)
 Physical Therapy Session Note  Patient Details  Name: Alison Lopez MRN: [id number] Date of Birth: 1955/12/30  Today's Date: 02/10/2024 PT Individual Time: 0832-0929 PT Individual Time Calculation (min): 57 min   Short Term Goals: Week 1:  PT Short Term Goal 1 (Week 1): Pt will transfer sit to stand w/ CGA consistently. PT Short Term Goal 2 (Week 1): Pt will amb 300' / LRAD and min A PT Short Term Goal 3 (Week 1): Pt will negotiate 6 steps w/ 1 rail and min A.  Skilled Therapeutic Interventions/Progress Updates:     Pt received seated in WC and agrees to therapy. No complaint of pain. WC transport to gym for time management. Pt performs sit to stand with CGA and cues for initiation. Pt ambulates x350' with minA primarily to direct pt secondary to blindness, with cues for upright posture to improve balance. Pt takes seated rest break. Pt stands and ambulates x400' with similar assistance and cues. Pt requires increased assistance when navigating obstacles and having to make tight turns in crowded environment. Following rest break, pt ambulates x500' with similar assistance and cues. Performed for gait training as well as balance and endurance training. PT provides tactile cueing at hips and upper trunk for stability and optimal posture, as well as directing pt secondary to visual impairments. WC transport back to room. Pt performs stand step transfer back to bed with CGA and cues for positioning. Let supine with alarm intact and all needs within reach.   Therapy Documentation Precautions:  Precautions Precautions: Fall Precaution/Restrictions Comments: blind. Restrictions Weight Bearing Restrictions Per Provider Order: No   Therapy/Group: Individual Therapy  Beau Fanny, PT, DPT 02/10/2024, 8:54 AM

## 2024-02-10 NOTE — Progress Notes (Signed)
 Occupational Therapy Session Note  Patient Details  Name: Alison Lopez MRN: [id number] Date of Birth: Aug 29, 1956  Today's Date: 02/10/2024 OT Individual Time: 5621-3086 OT Individual Time Calculation (min): 40 min    Short Term Goals: Week 1:  OT Short Term Goal 1 (Week 1): Pt will don shirt with min A OT Short Term Goal 2 (Week 1): Pt will don pants with min A OT Short Term Goal 3 (Week 1): Pt will complete stand pivot to the toilet with min A OT Short Term Goal 4 (Week 1): Pt will increase emergent awareness by stating she will need assist with ADLs  Skilled Therapeutic Interventions/Progress Updates:    Pt received supine with no c/o pain, agreeable to OT session. She came to EOB with min A. She completed functional mobility into the bathroom with R HHA- requiring min A with posterior bias. She completed toileting tasks with mod cueing for sequencing and initiation. She voided urine. Brief changed 2/2 incontinence. She completed transfer back to the w/c and initiated eating breakfast. Focused on self feeding techniques 2/2 low vision. Instructed pt in clock method to find items on plate, as well as forced integration of the RUE to promote improved R attention. She was able to follow clock instructions with mod cueing/facilitation. She was able to hold fork in her RUE with min facilitation and accurately bring to mouth. She demonstrated poor problem solving/error recognition even with the LUE. Also poor initiation to ask for wants/needs. Pt was left sitting up in the w/c with all needs met, chair alarm set, and call bell within reach.   Therapy Documentation Precautions:  Precautions Precautions: Fall Precaution/Restrictions Comments: blind. Restrictions Weight Bearing Restrictions Per Provider Order: No  Therapy/Group: Individual Therapy  Crissie Reese 02/10/2024, 6:25 AM

## 2024-02-10 NOTE — Care Management (Signed)
 [facility] Individual Statement of Services  Patient Name:  Alison Lopez  Date:  02/10/2024  Welcome to the [facility].  Our goal is to provide you with an individualized program based on your diagnosis and situation, designed to meet your specific needs.  With this comprehensive rehabilitation program, you will be expected to participate in at least 3 hours of rehabilitation therapies Monday-Friday, with modified therapy programming on the weekends.  Your rehabilitation program will include the following services:  Physical Therapy (PT), Occupational Therapy (OT), Speech Therapy (ST), 24 hour per day rehabilitation nursing, Therapeutic Recreaction (TR), Psychology, Neuropsychology, Care Coordinator, Rehabilitation Medicine, Nutrition Services, Pharmacy Services, and Other  Weekly team conferences will be held on Tuesdays to discuss your progress.  Your Inpatient Rehabilitation Care Coordinator will talk with you frequently to get your input and to update you on team discussions.  Team conferences with you and your family in attendance may also be held.  Expected length of stay: 12-14 days    Overall anticipated outcome: Supervision  Depending on your progress and recovery, your program may change. Your Inpatient Rehabilitation Care Coordinator will coordinate services and will keep you informed of any changes. Your Inpatient Rehabilitation Care Coordinator's name and contact numbers are listed  below.  The following services may also be recommended but are not provided by the Inpatient Rehabilitation Center:  Driving Evaluations Home Health Rehabiltiation Services Outpatient Rehabilitation Services Vocational Rehabilitation   Arrangements will be made to provide these services after discharge if needed.  Arrangements include referral to agencies that provide these services.  Your insurance has been verified to be:  [employer]  Your primary  doctor is:  [facility]ms  Pertinent information will be shared with your doctor and your insurance company.  Inpatient Rehabilitation Care Coordinator:  Susie Cassette [phone] or [phone]  Information discussed with and copy given to patient by: Gretchen Short, 02/10/2024, 9:33 AM

## 2024-02-10 NOTE — Progress Notes (Signed)
 Patient ID: Alison Lopez, female   DOB: [birth year], 68 y.o.   MRN: [id number]   [id number] left message for pt wife to inform on ELOS and will follow-up with updates after team conference.   Cecile Sheerer, MSW, LCSW Office: [phone] Cell: [phone] Fax: [phone]

## 2024-02-10 NOTE — Progress Notes (Signed)
 Physical Therapy Session Note  Patient Details  Name: Alison Lopez MRN: [id number] Date of Birth: August 11, 1956  Today's Date: 02/10/2024 PT Missed Time: 30 Minutes Missed Time Reason: Patient fatigue  Short Term Goals: Week 1:  PT Short Term Goal 1 (Week 1): Pt will transfer sit to stand w/ CGA consistently. PT Short Term Goal 2 (Week 1): Pt will amb 300' / LRAD and min A PT Short Term Goal 3 (Week 1): Pt will negotiate 6 steps w/ 1 rail and min A.  Skilled Therapeutic Interventions/Progress Updates:     Pt asleep upon PT arrival. PT will follow up as able.   Therapy Documentation Precautions:  Precautions Precautions: Fall Precaution/Restrictions Comments: blind. Restrictions Weight Bearing Restrictions Per Provider Order: No General: PT Amount of Missed Time (min): 30 Minutes PT Missed Treatment Reason: Patient fatigue   Therapy/Group: Individual Therapy  Beau Fanny, PT, DPT 02/10/2024, 4:45 PM

## 2024-02-10 NOTE — Progress Notes (Signed)
 PROGRESS NOTE   Subjective/Complaints: Hypotensive yesterday afternoon 87/57, resolved and normotensive this a.m.  Other vitals are stable.  Incontinent of bowel and bladder, frequent urination overnight, last bowel movement 4-1, large, black.  Sugars low this a.m., 76, eating well.  Patient with no concerns or complaints today.  States she is doing well, going to therapy gym.  Unable to obtain ROS due to patient cognitive status.    Objective:   No results found. Recent Labs    02/09/24 0544  WBC 8.3  HGB 11.0*  HCT 32.5*  PLT 76*   Recent Labs    02/09/24 0544  NA 138  K 4.3  CL 106  CO2 25  GLUCOSE 97  BUN 16  CREATININE 1.04*  CALCIUM 8.8*    Intake/Output Summary (Last 24 hours) at 02/10/2024 7829 Last data filed at 02/09/2024 1823 Gross per 24 hour  Intake 480 ml  Output --  Net 480 ml        Physical Exam: Vital Signs Blood pressure 119/76, pulse 64, temperature 98.4 F (36.9 C), resp. rate 16, height 4\' 9"  (1.448 m), weight 48.1 kg, SpO2 98%. Constitutional: No apparent distress. Appropriate appearance for age.  Sitting up in wheelchair.    HENT: Burr hole incisions intact.  Glasses donned, bilateral exotropia.  Blind bilaterally.  Cardiovascular: RRR, no murmurs/rub/gallops. No Edema. Peripheral pulses 2+  Respiratory: CTAB. No rales, rhonchi, or wheezing. On RA.  Abdomen: + bowel sounds, normoactive. No distention or tenderness.  Skin: C/D/I. No apparent lesions. MSK:      No apparent deformity.  Full active range of motion all 4 extremities  Neurologic: right-sided hemineglect, complicated by baseline blindness and dementia.  Oriented x 2 to self and place; not time with cues.  Difficulty with attention, processing, short and long-term memory.  motor strength is 4/5 in bilateral deltoid, bicep, tricep, grip, hip flexor, knee extensors, ankle dorsiflexor and plantar flexor  Sensory exam  normal sensation to light touch and  in bilateral upper and lower extremities Unable to perform finger-to-nose with either extremity or in any visual field  Right sided testing limited by right-sided hemineglect       Assessment/Plan: 1. Functional deficits which require 3+ hours per day of interdisciplinary therapy in a comprehensive inpatient rehab setting. Physiatrist is providing close team supervision and 24 hour management of active medical problems listed below. Physiatrist and rehab team continue to assess barriers to discharge/monitor patient progress toward functional and medical goals  Care Tool:  Bathing    Body parts bathed by patient: Right arm, Left arm, Chest, Abdomen, Front perineal area, Left upper leg, Right upper leg, Face   Body parts bathed by helper: Buttocks, Right lower leg, Left lower leg     Bathing assist Assist Level: Moderate Assistance - Patient 50 - 74%     Upper Body Dressing/Undressing Upper body dressing   What is the patient wearing?: Pull over shirt    Upper body assist Assist Level: Moderate Assistance - Patient 50 - 74%    Lower Body Dressing/Undressing Lower body dressing      What is the patient wearing?: Pants     Lower body  assist Assist for lower body dressing: Moderate Assistance - Patient 50 - 74%     Toileting Toileting    Toileting assist Assist for toileting: Moderate Assistance - Patient 50 - 74%     Transfers Chair/bed transfer  Transfers assist     Chair/bed transfer assist level: Minimal Assistance - Patient > 75%     Locomotion Ambulation   Ambulation assist      Assist level: Minimal Assistance - Patient > 75% Assistive device: Hand held assist (and no device.) Max distance: 200   Walk 10 feet activity   Assist     Assist level: Minimal Assistance - Patient > 75% Assistive device: Hand held assist (and no device.)   Walk 50 feet activity   Assist    Assist level: Minimal Assistance  - Patient > 75% Assistive device: Hand held assist (and no device.)    Walk 150 feet activity   Assist    Assist level: Minimal Assistance - Patient > 75% Assistive device: Hand held assist (and no device.)    Walk 10 feet on uneven surface  activity   Assist     Assist level: Minimal Assistance - Patient > 75% Assistive device: Hand held assist   Wheelchair     Assist Is the patient using a wheelchair?: Yes Type of Wheelchair: Manual    Wheelchair assist level: Moderate Assistance - Patient 50 - 74% Max wheelchair distance: 20    Wheelchair 50 feet with 2 turns activity    Assist        Assist Level: Total Assistance - Patient < 25%   Wheelchair 150 feet activity     Assist      Assist Level: Dependent - Patient 0%   Blood pressure 119/76, pulse 64, temperature 98.4 F (36.9 C), resp. rate 16, height 4\' 9"  (1.448 m), weight 48.1 kg, SpO2 98%.  Medical Problem List and Plan: 1. 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  Status post bur hole 01/30/2024.   Plan to remove staples/sutures 02/13/19/2025, patient with dense right homonymous hemianopsia.  Cognitive deficits question due to dementia versus worsening due to traumatic brain injury             -patient may not shower              -ELOS/Goals: 12 to 14 days with supervision to min assist goals cognitively and supervision goals -- DC date pending evals   - Stable to continue CIR  4/1: Aphasic, aparaxic, and dysarthric with SLP. Cull supervision for feeding. Limited by vision deficits. Recommending soft touch collar. Other evals pending.   2.  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             -antiplatelet therapy: N/A  3. Pain Management: N/A  4. Mood/Behavior/Sleep/Dementia: Zoloft 25 mg daily, Aricept 10 mg nightly Namenda 5 mg daily.              -antipsychotic agents: N/A   - delirium precautions  5. Neuropsych/cognition: This patient is not capable of making decisions on her own behalf.  6. Skin/Wound Care: Routine skin checks - suture removal 02/13/2024  7. Fluids/Electrolytes/Nutrition: Routine in and outs with follow-up chemistries   - AM labs stable  8.  Diabetes mellitus with retinopathy/legally blind.  Latest hemoglobin A1c 4.8.  SSI.  -- 4-1: Patient blind in all fields, unable to appreciate prior reported  homonomous hemianopsia today.  Continue monitoring.  9.  Hyperlipidemia.  Crestor  10.  Hypothyroidism.  Synthroid  11.  Chronic thrombocytopenia.  Patient received Decadron 40 mg IV daily 4 days.  Follow-up hematology services.  Admission platelets of 52,000.  Latest platelet count 69,000. Would hold enoxaparin if platelet count less than 40,000  - 4/1: plt up to 76; monitor  12.  B12 deficiency.  Continue supplement  13. Bloody stool/Black Bms.   - HgB stable 11   - Add protonix 40 mg bid   - FOBT   4/1: Large, black bowel movement.  FOBT pending.   14. Mixed continence   - Timed toiletting   - PVRs x3 days  4-2: Continues with frequency overnight.  Will get PVRs today, inquired with nursing  LOS: 2 days A FACE TO FACE EVALUATION WAS PERFORMED  Angelina Sheriff 02/10/2024, 8:12 AM

## 2024-02-10 NOTE — Progress Notes (Signed)
 Speech Language Pathology Daily Session Note  Patient Details  Name: Alison Lopez MRN: [id number] Date of Birth: May 10, 1956  Today's Date: 02/10/2024 SLP Individual Time: 9528-4132 SLP Individual Time Calculation (min): 60 min  Short Term Goals: Week 1: SLP Short Term Goal 1 (Week 1): Pt will complete functional naming tasks w/ modA SLP Short Term Goal 2 (Week 1): Pt will utilize compensatory speech strategies at sentence level w/ modA SLP Short Term Goal 3 (Week 1): Pt will comprehend mildly complex auditory information w/ modA SLP Short Term Goal 4 (Week 1): pt will solve problems during functional cognitive tasks w/ modA  Skilled Therapeutic Interventions:   Pt greeted at bedside. She was awake/alert upon SLP arrival and cooperative throughout tx tasks targeting cognition and language. She required only s cues to provide information about her family, including names, relationships, and hobbies. She benefited from modA cues for word finding during concrete generative naming task (3-4 items) re household items. After discussing food items, SLP assisted pt to place lunch order for today. She was able to verbally choose items from a fo 4. She then completed modified semantic feature analysis task, providing descriptions (look and function) of common objects. She benefited from [facility] during task to provide thorough responses during 7/8 responses. She continues to require additional time for responses during verbal tasks given processing delays and slow rate of speech. No apraxic distortions were noted this date, though imprecise consonant productions were noted. At the end of tx tasks, she was left in her bed with the alarm set and call light within reach. Recommend cont ST.   Pain  No pain reported  Therapy/Group: Individual Therapy  Pati Gallo 02/10/2024, 7:58 AM

## 2024-02-10 NOTE — Progress Notes (Signed)
 Inpatient Rehabilitation Care Coordinator Assessment and Plan Patient Details  Name: Alison Lopez MRN: [id number] Date of Birth: 02-Nov-1956  Today's Date: 02/10/2024  Hospital Problems: Principal Problem:   SDH (subdural hematoma) (HCC)  Past Medical History:  Past Medical History:  Diagnosis Date   Anxiety    Arthritis    Collagen vascular disease (HCC)    COPD (chronic obstructive pulmonary disease) (HCC)    DDD (degenerative disc disease), lumbar    Dementia (HCC)    Depression    Diabetes mellitus without complication (HCC)    DVT (deep venous thrombosis) (HCC)    Fibromyalgia    GERD (gastroesophageal reflux disease)    Graves' disease with exophthalmos    HLD (hyperlipidemia)    Hypertension    Hypothyroidism    Lupus    Osteoporosis    Past Surgical History:  Past Surgical History:  Procedure Laterality Date   ABDOMINAL HYSTERECTOMY     BURR HOLE Bilateral 01/30/2024   Procedure: CREATION, CRANIAL BURR HOLE, SUBDURAL HEMATOMA EVACUATIOIN;  Surgeon: Venetia Night, MD;  Location: [facility] ORS;  Service: Neurosurgery;  Laterality: Bilateral;  need ventriculostomy x 2   COLONOSCOPY WITH PROPOFOL N/A 06/11/2021   Procedure: COLONOSCOPY WITH PROPOFOL;  Surgeon: Regis Bill, MD;  Location: [facility] ENDOSCOPY;  Service: Endoscopy;  Laterality: N/A;  prefers afternoon Eliquis   COLONOSCOPY WITH PROPOFOL N/A 12/19/2022   Procedure: COLONOSCOPY WITH PROPOFOL;  Surgeon: Regis Bill, MD;  Location: [facility] ENDOSCOPY;  Service: Endoscopy;  Laterality: N/A;   ESOPHAGOGASTRODUODENOSCOPY N/A 06/11/2021   Procedure: ESOPHAGOGASTRODUODENOSCOPY (EGD);  Surgeon: Regis Bill, MD;  Location: [facility] ENDOSCOPY;  Service: Endoscopy;  Laterality: N/A;   EYE SURGERY Right    IVC FILTER REMOVAL N/A 07/21/2019   Procedure: IVC FILTER REMOVAL;  Surgeon: Annice Needy, MD;  Location: [facility] INVASIVE CV LAB;  Service: Cardiovascular;  Laterality: N/A;   LOWER EXTREMITY INTERVENTION  Right 04/22/2019   Procedure: IVC Filter Insertion with Right Lower Extremity Venous Lysis;  Surgeon: Annice Needy, MD;  Location: [facility] INVASIVE CV LAB;  Service: Cardiovascular;  Laterality: Right;   OOPHORECTOMY     Social History:  reports that she quit smoking about 23 years ago. Her smoking use included cigarettes. She started smoking about 40 years ago. She has a 17 pack-year smoking history. She has never used smokeless tobacco. She reports that she does not currently use alcohol. She reports that she does not use drugs.  Family / Support Systems Marital Status: Divorced How Long?: " a long time" Patient Roles: Parent Spouse/Significant Other: Divorced Children: Hospital doctor (dtr; lives with mother; works 5am-2:30pm/3pm); Tonye Becket (son; lives in [place]) Other Supports: a few friends to help assist when dtr is at [employer] Anticipated Caregiver: daughter Ability/Limitations of Caregiver: Pt dtr works; Medical laboratory scientific officer: 24/7 Family Dynamics: Pt has support with her dtr and grandadughter who live with her.  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, broadcasting/film/video: high school Investment banker, operational - How often do you need to have someone help you when you read instructions, pamphlets, or other written material from your doctor or pharmacy?: Never Writes: Yes Employment Status: Retired Age Retired: 64 Marine scientist Issues: Denies Guardian/Conservator: No HCPOA   Abuse/Neglect Abuse/Neglect Assessment Can Be Completed: Yes Physical Abuse: Denies Verbal Abuse: Denies Sexual Abuse: Denies Exploitation of patient/patient's resources: Denies Self-Neglect: Denies  Patient response to: Social Isolation - How often do you feel lonely or isolated from  those around you?: Never  Emotional Status Pt's affect, behavior and adjustment status: Pt in good spirits at time of visti, despite some confusion. pt dtr  assisted with assessment Recent Psychosocial Issues: Denies Psychiatric History: Pt dtr reports Zoloft and Aricept prescribed by PCP Substance Abuse History: Pt quit smoking cigarettes 15-16 years. Denies rec and etoh use  Patient / Family Perceptions, Expectations & Goals Pt/Family understanding of illness & functional limitations: Pt dtr has general understanding of pt care needs Premorbid pt/family roles/activities: Independent mobility; asst with cognition Anticipated changes in roles/activities/participation: Assistance with ADLs/IADLs Pt/family expectations/goals: Pt goal is to work on walking and getting strong  Building surveyor: None Premorbid Home Care/DME Agencies: None Transportation available at discharge: Dtr Is the patient able to respond to transportation needs?: Yes In the past 12 months, has lack of transportation kept you from medical appointments or from getting medications?: No In the past 12 months, has lack of transportation kept you from meetings, work, or from getting things needed for daily living?: No Resource referrals recommended: Neuropsychology  Discharge Planning Living Arrangements: Children, Other relatives Support Systems: Children, Other relatives, Friends/neighbors Type of Residence: Private residence Insurance Resources: Media planner (specify) Multimedia programmer Medicare) Financial Resources: Restaurant manager, fast food Screen Referred: No Living Expenses: Rent Money Management: Family Does the patient have any problems obtaining your medications?: No Home Management: Pt dtr nanaged all home care needs Patient/Family Preliminary Plans: Dtr Care Coordinator Barriers to Discharge: Decreased caregiver support, Lack of/limited family support, Insurance for SNF coverage Care Coordinator Anticipated Follow Up Needs: HH/OP  Clinical Impression SW met with pt, pt dtr and ot granddaughter in room to introduce self, explain role, inform on  d/c process,and share no d/c date at this time due to pt recently admitted and first day of evals. Pt is not a Cytogeneticist. No HCPOA. DME: rollator and shower chair.   Alison Lopez A Zamar Odwyer 02/10/2024, 9:02 AM

## 2024-02-11 ENCOUNTER — Encounter: Admitting: Neurosurgery

## 2024-02-11 DIAGNOSIS — S065XAA Traumatic subdural hemorrhage with loss of consciousness status unknown, initial encounter: Secondary | ICD-10-CM | POA: Diagnosis not present

## 2024-02-11 LAB — GLUCOSE, CAPILLARY
Glucose-Capillary: 98 mg/dL (ref 70–99)
Glucose-Capillary: 116 mg/dL — ABNORMAL HIGH (ref 70–99)
Glucose-Capillary: 112 mg/dL — ABNORMAL HIGH (ref 70–99)
Glucose-Capillary: 141 mg/dL — ABNORMAL HIGH (ref 70–99)

## 2024-02-11 MED ORDER — OXYBUTYNIN CHLORIDE 5 MG PO TABS
2.5000 mg | ORAL_TABLET | Freq: Every day | ORAL | Status: DC
  Administered 2024-02-11 – 2024-02-14 (×4): 2.5 mg via ORAL
  Filled 2024-02-11 (×4): qty 1

## 2024-02-11 NOTE — Progress Notes (Signed)
 PROGRESS NOTE   Subjective/Complaints: No acute complaints.  No events overnight.  Blood sugars are stable, lows of 99 and 98.  Extremely pleasant, no concerns today.   Unable to obtain ROS due to patient cognitive status.    Objective:   No results found. Recent Labs    02/09/24 0544  WBC 8.3  HGB 11.0*  HCT 32.5*  PLT 76*   Recent Labs    02/09/24 0544  NA 138  K 4.3  CL 106  CO2 25  GLUCOSE 97  BUN 16  CREATININE 1.04*  CALCIUM 8.8*    Intake/Output Summary (Last 24 hours) at 02/11/2024 1623 Last data filed at 02/11/2024 1300 Gross per 24 hour  Intake 278 ml  Output --  Net 278 ml        Physical Exam: Vital Signs Blood pressure (!) 125/95, pulse 78, temperature 98.9 F (37.2 C), temperature source Oral, resp. rate 17, height 4\' 9"  (1.448 m), weight 48.1 kg, SpO2 100%. Constitutional: No apparent distress. Appropriate appearance for age.  Walking backwards in therapy gym with mod assist.    HENT: Burr hole incisions intact.  Glasses donned, bilateral exotropia.  Blind bilaterally.  Cardiovascular: RRR, no murmurs/rub/gallops. No Edema. Peripheral pulses 2+  Respiratory: CTAB. No rales, rhonchi, or wheezing. On RA.  Abdomen: + bowel sounds, normoactive. No distention or tenderness.  Skin: C/D/I. No apparent lesions. MSK:      No apparent deformity.  Full active range of motion all 4 extremities  Neurologic: right-sided hemineglect, complicated by baseline blindness and dementia.  Oriented x 2 to self and place; not time with cues.  Difficulty with attention, processing, short and long-term memory.--Unchanged  motor strength is 4/5 in bilateral deltoid, bicep, tricep, grip, hip flexor, knee extensors, ankle dorsiflexor and plantar flexor  Sensory exam normal sensation to light touch and  in bilateral upper and lower extremities Unable to perform finger-to-nose with either extremity or in any visual  field  Right sided testing limited by right-sided hemineglect   Unchanged 4-3   Assessment/Plan: 1. Functional deficits which require 3+ hours per day of interdisciplinary therapy in a comprehensive inpatient rehab setting. Physiatrist is providing close team supervision and 24 hour management of active medical problems listed below. Physiatrist and rehab team continue to assess barriers to discharge/monitor patient progress toward functional and medical goals  Care Tool:  Bathing    Body parts bathed by patient: Right arm, Left arm, Chest, Abdomen, Front perineal area, Left upper leg, Right upper leg, Face   Body parts bathed by helper: Buttocks, Right lower leg, Left lower leg     Bathing assist Assist Level: Moderate Assistance - Patient 50 - 74%     Upper Body Dressing/Undressing Upper body dressing   What is the patient wearing?: Pull over shirt    Upper body assist Assist Level: Moderate Assistance - Patient 50 - 74%    Lower Body Dressing/Undressing Lower body dressing      What is the patient wearing?: Pants     Lower body assist Assist for lower body dressing: Moderate Assistance - Patient 50 - 74%     Editor, commissioning  assist Assist for toileting: Moderate Assistance - Patient 50 - 74%     Transfers Chair/bed transfer  Transfers assist     Chair/bed transfer assist level: Contact Guard/Touching assist     Locomotion Ambulation   Ambulation assist      Assist level: Contact Guard/Touching assist Assistive device: No Device Max distance: 200   Walk 10 feet activity   Assist     Assist level: Contact Guard/Touching assist Assistive device: No Device   Walk 50 feet activity   Assist    Assist level: Contact Guard/Touching assist Assistive device: No Device    Walk 150 feet activity   Assist    Assist level: Contact Guard/Touching assist Assistive device: No Device    Walk 10 feet on uneven surface   activity   Assist     Assist level: Minimal Assistance - Patient > 75% Assistive device: Hand held assist   Wheelchair     Assist Is the patient using a wheelchair?: Yes Type of Wheelchair: Manual    Wheelchair assist level: Moderate Assistance - Patient 50 - 74% Max wheelchair distance: 20    Wheelchair 50 feet with 2 turns activity    Assist        Assist Level: Total Assistance - Patient < 25%   Wheelchair 150 feet activity     Assist      Assist Level: Total Assistance - Patient < 25% (Pt. did some of it)   Blood pressure (!) 125/95, pulse 78, temperature 98.9 F (37.2 C), temperature source Oral, resp. rate 17, height 4\' 9"  (1.448 m), weight 48.1 kg, SpO2 100%.  Medical Problem List and Plan: 1. 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  Status post bur hole 01/30/2024.   Plan to remove staples/sutures 02/13/19/2025, patient with dense right homonymous hemianopsia.  Cognitive deficits question due to dementia versus worsening due to traumatic brain injury             -patient may not shower              -ELOS/Goals: 12 to 14 days with supervision to min assist goals cognitively and supervision goals -- DC date pending evals   - Stable to continue CIR  4/1: Aphasic, aparaxic, and dysarthric with SLP. Cull supervision for feeding. Limited by vision deficits. Recommending soft touch collar. Other evals pending.   2.  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             -antiplatelet therapy: N/A  3. Pain Management: N/A  4. Mood/Behavior/Sleep/Dementia: Zoloft 25 mg daily, Aricept 10 mg nightly Namenda 5 mg daily.             -antipsychotic agents: N/A   - delirium precautions  -No impulsiveness demonstrated, cognition stable.  5. Neuropsych/cognition: This patient is not capable of making decisions on her own  behalf.  6. Skin/Wound Care: Routine skin checks - suture removal 02/13/2024  7. Fluids/Electrolytes/Nutrition: Routine in and outs with follow-up chemistries   - AM labs stable  8.  Diabetes mellitus with retinopathy/legally blind.  Latest hemoglobin A1c 4.8.  SSI.  -- 4-1: Patient blind in all fields, unable to appreciate prior reported homonomous hemianopsia today.  Continue monitoring.  9.  Hyperlipidemia.  Crestor  10.  Hypothyroidism.  Synthroid  11.  Chronic thrombocytopenia.  Patient received Decadron 40 mg IV daily 4 days.  Follow-up hematology services.  Admission platelets of 52,000.  Latest platelet count 69,000. Would hold enoxaparin if platelet count less than 40,000  - 4/1: plt up to 76; monitor  12.  B12 deficiency.  Continue supplement  13. Bloody stool/Black Bms.   - HgB stable 11   - Add protonix 40 mg bid   - FOBT   4/1: Large, black bowel movement.  FOBT pending.  4-3: Reminded nursing of FOBT   14. Mixed continence   - Timed toiletting   - PVRs x3 days  4-2: Continues with frequency overnight.  Will get PVRs today, inquired with nursing  4-3: Remains with frequent nighttime incontinence.  Will try oxybutynin 2.5 mg nightly.  LOS: 3 days A FACE TO FACE EVALUATION WAS PERFORMED  Angelina Sheriff 02/11/2024, 4:23 PM

## 2024-02-11 NOTE — Progress Notes (Signed)
 Inpatient Rehabilitation  Patient information reviewed and entered into eRehab system by Feliberto Gottron, M.A., CCC-SLP, Rehab Quality Coordinator.  Information including medical coding, functional ability and quality indicators will be reviewed and updated through discharge.

## 2024-02-11 NOTE — Progress Notes (Signed)
 Occupational Therapy Session Note  Patient Details  Name: Alison Lopez MRN: [id number] Date of Birth: 1955/12/07  Today's Date: 02/11/2024 OT Individual Time: 7829-5621 OT Individual Time Calculation (min): 51 min    Short Term Goals: Week 1:  OT Short Term Goal 1 (Week 1): Pt will don shirt with min A OT Short Term Goal 2 (Week 1): Pt will don pants with min A OT Short Term Goal 3 (Week 1): Pt will complete stand pivot to the toilet with min A OT Short Term Goal 4 (Week 1): Pt will increase emergent awareness by stating she will need assist with ADLs  Skilled Therapeutic Interventions/Progress Updates:  Pt greeted supine in bed, pt agreeable to OT intervention reporting, " I want to walk"   Transfers/bed mobility/functional mobility: pt completed supine>sit with CGA. Pt completed functional ambulation > 200 ft with no AD and MINA. Pt requires MIN verbal and tactile directional cues. Worked on dual tasking and attention with pt instructed to name animals based on sequential letters. Pt completed task with intermittent MIN verbal cues to problem solve appropriate animal.   Practiced functional mobility in apt with pt able to complete transfers to TTB and recliner with MIN A mostly for directional cues. Confirmed with daughter that pt currently has a shower seat in her tub shower but did provide education on use of TTB at home. Daughter reports she will get one if they need it.   Therapeutic activity: daughter most concerned about RUE strength/coordination therefore issued pt small compliant cube with pt instructed to grasp cubes on R side and then place in cup positioned on L hand. Pt completed task with initial total A but then able to progress with supervision with MOD verbal/tactile cues. Additionally issued pt compliant block for RUE to work on grip strength and R sided attention as pt does present with R sided inattention.   Education: daughter confirms that she works from 530 AM- 2  pm and that pt sleeps most of this time but then pt gets up to her recliner and daughter leaves out snacks and drinks for pt. Did discuss potentially use of BSC at DC to assist with safety of toilet transfers. Daughter agreeable.    Ended session with pt supine in bed with all needs within reach and bed alarm activated.                    Therapy Documentation Precautions:  Precautions Precautions: Fall Precaution/Restrictions Comments: blind. Restrictions Weight Bearing Restrictions Per Provider Order: No  Pain: No pain    Therapy/Group: Individual Therapy  Barron Schmid 02/11/2024, 3:21 PM

## 2024-02-11 NOTE — IPOC Note (Addendum)
 Overall Plan of Care [facility]) Patient Details Name: Alison Lopez MRN: [id number] DOB: [birth year]  Admitting Diagnosis: SDH (subdural hematoma) [facility])  Hospital Problems: Principal Problem:   SDH (subdural hematoma) (HCC)     Functional Problem List: Nursing Bladder, Bowel, Safety, Edema, Endurance, Medication Management, Pain  PT Balance, Safety, Endurance, Motor  OT Balance, Endurance, Motor, Perception, Safety, Skin Integrity  SLP Cognition, Safety, Endurance, Sensory, Motor  TR         Basic ADL's: OT Eating, Grooming, Bathing, Dressing, Toileting     Advanced  ADL's: OT       Transfers: PT Bed Mobility, Bed to Chair, Customer service manager, Tub/Shower     Locomotion: PT Ambulation, Stairs     Additional Impairments: OT Fuctional Use of Upper Extremity  SLP Communication, Social Cognition comprehension, expression Problem Solving  TR      Anticipated Outcomes Item Anticipated Outcome  Self Feeding (S)  Swallowing      Basic self-care  (S)  Toileting  (S)   Bathroom Transfers (S)  Bowel/Bladder  manage bowel with mod I and bladder w toileting  Transfers  supervision 2/2 visual deficits.  Locomotion  CGA w/ LRAD  Communication  minA  Cognition  minA  Pain  Pain < 4 with prns  Safety/Judgment  manage safty w cues   Therapy Plan: PT Intensity: Minimum of 1-2 x/day ,45 to 90 minutes PT Frequency: 5 out of 7 days PT Duration Estimated Length of Stay: 12-14 days OT Intensity: Minimum of 1-2 x/day, 45 to 90 minutes OT Frequency: 5 out of 7 days OT Duration/Estimated Length of Stay: 12-14 days SLP Intensity: Minumum of 1-2 x/day, 30 to 90 minutes SLP Frequency: 3 to 5 out of 7 days SLP Duration/Estimated Length of Stay: TBD   Team Interventions: Nursing Interventions Bladder Management, Bowel Management, Disease Management/Prevention, Medication Management, Discharge Planning, Pain Management, Patient/Family Education  PT interventions Ambulation/gait  training, Community reintegration, Neuromuscular re-education, Stair training, UE/LE Strength taining/ROM, Warden/ranger, Discharge planning, Therapeutic Activities, UE/LE Coordination activities, Therapeutic Exercise, Patient/family education, Functional mobility training  OT Interventions    SLP Interventions Cognitive remediation/compensation, Internal/external aids, Speech/Language facilitation, Therapeutic Exercise, Cueing hierarchy, Functional tasks, Patient/family education, Therapeutic Activities  TR Interventions    SW/CM Interventions Discharge Planning, Psychosocial Support, Patient/Family Education   Barriers to Discharge MD  Medical stability, Home enviroment access/loayout, Lack of/limited family support, and Insurance for SNF coverage  Nursing Decreased caregiver support, Home environment access/layout 1 level 4 ste right rail with daughter  PT Home environment access/layout, Other (comments) blindness.  OT Decreased caregiver support unclear if 24/7 supervision is available  SLP Other (comments) baseline cognition/ability to carryover, visual deficits  SW Decreased caregiver support, Lack of/limited family support, Community education officer for SNF coverage     Team Discharge Planning: Destination: PT-Home ,OT- Home , SLP-Home Projected Follow-up: PT-Home health PT, OT-  Home health OT, SLP-Outpatient SLP, 24 hour supervision/assistance Projected Equipment Needs: PT-To be determined, OT- To be determined, SLP-To be determined Equipment Details: PT- , OT-  Patient/family involved in discharge planning: PT- Patient, Family member/caregiver,  OT-Patient, SLP-Patient  MD ELOS: 12-14 days Medical Rehab Prognosis:  Good Assessment: The patient has been admitted for CIR therapies with the diagnosis of SDH. The team will be addressing functional mobility, strength, stamina, balance, safety, adaptive techniques and equipment, self-care, bowel and bladder mgt, patient and caregiver  education,. Goals have been set at [facility] SLP, Supervision PT/OT. Anticipated discharge destination is home.  See Team Conference Notes for weekly updates to the plan of care

## 2024-02-11 NOTE — Plan of Care (Signed)
  Problem: RH BOWEL ELIMINATION Goal: RH STG MANAGE BOWEL WITH ASSISTANCE Description: STG Manage Bowel with toileting Assistance. Outcome: Progressing Goal: RH STG MANAGE BOWEL W/MEDICATION W/ASSISTANCE Description: STG Manage Bowel with Medication with mod I Assistance. Outcome: Progressing   Problem: RH BLADDER ELIMINATION Goal: RH STG MANAGE BLADDER WITH ASSISTANCE Description: STG Manage Bladder With toileting Assistance Outcome: Progressing   Problem: RH SAFETY Goal: RH STG ADHERE TO SAFETY PRECAUTIONS W/ASSISTANCE/DEVICE Description: STG Adhere to Safety Precautions With cues Assistance/Device. Outcome: Progressing   Problem: RH PAIN MANAGEMENT Goal: RH STG PAIN MANAGED AT OR BELOW PT'S PAIN GOAL Description: Pain manage < 4 with prns Outcome: Progressing

## 2024-02-11 NOTE — Progress Notes (Signed)
 Physical Therapy Session Note  Patient Details  Name: Alison Lopez MRN: [id number] Date of Birth: 07/03/1956  Today's Date: 02/11/2024 PT Individual Time: 0932-1045 PT Individual Time Calculation (min): 73 min   Short Term Goals: Week 1:  PT Short Term Goal 1 (Week 1): Pt will transfer sit to stand w/ CGA consistently. PT Short Term Goal 2 (Week 1): Pt will amb 300' / LRAD and min A PT Short Term Goal 3 (Week 1): Pt will negotiate 6 steps w/ 1 rail and min A.  Skilled Therapeutic Interventions/Progress Updates:     Pt received supine in bed and agrees to therapy. No complaint of pain. Supine to sit with bed features and cues for positioning at EOB. Pt performs stand step transfer to [facility] with cues for sequencing and positioning, with minA provided at trunk. Pt brushes teeth and washes face at sink. WC transport to gym for time management. Pt ambulates x300' with CGA/minA at trunk, with cues to increase trunk rotation and arm swing to improve balance. Seated rest break prior to ambulating additional x300' with same assistance and cues. Pt then completes x10 step ups on 3" step with RLE leading. Following rest break, pt completes x10 with LLE leading, but requires cueing with each rep due to pt attempting to lead with RLE, and having poor carryover of instructions. Following rest break, pt completes additional x10 step ups with LLE, requiring cueing ~50% of time for correct sequencing. Pt ambulates to Nustep with minA, then completes Nustep for endurance and reciprocal coordination training. Pt completes x10:00 at workload of 5 with average steps per minute ~50. PT provides cues for hand and foot placement and completing full available ROM, as well as occasional cues to attend to RUE and re-grip on handle. WC transport back to room. Pt left supine in bed with alarm intact and all needs within reach   Therapy Documentation Precautions:  Precautions Precautions: Fall Precaution/Restrictions  Comments: blind. Restrictions Weight Bearing Restrictions Per Provider Order: No    Therapy/Group: Individual Therapy  Beau Fanny, PT, DPT 02/11/2024, 4:06 PM

## 2024-02-11 NOTE — Progress Notes (Signed)
 Speech Language Pathology Daily Session Note  Patient Details  Name: Alison Lopez MRN: [id number] Date of Birth: 03-18-1956  Today's Date: 02/11/2024 SLP Individual Time: 1305-1400 SLP Individual Time Calculation (min): 55 min  Short Term Goals: Week 1: SLP Short Term Goal 1 (Week 1): Pt will complete functional naming tasks w/ modA SLP Short Term Goal 2 (Week 1): Pt will utilize compensatory speech strategies at sentence level w/ modA SLP Short Term Goal 3 (Week 1): Pt will comprehend mildly complex auditory information w/ modA SLP Short Term Goal 4 (Week 1): pt will solve problems during functional cognitive tasks w/ modA  Skilled Therapeutic Interventions: SLP conducted skilled therapy session targeting communication goals. Patient agreeable to all therapy tasks. SLP facilitated generative naming task. Patient given category and benefiting from mod assist to come up with 10 associated items. During similarities/differences task, patient benefited from mod to max assist to name differences and similarities between two alike items. Patient recalled previous word finding strategies provided at last session with max assist. Patient was left in lowered bed with call bell in reach and bed alarm set. SLP will continue to target goals per plan of care.       Pain  None endorsed   Therapy/Group: Individual Therapy  Jeannie Done, M.A., CCC-SLP  Yetta Barre 02/11/2024, 2:03 PM

## 2024-02-11 NOTE — Progress Notes (Signed)
 Physical Therapy Session Note  Patient Details  Name: Alison Lopez MRN: [id number] Date of Birth: [birth year]  Today's Date: 02/11/2024 PT Individual Time: 0833-0903 PT Individual Time Calculation (min): 30 min   Short Term Goals: Week 1:  PT Short Term Goal 1 (Week 1): Pt will transfer sit to stand w/ CGA consistently. PT Short Term Goal 2 (Week 1): Pt will amb 300' / LRAD and min A PT Short Term Goal 3 (Week 1): Pt will negotiate 6 steps w/ 1 rail and min A.  Skilled Therapeutic Interventions/Progress Updates:    Handoff from NT with pt finishing breakfast. Positioned to EOB with supervision to finish eating for better positioning. Pt stating she really didn't want anymore breakfast and was full after taking a few more sips of coffee with assist due to visual deficits. Donned tedhose total assist. Sit > stand with supervision from EOB and CGA for functional transfers throughout session and during gait trials. Focused on gait training for overall balance and endurance without device with overall CGA needed several bouts up to about 200'. Cues needed for directions and awareness to obstacles due to visual impairments - some perceptual/directional difficulties noted at times. Administered FTSS for balance assessment and fall risk assessment.   Five times Sit to Stand Test (FTSS) Method: Use a straight back chair with a solid seat that is 16-18" high. Ask participant to sit on the chair with arms folded across their chest.   Instructions: "Stand up and sit down as quickly as possible 5 times, keeping your arms folded across your chest."   Measurement: Stop timing when the participant stands the 5th time.  TIME: ___26___ (in seconds)  Times > 13.6 seconds is associated with increased disability and morbidity (Guralnik, 2000) Times > 15 seconds is predictive of recurrent falls in healthy individuals aged 26 and older (Buatois, et al., 2008) Normal performance values in community dwelling  individuals aged 8 and older (Bohannon, 2006): 60-69 years: 11.4 seconds 70-79 years: 12.6 seconds 80-89 years: 14.8 seconds  MCID: >= 2.3 seconds for Vestibular Disorders (Meretta, 2006)   Returned to room with CGA and transferred to toilet with CGA. Pt continent of urine but had a BM already before in brief but finished in toilet continently. Total assist for hygiene and changing of brief. Returned back to bed with supervision (pt climbed into bed) and all needs in reach and safety alarm on.    Therapy Documentation Precautions:  Precautions Precautions: Fall Precaution/Restrictions Comments: blind. Restrictions Weight Bearing Restrictions Per Provider Order: No    Pain:  Denies pain.    Therapy/Group: Individual Therapy  Karolee Stamps Darrol Poke, PT, DPT, CBIS  02/11/2024, 9:17 AM

## 2024-02-12 ENCOUNTER — Inpatient Hospital Stay (HOSPITAL_COMMUNITY)

## 2024-02-12 DIAGNOSIS — S065XAA Traumatic subdural hemorrhage with loss of consciousness status unknown, initial encounter: Secondary | ICD-10-CM | POA: Diagnosis not present

## 2024-02-12 LAB — COMPREHENSIVE METABOLIC PANEL WITH GFR
ALT: 20 U/L (ref 0–44)
AST: 20 U/L (ref 15–41)
Albumin: 2.6 g/dL — ABNORMAL LOW (ref 3.5–5.0)
Alkaline Phosphatase: 47 U/L (ref 38–126)
Anion gap: 7 (ref 5–15)
BUN: 16 mg/dL (ref 8–23)
CO2: 24 mmol/L (ref 22–32)
Calcium: 8.4 mg/dL — ABNORMAL LOW (ref 8.9–10.3)
Chloride: 103 mmol/L (ref 98–111)
Creatinine, Ser: 1.23 mg/dL — ABNORMAL HIGH (ref 0.44–1.00)
GFR, Estimated: 48 mL/min — ABNORMAL LOW (ref >60)
Glucose, Bld: 121 mg/dL — ABNORMAL HIGH (ref 70–99)
Potassium: 4.2 mmol/L (ref 3.5–5.1)
Sodium: 134 mmol/L — ABNORMAL LOW (ref 135–145)
Total Bilirubin: 0.5 mg/dL (ref 0.0–1.2)
Total Protein: 5.6 g/dL — ABNORMAL LOW (ref 6.5–8.1)

## 2024-02-12 LAB — URINALYSIS, ROUTINE W REFLEX MICROSCOPIC
Bilirubin Urine: NEGATIVE
Glucose, UA: NEGATIVE mg/dL
Hgb urine dipstick: NEGATIVE
Ketones, ur: NEGATIVE mg/dL
Leukocytes,Ua: NEGATIVE
Nitrite: NEGATIVE
Protein, ur: NEGATIVE mg/dL
Specific Gravity, Urine: 1.019 (ref 1.005–1.030)
pH: 6 (ref 5.0–8.0)

## 2024-02-12 LAB — CBC
HCT: 27.7 % — ABNORMAL LOW (ref 36.0–46.0)
Hemoglobin: 9.5 g/dL — ABNORMAL LOW (ref 12.0–15.0)
MCH: 31.3 pg (ref 26.0–34.0)
MCHC: 34.3 g/dL (ref 30.0–36.0)
MCV: 91.1 fL (ref 80.0–100.0)
Platelets: 50 10*3/uL — ABNORMAL LOW (ref 150–400)
RBC: 3.04 MIL/uL — ABNORMAL LOW (ref 3.87–5.11)
RDW: 13 % (ref 11.5–15.5)
WBC: 10.8 10*3/uL — ABNORMAL HIGH (ref 4.0–10.5)
nRBC: 0 % (ref 0.0–0.2)

## 2024-02-12 LAB — GLUCOSE, CAPILLARY
Glucose-Capillary: 100 mg/dL — ABNORMAL HIGH (ref 70–99)
Glucose-Capillary: 116 mg/dL — ABNORMAL HIGH (ref 70–99)
Glucose-Capillary: 167 mg/dL — ABNORMAL HIGH (ref 70–99)
Glucose-Capillary: 114 mg/dL — ABNORMAL HIGH (ref 70–99)

## 2024-02-12 LAB — OCCULT BLOOD X 1 CARD TO LAB, STOOL: Fecal Occult Bld: NEGATIVE

## 2024-02-12 LAB — LACTIC ACID, PLASMA: Lactic Acid, Venous: 0.8 mmol/L (ref 0.5–1.9)

## 2024-02-12 MED ORDER — ACETAMINOPHEN 325 MG PO TABS: 325.0000 mg | ORAL_TABLET | Freq: Four times a day (QID) | ORAL | Status: DC | PRN

## 2024-02-12 NOTE — Progress Notes (Signed)
 Physical Therapy Session Note  Patient Details  Name: Alison Lopez MRN: [id number] Date of Birth: May 14, 1956  Today's Date: 02/12/2024 PT Missed Time: 60 Minutes Missed Time Reason: Patient fatigue  Short Term Goals: Week 1:  PT Short Term Goal 1 (Week 1): Pt will transfer sit to stand w/ CGA consistently. PT Short Term Goal 2 (Week 1): Pt will amb 300' / LRAD and min A PT Short Term Goal 3 (Week 1): Pt will negotiate 6 steps w/ 1 rail and min A.  Skilled Therapeutic Interventions/Progress Updates:     Pt received supine asleep. Awakens to verbal stimuli. Pt requests to remain resting at this time due to fatigue and feeling unwell. PT will follow up as able.   Therapy Documentation Precautions:  Precautions Precautions: Fall Precaution/Restrictions Comments: blind. Restrictions Weight Bearing Restrictions Per Provider Order: No   Therapy/Group: Individual Therapy  Beau Fanny, PT, DPT 02/12/2024, 4:41 PM

## 2024-02-12 NOTE — Progress Notes (Signed)
 PROGRESS NOTE   Subjective/Complaints: No acute complaints.  No events overnight.   Blood sugars well-controlled Vitals have been stable Remains mostly incontinent of urine with low bladder scans. Last bowel movement 4-3, liquid, incontinent smear  Unable to obtain ROS due to patient cognitive status.    Objective:   No results found. No results for input(s): "WBC", "HGB", "HCT", "PLT" in the last 72 hours.  No results for input(s): "NA", "K", "CL", "CO2", "GLUCOSE", "BUN", "CREATININE", "CALCIUM" in the last 72 hours.   Intake/Output Summary (Last 24 hours) at 02/12/2024 1504 Last data filed at 02/12/2024 1200 Gross per 24 hour  Intake 378 ml  Output --  Net 378 ml        Physical Exam: Vital Signs Blood pressure 125/67, pulse 69, temperature 98.3 F (36.8 C), resp. rate 16, height 4\' 9"  (1.448 m), weight 48.1 kg, SpO2 96%. Constitutional: No apparent distress. Appropriate appearance for age.  Lying in bed  HENT: Burr hole incisions intact.  Glasses donned, bilateral exotropia.  Blind bilaterally.  Cardiovascular: RRR, mild systolic murmur. No Edema.  Respiratory: CTAB. No rales, rhonchi, or wheezing. On RA.  Abdomen: + bowel sounds, normoactive. No distention or tenderness.  Skin: C/D/I. No apparent lesions. MSK:      No apparent deformity.  Full active range of motion all 4 extremities  Neurologic: right-sided hemineglect, complicated by baseline blindness and dementia.  Oriented x 2 to self and place; not time with cues.  --Unchanged + Difficulty with attention, processing, short and long-term memory.--Unchanged + Right hemineglect-seems to be improving Strength grossly 4 out of 5 throughout.  No sensory deficits.  Unable to test for ataxia due to baseline blindness.    Assessment/Plan: 1. Functional deficits which require 3+ hours per day of interdisciplinary therapy in a comprehensive inpatient rehab  setting. Physiatrist is providing close team supervision and 24 hour management of active medical problems listed below. Physiatrist and rehab team continue to assess barriers to discharge/monitor patient progress toward functional and medical goals  Care Tool:  Bathing    Body parts bathed by patient: Right arm, Left arm, Chest, Abdomen, Front perineal area, Left upper leg, Right upper leg, Face   Body parts bathed by helper: Buttocks, Right lower leg, Left lower leg     Bathing assist Assist Level: Moderate Assistance - Patient 50 - 74%     Upper Body Dressing/Undressing Upper body dressing   What is the patient wearing?: Pull over shirt    Upper body assist Assist Level: Moderate Assistance - Patient 50 - 74%    Lower Body Dressing/Undressing Lower body dressing      What is the patient wearing?: Pants     Lower body assist Assist for lower body dressing: Moderate Assistance - Patient 50 - 74%     Toileting Toileting    Toileting assist Assist for toileting: Moderate Assistance - Patient 50 - 74%     Transfers Chair/bed transfer  Transfers assist     Chair/bed transfer assist level: Contact Guard/Touching assist     Locomotion Ambulation   Ambulation assist      Assist level: Contact Guard/Touching assist Assistive device: No Device Max  distance: 200   Walk 10 feet activity   Assist     Assist level: Contact Guard/Touching assist Assistive device: No Device   Walk 50 feet activity   Assist    Assist level: Contact Guard/Touching assist Assistive device: No Device    Walk 150 feet activity   Assist    Assist level: Contact Guard/Touching assist Assistive device: No Device    Walk 10 feet on uneven surface  activity   Assist     Assist level: Minimal Assistance - Patient > 75% Assistive device: Hand held assist   Wheelchair     Assist Is the patient using a wheelchair?: Yes Type of Wheelchair: Manual     Wheelchair assist level: Moderate Assistance - Patient 50 - 74% Max wheelchair distance: 20    Wheelchair 50 feet with 2 turns activity    Assist        Assist Level: Total Assistance - Patient < 25%   Wheelchair 150 feet activity     Assist      Assist Level: Total Assistance - Patient < 25% (Pt. did some of it)   Blood pressure 125/67, pulse 69, temperature 98.3 F (36.8 C), resp. rate 16, height 4\' 9"  (1.448 m), weight 48.1 kg, SpO2 96%.  Medical Problem List and Plan: 1. 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  Status post bur hole 01/30/2024.   Plan to remove staples/sutures 02/13/19/2025, patient with dense right homonymous hemianopsia.  Cognitive deficits question due to dementia versus worsening due to traumatic brain injury             -patient may not shower              -ELOS/Goals: 12 to 14 days with supervision to min assist goals cognitively and supervision goals -- DC date pending evals   - Stable to continue CIR  4/1: Aphasic, aparaxic, and dysarthric with SLP. Cull supervision for feeding. Limited by vision deficits. Recommending soft touch collar. Other evals pending.   2.  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             -antiplatelet therapy: N/A  3. Pain Management: N/A  4. Mood/Behavior/Sleep/Dementia: Zoloft 25 mg daily, Aricept 10 mg nightly Namenda 5 mg daily.             -antipsychotic agents: N/A   - delirium precautions  -No impulsiveness demonstrated, cognition stable.  5. Neuropsych/cognition: This patient is not capable of making decisions on her own behalf.  6. Skin/Wound Care: Routine skin checks - suture/staple removal ordered for 02/13/2024  7. Fluids/Electrolytes/Nutrition: Routine in and outs with follow-up chemistries   - AM labs stable  8.  Diabetes mellitus with  retinopathy/legally blind.  Latest hemoglobin A1c 4.8.  SSI.  -- 4-1: Patient blind in all fields, unable to appreciate prior reported homonomous hemianopsia today.  Continue monitoring.  9.  Hyperlipidemia.  Crestor  10.  Hypothyroidism.  Synthroid  11.  Chronic thrombocytopenia.  Patient received Decadron 40 mg IV daily 4 days.  Follow-up hematology services.  Admission platelets of 52,000.  Latest platelet count 69,000. Would hold enoxaparin if platelet count less than 40,000  - 4/1: plt up to 76; monitor  12.  B12 deficiency.  Continue supplement  13. Bloody stool/Black Bms.   - HgB stable 11   - Add protonix 40 mg bid   - FOBT  4/1: Large, black bowel movement.  FOBT pending.  4-3: Reminded nursing of FOBT--again reminded 4-4  4-4 last bowel movement, smear   14. Mixed continence   - Timed toiletting   - PVRs x3 days  4-2: Continues with frequency overnight.  Will get PVRs today, inquired with nursing  4-3: Remains with frequent nighttime incontinence.  Will try oxybutynin 2.5 mg nightly.  4-4: Bladder scans remain low, frequency seems to be going down.  Would not increase oxybutynin further given ongoing cognitive frailty.  LOS: 4 days A FACE TO FACE EVALUATION WAS PERFORMED  Alison Lopez 02/12/2024, 3:04 PM

## 2024-02-12 NOTE — Progress Notes (Signed)
 Met with patient to to review current situation, team conference and plan of care. Patient with confusion. Will revisit when family in room.

## 2024-02-12 NOTE — Progress Notes (Signed)
 Speech Language Pathology Daily Session Note  Patient Details  Name: Alison Lopez MRN: [id number] Date of Birth: 03-Sep-1956  Today's Date: 02/12/2024 SLP Individual Time: 5621-3086 SLP Individual Time Calculation (min): 43 min  Short Term Goals: Week 1: SLP Short Term Goal 1 (Week 1): Pt will complete functional naming tasks w/ modA SLP Short Term Goal 2 (Week 1): Pt will utilize compensatory speech strategies at sentence level w/ modA SLP Short Term Goal 3 (Week 1): Pt will comprehend mildly complex auditory information w/ modA SLP Short Term Goal 4 (Week 1): pt will solve problems during functional cognitive tasks w/ modA  Skilled Therapeutic Interventions: Skilled therapy session focused on cognitive goals. SLP facilitated session by providing verbal cues during orientation task. Patient independently oriented to self, location and year. Patient required modA to orient to situation and maxA to orient to time.  SLP continued to target problem solving goals through providing verbal cues during safety hazard task. Patient recalled why each verbalized situation was dangerous and how it could be fixed with modA. Patient left in bed with alarm set and call bell in reach. Continue POC.  Pain Denies  Therapy/Group: Individual Therapy  Maygan Koeller M.A., CCC-SLP 02/12/2024, 7:47 AM

## 2024-02-12 NOTE — Progress Notes (Signed)
 Occupational Therapy Session Note  Patient Details  Name: Alison Lopez MRN: [id number] Date of Birth: 17-Apr-1956  Session 1 Today's Date: 02/12/2024 OT Individual Time: 0102-7253 OT Individual Time Calculation (min): 85 min   Session 2 Today's Date: 02/12/2024 OT Individual Time: 1405-1430 OT Individual Time Calculation (min): 25 min    Short Term Goals: Week 1:  OT Short Term Goal 1 (Week 1): Pt will don shirt with min A OT Short Term Goal 2 (Week 1): Pt will don pants with min A OT Short Term Goal 3 (Week 1): Pt will complete stand pivot to the toilet with min A OT Short Term Goal 4 (Week 1): Pt will increase emergent awareness by stating she will need assist with ADLs  Skilled Therapeutic Interventions/Progress Updates:   Session 1  Pt received supine with no c/o pain, agreeable to OT session. She was slow to initiate this session, more apraxic and slight L UE proprioceptive deficits not appreciated on eval. This did improve as the session continued. She came to EOB with min A. Ambulatory transfer into the bathroom with min HHA. She voided urine continently on toilet and required mod A for toileting tasks overall. From the w/c she was set up to eat breakfast. She had minimal appetite and required mod HOH to spear food and eat about 5 bites of french toast. She did drink her entire cup of coffee with moderate spillage- retrieved her a weighted spout cup to reduce spillage and promote independence with liquids. She completed functional mobility into the bathroom and transferred into the shower with min HHA. She required mod cueing for initiation of bathing once in the shower. She required tactile cueing to use the RUE with R inattention impacting awareness of wash cloth in the RUE. She was able to stand with min A to wash LB. She returned to the w/c following. Max A to don shirt and pants for sequencing. She then completed 200 ft of functional mobility x3 trials to increase dynamic standing  balance and functional activity tolerance, CGA- min A as she fatigued. She returned to her room and was left supine with all needs met, bed alarm set.    Session 2 Pt received supine with no c/o pain, agreeable to OT session. She first completed functional mobility into the bathroom with min HHA. Very poor sequencing and apraxic. She completed toileting tasks overall with mod A. She voided urine continently. Extra time provided to allow pt to attempt and motor plan ripping off toilet paper with BUE- mod HOH required with very poor error recognition. She washed her hands at the sink following with min A. She completed 200 ft of functional mobility with min HHA. She ascended stairs, x4 following a seated rest break, with min HHA, for carryover to home threshold management and home access. She completed 100 ft of functional mobility back to the room with min HHA. She was left supine with all needs met, bed alarm set.    Therapy Documentation Precautions:  Precautions Precautions: Fall Precaution/Restrictions Comments: blind. Restrictions Weight Bearing Restrictions Per Provider Order: No  Therapy/Group: Individual Therapy  Crissie Reese 02/12/2024, 6:02 AM

## 2024-02-13 ENCOUNTER — Inpatient Hospital Stay (HOSPITAL_COMMUNITY)

## 2024-02-13 DIAGNOSIS — R509 Fever, unspecified: Secondary | ICD-10-CM

## 2024-02-13 DIAGNOSIS — S065XAA Traumatic subdural hemorrhage with loss of consciousness status unknown, initial encounter: Secondary | ICD-10-CM | POA: Diagnosis not present

## 2024-02-13 DIAGNOSIS — E11319 Type 2 diabetes mellitus with unspecified diabetic retinopathy without macular edema: Secondary | ICD-10-CM

## 2024-02-13 DIAGNOSIS — K921 Melena: Secondary | ICD-10-CM

## 2024-02-13 DIAGNOSIS — Z794 Long term (current) use of insulin: Secondary | ICD-10-CM

## 2024-02-13 DIAGNOSIS — D696 Thrombocytopenia, unspecified: Secondary | ICD-10-CM

## 2024-02-13 LAB — CBC
HCT: 32.4 % — ABNORMAL LOW (ref 36.0–46.0)
Hemoglobin: 10.9 g/dL — ABNORMAL LOW (ref 12.0–15.0)
MCH: 30.7 pg (ref 26.0–34.0)
MCHC: 33.6 g/dL (ref 30.0–36.0)
MCV: 91.3 fL (ref 80.0–100.0)
Platelets: 50 10*3/uL — ABNORMAL LOW (ref 150–400)
RBC: 3.55 MIL/uL — ABNORMAL LOW (ref 3.87–5.11)
RDW: 12.9 % (ref 11.5–15.5)
WBC: 9.6 10*3/uL (ref 4.0–10.5)
nRBC: 0 % (ref 0.0–0.2)

## 2024-02-13 LAB — GLUCOSE, CAPILLARY
Glucose-Capillary: 98 mg/dL (ref 70–99)
Glucose-Capillary: 147 mg/dL — ABNORMAL HIGH (ref 70–99)
Glucose-Capillary: 93 mg/dL (ref 70–99)
Glucose-Capillary: 107 mg/dL — ABNORMAL HIGH (ref 70–99)

## 2024-02-13 LAB — BASIC METABOLIC PANEL WITH GFR
Anion gap: 10 (ref 5–15)
BUN: 14 mg/dL (ref 8–23)
CO2: 24 mmol/L (ref 22–32)
Calcium: 8.9 mg/dL (ref 8.9–10.3)
Chloride: 102 mmol/L (ref 98–111)
Creatinine, Ser: 1.04 mg/dL — ABNORMAL HIGH (ref 0.44–1.00)
GFR, Estimated: 58 mL/min — ABNORMAL LOW (ref >60)
Glucose, Bld: 153 mg/dL — ABNORMAL HIGH (ref 70–99)
Potassium: 4.3 mmol/L (ref 3.5–5.1)
Sodium: 136 mmol/L (ref 135–145)

## 2024-02-13 LAB — LACTIC ACID, PLASMA: Lactic Acid, Venous: 0.8 mmol/L (ref 0.5–1.9)

## 2024-02-13 NOTE — Progress Notes (Signed)
 Physical Therapy Session Note  Patient Details  Name: Alison Lopez MRN: [id number] Date of Birth: Aug 04, 1956  Today's Date: 02/13/2024 PT Individual Time: 0817-0902 PT Individual Time Calculation (min): 45 min   Short Term Goals: Week 1:  PT Short Term Goal 1 (Week 1): Pt will transfer sit to stand w/ CGA consistently. PT Short Term Goal 2 (Week 1): Pt will amb 300' / LRAD and min A PT Short Term Goal 3 (Week 1): Pt will negotiate 6 steps w/ 1 rail and min A.  Skilled Therapeutic Interventions/Progress Updates:    Patient seen for focus on gait/balance and LE coordination/strength.  PAtient post nursing care missed 15 minutes.  Supine to sit with S and transfer to wheelchair step pivot to wheelchair with min A and verbal cues for direction.  Patient seated for assist with self feeding banana and muffin and coffee.  Able to bring cup to lips assist for centering.  PAtient ambulated 17' with RW and min A for direction to dayroom.  Patient performed standing therex at hi-lo table for UE support to include marching, heel raises and hip abduction x 10 each with multimodal cues, assist at hips for direction and balance.  Side steps to 4" step to R x 2 x 5 reps with mod cues and assist.  Patient ambulated x 300' with min A for balance, cues for posture and direction changes.  Noted anterior bias with head forward, but improved briefly with cues.  Patient on Nu Step x 3 minutes, level 1 UE/LE's.  Ambulated with walker back to room with min to mod A for positioning in walker and walker direction.  Left in wheelchair and RN aware pt needing to toilet and entering room to assist.   Therapy Documentation Precautions:  Precautions Precautions: Fall Precaution/Restrictions Comments: blind. Restrictions Weight Bearing Restrictions Per Provider Order: No  Pain: Pain Assessment Pain Scale: 0-10 Pain Score: 0-No pain     Therapy/Group: Individual Therapy  Elray Mcgregor 02/13/2024, 8:33 AM  Sheran Lawless, PT

## 2024-02-13 NOTE — Progress Notes (Signed)
 Removed staples from R/L of head per order. Patient tolerated well. Skin intact.  Tori Milks LPN

## 2024-02-13 NOTE — Progress Notes (Signed)
 Speech Language Pathology Daily Session Note  Patient Details  Name: Alison Lopez MRN: [id number] Date of Birth: 05/16/1956  Today's Date: 02/13/2024 SLP Individual Time: 1002-1101 SLP Individual Time Calculation (min): 59 min  Short Term Goals: Week 1: SLP Short Term Goal 1 (Week 1): Pt will complete functional naming tasks w/ modA SLP Short Term Goal 2 (Week 1): Pt will utilize compensatory speech strategies at sentence level w/ modA SLP Short Term Goal 3 (Week 1): Pt will comprehend mildly complex auditory information w/ modA SLP Short Term Goal 4 (Week 1): pt will solve problems during functional cognitive tasks w/ modA  Skilled Therapeutic Interventions:  Patient was seen in am to address cognitive re- training and speech intelligibility. Pt was was easily alerted upon SLP arrival and agreeable for session. Pt initially oriented to self and hospital however, not to situation and temporal concepts. Due to visual impairments SLP unable to implement external visual aids. SLP reviewed orientation concepts along with rationale for hospitalization and specific hospital. Pt warranting max cues across session to orient to temporal concepts with inconsistent success. Pt also initially disoriented to age however, after review of information pt able to recall age for remainder of session. SLP implemented 2 speech intelligibility strategies which included; over articulation and speaking loud. Pt warranting intermittent cues throughout session to use strategies. She maintained speech intelligibility ranging from 95- 100% intelligible in a distraction free environment. Overall poor awareness of limitations and current recommendations observed initially. SLP instructed and educated pt in use of call button, while also placing it within reach, training in uses of call button, and also not getting up by herself. At conclusion of session, pt able to recall use of call button, verbalize 2 uses as well as  verbalize danger of getting up by her self. She also recalled 1 of 2 speech intelligibility trained earlier in session. Pt was left upright in bed with call button within reach and bed alarm active. SLP to continue POC.   Pain Pain Assessment Pain Scale: 0-10 Pain Score: 0-No pain  Therapy/Group: Individual Therapy  Renaee Munda 02/13/2024, 10:35 AM

## 2024-02-13 NOTE — Progress Notes (Signed)
 PROGRESS NOTE   Subjective/Complaints: Patient had fever of 101.2.  Reported that she had several blankets on her and her temperature improved to 100.7 after this was removed.  Reports she feels well this morning.  Had x-ray completed, UA did not indicate infection.  No new concerns elicited this morning.  WBC was little elevated yesterday at 10.8.  Lactic acid not elevated.   Unable to obtain ROS due to patient cognitive status.    Objective:   DG CHEST PORT 1 VIEW Result Date: 02/12/2024 CLINICAL DATA:  Fever EXAM: PORTABLE CHEST 1 VIEW COMPARISON:  CT 04/17/2021 FINDINGS: Scarring or atelectasis at the left base. Vague right lung base opacity. Normal cardiac size with aortic atherosclerosis. No pneumothorax IMPRESSION: Vague right lung base opacity, question soft tissue artifact versus focus of airspace disease. Suggest two-view chest radiograph follow-up when clinically feasible Electronically Signed   By: Jasmine Pang M.D.   On: 02/12/2024 22:32   Recent Labs    02/12/24 2240 02/13/24 1112  WBC 10.8* 9.6  HGB 9.5* 10.9*  HCT 27.7* 32.4*  PLT 50* 50*    Recent Labs    02/12/24 2240 02/13/24 1112  NA 134* 136  K 4.2 4.3  CL 103 102  CO2 24 24  GLUCOSE 121* 153*  BUN 16 14  CREATININE 1.23* 1.04*  CALCIUM 8.4* 8.9     Intake/Output Summary (Last 24 hours) at 02/13/2024 1514 Last data filed at 02/13/2024 1220 Gross per 24 hour  Intake 580 ml  Output 120 ml  Net 460 ml        Physical Exam: Vital Signs Blood pressure (!) 127/54, pulse 82, temperature 99.1 F (37.3 C), resp. rate 18, height 4\' 9"  (1.448 m), weight 48.1 kg, SpO2 99%. Constitutional: No apparent distress. Appropriate appearance for age.  Lying in bed.  Appears comfortable  HENT: Burr hole incisions intact.  Glasses donned, bilateral exotropia.  Blind bilaterally.  Cardiovascular: RRR, mild systolic murmur. No Edema.  Respiratory: CTAB. No  rales, rhonchi, or wheezing. On RA.  Abdomen: + bowel sounds, normoactive. No distention or tenderness.  Skin: C/D/I. No apparent lesions. MSK:      No apparent deformity.  Full active range of motion all 4 extremities  Neurologic: right-sided hemineglect, complicated by baseline blindness and dementia.  Oriented x 2 to self and place; not time with cues.  --Unchanged + Difficulty with attention, processing, short and long-term memory.--Unchanged + Right hemineglect-seems to be improving Strength grossly 4 out of 5 throughout.  No sensory deficits.  Unable to test for ataxia due to baseline blindness. Neurological exam 4/5 appears consistent with prior    Assessment/Plan: 1. Functional deficits which require 3+ hours per day of interdisciplinary therapy in a comprehensive inpatient rehab setting. Physiatrist is providing close team supervision and 24 hour management of active medical problems listed below. Physiatrist and rehab team continue to assess barriers to discharge/monitor patient progress toward functional and medical goals  Care Tool:  Bathing    Body parts bathed by patient: Right arm, Left arm, Chest, Abdomen, Front perineal area, Left upper leg, Right upper leg, Face   Body parts bathed by helper: Buttocks, Right lower leg,  Left lower leg     Bathing assist Assist Level: Moderate Assistance - Patient 50 - 74%     Upper Body Dressing/Undressing Upper body dressing   What is the patient wearing?: Pull over shirt    Upper body assist Assist Level: Moderate Assistance - Patient 50 - 74%    Lower Body Dressing/Undressing Lower body dressing      What is the patient wearing?: Pants     Lower body assist Assist for lower body dressing: Moderate Assistance - Patient 50 - 74%     Toileting Toileting    Toileting assist Assist for toileting: Moderate Assistance - Patient 50 - 74%     Transfers Chair/bed transfer  Transfers assist     Chair/bed transfer  assist level: Contact Guard/Touching assist     Locomotion Ambulation   Ambulation assist      Assist level: Contact Guard/Touching assist Assistive device: No Device Max distance: 200   Walk 10 feet activity   Assist     Assist level: Contact Guard/Touching assist Assistive device: No Device   Walk 50 feet activity   Assist    Assist level: Contact Guard/Touching assist Assistive device: No Device    Walk 150 feet activity   Assist    Assist level: Contact Guard/Touching assist Assistive device: No Device    Walk 10 feet on uneven surface  activity   Assist     Assist level: Minimal Assistance - Patient > 75% Assistive device: Hand held assist   Wheelchair     Assist Is the patient using a wheelchair?: Yes Type of Wheelchair: Manual    Wheelchair assist level: Moderate Assistance - Patient 50 - 74% Max wheelchair distance: 20    Wheelchair 50 feet with 2 turns activity    Assist        Assist Level: Total Assistance - Patient < 25%   Wheelchair 150 feet activity     Assist      Assist Level: Total Assistance - Patient < 25% (Pt. did some of it)   Blood pressure (!) 127/54, pulse 82, temperature 99.1 F (37.3 C), resp. rate 18, height 4\' 9"  (1.448 m), weight 48.1 kg, SpO2 99%.  Medical Problem List and Plan: 1. 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  Status post bur hole 01/30/2024.   Plan to remove staples/sutures 02/13/19/2025, patient with dense right homonymous hemianopsia.  Cognitive deficits question due to dementia versus worsening due to traumatic brain injury             -patient may not shower              -ELOS/Goals: 12 to 14 days with supervision to min assist goals cognitively and supervision goals -- DC date pending evals   - Stable to continue CIR  4/1: Aphasic, aparaxic, and dysarthric with SLP. Cull supervision for feeding. Limited by vision deficits.  Recommending soft touch collar. Other evals pending.   2.  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             -antiplatelet therapy: N/A  3. Pain Management: N/A  4. Mood/Behavior/Sleep/Dementia: Zoloft 25 mg daily, Aricept 10 mg nightly Namenda 5 mg daily.             -antipsychotic agents: N/A   - delirium precautions  -No impulsiveness demonstrated, cognition stable.  5. Neuropsych/cognition: This patient is not capable  of making decisions on her own behalf.  6. Skin/Wound Care: Routine skin checks - suture/staple removal ordered for 02/13/2024  7. Fluids/Electrolytes/Nutrition: Routine in and outs with follow-up chemistries   - AM labs stable  8.  Diabetes mellitus with retinopathy/legally blind.  Latest hemoglobin A1c 4.8.  SSI.  -- 4-1: Patient blind in all fields, unable to appreciate prior reported homonomous hemianopsia today.  Continue monitoring.  CBG (last 3)  Recent Labs    02/12/24 2043 02/13/24 0649 02/13/24 1118  GLUCAP 114* 98 147*     9.  Hyperlipidemia.  Crestor  10.  Hypothyroidism.  Synthroid  11.  Chronic thrombocytopenia.  Patient received Decadron 40 mg IV daily 4 days.  Follow-up hematology services.  Admission platelets of 52,000.  Latest platelet count 69,000. Would hold enoxaparin if platelet count less than 40,000  - 4/1: plt up to 76; monitor  -4/5 continues to be low at 50, continue to monitor  12.  B12 deficiency.  Continue supplement  13. Bloody stool/Black Bms.   - HgB stable 11   - Add protonix 40 mg bid   - FOBT   4/1: Large, black bowel movement.  FOBT pending.  4-3: Reminded nursing of FOBT--again reminded 4-4  4-4 last bowel movement, smear  4/5 LBM today, hemoglobin overall higher at 10.9   14. Mixed continence   - Timed toiletting   - PVRs x3 days  4-2: Continues with frequency overnight.  Will get PVRs today, inquired  with nursing  4-3: Remains with frequent nighttime incontinence.  Will try oxybutynin 2.5 mg nightly.  4-4: Bladder scans remain low, frequency seems to be going down.  Would not increase oxybutynin further given ongoing cognitive frailty.  4-5 urinalysis did not indicate infection    15.  Fever  - 4/5 patient had fever last night, lactic acid within normal limits, WBC back down to 9.6.  She had chest x-ray with questionable soft tissue artifact versus focus of airspace disease, 2 view recommended and was ordered-pending.  LOS: 5 days A FACE TO FACE EVALUATION WAS PERFORMED  Fanny Dance 02/13/2024, 3:14 PM

## 2024-02-13 NOTE — Progress Notes (Signed)
 On call provider [address] notified of patients temperature spike. Temperature was 101.2, rechecked after removing covers from patient and was 100.7. New orders implemented.

## 2024-02-13 NOTE — Progress Notes (Signed)
 Occupational Therapy Session Note  Patient Details  Name: Alison Lopez MRN: [id number] Date of Birth: 05/09/1956  Today's Date: 02/13/2024 OT Individual Time: 0225-0330 OT Individual Time Calculation (min): 65 min    Short Term Goals: Week 1:  OT Short Term Goal 1 (Week 1): Pt will don shirt with min A OT Short Term Goal 2 (Week 1): Pt will don pants with min A OT Short Term Goal 3 (Week 1): Pt will complete stand pivot to the toilet with min A OT Short Term Goal 4 (Week 1): Pt will increase emergent awareness by stating she will need assist with ADLs  Skilled Therapeutic Interventions/Progress Updates:    Patient seen this day for skilled OT intervention. Patient in bed resting at the time of arrival. Patient had soiled her brief and was assisted with hygiene and  changing into clean clothing items. The pt was able to roll from supine in bed to side lying for changing her brief with MinA for rolling to the left and right. The pt was able to come from supine in bed to EOB with CGA. The pt was able to transfer from EOB to w/c LOF with MinA using the bed rail and arm of the w/c for the execution of a squat pivot transfer requiring heavy cues and MinA.  The pt went on to  complete a simulated task in UB dressing using a resistive band .  The pt presented with challenges in coordination and manipulation of the theraband for bring it onto her person, requiring heavy cues and ModA secondary to challenges with coordination.  The pt complete the task 4x requiring 2 rest breaks. .  The pt went on to complete towel exercise, 4 sets of 10 for shld flexion, shld rotation, horizontal abduction, and lg circles requiring constant verbal and physical prompts and 2 rest break with each exercise. The pt went on to transfer from w/c LOF to bed LOF by ambulating to the bed with CGA using the bed rails for additional balance.The call light and bedside table were placed within reach with all additional needs addressed.    Therapy Documentation Precautions:  Precautions Precautions: Fall Precaution/Restrictions Comments: blind. Restrictions Weight Bearing Restrictions Per Provider Order: No  Therapy/Group: Individual Therapy  Lavona Mound 02/13/2024, 5:10 PM

## 2024-02-14 DIAGNOSIS — S065XAA Traumatic subdural hemorrhage with loss of consciousness status unknown, initial encounter: Secondary | ICD-10-CM | POA: Diagnosis not present

## 2024-02-14 DIAGNOSIS — E11319 Type 2 diabetes mellitus with unspecified diabetic retinopathy without macular edema: Secondary | ICD-10-CM | POA: Diagnosis not present

## 2024-02-14 DIAGNOSIS — K921 Melena: Secondary | ICD-10-CM | POA: Diagnosis not present

## 2024-02-14 DIAGNOSIS — D696 Thrombocytopenia, unspecified: Secondary | ICD-10-CM | POA: Diagnosis not present

## 2024-02-14 LAB — GLUCOSE, CAPILLARY
Glucose-Capillary: 84 mg/dL (ref 70–99)
Glucose-Capillary: 105 mg/dL — ABNORMAL HIGH (ref 70–99)
Glucose-Capillary: 107 mg/dL — ABNORMAL HIGH (ref 70–99)
Glucose-Capillary: 135 mg/dL — ABNORMAL HIGH (ref 70–99)

## 2024-02-14 NOTE — Progress Notes (Signed)
 Physical Therapy Session Note  Patient Details  Name: Alison Lopez MRN: [id number] Date of Birth: Aug 11, 1956  Today's Date: 02/14/2024 PT Individual Time: 1300-1340 PT Individual Time Calculation (min): 40 min   Short Term Goals: Week 1:  PT Short Term Goal 1 (Week 1): Pt will transfer sit to stand w/ CGA consistently. PT Short Term Goal 2 (Week 1): Pt will amb 300' / LRAD and min A PT Short Term Goal 3 (Week 1): Pt will negotiate 6 steps w/ 1 rail and min A.  Skilled Therapeutic Interventions/Progress Updates:      Pt supine in bed upon arrival. Pt agreeable to therapy. Pt denies any pain.   Supine to sit with min A, verbal cues provided for technique.   Pt reports she is cold, therapist threaded pants and ted hose with total A with pt seated EOB, pt required CGA/min A for correction of posterior LOB.   Pt performed sit to stand with no AD and CGA/light min A throughout session.   Pt ambulated room to main gym, loop around main gym, and main gym to room with CGA/min A for direction and obstacle negotiation 2/2 visual deficits.   Pt ascended 4 6 inch steps with L ascending handrail and step to gait with CGA, pt required min A for correction of anterior LOB during navigation of turn. Pt descended 4 6 inch stairs with min A, pt required increased encouragement 2/2 FOF 2/2 decreased depth perception.   Pt supine in bed at end of session with all needs within reach and bed alarm on.   Therapy Documentation Precautions:  Precautions Precautions: Fall Precaution/Restrictions Comments: blind. Restrictions Weight Bearing Restrictions Per Provider Order: No  Therapy/Group: Individual Therapy  [facility] Ambrose [place], [place], DPT  02/14/2024, 1:37 PM

## 2024-02-14 NOTE — Plan of Care (Signed)
  Problem: RH BOWEL ELIMINATION Goal: RH STG MANAGE BOWEL WITH ASSISTANCE Description: STG Manage Bowel with toileting Assistance. Outcome: Progressing Goal: RH STG MANAGE BOWEL W/MEDICATION W/ASSISTANCE Description: STG Manage Bowel with Medication with mod I Assistance. Outcome: Progressing   Problem: RH BLADDER ELIMINATION Goal: RH STG MANAGE BLADDER WITH ASSISTANCE Description: STG Manage Bladder With toileting Assistance Outcome: Progressing   Problem: RH SAFETY Goal: RH STG ADHERE TO SAFETY PRECAUTIONS W/ASSISTANCE/DEVICE Description: STG Adhere to Safety Precautions With cues Assistance/Device. Outcome: Progressing

## 2024-02-14 NOTE — Progress Notes (Addendum)
 PROGRESS NOTE   Subjective/Complaints: PT indicates she did well today with therapy.  Tmax last night 99.5.  Had a bowel movement today.  No additional concerns elicited.   Unable to obtain ROS due to patient cognitive status.    Objective:   DG Chest 2 View Result Date: 02/13/2024 CLINICAL DATA:  Fever EXAM: CHEST - 2 VIEW COMPARISON:  02/12/2024 FINDINGS: No acute airspace disease. Suspect trace left pleural effusion or pleural thickening. Previously noted vague right lung base opacity is felt secondary to artifact. Normal cardiomediastinal silhouette with aortic atherosclerosis. IMPRESSION: No focal pulmonary opacity. Suspect trace left pleural effusion or pleural thickening. Electronically Signed   By: Jasmine Pang M.D.   On: 02/13/2024 17:47   DG CHEST PORT 1 VIEW Result Date: 02/12/2024 CLINICAL DATA:  Fever EXAM: PORTABLE CHEST 1 VIEW COMPARISON:  CT 04/17/2021 FINDINGS: Scarring or atelectasis at the left base. Vague right lung base opacity. Normal cardiac size with aortic atherosclerosis. No pneumothorax IMPRESSION: Vague right lung base opacity, question soft tissue artifact versus focus of airspace disease. Suggest two-view chest radiograph follow-up when clinically feasible Electronically Signed   By: Jasmine Pang M.D.   On: 02/12/2024 22:32   Recent Labs    02/12/24 2240 02/13/24 1112  WBC 10.8* 9.6  HGB 9.5* 10.9*  HCT 27.7* 32.4*  PLT 50* 50*    Recent Labs    02/12/24 2240 02/13/24 1112  NA 134* 136  K 4.2 4.3  CL 103 102  CO2 24 24  GLUCOSE 121* 153*  BUN 16 14  CREATININE 1.23* 1.04*  CALCIUM 8.4* 8.9     Intake/Output Summary (Last 24 hours) at 02/14/2024 1608 Last data filed at 02/14/2024 0909 Gross per 24 hour  Intake 500 ml  Output --  Net 500 ml        Physical Exam: Vital Signs Blood pressure 127/85, pulse 80, temperature 98.6 F (37 C), temperature source Oral, resp. rate 14, height 4'  9" (1.448 m), weight 48.1 kg, SpO2 96%. Constitutional: No apparent distress.  Coming back from working with therapy.  HENT: Burr hole incisions intact.  Glasses donned, bilateral exotropia.  Blind bilaterally.  Cardiovascular: RRR, mild systolic murmur. No Edema.  Respiratory: CTAB. No rales, rhonchi, or wheezing. On RA.  Abdomen: + bowel sounds, normoactive. No distention or tenderness.  Skin: C/D/I. No apparent lesions. MSK:      No apparent deformity.  Full active range of motion all 4 extremities  Neurologic: right-sided hemineglect, complicated by baseline blindness and dementia.  Oriented x 2 to self and place; not time with cues.  --Unchanged + Difficulty with attention, processing, short and long-term memory.--Unchanged + Right hemineglect-seems to be improving Strength grossly 4 out of 5 throughout.  No sensory deficits.  Unable to test for ataxia due to baseline blindness. Neurological exam 4/6 overall stable from prior    Assessment/Plan: 1. Functional deficits which require 3+ hours per day of interdisciplinary therapy in a comprehensive inpatient rehab setting. Physiatrist is providing close team supervision and 24 hour management of active medical problems listed below. Physiatrist and rehab team continue to assess barriers to discharge/monitor patient progress toward functional and medical goals  Care Tool:  Bathing    Body parts bathed by patient: Right arm, Left arm, Chest, Abdomen, Front perineal area, Left upper leg, Right upper leg, Face   Body parts bathed by helper: Buttocks, Right lower leg, Left lower leg     Bathing assist Assist Level: Moderate Assistance - Patient 50 - 74%     Upper Body Dressing/Undressing Upper body dressing   What is the patient wearing?: Pull over shirt    Upper body assist Assist Level: Moderate Assistance - Patient 50 - 74%    Lower Body Dressing/Undressing Lower body dressing      What is the patient wearing?:  Pants     Lower body assist Assist for lower body dressing: Moderate Assistance - Patient 50 - 74%     Toileting Toileting    Toileting assist Assist for toileting: Moderate Assistance - Patient 50 - 74%     Transfers Chair/bed transfer  Transfers assist     Chair/bed transfer assist level: Contact Guard/Touching assist     Locomotion Ambulation   Ambulation assist      Assist level: Contact Guard/Touching assist Assistive device: No Device Max distance: 200   Walk 10 feet activity   Assist     Assist level: Contact Guard/Touching assist Assistive device: No Device   Walk 50 feet activity   Assist    Assist level: Contact Guard/Touching assist Assistive device: No Device    Walk 150 feet activity   Assist    Assist level: Contact Guard/Touching assist Assistive device: No Device    Walk 10 feet on uneven surface  activity   Assist     Assist level: Minimal Assistance - Patient > 75% Assistive device: Hand held assist   Wheelchair     Assist Is the patient using a wheelchair?: Yes Type of Wheelchair: Manual    Wheelchair assist level: Moderate Assistance - Patient 50 - 74% Max wheelchair distance: 20    Wheelchair 50 feet with 2 turns activity    Assist        Assist Level: Total Assistance - Patient < 25%   Wheelchair 150 feet activity     Assist      Assist Level: Total Assistance - Patient < 25% (Pt. did some of it)   Blood pressure 127/85, pulse 80, temperature 98.6 F (37 C), temperature source Oral, resp. rate 14, height 4\' 9"  (1.448 m), weight 48.1 kg, SpO2 96%.  Medical Problem List and Plan: 1. 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  Status post bur hole 01/30/2024.   Plan to remove staples/sutures 02/13/19/2025, patient with dense right homonymous hemianopsia.  Cognitive deficits question due to dementia versus worsening due to traumatic brain injury              -patient may not shower              -ELOS/Goals: 12 to 14 days with supervision to min assist goals cognitively and supervision goals -- DC date pending evals   - Stable to continue CIR  4/1: Aphasic, aparaxic, and dysarthric with SLP. Cull supervision for feeding. Limited by vision deficits. Recommending soft touch collar. Other evals pending.   2.  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             -antiplatelet therapy: N/A  3. Pain Management: N/A  4. Mood/Behavior/Sleep/Dementia: Zoloft 25 mg daily, Aricept  10 mg nightly Namenda 5 mg daily.             -antipsychotic agents: N/A   - delirium precautions  -No impulsiveness demonstrated, cognition stable.  5. Neuropsych/cognition: This patient is not capable of making decisions on her own behalf.  6. Skin/Wound Care: Routine skin checks - suture/staple removal ordered for 02/13/2024  7. Fluids/Electrolytes/Nutrition: Routine in and outs with follow-up chemistries   - AM labs stable  8.  Diabetes mellitus with retinopathy/legally blind.  Latest hemoglobin A1c 4.8.  SSI.  -- 4-1: Patient blind in all fields, unable to appreciate prior reported homonomous hemianopsia today.  Continue monitoring.  -CBGs well controlled  CBG (last 3)  Recent Labs    02/13/24 2104 02/14/24 0631 02/14/24 1207  GLUCAP 107* 84 105*     9.  Hyperlipidemia.  Crestor  10.  Hypothyroidism.  Synthroid  11.  Chronic thrombocytopenia.  Patient received Decadron 40 mg IV daily 4 days.  Follow-up hematology services.  Admission platelets of 52,000.  Latest platelet count 69,000. Would hold enoxaparin if platelet count less than 40,000  - 4/1: plt up to 76; monitor  -PLT low at 50, Reviewed last Oncology note- "If count drops below 50,000, consider steroid treatment." Recheck count again tomorrow   -Called oncology do discuss- left message with my  number  12.  B12 deficiency.  Continue supplement  13. Bloody stool/Black Bms.   - HgB stable 11   - Add protonix 40 mg bid   - FOBT   4/1: Large, black bowel movement.  FOBT pending.  4-3: Reminded nursing of FOBT--again reminded 4-4  4-4 last bowel movement, smear  4/5 LBM today, hemoglobin overall higher at 10.9  CBC tomorrow    14. Mixed continence   - Timed toiletting   - PVRs x3 days  4-2: Continues with frequency overnight.  Will get PVRs today, inquired with nursing  4-3: Remains with frequent nighttime incontinence.  Will try oxybutynin 2.5 mg nightly.  4-4: Bladder scans remain low, frequency seems to be going down.  Would not increase oxybutynin further given ongoing cognitive frailty.  4-5 urinalysis did not indicate infection    15.  Fever  - 4/5 patient had fever last night, lactic acid within normal limits, WBC back down to 9.6.  She had chest x-ray with questionable soft tissue artifact versus focus of airspace disease, 2 view recommended and was ordered-pending.  4/6 afebrile, chest x-ray with no pulmonary opacity, possible trace pleural effusion or pleural thickening  LOS: 6 days A FACE TO FACE EVALUATION WAS PERFORMED  Fanny Dance 02/14/2024, 4:08 PM

## 2024-02-15 ENCOUNTER — Ambulatory Visit: Admitting: Oncology

## 2024-02-15 ENCOUNTER — Observation Stay (HOSPITAL_COMMUNITY)
Admission: EM | Admit: 2024-02-15 | Discharge: 2024-02-19 | Disposition: A | Discharge status: Rehabilitation Facility | Source: Ambulatory Visit | Attending: Internal Medicine | Admitting: Internal Medicine

## 2024-02-15 ENCOUNTER — Inpatient Hospital Stay (HOSPITAL_COMMUNITY)

## 2024-02-15 ENCOUNTER — Encounter (HOSPITAL_COMMUNITY): Payer: Self-pay

## 2024-02-15 DIAGNOSIS — E538 Deficiency of other specified B group vitamins: Secondary | ICD-10-CM | POA: Insufficient documentation

## 2024-02-15 DIAGNOSIS — I6200 Nontraumatic subdural hemorrhage, unspecified: Principal | ICD-10-CM | POA: Insufficient documentation

## 2024-02-15 DIAGNOSIS — F39 Unspecified mood [affective] disorder: Secondary | ICD-10-CM | POA: Insufficient documentation

## 2024-02-15 DIAGNOSIS — D649 Anemia, unspecified: Secondary | ICD-10-CM | POA: Diagnosis present

## 2024-02-15 DIAGNOSIS — G9340 Encephalopathy, unspecified: Secondary | ICD-10-CM | POA: Diagnosis present

## 2024-02-15 DIAGNOSIS — E039 Hypothyroidism, unspecified: Secondary | ICD-10-CM | POA: Diagnosis present

## 2024-02-15 DIAGNOSIS — R4789 Other speech disturbances: Secondary | ICD-10-CM | POA: Insufficient documentation

## 2024-02-15 DIAGNOSIS — Z86718 Personal history of other venous thrombosis and embolism: Secondary | ICD-10-CM | POA: Insufficient documentation

## 2024-02-15 DIAGNOSIS — F0390 Unspecified dementia without behavioral disturbance: Secondary | ICD-10-CM | POA: Insufficient documentation

## 2024-02-15 DIAGNOSIS — E785 Hyperlipidemia, unspecified: Secondary | ICD-10-CM | POA: Diagnosis present

## 2024-02-15 DIAGNOSIS — S065XAA Traumatic subdural hemorrhage with loss of consciousness status unknown, initial encounter: Principal | ICD-10-CM | POA: Diagnosis present

## 2024-02-15 DIAGNOSIS — J449 Chronic obstructive pulmonary disease, unspecified: Secondary | ICD-10-CM | POA: Insufficient documentation

## 2024-02-15 DIAGNOSIS — Z87891 Personal history of nicotine dependence: Secondary | ICD-10-CM | POA: Insufficient documentation

## 2024-02-15 DIAGNOSIS — D6861 Antiphospholipid syndrome: Secondary | ICD-10-CM | POA: Insufficient documentation

## 2024-02-15 DIAGNOSIS — E119 Type 2 diabetes mellitus without complications: Secondary | ICD-10-CM

## 2024-02-15 DIAGNOSIS — G9341 Metabolic encephalopathy: Secondary | ICD-10-CM | POA: Insufficient documentation

## 2024-02-15 DIAGNOSIS — Z79899 Other long term (current) drug therapy: Secondary | ICD-10-CM | POA: Insufficient documentation

## 2024-02-15 DIAGNOSIS — D696 Thrombocytopenia, unspecified: Secondary | ICD-10-CM | POA: Diagnosis present

## 2024-02-15 LAB — CBC WITH DIFFERENTIAL/PLATELET
Abs Immature Granulocytes: 0.04 10*3/uL (ref 0.00–0.07)
Basophils Absolute: 0.1 10*3/uL (ref 0.0–0.1)
Basophils Relative: 1 %
Eosinophils Absolute: 0.1 10*3/uL (ref 0.0–0.5)
Eosinophils Relative: 1 %
HCT: 27.4 % — ABNORMAL LOW (ref 36.0–46.0)
Hemoglobin: 9.5 g/dL — ABNORMAL LOW (ref 12.0–15.0)
Immature Granulocytes: 1 %
Lymphocytes Relative: 13 %
Lymphs Abs: 1 10*3/uL (ref 0.7–4.0)
MCH: 30.8 pg (ref 26.0–34.0)
MCHC: 34.7 g/dL (ref 30.0–36.0)
MCV: 89 fL (ref 80.0–100.0)
Monocytes Absolute: 0.8 10*3/uL (ref 0.1–1.0)
Monocytes Relative: 10 %
Neutro Abs: 5.5 10*3/uL (ref 1.7–7.7)
Neutrophils Relative %: 74 %
Platelets: 54 10*3/uL — ABNORMAL LOW (ref 150–400)
RBC: 3.08 MIL/uL — ABNORMAL LOW (ref 3.87–5.11)
RDW: 12.6 % (ref 11.5–15.5)
WBC: 7.4 10*3/uL (ref 4.0–10.5)
nRBC: 0 % (ref 0.0–0.2)

## 2024-02-15 LAB — BASIC METABOLIC PANEL WITH GFR
Anion gap: 8 (ref 5–15)
BUN: 13 mg/dL (ref 8–23)
CO2: 23 mmol/L (ref 22–32)
Calcium: 8.5 mg/dL — ABNORMAL LOW (ref 8.9–10.3)
Chloride: 104 mmol/L (ref 98–111)
Creatinine, Ser: 0.94 mg/dL (ref 0.44–1.00)
GFR, Estimated: >60 mL/min (ref >60)
Glucose, Bld: 104 mg/dL — ABNORMAL HIGH (ref 70–99)
Potassium: 4 mmol/L (ref 3.5–5.1)
Sodium: 135 mmol/L (ref 135–145)

## 2024-02-15 LAB — GLUCOSE, CAPILLARY
Glucose-Capillary: 88 mg/dL (ref 70–99)
Glucose-Capillary: 137 mg/dL — ABNORMAL HIGH (ref 70–99)
Glucose-Capillary: 101 mg/dL — ABNORMAL HIGH (ref 70–99)
Glucose-Capillary: 114 mg/dL — ABNORMAL HIGH (ref 70–99)

## 2024-02-15 MED ORDER — OXYBUTYNIN CHLORIDE 5 MG PO TABS
5.0000 mg | ORAL_TABLET | Freq: Every day | ORAL | Status: DC
  Administered 2024-02-15: 5 mg via ORAL
  Filled 2024-02-15: qty 1

## 2024-02-15 NOTE — Plan of Care (Signed)
  Problem: RH BLADDER ELIMINATION Goal: RH STG MANAGE BLADDER WITH ASSISTANCE Description: STG Manage Bladder With toileting Assistance Outcome: Progressing   Problem: RH SAFETY Goal: RH STG ADHERE TO SAFETY PRECAUTIONS W/ASSISTANCE/DEVICE Description: STG Adhere to Safety Precautions With cues Assistance/Device. Outcome: Progressing

## 2024-02-15 NOTE — Progress Notes (Signed)
 Alison Lopez   DOB:27-Mar-1956   ZO#:[id number]      ASSESSMENT & PLAN:   Thrombocytopenia APLS - low platelets 54k today - Platelets slightly worse than baseline 60s-70s. - failed coumadin in the past.  - Had been off Eliquis and recently restarted March 2025.  - Previously followed with Heme/Dr. Smith Robert. Now follows with Dr. Cathie Hoops.   Dr. Mosetta Putt will follow for this admission.   Subdural hematoma Altered mental status - Unclear if bil subdural hemorrhage spontaneous due to Eliquis or related to head injury.  If bleeding related to Eliquis, recommend lowered dose. Occurred one week after resuming Eliquis 5 mg bid.   - seen by Neurosurgery.  - PT/OT following.   Anemia - low hemoglobin 9.5 - No transfusional intervention at this time - continue to monitor CBC with diff  History of LE DVT - was on anticoagulation w/Eliquis 5 mg po bid   Code Status Full  Subjective:  Patient resting comfortably in bed. Awakens easily to verbal stimuli. However patient nonverbal, no verbal response to questions.  No acute distress is noted.   Objective:  Vitals:   02/15/24 0526 02/15/24 0900  BP: 111/71 125/65  Pulse: 68 64  Resp: 18 18  Temp: 99 F (37.2 C)   SpO2: 98%      Intake/Output Summary (Last 24 hours) at 02/15/2024 1322 Last data filed at 02/15/2024 1303 Gross per 24 hour  Intake 400 ml  Output --  Net 400 ml     REVIEW OF SYSTEMS:  Unable to assess, patient nonverbal.   PHYSICAL EXAMINATION: ECOG PERFORMANCE STATUS: 3 - Symptomatic, >50% confined to bed  Vitals:   02/15/24 0526 02/15/24 0900  BP: 111/71 125/65  Pulse: 68 64  Resp: 18 18  Temp: 99 F (37.2 C)   SpO2: 98%    Filed Weights   02/08/24 1225 02/08/24 1339  Weight: 106 lb 0.7 oz (48.1 kg) 106 lb 0.7 oz (48.1 kg)    GENERAL: alert, no distress and comfortable SKIN: +pale skin color, texture, turgor are normal, no rashes or significant lesions EYES: normal, conjunctiva are pink and non-injected, sclera  clear OROPHARYNX: no exudate, no erythema and lips, buccal mucosa, and tongue normal  NECK: supple, thyroid normal size, non-tender, without nodularity LYMPH: no palpable lymphadenopathy in the cervical, axillary or inguinal LUNGS: clear to auscultation and percussion with normal breathing effort HEART: regular rate & rhythm and no murmurs and no lower extremity edema ABDOMEN: abdomen soft, non-tender and normal bowel sounds MUSCULOSKELETAL: no cyanosis of digits and no clubbing  PSYCH: +nonverbal NEURO: no focal motor/sensory deficits   All questions were answered. The patient knows to call the clinic with any problems, questions or concerns.   The total time spent in the appointment was 40 minutes encounter with patient including review of chart and various tests results, discussions about plan of care and coordination of care plan  Alison Bills, NP 02/15/2024 1:22 PM    Labs Reviewed:  Lab Results  Component Value Date   WBC 7.4 02/15/2024   HGB 9.5 (L) 02/15/2024   HCT 27.4 (L) 02/15/2024   MCV 89.0 02/15/2024   PLT 54 (L) 02/15/2024   Recent Labs    01/29/24 1307 01/30/24 0311 02/09/24 0544 02/12/24 2240 02/13/24 1112 02/15/24 0537  NA 140   < > 138 134* 136 135  K 4.2   < > 4.3 4.2 4.3 4.0  CL 107   < > 106 103 102 104  CO2 24   < > 25 24 24 23   GLUCOSE 135*   < > 97 121* 153* 104*  BUN 14   < > 16 16 14 13   CREATININE 0.99   < > 1.04* 1.23* 1.04* 0.94  CALCIUM 9.1   < > 8.8* 8.4* 8.9 8.5*  GFRNONAA >60   < > 58* 48* 58* >60  PROT 7.1  --  5.9* 5.6*  --   --   ALBUMIN 3.7  --  2.9* 2.6*  --   --   AST 15  --  18 20  --   --   ALT 9  --  15 20  --   --   ALKPHOS 39  --  49 47  --   --   BILITOT 0.5  --  0.5 0.5  --   --    < > = values in this interval not displayed.    Studies Reviewed:  DG Chest 2 View Result Date: 02/13/2024 CLINICAL DATA:  Fever EXAM: CHEST - 2 VIEW COMPARISON:  02/12/2024 FINDINGS: No acute airspace disease. Suspect trace left pleural  effusion or pleural thickening. Previously noted vague right lung base opacity is felt secondary to artifact. Normal cardiomediastinal silhouette with aortic atherosclerosis. IMPRESSION: No focal pulmonary opacity. Suspect trace left pleural effusion or pleural thickening. Electronically Signed   By: Jasmine Pang M.D.   On: 02/13/2024 17:47   DG CHEST PORT 1 VIEW Result Date: 02/12/2024 CLINICAL DATA:  Fever EXAM: PORTABLE CHEST 1 VIEW COMPARISON:  CT 04/17/2021 FINDINGS: Scarring or atelectasis at the left base. Vague right lung base opacity. Normal cardiac size with aortic atherosclerosis. No pneumothorax IMPRESSION: Vague right lung base opacity, question soft tissue artifact versus focus of airspace disease. Suggest two-view chest radiograph follow-up when clinically feasible Electronically Signed   By: Jasmine Pang M.D.   On: 02/12/2024 22:32   CT HEAD WO CONTRAST ( ) Result Date: 01/31/2024 CLINICAL DATA:  68 year old female with altered mental status and bilateral subdural hematomas at presentation. Postoperative now bilateral subdural evacuation. EXAM: CT HEAD WITHOUT CONTRAST TECHNIQUE: Contiguous axial images were obtained from the base of the skull through the vertex without intravenous contrast. RADIATION DOSE REDUCTION: This exam was performed according to the departmental dose-optimization program which includes automated exposure control, adjustment of the mA and/or kV according to patient size and/or use of iterative reconstruction technique. COMPARISON:  Head CT 01/29/2024. FINDINGS: Brain: Postoperative changes to both subdural spaces with bilateral subdural drains in place. Associated pneumocephalus. Decreased volume of right side subdural hematoma, residual now only 4-5 mm at most levels (previously up to 14 mm). Decreased volume of left side subdural hematoma, more mixed density now and residual up to maximum thickness of 11 mm (previously 17 mm). Subsequent decreased intracranial mass  effect and improved ventricle size and configuration. Mild rightward midline shift now 1-2 mm (coronal image 35). Small volume left para falcine subdural blood and pneumocephalus also. Basilar cisterns are patent and mildly improved. No IVH or new areas of intracranial hemorrhage. Chronic parietal 0 and occipital lobe encephalomalacia more pronounced on the right. Small chronic left cerebellar infarcts. No cortically based acute infarct identified. Vascular: Calcified atherosclerosis at the skull base. No suspicious intracranial vascular hyperdensity. Skull: New bilateral vertex burr holes. Sinuses/Orbits: Visualized paranasal sinuses and mastoids are stable and well aerated. Other: Postoperative changes now to the bilateral scalp vertex with percutaneous drains and skin staples in place. Similar leftward gaze. Postoperative changes to the right  globe. IMPRESSION: 1. Postoperative substantial decrease in bilateral subdural hematomas with bilateral subdural drains in place. Residual blood up to 11 mm on the Left, 4-5 mm on the Right. 2. Significantly regressed intracranial mass effect. Residual rightward midline shift now 1-2 mm. 3. No new intracranial abnormality. Chronic ischemic disease. Electronically Signed   By: Odessa Fleming M.D.   On: 01/31/2024 05:16   CT Cervical Spine Wo Contrast Result Date: 01/29/2024 CLINICAL DATA:  Right upper and lower extremity weakness. Abrupt change. Neck trauma. EXAM: CT CERVICAL SPINE WITHOUT CONTRAST TECHNIQUE: Multidetector CT imaging of the cervical spine was performed without intravenous contrast. Multiplanar CT image reconstructions were also generated. RADIATION DOSE REDUCTION: This exam was performed according to the departmental dose-optimization program which includes automated exposure control, adjustment of the mA and/or kV according to patient size and/or use of iterative reconstruction technique. COMPARISON:  None Available. FINDINGS: Alignment: Slight degenerative  anterolisthesis is present at C4-5. No other significant listhesis is present. Straightening of the normal cervical lordosis is present. Rightward curvature of the cervical spine is centered at C6. Skull base and vertebrae: The craniocervical junction is normal. Vertebral body heights are normal. No acute fractures are present. Soft tissues and spinal canal: The soft tissues of the neck are otherwise unremarkable. Salivary glands are within normal limits. Thyroid is normal. No significant adenopathy is present. No focal mucosal or submucosal lesions are present. Disc levels: Chronic endplate degenerative changes are present at C5-6. Uncovertebral spurring leads to moderate left and mild right foraminal stenosis. 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 Upper chest: The lung apices are clear. The thoracic inlet is within normal limits. IMPRESSION: 1. No acute or focal lesion to explain the patient's symptoms. 2. Chronic endplate degenerative changes at C5-6 with moderate left and mild right foraminal stenosis. 3. 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 Electronically Signed   By: Marin Roberts M.D.   On: 01/29/2024 15:08   CT HEAD WO CONTRAST Addendum Date: 01/29/2024 ADDENDUM REPORT: 01/29/2024 13:05 ADDENDUM: Study discussed by telephone with Dr. Artis Delay on 01/29/2024 at 1259 hours. Electronically Signed   By: Odessa Fleming M.D.   On: 01/29/2024 13:05   Result Date: 01/29/2024 CLINICAL DATA:  68 year old female with abrupt onset altered mental status, incontinence, not moving right arm. History of dementia. EXAM: CT HEAD WITHOUT CONTRAST TECHNIQUE: Contiguous axial images were obtained from the base of the skull through the vertex without intravenous contrast. RADIATION DOSE REDUCTION: This exam was performed according to the departmental dose-optimization program which includes automated exposure control, adjustment of the mA and/or kV according to patient size and/or use of iterative reconstruction technique.  COMPARISON:  Head CT 02/13/2005. FINDINGS: Brain: New since 2006 bihemispheric hypodense subdural hematomas with intracranial mass effect. Both collections are fairly uniformly hypodense throughout much of the hemisphere but there is layering hyperdense blood posteriorly on both sides (series 2, image 17). Both collections are somewhat biconvex. That on the left measures up to 17 mm in maximum thickness (coronal image 42), on the right 14 mm in maximum thickness. Questionable trace para falcine hyperdense blood products also on the left coronal image 17. But no other convincing multi spatial hemorrhage. Subsequently there is no significant midline shift. Mass effect on the lateral ventricles but the basilar cisterns remain patent. Incidental choroid plexus cysts (normal variant). Chronic appearing encephalomalacia in both occipital poles (series 2, image 15), new since 2006. No cortically based acute infarct identified. And multiple small chronic appearing cerebellar infarcts more numerous on the left.  Other patchy bilateral cerebral white matter hypodensity. Vascular: Calcified atherosclerosis at the skull base. No suspicious intracranial vascular hyperdensity. Skull: Intact.  No acute osseous abnormality identified. Sinuses/Orbits: Visualized paranasal sinuses and mastoids are stable and well aerated. Other: Visualized orbits and scalp soft tissues are within normal limits. IMPRESSION: 1. Positive for bilateral mixed density Subdural Hematomas, somewhat biconvex and with 17 mm maximum thickness on the Left and 14 mm maximum thickness on the Right. Subsequent mass effect on both hemispheres although no significant midline shift. Basilar cisterns remain patent. 2. No skull fracture or other acute intracranial abnormality identified. 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 Electronically Signed: By: Odessa Fleming M.D. On: 01/29/2024 12:57

## 2024-02-15 NOTE — Progress Notes (Addendum)
 Evaluated patient who appears stable. She was able to state her name, place as hospital unable to state name but able accurate with choice of two. Able to follow simple motor commands and answer basic biographical questions. Dr Myer Haff called back after review and felt that there is a small new bleed but scans look good overall. He did not feel surgical intervention needed and recommended repeat CT in am to monitor for stability--to contact him when patient goes for films. Will reach out to hospitalist for input/monitoring on acute overnight.   Dr. Lucienne Capers cell# [phone]--please contact when patient leaves for CT head.

## 2024-02-15 NOTE — Progress Notes (Signed)
 Reached out to Dr. Myer Haff who will evaluate films for input and get back with recs.  Dr. Hunt/radiology called to report new areas of bleeding without significant change in mass effect or shift.

## 2024-02-15 NOTE — Progress Notes (Signed)
 PROGRESS NOTE   Subjective/Complaints:  Vital stable.  Tmax 99.  Eating well, incontinent of bowel and bladder, low PVR.  Last bowel movement 4-6, large, black.  BUN and creatinine now within normal range, hemoglobin remained stable, platelets remain low but stable 54.  Oncology spoke with on-call provider over the weekend, to see patient today regarding low platelets.  Patient without complaints today.  Laying in bed, states she feels well.  Later this a.m., therapies reporting some new hemineglect and overall cognitive decline.  Unable to obtain ROS due to patient cognitive status.    Objective:   DG Chest 2 View Result Date: 02/13/2024 CLINICAL DATA:  Fever EXAM: CHEST - 2 VIEW COMPARISON:  02/12/2024 FINDINGS: No acute airspace disease. Suspect trace left pleural effusion or pleural thickening. Previously noted vague right lung base opacity is felt secondary to artifact. Normal cardiomediastinal silhouette with aortic atherosclerosis. IMPRESSION: No focal pulmonary opacity. Suspect trace left pleural effusion or pleural thickening. Electronically Signed   By: Jasmine Pang M.D.   On: 02/13/2024 17:47   Recent Labs    02/13/24 1112 02/15/24 0537  WBC 9.6 7.4  HGB 10.9* 9.5*  HCT 32.4* 27.4*  PLT 50* 54*    Recent Labs    02/13/24 1112 02/15/24 0537  NA 136 135  K 4.3 4.0  CL 102 104  CO2 24 23  GLUCOSE 153* 104*  BUN 14 13  CREATININE 1.04* 0.94  CALCIUM 8.9 8.5*     Intake/Output Summary (Last 24 hours) at 02/15/2024 0802 Last data filed at 02/15/2024 0802 Gross per 24 hour  Intake 720 ml  Output --  Net 720 ml        Physical Exam: Vital Signs Blood pressure 111/71, pulse 68, temperature 99 F (37.2 C), resp. rate 18, height 4\' 9"  (1.448 m), weight 48.1 kg, SpO2 98%. Constitutional: No apparent distress.  Laying in bed. HENT: Burr hole incisions intact.  Glasses donned, bilateral exotropia.  Blind  bilaterally. Cardiovascular: RRR, mild systolic murmur. No Edema.  Respiratory: CTAB. No rales, rhonchi, or wheezing. On RA.  Abdomen: + bowel sounds, normoactive. No distention or tenderness.  Skin: C/D/I. No apparent lesions.  Peripheral IV intact. MSK:      No apparent deformity.  Full active range of motion all 4 extremities  Neurologic: right-sided hemineglect--slightly worse this a.m., complicated by baseline blindness and dementia.  Oriented x 2 to self and place --Unchanged + Difficulty with attention, processing, short and long-term memory.--Unchanged + Right hemineglect-worse this a.m. Strength grossly 4 out of 5 throughout, is moving the right upper extremity less this a.m..  No sensory deficits.  Unable to test for ataxia due to baseline blindness.    Assessment/Plan: 1. Functional deficits which require 3+ hours per day of interdisciplinary therapy in a comprehensive inpatient rehab setting. Physiatrist is providing close team supervision and 24 hour management of active medical problems listed below. Physiatrist and rehab team continue to assess barriers to discharge/monitor patient progress toward functional and medical goals  Care Tool:  Bathing    Body parts bathed by patient: Right arm, Left arm, Chest, Abdomen, Front perineal area, Left upper leg, Right upper leg, Face  Body parts bathed by helper: Buttocks, Right lower leg, Left lower leg     Bathing assist Assist Level: Moderate Assistance - Patient 50 - 74%     Upper Body Dressing/Undressing Upper body dressing   What is the patient wearing?: Pull over shirt    Upper body assist Assist Level: Moderate Assistance - Patient 50 - 74%    Lower Body Dressing/Undressing Lower body dressing      What is the patient wearing?: Pants     Lower body assist Assist for lower body dressing: Moderate Assistance - Patient 50 - 74%     Toileting Toileting    Toileting assist Assist for toileting: Moderate  Assistance - Patient 50 - 74%     Transfers Chair/bed transfer  Transfers assist     Chair/bed transfer assist level: Contact Guard/Touching assist     Locomotion Ambulation   Ambulation assist      Assist level: Contact Guard/Touching assist Assistive device: No Device Max distance: 200   Walk 10 feet activity   Assist     Assist level: Contact Guard/Touching assist Assistive device: No Device   Walk 50 feet activity   Assist    Assist level: Contact Guard/Touching assist Assistive device: No Device    Walk 150 feet activity   Assist    Assist level: Contact Guard/Touching assist Assistive device: No Device    Walk 10 feet on uneven surface  activity   Assist     Assist level: Minimal Assistance - Patient > 75% Assistive device: Hand held assist   Wheelchair     Assist Is the patient using a wheelchair?: Yes Type of Wheelchair: Manual    Wheelchair assist level: Moderate Assistance - Patient 50 - 74% Max wheelchair distance: 20    Wheelchair 50 feet with 2 turns activity    Assist        Assist Level: Total Assistance - Patient < 25%   Wheelchair 150 feet activity     Assist      Assist Level: Total Assistance - Patient < 25% (Pt. did some of it)   Blood pressure 111/71, pulse 68, temperature 99 F (37.2 C), resp. rate 18, height 4\' 9"  (1.448 m), weight 48.1 kg, SpO2 98%.  Medical Problem List and Plan: 1. 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  Status post bur hole 01/30/2024.   Plan to remove staples/sutures 02/13/19/2025, patient with dense right homonymous hemianopsia.  Cognitive deficits question due to dementia versus worsening due to traumatic brain injury             -patient may not shower              -ELOS/Goals: 12 to 14 days with supervision to min assist goals cognitively and supervision goals -- DC date pending evals   - Stable to continue CIR  4/1:  Aphasic, aparaxic, and dysarthric with SLP. Cull supervision for feeding. Limited by vision deficits. Recommending soft touch collar. Other evals pending.   4-7: Low-level temps over the weekend, along with worsening hemineglect and stable thrombocytopenia.  Will recheck CT head today to ensure SDH is stable.  2.  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  -4/7: Patient ambulating more than 300 feet with contact-guard to min assist; DC Lovenox.  Oncology discussing with neurology resumption of Eliquis at 2.5 mg twice daily; getting CT head today as above               -  antiplatelet therapy: N/A  3. Pain Management: N/A  4. Mood/Behavior/Sleep/Dementia: Zoloft 25 mg daily, Aricept 10 mg nightly Namenda 5 mg daily.             -antipsychotic agents: N/A   - delirium precautions  -No impulsiveness demonstrated, cognition stable.  5. Neuropsych/cognition: This patient is not capable of making decisions on her own behalf.  6. Skin/Wound Care: Routine skin checks - suture/staple removal ordered for 02/13/2024  7. Fluids/Electrolytes/Nutrition: Routine in and outs with follow-up chemistries   - AM labs stable  8.  Diabetes mellitus with retinopathy/legally blind.  Latest hemoglobin A1c 4.8.  SSI.  -- 4-1: Patient blind in all fields, unable to appreciate prior reported homonomous hemianopsia today.  Continue monitoring.  -CBGs well controlled  CBG (last 3)  Recent Labs    02/14/24 1607 02/14/24 2127 02/15/24 0601  GLUCAP 107* 135* 88     9.  Hyperlipidemia.  Crestor  10.  Hypothyroidism.  Synthroid  11.  Chronic thrombocytopenia.  Patient received Decadron 40 mg IV daily 4 days.  Follow-up hematology services.  Admission platelets of 52,000.  Latest platelet count 69,000. Would hold enoxaparin if platelet count less than 40,000  - 4/1: plt up to 76; monitor  -PLT low at 50, Reviewed  last Oncology note- "If count drops below 50,000, consider steroid treatment." Recheck count again tomorrow   -Called oncology do discuss- left message with my number  -4/7: Platelets remain low, 54.  No overt signs of bleeding.  DC Lovenox as above.  Oncology coming to evaluate today.  12.  B12 deficiency.  Continue supplement  13. Bloody stool/Black Bms.   - HgB stable 11   - Add protonix 40 mg bid   - FOBT   4/1: Large, black bowel movement.  FOBT pending.  4-3: Reminded nursing of FOBT--again reminded 4-4  4-4 last bowel movement, smear  4/5 LBM today, hemoglobin overall higher at 10.9  4-7: Hemoglobin stable.  FOBT negative -4-4.   14. Mixed continence   - Timed toiletting   - PVRs x3 days  4-2: Continues with frequency overnight.  Will get PVRs today, inquired with nursing  4-3: Remains with frequent nighttime incontinence.  Will try oxybutynin 2.5 mg nightly.  4-4: Bladder scans remain low, frequency seems to be going down.  Would not increase oxybutynin further given ongoing cognitive frailty.  4-5 urinalysis did not indicate infection  4/7: PVRs remain low, no appreciable improvement, increase Ditropan to 5 mg nightly.  May need to reduce or DC if confusion remains persistent and otherwise workup is negative.    15.  Fever  - 4/5 patient had fever last night, lactic acid within normal limits, WBC back down to 9.6.  She had chest x-ray with questionable soft tissue artifact versus focus of airspace disease, 2 view recommended and was ordered-pending.  4/6 afebrile, chest x-ray with no pulmonary opacity, possible trace pleural effusion or pleural thickening  4-7: Afebrile, no signs or symptoms of infection, WBC stable.  Checking CT head as above.  LOS: 7 days A FACE TO FACE EVALUATION WAS PERFORMED  Angelina Sheriff 02/15/2024, 8:02 AM

## 2024-02-15 NOTE — Progress Notes (Signed)
 Gave report to Maralyn Sago and pt is stable

## 2024-02-15 NOTE — Progress Notes (Signed)
 Physical Therapy Session Note  Patient Details  Name: Alison Lopez MRN: [id number] Date of Birth: 05-Mar-1956  Today's Date: 02/15/2024 PT Individual Time: 1302-1415 PT Individual Time Calculation (min): 73 min   Short Term Goals: Week 1:  PT Short Term Goal 1 (Week 1): Pt will transfer sit to stand w/ CGA consistently. PT Short Term Goal 2 (Week 1): Pt will amb 300' / LRAD and min A PT Short Term Goal 3 (Week 1): Pt will negotiate 6 steps w/ 1 rail and min A.  Skilled Therapeutic Interventions/Progress Updates:     Pt received supine in bed asleep. Slowly awakens to verbal and tactile stimuli. No complaint of pain but does verbalize significant fatigue. Supine to sit with minA and cues for positioning and sequencing. Pt completes sit to stand with minA and cues for initiation. Pt asked if she needs to use restroom and pt says "no", but PT notes odor indicating soiled brief. Pt ambulates to toilet with modA for navigation of crowded environment, with cues for safety and hand position. PT assists to doff brief and pt noted ot have been incontinent of urine. Pt transfers to toilet and able to void additional urine as well as having dark green/black  bowel movement. PT provides minA/modA in standing as he assists pt with peri care. Pt ambulates to sink with moda and cues for navigation. Pt washes hands at sink with cues to remain on task as pt periodically stops to lean on sink. WC transport to gym. Pt ambulates x175' with minA/modA at trunk, with cues for posture as well as sequencing for safe transition back to WC. Following rest break, pt ambulates x450' with similar assistance and cues, but requiring up to maxA multiple times at end of bout due to LOBs anteriorly. WC transport back to room. Left supine with alarm intact and all needs within reach.   Therapy Documentation Precautions:  Precautions Precautions: Fall Precaution/Restrictions Comments: blind. Restrictions Weight Bearing  Restrictions Per Provider Order: No   Therapy/Group: Individual Therapy  Beau Fanny, PT, DPT 02/15/2024, 4:23 PM

## 2024-02-15 NOTE — Progress Notes (Signed)
 Speech Language Pathology Daily Session Note  Patient Details  Name: Alison Lopez MRN: [id number] Date of Birth: 11-02-1956  Today's Date: 02/15/2024 SLP Individual Time: 1000-1100 SLP Individual Time Calculation (min): 60 min  Short Term Goals: Week 1: SLP Short Term Goal 1 (Week 1): Pt will complete functional naming tasks w/ modA SLP Short Term Goal 2 (Week 1): Pt will utilize compensatory speech strategies at sentence level w/ modA SLP Short Term Goal 3 (Week 1): Pt will comprehend mildly complex auditory information w/ modA SLP Short Term Goal 4 (Week 1): pt will solve problems during functional cognitive tasks w/ modA  Skilled Therapeutic Interventions:   Pt greeted in her room, asleep upon SLP arrival. She woke easily with verbal cues and was cooperative throughout tx tasks targeting language, cognition, and motor speech productions. She was only oriented to the year spontaneously, though able to correctly ID month and dow given 2 choices to assist w/ word finding. She benefited from mod-maxA for word finding during mildly complex responsive naming task re animals. During responses, she required intermittent phonemic cues to assist w/ speech intelligibility at word and phrase level. She also benefited from rest breaks and repetitions of cues/instructions d/t fatigue and reduced attention to task. Vitals checked given reduced attention/alertness, however, WFL at 125/65. No change in facial asymmetry from eval. Nursing notified of reduced success this tx session. DO already aware. She was left in bed with the alarm set and call light within reach. Recommend cont ST per POC.   Pain  No pain reported  Therapy/Group: Individual Therapy  Pati Gallo 02/15/2024, 10:06 AM

## 2024-02-15 NOTE — Progress Notes (Signed)
 Occupational Therapy Session Note  Patient Details  Name: Alison Lopez MRN: [id number] Date of Birth: 26-Jan-1956  Today's Date: 02/15/2024 OT Individual Time: 4098-1191 OT Individual Time Calculation (min): 74 min    Short Term Goals: Week 1:  OT Short Term Goal 1 (Week 1): Pt will don shirt with min A OT Short Term Goal 2 (Week 1): Pt will don pants with min A OT Short Term Goal 3 (Week 1): Pt will complete stand pivot to the toilet with min A OT Short Term Goal 4 (Week 1): Pt will increase emergent awareness by stating she will need assist with ADLs  Skilled Therapeutic Interventions/Progress Updates:    Pt received supine with no c/o pain, agreeable to OT session.  She had a more flat, subdued affect today. She required min A to come to EOB. She completed functional mobility into the bathroom with min HHA. She required mod A and mod cueing for sequencing transfer to the toilet, removing clothing, and for hygiene. She demonstrated poor proprioception of the LUE and RUE throughout hygiene. She voided large black BM, loose. She washed her hands at the sink with max step by step cueing, poor awareness that her hands were not actually in the water. She was able to identify ideational motor planning, stating the steps with cueing. She sat in the w/c and donned pants with mod A sit <> stand. She completed 200 ft of functional mobility with CGA- min A HHA, to the ADL apt. One rest break required at 150 ft. She completed functional reaching and RUE integration into familiar ADL/IADL tasks in the ADL apt. She demonstrated MAX cueing for apraxia. She was asked to find the refrigerator and was unable to identify error when reaching to wire shelf. She completed mass practice open/close of the fridge with the RUE to promote increase R attention/awareness. She then completed functional reaching into the cabinet to work at midline and R Associate Professor. She was able to correctly identify flavors of 3 jellos by  color. Tested stereognosis following and pt unable to correctly identify pen or fork with R/L hand. She completed 200 ft of functional mobility with min HHA back to her room. She transferred back to supine. Pt more fatigued overall this session. BP assessed following at supine- 111/64. Pt left supine with all needs met, bed alarm set.   Therapy Documentation Precautions:  Precautions Precautions: Fall Precaution/Restrictions Comments: blind. Restrictions Weight Bearing Restrictions Per Provider Order: No  Therapy/Group: Individual Therapy  Crissie Reese 02/15/2024, 6:21 AM

## 2024-02-16 ENCOUNTER — Encounter (HOSPITAL_COMMUNITY): Payer: Self-pay

## 2024-02-16 ENCOUNTER — Inpatient Hospital Stay (HOSPITAL_COMMUNITY)

## 2024-02-16 ENCOUNTER — Other Ambulatory Visit: Payer: Self-pay

## 2024-02-16 DIAGNOSIS — Z86718 Personal history of other venous thrombosis and embolism: Secondary | ICD-10-CM

## 2024-02-16 DIAGNOSIS — S065XAA Traumatic subdural hemorrhage with loss of consciousness status unknown, initial encounter: Secondary | ICD-10-CM | POA: Diagnosis not present

## 2024-02-16 DIAGNOSIS — E039 Hypothyroidism, unspecified: Secondary | ICD-10-CM | POA: Diagnosis not present

## 2024-02-16 DIAGNOSIS — D696 Thrombocytopenia, unspecified: Secondary | ICD-10-CM | POA: Diagnosis not present

## 2024-02-16 DIAGNOSIS — D649 Anemia, unspecified: Secondary | ICD-10-CM | POA: Diagnosis present

## 2024-02-16 DIAGNOSIS — I6200 Nontraumatic subdural hemorrhage, unspecified: Secondary | ICD-10-CM | POA: Diagnosis not present

## 2024-02-16 DIAGNOSIS — F0390 Unspecified dementia without behavioral disturbance: Secondary | ICD-10-CM

## 2024-02-16 DIAGNOSIS — G9340 Encephalopathy, unspecified: Secondary | ICD-10-CM | POA: Diagnosis present

## 2024-02-16 DIAGNOSIS — D6861 Antiphospholipid syndrome: Secondary | ICD-10-CM | POA: Diagnosis not present

## 2024-02-16 DIAGNOSIS — E782 Mixed hyperlipidemia: Secondary | ICD-10-CM

## 2024-02-16 DIAGNOSIS — E119 Type 2 diabetes mellitus without complications: Secondary | ICD-10-CM

## 2024-02-16 LAB — TSH: TSH: 0.465 u[IU]/mL (ref 0.350–4.500)

## 2024-02-16 LAB — PROTIME-INR
INR: 1.1 (ref 0.8–1.2)
Prothrombin Time: 14.8 s (ref 11.4–15.2)

## 2024-02-16 LAB — COMPREHENSIVE METABOLIC PANEL WITH GFR
ALT: 25 U/L (ref 0–44)
AST: 19 U/L (ref 15–41)
Albumin: 2.5 g/dL — ABNORMAL LOW (ref 3.5–5.0)
Alkaline Phosphatase: 59 U/L (ref 38–126)
Anion gap: 10 (ref 5–15)
BUN: 14 mg/dL (ref 8–23)
CO2: 21 mmol/L — ABNORMAL LOW (ref 22–32)
Calcium: 8.6 mg/dL — ABNORMAL LOW (ref 8.9–10.3)
Chloride: 103 mmol/L (ref 98–111)
Creatinine, Ser: 0.96 mg/dL (ref 0.44–1.00)
GFR, Estimated: >60 mL/min (ref >60)
Glucose, Bld: 106 mg/dL — ABNORMAL HIGH (ref 70–99)
Potassium: 4.2 mmol/L (ref 3.5–5.1)
Sodium: 134 mmol/L — ABNORMAL LOW (ref 135–145)
Total Bilirubin: 0.3 mg/dL (ref 0.0–1.2)
Total Protein: 5.7 g/dL — ABNORMAL LOW (ref 6.5–8.1)

## 2024-02-16 LAB — MAGNESIUM: Magnesium: 2 mg/dL (ref 1.7–2.4)

## 2024-02-16 LAB — GLUCOSE, CAPILLARY
Glucose-Capillary: 97 mg/dL (ref 70–99)
Glucose-Capillary: 92 mg/dL (ref 70–99)
Glucose-Capillary: 91 mg/dL (ref 70–99)
Glucose-Capillary: 116 mg/dL — ABNORMAL HIGH (ref 70–99)

## 2024-02-16 LAB — CBC WITH DIFFERENTIAL/PLATELET
Abs Immature Granulocytes: 0.03 10*3/uL (ref 0.00–0.07)
Basophils Absolute: 0.1 10*3/uL (ref 0.0–0.1)
Basophils Relative: 1 %
Eosinophils Absolute: 0.1 10*3/uL (ref 0.0–0.5)
Eosinophils Relative: 1 %
HCT: 28.4 % — ABNORMAL LOW (ref 36.0–46.0)
Hemoglobin: 9.7 g/dL — ABNORMAL LOW (ref 12.0–15.0)
Immature Granulocytes: 0 %
Lymphocytes Relative: 18 %
Lymphs Abs: 1.3 10*3/uL (ref 0.7–4.0)
MCH: 30.8 pg (ref 26.0–34.0)
MCHC: 34.2 g/dL (ref 30.0–36.0)
MCV: 90.2 fL (ref 80.0–100.0)
Monocytes Absolute: 0.8 10*3/uL (ref 0.1–1.0)
Monocytes Relative: 12 %
Neutro Abs: 4.8 10*3/uL (ref 1.7–7.7)
Neutrophils Relative %: 68 %
Platelets: 63 10*3/uL — ABNORMAL LOW (ref 150–400)
RBC: 3.15 MIL/uL — ABNORMAL LOW (ref 3.87–5.11)
RDW: 12.7 % (ref 11.5–15.5)
WBC: 7.2 10*3/uL (ref 4.0–10.5)
nRBC: 0 % (ref 0.0–0.2)

## 2024-02-16 LAB — TYPE AND SCREEN
ABO/RH(D): O POS
Antibody Screen: NEGATIVE

## 2024-02-16 LAB — VITAMIN B12: Vitamin B-12: 323 pg/mL (ref 180–914)

## 2024-02-16 LAB — APTT: aPTT: 42 s — ABNORMAL HIGH (ref 24–36)

## 2024-02-16 LAB — CK: Total CK: 17 U/L — ABNORMAL LOW (ref 38–234)

## 2024-02-16 MED ORDER — INSULIN ASPART 100 UNIT/ML IJ SOLN
0.0000 [IU] | Freq: Three times a day (TID) | INTRAMUSCULAR | Status: DC
  Administered 2024-02-17: 1 [IU] via SUBCUTANEOUS

## 2024-02-16 MED ORDER — LEVOTHYROXINE SODIUM 25 MCG PO TABS
125.0000 ug | ORAL_TABLET | Freq: Every day | ORAL | Status: DC
  Administered 2024-02-16 – 2024-02-19 (×4): 125 ug via ORAL
  Filled 2024-02-16 (×4): qty 1

## 2024-02-16 MED ORDER — ACETAMINOPHEN 650 MG RE SUPP: 650.0000 mg | Freq: Four times a day (QID) | RECTAL | Status: DC | PRN

## 2024-02-16 MED ORDER — ONDANSETRON HCL 4 MG/2ML IJ SOLN: 4.0000 mg | Freq: Four times a day (QID) | INTRAMUSCULAR | Status: DC | PRN

## 2024-02-16 MED ORDER — HYDRALAZINE HCL 20 MG/ML IJ SOLN: 10.0000 mg | INTRAMUSCULAR | Status: DC | PRN

## 2024-02-16 MED ORDER — ROSUVASTATIN CALCIUM 20 MG PO TABS
20.0000 mg | ORAL_TABLET | Freq: Every day | ORAL | Status: DC
  Administered 2024-02-16 – 2024-02-19 (×4): 20 mg via ORAL
  Filled 2024-02-16 (×4): qty 1

## 2024-02-16 MED ORDER — ACETAMINOPHEN 325 MG PO TABS: 650.0000 mg | ORAL_TABLET | Freq: Four times a day (QID) | ORAL | Status: DC | PRN

## 2024-02-16 MED ORDER — MELATONIN 3 MG PO TABS
3.0000 mg | ORAL_TABLET | Freq: Every evening | ORAL | Status: DC | PRN
  Administered 2024-02-17: 3 mg via ORAL
  Filled 2024-02-16: qty 1

## 2024-02-16 NOTE — Progress Notes (Signed)
 Inpatient Rehab Admissions Coordinator:   Note pt transferred back to acute for acute SDHs and monitoring.  Will need PT/OT/SLP evaluations and insurance approval for patient to return to CIR if/when ready.   Estill Dooms, PT, DPT Admissions Coordinator [phone] 02/16/24  1:12 PM

## 2024-02-16 NOTE — H&P (Signed)
 History and Physical      Alison Lopez WJX:[id number] DOB: 03-21-1956 DOA: 02/15/2024; DOS: 02/16/2024  PCP: Rayetta Humphrey, MD  Patient coming from: Inpatient rehab at [facility]  I have personally briefly reviewed patient's old medical records in [facility]  Chief Complaint: New subdural hematomas  HPI: Alison Lopez is a 68 y.o. female with medical history significant for chronic thrombocytopenia with baseline platelet count 50-80, type 2 diabetes mellitus, hyperlipidemia, acquired hypothyroidism, recent hospitalization for subdural hematoma status post bur hole, who is admitted to [facility] on 02/15/2024 by way of inpatient rehab center at [facility] with new subdural hematomas.   The following history is provided via my discussions with Alison Reining, PA, at the inpatient rehab center, in addition to chart review.  The patient was originally admitted to [facility] on 01/29/2024 with subdural hematoma, and subsequently underwent bur hole on 01/30/2024.  She was subsequently discharged to inpatient rehab at [facility] on 02/08/2024.  Starting on the morning of 02/15/2024, she was noted to be more confused, somnolent relative to her orientation and activity level over the preceding days.  This prompted updated CT head with the following  radiology read, in comparison to most recent prior CT head from 01/31/2024:  Slightly increased mixed attenuation subdural collection over the left cerebral convexity measuring up to 13 mm relative to prior 11 mm.  There is a new focus of blood products over the left parietal lobe measuring up to 5 mm in thickness.  There is an additional new focus of acute blood products over the posterior left occipital lobe measuring up to 9 mm in thickness.  A septated CT head also shows stable mass effect, 2 mm rightward shift, without significant change relative to most recent prior CT head.   Inpatient rehab PA, Alison Lopez, discussed CT head findings from 4/7 with  on-call neurosurgery, Dr. Marcell Barlow, who felt that the new subdural hematomas were small, did not warrant surgical intervention, and recommended supportive care, medical management, along with repeat CT head on the AM of 4/8. Dr. Marcell Barlow to continue to follow.   Additional laboratory workup and response to the patient's altered mental status included updated BMP which was notable for sodium 135, bicarb 23, creatinine 0.94 compared to most recent prior value of 1.04 on 02/13/2024, glucose 104.  Additionally, CBC on AM of 4/7 was notable for 7400, hemoglobin 9.5 compared to 10.9 on 02/13/2024, platelet count 54, trending up slightly from most recent prior value of 50 on 02/13/2024.  It is reported that the patient was not on any blood thinners, including no pharmacologic DVT prophylaxis while at the rehab center.  Not on any antiplatelet medications, including no aspirin.  Alison Lopez conveys that the inpatient rehab center does not have the ability to engage in monitored beds overnight, and does not have the resources available for you for our neurocheck monitoring.  Additional recent medical history includes subacute anemia, with hemoglobin noted to be in the range of 9.5-11.5 since 01/30/2024.  She is reported to have chronic right hemineglect, but no acute focal neurologic deficits over the last day since noting altered mental status.    Review of Systems: As per HPI otherwise 10 point review of systems negative.   Past Medical History:  Diagnosis Date   Anxiety    Arthritis    Collagen vascular disease (HCC)    COPD (chronic obstructive pulmonary disease) (HCC)    DDD (degenerative disc disease), lumbar  Dementia (HCC)    Depression    Diabetes mellitus without complication (HCC)    DVT (deep venous thrombosis) (HCC)    Fibromyalgia    GERD (gastroesophageal reflux disease)    Graves' disease with exophthalmos    HLD (hyperlipidemia)    Hypertension    Hypothyroidism    Lupus     Osteoporosis     Past Surgical History:  Procedure Laterality Date   ABDOMINAL HYSTERECTOMY     BURR HOLE Bilateral 01/30/2024   Procedure: CREATION, CRANIAL BURR HOLE, SUBDURAL HEMATOMA EVACUATIOIN;  Surgeon: Venetia Night, MD;  Location: [facility] ORS;  Service: Neurosurgery;  Laterality: Bilateral;  need ventriculostomy x 2   COLONOSCOPY WITH PROPOFOL N/A 06/11/2021   Procedure: COLONOSCOPY WITH PROPOFOL;  Surgeon: Regis Bill, MD;  Location: [facility] ENDOSCOPY;  Service: Endoscopy;  Laterality: N/A;  prefers afternoon Eliquis   COLONOSCOPY WITH PROPOFOL N/A 12/19/2022   Procedure: COLONOSCOPY WITH PROPOFOL;  Surgeon: Regis Bill, MD;  Location: [facility] ENDOSCOPY;  Service: Endoscopy;  Laterality: N/A;   ESOPHAGOGASTRODUODENOSCOPY N/A 06/11/2021   Procedure: ESOPHAGOGASTRODUODENOSCOPY (EGD);  Surgeon: Regis Bill, MD;  Location: [facility] ENDOSCOPY;  Service: Endoscopy;  Laterality: N/A;   EYE SURGERY Right    IVC FILTER REMOVAL N/A 07/21/2019   Procedure: IVC FILTER REMOVAL;  Surgeon: Annice Needy, MD;  Location: [facility] INVASIVE CV LAB;  Service: Cardiovascular;  Laterality: N/A;   LOWER EXTREMITY INTERVENTION Right 04/22/2019   Procedure: IVC Filter Insertion with Right Lower Extremity Venous Lysis;  Surgeon: Annice Needy, MD;  Location: [facility] INVASIVE CV LAB;  Service: Cardiovascular;  Laterality: Right;   OOPHORECTOMY      Social History:  reports that she quit smoking about 23 years ago. Her smoking use included cigarettes. She started smoking about 40 years ago. She has a 17 pack-year smoking history. She has never used smokeless tobacco. She reports that she does not currently use alcohol. She reports that she does not use drugs.   Allergies  Allergen Reactions   Morphine And Codeine Anaphylaxis   Montelukast     Other reaction(s): Other (See Comments) Nightmares   Antihistamines, Diphenhydramine-Type Nausea Only   Aspirin Other (See Comments)    Patient on blood  thinners and was told not to take   Benadryl [Diphenhydramine Hcl] Hives   Codeine Other (See Comments)    AMS    Family History  Problem Relation Age of Onset   Stomach cancer Mother    Heart attack Father    Breast cancer Neg Hx     Family history reviewed and not pertinent    Prior to Admission medications   Medication Sig Start Date End Date Taking? Authorizing Provider  ascorbic acid (VITAMIN C) 100 MG tablet Take 1,000 mg by mouth daily.    [provider]  cyanocobalamin (VITAMIN B12) 1000 MCG tablet Take 1 tablet (1,000 mcg total) by mouth daily. 01/22/24   Rickard Patience, MD  donepezil (ARICEPT) 10 MG tablet Take 10 mg by mouth at bedtime. Patient not taking: Reported on 01/31/2024 08/27/22   [provider]  ergocalciferol (VITAMIN D2) 1.25 MG (50000 UT) capsule Take 50,000 Units by mouth every Friday.    [provider]  ferrous sulfate 325 (65 FE) MG tablet Take by mouth.    [provider]  levothyroxine (SYNTHROID) 125 MCG tablet Take 125 mcg by mouth daily before breakfast.    [provider]  polyethylene glycol (MIRALAX / GLYCOLAX) 17 g packet Take 17  g by mouth daily. 02/09/24   Enedina Finner, MD  rosuvastatin (CRESTOR) 20 MG tablet Take 20 mg by mouth daily.    [provider]  senna (SENOKOT) 8.6 MG TABS tablet Take 1 tablet (8.6 mg total) by mouth daily. 02/09/24   Enedina Finner, MD  sertraline (ZOLOFT) 25 MG tablet Take 25 mg by mouth daily. Patient not taking: Reported on 01/31/2024 08/27/22   [provider]     Objective    Physical Exam: Vitals:   02/15/24 2345  BP: 109/67  Pulse: 68  Resp: 18  Temp: 100.3 F (37.9 C)  TempSrc: Oral  SpO2: 99%  Weight: 49.5 kg  Height: 4' 9.01" (1.448 m)    General: appears to be stated age; somnolent, will briefly open eyes to verbal stimuli.  Skin: warm, dry, no rash Head:  recent Burr hold noted; otherwise, AT/[place] Mouth:  Oral mucosa membranes appear moist,  normal dentition Neck: supple; trachea midline Heart:  RRR; did not appreciate any M/R/G Lungs: CTAB, did not appreciate any wheezes, rales, or rhonchi Abdomen: + BS; soft, ND, NT Vascular: 2+ pedal pulses b/l; 2+ radial pulses b/l Extremities: no peripheral edema, no muscle wasting Neuro: in setting of the patient's current mental status and associated inability to follow instructions, unable to perform full neurologic exam at this time.   No tremors.      Labs on Admission: I have personally reviewed following labs and imaging studies  CBC: Recent Labs  Lab 02/09/24 0544 02/12/24 2240 02/13/24 1112 02/15/24 0537  WBC 8.3 10.8* 9.6 7.4  NEUTROABS 5.7  --   --  5.5  HGB 11.0* 9.5* 10.9* 9.5*  HCT 32.5* 27.7* 32.4* 27.4*  MCV 90.5 91.1 91.3 89.0  PLT 76* 50* 50* 54*   Basic Metabolic Panel: Recent Labs  Lab 02/09/24 0544 02/12/24 2240 02/13/24 1112 02/15/24 0537  NA 138 134* 136 135  K 4.3 4.2 4.3 4.0  CL 106 103 102 104  CO2 25 24 24 23   GLUCOSE 97 121* 153* 104*  BUN 16 16 14 13   CREATININE 1.04* 1.23* 1.04* 0.94  CALCIUM 8.8* 8.4* 8.9 8.5*   GFR: Estimated Creatinine Clearance: 38.3 mL/min (by C-G formula based on SCr of 0.94 mg/dL). Liver Function Tests: Recent Labs  Lab 02/09/24 0544 02/12/24 2240  AST 18 20  ALT 15 20  ALKPHOS 49 47  BILITOT 0.5 0.5  PROT 5.9* 5.6*  ALBUMIN 2.9* 2.6*   No results for input(s): "LIPASE", "AMYLASE" in the last 168 hours. No results for input(s): "AMMONIA" in the last 168 hours. Coagulation Profile: No results for input(s): "INR", "PROTIME" in the last 168 hours. Cardiac Enzymes: No results for input(s): "CKTOTAL", "CKMB", "CKMBINDEX", "TROPONINI" in the last 168 hours. BNP (last 3 results) No results for input(s): "PROBNP" in the last 8760 hours. HbA1C: No results for input(s): "HGBA1C" in the last 72 hours. CBG: Recent Labs  Lab 02/14/24 2127 02/15/24 0601 02/15/24 1144 02/15/24 1648 02/15/24 2222   GLUCAP 135* 88 137* 101* 114*   Lipid Profile: No results for input(s): "CHOL", "HDL", "LDLCALC", "TRIG", "CHOLHDL", "LDLDIRECT" in the last 72 hours. Thyroid Function Tests: No results for input(s): "TSH", "T4TOTAL", "FREET4", "T3FREE", "THYROIDAB" in the last 72 hours. Anemia Panel: No results for input(s): "VITAMINB12", "FOLATE", "FERRITIN", "TIBC", "IRON", "RETICCTPCT" in the last 72 hours. Urine analysis:    Component Value Date/Time   COLORURINE YELLOW 02/12/2024 2219   APPEARANCEUR HAZY (A) 02/12/2024 2219   LABSPEC 1.019 02/12/2024 2219  PHURINE 6.0 02/12/2024 2219   GLUCOSEU NEGATIVE 02/12/2024 2219   HGBUR NEGATIVE 02/12/2024 2219   BILIRUBINUR NEGATIVE 02/12/2024 2219   KETONESUR NEGATIVE 02/12/2024 2219   PROTEINUR NEGATIVE 02/12/2024 2219   NITRITE NEGATIVE 02/12/2024 2219   LEUKOCYTESUR NEGATIVE 02/12/2024 2219    Radiological Exams on Admission: CT HEAD WO CONTRAST ( ) Addendum Date: 02/15/2024 ADDENDUM REPORT: 02/15/2024 18:40 ADDENDUM: These results were called by telephone at the time of interpretation on 02/15/2024 at 6:36 pm to provider Alison Reining, PA, who verbally acknowledged these results. Electronically Signed   By: Emily Filbert M.D.   On: 02/15/2024 18:40   Result Date: 02/15/2024 CLINICAL DATA:  Subdural hematoma, increased confusion and inattention on exam. EXAM: CT HEAD WITHOUT CONTRAST TECHNIQUE: Contiguous axial images were obtained from the base of the skull through the vertex without intravenous contrast. RADIATION DOSE REDUCTION: This exam was performed according to the departmental dose-optimization program which includes automated exposure control, adjustment of the mA and/or kV according to patient size and/or use of iterative reconstruction technique. COMPARISON:  CT head 01/31/2024 FINDINGS: Brain: Redemonstrated postsurgical changes of both subdural spaces with interval removal of surgical drains. There is a mixed attenuation subdural collection  over the left cerebral convexity which measures up to 13 mm in thickness, previously measuring 11 mm. Associated mass effect on the parenchyma is similar to prior. There are additional areas of acute blood products posteriorly over the left parietal lobe measuring up to 5 mm in maximum coronal thickness. Additional blood products over the posterior left occipital lobe adjacent to the transverse sinus measuring up to 8 mm in thickness. Minimal residual subdural collection over the right parietal lobe near the vertex. Approximately 2 mm rightward midline shift is similar to prior. Interval resolution of pneumocephalus. Similar chronic microvascular ischemic changes. 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 Small remote infarcts in the left cerebellum. Generalized parenchymal volume loss. The basilar cisterns are patent. Vascular: Atherosclerosis of the carotid siphons. No hyperdense vessel. Skull: Normal. Negative for fracture or focal lesion. Sinuses/Orbits: Orbits are symmetric. Mild mucosal thickening in the ethmoid sinuses and left sphenoid sinus. Other: Mastoid air cells are clear. IMPRESSION: Slightly increased mixed attenuation subdural collection over the left cerebral convexity measuring up to 13 mm, previously 11 mm. New focus of acute blood products posteriorly over the left parietal lobe measuring up to 5 mm in thickness. Additional acute blood products over the posterior left occipital lobe measuring up to 8 mm in thickness. Overall similar mass effect with approximately 2 mm rightward midline shift. Chronic ischemic disease as above. Electronically Signed: By: Emily Filbert M.D. On: 02/15/2024 18:19      Assessment/Plan   Principal Problem:   Subdural hematoma (HCC) Active Problems:   HLD (hyperlipidemia)   DM2 (diabetes mellitus, type 2) (HCC)   Thrombocytopenia (HCC)   Acute encephalopathy   Acute anemia   Acquired hypothyroidism    #) Multiple subdural hematomas: In the setting  of recent hospitalization for several dural hematoma prompting bur hole on 01/30/2024, updated CT head obtained on 4/7 shows slight interval increase in size of previously noted subdural hematoma over the left cerebral convexity, now measuring up to 13 mm in thickness relative to prior measurement of 11 mm per most recent previous CT head from 01/31/2024.  Additionally this updated CT head also shows evidence of 2 new small subdural hematomas, including a new subdural hematoma involving the left parietal lobe, measuring up to 5 mm in thickness, as well as  a new subdural hematoma involving the posterior left occipital lobe, measuring up to 8 mm in thickness.  The CT head shows no evidence of significant change in mass effect, with stable appearance of previously noted 2 mm rightward shift.    No interval trauma reported and no interval blood thinners.  She has noted to be more somnolent, confused, as further noted above.  Vital signs appear stable, including evidence of Cushing's triad suggest increasing intracranial pressure, including no evidence of bradycardia or hypertension or irregular respirations.  Hemoglobin relative to her subacute anemia appears stable,.  Relevant additional history includes her chronic thrombocytopenia, which is also stable, with updated platelet count trending up relative to 48 hours ago.  npatient rehab PA, Alison Lopez, discussed CT head findings from 4/7 with on-call neurosurgery, Dr. Marcell Barlow, who felt that the new subdural hematomas were small, did not warrant surgical intervention, and recommended supportive care, medical management, along with repeat CT head on the AM of 4/8. Dr. Marcell Barlow to continue to follow.   Plan: Neurosurgery to follow: Repeat CT head on the morning of 02/16/2024, as above.  Monitor on telemetry.  Every 4 hours neurochecks ordered.  Head of the bed at 30 degrees.  Prn IV hydralazine ordered for systolic blood pressure greater than 160 m refraining from  pharmacologic DVT prophylaxis.  SCDs.  Repeat CBC in the morning.mHg.  Type and screen ordered.  Check PTT, INR.                      #) Acute encephalopathy: Report of increasing confusion, somnolence started on the morning 02/15/24.  Differential includes contribution from interval increase in previously noted subdural hematoma as well as potential contribution from 2 new subdural hematomas relative to the timing of most recent prior CT head performed on 01/31/2024, as further detailed above.  No report of any associated acute focal neurologic deficits as a consequence of this.  No report of any overt seizure-like activity.  In considering other potential contributions towards her acute encephalopathy, no overt evidence of underlying infectious process at this time, but will also check urinalysis.  She remains afebrile.  Will further expand evaluation of potential metabolic contributing factors, as further detailed below.  Plan: fall precautions. Delirium precautions. Repeat CMP/CBC in the AM. Check Mg level. check TSH, B12 level. Check vbg.  Repeat CT head in the morning, as above.  Further evaluation management of level subdural hematomas, as above.  Every 4 hours neurochecks.  Add on CPK level.  Check urinalysis.                     #) Subacute anemia: Documented history of such, with baseline hemoglobin range 9.5-11.5 on 01/30/2024, corresponding with hospitalization for subdural hematoma.  As noted above, updated CT head on 02/15/2024, shows slight interval progression of previously noted subdural hematoma as well as identification of 2 new subdural hematomas, as further outlined above.  Otherwise, no evidence of acute blood loss at this time, hemoglobin checked this morning remains within his baseline range.   Plan: Repeat CT head in the morning. Check type and screen, PTT, INR.  Repeat CBC in the morning.                    #) Type 2 Diabetes  [place]: documented history of such.  Managed via lifestyle modifications in the absence of any exogenous insulin or oral hypoglycemic agents, with most recent hemoglobin A1c level noted to  be 4.8% when checked on 01/30/2024.  Most recent CBG noted to be 104.    Plan: accuchecks QAC and HS with very low dose SSI.                      #) Hyperlipidemia: documented h/o such. On high intensity rosuvastatin as outpatient.   Plan: continue home statin.                       #) acquired hypothyroidism: documented h/o such, on Synthroid as outpatient.   Plan: cont home Synthroid.  Check TSH.                     #) Chronic thrombocytopenia: Documented history of such, dating back to at least 2020, with associated baseline platelet count range of 50-80, with CBC checked on the morning of 02/15/2024 reflecting this baseline platelet range, including interval increase relative to platelet count 48 hours prior.  Not on any blood thinners.  Plan: Repeat CBC in the morning.  Refraining from pharmacologic DVT relaxants.     DVT prophylaxis: SCD's   Code Status: Full code Family Communication: none Disposition Plan: Per Rounding Team Consults called: Alison Reining, PA at inpatient rehab has discussed pt's case and imaging with on-call neurosurgery, Dr Marcell Barlow, as further detailed above;  Admission status: Inpatient     I SPENT GREATER THAN 75  MINUTES IN CLINICAL CARE TIME/MEDICAL DECISION-MAKING IN COMPLETING THIS ADMISSION.      Chaney Born Melody Savidge DO [facility]  From 7PM - 7AM   02/16/2024, 12:44 AM

## 2024-02-16 NOTE — Progress Notes (Signed)
   02/16/24 1525  TOC Brief Assessment  Insurance and Status Reviewed ([employer]  Medicare HMO)  Patient has primary care physician Yes Greggory Stallion, Marylin Crosby, MD)  Home environment has been reviewed From CIR  Lives with  Daughter  Prior level of function: Uses [employer]  Prior/Current Home Services No current home services  Social Drivers of Health Review SDOH reviewed interventions complete  Readmission risk has been reviewed Yes (17%)  Transition of care needs no transition of care needs at this time   Patient will DC back to CIR

## 2024-02-16 NOTE — Progress Notes (Signed)
 Inpatient Rehabilitation Discharge Medication Review by a Pharmacist  A complete drug regimen review was completed for this patient to identify any potential clinically significant medication issues.  High Risk Drug Classes Is patient taking? Indication by Medication  Antipsychotic No   Anticoagulant Yes DVT prophylaxis  Antibiotic No   Opioid No   Antiplatelet No   Hypoglycemics/insulin Yes Diabetes   Vasoactive Medication No   Chemotherapy No   APAP Aricept Synthroid  Ditropan  Protonix Crestor Zoloft Senna Ferrous Sulfate Vit B12 Yes Yes Yes Yes Yes Yes Yes Yes Yes Yes Pain Dementia Hypothyroid  Bladder issues GERD Hyperlipidemia Depression Bowel regimen Iron deficiency  B-12 deficiency      Type of Medication Issue Identified Description of Issue Recommendation(s)  Drug Interaction(s) (clinically significant)     Duplicate Therapy     Allergy     No Medication Administration End Date     Incorrect Dose     Additional Drug Therapy Needed     Significant med changes from prior encounter (inform family/care partners about these prior to discharge).    Other       Clinically significant medication issues were identified that warrant physician communication and completion of prescribed/recommended actions by midnight of the next day:  Patient just transferred back to inpatient status, inpatient provider hasn't reviewed anything yet, we will review this once inpatient provider orders meds to check for any discrepancies   Name of provider notified for urgent issues identified: None   Pharmacist comments: Patient just transferred back to inpatient status, inpatient provider hasn't reviewed anything yet, we will review this once inpatient provider orders meds to check for any discrepancies   Time spent performing this drug regimen review (minutes):  10  Abran Duke, PharmD, BCPS Clinical Pharmacist Phone: [phone]

## 2024-02-16 NOTE — Progress Notes (Signed)
 Alison Lopez   DOB:10-Nov-1956   ZO#:[id number]   [id number]#:[id number]  Hematology follow-up note  Subjective: Patient is known to our service, previously seen by Dr. Smith Robert and Cathie Hoops for thrombocytopenia and antiphospholipid syndrome.  She unfortunately developed by lateral subdural hematoma when she was on Eliquis on January 29, 2024, and underwent subdural drain placement on each side by Dr.  Myer Haff on January 30, 2024.  Patient is currently on [facility] inpatient rehabilitation, we will call her to evaluate her thrombocytopenia and the role of anticoagulation.  Patient has baseline dementia, and developed expressive aphasia after recent subdural hematoma.  I spoke with her daughter at the bedside.   Objective:  Vitals:   02/16/24 1940 02/16/24 2000  BP: 127/73 125/71  Pulse:    Resp: 14 10  Temp:    SpO2:      Body mass index is 23.56 kg/m. No intake or output data in the 24 hours ending 02/16/24 2225   Sclerae unicteric  No ecchymosis or petechia  Oropharynx clear  MSK no focal spinal tenderness, no peripheral edema      CBG (last 3)  Recent Labs    02/16/24 1144 02/16/24 1611 02/16/24 1943  GLUCAP 92 91 116*     Labs:  Lab Results  Component Value Date   WBC 7.2 02/16/2024   HGB 9.7 (L) 02/16/2024   HCT 28.4 (L) 02/16/2024   MCV 90.2 02/16/2024   PLT 63 (L) 02/16/2024   NEUTROABS 4.8 02/16/2024    Urine Studies No results for input(s): "UHGB", "CRYS" in the last 72 hours.  Invalid input(s): "UACOL", "UAPR", "USPG", "UPH", "UTP", "UGL", "UKET", "UBIL", "UNIT", "UROB", "ULEU", "UEPI", "UWBC", "URBC", "UBAC", "CAST", "UCOM", "BILUA"  Basic Metabolic Panel: Recent Labs  Lab 02/12/24 2240 02/13/24 1112 02/15/24 0537 02/16/24 0436  NA 134* 136 135 134*  K 4.2 4.3 4.0 4.2  CL 103 102 104 103  CO2 24 24 23  21*  GLUCOSE 121* 153* 104* 106*  BUN 16 14 13 14   CREATININE 1.23* 1.04* 0.94 0.96  CALCIUM 8.4* 8.9 8.5* 8.6*  MG  --   --   --  2.0   GFR Estimated  Creatinine Clearance: 37.5 mL/min (by C-G formula based on SCr of 0.96 mg/dL). Liver Function Tests: Recent Labs  Lab 02/12/24 2240 02/16/24 0436  AST 20 19  ALT 20 25  ALKPHOS 47 59  BILITOT 0.5 0.3  PROT 5.6* 5.7*  ALBUMIN 2.6* 2.5*   No results for input(s): "LIPASE", "AMYLASE" in the last 168 hours. No results for input(s): "AMMONIA" in the last 168 hours. Coagulation profile Recent Labs  Lab 02/16/24 0719  INR 1.1    CBC: Recent Labs  Lab 02/12/24 2240 02/13/24 1112 02/15/24 0537 02/16/24 0436  WBC 10.8* 9.6 7.4 7.2  NEUTROABS  --   --  5.5 4.8  HGB 9.5* 10.9* 9.5* 9.7*  HCT 27.7* 32.4* 27.4* 28.4*  MCV 91.1 91.3 89.0 90.2  PLT 50* 50* 54* 63*   Cardiac Enzymes: Recent Labs  Lab 02/16/24 0436  CKTOTAL 17*   BNP: Invalid input(s): "POCBNP" CBG: Recent Labs  Lab 02/15/24 2222 02/16/24 0819 02/16/24 1144 02/16/24 1611 02/16/24 1943  GLUCAP 114* 97 92 91 116*   D-Dimer No results for input(s): "DDIMER" in the last 72 hours. Hgb A1c No results for input(s): "HGBA1C" in the last 72 hours. Lipid Profile No results for input(s): "CHOL", "HDL", "LDLCALC", "TRIG", "CHOLHDL", "LDLDIRECT" in the last 72 hours. Thyroid function studies Recent Labs  02/16/24 0436  TSH 0.465   Anemia work up Recent Labs    02/15/24 0630  VITAMINB12 323   Microbiology No results found for this or any previous visit (from the past 240 hours).    Studies:  CT HEAD WO CONTRAST ( ) Result Date: 02/16/2024 CLINICAL DATA:  68 year old female with left subdural hematoma. EXAM: CT HEAD WITHOUT CONTRAST TECHNIQUE: Contiguous axial images were obtained from the base of the skull through the vertex without intravenous contrast. RADIATION DOSE REDUCTION: This exam was performed according to the departmental dose-optimization program which includes automated exposure control, adjustment of the mA and/or kV according to patient size and/or use of iterative reconstruction  technique. COMPARISON:  Head CT yesterday and earlier. FINDINGS: Brain: Mixed density left side subdural hematoma redemonstrated. Thickness is 9-10 mm on coronal images and not changed from yesterday. Minority of hyperdense blood products not significantly changed. Trace subdural space gas also again noted on coronal image 15. Smaller mixed density right side subdural hematoma which is primarily limited to the posterior convexity is 4-5 mm, stable on coronal image 23. No significant midline shift. No significant mass effect on the ventricles. Basilar cisterns remain patent. No ventriculomegaly. No new intracranial hemorrhage identified. Stable gray-white matter differentiation throughout the brain. Extensive chronic occipital and parietal lobe encephalomalacia, small chronic cerebellar infarcts. Vascular: Calcified atherosclerosis at the skull base. No suspicious intracranial vascular hyperdensity. Skull: Stable previous vertex burr holes. Sinuses/Orbits: Visualized paranasal sinuses and mastoids are stable and well aerated. Other: No acute orbit or scalp soft tissue finding. IMPRESSION: 1. Bilateral, left greater than right mixed density Subdural Hematomas are stable from yesterday. Left side up to 10 mm, and smaller on the Right 4-5 mm. 2. No significant intracranial mass effect. No new intracranial abnormality. 3. Advanced Chronic ischemic disease. Electronically Signed   By: Odessa Fleming M.D.   On: 02/16/2024 13:35   CT HEAD WO CONTRAST ( ) Addendum Date: 02/15/2024 ADDENDUM REPORT: 02/15/2024 18:40 ADDENDUM: These results were called by telephone at the time of interpretation on 02/15/2024 at 6:36 pm to provider Delle Reining, PA, who verbally acknowledged these results. Electronically Signed   By: Emily Filbert M.D.   On: 02/15/2024 18:40   Result Date: 02/15/2024 CLINICAL DATA:  Subdural hematoma, increased confusion and inattention on exam. EXAM: CT HEAD WITHOUT CONTRAST TECHNIQUE: Contiguous axial images were  obtained from the base of the skull through the vertex without intravenous contrast. RADIATION DOSE REDUCTION: This exam was performed according to the departmental dose-optimization program which includes automated exposure control, adjustment of the mA and/or kV according to patient size and/or use of iterative reconstruction technique. COMPARISON:  CT head 01/31/2024 FINDINGS: Brain: Redemonstrated postsurgical changes of both subdural spaces with interval removal of surgical drains. There is a mixed attenuation subdural collection over the left cerebral convexity which measures up to 13 mm in thickness, previously measuring 11 mm. Associated mass effect on the parenchyma is similar to prior. There are additional areas of acute blood products posteriorly over the left parietal lobe measuring up to 5 mm in maximum coronal thickness. Additional blood products over the posterior left occipital lobe adjacent to the transverse sinus measuring up to 8 mm in thickness. Minimal residual subdural collection over the right parietal lobe near the vertex. Approximately 2 mm rightward midline shift is similar to prior. Interval resolution of pneumocephalus. Similar chronic microvascular ischemic changes. 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 Small remote infarcts in the left cerebellum. Generalized parenchymal volume loss. The basilar  cisterns are patent. Vascular: Atherosclerosis of the carotid siphons. No hyperdense vessel. Skull: Normal. Negative for fracture or focal lesion. Sinuses/Orbits: Orbits are symmetric. Mild mucosal thickening in the ethmoid sinuses and left sphenoid sinus. Other: Mastoid air cells are clear. IMPRESSION: Slightly increased mixed attenuation subdural collection over the left cerebral convexity measuring up to 13 mm, previously 11 mm. New focus of acute blood products posteriorly over the left parietal lobe measuring up to 5 mm in thickness. Additional acute blood products over the posterior  left occipital lobe measuring up to 8 mm in thickness. Overall similar mass effect with approximately 2 mm rightward midline shift. Chronic ischemic disease as above. Electronically Signed: By: Emily Filbert M.D. On: 02/15/2024 18:19    Assessment: 68 y.o.  Spontaneous bilateral subdural hematoma due to Eliquis, s/p drainage on 01/30/24 Antiphospholipid syndrome with history of DVT Moderate thrombocytopenia secondary to #2 and recent bleeding Anemia Dementia   Plan:  -She has chronic thrombocytopenia secondary to antiphospholipid syndrome, slightly worse recently due to subdural hemorrhage. -I reached out to Dr.  Myer Haff who recommends to continue holding anticoagulation for at least 2 more weeks, plan to repeat a CT head in 2 weeks. -Patient is at very high risk of bleeding due to her moderate thrombocytopenia, and a recent episode of subdural hemorrhage.  She unfortunately is also at high risk of fall thrombosis due to antiphospholipid syndrome.  Plan to reevaluate her in 2 to 4 weeks, to see if we can safely restart anticoagulation with prophylactic dose Eliquis 2.5 mg twice daily -Please use SCD when she is in the hospital bed -Continue physical therapy, to increase her mobility, which will reduce her risk of thrombosis. -I will f/u as needed during her hospital stay.  Please inform Dr. Cathie Hoops when she will be discharged home.    Malachy Mood, MD 02/16/2024

## 2024-02-16 NOTE — Progress Notes (Signed)
 Inpatient Rehabilitation Care Coordinator Discharge Note   Patient Details  Name: Alison Lopez MRN: [id number] Date of Birth: August 08, 1956   Discharge location: D/c to acute due to medical reasons  Length of Stay: 7 days  Discharge activity level: CGA  Home/community participation: Limited  Patient response JY:NWGNFA Literacy - How often do you need to have someone help you when you read instructions, pamphlets, or other written material from your doctor or pharmacy?: Often  Patient response OZ:HYQMVH Isolation - How often do you feel lonely or isolated from those around you?: Patient unable to respond  Services provided included: MD, RD, PT, OT, SLP, CM, Neuropsych, SW, Pharmacy, TR, Industrial/product designer Services:  Field seismologist Utilized: [employer]  Choices offered to/list presented to: N/Alison  Follow-up services arranged:              Patient response to transportation need: Is the patient able to respond to transportation needs?: Yes In the past 12 months, has lack of transportation kept you from medical appointments or from getting medications?: No In the past 12 months, has lack of transportation kept you from meetings, work, or from getting things needed for daily living?: No   Patient/Family verbalized understanding of follow-up arrangements:  Yes  Individual responsible for coordination of the follow-up plan: contact pt dtr Alison Lopez  Confirmed correct DME delivered: Gretchen Short 02/16/2024    Comments (or additional information):  Summary of Stay    Date/Time Discharge Planning CSW  02/09/24 0938 TBA. Per EMR, pt will d/c to home with support from family; pt dtr Hospital doctor working on arranging 24/7 care. SW will confirm there are no barriers to discharge. AAC       Alison Lopez Alison Lopez

## 2024-02-16 NOTE — Plan of Care (Signed)

## 2024-02-16 NOTE — Progress Notes (Signed)
 I recently operated on this patient at [facility] and was contacted after she got an additional CT scan.  I reviewed and recommended repeating CT this AM.  That CT is stable.  - Plan for repeat CT in 2 weeks or for any significant change in neurological status - Hold therapeutic anticoagulation for now - Likely will only need SCD for DVT PPX given thrombocytopenia and elevated PTT but will defer to primary for final determination

## 2024-02-16 NOTE — Progress Notes (Addendum)
 This is in a progress note as patient was seen and examined and admitted overnight by Dr. Arva Chafe -Patient was seen and examined, labs, imaging were reviewed. Patient was admitted from CIR, for confusion, CT head with evidence of worsening subdural hematoma, so she was transferred to [facility] for further management and workup, recommendation were by her neurosurgeon to repeat CT head in a.m. to evaluate for stability, CT head was obtained, it was reviewed, I have discussed this with her neurosurgeon Dr. Marcell Barlow, he reports CT head at this point appears to be stable, no recommendation for neurosurgery workup or intervention, recommendation to repeat CT head in 14 days, and to hold all forms of full anticoagulation, she can receive DVT prophylaxis if indicated as long her platelet count is > 50K, for now we will try to ambulate with PT, OT and do SCDs, if poor ambulatory then will do DVT prophylaxis. - Daughter was called and updated - PT/OT/SLP were consulted, patient will need to be readmitted back to CIR Huey Bienenstock MD

## 2024-02-17 DIAGNOSIS — I6200 Nontraumatic subdural hemorrhage, unspecified: Secondary | ICD-10-CM | POA: Diagnosis not present

## 2024-02-17 DIAGNOSIS — G9340 Encephalopathy, unspecified: Secondary | ICD-10-CM | POA: Diagnosis not present

## 2024-02-17 DIAGNOSIS — E039 Hypothyroidism, unspecified: Secondary | ICD-10-CM | POA: Diagnosis not present

## 2024-02-17 DIAGNOSIS — D649 Anemia, unspecified: Secondary | ICD-10-CM | POA: Diagnosis not present

## 2024-02-17 DIAGNOSIS — S065XAA Traumatic subdural hemorrhage with loss of consciousness status unknown, initial encounter: Secondary | ICD-10-CM | POA: Diagnosis not present

## 2024-02-17 LAB — GLUCOSE, CAPILLARY
Glucose-Capillary: 105 mg/dL — ABNORMAL HIGH (ref 70–99)
Glucose-Capillary: 159 mg/dL — ABNORMAL HIGH (ref 70–99)
Glucose-Capillary: 146 mg/dL — ABNORMAL HIGH (ref 70–99)
Glucose-Capillary: 95 mg/dL (ref 70–99)

## 2024-02-17 LAB — BASIC METABOLIC PANEL WITH GFR
Anion gap: 12 (ref 5–15)
BUN: 16 mg/dL (ref 8–23)
CO2: 20 mmol/L — ABNORMAL LOW (ref 22–32)
Calcium: 8.7 mg/dL — ABNORMAL LOW (ref 8.9–10.3)
Chloride: 105 mmol/L (ref 98–111)
Creatinine, Ser: 0.78 mg/dL (ref 0.44–1.00)
GFR, Estimated: >60 mL/min (ref >60)
Glucose, Bld: 100 mg/dL — ABNORMAL HIGH (ref 70–99)
Potassium: 4.3 mmol/L (ref 3.5–5.1)
Sodium: 137 mmol/L (ref 135–145)

## 2024-02-17 LAB — CBC
HCT: 31.3 % — ABNORMAL LOW (ref 36.0–46.0)
Hemoglobin: 10.7 g/dL — ABNORMAL LOW (ref 12.0–15.0)
MCH: 30.5 pg (ref 26.0–34.0)
MCHC: 34.2 g/dL (ref 30.0–36.0)
MCV: 89.2 fL (ref 80.0–100.0)
Platelets: 74 10*3/uL — ABNORMAL LOW (ref 150–400)
RBC: 3.51 MIL/uL — ABNORMAL LOW (ref 3.87–5.11)
RDW: 12.9 % (ref 11.5–15.5)
WBC: 6.5 10*3/uL (ref 4.0–10.5)
nRBC: 0 % (ref 0.0–0.2)

## 2024-02-17 LAB — BLOOD GAS, VENOUS
Acid-Base Excess: 1.3 mmol/L (ref 0.0–2.0)
Bicarbonate: 25.9 mmol/L (ref 20.0–28.0)
Drawn by: 60076
O2 Saturation: 65.3 %
Patient temperature: 37
pCO2, Ven: 40 mmHg — ABNORMAL LOW (ref 44–60)
pH, Ven: 7.42 (ref 7.25–7.43)
pO2, Ven: 36 mmHg (ref 32–45)

## 2024-02-17 MED ORDER — SERTRALINE HCL 25 MG PO TABS
25.0000 mg | ORAL_TABLET | Freq: Every day | ORAL | Status: DC
  Administered 2024-02-17 – 2024-02-19 (×3): 25 mg via ORAL
  Filled 2024-02-17 (×3): qty 1

## 2024-02-17 MED ORDER — VITAMIN B-12 1000 MCG PO TABS
1000.0000 ug | ORAL_TABLET | Freq: Every day | ORAL | Status: DC
  Administered 2024-02-17 – 2024-02-19 (×3): 1000 ug via ORAL
  Filled 2024-02-17 (×3): qty 1

## 2024-02-17 MED ORDER — MELATONIN 5 MG PO TABS
5.0000 mg | ORAL_TABLET | Freq: Every day | ORAL | Status: DC
  Administered 2024-02-18: 5 mg via ORAL
  Filled 2024-02-17: qty 1

## 2024-02-17 MED ORDER — FERROUS SULFATE 325 (65 FE) MG PO TABS
325.0000 mg | ORAL_TABLET | Freq: Every day | ORAL | Status: DC
  Administered 2024-02-17 – 2024-02-19 (×3): 325 mg via ORAL
  Filled 2024-02-17 (×3): qty 1

## 2024-02-17 MED ORDER — DONEPEZIL HCL 10 MG PO TABS
10.0000 mg | ORAL_TABLET | Freq: Every day | ORAL | Status: DC
  Administered 2024-02-17 – 2024-02-18 (×2): 10 mg via ORAL
  Filled 2024-02-17 (×2): qty 1

## 2024-02-17 NOTE — Evaluation (Signed)
 Occupational Therapy Evaluation Patient Details Name: Alison Lopez MRN: [id number] DOB: 02-Nov-1956 Today's Date: 02/17/2024   History of Present Illness   Pt is a 68 y/o F admitted on 01/29/24 with c/o AMS. Pt found to have bilateral SDH & underwent bilateral frontal burr hole for drainage. Pt d/c to inpatient rehab on 02/08/24. Pt noted to have increased confusion on 02/15/24. CT showing worsening SDH & pt transferred to acute setting. Neurosurgery recommending monitoring with no surgical intervention at this time. PMH: dementia, DVT on eliquis, chronic thrombocytopenia, B12 deficiency, HTN, anxiety, collagen vascular disease, COPD, lumbar DDD, depression, DM, fibromyalgia, Graves' disease, HLD, lupus     Clinical Impressions Pt resting comfortably in bed, no complaints. Pt able to fully participate, A/O x3, knows year, not sure of month/day, oriented to situation/location, and self. Pt states she lives with daughter who works during the day, PLOF daughter helps with all ADLs/IADLs, able to self feed, Pt legally blind. Pt currently appears close to her stated baseline, able to complete ADLs with min-mod A, main limiting factor is blindness, good overall strength and ROM to complete ADLs but needs [facility] assist for dressing, min A for feeding, able to ambulate around room at [facility] for direction. Per notes, plan is to return home with 24/7 supervision, HHOT recommended if plan is to return home, will continue to see acutely to progress as able.      If plan is discharge home, recommend the following:   A little help with bathing/dressing/bathroom;A little help with walking and/or transfers;Assistance with cooking/housework;Assistance with feeding;Assist for transportation;Help with stairs or ramp for entrance     Functional Status Assessment   Patient has had a recent decline in their functional status and demonstrates the ability to make significant improvements in function in a reasonable and  predictable amount of time.     Equipment Recommendations   Other (comment) (TBD)     Recommendations for Other Services         Precautions/Restrictions   Precautions Precautions: Fall Recall of Precautions/Restrictions: Impaired Precaution/Restrictions Comments: blind Restrictions Weight Bearing Restrictions Per Provider Order: No     Mobility Bed Mobility Overal bed mobility: Needs Assistance Bed Mobility: Supine to Sit, Sit to Supine     Supine to sit: Min assist, HOB elevated, Used rails Sit to supine: Contact guard assist, Used rails   General bed mobility comments: min A to assist OOB with HOB, CGA return to bed    Transfers Overall transfer level: Needs assistance Equipment used: Rolling walker (2 wheels) Transfers: Sit to/from Stand, Bed to chair/wheelchair/BSC Sit to Stand: Contact guard assist     Step pivot transfers: Contact guard assist     General transfer comment: CGA for guiding due to blindness      Balance Overall balance assessment: Needs assistance Sitting-balance support: No upper extremity supported, Feet supported Sitting balance-Leahy Scale: Good Sitting balance - Comments: sitting EOB with ADLs   Standing balance support: Single extremity supported, During functional activity Standing balance-Leahy Scale: Fair Standing balance comment: able to stand with one hand supported, no LOB                           ADL either performed or assessed with clinical judgement   ADL Overall ADL's : Needs assistance/impaired;At baseline Eating/Feeding: Minimal assistance   Grooming: Set up;Sitting   Upper Body Bathing: Minimal assistance;Sitting   Lower Body Bathing: Minimal assistance;Sit to/from stand   Upper Body  Dressing : Minimal assistance;Sitting   Lower Body Dressing: Moderate assistance;Sit to/from stand   Toilet Transfer: Contact guard assist;Rolling walker (2 wheels)   Toileting- Clothing Manipulation and  Hygiene: Moderate assistance;Sit to/from stand       Functional mobility during ADLs: Contact guard assist;Rolling walker (2 wheels) General ADL Comments: Pt min-mod A for dressing/bathing, limited by blindness, good overall strength and ROM to complete tasks.     Vision Baseline Vision/History: 2 Legally blind Ability to See in Adequate Light: 4 Severely impaired Patient Visual Report: No change from baseline       Perception         Praxis         Pertinent Vitals/Pain Pain Assessment Pain Assessment: No/denies pain     Extremity/Trunk Assessment             Communication Communication Communication: Impaired Factors Affecting Communication: Reduced clarity of speech   Cognition Arousal: Alert Behavior During Therapy: Flat affect Cognition: Cognition impaired, History of cognitive impairments   Orientation impairments: Time       Executive functioning impairment (select all impairments): Sequencing, Problem solving OT - Cognition Comments: Grossly WFLs today, knows year, not month/day, knows location, why she is here, and oriented to self. Able to respond appropriately to questions and history, able to follow simple instructions >50% of time                 Following commands: Intact       Cueing  General Comments   Cueing Techniques: Verbal cues      Exercises     Shoulder Instructions      Home Living Family/patient expects to be discharged to:: Private residence Living Arrangements: Children;Other relatives Available Help at Discharge: Family;Available PRN/intermittently Type of Home: Mobile home Home Access: Stairs to enter Entrance Stairs-Number of Steps: 6 Entrance Stairs-Rails: Right Home Layout: One level     Bathroom Shower/Tub: Chief Strategy Officer: Standard     Home Equipment: Pharmacist, hospital (2 wheels);BSC/3in1   Additional Comments: Pt states she lives with her daughter.  Lives With:  Daughter    Prior Functioning/Environment Prior Level of Function : Needs assist             Mobility Comments: uses RW, reports no falls. ADLs Comments: Pt states her daughter helps with all dressing, bathing, able to self feed.    OT Problem List: Decreased strength;Decreased range of motion;Decreased activity tolerance;Decreased coordination;Impaired vision/perception;Impaired balance (sitting and/or standing);Decreased cognition;Impaired tone;Decreased safety awareness;Decreased knowledge of use of DME or AE;Impaired UE functional use   OT Treatment/Interventions: Self-care/ADL training;DME and/or AE instruction;Therapeutic activities;Balance training;Therapeutic exercise;Cognitive remediation/compensation;Manual therapy;Neuromuscular education;Modalities;Visual/perceptual remediation/compensation;Energy conservation;Splinting;Patient/family education      OT Goals(Current goals can be found in the care plan section)   Acute Rehab OT Goals Patient Stated Goal: to return home OT Goal Formulation: With patient Time For Goal Achievement: 03/02/24 Potential to Achieve Goals: Good   OT Frequency:  Min 2X/week    Co-evaluation              AM-PAC OT "6 Clicks" Daily Activity     Outcome Measure Help from another person eating meals?: A Little Help from another person taking care of personal grooming?: A Little Help from another person toileting, which includes using toliet, bedpan, or urinal?: A Lot Help from another person bathing (including washing, rinsing, drying)?: A Lot Help from another person to put on and taking off regular upper body clothing?: A  Little Help from another person to put on and taking off regular lower body clothing?: A Lot 6 Click Score: 15   End of Session Equipment Utilized During Treatment: Gait belt;Rolling walker (2 wheels) Nurse Communication: Mobility status  Activity Tolerance: Patient tolerated treatment well Patient left: in  bed;with call bell/phone within reach  OT Visit Diagnosis: Other abnormalities of gait and mobility (R26.89);Muscle weakness (generalized) (M62.81);Hemiplegia and hemiparesis Hemiplegia - Right/Left: Right                Time: 1610-9604 OT Time Calculation (min): 27 min Charges:  OT General Charges $OT Visit: 1 Visit OT Evaluation $OT Eval Low Complexity: 1 Low OT Treatments $Self Care/Home Management : 8-22 mins  [place], OTR/L   Alexis Goodell 02/17/2024, 3:38 PM

## 2024-02-17 NOTE — Plan of Care (Signed)
 progressing

## 2024-02-17 NOTE — Progress Notes (Signed)
 PROGRESS NOTE        PATIENT DETAILS Name: Alison Lopez Age: 68 y.o. Sex: female Date of Birth: 03-16-1956 Admit Date: 02/15/2024 Admitting Physician Angie Fava, DO ZOX:[id number], Marylin Crosby, MD  Brief Summary: Patient is a 68 y.o.  female with history of antiphospholipid syndrome/chronic thrombocytopenia-was recently hospitalized at [facility] from 3/21-3/31 for SDH requiring bur hole surgery-was subsequently transferred to CIR-admitted back to [facility] service on 4/7 for worsening confusion-CT head shows interval slight worsening of SDH.  Subsequent serial CT head stable-Case was discussed with patient's primary neurosurgeon-recommendations are to continue with supportive care-nonoperative management-and to repeat CT head in 2 weeks.    Significant events: 4/7>> admit to [facility] from [facility]-worsening confusion-interval worsening of SDH on CT head.  Significant studies: 4/7>> CT head: SDH-left cerebral convexity-measuring 13 mm-previously 11 mm.  New focus of blood in left parietal lobe-5 mm in thickness, acute blood products over left occipital lobe-8 mm intact mass. 4/8>> CT head: Unchanged hematomas compared to 4/7.  Significant microbiology data: None  Procedures: None  Consults: Phone consult-neurosurgery-Dr. Marcell Barlow.  Subjective: Lying comfortably in bed-denies any chest pain or shortness of breath.  Awake/alert-cooperative this morning.  Answering all my questions appropriately.  Working on her breakfast.  Objective: Vitals: Blood pressure 112/68, pulse 65, temperature 98.6 F (37 C), temperature source Oral, resp. rate 13, height 4' 9.01" (1.448 m), weight 49.3 kg, SpO2 96%.   Exam: Gen Exam:Alert awake-not in any distress HEENT:atraumatic, normocephalic Chest: B/L clear to auscultation anteriorly CVS:S1S2 regular Abdomen:soft non tender, non distended Extremities:no edema Neurology: Non focal Skin: no rash  Pertinent Labs/Radiology:    Latest  Ref Rng & Units 02/17/2024    4:28 AM 02/16/2024    4:36 AM 02/15/2024    5:37 AM  CBC  WBC 4.0 - 10.5 K/uL 6.5  7.2  7.4   Hemoglobin 12.0 - 15.0 g/dL 40.9  9.7  9.5   Hematocrit 36.0 - 46.0 % 31.3  28.4  27.4   Platelets 150 - 400 K/uL 74  63  54     Lab Results  Component Value Date   NA 137 02/17/2024   K 4.3 02/17/2024   CL 105 02/17/2024   CO2 20 (L) 02/17/2024     Assessment/Plan: SDH Awake/alert-nonfocal exam Supportive care-neurosurgery Dr. Marcell Barlow recommending repeat CT head in 2 weeks Holding all therapeutic/prophylactic anticoagulation until then  Acute metabolic encephalopathy/confusion Unclear etiology-confused at CIR but currently awake/alert-discussed with RN-no major issues overnight She apparently does have a history of dementia-and could just have sundowning. Since improved-answering questions appropriately-doubt any further workup is required Delirium precautions.  Vitamin B12 deficiency Continue supplementation  Normocytic anemia Hb stable No evidence of GI bleeding-watch closely  Hypothyroidism Synthroid TSH 4/8 stable.  HLD Statin  DM-2 (A1c 4.8 on 3/22) CBG stable-SSI  Recent Labs    02/16/24 1611 02/16/24 1943 02/17/24 0825  GLUCAP 91 116* 105*    HLD Statin  Antiphospholipid syndrome with history of DVT Thrombocytopenia secondary to antiphospholipid syndrome Hematology following-appreciate note on 4/8-holding anticoagulation for at least 2 weeks given SDH and concern for worsening. SCD's/frequent mobilization  Dementia Resume Aricept Delirium precautions  Mood disorder Resume Zoloft  BMI: Estimated body mass index is 23.51 kg/m as calculated from the following:   Height as of this encounter: 4' 9.01" (1.448 m).   Weight as of this  encounter: 49.3 kg.   Code status:   Code Status: Full Code   DVT Prophylaxis: SCDs Start: 02/16/24 0042   Family Communication: None at bedside   Disposition Plan: Status is:  Inpatient Remains inpatient appropriate because: Severity of illness   Planned Discharge Destination:Rehabilitation facility   Diet: Diet Order             Diet regular Room service appropriate? Yes; Fluid consistency: Thin  Diet effective now                     Antimicrobial agents: Anti-infectives (From admission, onward)    None        MEDICATIONS: Scheduled Meds:  insulin aspart  0-6 Units Subcutaneous TID WC   levothyroxine  125 mcg Oral QAC breakfast   rosuvastatin  20 mg Oral Daily   Continuous Infusions: PRN Meds:.acetaminophen **OR** acetaminophen, hydrALAZINE, melatonin, ondansetron (ZOFRAN) IV   I have personally reviewed following labs and imaging studies  LABORATORY DATA: CBC: Recent Labs  Lab 02/12/24 2240 02/13/24 1112 02/15/24 0537 02/16/24 0436 02/17/24 0428  WBC 10.8* 9.6 7.4 7.2 6.5  NEUTROABS  --   --  5.5 4.8  --   HGB 9.5* 10.9* 9.5* 9.7* 10.7*  HCT 27.7* 32.4* 27.4* 28.4* 31.3*  MCV 91.1 91.3 89.0 90.2 89.2  PLT 50* 50* 54* 63* 74*    Basic Metabolic Panel: Recent Labs  Lab 02/12/24 2240 02/13/24 1112 02/15/24 0537 02/16/24 0436 02/17/24 0428  NA 134* 136 135 134* 137  K 4.2 4.3 4.0 4.2 4.3  CL 103 102 104 103 105  CO2 24 24 23  21* 20*  GLUCOSE 121* 153* 104* 106* 100*  BUN 16 14 13 14 16   CREATININE 1.23* 1.04* 0.94 0.96 0.78  CALCIUM 8.4* 8.9 8.5* 8.6* 8.7*  MG  --   --   --  2.0  --     GFR: Estimated Creatinine Clearance: 44.9 mL/min (by C-G formula based on SCr of 0.78 mg/dL).  Liver Function Tests: Recent Labs  Lab 02/12/24 2240 02/16/24 0436  AST 20 19  ALT 20 25  ALKPHOS 47 59  BILITOT 0.5 0.3  PROT 5.6* 5.7*  ALBUMIN 2.6* 2.5*   No results for input(s): "LIPASE", "AMYLASE" in the last 168 hours. No results for input(s): "AMMONIA" in the last 168 hours.  Coagulation Profile: Recent Labs  Lab 02/16/24 0719  INR 1.1    Cardiac Enzymes: Recent Labs  Lab 02/16/24 0436  CKTOTAL 17*     BNP (last 3 results) No results for input(s): "PROBNP" in the last 8760 hours.  Lipid Profile: No results for input(s): "CHOL", "HDL", "LDLCALC", "TRIG", "CHOLHDL", "LDLDIRECT" in the last 72 hours.  Thyroid Function Tests: Recent Labs    02/16/24 0436  TSH 0.465    Anemia Panel: Recent Labs    02/15/24 0630  VITAMINB12 323    Urine analysis:    Component Value Date/Time   COLORURINE YELLOW 02/12/2024 2219   APPEARANCEUR HAZY (A) 02/12/2024 2219   LABSPEC 1.019 02/12/2024 2219   PHURINE 6.0 02/12/2024 2219   GLUCOSEU NEGATIVE 02/12/2024 2219   HGBUR NEGATIVE 02/12/2024 2219   BILIRUBINUR NEGATIVE 02/12/2024 2219   KETONESUR NEGATIVE 02/12/2024 2219   PROTEINUR NEGATIVE 02/12/2024 2219   NITRITE NEGATIVE 02/12/2024 2219   LEUKOCYTESUR NEGATIVE 02/12/2024 2219    Sepsis Labs: Lactic Acid, Venous    Component Value Date/Time   LATICACIDVEN 0.8 02/13/2024 0641    MICROBIOLOGY: No results found for  this or any previous visit (from the past 240 hours).  RADIOLOGY STUDIES/RESULTS: CT HEAD WO CONTRAST ( ) Result Date: 02/16/2024 CLINICAL DATA:  68 year old female with left subdural hematoma. EXAM: CT HEAD WITHOUT CONTRAST TECHNIQUE: Contiguous axial images were obtained from the base of the skull through the vertex without intravenous contrast. RADIATION DOSE REDUCTION: This exam was performed according to the departmental dose-optimization program which includes automated exposure control, adjustment of the mA and/or kV according to patient size and/or use of iterative reconstruction technique. COMPARISON:  Head CT yesterday and earlier. FINDINGS: Brain: Mixed density left side subdural hematoma redemonstrated. Thickness is 9-10 mm on coronal images and not changed from yesterday. Minority of hyperdense blood products not significantly changed. Trace subdural space gas also again noted on coronal image 15. Smaller mixed density right side subdural hematoma which is  primarily limited to the posterior convexity is 4-5 mm, stable on coronal image 23. No significant midline shift. No significant mass effect on the ventricles. Basilar cisterns remain patent. No ventriculomegaly. No new intracranial hemorrhage identified. Stable gray-white matter differentiation throughout the brain. Extensive chronic occipital and parietal lobe encephalomalacia, small chronic cerebellar infarcts. Vascular: Calcified atherosclerosis at the skull base. No suspicious intracranial vascular hyperdensity. Skull: Stable previous vertex burr holes. Sinuses/Orbits: Visualized paranasal sinuses and mastoids are stable and well aerated. Other: No acute orbit or scalp soft tissue finding. IMPRESSION: 1. Bilateral, left greater than right mixed density Subdural Hematomas are stable from yesterday. Left side up to 10 mm, and smaller on the Right 4-5 mm. 2. No significant intracranial mass effect. No new intracranial abnormality. 3. Advanced Chronic ischemic disease. Electronically Signed   By: Odessa Fleming M.D.   On: 02/16/2024 13:35   CT HEAD WO CONTRAST ( ) Addendum Date: 02/15/2024 ADDENDUM REPORT: 02/15/2024 18:40 ADDENDUM: These results were called by telephone at the time of interpretation on 02/15/2024 at 6:36 pm to provider Delle Reining, PA, who verbally acknowledged these results. Electronically Signed   By: Emily Filbert M.D.   On: 02/15/2024 18:40   Result Date: 02/15/2024 CLINICAL DATA:  Subdural hematoma, increased confusion and inattention on exam. EXAM: CT HEAD WITHOUT CONTRAST TECHNIQUE: Contiguous axial images were obtained from the base of the skull through the vertex without intravenous contrast. RADIATION DOSE REDUCTION: This exam was performed according to the departmental dose-optimization program which includes automated exposure control, adjustment of the mA and/or kV according to patient size and/or use of iterative reconstruction technique. COMPARISON:  CT head 01/31/2024 FINDINGS: Brain:  Redemonstrated postsurgical changes of both subdural spaces with interval removal of surgical drains. There is a mixed attenuation subdural collection over the left cerebral convexity which measures up to 13 mm in thickness, previously measuring 11 mm. Associated mass effect on the parenchyma is similar to prior. There are additional areas of acute blood products posteriorly over the left parietal lobe measuring up to 5 mm in maximum coronal thickness. Additional blood products over the posterior left occipital lobe adjacent to the transverse sinus measuring up to 8 mm in thickness. Minimal residual subdural collection over the right parietal lobe near the vertex. Approximately 2 mm rightward midline shift is similar to prior. Interval resolution of pneumocephalus. Similar chronic microvascular ischemic changes. 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 Small remote infarcts in the left cerebellum. Generalized parenchymal volume loss. The basilar cisterns are patent. Vascular: Atherosclerosis of the carotid siphons. No hyperdense vessel. Skull: Normal. Negative for fracture or focal lesion. Sinuses/Orbits: Orbits are symmetric. Mild mucosal thickening in the  ethmoid sinuses and left sphenoid sinus. Other: Mastoid air cells are clear. IMPRESSION: Slightly increased mixed attenuation subdural collection over the left cerebral convexity measuring up to 13 mm, previously 11 mm. New focus of acute blood products posteriorly over the left parietal lobe measuring up to 5 mm in thickness. Additional acute blood products over the posterior left occipital lobe measuring up to 8 mm in thickness. Overall similar mass effect with approximately 2 mm rightward midline shift. Chronic ischemic disease as above. Electronically Signed: By: Emily Filbert M.D. On: 02/15/2024 18:19     LOS: 2 days   Jeoffrey Massed, MD  [facility]    To contact the attending provider between 7A-7P or the covering provider during after  hours 7P-7A, please log into the web site [email or web address] and access using universal [place] password for that web site. If you do not have the password, please call the hospital operator.  02/17/2024, 10:01 AM

## 2024-02-17 NOTE — Progress Notes (Signed)
 Inpatient Rehab Admissions Coordinator:   Spoke to pt's daughter, Joice Lofts, on the phone.  She confirms plan is still to d/c home with 24/7 care from her.  She is working with [employer] to get set up as pt's caregiver.  I've requested documentation from PMR physician regarding pt returning to program and will start insurance auth request once I have that and completed therapy notes.   Estill Dooms, PT, DPT Admissions Coordinator [phone] 02/17/24  3:24 PM

## 2024-02-17 NOTE — Evaluation (Signed)
 Physical Therapy Evaluation Patient Details Name: MATRICE HERRO MRN: [id number] DOB: 1956/04/16 Today's Date: 02/17/2024  History of Present Illness  Pt is a 68 y/o F admitted on 01/29/24 with c/o AMS. Pt found to have bilateral SDH & underwent bilateral frontal burr hole for drainage. Pt d/c to inpatient rehab on 02/08/24. Pt noted to have increased confusion on 02/15/24. CT showing worsening SDH & pt transferred to acute setting. Neurosurgery recommending monitoring with no surgical intervention at this time. PMH: dementia, DVT on eliquis, chronic thrombocytopenia, B12 deficiency, HTN, anxiety, collagen vascular disease, COPD, lumbar DDD, depression, DM, fibromyalgia, Graves' disease, HLD, lupus  Clinical Impression  Pt seen for PT evaluation with pt agreeable. Pt is oriented to name, place, & year. 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 Pt follows simple commands throughout session with extra time. Pt is able to complete bed mobility with min assist, STS from various surfaces with mod assist, and ambulate in room to bathroom & back without AD with mod assist. Pt presents with R inattention to body & space. Pt would benefit from ongoing PT services to progress mobility, reduce fall risk & decrease caregiver burden.      If plan is discharge home, recommend the following: Assistance with cooking/housework;Assistance with feeding;Assist for transportation;Help with stairs or ramp for entrance;Supervision due to cognitive status;Direct supervision/assist for medications management;Direct supervision/assist for financial management;A lot of help with bathing/dressing/bathroom;A lot of help with walking and/or transfers   Can travel by private vehicle   Yes    Equipment Recommendations Other (comment) (defer to next venue)  Recommendations for Other Services       Functional Status Assessment  Patient has had a recent decline in their functional status and demonstrates the ability to make significant improvements in function in a reasonable and predictable amount of time.     Precautions / Restrictions Precautions Precautions: Fall Recall of Precautions/Restrictions: Impaired Precaution/Restrictions Comments: ?blind Restrictions Weight Bearing Restrictions Per Provider Order: No      Mobility  Bed Mobility Overal bed mobility: Needs Assistance Bed Mobility: Supine to Sit     Supine to sit: Min assist, HOB elevated, Used rails     General bed mobility comments: assistance to upright trunk, come to sitting EOB    Transfers Overall transfer level: Needs assistance Equipment used: 1 person hand held assist Transfers: Sit to/from Stand Sit to Stand: Min assist, Mod assist           General transfer comment: STS from EOB, recliner, toilet with cuing re: hand placement, assistance to power up, decreased ability to hold to grab bar in bathroom with RUE    Ambulation/Gait Ambulation/Gait assistance: Mod assist Gait Distance (Feet): 20 Feet (20 ft) Assistive device: 1 person hand held assist Gait Pattern/deviations: Decreased step length - left, Decreased step length - right, Decreased stride length, Decreased dorsiflexion - right, Decreased dorsiflexion - left Gait velocity: decreased     General Gait Details: Pt ambulates bed>bathroom>recliner without AD or with 1UE HHA with mod assist with pt demonstrating R lateral lean, decreased step length BLE, decreased stride length.  Stairs            Wheelchair Mobility     Tilt Bed    Modified Rankin (Stroke Patients Only)       Balance Overall balance assessment: Needs assistance Sitting-balance support: Feet supported Sitting balance-Leahy  Scale: Fair Sitting balance - Comments: supervision static sitting EOB or on toilet   Standing balance support: During functional activity, No upper extremity  supported Standing balance-Leahy Scale: Poor                               Pertinent Vitals/Pain Pain Assessment Pain Assessment: Faces Faces Pain Scale: No hurt    Home Living Family/patient expects to be discharged to:: Private residence Living Arrangements: Children;Other relatives Available Help at Discharge: Family;Available PRN/intermittently Type of Home: Mobile home Home Access: Stairs to enter Entrance Stairs-Rails: Right Entrance Stairs-Number of Steps: 6       Additional Comments: information from chart, will need to confirm    Prior Function Prior Level of Function : Needs assist;Patient poor historian/Family not available             Mobility Comments: per chart, pt amb household distances       Extremity/Trunk Assessment   Upper Extremity Assessment Upper Extremity Assessment: Generalized weakness;Difficult to assess due to impaired cognition RUE Deficits / Details: decreased functional use of RUE throughout session, requires cuing to attend to RUE to wash it during hand hygiene    Lower Extremity Assessment Lower Extremity Assessment: Generalized weakness;Difficult to assess due to impaired cognition       Communication   Communication Communication: Impaired Factors Affecting Communication: Reduced clarity of speech    Cognition Arousal: Alert Behavior During Therapy: Flat affect   PT - Cognitive impairments: Orientation, Awareness, Memory, Attention, Initiation, Sequencing, Problem solving, Safety/Judgement   Orientation impairments: Situation (oriented to self but not age, states she's 39 vs 68 y/o)                   PT - Cognition Comments: Unaware of bowel incontinence Following commands: Impaired Following commands impaired: Follows one step commands with increased time     Cueing Cueing Techniques: Verbal cues, Gestural cues, Tactile cues     General Comments General comments (skin integrity, edema, etc.): Pt  with incontinent BM in bed, assisted to toilet to have additional BM. Pt with black, tarry stools & nurse in room & notified. Pt requires total assist for peri hygiene.    Exercises     Assessment/Plan    PT Assessment Patient needs continued PT services  PT Problem List Decreased strength;Decreased range of motion;Decreased activity tolerance;Decreased balance;Decreased mobility;Decreased coordination;Decreased cognition;Decreased knowledge of use of DME;Decreased safety awareness;Decreased knowledge of precautions;Impaired sensation;Cardiopulmonary status limiting activity       PT Treatment Interventions DME instruction;Balance training;Neuromuscular re-education;Gait training;Stair training;Functional mobility training;Therapeutic activities;Therapeutic exercise;Patient/family education;Cognitive remediation;Manual techniques;Modalities    PT Goals (Current goals can be found in the Care Plan section)  Acute Rehab PT Goals Patient Stated Goal: none stated PT Goal Formulation: With patient Time For Goal Achievement: 03/02/24 Potential to Achieve Goals: Fair    Frequency Min 3X/week     Co-evaluation               AM-PAC PT "6 Clicks" Mobility  Outcome Measure Help needed turning from your back to your side while in a flat bed without using bedrails?: A Little Help needed moving from lying on your back to sitting on the side of a flat bed without using bedrails?: A Lot Help needed moving to and from a bed to a chair (including a wheelchair)?: A Lot Help needed standing up from a chair using your arms (e.g., wheelchair or bedside  chair)?: A Lot Help needed to walk in hospital room?: A Lot Help needed climbing 3-5 steps with a railing? : Total 6 Click Score: 12    End of Session   Activity Tolerance: Patient tolerated treatment well Patient left: in chair;with chair alarm set;with call bell/phone within reach;with nursing/sitter in room (set up with meal tray) Nurse  Communication: Mobility status (black tarry stools, pt requires assistance for eating) PT Visit Diagnosis: Unsteadiness on feet (R26.81);Difficulty in walking, not elsewhere classified (R26.2);Other abnormalities of gait and mobility (R26.89);Muscle weakness (generalized) (M62.81);Hemiplegia and hemiparesis Hemiplegia - Right/Left: Right Hemiplegia - dominant/non-dominant: Dominant Hemiplegia - caused by: Nontraumatic intracerebral hemorrhage    Time: 0923-0950 PT Time Calculation (min) (ACUTE ONLY): 27 min   Charges:   PT Evaluation $PT Eval High Complexity: 1 High PT Treatments $Therapeutic Activity: 8-22 mins PT General Charges $$ ACUTE PT VISIT: 1 Visit         Aleda Grana, PT, DPT 02/17/24, 10:04 AM   Sandi Mariscal 02/17/2024, 10:02 AM

## 2024-02-17 NOTE — PMR Pre-admission (Signed)
 PMR Admission Coordinator Pre-Admission Assessment  Patient: Alison Lopez is an 68 y.o., female MRN: [id number] DOB: Nov 25, 1955 Height: 4' 9.01" (144.8 cm) Weight: 49.3 kg  Insurance Information HMO: yes    PPO:      PCP:      IPA:      80/20:      OTHER:  PRIMARY[employer]ine Lopez Medicare HMO      Policy#: [id number]      Subscriber: pt  CM Name: ***      Phone#: [phone] ***      Fax#: [phone] Pre-Cert#: ***      Employer:  Benefits:  Phone #: [phone]     Name:  Alison Lopez. Date: 11/11/23     Deduct: $257 (met)      Out of Pocket Max: [phone]*)      Life Max: n/a CIR: $2185/admit      SNF: 20 full days Outpatient: 80%     Co-Pay: 20% Home Health: 100%      Co-Pay:  DME: 80%     Co-Pay: 20% Providers:  SECONDARY: Medicaid of [place]      Policy#: [id number]     Phone#:   Financial Counselor:       Phone#:   The "Data Collection Information Summary" for patients in Inpatient Rehabilitation Facilities with attached "Privacy Act Statement-Health Care Records" was provided and verbally reviewed with: Patient and Family  Emergency Contact Information Contact Information     Name Relation Home Work Mobile   [place] Daughter   [phone]   Prudencio Burly   [phone]      Other Contacts     Name Relation Home Work Mobile   [place] ([place]op) Clearence Cheek   [phone]       Current Medical History  Patient Admitting Diagnosis:  bilat SDH s/p burr holes   History of Present Illness: Alison Lopez is a 68 year old right-handed female with history of DVT on Eliquis, dementia maintained on Aricept, chronic thrombocytopenia, B12 deficiency, hypertension, diabetes mellitus.  Presented 01/29/2024 to [facility] with altered mental status, right-sided weakness and urinary/fecal incontinence.  Family denied any known recent fall or trauma.  Cranial CT scan positive for bilateral mixed density subdural hematoma somewhat biconvex with a 17 mm maximum  thickness on the left and 14 mm maximum thickness on the right.  Subsequent mass effect of both hemispheres although no significant midline shift.  Basilar cisterns remain patent.  No skull fracture or other acute intracranial abnormality.  Admission chemistries unremarkable except glucose 135, alcohol negative, platelets 52,000.  Underwent bilateral frontal burr hole for drainage of subdural hematoma 01/30/2024 per Dr. Marcell Barlow.  Latest follow-up cranial CT scan 01/31/2024 showed postoperative substantial decrease in bilateral subdural hematomas.  Residual blood up to 11 mm on the left, 4-5 mm on the right.  Significantly regressed intracranial mass effect.   Residual rightward midline shift now 1-2 mm.  She was admitted to CIR on 3/31 and was participating well, making progress towards goals.  On 4/7 she developed increasing confusion and somnolence.  Head CT showed increase mixed attenuation of subdural collection measuring up to 13 mm compared to 11 mm, new focus of blood over the left parietal lobe measuring 5 mm in thickness, and new focus of acute blood over the posterior left occipital lobe up to 9 mm in thickness.  There is 2 mm of rightward shift.  She was admitted back to the acute setting for observation by neurosurgery.  Serial CTs demonstrated  stability and neurosurgery recommended supportive management. Therapy evaluations completed and pt was recommended to return to comprehensive rehab program.  Complete NIHSS TOTAL: 7  Patient's medical record from [facility] has been reviewed by the rehabilitation admission coordinator and physician.  Past Medical History  Past Medical History:  Diagnosis Date   Anxiety    Arthritis    Collagen vascular disease (HCC)    COPD (chronic obstructive pulmonary disease) (HCC)    DDD (degenerative disc disease), lumbar    Dementia (HCC)    Depression    Diabetes mellitus without complication (HCC)    DVT (deep venous thrombosis) (HCC)    Fibromyalgia     GERD (gastroesophageal reflux disease)    Graves' disease with exophthalmos    HLD (hyperlipidemia)    Hypertension    Hypothyroidism    Lupus    Osteoporosis     Has the patient had major surgery during 100 days prior to admission? Yes  Family History   family history includes Heart attack in her father; Stomach cancer in her mother.  Current Medications  Current Facility-Administered Medications:    acetaminophen (TYLENOL) tablet 650 mg, 650 mg, Oral, Q6H PRN **OR** acetaminophen (TYLENOL) suppository 650 mg, 650 mg, Rectal, Q6H PRN, Howerter, Justin B, DO   cyanocobalamin (VITAMIN B12) tablet 1,000 mcg, 1,000 mcg, Oral, Daily, Ghimire, Werner Lean, MD, 1,000 mcg at 02/17/24 1220   donepezil (ARICEPT) tablet 10 mg, 10 mg, Oral, QHS, Ghimire, Werner Lean, MD   ferrous sulfate tablet 325 mg, 325 mg, Oral, Q lunch, Ghimire, Werner Lean, MD, 325 mg at 02/17/24 1220   hydrALAZINE (APRESOLINE) injection 10 mg, 10 mg, Intravenous, Q4H PRN, Howerter, Justin B, DO   insulin aspart (novoLOG) injection 0-6 Units, 0-6 Units, Subcutaneous, TID WC, Howerter, Justin B, DO, 1 Units at 02/17/24 1258   levothyroxine (SYNTHROID) tablet 125 mcg, 125 mcg, Oral, QAC breakfast, Howerter, Justin B, DO, 125 mcg at 02/17/24 0953   melatonin tablet 3 mg, 3 mg, Oral, QHS PRN, Howerter, Justin B, DO   ondansetron (ZOFRAN) injection 4 mg, 4 mg, Intravenous, Q6H PRN, Howerter, Justin B, DO   rosuvastatin (CRESTOR) tablet 20 mg, 20 mg, Oral, Daily, Howerter, Justin B, DO, 20 mg at 02/17/24 0954   sertraline (ZOLOFT) tablet 25 mg, 25 mg, Oral, Daily, Ghimire, Werner Lean, MD, 25 mg at 02/17/24 1220  Patients Current Diet:  Diet Order             Diet regular Room service appropriate? Yes; Fluid consistency: Thin  Diet effective now                   Precautions / Restrictions Precautions Precautions: (P) Fall Precaution/Restrictions Comments: (P) ?blind Restrictions Weight Bearing Restrictions Per  Provider Order: (P) No   Has the patient had 2 or more falls or a fall with injury in the past year? Unknown  Prior Activity Level Household: household ambulatory with rollator, assist for ADLs and iADLs, lives with daughter who drives her to appointments  Prior Functional Level Self Care: Did the patient need help bathing, dressing, using the toilet or eating? Needed some help   Indoor Mobility: Did the patient need assistance with walking from room to room (with or without device)? Independent   Stairs: Did the patient need assistance with internal or external stairs (with or without device)? Needed some help   Functional Cognition: Did the patient need help planning regular tasks such as shopping or remembering to take medications? Needed  some help  Patient Information Are you of Hispanic, Latino/a,or Spanish origin?: A. No, not of Hispanic, Latino/a, or Spanish origin What is your race?: A. White Do you need or want an interpreter to communicate with a doctor or health care staff?: 9. Unable to respond  Patient's Response To:  Health Literacy and Transportation Is the patient able to respond to health literacy and transportation needs?: Yes Health Literacy - How often do you need to have someone help you when you read instructions, pamphlets, or other written material from your doctor or pharmacy?: Often In the past 12 months, has lack of transportation kept you from medical appointments or from getting medications?: No In the past 12 months, has lack of transportation kept you from meetings, work, or from getting things needed for daily living?: No  Journalist, newspaper / Equipment Home Equipment: (P) Shower seat, Agricultural consultant (2 wheels), BSC/3in1  Prior Device Use: Indicate devices/aids used by the patient prior to current illness, exacerbation or injury? Walker  Current Functional Level Cognition  Orientation Level: Oriented to person, Oriented to place, Disoriented to  situation, Disoriented to time    Extremity Assessment (includes Sensation/Coordination)  Upper Extremity Assessment: Generalized weakness, Difficult to assess due to impaired cognition RUE Deficits / Details: decreased functional use of RUE throughout session, requires cuing to attend to RUE to wash it during hand hygiene  Lower Extremity Assessment: Generalized weakness, Difficult to assess due to impaired cognition    ADLs       Mobility  Overal bed mobility: Needs Assistance Bed Mobility: Supine to Sit Supine to sit: Min assist, HOB elevated, Used rails General bed mobility comments: assistance to upright trunk, come to sitting EOB    Transfers  Overall transfer level: Needs assistance Equipment used: 1 person hand held assist Transfers: Sit to/from Stand Sit to Stand: Min assist, Mod assist General transfer comment: STS from EOB, recliner, toilet with cuing re: hand placement, assistance to power up, decreased ability to hold to grab bar in bathroom with RUE    Ambulation / Gait / Stairs / Wheelchair Mobility  Ambulation/Gait Ambulation/Gait assistance: Mod assist Gait Distance (Feet): 20 Feet (20 ft) Assistive device: 1 person hand held assist Gait Pattern/deviations: Decreased step length - left, Decreased step length - right, Decreased stride length, Decreased dorsiflexion - right, Decreased dorsiflexion - left General Gait Details: Pt ambulates bed>bathroom>recliner without AD or with 1UE HHA with mod assist with pt demonstrating R lateral lean, decreased step length BLE, decreased stride length. Gait velocity: decreased    Posture / Balance Dynamic Sitting Balance Sitting balance - Comments: supervision static sitting EOB or on toilet Balance Overall balance assessment: Needs assistance Sitting-balance support: Feet supported Sitting balance-Leahy Scale: Fair Sitting balance - Comments: supervision static sitting EOB or on toilet Standing balance support: During  functional activity, No upper extremity supported Standing balance-Leahy Scale: Poor    Special needs/care consideration Skin burr hole incisions and Behavioral consideration history of mild dementia   Previous Home Environment (from acute therapy documentation) Living Arrangements: (P) Children, Other relatives  Lives With: (P) Daughter Available Help at Discharge: (P) Family, Available PRN/intermittently Type of Home: (P) Mobile home Home Layout: (P) One level Home Access: (P) Stairs to enter Entrance Stairs-Rails: (P) Right Entrance Stairs-Number of Steps: (P) 6 Bathroom Shower/Tub: (P) Tub/shower unit Bathroom Toilet: (P) Standard Home Care Services: No Additional Comments: (P) Pt states she lives with her daughter.  Discharge Living Setting Plans for Discharge Living Setting: Lives with (  comment) (daughter) Type of Home at Discharge: House Discharge Home Layout: One level Discharge Home Access: Stairs to enter Entrance Stairs-Rails: Right Entrance Stairs-Number of Steps: 4 Discharge Bathroom Shower/Tub: Tub/shower unit Discharge Bathroom Toilet: Standard Discharge Bathroom Accessibility: Yes How Accessible: Accessible via walker Does the patient have any problems obtaining your medications?: No  Social/Family/Support Systems Anticipated Caregiver: daughter, Actuary Information: [phone] Ability/Limitations of Caregiver: Joice Lofts is working with [employer] to become pt's caregiver, she will provide 24/7 once pt discharges Caregiver Availability: 24/7 Discharge Plan Discussed with Primary Caregiver: Yes Is Caregiver In Agreement with Plan?: Yes Does Caregiver/Family have Issues with Lodging/Transportation while Pt is in Rehab?: No  Goals Patient/Family Goal for Rehab: PT/OT supervision, SLP supervision to min assist Expected length of stay: 10-14 days Additional Information: Discharge plan: return to patients home where she lives with her  daughter; daughter will provide 24/7 at discharge Pt/Family Agrees to Admission and willing to participate: Yes Program Orientation Provided & Reviewed with Pt/Caregiver Including Roles  & Responsibilities: Yes Additional Information Needs: Daughter has questions for CSW regarding filling out paperwork from [employer] to become pt's caregiver (she already has this paperwork on hand)  Decrease burden of Care through IP rehab admission: n/a   Possible need for SNF placement upon discharge: Not anticipated.  Plan for discharge back to pt's home where she lives with her daughter.  Daughter to provide 24/7 assist.   Patient Condition: This patient's medical and functional status has changed since the consult dated: 02/05/24 in which the Rehabilitation Physician determined and documented that the patient's condition is appropriate for intensive rehabilitative care in an inpatient rehabilitation facility. See "History of Present Illness" (above) for medical update. Functional changes are: pt is currently min to mod assist with mobility, up to 20' x2 reps. Patient's medical and functional status update has been discussed with the Rehabilitation physician and patient remains appropriate for inpatient rehabilitation. Will admit to inpatient rehab pending insurance approval ***  Preadmission Screen Completed By:  Stephania Fragmin, 02/17/2024 3:26 PM ______________________________________________________________________   Discussed status with Dr. Marland Kitchen on *** at *** and received approval for admission today.  Admission Coordinator:  Stephania Fragmin, PT, time Marland KitchenDorna Bloom ***   Assessment/Plan: Diagnosis: *** Does the need for close, 24 hr/day Medical supervision in concert with the patient's rehab needs make it unreasonable for this patient to be served in a less intensive setting? {yes_no_potentially:3041433} Co-Morbidities requiring supervision/potential complications: *** Due to {due UJ:8119147}, does the patient  require 24 hr/day rehab nursing? {yes_no_potentially:3041433} Does the patient require coordinated care of a physician, rehab nurse, PT, OT, and SLP to address physical and functional deficits in the context of the above medical diagnosis(es)? {yes_no_potentially:3041433} Addressing deficits in the following areas: {deficits:3041436} Can the patient actively participate in an intensive therapy program of at least 3 hrs of therapy 5 days a week? {yes_no_potentially:3041433} The potential for patient to make measurable gains while on inpatient rehab is {potential:3041437} Anticipated functional outcomes upon discharge from inpatient rehab: {functional outcomes:304600100} PT, {functional outcomes:304600100} OT, {functional outcomes:304600100} SLP Estimated rehab length of stay to reach the above functional goals is: *** Anticipated discharge destination: {anticipated dc setting:21604} 10. Overall Rehab/Functional Prognosis: {potential:3041437}   MD Signature: ***

## 2024-02-18 DIAGNOSIS — I6200 Nontraumatic subdural hemorrhage, unspecified: Secondary | ICD-10-CM | POA: Diagnosis not present

## 2024-02-18 DIAGNOSIS — S065XAA Traumatic subdural hemorrhage with loss of consciousness status unknown, initial encounter: Secondary | ICD-10-CM | POA: Diagnosis not present

## 2024-02-18 DIAGNOSIS — G9340 Encephalopathy, unspecified: Secondary | ICD-10-CM | POA: Diagnosis not present

## 2024-02-18 DIAGNOSIS — D649 Anemia, unspecified: Secondary | ICD-10-CM | POA: Diagnosis not present

## 2024-02-18 DIAGNOSIS — E039 Hypothyroidism, unspecified: Secondary | ICD-10-CM | POA: Diagnosis not present

## 2024-02-18 LAB — BASIC METABOLIC PANEL WITH GFR
Anion gap: 11 (ref 5–15)
BUN: 15 mg/dL (ref 8–23)
CO2: 23 mmol/L (ref 22–32)
Calcium: 8.8 mg/dL — ABNORMAL LOW (ref 8.9–10.3)
Chloride: 103 mmol/L (ref 98–111)
Creatinine, Ser: 0.82 mg/dL (ref 0.44–1.00)
GFR, Estimated: >60 mL/min (ref >60)
Glucose, Bld: 97 mg/dL (ref 70–99)
Potassium: 4 mmol/L (ref 3.5–5.1)
Sodium: 137 mmol/L (ref 135–145)

## 2024-02-18 LAB — GLUCOSE, CAPILLARY
Glucose-Capillary: 98 mg/dL (ref 70–99)
Glucose-Capillary: 146 mg/dL — ABNORMAL HIGH (ref 70–99)
Glucose-Capillary: 142 mg/dL — ABNORMAL HIGH (ref 70–99)
Glucose-Capillary: 110 mg/dL — ABNORMAL HIGH (ref 70–99)

## 2024-02-18 LAB — MAGNESIUM: Magnesium: 1.9 mg/dL (ref 1.7–2.4)

## 2024-02-18 NOTE — Evaluation (Signed)
 Speech Language Pathology Evaluation Patient Details Name: Alison Lopez MRN: [id number] DOB: 1956/09/24 Today's Date: 02/18/2024 Time: 1345-1405 SLP Time Calculation (min) (ACUTE ONLY): 20 min  Problem List:  Patient Active Problem List   Diagnosis Date Noted   Subdural hematoma (HCC) 02/16/2024   Acute encephalopathy 02/16/2024   Acute anemia 02/16/2024   Acquired hypothyroidism 02/16/2024   SDH (subdural hematoma) (HCC) 02/08/2024   Bilateral subdural hematomas (HCC) 01/29/2024   Compression of brain (HCC) 01/29/2024   Dementia without behavioral disturbance (HCC) 01/29/2024   Antiphospholipid antibody syndrome (HCC) 01/21/2024   Thrombocytopenia (HCC) 01/21/2024   B12 deficiency 01/21/2024   Neovascular glaucoma of right eye 11/28/2019   COPD (chronic obstructive pulmonary disease) (HCC) 06/28/2019   Depression 06/28/2019   Fibromyalgia 06/28/2019   GERD (gastroesophageal reflux disease) 06/28/2019   HLD (hyperlipidemia) 06/28/2019   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 06/28/2019   Rheumatoid arthritis (HCC) 06/28/2019   Diabetes (HCC) 05/17/2019   Essential hypertension 05/17/2019   DVT (deep venous thrombosis) (HCC) 04/21/2019   Right leg pain 03/21/2019   Compression fracture of L2 (HCC) 01/28/2017   Osteoporosis of multiple sites 01/28/2017   Pain in joint, pelvic region and thigh 01/28/2017   Abnormal intentional weight loss 12/16/2016   DDD (degenerative disc disease), lumbar 12/16/2016   Generalized osteoarthritis of hand 12/16/2016   Anticoagulant long-term use 01/31/2015   Episode of dizziness 01/25/2015   History of DVT of lower extremity 01/25/2015   DM2 (diabetes mellitus, type 2) (HCC) 01/25/2015   Past Medical History:  Past Medical History:  Diagnosis Date   Anxiety    Arthritis    Collagen vascular disease (HCC)    COPD (chronic obstructive pulmonary disease) (HCC)    DDD (degenerative disc disease), lumbar    Dementia (HCC)    Depression     Diabetes mellitus without complication (HCC)    DVT (deep venous thrombosis) (HCC)    Fibromyalgia    GERD (gastroesophageal reflux disease)    Graves' disease with exophthalmos    HLD (hyperlipidemia)    Hypertension    Hypothyroidism    Lupus    Osteoporosis    Past Surgical History:  Past Surgical History:  Procedure Laterality Date   ABDOMINAL HYSTERECTOMY     BURR HOLE Bilateral 01/30/2024   Procedure: CREATION, CRANIAL BURR HOLE, SUBDURAL HEMATOMA EVACUATIOIN;  Surgeon: Venetia Night, MD;  Location: [facility] ORS;  Service: Neurosurgery;  Laterality: Bilateral;  need ventriculostomy x 2   COLONOSCOPY WITH PROPOFOL N/A 06/11/2021   Procedure: COLONOSCOPY WITH PROPOFOL;  Surgeon: Regis Bill, MD;  Location: [facility] ENDOSCOPY;  Service: Endoscopy;  Laterality: N/A;  prefers afternoon Eliquis   COLONOSCOPY WITH PROPOFOL N/A 12/19/2022   Procedure: COLONOSCOPY WITH PROPOFOL;  Surgeon: Regis Bill, MD;  Location: [facility] ENDOSCOPY;  Service: Endoscopy;  Laterality: N/A;   ESOPHAGOGASTRODUODENOSCOPY N/A 06/11/2021   Procedure: ESOPHAGOGASTRODUODENOSCOPY (EGD);  Surgeon: Regis Bill, MD;  Location: [facility] ENDOSCOPY;  Service: Endoscopy;  Laterality: N/A;   EYE SURGERY Right    IVC FILTER REMOVAL N/A 07/21/2019   Procedure: IVC FILTER REMOVAL;  Surgeon: Annice Needy, MD;  Location: [facility] INVASIVE CV LAB;  Service: Cardiovascular;  Laterality: N/A;   LOWER EXTREMITY INTERVENTION Right 04/22/2019   Procedure: IVC Filter Insertion with Right Lower Extremity Venous Lysis;  Surgeon: Annice Needy, MD;  Location: [facility] INVASIVE CV LAB;  Service: Cardiovascular;  Laterality: Right;   OOPHORECTOMY     HPI:  Patient is a 68 y.o. female  with PMH: dementia, chronic thrombocytopenia, DM-2, HLD, acquired hypothyroidism, recent hospitalization for SDH s/p burr hole. She was initially admitted at [facility], then transferred into CIR on 02/01/24. She was discharged from CIR to acute care on 02/15/24 with  new SDH after exhibiting more confusion and somnolence. Neurosurgery consulted who felt that new [facility] were small and did not warrant surgical intervention.   Assessment / Plan / Recommendation Clinical Impression  Patient presents with moderately impaired cognitive-linguistic impairment as per this evaluation. She was awake, alert but lethargic and responses and actions were delayed. When speaking, speech was slow with decreased intelligibilty at phrase and sentence level when context not known.  She was oriented to place, basic situation: "I had blood on my brain, they had to operate on it", month and day of week but not year, month or date. Twice she told SLP she was waiting for her daughter to come visit, even after SLP explained that daughter was likely at work. Voice was fairly monotone but she interacted and engaged in simple level conversation with SLP well, but with delays in responses. SLP will follow patient while admitted and recommending continued SLP services at next venue of care.    SLP Assessment  SLP Recommendation/Assessment: Patient needs continued Speech Lanaguage Pathology Services SLP Visit Diagnosis: Cognitive communication deficit (R41.841);Dysarthria and anarthria (R47.1)    Recommendations for follow up therapy are one component of a multi-disciplinary discharge planning process, led by the attending physician.  Recommendations may be updated based on patient status, additional functional criteria and insurance authorization.    Follow Up Recommendations  Other (comment) (TBD: CIR versus SNF)    Assistance Recommended at Discharge  Frequent or constant Supervision/Assistance  Functional Status Assessment Patient has had a recent decline in their functional status and demonstrates the ability to make significant improvements in function in a reasonable and predictable amount of time.  Frequency and Duration min 2x/week  1 week      SLP Evaluation Cognition  Overall  Cognitive Status: Difficult to assess Arousal/Alertness: Awake/alert Orientation Level: Oriented to person;Oriented to place;Disoriented to place;Oriented to situation Year: Other (Comment) (I dont know) Month: October Attention: Sustained Focused Attention: Appears intact Sustained Attention: Impaired Sustained Attention Impairment: Verbal complex;Verbal basic Memory: Impaired Memory Impairment: Decreased recall of new information Awareness: Impaired Awareness Impairment: Intellectual impairment;Emergent impairment Problem Solving Impairment: Verbal basic;Functional basic       Comprehension  Auditory Comprehension Overall Auditory Comprehension: Impaired Conversation: Simple Interfering Components: Hearing;Visual impairments EffectiveTechniques: Extra processing time;Repetition;Increased volume Reading Comprehension Reading Status: Unable to assess (comment) (h/o visual deficits)    Expression Expression Primary Mode of Expression: Verbal Verbal Expression Overall Verbal Expression: Impaired Initiation: Impaired Level of Generative/Spontaneous Verbalization: Phrase;Sentence Pragmatics: No impairment Effective Techniques: Semantic cues;Open ended questions Non-Verbal Means of Communication: Not applicable Written Expression Dominant Hand: Right Written Expression: Not tested   Oral / Motor  Oral Motor/Sensory Function Overall Oral Motor/Sensory Function: Mild impairment Facial ROM: Reduced right Facial Symmetry: Abnormal symmetry right Facial Strength: Reduced right Motor Speech Respiration: Within functional limits Phonation: Normal Resonance: Within functional limits Articulation: Impaired Level of Impairment: Sentence Intelligibility: Intelligibility reduced Word: 75-100% accurate Phrase: 75-100% accurate Sentence: 75-100% accurate Conversation: 75-100% accurate            Angela Nevin, MA, CCC-SLP Speech Therapy

## 2024-02-18 NOTE — Care Management Important Message (Signed)
 Important Message  Patient Details  Name: Alison Lopez MRN: [id number] Date of Birth: 1955/11/29   Important Message Given:  Yes - Medicare IM     Dorena Bodo 02/18/2024, 2:27 PM

## 2024-02-18 NOTE — Progress Notes (Signed)
 Physical Therapy Discharge Note  This patient was unable to complete the inpatient rehab program due to returning to acute care for medical needs; therefore did not meet their long term goals. Pt left the program at a minA to modA assist level for their functional mobility/ transfers. This patient is being discharged from PT services at this time.  Pt's perception of pain in the last five days was unable to answer at this time.   See CareTool for functional status details  If the patient is able to return to inpatient rehabilitation within 3 midnights, this may be considered an interrupted stay and therapy services will resume as ordered. Modification and reinstatement of their goals will be made upon completion of therapy service reevaluations.   Barbie Banner, PT, DPT

## 2024-02-18 NOTE — Progress Notes (Signed)
 Hospitalist was made aware of the following: Patient had 4 beats of V. tach. she is resting and stable currently   STAT  labs were ordered in response.

## 2024-02-18 NOTE — Progress Notes (Signed)
 Inpatient Rehab Admissions Coordinator:   Auth for CIR pending.  We will continue to follow.   Estill Dooms, PT, DPT Admissions Coordinator [phone] 02/18/24  12:48 PM

## 2024-02-18 NOTE — Plan of Care (Signed)
 progressing

## 2024-02-18 NOTE — Progress Notes (Signed)
 Speech Therapy Discharge Note  This patient was unable to complete the inpatient rehab program due to medical complications ; therefore, the patient did not meet their long term goals and has been discharged from skilled SLP services at this time.The patient left the program at a mod-maxA assist level for overall cognitive functioning. The patient is currently consuming regular textures with thin liquids.  See CareTool for functional status details.  If the patient is able to return to inpatient rehabilitation within 3 midnights, this may be considered an interrupted stay and therapy services will resume as ordered. Modification and reinstatement of their goals will be made upon completion of therapy service reevaluations.

## 2024-02-18 NOTE — Progress Notes (Signed)
 PROGRESS NOTE        PATIENT DETAILS Name: Alison Lopez Age: 68 y.o. Sex: female Date of Birth: [birth year] Admit Date: 02/15/2024 Admitting Physician Angie Fava, DO ZOX:[id number], Marylin Crosby, MD  Brief Summary: Patient is a 68 y.o.  female with history of antiphospholipid syndrome/chronic thrombocytopenia-was recently hospitalized at [facility] from 3/21-3/31 for SDH requiring bur hole surgery-was subsequently transferred to CIR-admitted back to [facility] service on 4/7 for worsening confusion-CT head shows interval slight worsening of SDH.  Subsequent serial CT head stable-Case was discussed with patient's primary neurosurgeon-recommendations are to continue with supportive care-nonoperative management-and to repeat CT head in 2 weeks.    Significant events: 4/7>> admit to [facility] from [facility]-worsening confusion-interval worsening of SDH on CT head.  Significant studies: 4/7>> CT head: SDH-left cerebral convexity-measuring 13 mm-previously 11 mm.  New focus of blood in left parietal lobe-5 mm in thickness, acute blood products over left occipital lobe-8 mm intact mass. 4/8>> CT head: Unchanged hematomas compared to 4/7.  Significant microbiology data: None  Procedures: None  Consults: Phone consult-neurosurgery-Dr. Marcell Barlow.  Subjective: No major issues overnight-awake/alert-answer simple questions appropriately.  Objective: Vitals: Blood pressure 120/76, pulse 72, temperature 98.9 F (37.2 C), temperature source Oral, resp. rate 14, height 4' 9.01" (1.448 m), weight 48.1 kg, SpO2 99%.   Exam: Gen Exam:Alert awake-not in any distress HEENT:atraumatic, normocephalic Chest: B/L clear to auscultation anteriorly CVS:S1S2 regular Abdomen:soft non tender, non distended Extremities:no edema Neurology: Non focal Skin: no rash  Pertinent Labs/Radiology:    Latest Ref Rng & Units 02/17/2024    4:28 AM 02/16/2024    4:36 AM 02/15/2024    5:37 AM  CBC  WBC 4.0 -  10.5 K/uL 6.5  7.2  7.4   Hemoglobin 12.0 - 15.0 g/dL 40.9  9.7  9.5   Hematocrit 36.0 - 46.0 % 31.3  28.4  27.4   Platelets 150 - 400 K/uL 74  63  54     Lab Results  Component Value Date   NA 137 02/18/2024   K 4.0 02/18/2024   CL 103 02/18/2024   CO2 23 02/18/2024     Assessment/Plan: SDH Awake/alert-nonfocal exam Continue supportive care-neurosurgery Dr. Marcell Barlow recommending repeat CT head in 2 weeks Holding all therapeutic/prophylactic anticoagulation until then  Acute metabolic encephalopathy/confusion Unclear etiology-confused at CIR but has been awake/alert for the past 2 days without any major issues. Suspect given history of dementia-this may be delirium. Since improved with just supportive measures-doubt further workup is required Maintain delirium precautions.   Vitamin B12 deficiency Continue supplementation  Normocytic anemia Hb stable No evidence of GI bleeding-watch closely  Hypothyroidism Synthroid TSH 4/8 stable.  HLD Statin  DM-2 (A1c 4.8 on 3/22) CBG stable-SSI  Recent Labs    02/17/24 1605 02/17/24 2115 02/18/24 0805  GLUCAP 146* 95 98    HLD Statin  Antiphospholipid syndrome with history of DVT Thrombocytopenia secondary to antiphospholipid syndrome Hematology following-appreciate note on 4/8-holding anticoagulation for at least 2 weeks given SDH and concern for worsening. SCD's/frequent mobilization  Dementia Continue Aricept Delirium precautions  Mood disorder Continue Zoloft  BMI: Estimated body mass index is 22.94 kg/m as calculated from the following:   Height as of this encounter: 4' 9.01" (1.448 m).   Weight as of this encounter: 48.1 kg.   Code status:   Code Status: Full Code  DVT Prophylaxis: SCDs Start: 02/16/24 0042   Family Communication: Daughter-Amber-[phone] -updated over the phone 4/10   Disposition Plan: Status is: Inpatient Remains inpatient appropriate because: Severity of illness    Planned Discharge Destination:Rehabilitation facility   Diet: Diet Order             Diet regular Room service appropriate? Yes; Fluid consistency: Thin  Diet effective now                     Antimicrobial agents: Anti-infectives (From admission, onward)    None        MEDICATIONS: Scheduled Meds:  cyanocobalamin  1,000 mcg Oral Daily   donepezil  10 mg Oral QHS   ferrous sulfate  325 mg Oral Q lunch   insulin aspart  0-6 Units Subcutaneous TID WC   levothyroxine  125 mcg Oral QAC breakfast   melatonin  5 mg Oral QHS   rosuvastatin  20 mg Oral Daily   sertraline  25 mg Oral Daily   Continuous Infusions: PRN Meds:.acetaminophen **OR** acetaminophen, hydrALAZINE, ondansetron (ZOFRAN) IV   I have personally reviewed following labs and imaging studies  LABORATORY DATA: CBC: Recent Labs  Lab 02/12/24 2240 02/13/24 1112 02/15/24 0537 02/16/24 0436 02/17/24 0428  WBC 10.8* 9.6 7.4 7.2 6.5  NEUTROABS  --   --  5.5 4.8  --   HGB 9.5* 10.9* 9.5* 9.7* 10.7*  HCT 27.7* 32.4* 27.4* 28.4* 31.3*  MCV 91.1 91.3 89.0 90.2 89.2  PLT 50* 50* 54* 63* 74*    Basic Metabolic Panel: Recent Labs  Lab 02/13/24 1112 02/15/24 0537 02/16/24 0436 02/17/24 0428 02/18/24 0730  NA 136 135 134* 137 137  K 4.3 4.0 4.2 4.3 4.0  CL 102 104 103 105 103  CO2 24 23 21* 20* 23  GLUCOSE 153* 104* 106* 100* 97  BUN 14 13 14 16 15   CREATININE 1.04* 0.94 0.96 0.78 0.82  CALCIUM 8.9 8.5* 8.6* 8.7* 8.8*  MG  --   --  2.0  --  1.9    GFR: Estimated Creatinine Clearance: 43.3 mL/min (by C-G formula based on SCr of 0.82 mg/dL).  Liver Function Tests: Recent Labs  Lab 02/12/24 2240 02/16/24 0436  AST 20 19  ALT 20 25  ALKPHOS 47 59  BILITOT 0.5 0.3  PROT 5.6* 5.7*  ALBUMIN 2.6* 2.5*   No results for input(s): "LIPASE", "AMYLASE" in the last 168 hours. No results for input(s): "AMMONIA" in the last 168 hours.  Coagulation Profile: Recent Labs  Lab 02/16/24 0719   INR 1.1    Cardiac Enzymes: Recent Labs  Lab 02/16/24 0436  CKTOTAL 17*    BNP (last 3 results) No results for input(s): "PROBNP" in the last 8760 hours.  Lipid Profile: No results for input(s): "CHOL", "HDL", "LDLCALC", "TRIG", "CHOLHDL", "LDLDIRECT" in the last 72 hours.  Thyroid Function Tests: Recent Labs    02/16/24 0436  TSH 0.465    Anemia Panel: No results for input(s): "VITAMINB12", "FOLATE", "FERRITIN", "TIBC", "IRON", "RETICCTPCT" in the last 72 hours.   Urine analysis:    Component Value Date/Time   COLORURINE YELLOW 02/12/2024 2219   APPEARANCEUR HAZY (A) 02/12/2024 2219   LABSPEC 1.019 02/12/2024 2219   PHURINE 6.0 02/12/2024 2219   GLUCOSEU NEGATIVE 02/12/2024 2219   HGBUR NEGATIVE 02/12/2024 2219   BILIRUBINUR NEGATIVE 02/12/2024 2219   KETONESUR NEGATIVE 02/12/2024 2219   PROTEINUR NEGATIVE 02/12/2024 2219   NITRITE NEGATIVE 02/12/2024 2219   LEUKOCYTESUR  NEGATIVE 02/12/2024 2219    Sepsis Labs: Lactic Acid, Venous    Component Value Date/Time   LATICACIDVEN 0.8 02/13/2024 0641    MICROBIOLOGY: No results found for this or any previous visit (from the past 240 hours).  RADIOLOGY STUDIES/RESULTS: CT HEAD WO CONTRAST ( ) Result Date: 02/16/2024 CLINICAL DATA:  69 year old female with left subdural hematoma. EXAM: CT HEAD WITHOUT CONTRAST TECHNIQUE: Contiguous axial images were obtained from the base of the skull through the vertex without intravenous contrast. RADIATION DOSE REDUCTION: This exam was performed according to the departmental dose-optimization program which includes automated exposure control, adjustment of the mA and/or kV according to patient size and/or use of iterative reconstruction technique. COMPARISON:  Head CT yesterday and earlier. FINDINGS: Brain: Mixed density left side subdural hematoma redemonstrated. Thickness is 9-10 mm on coronal images and not changed from yesterday. Minority of hyperdense blood products not  significantly changed. Trace subdural space gas also again noted on coronal image 15. Smaller mixed density right side subdural hematoma which is primarily limited to the posterior convexity is 4-5 mm, stable on coronal image 23. No significant midline shift. No significant mass effect on the ventricles. Basilar cisterns remain patent. No ventriculomegaly. No new intracranial hemorrhage identified. Stable gray-white matter differentiation throughout the brain. Extensive chronic occipital and parietal lobe encephalomalacia, small chronic cerebellar infarcts. Vascular: Calcified atherosclerosis at the skull base. No suspicious intracranial vascular hyperdensity. Skull: Stable previous vertex burr holes. Sinuses/Orbits: Visualized paranasal sinuses and mastoids are stable and well aerated. Other: No acute orbit or scalp soft tissue finding. IMPRESSION: 1. Bilateral, left greater than right mixed density Subdural Hematomas are stable from yesterday. Left side up to 10 mm, and smaller on the Right 4-5 mm. 2. No significant intracranial mass effect. No new intracranial abnormality. 3. Advanced Chronic ischemic disease. Electronically Signed   By: Odessa Fleming M.D.   On: 02/16/2024 13:35     LOS: 3 days   Jeoffrey Massed, MD  [facility]    To contact the attending provider between 7A-7P or the covering provider during after hours 7P-7A, please log into the web site [email or web address] and access using universal [place] password for that web site. If you do not have the password, please call the hospital operator.  02/18/2024, 10:26 AM

## 2024-02-18 NOTE — Progress Notes (Signed)
 PT Cancellation Note  Patient Details Name: Alison Lopez MRN: [id number] DOB: 1956-05-20   Cancelled Treatment:    Reason Eval/Treat Not Completed: (P) Other (comment) (pt working with SLP. Did not have time to reattempt.) Will continue efforts next date per PT plan of care as schedule permits.   Dorathy Kinsman Marquest Gunkel 02/18/2024, 6:47 PM

## 2024-02-18 NOTE — Consult Note (Signed)
 Cathey Fredenburg is a 68 year old female with history of antiphospholipid antibody syndrome and DVT on Eliquis , chronic thrombocytopenia, cognitive decline on Aricept, diabetes, hypertension, diabetic retinopathy with blindness, and B12 deficiency who presented to inpatient rehab on 3-31 after hospitalization for spontaneous bilateral SDH with resulting burr hole 3-22 by Dr. Marcell Barlow.  She was found to have significant functional decline and right homonymous hemianopsia, and was making gains/participating in inpatient rehab prior to being sent off the unit for close neurologic monitoring on 4-7 after CT head for increased confusion revealed worsening and new SDH's.  Repeat CT head on 4-8 was unchanged, oncology and neurosurgery were consulted and given patient's ongoing thrombocytopenia and recurrent bleeds, decided to continue holding anticoagulation for an additional 2 weeks and continue with conservative management.  She is currently participating in therapies on acute, able to complete ADLs at a min to mod assist level, complete bed mobility with min assist, mod assist sit to stand, ambulation without assistive device and at a mod assist level.  She continues to be complicated by right inattention and weakness, and would benefit from returning to inpatient rehab to complete intensive therapies in pursuit of returning home with her children with 24/7 assistance.   Angelina Sheriff, DO 02/18/2024

## 2024-02-19 ENCOUNTER — Other Ambulatory Visit: Payer: Self-pay

## 2024-02-19 ENCOUNTER — Inpatient Hospital Stay (HOSPITAL_COMMUNITY)
Admission: AD | Admit: 2024-02-19 | Discharge: 2024-03-04 | DRG: 057 | Disposition: A | Discharge status: Home Health | Source: Intra-hospital | Attending: Physical Medicine and Rehabilitation | Admitting: Physical Medicine and Rehabilitation

## 2024-02-19 ENCOUNTER — Encounter (HOSPITAL_COMMUNITY): Payer: Self-pay | Admitting: Physical Medicine and Rehabilitation

## 2024-02-19 DIAGNOSIS — N390 Urinary tract infection, site not specified: Secondary | ICD-10-CM | POA: Diagnosis not present

## 2024-02-19 DIAGNOSIS — E785 Hyperlipidemia, unspecified: Secondary | ICD-10-CM | POA: Diagnosis present

## 2024-02-19 DIAGNOSIS — Z8249 Family history of ischemic heart disease and other diseases of the circulatory system: Secondary | ICD-10-CM

## 2024-02-19 DIAGNOSIS — J449 Chronic obstructive pulmonary disease, unspecified: Secondary | ICD-10-CM | POA: Diagnosis present

## 2024-02-19 DIAGNOSIS — I10 Essential (primary) hypertension: Secondary | ICD-10-CM | POA: Diagnosis present

## 2024-02-19 DIAGNOSIS — M810 Age-related osteoporosis without current pathological fracture: Secondary | ICD-10-CM | POA: Diagnosis present

## 2024-02-19 DIAGNOSIS — I69818 Other symptoms and signs involving cognitive functions following other cerebrovascular disease: Secondary | ICD-10-CM | POA: Diagnosis present

## 2024-02-19 DIAGNOSIS — F32A Depression, unspecified: Secondary | ICD-10-CM | POA: Diagnosis present

## 2024-02-19 DIAGNOSIS — N3941 Urge incontinence: Secondary | ICD-10-CM | POA: Diagnosis present

## 2024-02-19 DIAGNOSIS — I69851 Hemiplegia and hemiparesis following other cerebrovascular disease affecting right dominant side: Secondary | ICD-10-CM | POA: Diagnosis not present

## 2024-02-19 DIAGNOSIS — R001 Bradycardia, unspecified: Secondary | ICD-10-CM | POA: Diagnosis present

## 2024-02-19 DIAGNOSIS — E039 Hypothyroidism, unspecified: Secondary | ICD-10-CM | POA: Diagnosis present

## 2024-02-19 DIAGNOSIS — F419 Anxiety disorder, unspecified: Secondary | ICD-10-CM | POA: Diagnosis present

## 2024-02-19 DIAGNOSIS — Z888 Allergy status to other drugs, medicaments and biological substances status: Secondary | ICD-10-CM

## 2024-02-19 DIAGNOSIS — S065XAA Traumatic subdural hemorrhage with loss of consciousness status unknown, initial encounter: Secondary | ICD-10-CM | POA: Diagnosis not present

## 2024-02-19 DIAGNOSIS — R5383 Other fatigue: Secondary | ICD-10-CM | POA: Diagnosis not present

## 2024-02-19 DIAGNOSIS — M797 Fibromyalgia: Secondary | ICD-10-CM | POA: Diagnosis present

## 2024-02-19 DIAGNOSIS — Z79890 Hormone replacement therapy (postmenopausal): Secondary | ICD-10-CM

## 2024-02-19 DIAGNOSIS — Z87891 Personal history of nicotine dependence: Secondary | ICD-10-CM

## 2024-02-19 DIAGNOSIS — K219 Gastro-esophageal reflux disease without esophagitis: Secondary | ICD-10-CM | POA: Diagnosis present

## 2024-02-19 DIAGNOSIS — D6861 Antiphospholipid syndrome: Secondary | ICD-10-CM | POA: Diagnosis present

## 2024-02-19 DIAGNOSIS — R471 Dysarthria and anarthria: Secondary | ICD-10-CM | POA: Diagnosis present

## 2024-02-19 DIAGNOSIS — B9620 Unspecified Escherichia coli [E. coli] as the cause of diseases classified elsewhere: Secondary | ICD-10-CM | POA: Diagnosis not present

## 2024-02-19 DIAGNOSIS — E11319 Type 2 diabetes mellitus with unspecified diabetic retinopathy without macular edema: Secondary | ICD-10-CM | POA: Diagnosis present

## 2024-02-19 DIAGNOSIS — Z90710 Acquired absence of both cervix and uterus: Secondary | ICD-10-CM

## 2024-02-19 DIAGNOSIS — Z86718 Personal history of other venous thrombosis and embolism: Secondary | ICD-10-CM | POA: Diagnosis not present

## 2024-02-19 DIAGNOSIS — D649 Anemia, unspecified: Secondary | ICD-10-CM | POA: Diagnosis present

## 2024-02-19 DIAGNOSIS — E538 Deficiency of other specified B group vitamins: Secondary | ICD-10-CM | POA: Diagnosis present

## 2024-02-19 DIAGNOSIS — R270 Ataxia, unspecified: Secondary | ICD-10-CM | POA: Diagnosis present

## 2024-02-19 DIAGNOSIS — R7989 Other specified abnormal findings of blood chemistry: Secondary | ICD-10-CM | POA: Diagnosis present

## 2024-02-19 DIAGNOSIS — F0390 Unspecified dementia without behavioral disturbance: Secondary | ICD-10-CM | POA: Diagnosis present

## 2024-02-19 DIAGNOSIS — Z79899 Other long term (current) drug therapy: Secondary | ICD-10-CM | POA: Diagnosis not present

## 2024-02-19 DIAGNOSIS — M1990 Unspecified osteoarthritis, unspecified site: Secondary | ICD-10-CM | POA: Diagnosis present

## 2024-02-19 DIAGNOSIS — D6959 Other secondary thrombocytopenia: Secondary | ICD-10-CM | POA: Diagnosis present

## 2024-02-19 DIAGNOSIS — R070 Pain in throat: Secondary | ICD-10-CM | POA: Diagnosis not present

## 2024-02-19 DIAGNOSIS — R1310 Dysphagia, unspecified: Secondary | ICD-10-CM | POA: Diagnosis present

## 2024-02-19 DIAGNOSIS — D696 Thrombocytopenia, unspecified: Secondary | ICD-10-CM | POA: Diagnosis present

## 2024-02-19 DIAGNOSIS — I6200 Nontraumatic subdural hemorrhage, unspecified: Secondary | ICD-10-CM | POA: Diagnosis not present

## 2024-02-19 DIAGNOSIS — Z886 Allergy status to analgesic agent status: Secondary | ICD-10-CM

## 2024-02-19 DIAGNOSIS — H548 Legal blindness, as defined in USA: Secondary | ICD-10-CM | POA: Diagnosis present

## 2024-02-19 DIAGNOSIS — Z885 Allergy status to narcotic agent status: Secondary | ICD-10-CM

## 2024-02-19 DIAGNOSIS — G9340 Encephalopathy, unspecified: Secondary | ICD-10-CM | POA: Diagnosis not present

## 2024-02-19 DIAGNOSIS — I959 Hypotension, unspecified: Secondary | ICD-10-CM | POA: Diagnosis present

## 2024-02-19 DIAGNOSIS — D62 Acute posthemorrhagic anemia: Secondary | ICD-10-CM | POA: Diagnosis not present

## 2024-02-19 LAB — GLUCOSE, CAPILLARY
Glucose-Capillary: 103 mg/dL — ABNORMAL HIGH (ref 70–99)
Glucose-Capillary: 130 mg/dL — ABNORMAL HIGH (ref 70–99)
Glucose-Capillary: 110 mg/dL — ABNORMAL HIGH (ref 70–99)
Glucose-Capillary: 96 mg/dL (ref 70–99)

## 2024-02-19 MED ORDER — ACETAMINOPHEN 325 MG PO TABS
650.0000 mg | ORAL_TABLET | Freq: Four times a day (QID) | ORAL | Status: DC | PRN
  Administered 2024-03-04: 650 mg via ORAL
  Filled 2024-02-19: qty 2

## 2024-02-19 MED ORDER — MELATONIN 5 MG PO TABS
5.0000 mg | ORAL_TABLET | Freq: Every day | ORAL | Status: DC
  Administered 2024-02-19 – 2024-03-03 (×14): 5 mg via ORAL
  Filled 2024-02-19 (×14): qty 1

## 2024-02-19 MED ORDER — VITAMIN B-12 1000 MCG PO TABS
1000.0000 ug | ORAL_TABLET | Freq: Every day | ORAL | Status: DC
  Administered 2024-02-20 – 2024-03-04 (×14): 1000 ug via ORAL
  Filled 2024-02-19 (×14): qty 1

## 2024-02-19 MED ORDER — INSULIN ASPART 100 UNIT/ML IJ SOLN
0.0000 [IU] | Freq: Three times a day (TID) | INTRAMUSCULAR | Status: DC
  Administered 2024-02-20 – 2024-03-03 (×3): 1 [IU] via SUBCUTANEOUS

## 2024-02-19 MED ORDER — ACETAMINOPHEN 650 MG RE SUPP: 650.0000 mg | Freq: Four times a day (QID) | RECTAL | Status: DC | PRN

## 2024-02-19 MED ORDER — LEVOTHYROXINE SODIUM 25 MCG PO TABS
125.0000 ug | ORAL_TABLET | Freq: Every day | ORAL | Status: DC
  Administered 2024-02-20 – 2024-03-04 (×14): 125 ug via ORAL
  Filled 2024-02-19 (×14): qty 1

## 2024-02-19 MED ORDER — FERROUS SULFATE 325 (65 FE) MG PO TABS
325.0000 mg | ORAL_TABLET | Freq: Every day | ORAL | Status: DC
  Administered 2024-02-20 – 2024-03-04 (×14): 325 mg via ORAL
  Filled 2024-02-19 (×14): qty 1

## 2024-02-19 MED ORDER — ROSUVASTATIN CALCIUM 20 MG PO TABS
20.0000 mg | ORAL_TABLET | Freq: Every day | ORAL | Status: DC
  Administered 2024-02-20 – 2024-03-04 (×14): 20 mg via ORAL
  Filled 2024-02-19 (×14): qty 1

## 2024-02-19 MED ORDER — DONEPEZIL HCL 10 MG PO TABS
10.0000 mg | ORAL_TABLET | Freq: Every day | ORAL | Status: DC
  Administered 2024-02-19 – 2024-03-03 (×14): 10 mg via ORAL
  Filled 2024-02-19 (×14): qty 1

## 2024-02-19 MED ORDER — MELATONIN 5 MG PO TABS: 5.0000 mg | ORAL_TABLET | Freq: Every day | ORAL | Status: DC

## 2024-02-19 MED ORDER — SERTRALINE HCL 50 MG PO TABS
25.0000 mg | ORAL_TABLET | Freq: Every day | ORAL | Status: DC
  Administered 2024-02-20 – 2024-03-04 (×14): 25 mg via ORAL
  Filled 2024-02-19 (×14): qty 1

## 2024-02-19 NOTE — H&P (Signed)
 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  Per chart review she lives with her children.  4 steps to entry of home.  Ambulates household distances without assistive device.  Presented 01/29/2024 to [facility] with altered mental status, right side weakness and urinary/fecal incontinence.  Family denied any recent fall or trauma.  Cranial CT scan positive for bilateral mixed density subdural hematoma somewhat biconvex with a 17 mm maximum thickness on the left and 14 mm maximum thickness on the right.  Subsequent mass effect on both hemispheres although no significant midline shift.  Basilar cisterns remain patent.  No skull fracture or other acute intracranial abnormality.  Admission chemistries unremarkable except glucose 135, alcohol negative, platelets 52,000.  Underwent bilateral frontal burr hole for drainage of subdural hematoma 01/30/2024 per Dr. Marcell Barlow.  Latest follow-up cranial CT scan 01/31/2024 showed postoperative substantial decrease in bilateral subdural hematomas.  Residual blood up to 11 mm on the left, 4-5 mm on the right.  Significantly regressed intracranial mass effect.  Residual rightward midline shift was now 1-2 mm.  Therapy evaluations completed and patient was admitted to inpatient rehab services 02/08/2024.  Patient with slow progressive gains while on rehab services.  Noted on 02/15/2024 patient with increasing altered mental status with cranial CT scan completed showing slightly increased mixed attenuation subdural collection over the left cerebral convexity measuring up to 13 mm, previously 11 mm.  New focus of  acute blood products posteriorly over the left parietal lobe measuring up to 5 mm in thickness.  Additional acute blood products of the posterior left occipital lobe measuring up to 8 mm in thickness.  Dr. Myer Haff of neurosurgery consulted in regards and scans reviewed patient was discharged to acute care services for ongoing monitoring and supportive care nonoperative 02/16/2024 with repeat scan completed again showing bilateral left greater than right mixed density subdural hematoma stable from previous scan left side of 10 mm and smaller on the right 4-5 mm.  No significant intracranial mass effect.  No new intracranial abnormality.  Hematology continue to follow for antiphospholipid syndrome history of DVT thrombocytopenia and recommends holding anticoagulation for at least 2 weeks given SDH.  Therapies have been resumed and monitored.  Patient is admitted back to inpatient rehab services to resume comprehensive rehab therapies.   Review of Systems  Constitutional:  Negative for chills and fever.  HENT:  Negative for hearing loss.   Eyes:  Negative for blurred vision and double vision.  Respiratory:  Negative for cough, shortness of breath and wheezing.   Cardiovascular:  Negative for chest pain and palpitations.  Gastrointestinal:  Positive for constipation. Negative for heartburn and nausea.       GERD  Genitourinary:  Negative for dysuria, flank pain and hematuria.  Musculoskeletal:  Positive for joint pain.  Skin:  Negative for rash.  Neurological:  Positive for weakness.  Psychiatric/Behavioral:  Positive for depression and memory loss. The patient has insomnia.        Anxiety  All other systems reviewed and are negative.       Past Medical History:  Diagnosis Date   Anxiety     Arthritis     Collagen vascular disease (HCC)  COPD (chronic obstructive pulmonary disease) (HCC)     DDD (degenerative disc disease), lumbar     Dementia (HCC)     Depression     Diabetes mellitus  without complication (HCC)     DVT (deep venous thrombosis) (HCC)     Fibromyalgia     GERD (gastroesophageal reflux disease)     Graves' disease with exophthalmos     HLD (hyperlipidemia)     Hypertension     Hypothyroidism     Lupus     Osteoporosis               Past Surgical History:  Procedure Laterality Date   ABDOMINAL HYSTERECTOMY       BURR HOLE Bilateral 01/30/2024    Procedure: CREATION, CRANIAL BURR HOLE, SUBDURAL HEMATOMA EVACUATIOIN;  Surgeon: Venetia Night, MD;  Location: [facility] ORS;  Service: Neurosurgery;  Laterality: Bilateral;  need ventriculostomy x 2   COLONOSCOPY WITH PROPOFOL N/A 06/11/2021    Procedure: COLONOSCOPY WITH PROPOFOL;  Surgeon: Regis Bill, MD;  Location: [facility] ENDOSCOPY;  Service: Endoscopy;  Laterality: N/A;  prefers afternoon Eliquis   COLONOSCOPY WITH PROPOFOL N/A 12/19/2022    Procedure: COLONOSCOPY WITH PROPOFOL;  Surgeon: Regis Bill, MD;  Location: [facility] ENDOSCOPY;  Service: Endoscopy;  Laterality: N/A;   ESOPHAGOGASTRODUODENOSCOPY N/A 06/11/2021    Procedure: ESOPHAGOGASTRODUODENOSCOPY (EGD);  Surgeon: Regis Bill, MD;  Location: [facility] ENDOSCOPY;  Service: Endoscopy;  Laterality: N/A;   EYE SURGERY Right     IVC FILTER REMOVAL N/A 07/21/2019    Procedure: IVC FILTER REMOVAL;  Surgeon: Annice Needy, MD;  Location: [facility] INVASIVE CV LAB;  Service: Cardiovascular;  Laterality: N/A;   LOWER EXTREMITY INTERVENTION Right 04/22/2019    Procedure: IVC Filter Insertion with Right Lower Extremity Venous Lysis;  Surgeon: Annice Needy, MD;  Location: [facility] INVASIVE CV LAB;  Service: Cardiovascular;  Laterality: Right;   OOPHORECTOMY                 Family History  Problem Relation Age of Onset   Stomach cancer Mother     Heart attack Father     Breast cancer Neg Hx          Social History:  reports that she quit smoking about 23 years ago. Her smoking use included cigarettes. She started smoking about 40 years ago. She has a  17 pack-year smoking history. She has never used smokeless tobacco. She reports that she does not currently use alcohol. She reports that she does not use drugs. Allergies:  Allergies       Allergies  Allergen Reactions   Morphine And Codeine Anaphylaxis   Montelukast        Other reaction(s): Other (See Comments) Nightmares   Antihistamines, Diphenhydramine-Type Nausea Only   Aspirin Other (See Comments)      Patient on blood thinners and was told not to take   Benadryl [Diphenhydramine Hcl] Hives   Codeine Other (See Comments)      AMS            Medications Prior to Admission  Medication Sig Dispense Refill   ascorbic acid (VITAMIN C) 100 MG tablet Take 1,000 mg by mouth daily.       cyanocobalamin (VITAMIN B12) 1000 MCG tablet Take 1 tablet (1,000 mcg total) by mouth daily. 90 tablet 1   donepezil (ARICEPT) 10 MG tablet Take 10 mg by mouth at bedtime. (Patient not taking: Reported on 01/31/2024)  ergocalciferol (VITAMIN D2) 1.25 MG (50000 UT) capsule Take 50,000 Units by mouth every Friday.       ferrous sulfate 325 (65 FE) MG tablet Take by mouth.       levothyroxine (SYNTHROID) 125 MCG tablet Take 125 mcg by mouth daily before breakfast.       polyethylene glycol (MIRALAX / GLYCOLAX) 17 g packet Take 17 g by mouth daily. 14 each 0   rosuvastatin (CRESTOR) 20 MG tablet Take 20 mg by mouth daily.       senna (SENOKOT) 8.6 MG TABS tablet Take 1 tablet (8.6 mg total) by mouth daily. 120 tablet 0   sertraline (ZOLOFT) 25 MG tablet Take 25 mg by mouth daily. (Patient not taking: Reported on 01/31/2024)                  Home: Home Living Family/patient expects to be discharged to:: Private residence Living Arrangements: Children, Other relatives Available Help at Discharge: Family, Available PRN/intermittently Type of Home: Mobile home Home Access: Stairs to enter Entrance Stairs-Number of Steps: 6 Entrance Stairs-Rails: Right Home Layout: One level Bathroom  Shower/Tub: Engineer, manufacturing systems: Standard Home Equipment: Information systems manager, Agricultural consultant (2 wheels), BSC/3in1 Additional Comments: Pt states she lives with her daughter.  Lives With: Daughter   Functional History: Prior Function Prior Level of Function : Needs assist Mobility Comments: uses RW, reports no falls. ADLs Comments: Pt states her daughter helps with all dressing, bathing, able to self feed.   Functional Status:  Mobility: Bed Mobility Overal bed mobility: Needs Assistance Bed Mobility: Supine to Sit, Sit to Supine Supine to sit: Min assist, HOB elevated, Used rails Sit to supine: Contact guard assist, Used rails General bed mobility comments: min A to assist OOB with HOB, CGA return to bed Transfers Overall transfer level: Needs assistance Equipment used: Rolling walker (2 wheels) Transfers: Sit to/from Stand, Bed to chair/wheelchair/BSC Sit to Stand: Contact guard assist Bed to/from chair/wheelchair/BSC transfer type:: Step pivot Step pivot transfers: Contact guard assist General transfer comment: CGA for guiding due to blindness Ambulation/Gait Ambulation/Gait assistance: Mod assist Gait Distance (Feet): 20 Feet (20 ft) Assistive device: 1 person hand held assist Gait Pattern/deviations: Decreased step length - left, Decreased step length - right, Decreased stride length, Decreased dorsiflexion - right, Decreased dorsiflexion - left General Gait Details: Pt ambulates bed>bathroom>recliner without AD or with 1UE HHA with mod assist with pt demonstrating R lateral lean, decreased step length BLE, decreased stride length. Gait velocity: decreased   ADL: ADL Overall ADL's : Needs assistance/impaired, At baseline Eating/Feeding: Minimal assistance Grooming: Set up, Sitting Upper Body Bathing: Minimal assistance, Sitting Lower Body Bathing: Minimal assistance, Sit to/from stand Upper Body Dressing : Minimal assistance, Sitting Lower Body Dressing: Moderate  assistance, Sit to/from stand Toilet Transfer: Contact guard assist, Rolling walker (2 wheels) Toileting- Clothing Manipulation and Hygiene: Moderate assistance, Sit to/from stand Functional mobility during ADLs: Contact guard assist, Rolling walker (2 wheels) General ADL Comments: Pt min-mod A for dressing/bathing, limited by blindness, good overall strength and ROM to complete tasks.   Cognition: Cognition Overall Cognitive Status: Difficult to assess Arousal/Alertness: Awake/alert Orientation Level: Oriented to person, Oriented to place, Disoriented to time, Disoriented to situation Year: Other (Comment) (I dont know) Month: October Attention: Sustained Focused Attention: Appears intact Sustained Attention: Impaired Sustained Attention Impairment: Verbal complex, Verbal basic Memory: Impaired Memory Impairment: Decreased recall of new information Awareness: Impaired Awareness Impairment: Intellectual impairment, Emergent impairment Problem Solving Impairment: Verbal basic, Functional basic  Cognition Arousal: Alert Behavior During Therapy: Flat affect Overall Cognitive Status: Difficult to assess   Physical Exam: Blood pressure 113/63, pulse 77, temperature 98.3 F (36.8 C), temperature source Oral, resp. rate 14, height 4' 9.01" (1.448 m), weight 48.1 kg, SpO2 96%. Physical Exam  General: No acute distress.  Laying in bed. Mood and affect are appropriate HEENT: No JVD, neck supple.  Poor dentition. Eyes: Bilateral blindness, exotropia Heart: Regular rate and rhythm no rubs murmurs or extra sounds Lungs: Clear to auscultation, breathing unlabored, no rales or wheezes Abdomen: Positive bowel sounds, soft nontender to palpation, nondistended Extremities: No clubbing, cyanosis, or edema Skin: No evidence of breakdown, no evidence of rash  Neurologic: Awake, alert, oriented to self, place and time with some cues.  Follows 3 out of 3 simple commands.  Obvious processing delays  and memory deficits.  Cranial nerves II through XII grossly intact.  No sensory deficits.  Motor 4 out of 5 throughout bilateral upper and lower extremities.  Reflexes intact.  No Hoffmann's, Babinski.  No obvious tone.       Lab Results Last 48 Hours        Results for orders placed or performed during the hospital encounter of 02/15/24 (from the past 48 hours)  Glucose, capillary     Status: Abnormal    Collection Time: 02/17/24  8:25 AM  Result Value Ref Range    Glucose-Capillary 105 (H) 70 - 99 mg/dL      Comment: Glucose reference range applies only to samples taken after fasting for at least 8 hours.  Glucose, capillary     Status: Abnormal    Collection Time: 02/17/24 12:30 PM  Result Value Ref Range    Glucose-Capillary 159 (H) 70 - 99 mg/dL      Comment: Glucose reference range applies only to samples taken after fasting for at least 8 hours.  Glucose, capillary     Status: Abnormal    Collection Time: 02/17/24  4:05 PM  Result Value Ref Range    Glucose-Capillary 146 (H) 70 - 99 mg/dL      Comment: Glucose reference range applies only to samples taken after fasting for at least 8 hours.  Glucose, capillary     Status: None    Collection Time: 02/17/24  9:15 PM  Result Value Ref Range    Glucose-Capillary 95 70 - 99 mg/dL      Comment: Glucose reference range applies only to samples taken after fasting for at least 8 hours.  Basic metabolic panel     Status: Abnormal    Collection Time: 02/18/24  7:30 AM  Result Value Ref Range    Sodium 137 135 - 145 mmol/L    Potassium 4.0 3.5 - 5.1 mmol/L    Chloride 103 98 - 111 mmol/L    CO2 23 22 - 32 mmol/L    Glucose, Bld 97 70 - 99 mg/dL      Comment: Glucose reference range applies only to samples taken after fasting for at least 8 hours.    BUN 15 8 - 23 mg/dL    Creatinine, Ser 4.09 0.44 - 1.00 mg/dL    Calcium 8.8 (L) 8.9 - 10.3 mg/dL    GFR, Estimated >81 >19 mL/min      Comment: (NOTE) Calculated using the CKD-EPI  Creatinine Equation (2021)      Anion gap 11 5 - 15      Comment: Performed at [facility], [address]  [place], [place] [address]  Magnesium     Status: None    Collection Time: 02/18/24  7:30 AM  Result Value Ref Range    Magnesium 1.9 1.7 - 2.4 mg/dL      Comment: Performed at [facility], [address] [place], [place] [address]  Glucose, capillary     Status: None    Collection Time: 02/18/24  8:05 AM  Result Value Ref Range    Glucose-Capillary 98 70 - 99 mg/dL      Comment: Glucose reference range applies only to samples taken after fasting for at least 8 hours.  Glucose, capillary     Status: Abnormal    Collection Time: 02/18/24 11:53 AM  Result Value Ref Range    Glucose-Capillary 146 (H) 70 - 99 mg/dL      Comment: Glucose reference range applies only to samples taken after fasting for at least 8 hours.  Glucose, capillary     Status: Abnormal    Collection Time: 02/18/24  4:22 PM  Result Value Ref Range    Glucose-Capillary 142 (H) 70 - 99 mg/dL      Comment: Glucose reference range applies only to samples taken after fasting for at least 8 hours.  Glucose, capillary     Status: Abnormal    Collection Time: 02/18/24  9:12 PM  Result Value Ref Range    Glucose-Capillary 110 (H) 70 - 99 mg/dL      Comment: Glucose reference range applies only to samples taken after fasting for at least 8 hours.      Imaging Results (Last 48 hours)  No results found.         Blood pressure 113/63, pulse 77, temperature 98.3 F (36.8 C), temperature source Oral, resp. rate 14, height 4' 9.01" (1.448 m), weight 48.1 kg, SpO2 96%.   Medical Problem List and Plan: 1. 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  Status post bur hole 01/30/2024             -patient may shower             -ELOS/Goals: 10 to 14 days, supervision PT/OT, min assist SLP  -Stable to admit to inpatient rehab 2.   Antithrombotics: -DVT/anticoagulation:  Mechanical: Antiembolism stockings, thigh (TED hose) Bilateral lower extremities             -antiplatelet therapy: N/A 3. Pain Management: Tylenol as needed 4. Mood/Behavior/Sleep: Aricept 10 mg nightly, Zoloft 25 mg daily, melatonin 5 mg nightly             -antipsychotic agents: N/A 5. Neuropsych/cognition: This patient is not capable of making decisions on her own behalf. 6. Skin/Wound Care: Routine skin checks 7. Fluids/Electrolytes/Nutrition: Routine in and outs with follow-up chemistries 8.  Antiphospholipid syndrome/history of DVT/thrombocytopenia secondary to antiphospholipid syndrome.  Followed by hematology services.  Chronic ELIQUIS REMAINS ON HOLD FOR AT LEAST TWO WEEKS GIVEN SDH WITH FOLLOW UP CT HEAD. 9.  Hypothyroidism.  Synthroid 10.  Hyperlipidemia.  Crestor 11.  Diabetes mellitus retinopathy/legally blind.  Latest hemoglobin A1c 4.8.  SSI    Mcarthur Rossetti Angiulli, PA-C 02/19/2024  I have examined the patient independently and edited the note for HPI, ROS, exam, assessment, and plan as appropriate. I am in agreement with the above recommendations.   Angelina Sheriff, DO 02/19/2024

## 2024-02-19 NOTE — Plan of Care (Signed)
  Problem: Education: Goal: Knowledge of General Education information will improve Description: Including pain rating scale, medication(s)/side effects and non-pharmacologic comfort measures Outcome: Progressing   Problem: Health Behavior/Discharge Planning: Goal: Ability to manage health-related needs will improve Outcome: Progressing   Problem: Clinical Measurements: Goal: Will remain free from infection Outcome: Progressing Goal: Diagnostic test results will improve Outcome: Progressing   Problem: Activity: Goal: Risk for activity intolerance will decrease Outcome: Progressing   Problem: Nutrition: Goal: Adequate nutrition will be maintained Outcome: Progressing   Problem: Safety: Goal: Ability to remain free from injury will improve Outcome: Progressing

## 2024-02-19 NOTE — Care Management CC44 (Signed)
 Condition Code 44 Documentation Completed  Patient Details  Name: Alison Lopez MRN: [id number] Date of Birth: [birth year]   Condition Code 44 given:  Yes Patient signature on Condition Code 44 notice:  Yes Documentation of 2 MD's agreement:  Yes Code 44 added to claim:  Yes    Gordy Clement, RN 02/19/2024, 1:57 PM

## 2024-02-19 NOTE — Progress Notes (Signed)
 Alison Sheriff, DO  Physician Physical Medicine and Rehabilitation   PMR Pre-admission    Signed   Date of Service: 02/19/2024  9:42 AM  Related encounter: Admission (Current) from 02/15/2024 in [facility]U   Signed     Expand All Collapse All  PMR Admission Coordinator Pre-Admission Assessment   Patient: Alison Lopez is an 68 y.o., female MRN: [id number] DOB: 20-May-1956 Height: 4' 9.01" (144.8 cm) Weight: 48.2 kg   Insurance Information HMO: yes    PPO:      PCP:      IPA:      80/20:      OTHER:  [employer] HMO      Policy#: [id number]      Subscriber: pt  CM Name: Alison Lopez      Phone#: [phone]      Fax#: [phone] Pre-Cert#: [id number] auth for CIR from Aundra Millet R with [facility] Medicare with updates due to Livia Snellen (ext [phone]) on 4/17 to fax listed above      Employer:  Benefits:  Phone #: [phone]     Name:  Eff. Date: 11/11/23     Deduct: $257 (met)      Out of Pocket Max: [phone]      Life Max: n/a CIR: $2185/admit      SNF: 20 full days Outpatient: 80%     Co-Pay: 20% Home Health: 100%      Co-Pay:  DME: 80%     Co-Pay: 20% Providers:  SECONDARY: Medicaid of [place]      Policy#: [id number]     Phone#:    Financial Counselor:       Phone#:    The "Data Collection Information Summary" for patients in Inpatient Rehabilitation Facilities with attached "Privacy Act Statement-Health Care Records" was provided and verbally reviewed with: Patient and Family   Emergency Contact Information Contact Information       Name Relation Home Work Mobile    Alison Lopez Daughter     [phone]    Alison Lopez     [phone]         Other Contacts       Name Relation Home Work Mobile    [place] (Chop) Alison Lopez     [phone]           Current Medical History  Patient Admitting Diagnosis:  bilat SDH s/p burr holes   History of Present Illness: Alison Lopez is a 68 year old  right-handed female with history of DVT on Eliquis, dementia maintained on Aricept, chronic thrombocytopenia, B12 deficiency, hypertension, diabetes mellitus.  Presented 01/29/[facility]24 to [facility] with altered mental status, right-sided weakness and urinary/fecal incontinence.  Family denied any known recent fall or trauma.  Cranial CT scan positive for bilateral mixed density subdural hematoma somewhat biconvex with a 17 mm maximum thickness on the left and 14 mm maximum thickness on the right.  Subsequent mass effect of both hemispheres although no significant midline shift.  Basilar cisterns remain patent.  No skull fracture or other acute intracranial abnormality.  Admission chemistries unremarkable except glucose 135, alcohol negative, platelets 52,000.  Underwent bilateral frontal burr hole for drainage of subdural hematoma 01/30/2024 per Dr. Marcell Barlow.  Latest follow-up cranial CT scan 01/31/2024 showed postoperative substantial decrease in bilateral subdural hematomas.  Residual blood up to 11 mm on the left, 4-5 mm on the right.  Significantly regressed intracranial mass effect.   Residual rightward midline shift  now 1-2 mm.  She was admitted to CIR on 3/31 and was participating well, making progress towards goals.  On 4/7 she developed increasing confusion and somnolence.  Head CT showed increase mixed attenuation of subdural collection measuring up to 13 mm compared to 11 mm, new focus of blood over the left parietal lobe measuring 5 mm in thickness, and new focus of acute blood over the posterior left occipital lobe up to 9 mm in thickness.  There is 2 mm of rightward shift.  She was admitted back to the acute setting for observation by neurosurgery.  Serial CTs demonstrated stability and neurosurgery recommended supportive management. Therapy evaluations completed and pt was recommended to return to comprehensive rehab program.   Complete NIHSS TOTAL: 7   Patient's medical record  from [facility] has been reviewed by the rehabilitation admission coordinator and physician.   Past Medical History      Past Medical History:  Diagnosis Date   Anxiety     Arthritis     Collagen vascular disease (HCC)     COPD (chronic obstructive pulmonary disease) (HCC)     DDD (degenerative disc disease), lumbar     Dementia (HCC)     Depression     Diabetes mellitus without complication (HCC)     DVT (deep venous thrombosis) (HCC)     Fibromyalgia     GERD (gastroesophageal reflux disease)     Graves' disease with exophthalmos     HLD (hyperlipidemia)     Hypertension     Hypothyroidism     Lupus     Osteoporosis            Has the patient had major surgery during 100 days prior to admission? Yes   Family History   family history includes Heart attack in her father; Stomach cancer in her mother.   Current Medications  Current Medications    Current Facility-Administered Medications:    acetaminophen (TYLENOL) tablet 650 mg, 650 mg, Oral, Q6H PRN **OR** acetaminophen (TYLENOL) suppository 650 mg, 650 mg, Rectal, Q6H PRN, Howerter, Justin B, DO   cyanocobalamin (VITAMIN B12) tablet 1,000 mcg, 1,000 mcg, Oral, Daily, Ghimire, Werner Lean, MD, 1,000 mcg at 02/19/24 0820   donepezil (ARICEPT) tablet 10 mg, 10 mg, Oral, QHS, Ghimire, Werner Lean, MD, 10 mg at 02/18/24 2211   ferrous sulfate tablet 325 mg, 325 mg, Oral, Q lunch, Ghimire, Werner Lean, MD, 325 mg at 02/18/24 1121   hydrALAZINE (APRESOLINE) injection 10 mg, 10 mg, Intravenous, Q4H PRN, Howerter, Justin B, DO   insulin aspart (novoLOG) injection 0-6 Units, 0-6 Units, Subcutaneous, TID WC, Howerter, Justin B, DO, 1 Units at 02/17/24 1258   levothyroxine (SYNTHROID) tablet 125 mcg, 125 mcg, Oral, QAC breakfast, Howerter, Justin B, DO, 125 mcg at 02/19/24 0816   melatonin tablet 5 mg, 5 mg, Oral, QHS, Ghimire, Shanker M, MD, 5 mg at 02/18/24 2212   ondansetron (ZOFRAN) injection 4 mg, 4 mg, Intravenous, Q6H PRN,  Howerter, Justin B, DO   rosuvastatin (CRESTOR) tablet 20 mg, 20 mg, Oral, Daily, Howerter, Justin B, DO, 20 mg at 02/19/24 4166   sertraline (ZOLOFT) tablet 25 mg, 25 mg, Oral, Daily, Ghimire, Werner Lean, MD, 25 mg at 02/19/24 0630     Patients Current Diet:  Diet Order                  Diet regular Room service appropriate? Yes; Fluid consistency: Thin  Diet effective now  Precautions / Restrictions Precautions Precautions: Fall Precaution/Restrictions Comments: blind Restrictions Weight Bearing Restrictions Per Provider Order: No    Has the patient had 2 or more falls or a fall with injury in the past year? Unknown   Prior Activity Level Household: household ambulatory with rollator, assist for ADLs and iADLs, lives with daughter who drives her to appointments   Prior Functional Level Self Care: Did the patient need help bathing, dressing, using the toilet or eating? Needed some help   Indoor Mobility: Did the patient need assistance with walking from room to room (with or without device)? Independent   Stairs: Did the patient need assistance with internal or external stairs (with or without device)? Needed some help   Functional Cognition: Did the patient need help planning regular tasks such as shopping or remembering to take medications? Needed some help   Patient Information Are you of Hispanic, Latino/a,or Spanish origin?: A. No, not of Hispanic, Latino/a, or Spanish origin What is your race?: A. White Do you need or want an interpreter to communicate with a doctor or health care staff?: 9. Unable to respond   Patient's Response To:  Health Literacy and Transportation Is the patient able to respond to health literacy and transportation needs?: Yes Health Literacy - How often do you need to have someone help you when you read instructions, pamphlets, or other written material from your doctor or pharmacy?: Often In the past 12 months, has  lack of transportation kept you from medical appointments or from getting medications?: No In the past 12 months, has lack of transportation kept you from meetings, work, or from getting things needed for daily living?: No   Journalist, newspaper / Equipment Home Equipment: Information systems manager, Agricultural consultant (2 wheels), BSC/3in1   Prior Device Use: Indicate devices/aids used by the patient prior to current illness, exacerbation or injury? Walker   Current Functional Level Cognition   Arousal/Alertness: Awake/alert Overall Cognitive Status: Difficult to assess Orientation Level: Oriented to person, Oriented to place, Disoriented to situation, Disoriented to time Attention: Sustained Focused Attention: Appears intact Sustained Attention: Impaired Sustained Attention Impairment: Verbal complex, Verbal basic Memory: Impaired Memory Impairment: Decreased recall of new information Awareness: Impaired Awareness Impairment: Intellectual impairment, Emergent impairment Problem Solving Impairment: Verbal basic, Functional basic    Extremity Assessment (includes Sensation/Coordination)   Upper Extremity Assessment: Generalized weakness, Difficult to assess due to impaired cognition RUE Deficits / Details: decreased functional use of RUE throughout session, requires cuing to attend to RUE to wash it during hand hygiene  Lower Extremity Assessment: Generalized weakness, Difficult to assess due to impaired cognition     ADLs   Overall ADL's : Needs assistance/impaired, At baseline Eating/Feeding: Minimal assistance Grooming: Set up, Sitting Upper Body Bathing: Minimal assistance, Sitting Lower Body Bathing: Minimal assistance, Sit to/from stand Upper Body Dressing : Minimal assistance, Sitting Lower Body Dressing: Moderate assistance, Sit to/from stand Toilet Transfer: Contact guard assist, Rolling walker (2 wheels) Toileting- Clothing Manipulation and Hygiene: Moderate assistance, Sit to/from  stand Functional mobility during ADLs: Contact guard assist, Rolling walker (2 wheels) General ADL Comments: Pt min-mod A for dressing/bathing, limited by blindness, good overall strength and ROM to complete tasks.     Mobility   Overal bed mobility: Needs Assistance Bed Mobility: Supine to Sit, Sit to Supine Supine to sit: Min assist, HOB elevated, Used rails Sit to supine: Contact guard assist, Used rails General bed mobility comments: min A to assist OOB with HOB, CGA return to bed  Transfers   Overall transfer level: Needs assistance Equipment used: Rolling walker (2 wheels) Transfers: Sit to/from Stand, Bed to chair/wheelchair/BSC Sit to Stand: Contact guard assist Bed to/from chair/wheelchair/BSC transfer type:: Step pivot Step pivot transfers: Contact guard assist General transfer comment: CGA for guiding due to blindness     Ambulation / Gait / Stairs / Wheelchair Mobility   Ambulation/Gait Ambulation/Gait assistance: Mod assist Gait Distance (Feet): 20 Feet (20 ft) Assistive device: 1 person hand held assist Gait Pattern/deviations: Decreased step length - left, Decreased step length - right, Decreased stride length, Decreased dorsiflexion - right, Decreased dorsiflexion - left General Gait Details: Pt ambulates bed>bathroom>recliner without AD or with 1UE HHA with mod assist with pt demonstrating R lateral lean, decreased step length BLE, decreased stride length. Gait velocity: decreased     Posture / Balance Dynamic Sitting Balance Sitting balance - Comments: sitting EOB with ADLs Balance Overall balance assessment: Needs assistance Sitting-balance support: No upper extremity supported, Feet supported Sitting balance-Leahy Scale: Good Sitting balance - Comments: sitting EOB with ADLs Standing balance support: Single extremity supported, During functional activity Standing balance-Leahy Scale: Fair Standing balance comment: able to stand with one hand supported, no  LOB     Special needs/care consideration Skin burr hole incisions and Behavioral consideration history of mild dementia    Previous Home Environment (from acute therapy documentation) Living Arrangements: Children, Other relatives  Lives With: Daughter Available Help at Discharge: Family, Available PRN/intermittently Type of Home: Mobile home Home Layout: One level Home Access: Stairs to enter Entrance Stairs-Rails: Right Entrance Stairs-Number of Steps: 6 Bathroom Shower/Tub: Engineer, manufacturing systems: Standard Home Care Services: No Additional Comments: Pt states she lives with her daughter.   Discharge Living Setting Plans for Discharge Living Setting: Lives with (comment) (daughter) Type of Home at Discharge: House Discharge Home Layout: One level Discharge Home Access: Stairs to enter Entrance Stairs-Rails: Right Entrance Stairs-Number of Steps: 4 Discharge Bathroom Shower/Tub: Tub/shower unit Discharge Bathroom Toilet: Standard Discharge Bathroom Accessibility: Yes How Accessible: Accessible via walker Does the patient have any problems obtaining your medications?: No   Social/Family/Support Systems Anticipated Caregiver: daughter, Actuary Information: [phone] Ability/Limitations of Caregiver: Hospital doctor is working with [employer] to become pt's caregiver, she will provide 24/7 once pt discharges Caregiver Availability: 24/7 Discharge Plan Discussed with Primary Caregiver: Yes Is Caregiver In Agreement with Plan?: Yes Does Caregiver/Family have Issues with Lodging/Transportation while Pt is in Rehab?: No   Goals Patient/Family Goal for Rehab: PT/OT supervision, SLP supervision to min assist Expected length of stay: 10-14 days Additional Information: Discharge plan: return to patients home where she lives with her daughter; daughter will provide 24/7 at discharge Pt/Family Agrees to Admission and willing to participate: Yes Program  Orientation Provided & Reviewed with Pt/Caregiver Including Roles  & Responsibilities: Yes Additional Information Needs: Daughter has questions for CSW regarding filling out paperwork from [employer] to become pt's caregiver (she already has this paperwork on hand)   Decrease burden of Care through IP rehab admission: n/a     Possible need for SNF placement upon discharge: Not anticipated.  Plan for discharge back to pt's home where she lives with her daughter.  Daughter to provide 24/7 assist.    Patient Condition: This patient's medical and functional status has changed since the consult dated: 02/05/24 in which the Rehabilitation Physician determined and documented that the patient's condition is appropriate for intensive rehabilitative care in an inpatient rehabilitation facility. See "History of Present Illness" (above) for  medical update. Functional changes are: pt is currently min to mod assist with mobility, up to 20' x2 reps. Patient's medical and functional status update has been discussed with the Rehabilitation physician and patient remains appropriate for inpatient rehabilitation. Will admit to inpatient rehab today.   Preadmission Screen Completed By:  Stephania Fragmin, PT, DPT  02/19/2024 9:42 AM ______________________________________________________________________   Discussed status with Dr. Shearon Stalls on 02/19/24  at 9:42 AM  and received approval for admission today.   Admission Coordinator:  Stephania Fragmin, PT, DPT time 9:42 AM Dorna Bloom  02/19/24     Assessment/Plan: Diagnosis: Bilateral SDH  Does the need for close, 24 hr/day Medical supervision in concert with the patient's rehab needs make it unreasonable for this patient to be served in a less intensive setting? Yes Co-Morbidities requiring supervision/potential complications: Spontaneous/recurrent SDH, Thrombocytopenia, Antiphospholipid antibody syndrome with Hx DVT currently on eliquis hold, delirium, anemia, hypothyroidism, type 2  DM, dementia, urinary incontinence, and blindness Due to bladder management, bowel management, safety, skin/wound care, disease management, medication administration, pain management, and patient education, does the patient require 24 hr/day rehab nursing? Yes Does the patient require coordinated care of a physician, rehab nurse, PT, OT, and SLP to address physical and functional deficits in the context of the above medical diagnosis(es)? Yes Addressing deficits in the following areas: balance, endurance, locomotion, strength, transferring, bowel/bladder control, bathing, dressing, feeding, grooming, toileting, and cognition Can the patient actively participate in an intensive therapy program of at least 3 hrs of therapy 5 days a week? Yes The potential for patient to make measurable gains while on inpatient rehab is good Anticipated functional outcomes upon discharge from inpatient rehab: supervision PT, supervision OT, min assist SLP Estimated rehab length of stay to reach the above functional goals is: 10-14 days Anticipated discharge destination: Home 10. Overall Rehab/Functional Prognosis: good     MD Signature:   Alison Sheriff, DO 02/19/2024          Revision History

## 2024-02-19 NOTE — Progress Notes (Signed)
 Inpatient Rehab Admissions Coordinator:    I have insurance approval and a bed available for pt to admit to CIR today. Dr. Jerral Ralph in agreement and [facility] aware.  I will notify pt/family this morning and make arrangements to admit today.   Estill Dooms, PT, DPT Admissions Coordinator [phone] 02/19/24  9:42 AM

## 2024-02-19 NOTE — Progress Notes (Signed)
 Report called to [place] on 4W

## 2024-02-19 NOTE — Progress Notes (Addendum)
   02/19/24 1159  Mobility  Activity Moved into chair position in bed  Level of Assistance Maximum assist, patient does 25-49% (+2)  Assistive Device None  Activity Response Tolerated well  Mobility Referral Yes  Mobility visit 1 Mobility  Mobility Specialist Start Time (ACUTE ONLY) 1159  Mobility Specialist Stop Time (ACUTE ONLY) 1203  Mobility Specialist Time Calculation (min) (ACUTE ONLY) 4 min   Mobility Specialist: Progress Note Post-Mobility:    HR 71  NT requested assistance with repositioning pt. Pt agreeable to mobility session - received in bed lying on R side . Pt was asymptomatic throughout session with no complaints. Returned to sitting in chair position with all needs met - call bell within reach. Further Mobility deferred d/t pt being transferred to 4W today.   Barnie Mort, BS Mobility Specialist Please contact via SecureChat or  Rehab office at [phone].

## 2024-02-19 NOTE — Care Management Obs Status (Signed)
 MEDICARE OBSERVATION STATUS NOTIFICATION   Patient Details  Name: Alison Lopez MRN: [id number] Date of Birth: 1955-12-30   Medicare Observation Status Notification Given:       Gordy Clement, RN 02/19/2024, 1:48 PM

## 2024-02-19 NOTE — Progress Notes (Signed)
    Angelina Sheriff, DO  Physician Physical Medicine and Rehabilitation   Consult Note    Signed   Date of Service: 02/18/2024  9:24 AM  Related encounter: Admission (Current) from 02/15/2024 in [facility]   Signed      Alison Lopez is a 68 year old female with history of antiphospholipid antibody syndrome and DVT on Eliquis , chronic thrombocytopenia, cognitive decline on Aricept, diabetes, hypertension, diabetic retinopathy with blindness, and B12 deficiency who presented to inpatient rehab on 3-31 after hospitalization for spontaneous bilateral SDH with resulting burr hole 3-22 by Dr. Marcell Barlow.  She was found to have significant functional decline and right homonymous hemianopsia, and was making gains/participating in inpatient rehab prior to being sent off the unit for close neurologic monitoring on 4-7 after CT head for increased confusion revealed worsening and new SDH's.  Repeat CT head on 4-8 was unchanged, oncology and neurosurgery were consulted and given patient's ongoing thrombocytopenia and recurrent bleeds, decided to continue holding anticoagulation for an additional 2 weeks and continue with conservative management.  She is currently participating in therapies on acute, able to complete ADLs at a min to mod assist level, complete bed mobility with min assist, mod assist sit to stand, ambulation without assistive device and at a mod assist level.  She continues to be complicated by right inattention and weakness, and would benefit from returning to inpatient rehab to complete intensive therapies in pursuit of returning home with her children with 24/7 assistance.     Angelina Sheriff, DO 02/18/2024          Routing History

## 2024-02-19 NOTE — Progress Notes (Signed)
 Inpatient Rehabilitation Care Coordinator Assessment and Plan Patient Details  Name: Alison Lopez MRN: [id number] Date of Birth: 05-07-1956  Today's Date: 02/19/2024  Hospital Problems: Active Problems:   * No active hospital problems. *  Past Medical History:  Past Medical History:  Diagnosis Date   Anxiety    Arthritis    Collagen vascular disease (HCC)    COPD (chronic obstructive pulmonary disease) (HCC)    DDD (degenerative disc disease), lumbar    Dementia (HCC)    Depression    Diabetes mellitus without complication (HCC)    DVT (deep venous thrombosis) (HCC)    Fibromyalgia    GERD (gastroesophageal reflux disease)    Graves' disease with exophthalmos    HLD (hyperlipidemia)    Hypertension    Hypothyroidism    Lupus    Osteoporosis    Past Surgical History:  Past Surgical History:  Procedure Laterality Date   ABDOMINAL HYSTERECTOMY     BURR HOLE Bilateral 01/30/2024   Procedure: CREATION, CRANIAL BURR HOLE, SUBDURAL HEMATOMA EVACUATIOIN;  Surgeon: Venetia Night, MD;  Location: [facility] ORS;  Service: Neurosurgery;  Laterality: Bilateral;  need ventriculostomy x 2   COLONOSCOPY WITH PROPOFOL N/A 06/11/2021   Procedure: COLONOSCOPY WITH PROPOFOL;  Surgeon: Regis Bill, MD;  Location: [facility];  Service: Endoscopy;  Laterality: N/A;  prefers afternoon Eliquis   COLONOSCOPY WITH PROPOFOL N/A 12/19/2022   Procedure: COLONOSCOPY WITH PROPOFOL;  Surgeon: Regis Bill, MD;  Location: [facility];  Service: Endoscopy;  Laterality: N/A;   ESOPHAGOGASTRODUODENOSCOPY N/A 06/11/2021   Procedure: ESOPHAGOGASTRODUODENOSCOPY (EGD);  Surgeon: Regis Bill, MD;  Location[facility];  Service: Endoscopy;  Laterality: N/A;   EYE SURGERY Right    IVC FILTER REMOVAL N/A 07/21/2019   Procedure: IVC FILTER REMOVAL;  Surgeon: Annice Needy, MD;  Location: [facility];  Service: Cardiovascular;  Laterality: N/A;   LOWER EXTREMITY INTERVENTION  Right 04/22/2019   Procedure: IVC Filter Insertion with Right Lower Extremity Venous Lysis;  Surgeon: Annice Needy, MD;  Location: [facility];  Service: Cardiovascular;  Laterality: Right;   OOPHORECTOMY     Social History:  reports that she quit smoking about 23 years ago. Her smoking use included cigarettes. She started smoking about 40 years ago. She has a 17 pack-year smoking history. She has never used smokeless tobacco. She reports that she does not currently use alcohol. She reports that she does not use drugs.  Family / Support Systems    Social History Preferred language: English Religion: Forensic psychologist - How often do you need to have someone help you when you read instructions, pamphlets, or other written material from your doctor or pharmacy?: Never   Abuse/Neglect    Patient response to:    Emotional Status    Patient / Family Perceptions, Expectations & Goals    Community Resources Is the patient able to respond to transportation needs?: Yes In the past 12 months, has lack of transportation kept you from medical appointments or from getting medications?: No In the past 12 months, has lack of transportation kept you from meetings, work, or from getting things needed for daily living?: No  Discharge Planning Family / Support Systems Marital Status: Divorced How Long?: " a long time" Patient Roles: Parent Spouse/Significant Other: Divorced Children: Hospital doctor (dtr; lives with mother; works 5am-2:30pm/3pm); Tonye Becket (son; lives in [place]) Other Supports: a few friends to help assist when dtr is at [employer] Caregiver: daughter Ability/Limitations of  Caregiver: Pt dtr works; Medical laboratory scientific officer: 24/7 Family Dynamics: Pt has support with her dtr and grandadughter who live with her.   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, broadcasting/film/video: high school  Investment banker, operational - How often do you need to have someone help you when you read instructions, pamphlets, or other written material from your doctor or pharmacy?: Never Writes: Yes Employment Status: Retired Age Retired: 64 Marine scientist Issues: Denies Guardian/Conservator: No HCPOA    Abuse/Neglect Abuse/Neglect Assessment Can Be Completed: Yes Physical Abuse: Denies Verbal Abuse: Denies Sexual Abuse: Denies Exploitation of patient/patient's resources: Denies Self-Neglect: Denies   Patient response to: Social Isolation - How often do you feel lonely or isolated from those around you?: Never   Emotional Status Pt's affect, behavior and adjustment status: Pt in good spirits at time of visti, despite some confusion. pt dtr assisted with assessment Recent Psychosocial Issues: Denies Psychiatric History: Pt dtr reports Zoloft and Aricept prescribed by PCP Substance Abuse History: Pt quit smoking cigarettes 15-16 years. Denies rec and etoh use   Patient / Family Perceptions, Expectations & Goals Pt/Family understanding of illness & functional limitations: Pt dtr has general understanding of pt care needs Premorbid pt/family roles/activities: Independent mobility; asst with cognition Anticipated changes in roles/activities/participation: Assistance with ADLs/IADLs Pt/family expectations/goals: Pt goal is to work on walking and getting strong   Building surveyor: None Premorbid Home Care/DME Agencies: None Transportation available at discharge: Dtr Is the patient able to respond to transportation needs?: Yes In the past 12 months, has lack of transportation kept you from medical appointments or from getting medications?: No In the past 12 months, has lack of transportation kept you from meetings, work, or from getting things needed for daily living?: No Resource referrals recommended: Neuropsychology   Discharge Planning Living Arrangements:  Children, Other relatives Support Systems: Children, Other relatives, Friends/neighbors Type of Residence: Private residence Insurance Resources: Media planner (specify) Multimedia programmer Medicare) Financial Resources: Restaurant manager, fast food Screen Referred: No Living Expenses: Rent Money Management: Family Does the patient have any problems obtaining your medications?: No Home Management: Pt dtr nanaged all home care needs Patient/Family Preliminary Plans: Dtr Care Coordinator Barriers to Discharge: Decreased caregiver support, Lack of/limited family support, Insurance for SNF coverage Care Coordinator Anticipated Follow Up Needs: HH/OP   Clinical Impression SW met with pt, pt dtr and ot granddaughter in room to introduce self, explain role, inform on d/c process,and share no d/c date at this time due to pt recently admitted and first day of evals. Pt is not a Cytogeneticist. No HCPOA. DME: rollator and shower chair.   Clinical Impression Pt is a return patient and SW familiar. SW will follow-up with family to confirm there are no barriers to discharge.   Makynleigh Breslin A Kimbree Casanas 02/19/2024, 12:19 PM

## 2024-02-19 NOTE — Discharge Summary (Signed)
 PATIENT DETAILS Name: Alison Lopez Age: 68 y.o. Sex: female Date of Birth: 1956/08/21 MRN: [id number]. Admitting Physician: Angie Fava, DO WGN:FAOZHY, Marylin Crosby, MD  Admit Date: 02/15/2024 Discharge date: 02/19/2024  Recommendations for Outpatient Follow-up:  Follow up with PCP in 1-2 weeks Please obtain CMP/CBC in one week Please ensure follow-up with neurosurgery and hematology/oncology Repeat CT head in 2 weeks-and reassess if anticoagulation can be restarted.  Admitted From:  CIR  Disposition: CIR   Discharge Condition: good  CODE STATUS:   Code Status: Full Code   Diet recommendation:  Diet Order             Diet - low sodium heart healthy           Diet regular Room service appropriate? Yes; Fluid consistency: Thin  Diet effective now                    Brief Summary: Patient is a 68 y.o.  female with history of antiphospholipid syndrome/chronic thrombocytopenia-was recently hospitalized at [facility] from 3/21-3/31 for SDH requiring bur hole surgery-was subsequently transferred to CIR-admitted back to [facility] service on 4/7 for worsening confusion-CT head shows interval slight worsening of SDH.  Subsequent serial CT head stable-Case was discussed with patient's primary neurosurgeon-recommendations are to continue with supportive care-nonoperative management-and to repeat CT head in 2 weeks.     Significant events: 4/7>> admit to [facility] from [facility]-worsening confusion-interval worsening of SDH on CT head.   Significant studies: 4/7>> CT head: SDH-left cerebral convexity-measuring 13 mm-previously 11 mm.  New focus of blood in left parietal lobe-5 mm in thickness, acute blood products over left occipital lobe-8 mm intact mass. 4/8>> CT head: Unchanged hematomas compared to 4/7.   Significant microbiology data: None   Procedures: None   Consults: Phone consult-neurosurgery-Dr. Marcell Barlow. Heme-onc  Brief Hospital Course: SDH Awake/alert-nonfocal  exam Continue supportive care-neurosurgery Dr. Marcell Barlow recommending repeat CT head in 2 weeks Holding all therapeutic/prophylactic anticoagulation until then   Acute metabolic encephalopathy/confusion Unclear etiology-confused at CIR but has been awake/alert for the past 2 days without any major issues. Suspect given history of dementia-this may be delirium. Since improved with just supportive measures-doubt further workup is required Maintain delirium precautions.    Vitamin B12 deficiency Continue supplementation   Normocytic anemia Hb stable No evidence of GI bleeding-watch closely   Hypothyroidism Synthroid TSH 4/8 stable.   HLD Statin   DM-2 (A1c 4.8 on 3/22) CBG stable-SSI  HLD Statin   Antiphospholipid syndrome with history of DVT Thrombocytopenia secondary to antiphospholipid syndrome Hematology following-appreciate note on 4/8-holding anticoagulation for at least 2 weeks given SDH and concern for worsening. SCD's/frequent mobilization until then.     Dementia Continue Aricept Delirium precautions   Mood disorder Continue Zoloft   BMI: Estimated body mass index is 22.94 kg/m as calculated from the following:   Height as of this encounter: 4' 9.01" (1.448 m).   Weight as of this encounter: 48.1 kg.   Discharge Diagnoses:  Principal Problem:   Subdural hematoma (HCC) Active Problems:   HLD (hyperlipidemia)   DM2 (diabetes mellitus, type 2) (HCC)   Thrombocytopenia (HCC)   Acute encephalopathy   Acute anemia   Acquired hypothyroidism   Discharge Instructions:  Activity:  As tolerated with Full fall precautions use walker/cane & assistance as needed   Discharge Instructions     Diet - low sodium heart healthy   Complete by: As directed    Increase activity slowly  Complete by: As directed       Allergies as of 02/19/2024       Reactions   Morphine And Codeine Anaphylaxis   Montelukast    Other reaction(s): Other (See  Comments) Nightmares   Antihistamines, Diphenhydramine-type Nausea Only   Aspirin Other (See Comments)   Patient on blood thinners and was told not to take   Benadryl [diphenhydramine Hcl] Hives   Codeine Other (See Comments)   AMS        Medication List     TAKE these medications    ascorbic acid 100 MG tablet Commonly known as: VITAMIN C Take 1,000 mg by mouth daily.   cyanocobalamin 1000 MCG tablet Commonly known as: VITAMIN B12 Take 1 tablet (1,000 mcg total) by mouth daily.   donepezil 10 MG tablet Commonly known as: ARICEPT Take 10 mg by mouth at bedtime.   ergocalciferol 1.25 MG (50000 UT) capsule Commonly known as: VITAMIN D2 Take 50,000 Units by mouth every Friday.   ferrous sulfate 325 (65 FE) MG tablet Take by mouth.   levothyroxine 125 MCG tablet Commonly known as: SYNTHROID Take 125 mcg by mouth daily before breakfast.   melatonin 5 MG Tabs Take 1 tablet (5 mg total) by mouth at bedtime.   polyethylene glycol 17 g packet Commonly known as: MIRALAX / GLYCOLAX Take 17 g by mouth daily.   rosuvastatin 20 MG tablet Commonly known as: CRESTOR Take 20 mg by mouth daily.   senna 8.6 MG Tabs tablet Commonly known as: SENOKOT Take 1 tablet (8.6 mg total) by mouth daily.   sertraline 25 MG tablet Commonly known as: ZOLOFT Take 25 mg by mouth daily.        Follow-up Information     Rayetta Humphrey, MD. Schedule an appointment as soon as possible for a visit in 1 week(s).   Specialty: Family Medicine Contact information: [address] [place] [address] [phone]         Venetia Night, MD Follow up.   Specialty: Neurosurgery Contact information: [address] [place] [address] [phone]                Allergies  Allergen Reactions   Morphine And Codeine Anaphylaxis   Montelukast     Other reaction(s): Other (See Comments) Nightmares   Antihistamines, Diphenhydramine-Type  Nausea Only   Aspirin Other (See Comments)    Patient on blood thinners and was told not to take   Benadryl [Diphenhydramine Hcl] Hives   Codeine Other (See Comments)    AMS     Other Procedures/Studies: CT HEAD WO CONTRAST ( ) Result Date: 02/16/2024 CLINICAL DATA:  68 year old female with left subdural hematoma. EXAM: CT HEAD WITHOUT CONTRAST TECHNIQUE: Contiguous axial images were obtained from the base of the skull through the vertex without intravenous contrast. RADIATION DOSE REDUCTION: This exam was performed according to the departmental dose-optimization program which includes automated exposure control, adjustment of the mA and/or kV according to patient size and/or use of iterative reconstruction technique. COMPARISON:  Head CT yesterday and earlier. FINDINGS: Brain: Mixed density left side subdural hematoma redemonstrated. Thickness is 9-10 mm on coronal images and not changed from yesterday. Minority of hyperdense blood products not significantly changed. Trace subdural space gas also again noted on coronal image 15. Smaller mixed density right side subdural hematoma which is primarily limited to the posterior convexity is 4-5 mm, stable on coronal image 23. No significant midline shift. No significant mass effect  on the ventricles. Basilar cisterns remain patent. No ventriculomegaly. No new intracranial hemorrhage identified. Stable gray-white matter differentiation throughout the brain. Extensive chronic occipital and parietal lobe encephalomalacia, small chronic cerebellar infarcts. Vascular: Calcified atherosclerosis at the skull base. No suspicious intracranial vascular hyperdensity. Skull: Stable previous vertex burr holes. Sinuses/Orbits: Visualized paranasal sinuses and mastoids are stable and well aerated. Other: No acute orbit or scalp soft tissue finding. IMPRESSION: 1. Bilateral, left greater than right mixed density Subdural Hematomas are stable from yesterday. Left side up to  10 mm, and smaller on the Right 4-5 mm. 2. No significant intracranial mass effect. No new intracranial abnormality. 3. Advanced Chronic ischemic disease. Electronically Signed   By: Odessa Fleming M.D.   On: 02/16/2024 13:35   CT HEAD WO CONTRAST ( ) Addendum Date: 02/15/2024 ADDENDUM REPORT: 02/15/2024 18:40 ADDENDUM: These results were called by telephone at the time of interpretation on 02/15/2024 at 6:36 pm to provider Delle Reining, PA, who verbally acknowledged these results. Electronically Signed   By: Emily Filbert M.D.   On: 02/15/2024 18:40   Result Date: 02/15/2024 CLINICAL DATA:  Subdural hematoma, increased confusion and inattention on exam. EXAM: CT HEAD WITHOUT CONTRAST TECHNIQUE: Contiguous axial images were obtained from the base of the skull through the vertex without intravenous contrast. RADIATION DOSE REDUCTION: This exam was performed according to the departmental dose-optimization program which includes automated exposure control, adjustment of the mA and/or kV according to patient size and/or use of iterative reconstruction technique. COMPARISON:  CT head 01/31/2024 FINDINGS: Brain: Redemonstrated postsurgical changes of both subdural spaces with interval removal of surgical drains. There is a mixed attenuation subdural collection over the left cerebral convexity which measures up to 13 mm in thickness, previously measuring 11 mm. Associated mass effect on the parenchyma is similar to prior. There are additional areas of acute blood products posteriorly over the left parietal lobe measuring up to 5 mm in maximum coronal thickness. Additional blood products over the posterior left occipital lobe adjacent to the transverse sinus measuring up to 8 mm in thickness. Minimal residual subdural collection over the right parietal lobe near the vertex. Approximately 2 mm rightward midline shift is similar to prior. Interval resolution of pneumocephalus. Similar chronic microvascular ischemic changes. Remote  infarct in the right occipital lobe. Small remote infarcts in the left cerebellum. Generalized parenchymal volume loss. The basilar cisterns are patent. Vascular: Atherosclerosis of the carotid siphons. No hyperdense vessel. Skull: Normal. Negative for fracture or focal lesion. Sinuses/Orbits: Orbits are symmetric. Mild mucosal thickening in the ethmoid sinuses and left sphenoid sinus. Other: Mastoid air cells are clear. IMPRESSION: Slightly increased mixed attenuation subdural collection over the left cerebral convexity measuring up to 13 mm, previously 11 mm. New focus of acute blood products posteriorly over the left parietal lobe measuring up to 5 mm in thickness. Additional acute blood products over the posterior left occipital lobe measuring up to 8 mm in thickness. Overall similar mass effect with approximately 2 mm rightward midline shift. Chronic ischemic disease as above. Electronically Signed: By: Emily Filbert M.D. On: 02/15/2024 18:19   DG Chest 2 View Result Date: 02/13/2024 CLINICAL DATA:  Fever EXAM: CHEST - 2 VIEW COMPARISON:  02/12/2024 FINDINGS: No acute airspace disease. Suspect trace left pleural effusion or pleural thickening. Previously noted vague right lung base opacity is felt secondary to artifact. Normal cardiomediastinal silhouette with aortic atherosclerosis. IMPRESSION: No focal pulmonary opacity. Suspect trace left pleural effusion or pleural thickening. Electronically Signed   By: Selena Batten  Jake Samples M.D.   On: 02/13/2024 17:47   DG CHEST PORT 1 VIEW Result Date: 02/12/2024 CLINICAL DATA:  Fever EXAM: PORTABLE CHEST 1 VIEW COMPARISON:  CT 04/17/2021 FINDINGS: Scarring or atelectasis at the left base. Vague right lung base opacity. Normal cardiac size with aortic atherosclerosis. No pneumothorax IMPRESSION: Vague right lung base opacity, question soft tissue artifact versus focus of airspace disease. Suggest two-view chest radiograph follow-up when clinically feasible Electronically  Signed   By: Jasmine Pang M.D.   On: 02/12/2024 22:32   CT HEAD WO CONTRAST ( ) Result Date: 01/31/2024 CLINICAL DATA:  68 year old female with altered mental status and bilateral subdural hematomas at presentation. Postoperative now bilateral subdural evacuation. EXAM: CT HEAD WITHOUT CONTRAST TECHNIQUE: Contiguous axial images were obtained from the base of the skull through the vertex without intravenous contrast. RADIATION DOSE REDUCTION: This exam was performed according to the departmental dose-optimization program which includes automated exposure control, adjustment of the mA and/or kV according to patient size and/or use of iterative reconstruction technique. COMPARISON:  Head CT 01/29/2024. FINDINGS: Brain: Postoperative changes to both subdural spaces with bilateral subdural drains in place. Associated pneumocephalus. Decreased volume of right side subdural hematoma, residual now only 4-5 mm at most levels (previously up to 14 mm). Decreased volume of left side subdural hematoma, more mixed density now and residual up to maximum thickness of 11 mm (previously 17 mm). Subsequent decreased intracranial mass effect and improved ventricle size and configuration. Mild rightward midline shift now 1-2 mm (coronal image 35). Small volume left para falcine subdural blood and pneumocephalus also. Basilar cisterns are patent and mildly improved. No IVH or new areas of intracranial hemorrhage. Chronic parietal 0 and occipital lobe encephalomalacia more pronounced on the right. Small chronic left cerebellar infarcts. No cortically based acute infarct identified. Vascular: Calcified atherosclerosis at the skull base. No suspicious intracranial vascular hyperdensity. Skull: New bilateral vertex burr holes. Sinuses/Orbits: Visualized paranasal sinuses and mastoids are stable and well aerated. Other: Postoperative changes now to the bilateral scalp vertex with percutaneous drains and skin staples in place. Similar  leftward gaze. Postoperative changes to the right globe. IMPRESSION: 1. Postoperative substantial decrease in bilateral subdural hematomas with bilateral subdural drains in place. Residual blood up to 11 mm on the Left, 4-5 mm on the Right. 2. Significantly regressed intracranial mass effect. Residual rightward midline shift now 1-2 mm. 3. No new intracranial abnormality. Chronic ischemic disease. Electronically Signed   By: Odessa Fleming M.D.   On: 01/31/2024 05:16   CT Cervical Spine Wo Contrast Result Date: 01/29/2024 CLINICAL DATA:  Right upper and lower extremity weakness. Abrupt change. Neck trauma. EXAM: CT CERVICAL SPINE WITHOUT CONTRAST TECHNIQUE: Multidetector CT imaging of the cervical spine was performed without intravenous contrast. Multiplanar CT image reconstructions were also generated. RADIATION DOSE REDUCTION: This exam was performed according to the departmental dose-optimization program which includes automated exposure control, adjustment of the mA and/or kV according to patient size and/or use of iterative reconstruction technique. COMPARISON:  None Available. FINDINGS: Alignment: Slight degenerative anterolisthesis is present at C4-5. No other significant listhesis is present. Straightening of the normal cervical lordosis is present. Rightward curvature of the cervical spine is centered at C6. Skull base and vertebrae: The craniocervical junction is normal. Vertebral body heights are normal. No acute fractures are present. Soft tissues and spinal canal: The soft tissues of the neck are otherwise unremarkable. Salivary glands are within normal limits. Thyroid is normal. No significant adenopathy is present. No focal mucosal or  submucosal lesions are present. Disc levels: Chronic endplate degenerative changes are present at C5-6. Uncovertebral spurring leads to moderate left and mild right foraminal stenosis. 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 Upper chest: The lung apices are clear. The thoracic  inlet is within normal limits. IMPRESSION: 1. No acute or focal lesion to explain the patient's symptoms. 2. Chronic endplate degenerative changes at C5-6 with moderate left and mild right foraminal stenosis. 3. 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 Electronically Signed   By: Marin Roberts M.D.   On: 01/29/2024 15:08   CT HEAD WO CONTRAST Addendum Date: 01/29/2024 ADDENDUM REPORT: 01/29/2024 13:05 ADDENDUM: Study discussed by telephone with Dr. Artis Delay on 01/29/2024 at 1259 hours. Electronically Signed   By: Odessa Fleming M.D.   On: 01/29/2024 13:05   Result Date: 01/29/2024 CLINICAL DATA:  68 year old female with abrupt onset altered mental status, incontinence, not moving right arm. History of dementia. EXAM: CT HEAD WITHOUT CONTRAST TECHNIQUE: Contiguous axial images were obtained from the base of the skull through the vertex without intravenous contrast. RADIATION DOSE REDUCTION: This exam was performed according to the departmental dose-optimization program which includes automated exposure control, adjustment of the mA and/or kV according to patient size and/or use of iterative reconstruction technique. COMPARISON:  Head CT 02/13/2005. FINDINGS: Brain: New since 2006 bihemispheric hypodense subdural hematomas with intracranial mass effect. Both collections are fairly uniformly hypodense throughout much of the hemisphere but there is layering hyperdense blood posteriorly on both sides (series 2, image 17). Both collections are somewhat biconvex. That on the left measures up to 17 mm in maximum thickness (coronal image 42), on the right 14 mm in maximum thickness. Questionable trace para falcine hyperdense blood products also on the left coronal image 17. But no other convincing multi spatial hemorrhage. Subsequently there is no significant midline shift. Mass effect on the lateral ventricles but the basilar cisterns remain patent. Incidental choroid plexus cysts (normal variant). Chronic appearing  encephalomalacia in both occipital poles (series 2, image 15), new since 2006. No cortically based acute infarct identified. And multiple small chronic appearing cerebellar infarcts more numerous on the left. Other patchy bilateral cerebral white matter hypodensity. Vascular: Calcified atherosclerosis at the skull base. No suspicious intracranial vascular hyperdensity. Skull: Intact.  No acute osseous abnormality identified. Sinuses/Orbits: Visualized paranasal sinuses and mastoids are stable and well aerated. Other: Visualized orbits and scalp soft tissues are within normal limits. IMPRESSION: 1. Positive for bilateral mixed density Subdural Hematomas, somewhat biconvex and with 17 mm maximum thickness on the Left and 14 mm maximum thickness on the Right. Subsequent mass effect on both hemispheres although no significant midline shift. Basilar cisterns remain patent. 2. No skull fracture or other acute intracranial abnormality identified. 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 Electronically Signed: By: Odessa Fleming M.D. On: 01/29/2024 12:57     TODAY-DAY OF DISCHARGE:  Subjective:   Alison Lopez today has no headache,no chest abdominal pain,no new weakness tingling or numbness, feels much better wants to go home today.  Objective:   Blood pressure (!) 114/42, pulse 60, temperature 98.9 F (37.2 C), temperature source Oral, resp. rate 16, height 4' 9.01" (1.448 m), weight 48.2 kg, SpO2 95%. No intake or output data in the 24 hours ending 02/19/24 0958 Filed Weights   02/16/24 2338 02/18/24 0500 02/19/24 0500  Weight: 49.3 kg 48.1 kg 48.2 kg    Exam: Awake Alert, Oriented *3, No new F.N deficits, Normal affect [place].AT,PERRAL Supple Neck,No JVD, No  cervical lymphadenopathy appriciated.  Symmetrical Chest wall movement, Good air movement bilaterally, CTAB RRR,No Gallops,Rubs or new Murmurs, No Parasternal Heave +ve B.Sounds, Abd Soft, Non tender, No  organomegaly appriciated, No rebound -guarding or rigidity. No Cyanosis, Clubbing or edema, No new Rash or bruise   PERTINENT RADIOLOGIC STUDIES: No results found.   PERTINENT LAB RESULTS: CBC: Recent Labs    02/17/24 0428  WBC 6.5  HGB 10.7*  HCT 31.3*  PLT 74*   CMET CMP     Component Value Date/Time   NA 137 02/18/2024 0730   K 4.0 02/18/2024 0730   CL 103 02/18/2024 0730   CO2 23 02/18/2024 0730   GLUCOSE 97 02/18/2024 0730   BUN 15 02/18/2024 0730   CREATININE 0.82 02/18/2024 0730   CREATININE 0.95 01/21/2024 1040   CALCIUM 8.8 (L) 02/18/2024 0730   PROT 5.7 (L) 02/16/2024 0436   ALBUMIN 2.5 (L) 02/16/2024 0436   AST 19 02/16/2024 0436   AST 17 01/21/2024 1040   ALT 25 02/16/2024 0436   ALT 9 01/21/2024 1040   ALKPHOS 59 02/16/2024 0436   BILITOT 0.3 02/16/2024 0436   BILITOT 0.8 01/21/2024 1040   GFRNONAA >60 02/18/2024 0730   GFRNONAA >60 01/21/2024 1040    GFR Estimated Creatinine Clearance: 43.3 mL/min (by C-G formula based on SCr of 0.82 mg/dL). No results for input(s): "LIPASE", "AMYLASE" in the last 72 hours. No results for input(s): "CKTOTAL", "CKMB", "CKMBINDEX", "TROPONINI" in the last 72 hours. Invalid input(s): "POCBNP" No results for input(s): "DDIMER" in the last 72 hours. No results for input(s): "HGBA1C" in the last 72 hours. No results for input(s): "CHOL", "HDL", "LDLCALC", "TRIG", "CHOLHDL", "LDLDIRECT" in the last 72 hours. No results for input(s): "TSH", "T4TOTAL", "T3FREE", "THYROIDAB" in the last 72 hours.  Invalid input(s): "FREET3" No results for input(s): "VITAMINB12", "FOLATE", "FERRITIN", "TIBC", "IRON", "RETICCTPCT" in the last 72 hours. Coags: No results for input(s): "INR" in the last 72 hours.  Invalid input(s): "PT" Microbiology: No results found for this or any previous visit (from the past 240 hours).  FURTHER DISCHARGE INSTRUCTIONS:  Get Medicines reviewed and adjusted: Please take all your medications with you  for your next visit with your Primary MD  Laboratory/radiological data: Please request your Primary MD to go over all hospital tests and procedure/radiological results at the follow up, please ask your Primary MD to get all Hospital records sent to his/her office.  In some cases, they will be blood work, cultures and biopsy results pending at the time of your discharge. Please request that your primary care M.D. goes through all the records of your hospital data and follows up on these results.  Also Note the following: If you experience worsening of your admission symptoms, develop shortness of breath, life threatening emergency, suicidal or homicidal thoughts you must seek medical attention immediately by calling 911 or calling your MD immediately  if symptoms less severe.  You must read complete instructions/literature along with all the possible adverse reactions/side effects for all the Medicines you take and that have been prescribed to you. Take any new Medicines after you have completely understood and accpet all the possible adverse reactions/side effects.   Do not drive when taking Pain medications or sleeping medications (Benzodaizepines)  Do not take more than prescribed Pain, Sleep and Anxiety Medications. It is not advisable to combine anxiety,sleep and pain medications without talking with your primary care practitioner  Special Instructions: If you have smoked or chewed Tobacco  in the  last 2 yrs please stop smoking, stop any regular Alcohol  and or any Recreational drug use.  Wear Seat belts while driving.  Please note: You were cared for by a hospitalist during your hospital stay. Once you are discharged, your primary care physician will handle any further medical issues. Please note that NO REFILLS for any discharge medications will be authorized once you are discharged, as it is imperative that you return to your primary care physician (or establish a relationship with a  primary care physician if you do not have one) for your post hospital discharge needs so that they can reassess your need for medications and monitor your lab values.  Total Time spent coordinating discharge including counseling, education and face to face time equals greater than 30 minutes.  SignedJeoffrey Massed 02/19/2024 9:58 AM

## 2024-02-19 NOTE — Care Management Obs Status (Signed)
 MEDICARE OBSERVATION STATUS NOTIFICATION   Patient Details  Name: Alison Lopez MRN: [id number] Date of Birth: Dec 30, 1955   Medicare Observation Status Notification Given:       Gordy Clement, RN 02/19/2024, 1:57 PM

## 2024-02-20 LAB — GLUCOSE, CAPILLARY
Glucose-Capillary: 114 mg/dL — ABNORMAL HIGH (ref 70–99)
Glucose-Capillary: 109 mg/dL — ABNORMAL HIGH (ref 70–99)
Glucose-Capillary: 161 mg/dL — ABNORMAL HIGH (ref 70–99)
Glucose-Capillary: 87 mg/dL (ref 70–99)

## 2024-02-20 NOTE — Progress Notes (Signed)
 PROGRESS NOTE   Subjective/Complaints: No new complaints this morning Nurse tech says that her current cup is too heavy for her to lift, messaged SLP to see if she can provide her with a lighter one  ROS: +impaired cognition   Objective:   No results found. No results for input(s): "WBC", "HGB", "HCT", "PLT" in the last 72 hours. Recent Labs    02/18/24 0730  NA 137  K 4.0  CL 103  CO2 23  GLUCOSE 97  BUN 15  CREATININE 0.82  CALCIUM 8.8*    Intake/Output Summary (Last 24 hours) at 02/20/2024 1716 Last data filed at 02/20/2024 1245 Gross per 24 hour  Intake 476 ml  Output --  Net 476 ml        Physical Exam: Vital Signs Blood pressure 137/69, pulse (!) 59, temperature 98.6 F (37 C), resp. rate 16, height 4\' 9"  (1.448 m), weight 49.7 kg, SpO2 96%. Gen: no distress, normal appearing HEENT: oral mucosa pink and moist, NCAT Cardio: Reg rate Chest: normal effort, normal rate of breathing Abd: soft, non-distended Ext: no edema Psych: pleasant, normal affect Skin: intact Neurologic: Awake, alert, oriented to self, place and time with some cues.  Follows 3 out of 3 simple commands.  Obvious processing delays and memory deficits.  Cranial nerves II through XII grossly intact.  No sensory deficits.  Motor 4 out of 5 throughout bilateral upper and lower extremities.  Reflexes intact.  No Hoffmann's, Babinski.  No obvious tone.     Assessment/Plan: 1. Functional deficits which require 3+ hours per day of interdisciplinary therapy in a comprehensive inpatient rehab setting. Physiatrist is providing close team supervision and 24 hour management of active medical problems listed below. Physiatrist and rehab team continue to assess barriers to discharge/monitor patient progress toward functional and medical goals  Care Tool:  Bathing    Body parts bathed by patient: Right arm, Chest, Abdomen, Left upper leg, Right  upper leg   Body parts bathed by helper: Right lower leg, Left lower leg, Buttocks, Front perineal area, Left arm, Face     Bathing assist Assist Level: Maximal Assistance - Patient 24 - 49%     Upper Body Dressing/Undressing Upper body dressing   What is the patient wearing?: Dress    Upper body assist Assist Level: Total Assistance - Patient < 25%    Lower Body Dressing/Undressing Lower body dressing      What is the patient wearing?: Incontinence brief     Lower body assist Assist for lower body dressing: Total Assistance - Patient < 25%     Toileting Toileting    Toileting assist Assist for toileting: Maximal Assistance - Patient 25 - 49%     Transfers Chair/bed transfer  Transfers assist     Chair/bed transfer assist level: Minimal Assistance - Patient > 75%     Locomotion Ambulation   Ambulation assist      Assist level: Minimal Assistance - Patient > 75% Assistive device: No Device Max distance: 100   Walk 10 feet activity   Assist     Assist level: Minimal Assistance - Patient > 75% Assistive device: No Device   Walk  50 feet activity   Assist    Assist level: Minimal Assistance - Patient > 75% Assistive device: No Device    Walk 150 feet activity   Assist Walk 150 feet activity did not occur: Safety/medical concerns         Walk 10 feet on uneven surface  activity   Assist     Assist level: Moderate Assistance - Patient - 50 - 74% Assistive device: Hand held assist   Wheelchair     Assist   Type of Wheelchair: Manual    Wheelchair assist level: Dependent - Patient 0% Max wheelchair distance: 150    Wheelchair 50 feet with 2 turns activity    Assist        Assist Level: Dependent - Patient 0%   Wheelchair 150 feet activity     Assist      Assist Level: Dependent - Patient 0%   Blood pressure 137/69, pulse (!) 59, temperature 98.6 F (37 C), resp. rate 16, height 4\' 9"  (1.448 m), weight  49.7 kg, SpO2 96%.  Medical Problem List and Plan: 1. 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  Status post bur hole 01/30/2024             -patient may shower             -ELOS/Goals: 10 to 14 days, supervision PT/OT, min assist SLP             -Stable to admit to inpatient rehab 2.  Antithrombotics: -DVT/anticoagulation:  Mechanical: Antiembolism stockings, thigh (TED hose) Bilateral lower extremities             -antiplatelet therapy: N/A 3. Pain Management: Tylenol as needed 4. Mood/Behavior/Sleep: continue Aricept 10 mg nightly, Zoloft 25 mg daily, melatonin 5 mg nightly              -antipsychotic agents: N/A 5. Neuropsych/cognition: This patient is not capable of making decisions on her own behalf.  6. Skin/Wound Care: Routine skin checks 7. Fluids/Electrolytes/Nutrition: Routine in and outs with follow-up chemistries  8.  Antiphospholipid syndrome/history of DVT/thrombocytopenia secondary to antiphospholipid syndrome.  Followed by hematology services.  Chronic ELIQUIS REMAINS ON HOLD FOR AT LEAST TWO WEEKS GIVEN SDH WITH FOLLOW UP CT HEAD.  9.  Hypothyroidism. continue Synthroid  10.  Hyperlipidemia.  Continue Crestor  11.  Diabetes mellitus retinopathy/legally blind.  Latest hemoglobin A1c 4.8.  SSI  12. Bradycardia: continue to monitor HR TID  LOS: 1 days A FACE TO FACE EVALUATION WAS PERFORMED  Keven Pel Niyam Bisping 02/20/2024, 5:16 PM

## 2024-02-20 NOTE — Progress Notes (Signed)
 I discussed / reviewed the pharmacy note by Joshua Zelada [facility]- PharmD candidate) and I agree with the resident's findings and plans as documented.  Marleta Simmer BS, PharmD, BCPS Clinical Pharmacist 02/19/2024 1:08 PM  Contact: [phone] after 3 PM  "Be curious, not judgmental..." -Rumalda Counter  ----------------------------------------------------------------------------------------------------------------------------------------------------------------------------------------------------------------------------------------------  Inpatient Rehabilitation Admission Medication Review by a Pharmacist   A complete drug regimen review was completed for this patient to identify any potential clinically significant medication issues.   High Risk Drug Classes Is patient taking? Indication by Medication  Antipsychotic No    Anticoagulant No    Antibiotic No    Opioid No    Antiplatelet No    Hypoglycemics/insulin Yes Insulin aspart - T2DM  Vasoactive Medication No    Chemotherapy No    Other Yes Aricept - dementia Synthroid - hypothyroidism Melatonin - insomnia Crestor - HLD Zoloft - depression        Type of Medication Issue Identified Description of Issue Recommendation(s)  Drug Interaction(s) (clinically significant)        Duplicate Therapy        Allergy        No Medication Administration End Date        Incorrect Dose        Additional Drug Therapy Needed        Significant med changes from prior encounter (inform family/care partners about these prior to discharge).      Other            Clinically significant medication issues were identified that warrant physician communication and completion of prescribed/recommended actions by midnight of the next day:  No   Name of provider notified for urgent issues identified:    Provider Method of Notification:        Pharmacist comments:    Time spent performing this drug regimen review (minutes):  30      Joshua  Zelada 02/19/2024 12:45 PM

## 2024-02-20 NOTE — Plan of Care (Signed)
  Problem: RH Swallowing Goal: LTG Patient will participate in dysphagia therapy to increase swallow function with assistance (SLP) Description: LTG:  Patient will participate in dysphagia therapy to increase swallow function with assistance (SLP) Flowsheets (Taken 02/20/2024 1607) LTG: Pt will participate in dysphagia therapy to increase swallow function with assistance of (SLP): Moderate Assistance - Patient 50 - 74%   Problem: RH Comprehension Communication Goal: LTG Patient will comprehend basic/complex auditory (SLP) Description: LTG: Patient will comprehend basic/complex auditory information with cues (SLP). Flowsheets (Taken 02/20/2024 1607) LTG: Patient will comprehend: Basic auditory information LTG: Patient will comprehend auditory information with cueing (SLP): Moderate Assistance - Patient 50 - 74%   Problem: RH Expression Communication Goal: LTG Patient will verbally express basic/complex needs(SLP) Description: LTG:  Patient will verbally express basic/complex needs, wants or ideas with cues  (SLP) Flowsheets (Taken 02/20/2024 1607) LTG: Patient will verbally express basic/complex needs, wants or ideas (SLP): Moderate Assistance - Patient 50 - 74% Goal: LTG Patient will increase word finding of common (SLP) Description: LTG:  Patient will increase word finding of common objects/daily info/abstract thoughts with cues using compensatory strategies (SLP). Flowsheets (Taken 02/20/2024 1607) LTG: Patient will increase word finding of common (SLP): Moderate Assistance - Patient 50 - 74% Patient will use compensatory strategies to increase word finding of:  Common objects  Daily info   Problem: RH Problem Solving Goal: LTG Patient will demonstrate problem solving for (SLP) Description: LTG:  Patient will demonstrate problem solving for basic/complex daily situations with cues  (SLP) Flowsheets (Taken 02/20/2024 1607) LTG Patient will demonstrate problem solving for: Moderate  Assistance - Patient 50 - 74%   Problem: RH Expression Communication Goal: LTG Patient will increase speech intelligibility (SLP) Description: LTG: Patient will increase speech intelligibility at word/phrase/conversation level with cues, % of the time (SLP) Flowsheets (Taken 02/20/2024 1609) LTG: Patient will increase speech intelligibility (SLP): Moderate Assistance - Patient 50 - 74% Percent of time patient will use intelligible speech: 90%

## 2024-02-20 NOTE — Evaluation (Addendum)
 Speech Language Pathology Assessment and Plan  Patient Details  Name: Alison Lopez MRN: [id number] Date of Birth: 05-31-1956  SLP Diagnosis: Aphasia;Speech and Language deficits;Apraxia;Cognitive Impairments;Dysphagia  Rehab Potential: Fair ELOS: 10-14 days    Today's Date: 02/20/2024 SLP Individual Time: 1100-1200 SLP Individual Time Calculation (min): 60 min   Hospital Problem: Principal Problem:   SDH (subdural hematoma) (HCC)  Past Medical History:  Past Medical History:  Diagnosis Date   Anxiety    Arthritis    Collagen vascular disease (HCC)    COPD (chronic obstructive pulmonary disease) (HCC)    DDD (degenerative disc disease), lumbar    Dementia (HCC)    Depression    Diabetes mellitus without complication (HCC)    DVT (deep venous thrombosis) (HCC)    Fibromyalgia    GERD (gastroesophageal reflux disease)    Graves' disease with exophthalmos    HLD (hyperlipidemia)    Hypertension    Hypothyroidism    Lupus    Osteoporosis    Past Surgical History:  Past Surgical History:  Procedure Laterality Date   ABDOMINAL HYSTERECTOMY     BURR HOLE Bilateral 01/30/2024   Procedure: CREATION, CRANIAL BURR HOLE, SUBDURAL HEMATOMA EVACUATIOIN;  Surgeon: Venetia Night, MD;  Location: [facility] ORS;  Service: Neurosurgery;  Laterality: Bilateral;  need ventriculostomy x 2   COLONOSCOPY WITH PROPOFOL N/A 06/11/2021   Procedure: COLONOSCOPY WITH PROPOFOL;  Surgeon: Regis Bill, MD;  Location: [facility] ENDOSCOPY;  Service: Endoscopy;  Laterality: N/A;  prefers afternoon Eliquis   COLONOSCOPY WITH PROPOFOL N/A 12/19/2022   Procedure: COLONOSCOPY WITH PROPOFOL;  Surgeon: Regis Bill, MD;  Location: [facility] ENDOSCOPY;  Service: Endoscopy;  Laterality: N/A;   ESOPHAGOGASTRODUODENOSCOPY N/A 06/11/2021   Procedure: ESOPHAGOGASTRODUODENOSCOPY (EGD);  Surgeon: Regis Bill, MD;  Location: [facility] ENDOSCOPY;  Service: Endoscopy;  Laterality: N/A;   EYE SURGERY Right    IVC  FILTER REMOVAL N/A 07/21/2019   Procedure: IVC FILTER REMOVAL;  Surgeon: Annice Needy, MD;  Location: [facility] INVASIVE CV LAB;  Service: Cardiovascular;  Laterality: N/A;   LOWER EXTREMITY INTERVENTION Right 04/22/2019   Procedure: IVC Filter Insertion with Right Lower Extremity Venous Lysis;  Surgeon: Annice Needy, MD;  Location: [facility] INVASIVE CV LAB;  Service: Cardiovascular;  Laterality: Right;   OOPHORECTOMY      Assessment / Plan / Recommendation Clinical Impression Pt is a 68 year old right-handed female with history of DVT maintained on Eliquis, dementia maintained on Aricept, chronic thrombocytopenia, antiphospholipid antibody syndrome followed by hematology services, B12 deficiency, hypertension, diabetes mellitus retinopathy and legally blind, quit smoking 23 years ago. Per chart review she lives with her children. 4 steps to entry of home. Ambulates household distances without assistive device. Presented 01/29/2024 to [facility] with altered mental status, right side weakness and urinary/fecal incontinence. Family denied any recent fall or trauma. Cranial CT scan positive for bilateral mixed density subdural hematoma somewhat biconvex with a 17 mm maximum thickness on the left and 14 mm maximum thickness on the right. Subsequent mass effect on both hemispheres although no significant midline shift. Basilar cisterns remain patent. No skull fracture or other acute intracranial abnormality. Admission chemistries unremarkable except glucose 135, alcohol negative, platelets 52,000. Underwent bilateral frontal burr hole for drainage of subdural hematoma 01/30/2024 per Dr. Marcell Barlow. Latest follow-up cranial CT scan 01/31/2024 showed postoperative substantial decrease in bilateral subdural hematomas. Residual blood up to 11 mm on the left, 4-5 mm on the right. Significantly regressed intracranial mass effect. Residual rightward midline  shift was now 1-2 mm. Therapy evaluations completed  and patient was admitted to inpatient rehab services 02/08/2024. Patient with slow progressive gains while on rehab services. Noted on 02/15/2024 patient with increasing altered mental status with cranial CT scan completed showing slightly increased mixed attenuation subdural collection over the left cerebral convexity measuring up to 13 mm, previously 11 mm. New focus of acute blood products posteriorly over the left parietal lobe measuring up to 5 mm in thickness. Additional acute blood products of the posterior left occipital lobe measuring up to 8 mm in thickness. Dr. Mont Antis of neurosurgery consulted in regards and scans reviewed patient was discharged to acute care services for ongoing monitoring and supportive care nonoperative 02/16/2024 with repeat scan completed again showing bilateral left greater than right mixed density subdural hematoma stable from previous scan left side of 10 mm and smaller on the right 4-5 mm. No significant intracranial mass effect. No new intracranial abnormality. Hematology continue to follow for antiphospholipid syndrome history of DVT thrombocytopenia and recommends holding anticoagulation for at least 2 weeks given SDH. Therapies have been resumed and monitored. Patient is admitted back to inpatient rehab services to resume comprehensive rehab therapies.   Cognitive-Linguistic: Severe cognitive-linguistic deficits noted overall. As compared to previous CIR eval 4/1, pt presented w/ reduced auditory comprehension/processing deficits, reduced initiation of responses, reduced problem solving, and increased perseverations. She also presented w/ increased difficulty following simple one and two step commands. Increased apraxia/groping noted during requested commands/movements, though no increase in apraxia noted in pt's speech. She did, however, present w/ reduced speech intelligibility d/t increased slurred speech and reduced volume at phrase/sentence level. Only able to recall  location spontaneously but with 2 choices, she was oriented to month date and year. Remains difficult to differentiate cog vs language errors given baseline cognitive deficits, however, deficits in both areas are present. Additionally, pt presents w/ increased visual deficits as compared to 4/1 and is essentially blind at this time. Significant visual deficits will continue to negatively impacts pt's ability to utilize external memory aids.   Bedside Swallow Eval: PO trials completed with thin liquids via straw (grape juice) and Dys 3 textures (saltine). She required hand over hand assist to self feed given visual and problem solving deficits. No overt s/s of airway invasion noted across trials. Pt presented w/ prolonged mastication prior to swallow initiation and her RNT reported some difficulty w/ mastication during morning meal. Poor dentition negatively impacts mastication; however, given increased cognitive-linguistic deficits and debility, she would benefit from downgrade to Dys 3 textures. Continue w/ thin liquids via straw and meds whole in puree. She will also need continued assistance (full supervision) during meals d/t dysphagia and self feeding deficits.   She would benefit from skilled ST services to target cognitive-linguistic deficits and oral dysphagia to maximize pt swallow safety/independence and reduce caregiver burden.      Skilled Therapeutic Interventions          SLP facilitated a cognitive-linguistic and bedside swallow evaluation to assess pt's cognitive-communication skills and determine need for additional skilled ST services. See above for more information.      SLP Assessment  Patient will need skilled Speech Lanaguage Pathology Services during CIR admission    Recommendations  Liquid Administration via: Straw Medication Administration: Whole meds with puree Supervision: Staff to assist with self feeding;Full supervision/cueing for compensatory  strategies Compensations: Minimize environmental distractions;Slow rate;Small sips/bites Postural Changes and/or Swallow Maneuvers: Seated upright 90 degrees Oral Care Recommendations: Oral care BID;Staff/trained caregiver  to provide oral care Recommendations for Other Services: Neuropsych consult;Therapeutic Recreation consult Therapeutic Recreation Interventions: Pet therapy Patient destination: Home Follow up Recommendations: Outpatient SLP;24 hour supervision/assistance Equipment Recommended: To be determined    SLP Frequency 3 to 5 out of 7 days   SLP Duration  SLP Intensity  SLP Treatment/Interventions 10-14 days  Minumum of 1-2 x/day, 30 to 90 minutes  Cognitive remediation/compensation;Internal/external aids;Speech/Language facilitation;Therapeutic Exercise;Cueing hierarchy;Functional tasks;Patient/family education;Therapeutic Activities    Pain  No pain reported   SLP Evaluation Cognition Overall Cognitive Status: Impaired/Different from baseline Arousal/Alertness: Awake/alert Orientation Level: Oriented to person;Oriented to place Year: 2025 Month: April Day of Week: Correct Attention: Focused;Sustained Focused Attention: Appears intact Sustained Attention: Impaired Sustained Attention Impairment: Verbal complex;Verbal basic Awareness: Impaired Problem Solving: Impaired Problem Solving Impairment: Verbal basic;Functional basic Organizing: Impaired Organizing Impairment: Verbal basic Comments: difficult to assess given language deficits as well  Comprehension Auditory Comprehension Overall Auditory Comprehension: Impaired Yes/No Questions: Impaired Basic Biographical Questions: 76-100% accurate Basic Immediate Environment Questions: 75-100% accurate Complex Questions: 50-74% accurate Commands: Impaired One Step Basic Commands: 50-74% accurate Two Step Basic Commands: 25-49% accurate Conversation: Simple Interfering Components: Hearing;Visual  impairments;Motor planning;Processing speed EffectiveTechniques: Extra processing time;Repetition;Increased volume Visual Recognition/Discrimination Discrimination: Not tested Reading Comprehension Reading Status: Unable to assess (comment) (visual deficits) Expression Expression Primary Mode of Expression: Verbal Verbal Expression Overall Verbal Expression: Impaired Initiation: Impaired Level of Generative/Spontaneous Verbalization: Phrase;Sentence Repetition: Impaired Level of Impairment: Sentence level Naming: Impairment Responsive: 51-75% accurate Confrontation: Impaired Verbal Errors: Semantic paraphasias;Phonemic paraphasias;Perseveration Interfering Components: Attention;Speech intelligibility Effective Techniques: Semantic cues;Phonemic cues;Sentence completion Non-Verbal Means of Communication: Not applicable Written Expression Dominant Hand: Right Written Expression: Unable to assess (comment) (visual deficits) Oral Motor Oral Motor/Sensory Function Overall Oral Motor/Sensory Function: Mild impairment Facial ROM: Reduced right Facial Symmetry: Abnormal symmetry right Facial Strength: Reduced right Facial Sensation: Within Functional Limits Lingual ROM: Within Functional Limits Lingual Symmetry: Within Functional Limits Lingual Sensation: Within Functional Limits Motor Speech Overall Motor Speech: Impaired Respiration: Within functional limits Phonation: Normal;Other (comment) (intermittently wet) Resonance: Within functional limits Articulation: Impaired Level of Impairment: Sentence Intelligibility: Intelligibility reduced Word: 75-100% accurate Phrase: 75-100% accurate Sentence: 75-100% accurate Conversation: 75-100% accurate Motor Planning: Impaired Level of Impairment: Phrase Motor Speech Errors: Inconsistent Interfering Components: Inadequate dentition;Hearing loss Effective Techniques: Increased vocal intensity;Pause  Care Tool Care Tool  Cognition Ability to hear (with hearing aid or hearing appliances if normally used Ability to hear (with hearing aid or hearing appliances if normally used): 1. Minimal difficulty - difficulty in some environments (e.g. when person speaks softly or setting is noisy)   Expression of Ideas and Wants Expression of Ideas and Wants: 2. Frequent difficulty - frequently exhibits difficulty with expressing needs and ideas   Understanding Verbal and Non-Verbal Content Understanding Verbal and Non-Verbal Content: 2. Sometimes understands - understands only basic conversations or simple, direct phrases. Frequently requires cues to understand  Memory/Recall Ability Memory/Recall Ability : That he or she is in a hospital/hospital unit   PMSV Assessment  PMSV Trial Intelligibility: Intelligibility reduced Word: 75-100% accurate Phrase: 75-100% accurate Sentence: 75-100% accurate Conversation: 75-100% accurate  Bedside Swallowing Assessment General Date of Onset: 01/29/24 Diet Prior to this Study: Regular;Thin liquids (Level 0) Temperature Spikes Noted: No Respiratory Status: Room air Behavior/Cognition: Alert;Cooperative;Pleasant mood Oral Cavity - Dentition: Poor condition Self-Feeding Abilities: Needs assist Patient Positioning: Upright in bed Baseline Vocal Quality: Normal  Oral Care Assessment Oral Assessment  (WDL): Exceptions to WDL Lips: Asymmetrical Teeth: Missing (Comment);Poor dental hygiene Tongue: Pink  Mucous Membrane(s): Moist Saliva: Moist, saliva free flowing Level of Consciousness: Alert Is patient on any of following O2 devices?: None of the above Nutritional status: Dependent feeder Oral Assessment Risk : High Risk    Short Term Goals: Week 1: SLP Short Term Goal 1 (Week 1): Pt will complete functional naming tasks w/ modA SLP Short Term Goal 2 (Week 1): Pt will utilize compensatory speech strategies at sentence level w/ maxA SLP Short Term Goal 3 (Week 1): Pt will  follow one step commands during functional cognitive tasks w/ modA SLP Short Term Goal 4 (Week 1): pt will solve problems during functional cognitive tasks w/ maxA  Refer to Care Plan for Long Term Goals  Recommendations for other services: Neuropsych and Therapeutic Recreation  Pet therapy  Discharge Criteria: Patient will be discharged from SLP if patient refuses treatment 3 consecutive times without medical reason, if treatment goals not met, if there is a change in medical status, if patient makes no progress towards goals or if patient is discharged from hospital.  The above assessment, treatment plan, treatment alternatives and goals were discussed and mutually agreed upon: No family available/patient unable  [name] 02/20/2024, 3:35 PM

## 2024-02-20 NOTE — Plan of Care (Signed)
 Problem: RH Balance Goal: LTG Patient will maintain dynamic standing with ADLs (OT) Description: LTG:  Patient will maintain dynamic standing balance with assist during activities of daily living (OT)  Flowsheets (Taken 02/20/2024 1646) LTG: Pt will maintain dynamic standing balance during ADLs with: Contact Guard/Touching assist   Problem: Sit to Stand Goal: LTG:  Patient will perform sit to stand in prep for activites of daily living with assistance level (OT) Description: LTG:  Patient will perform sit to stand in prep for activites of daily living with assistance level (OT) Flowsheets (Taken 02/20/2024 1646) LTG: PT will perform sit to stand in prep for activites of daily living with assistance level: Contact Guard/Touching assist   Problem: RH Eating Goal: LTG Patient will perform eating w/assist, cues/equip (OT) Description: LTG: Patient will perform eating with assist, with/without cues using equipment (OT) Flowsheets (Taken 02/20/2024 1646) LTG: Pt will perform eating with assistance level of: Contact Guard/Touching assist   Problem: RH Grooming Goal: LTG Patient will perform grooming w/assist,cues/equip (OT) Description: LTG: Patient will perform grooming with assist, with/without cues using equipment (OT) Flowsheets (Taken 02/20/2024 1646) LTG: Pt will perform grooming with assistance level of: Contact Guard/Touching assist   Problem: RH Bathing Goal: LTG Patient will bathe all body parts with assist levels (OT) Description: LTG: Patient will bathe all body parts with assist levels (OT) Flowsheets (Taken 02/20/2024 1646) LTG: Pt will perform bathing with assistance level/cueing: Contact Guard/Touching assist   Problem: RH Dressing Goal: LTG Patient will perform upper body dressing (OT) Description: LTG Patient will perform upper body dressing with assist, with/without cues (OT). Flowsheets (Taken 02/20/2024 1646) LTG: Pt will perform upper body dressing with assistance level  of: Supervision/Verbal cueing Goal: LTG Patient will perform lower body dressing w/assist (OT) Description: LTG: Patient will perform lower body dressing with assist, with/without cues in positioning using equipment (OT) Flowsheets (Taken 02/20/2024 1646) LTG: Pt will perform lower body dressing with assistance level of: Contact Guard/Touching assist   Problem: RH Toileting Goal: LTG Patient will perform toileting task (3/3 steps) with assistance level (OT) Description: LTG: Patient will perform toileting task (3/3 steps) with assistance level (OT)  Flowsheets (Taken 02/20/2024 1646) LTG: Pt will perform toileting task (3/3 steps) with assistance level: Minimal Assistance - Patient > 75%   Problem: RH Functional Use of Upper Extremity Goal: LTG Patient will use RT/LT upper extremity as a (OT) Description: LTG: Patient will use right/left upper extremity as a stabilizer/gross assist/diminished/nondominant/dominant level with assist, with/without cues during functional activity (OT) Flowsheets (Taken 02/20/2024 1646) LTG: Use of upper extremity in functional activities: RUE as diminished level LTG: Pt will use upper extremity in functional activity with assistance level of: Contact Guard/Touching assist   Problem: RH Toilet Transfers Goal: LTG Patient will perform toilet transfers w/assist (OT) Description: LTG: Patient will perform toilet transfers with assist, with/without cues using equipment (OT) Flowsheets (Taken 02/20/2024 1646) LTG: Pt will perform toilet transfers with assistance level of: Contact Guard/Touching assist   Problem: RH Tub/Shower Transfers Goal: LTG Patient will perform tub/shower transfers w/assist (OT) Description: LTG: Patient will perform tub/shower transfers with assist, with/without cues using equipment (OT) Flowsheets (Taken 02/20/2024 1646) LTG: Pt will perform tub/shower stall transfers with assistance level of: Contact Guard/Touching assist LTG: Pt will perform  tub/shower transfers from: Tub/shower combination   Problem: RH Attention Goal: LTG Patient will demonstrate this level of attention during functional activites (OT) Description: LTG:  Patient will demonstrate this level of attention during functional activites  (OT) Flowsheets (  Taken 02/20/2024 1646) Patient will demonstrate this level of attention during functional activites: Selective Patient will demonstrate above attention level in the following environment: Home LTG: Patient will demonstrate this level of attention during functional activites (OT): Moderate Assistance - Patient 50 - 74%   Problem: RH Awareness Goal: LTG: Patient will demonstrate awareness during functional activites type of (OT) Description: LTG: Patient will demonstrate awareness during functional activites type of (OT) Flowsheets (Taken 02/20/2024 1646) Patient will demonstrate awareness during functional activites type of: Emergent LTG: Patient will demonstrate awareness during functional activites type of (OT): Minimal Assistance - Patient > 75%

## 2024-02-20 NOTE — Evaluation (Signed)
 Physical Therapy Assessment and Plan  Patient Details  Name: Alison Lopez MRN: [id number] Date of Birth: 1955-11-18  PT Diagnosis: Abnormality of gait, Cognitive deficits, Hemiparesis dominant, Impaired sensation, and Muscle weakness Rehab Potential: Good ELOS: 7 to 10 days   Today's Date: 02/20/2024 PT Individual Time: 0846-1000 PT Individual Time Calculation (min): 74 min    Hospital Problem: Principal Problem:   SDH (subdural hematoma) (HCC)   Past Medical History:  Past Medical History:  Diagnosis Date   Anxiety    Arthritis    Collagen vascular disease (HCC)    COPD (chronic obstructive pulmonary disease) (HCC)    DDD (degenerative disc disease), lumbar    Dementia (HCC)    Depression    Diabetes mellitus without complication (HCC)    DVT (deep venous thrombosis) (HCC)    Fibromyalgia    GERD (gastroesophageal reflux disease)    Graves' disease with exophthalmos    HLD (hyperlipidemia)    Hypertension    Hypothyroidism    Lupus    Osteoporosis    Past Surgical History:  Past Surgical History:  Procedure Laterality Date   ABDOMINAL HYSTERECTOMY     BURR HOLE Bilateral 01/30/2024   Procedure: CREATION, CRANIAL BURR HOLE, SUBDURAL HEMATOMA EVACUATIOIN;  Surgeon: Venetia Night, MD;  Location: [facility] ORS;  Service: Neurosurgery;  Laterality: Bilateral;  need ventriculostomy x 2   COLONOSCOPY WITH PROPOFOL N/A 06/11/2021   Procedure: COLONOSCOPY WITH PROPOFOL;  Surgeon: Regis Bill, MD;  Location: [facility] ENDOSCOPY;  Service: Endoscopy;  Laterality: N/A;  prefers afternoon Eliquis   COLONOSCOPY WITH PROPOFOL N/A 12/19/2022   Procedure: COLONOSCOPY WITH PROPOFOL;  Surgeon: Regis Bill, MD;  Location: [facility] ENDOSCOPY;  Service: Endoscopy;  Laterality: N/A;   ESOPHAGOGASTRODUODENOSCOPY N/A 06/11/2021   Procedure: ESOPHAGOGASTRODUODENOSCOPY (EGD);  Surgeon: Regis Bill, MD;  Location[facility] ENDOSCOPY;  Service: Endoscopy;  Laterality: N/A;   EYE SURGERY  Right    IVC FILTER REMOVAL N/A 07/21/2019   Procedure: IVC FILTER REMOVAL;  Surgeon: Annice Needy, MD;  Location: [facility] INVASIVE CV LAB;  Service: Cardiovascular;  Laterality: N/A;   LOWER EXTREMITY INTERVENTION Right 04/22/2019   Procedure: IVC Filter Insertion with Right Lower Extremity Venous Lysis;  Surgeon: Annice Needy, MD;  Location: [facility] INVASIVE CV LAB;  Service: Cardiovascular;  Laterality: Right;   OOPHORECTOMY      Assessment & Plan Clinical Impression: Patient is a 68 year old right-handed female with history of DVT maintained on Eliquis, dementia maintained on Aricept, chronic thrombocytopenia, antiphospholipid antibody syndrome followed by hematology services, B12 deficiency, hypertension, diabetes mellitus retinopathy and legally blind, quit smoking 23 years ago.  Per chart review she lives with her children.  4 steps to entry of home.  Ambulates household distances without assistive device.  Presented 01/29/[facility] to [facility] with altered mental status, right side weakness and urinary/fecal incontinence.  Family denied any recent fall or trauma.  Cranial CT scan positive for bilateral mixed density subdural hematoma somewhat biconvex with a 17 mm maximum thickness on the left and 14 mm maximum thickness on the right.  Subsequent mass effect on both hemispheres although no significant midline shift.  Basilar cisterns remain patent.  No skull fracture or other acute intracranial abnormality.  Admission chemistries unremarkable except glucose 135, alcohol negative, platelets 52,000.  Underwent bilateral frontal burr hole for drainage of subdural hematoma 01/30/2024 per Dr. Marcell Barlow.  Latest follow-up cranial CT scan 01/31/2024 showed postoperative substantial decrease in bilateral subdural hematomas.  Residual blood up  to 11 mm on the left, 4-5 mm on the right.  Significantly regressed intracranial mass effect.  Residual rightward midline shift was now 1-2 mm.  Therapy  evaluations completed and patient was admitted to inpatient rehab services 02/08/2024.  Patient with slow progressive gains while on rehab services.  Noted on 02/15/2024 patient with increasing altered mental status with cranial CT scan completed showing slightly increased mixed attenuation subdural collection over the left cerebral convexity measuring up to 13 mm, previously 11 mm.  New focus of acute blood products posteriorly over the left parietal lobe measuring up to 5 mm in thickness.  Additional acute blood products of the posterior left occipital lobe measuring up to 8 mm in thickness.  Dr. Mont Antis of neurosurgery consulted in regards and scans reviewed patient was discharged to acute care services for ongoing monitoring and supportive care nonoperative 02/16/2024 with repeat scan completed again showing bilateral left greater than right mixed density subdural hematoma stable from previous scan left side of 10 mm and smaller on the right 4-5 mm.  No significant intracranial mass effect.  No new intracranial abnormality.  Hematology continue to follow for antiphospholipid syndrome history of DVT thrombocytopenia and recommends holding anticoagulation for at least 2 weeks given SDH.  Therapies have been resumed and monitored.  Patient is admitted back to inpatient rehab services to resume comprehensive rehab therapies. Patient transferred to CIR on 02/19/2024 .   Patient currently requires min to mod A with mobility secondary to muscle weakness and muscle paralysis, decreased cardiorespiratoy endurance, decreased visual acuity, decreased attention to right, and decreased awareness, decreased problem solving, decreased safety awareness, and decreased memory.  Prior to hospitalization, patient was modified independent  with mobility and lived with Daughter in a Mobile home home.  Home access is 6Stairs to enter.  Patient will benefit from skilled PT intervention to maximize safe functional mobility, minimize  fall risk, and decrease caregiver burden for planned discharge home with 24 hour assist.  Anticipate patient will benefit from follow up [facility] at discharge.  PT - End of Session Activity Tolerance: Tolerates 30+ min activity with multiple rests Endurance Deficit: Yes PT Assessment Rehab Potential (ACUTE/IP ONLY): Good PT Barriers to Discharge: Home environment access/layout PT Barriers to Discharge Comments: blind PT Patient demonstrates impairments in the following area(s): Balance;Safety;Motor;Endurance;Perception PT Transfers Functional Problem(s): Bed Mobility;Bed to Chair;Car PT Locomotion Functional Problem(s): Ambulation;Stairs PT Plan PT Intensity: Minimum of 1-2 x/day ,45 to 90 minutes PT Frequency: 5 out of 7 days PT Duration Estimated Length of Stay: 7 to 10 days PT Treatment/Interventions: Ambulation/gait training;Balance/vestibular training;Cognitive remediation/compensation;Discharge planning;DME/adaptive equipment instruction;Neuromuscular re-education;Functional mobility training;Therapeutic Activities;UE/LE Strength taining/ROM;UE/LE Coordination activities;Therapeutic Exercise;Stair training;Patient/family education PT Transfers Anticipated Outcome(s): S transfers, PT Locomotion Anticipated Outcome(s): contact gaurd for gait and stairs PT Recommendation Recommendations for Other Services: None Follow Up Recommendations: Home health PT Patient destination: Home Equipment Recommended: To be determined   PT Evaluation Precautions/Restrictions Precautions Precautions: Fall Recall of Precautions/Restrictions: Impaired Precaution/Restrictions Comments: blind Restrictions Weight Bearing Restrictions Per Provider Order: No General Chart Reviewed: Yes Family/Caregiver Present: No Vital Signs  Pain Pain Assessment Pain Scale: 0-10 Pain Score: 0-No pain Pain Interference Pain Interference Pain Effect on Sleep: 1. Rarely or not at all Pain Interference with Therapy  Activities: 1. Rarely or not at all Pain Interference with Day-to-Day Activities: 1. Rarely or not at all Home Living/Prior Functioning Home Living Available Help at Discharge: Family;Available PRN/intermittently Type of Home: Mobile home Home Access: Stairs to enter Entrance Stairs-Number of Steps:  6 Entrance Stairs-Rails: Right Home Layout: One level Additional Comments: Pt states she lives with her daughter.  Lives With: Daughter Prior Function Level of Independence: Independent with transfers;Independent with gait  Able to Take Stairs?: Yes Vision/Perception  Perception Perception: Impaired Preception Impairment Details: Inattention/Neglect (R inattention)  Cognition Overall Cognitive Status: History of cognitive impairments - at baseline Arousal/Alertness: Awake/alert Orientation Level: Oriented to person;Oriented to place Year: Other (Comment) (2012) Month: October Day of Week: Correct Memory: Impaired Memory Impairment: Decreased recall of new information Awareness: Impaired Problem Solving: Impaired Safety/Judgment: Impaired Sensation Sensation Light Touch: Appears Intact Coordination Gross Motor Movements are Fluid and Coordinated: No Coordination and Movement Description: R hemiparesis with inattention Motor  Motor Motor:  (R hemiparesis)  Mobility Bed Mobility Bed Mobility: Supine to Sit;Sit to Supine;Rolling Right;Rolling Left Rolling Right: Supervision/verbal cueing Rolling Left: Supervision/Verbal cueing Supine to Sit: Minimal Assistance - Patient > 75%;Contact Guard/Touching assist Sit to Supine: Supervision/Verbal cueing Transfers Transfers: Sit to Stand;Stand to Sit;Stand Pivot Transfers Sit to Stand: Minimal Assistance - Patient > 75% Stand to Sit: Minimal Assistance - Patient > 75% Stand Pivot Transfers: Minimal Assistance - Patient > 75%;Moderate Assistance - Patient 50 - 74% Transfer (Assistive device): None Locomotion Gait Ambulation:  Yes Gait Assistance: Minimal Assistance - Patient > 75% Gait Distance (Feet): 100 Feet Assistive device: None Gait Gait Pattern: Step-through pattern;Narrow base of support Gait velocity: decreased Stairs / Additional Locomotion Stairs: Yes Stairs Assistance: Minimal Assistance - Patient > 75%;Moderate Assistance - Patient 50 - 74% Stair Management Technique: One rail Right;Two rails Number of Stairs: 8 Height of Stairs: 6 Ramp: Minimal Assistance - Patient >75% Curb: Moderate Assistance - Patient 50 - 74% Wheelchair Mobility Wheelchair Mobility: Yes Wheelchair Assistance: Dependent - Patient 0% Distance: 150 Trunk/Postural Assessment  Cervical Assessment Cervical Assessment: Exceptions to [facility] (forward head posture) Thoracic Assessment Thoracic Assessment: Exceptions to [facility] (rounded shoulders) Lumbar Assessment Lumbar Assessment: Exceptions to [facility] (posterior pelvic tilt) Postural Control Postural Control: Deficits on evaluation Righting Reactions: delayed  Balance Balance Balance Assessed: Yes Static Sitting Balance Static Sitting - Balance Support: Bilateral upper extremity supported;Feet supported Static Sitting - Level of Assistance: 5: Stand by assistance Static Standing Balance Static Standing - Balance Support: Right upper extremity supported Static Standing - Level of Assistance: 4: Min assist Dynamic Standing Balance Dynamic Standing - Balance Support: Right upper extremity supported Dynamic Standing - Level of Assistance: 4: Min assist;3: Mod assist Extremity Assessment  B UEs as per OT evaluation RLE Assessment RLE Assessment: Exceptions to [facility] Passive Range of Motion (PROM) Comments: WFLs General Strength Comments: grossly 3-/5 to 3/5 LLE Assessment LLE Assessment: Exceptions to [facility] Active Range of Motion (AROM) Comments: WFLs General Strength Comments: grossly 3/5 to 3+/5  Care Tool Care Tool Bed Mobility Roll left and right activity   Roll left and  right assist level: Supervision/Verbal cueing    Sit to lying activity   Sit to lying assist level: Supervision/Verbal cueing    Lying to sitting on side of bed activity   Lying to sitting on side of bed assist level: the ability to move from lying on the back to sitting on the side of the bed with no back support.: Minimal Assistance - Patient > 75%     Care Tool Transfers Sit to stand transfer   Sit to stand assist level: Minimal Assistance - Patient > 75%    Chair/bed transfer   Chair/bed transfer assist level: Minimal Assistance - Patient > 75%  Careers adviser transfer assist level: Moderate Assistance - Patient 50 - 74%      Care Tool Locomotion Ambulation   Assist level: Minimal Assistance - Patient > 75% Assistive device: No Device Max distance: 100  Walk 10 feet activity   Assist level: Minimal Assistance - Patient > 75% Assistive device: No Device   Walk 50 feet with 2 turns activity   Assist level: Minimal Assistance - Patient > 75% Assistive device: No Device  Walk 150 feet activity Walk 150 feet activity did not occur: Safety/medical concerns      Walk 10 feet on uneven surfaces activity   Assist level: Moderate Assistance - Patient - 50 - 74% Assistive device: Hand held assist  Stairs   Assist level: Moderate Assistance - Patient - 50 - 74% Stairs assistive device: 1 hand rail;2 hand rails Max number of stairs: 8  Walk up/down 1 step activity   Walk up/down 1 step (curb) assist level: Moderate Assistance - Patient - 50 - 74% Walk up/down 1 step or curb assistive device: No device  Walk up/down 4 steps activity   Walk up/down 4 steps assist level: Moderate Assistance - Patient - 50 - 74% Walk up/down 4 steps assistive device: 1 hand rail;2 hand rails  Walk up/down 12 steps activity Walk up/down 12 steps activity did not occur: Safety/medical concerns      Pick up small objects from floor   Pick up small object from the floor assist level: Moderate  Assistance - Patient 50 - 74% Pick up small object from the floor assistive device: no device.  Wheelchair   Type of Wheelchair: Manual   Wheelchair assist level: Dependent - Patient 0% Max wheelchair distance: 150  Wheel 50 feet with 2 turns activity   Assist Level: Dependent - Patient 0%  Wheel 150 feet activity   Assist Level: Dependent - Patient 0%    Refer to Care Plan for Long Term Goals  SHORT TERM GOAL WEEK 1 PT Short Term Goal 1 (Week 1): STGs = LTGs  Recommendations for other services: None   Skilled Therapeutic Intervention PT evaluation completed and treatment plan initiated. Pt performed multiple sit to stand transfers and stand pivot transfers with min A and verbal cues without assistive device. Pt ambulated about 20 feet with min A and able to step up a curb with min to mod A and verbal cues. Pt required increased assistance with one rail performing stairs due to decrease vision. Pt returned to room after treatment and left sitting up in bed with bed alarm and all needs within reach.    Discharge Criteria: Patient will be discharged from PT if patient refuses treatment 3 consecutive times without medical reason, if treatment goals not met, if there is a change in medical status, if patient makes no progress towards goals or if patient is discharged from hospital.  The above assessment, treatment plan, treatment alternatives and goals were discussed and mutually agreed upon: by patient  Tilton Fontan 02/20/2024, 12:47 PM

## 2024-02-20 NOTE — Evaluation (Signed)
 Occupational Therapy Assessment and Plan  Patient Details  Name: Alison Lopez MRN: [id number] Date of Birth: 1956/08/25  OT Diagnosis: cognitive deficits, hemiplegia affecting dominant side, and muscle weakness (generalized) Rehab Potential: Rehab Potential (ACUTE ONLY): Good ELOS: 12-14 days   Today's Date: 02/20/2024 OT Individual Time: 1300-1400 OT Individual Time Calculation (min): 60 min     Hospital Problem: Principal Problem:   SDH (subdural hematoma) (HCC)   Past Medical History:  Past Medical History:  Diagnosis Date   Anxiety    Arthritis    Collagen vascular disease (HCC)    COPD (chronic obstructive pulmonary disease) (HCC)    DDD (degenerative disc disease), lumbar    Dementia (HCC)    Depression    Diabetes mellitus without complication (HCC)    DVT (deep venous thrombosis) (HCC)    Fibromyalgia    GERD (gastroesophageal reflux disease)    Graves' disease with exophthalmos    HLD (hyperlipidemia)    Hypertension    Hypothyroidism    Lupus    Osteoporosis    Past Surgical History:  Past Surgical History:  Procedure Laterality Date   ABDOMINAL HYSTERECTOMY     BURR HOLE Bilateral 01/30/2024   Procedure: CREATION, CRANIAL BURR HOLE, SUBDURAL HEMATOMA EVACUATIOIN;  Surgeon: Alison Munch, MD;  Location: [facility] ORS;  Service: Neurosurgery;  Laterality: Bilateral;  need ventriculostomy x 2   COLONOSCOPY WITH PROPOFOL N/A 06/11/2021   Procedure: COLONOSCOPY WITH PROPOFOL;  Surgeon: Alison Darling, MD;  Location: [facility] ENDOSCOPY;  Service: Endoscopy;  Laterality: N/A;  prefers afternoon Eliquis   COLONOSCOPY WITH PROPOFOL N/A 12/19/2022   Procedure: COLONOSCOPY WITH PROPOFOL;  Surgeon: Alison Darling, MD;  Location: [facility] ENDOSCOPY;  Service: Endoscopy;  Laterality: N/A;   ESOPHAGOGASTRODUODENOSCOPY N/A 06/11/2021   Procedure: ESOPHAGOGASTRODUODENOSCOPY (EGD);  Surgeon: Alison Darling, MD;  Location: [facility] ENDOSCOPY;  Service: Endoscopy;  Laterality:  N/A;   EYE SURGERY Right    IVC FILTER REMOVAL N/A 07/21/2019   Procedure: IVC FILTER REMOVAL;  Surgeon: Alison College, MD;  Location: [facility] INVASIVE CV LAB;  Service: Cardiovascular;  Laterality: N/A;   LOWER EXTREMITY INTERVENTION Right 04/22/2019   Procedure: IVC Filter Insertion with Right Lower Extremity Venous Lysis;  Surgeon: Alison College, MD;  Location: [facility] INVASIVE CV LAB;  Service: Cardiovascular;  Laterality: Right;   OOPHORECTOMY      Assessment & Plan Clinical Impression: Patient is a 68 y.o. year old female HPI: Alison Lopez is a 68 year old right-handed female with history of DVT maintained on Eliquis, dementia maintained on Aricept, chronic thrombocytopenia, antiphospholipid antibody syndrome followed by hematology services, B12 deficiency, hypertension, diabetes mellitus retinopathy and legally blind, quit smoking 23 years ago.  Per chart review she lives with her children.  4 steps to entry of home.  Ambulates household distances without assistive device.  Presented 01/29/2024 to [facility] with altered mental status, right side weakness and urinary/fecal incontinence.  Family denied any recent fall or trauma.  Cranial CT scan positive for bilateral mixed density subdural hematoma somewhat biconvex with a 17 mm maximum thickness on the left and 14 mm maximum thickness on the right.  Subsequent mass effect on both hemispheres although no significant midline shift.  Basilar cisterns remain patent.  No skull fracture or other acute intracranial abnormality.  Admission chemistries unremarkable except glucose 135, alcohol negative, platelets 52,000.  Underwent bilateral frontal burr hole for drainage of subdural hematoma 01/30/2024 per Dr. Jeris Lopez.  Latest follow-up cranial CT scan 01/31/2024  showed postoperative substantial decrease in bilateral subdural hematomas.  Residual blood up to 11 mm on the left, 4-5 mm on the right.  Significantly regressed intracranial  mass effect.  Residual rightward midline shift was now 1-2 mm.  Therapy evaluations completed and patient was admitted to inpatient rehab services 02/08/2024.  Patient with slow progressive gains while on rehab services.  Noted on 02/15/2024 patient with increasing altered mental status with cranial CT scan completed showing slightly increased mixed attenuation subdural collection over the left cerebral convexity measuring up to 13 mm, previously 11 mm.  New focus of acute blood products posteriorly over the left parietal lobe measuring up to 5 mm in thickness.  Additional acute blood products of the posterior left occipital lobe measuring up to 8 mm in thickness.  Dr. Mont Lopez of neurosurgery consulted in regards and scans reviewed patient was discharged to acute care services for ongoing monitoring and supportive care nonoperative 02/16/2024 with repeat scan completed again showing bilateral left greater than right mixed density subdural hematoma stable from previous scan left side of 10 mm and smaller on the right 4-5 mm.  No significant intracranial mass effect.  No new intracranial abnormality.  Hematology continue to follow for antiphospholipid syndrome history of DVT thrombocytopenia and recommends holding anticoagulation for at least 2 weeks given SDH.  Therapies have been resumed and monitored.  Patient is admitted back to inpatient rehab services to resume comprehensive rehab therapies.   Patient transferred to CIR on 02/19/2024 .    Patient currently requires total with basic self-care skills and min to mod  A for functional mobility  secondary to muscle weakness, decreased cardiorespiratoy endurance, impaired timing and sequencing, unbalanced muscle activation, motor apraxia, decreased coordination, and decreased motor planning, decreased visual perceptual skills, decreased midline orientation and decreased attention to right, decreased attention, decreased awareness, decreased problem solving, decreased  safety awareness, decreased memory, and delayed processing, and decreased sitting balance, decreased standing balance, decreased postural control, hemiplegia, and decreased balance strategies.  Prior to hospitalization, patient could complete ADLs  with  mod I to supervision .  Patient will benefit from skilled intervention to decrease level of assist with basic self-care skills and increase independence with basic self-care skills prior to discharge home with care partner.  Anticipate patient will require 24 hour supervision and minimal physical assistance and follow up home health.  OT - End of Session Activity Tolerance: Tolerates 10 - 20 min activity with multiple rests Endurance Deficit: Yes Endurance Deficit Description: generalized deconditioning OT Assessment Rehab Potential (ACUTE ONLY): Good OT Barriers to Discharge: Decreased caregiver support OT Patient demonstrates impairments in the following area(s): Balance;Sensory;Cognition;Endurance;Motor;Perception;Safety;Vision OT Basic ADL's Functional Problem(s): Eating;Grooming;Bathing;Dressing;Toileting OT Transfers Functional Problem(s): Toilet;Tub/Shower OT Additional Impairment(s): Fuctional Use of Upper Extremity OT Plan OT Intensity: Minimum of 1-2 x/day, 45 to 90 minutes OT Frequency: 5 out of 7 days OT Duration/Estimated Length of Stay: 12-14 days OT Treatment/Interventions: Metallurgist training;Patient/family education;Therapeutic Activities;Psychosocial support;Therapeutic Exercise;DME/adaptive equipment instruction;Wheelchair propulsion/positioning;Cognitive remediation/compensation;Community reintegration;Functional mobility training;Self Care/advanced ADL retraining;UE/LE Strength taining/ROM;Discharge planning;UE/LE Coordination activities;Disease mangement/prevention;Neuromuscular re-education;Visual/perceptual remediation/compensation OT Self Feeding Anticipated Outcome(s): S OT Basic Self-Care Anticipated  Outcome(s): min guard OT Toileting Anticipated Outcome(s): min guard OT Bathroom Transfers Anticipated Outcome(s): min guard OT Recommendation Recommendations for Other Services: Neuropsych consult Patient destination: Home Follow Up Recommendations: Home health OT Equipment Recommended: To be determined   OT Evaluation Precautions/Restrictions  Precautions Precautions: Fall Recall of Precautions/Restrictions: Impaired Precaution/Restrictions Comments: legally blind Restrictions Weight Bearing Restrictions Per Provider Order: No General Chart  Reviewed: Yes Family/Caregiver Present: Yes (daughter came at end of session) Vital Signs Therapy Vitals Temp: 98.6 F (37 C) Pulse Rate: (!) 59 Resp: 16 BP: 137/69 Patient Position (if appropriate): Lying Oxygen Therapy SpO2: 96 % O2 Device: Room Air Pain   Home Living/Prior Functioning Home Living Family/patient expects to be discharged to:: Private residence Living Arrangements: Children, Other relatives Available Help at Discharge: Family, Available PRN/intermittently Type of Home: Mobile home Home Access: Stairs to enter Entrance Stairs-Number of Steps: 6 Entrance Stairs-Rails: Right Home Layout: One level Bathroom Toilet: Standard Additional Comments: Pt states she lives with her daughter.  Lives With: Daughter Prior Function Level of Independence: Independent with transfers, Independent with gait  Able to Take Stairs?: Yes Vision Baseline Vision/History: 2 Legally blind Ability to See in Adequate Light: 4 Severely impaired Patient Visual Report: Peripheral vision impairment Alignment/Gaze Preference: Gaze left Perception  Perception: Impaired Praxis Praxis: Impaired Praxis Impairment Details: Motor planning Cognition Cognition Overall Cognitive Status: Impaired/Different from baseline Arousal/Alertness: Awake/alert Orientation Level: Person;Place;Situation Person: Oriented Place: Oriented Situation:  Oriented Memory: Impaired Memory Impairment: Decreased recall of new information Attention: Focused;Sustained Focused Attention: Appears intact Sustained Attention: Impaired Sustained Attention Impairment: Verbal complex;Verbal basic Awareness: Impaired Awareness Impairment: Intellectual impairment Problem Solving: Impaired Problem Solving Impairment: Verbal basic;Functional basic Executive Function:  (all impaired) Organizing: Impaired Organizing Impairment: Functional basic Safety/Judgment: Impaired Comments: difficult to assess given language deficits as well Brief Interview for Mental Status (BIMS) Repetition of Three Words (First Attempt): 3 Temporal Orientation: Year: Correct Temporal Orientation: Month: Missed by more than 1 month Temporal Orientation: Day: Incorrect Recall: "Sock": No, could not recall Recall: "Blue": No, could not recall Recall: "Bed": No, could not recall BIMS Summary Score: 6 Sensation Sensation Light Touch: Impaired Detail Light Touch Impaired Details: Impaired RUE;Impaired RLE Proprioception: Impaired Detail Proprioception Impaired Details: Impaired RUE;Impaired RLE Coordination Gross Motor Movements are Fluid and Coordinated: No Fine Motor Movements are Fluid and Coordinated: No Coordination and Movement Description: R hemiparesis with inattention Finger Nose Finger Test: Severely impaired- combo of visual deficits and R inattention Motor  Motor Motor: Hemiplegia;Abnormal postural alignment and control;Motor apraxia  Trunk/Postural Assessment  Cervical Assessment Cervical Assessment: Exceptions to [facility] (forward head posture) Thoracic Assessment Thoracic Assessment: Exceptions to [facility] (rounded shoulders) Lumbar Assessment Lumbar Assessment: Exceptions to [facility] (posterior pelvic tilt) Postural Control Postural Control: Deficits on evaluation Trunk Control: leans to the right Righting Reactions: delayed Protective Responses: delayed   Balance Balance Balance Assessed: Yes Static Sitting Balance Static Sitting - Balance Support: Bilateral upper extremity supported;Feet supported Static Sitting - Level of Assistance: 5: Stand by assistance Dynamic Sitting Balance Dynamic Sitting - Balance Support: No upper extremity supported;Feet supported Dynamic Sitting - Level of Assistance: 5: Stand by assistance;4: Min assist Static Standing Balance Static Standing - Balance Support: Right upper extremity supported Static Standing - Level of Assistance: 4: Min assist Dynamic Standing Balance Dynamic Standing - Balance Support: Right upper extremity supported Dynamic Standing - Level of Assistance: 4: Min assist;3: Mod assist Extremity/Trunk Assessment RUE Assessment RUE Assessment: Exceptions to [facility] General Strength Comments: R inattention. arthritic changes with ulnar drift. Strength 3+.5. Apraxia  RUE RUE Body System: Neuro Brunstrum levels for arm and hand: Arm Brunstrum level for arm: Stage V Relative Independence from Synergy Brunstrum level for hand: Stage V Independence from basic synergies LUE Assessment General Strength Comments: arthritic changes with ulnar drift. Strength 3+.5  Care Tool Care Tool Self Care Eating        Oral Care  Oral Care Assist Level: Total assistance - Patient < 25%    Bathing   Body parts bathed by patient: Right arm;Chest;Abdomen;Left upper leg;Right upper leg Body parts bathed by helper: Right lower leg;Left lower leg;Buttocks;Front perineal area;Left arm;Face   Assist Level: Maximal Assistance - Patient 24 - 49%    Upper Body Dressing(including orthotics)   What is the patient wearing?: Dress   Assist Level: Total Assistance - Patient < 25%    Lower Body Dressing (excluding footwear)   What is the patient wearing?: Incontinence brief Assist for lower body dressing: Total Assistance - Patient < 25%    Putting on/Taking off footwear   What is the patient wearing?:  Socks;Shoes Assist for footwear: Total Assistance - Patient < 25%       Care Tool Toileting Toileting activity   Assist for toileting: Maximal Assistance - Patient 25 - 49%     Care Tool Bed Mobility Roll left and right activity   Roll left and right assist level: Supervision/Verbal cueing    Sit to lying activity   Sit to lying assist level: Minimal Assistance - Patient > 75%    Lying to sitting on side of bed activity   Lying to sitting on side of bed assist level: the ability to move from lying on the back to sitting on the side of the bed with no back support.: Minimal Assistance - Patient > 75%     Care Tool Transfers Sit to stand transfer   Sit to stand assist level: Minimal Assistance - Patient > 75%    Chair/bed transfer   Chair/bed transfer assist level: Minimal Assistance - Patient > 75%     Toilet transfer   Assist Level: Minimal Assistance - Patient > 75%     Care Tool Cognition  Expression of Ideas and Wants Expression of Ideas and Wants: 2. Frequent difficulty - frequently exhibits difficulty with expressing needs and ideas  Understanding Verbal and Non-Verbal Content Understanding Verbal and Non-Verbal Content: 2. Sometimes understands - understands only basic conversations or simple, direct phrases. Frequently requires cues to understand   Memory/Recall Ability Memory/Recall Ability : That he or she is in a hospital/hospital unit   Refer to Care Plan for Long Term Goals  SHORT TERM GOAL WEEK 1 OT Short Term Goal 1 (Week 1): Pt will don shirt with mod A OT Short Term Goal 2 (Week 1): Pt will don pants with mod A OT Short Term Goal 3 (Week 1): Pt will complete stand pivot to the toilet with min A OT Short Term Goal 4 (Week 1): Pt will attend to right side of body in ADL task with mod cues  Recommendations for other services: Neuropsych   Skilled Therapeutic Intervention 1:1 Ot eval initatated with OT purpose, role and goals discussed with pt however not  able to retain and recall this; daughter came later in session and discussed with her. Pts daughter reported that PTA she was able to take herself to the bathroom and perform toileting (but didn't do very through with hygiene). Daughter would assist with all IADLs as well as helping showering and dressing.   Pt in bed when arrived and at first wanted to stay in bed due to ongoing fatigue. However was easily able to encourage her to get up. Pt ambulated with min to mod A into the bathroom to shower. Pt with significant inattention to right side of her body as well with walking pt listed to the right with no awareness  of running into objects. Pt also didn't not engage her right hand in tasks without [facility] prompting and was not able to perform task successfully with it. Decr sensation and prpperception noted.   Apraxia noted with use of left hand at times as well. Pt required A with all tasks today. Anticipate pt will needs minguard for ADLs at home.    ADL ADL Grooming: Maximal assistance Where Assessed-Grooming: Sitting at sink Upper Body Bathing: Maximal assistance Where Assessed-Upper Body Bathing: Shower Lower Body Bathing: Maximal assistance Where Assessed-Lower Body Bathing: Shower Upper Body Dressing: Maximal assistance Where Assessed-Upper Body Dressing: Sitting at sink Lower Body Dressing: Dependent Where Assessed-Lower Body Dressing: Sitting at sink Toileting: Dependent Toilet Transfer: Minimal assistance Toilet Transfer Method: Event organiser: Moderate cueing;Minimal assistance Film/video editor Method: Ambulating Mobility  Bed Mobility Supine to Sit: Minimal Assistance - Patient > 75%;Contact Guard/Touching assist Sit to Supine: Contact Guard/Touching assist Transfers Sit to Stand: Minimal Assistance - Patient > 75% Stand to Sit: Minimal Assistance - Patient > 75%   Discharge Criteria: Patient will be discharged from OT if patient refuses treatment 3  consecutive times without medical reason, if treatment goals not met, if there is a change in medical status, if patient makes no progress towards goals or if patient is discharged from hospital.  The above assessment, treatment plan, treatment alternatives and goals were discussed and mutually agreed upon: by patient  Oran Bimler 02/20/2024, 4:38 PM

## 2024-02-21 LAB — GLUCOSE, CAPILLARY
Glucose-Capillary: 89 mg/dL (ref 70–99)
Glucose-Capillary: 117 mg/dL — ABNORMAL HIGH (ref 70–99)
Glucose-Capillary: 127 mg/dL — ABNORMAL HIGH (ref 70–99)
Glucose-Capillary: 121 mg/dL — ABNORMAL HIGH (ref 70–99)

## 2024-02-21 NOTE — Progress Notes (Signed)
 PROGRESS NOTE   Subjective/Complaints: No new complaints this morning Patient's chart reviewed- No issues reported overnight Vitals signs stable except for diastolic hypotension  ROS: +impaired cognition, denies pain   Objective:   No results found. No results for input(s): "WBC", "HGB", "HCT", "PLT" in the last 72 hours. No results for input(s): "NA", "K", "CL", "CO2", "GLUCOSE", "BUN", "CREATININE", "CALCIUM" in the last 72 hours.   Intake/Output Summary (Last 24 hours) at 02/21/2024 1529 Last data filed at 02/21/2024 1355 Gross per 24 hour  Intake 820 ml  Output --  Net 820 ml        Physical Exam: Vital Signs Blood pressure (!) 125/51, pulse 67, temperature 99 F (37.2 C), temperature source Oral, resp. rate 16, height 4\' 9"  (1.448 m), weight 49.7 kg, SpO2 100%. Gen: no distress, normal appearing HEENT: oral mucosa pink and moist, NCAT Cardio: Reg rate Chest: normal effort, normal rate of breathing Abd: soft, non-distended Ext: no edema Psych: pleasant, normal affect Skin: intact Neurologic: Awake, alert, oriented to self, place and time with some cues.  Follows 3 out of 3 simple commands.  Obvious processing delays and memory deficits.  Cranial nerves II through XII grossly intact.  No sensory deficits.  Motor 4 out of 5 throughout bilateral upper and lower extremities.  Reflexes intact.  No Hoffmann's, Babinski.  No obvious tone. Stable 4/13     Assessment/Plan: 1. Functional deficits which require 3+ hours per day of interdisciplinary therapy in a comprehensive inpatient rehab setting. Physiatrist is providing close team supervision and 24 hour management of active medical problems listed below. Physiatrist and rehab team continue to assess barriers to discharge/monitor patient progress toward functional and medical goals  Care Tool:  Bathing    Body parts bathed by patient: Right arm, Chest, Abdomen,  Left upper leg, Right upper leg   Body parts bathed by helper: Right lower leg, Left lower leg, Buttocks, Front perineal area, Left arm, Face     Bathing assist Assist Level: Maximal Assistance - Patient 24 - 49%     Upper Body Dressing/Undressing Upper body dressing   What is the patient wearing?: Dress    Upper body assist Assist Level: Total Assistance - Patient < 25%    Lower Body Dressing/Undressing Lower body dressing      What is the patient wearing?: Incontinence brief     Lower body assist Assist for lower body dressing: Total Assistance - Patient < 25%     Toileting Toileting    Toileting assist Assist for toileting: Maximal Assistance - Patient 25 - 49%     Transfers Chair/bed transfer  Transfers assist     Chair/bed transfer assist level: Minimal Assistance - Patient > 75%     Locomotion Ambulation   Ambulation assist      Assist level: Minimal Assistance - Patient > 75% Assistive device: No Device Max distance: 100   Walk 10 feet activity   Assist     Assist level: Minimal Assistance - Patient > 75% Assistive device: No Device   Walk 50 feet activity   Assist    Assist level: Minimal Assistance - Patient > 75% Assistive device: No Device  Walk 150 feet activity   Assist Walk 150 feet activity did not occur: Safety/medical concerns         Walk 10 feet on uneven surface  activity   Assist     Assist level: Moderate Assistance - Patient - 50 - 74% Assistive device: Hand held assist   Wheelchair     Assist   Type of Wheelchair: Manual    Wheelchair assist level: Dependent - Patient 0% Max wheelchair distance: 150    Wheelchair 50 feet with 2 turns activity    Assist        Assist Level: Dependent - Patient 0%   Wheelchair 150 feet activity     Assist      Assist Level: Dependent - Patient 0%   Blood pressure (!) 125/51, pulse 67, temperature 99 F (37.2 C), temperature source Oral,  resp. rate 16, height 4\' 9"  (1.448 m), weight 49.7 kg, SpO2 100%.  Medical Problem List and Plan: 1. 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  Status post bur hole 01/30/2024             -patient may shower             -ELOS/Goals: 10 to 14 days, supervision PT/OT, min assist SLP             -Stable to admit to inpatient rehab 2.  Antithrombotics: -DVT/anticoagulation:  Mechanical: Antiembolism stockings, thigh (TED hose) Bilateral lower extremities             -antiplatelet therapy: N/A 3. Pain Management: Tylenol as needed 4. Mood/Behavior/Sleep: continue Aricept 10 mg nightly, Zoloft 25 mg daily, melatonin 5 mg nightly              -antipsychotic agents: N/A 5. Neuropsych/cognition: This patient is not capable of making decisions on her own behalf.  6. Skin/Wound Care: Routine skin checks 7. Fluids/Electrolytes/Nutrition: Routine in and outs with follow-up chemistries  8.  Antiphospholipid syndrome/history of DVT/thrombocytopenia secondary to antiphospholipid syndrome.  Followed by hematology services.  Chronic ELIQUIS REMAINS ON HOLD FOR AT LEAST TWO WEEKS GIVEN SDH WITH FOLLOW UP CT HEAD.  9.  Hypothyroidism. continue Synthroid  10.  Hyperlipidemia.  continue Crestor  11.  Diabetes mellitus retinopathy/legally blind.  Latest hemoglobin A1c 4.8.  SSI  12. Bradycardia: continue to monitor HR TID  LOS: 2 days A FACE TO FACE EVALUATION WAS PERFORMED  Keven Pel Keyaria Lawson 02/21/2024, 3:29 PM

## 2024-02-21 NOTE — Discharge Summary (Signed)
 Physician Discharge Summary  Patient ID: Alison Lopez MRN: [id number] DOB/AGE: Jun 05, 1956 68 y.o.  Admit date: 02/19/2024 Discharge date: 03/04/2024  Discharge Diagnoses:  Principal Problem:   SDH (subdural hematoma) (HCC) Dementia Antiphospholipid syndrome/history of DVT/thrombocytopenia Hypothyroidism Hyperlipidemia Diabetes mellitus with retinopathy/legally blind UTI  Discharged Condition: Stable  Significant Diagnostic Studies: CT HEAD WO CONTRAST ( ) Result Date: 02/16/2024 CLINICAL DATA:  68 year old female with left subdural hematoma. EXAM: CT HEAD WITHOUT CONTRAST TECHNIQUE: Contiguous axial images were obtained from the base of the skull through the vertex without intravenous contrast. RADIATION DOSE REDUCTION: This exam was performed according to the departmental dose-optimization program which includes automated exposure control, adjustment of the mA and/or kV according to patient size and/or use of iterative reconstruction technique. COMPARISON:  Head CT yesterday and earlier. FINDINGS: Brain: Mixed density left side subdural hematoma redemonstrated. Thickness is 9-10 mm on coronal images and not changed from yesterday. Minority of hyperdense blood products not significantly changed. Trace subdural space gas also again noted on coronal image 15. Smaller mixed density right side subdural hematoma which is primarily limited to the posterior convexity is 4-5 mm, stable on coronal image 23. No significant midline shift. No significant mass effect on the ventricles. Basilar cisterns remain patent. No ventriculomegaly. No new intracranial hemorrhage identified. Stable gray-white matter differentiation throughout the brain. Extensive chronic occipital and parietal lobe encephalomalacia, small chronic cerebellar infarcts. Vascular: Calcified atherosclerosis at the skull base. No suspicious intracranial vascular hyperdensity. Skull: Stable previous vertex burr holes. Sinuses/Orbits:  Visualized paranasal sinuses and mastoids are stable and well aerated. Other: No acute orbit or scalp soft tissue finding. IMPRESSION: 1. Bilateral, left greater than right mixed density Subdural Hematomas are stable from yesterday. Left side up to 10 mm, and smaller on the Right 4-5 mm. 2. No significant intracranial mass effect. No new intracranial abnormality. 3. Advanced Chronic ischemic disease. Electronically Signed   By: Marlise Simpers M.D.   On: 02/16/2024 13:35   CT HEAD WO CONTRAST ( ) Addendum Date: 02/15/2024 ADDENDUM REPORT: 02/15/2024 18:40 ADDENDUM: These results were called by telephone at the time of interpretation on 02/15/2024 at 6:36 pm to provider Jean Michaelis, PA, who verbally acknowledged these results. Electronically Signed   By: Denny Flack M.D.   On: 02/15/2024 18:40   Result Date: 02/15/2024 CLINICAL DATA:  Subdural hematoma, increased confusion and inattention on exam. EXAM: CT HEAD WITHOUT CONTRAST TECHNIQUE: Contiguous axial images were obtained from the base of the skull through the vertex without intravenous contrast. RADIATION DOSE REDUCTION: This exam was performed according to the departmental dose-optimization program which includes automated exposure control, adjustment of the mA and/or kV according to patient size and/or use of iterative reconstruction technique. COMPARISON:  CT head 01/31/2024 FINDINGS: Brain: Redemonstrated postsurgical changes of both subdural spaces with interval removal of surgical drains. There is a mixed attenuation subdural collection over the left cerebral convexity which measures up to 13 mm in thickness, previously measuring 11 mm. Associated mass effect on the parenchyma is similar to prior. There are additional areas of acute blood products posteriorly over the left parietal lobe measuring up to 5 mm in maximum coronal thickness. Additional blood products over the posterior left occipital lobe adjacent to the transverse sinus measuring up to 8 mm in  thickness. Minimal residual subdural collection over the right parietal lobe near the vertex. Approximately 2 mm rightward midline shift is similar to prior. Interval resolution of pneumocephalus. Similar chronic microvascular ischemic changes. 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  Small remote infarcts in the left cerebellum. Generalized parenchymal volume loss. The basilar cisterns are patent. Vascular: Atherosclerosis of the carotid siphons. No hyperdense vessel. Skull: Normal. Negative for fracture or focal lesion. Sinuses/Orbits: Orbits are symmetric. Mild mucosal thickening in the ethmoid sinuses and left sphenoid sinus. Other: Mastoid air cells are clear. IMPRESSION: Slightly increased mixed attenuation subdural collection over the left cerebral convexity measuring up to 13 mm, previously 11 mm. New focus of acute blood products posteriorly over the left parietal lobe measuring up to 5 mm in thickness. Additional acute blood products over the posterior left occipital lobe measuring up to 8 mm in thickness. Overall similar mass effect with approximately 2 mm rightward midline shift. Chronic ischemic disease as above. Electronically Signed: By: Denny Flack M.D. On: 02/15/2024 18:19   DG Chest 2 View Result Date: 02/13/2024 CLINICAL DATA:  Fever EXAM: CHEST - 2 VIEW COMPARISON:  02/12/2024 FINDINGS: No acute airspace disease. Suspect trace left pleural effusion or pleural thickening. Previously noted vague right lung base opacity is felt secondary to artifact. Normal cardiomediastinal silhouette with aortic atherosclerosis. IMPRESSION: No focal pulmonary opacity. Suspect trace left pleural effusion or pleural thickening. Electronically Signed   By: Esmeralda Hedge M.D.   On: 02/13/2024 17:47   DG CHEST PORT 1 VIEW Result Date: 02/12/2024 CLINICAL DATA:  Fever EXAM: PORTABLE CHEST 1 VIEW COMPARISON:  CT 04/17/2021 FINDINGS: Scarring or atelectasis at the left base. Vague right lung base opacity. Normal  cardiac size with aortic atherosclerosis. No pneumothorax IMPRESSION: Vague right lung base opacity, question soft tissue artifact versus focus of airspace disease. Suggest two-view chest radiograph follow-up when clinically feasible Electronically Signed   By: Esmeralda Hedge M.D.   On: 02/12/2024 22:32   CT HEAD WO CONTRAST ( ) Result Date: 01/31/2024 CLINICAL DATA:  68 year old female with altered mental status and bilateral subdural hematomas at presentation. Postoperative now bilateral subdural evacuation. EXAM: CT HEAD WITHOUT CONTRAST TECHNIQUE: Contiguous axial images were obtained from the base of the skull through the vertex without intravenous contrast. RADIATION DOSE REDUCTION: This exam was performed according to the departmental dose-optimization program which includes automated exposure control, adjustment of the mA and/or kV according to patient size and/or use of iterative reconstruction technique. COMPARISON:  Head CT 01/29/2024. FINDINGS: Brain: Postoperative changes to both subdural spaces with bilateral subdural drains in place. Associated pneumocephalus. Decreased volume of right side subdural hematoma, residual now only 4-5 mm at most levels (previously up to 14 mm). Decreased volume of left side subdural hematoma, more mixed density now and residual up to maximum thickness of 11 mm (previously 17 mm). Subsequent decreased intracranial mass effect and improved ventricle size and configuration. Mild rightward midline shift now 1-2 mm (coronal image 35). Small volume left para falcine subdural blood and pneumocephalus also. Basilar cisterns are patent and mildly improved. No IVH or new areas of intracranial hemorrhage. Chronic parietal 0 and occipital lobe encephalomalacia more pronounced on the right. Small chronic left cerebellar infarcts. No cortically based acute infarct identified. Vascular: Calcified atherosclerosis at the skull base. No suspicious intracranial vascular hyperdensity.  Skull: New bilateral vertex burr holes. Sinuses/Orbits: Visualized paranasal sinuses and mastoids are stable and well aerated. Other: Postoperative changes now to the bilateral scalp vertex with percutaneous drains and skin staples in place. Similar leftward gaze. Postoperative changes to the right globe. IMPRESSION: 1. Postoperative substantial decrease in bilateral subdural hematomas with bilateral subdural drains in place. Residual blood up to 11 mm on the Left, 4-5 mm on  the Right. 2. Significantly regressed intracranial mass effect. Residual rightward midline shift now 1-2 mm. 3. No new intracranial abnormality. Chronic ischemic disease. Electronically Signed   By: Marlise Simpers M.D.   On: 01/31/2024 05:16    Labs:  Basic Metabolic Panel: Recent Labs  Lab 02/22/24 0524 02/24/24 0531 02/26/24 0933  NA 140 138 138  K 4.8 4.1 4.0  CL 107 111 107  CO2 26 24 20*  GLUCOSE 100* 93 130*  BUN 18 17 17   CREATININE 1.08* 0.94 1.00  CALCIUM  9.0 8.5* 8.7*    CBC: Recent Labs  Lab 02/22/24 0524 02/26/24 0933  WBC 6.0 6.5  NEUTROABS 4.1 4.5  HGB 9.7* 10.5*  HCT 29.0* 32.7*  MCV 90.3 91.6  PLT 102* 84*    CBG: Recent Labs  Lab 02/27/24 1137 02/27/24 1632 02/27/24 2105 02/28/24 0640 02/28/24 2048  GLUCAP 107* 120* 122* 85 110*   Family history.  Mother with stomach cancer father with heart attack.  Denies any colon cancer esophageal cancer or rectal cancer  Brief HPI:   PRECILLA PURNELL is a 68 y.o. right-handed female with history significant for DVT maintained on Eliquis , dementia maintained on Aricept , chronic thrombocytopenia, antiphospholipid antibody syndrome followed by hematology services B12 deficiency hypertension diabetes mellitus retinopathy legally blind quit smoking 23 years ago.  Per chart review she lives with her children 4 steps to entry of home.  Ambulates household distances without assistive device.  Presented 01/29/2024 to [facility] with altered  mental status right side weakness and urinary fecal incontinence.  Family denied any recent fall or trauma.  Cranial CT scan showed positive for bilateral mixed density subdural hematoma somewhat biconvex with a 17 mm maximum thickness on the left and 14 mm maximum thickness on the right.  Subsequent mass effect both hemispheres although no significant midline shift.  Basilar cisterns remained patent.  No skull fracture or acute abnormality.  Admission chemistries unremarkable except glucose 135 alcohol negative platelets 52,000.  Underwent bilateral frontal burr hole for drainage of subdural hematoma 01/30/2024 per Dr. Jeris Montes.  Latest follow-up cranial CT scan 01/31/2024 showed postoperative substantial decrease in bilateral subdural hematomas.  Residual blood up to 11 mm on the left, 4-5 mm on the right.  Significantly regressed intracranial mass effect.  Residual rightward midline shift now 1-2 mm.  Therapy is completed patient was admitted to inpatient rehab services 02/08/2024.  Patient with slow progressive gains while on rehab services.  Noted on 02/15/2024 patient with increasing altered mental status with cranial CT scan completed showing slightly increased mixed attenuation subdural collection over the left cerebral convexity measuring up to 13 mm, previously 11 mm.  New focus of acute blood products posteriorly over the left parietal lobe measuring up to 5 mm in thickness.  Additional acute blood products of the posterior left occipital lobe measuring up to 8 mm in thickness.  Neurosurgery consulted in regards and scan completed reviewed discharged to acute care services for ongoing monitoring and supportive care nonoperative 02/16/2024 with repeat scan completed again showing bilateral left greater than right mixed density subdural hematoma stable from previous scan left side of 10 mm and smaller on the right 4-5 mm.  No significant intracranial mass effect.  No new intracranial abnormality.  Hematology  continue to follow for antiphospholipid syndrome history of DVT thrombocytopenia recommending holding anticoagulation for at least 2 weeks given SDH.  Therapy evaluations completed due to patient decreased functional mobility was admitted for a comprehensive rehab program.  Hospital Course: TAJAE MAIOLO was admitted to rehab 02/19/2024 for inpatient therapies to consist of PT, ST and OT at least three hours five days a week. Past admission physiatrist, therapy team and rehab RN have worked together to provide customized collaborative inpatient rehab.  Pertaining to patient's spontaneous bilateral left greater than right subdural hematoma status post bur hole 01/30/2024.  Repeat CT and imaging completed 03/01/2024 and reviewed by neurosurgery DrYarbrough showing mixed density subdural hematoma along the left cerebral convexity but decreased in size since 02/16/2024 now measuring 7 mm in thickness no midline shift and her chronic Eliquis  was resumed at that time..  Platelet counts monitored closely by hematology services with latest platelet counts improved 202,000.  Mood stable sedation with Zoloft  ongoing as well as the addition of Ritalin  to help patient establish better focus to complete tasks.  Synthroid  ongoing for hypothyroidism.  Crestor  for hyperlipidemia.  Blood sugars overall controlled with SSI.  Mood stabilization with Aricept  as well as Zoloft  at bedtime using melatonin to help aid in sleep.  Patient with hospital-acquired UTI grade 100,000 E. coli sensitive to Duricef initiated through 4/25.  Patient has no dysuria or hematuria.   Blood pressures were monitored on TID basis and remained controlled and monitored  Diabetes has been monitored with ac/hs CBG checks and SSI was use prn for tighter BS control.    Rehab course: During patient's stay in rehab weekly team conferences were held to monitor patient's progress, set goals and discuss barriers to discharge. At admission, patient required  contact-guard sit to stand contact-guard step pivot transfers  Physical exam.  Blood pressure 113/63 pulse 77 temperature 98.3 respirations 14 oxygen saturations 96% room air Constitutional.  No acute distress HEENT Head.  Normocephalic and atraumatic Eyes.  Pupils round and reactive to light no discharge without nystagmus Neck.  Supple nontender no JVD without thyromegaly Cardiac regular rate and rhythm without any extra sounds or murmur heard Abdomen.  Soft nontender positive bowel sounds without rebound Respiratory effort normal no respiratory distress without wheeze Skin.  No evidence of breakdown or rash Neurologic.  Awake alert oriented to self place and time with some cues.  Follow-up 3 out of 3 simple commands.  Obvious processing delay and memory deficits.  Cranial nerves II through XII intact.  No sensory deficits.  Motor 4 out of 5 throughout bilateral upper and lower extremities.  Reflexes intact  He/She  has had improvement in activity tolerance, balance, postural control as well as ability to compensate for deficits. He/She has had improvement in functional use RUE/LUE  and RLE/LLE as well as improvement in awareness.  Completes bed mobility with minimal assist, requires contact-guard minimal assist for seated balance.  Dons longsleeve shirt seated edge of bed with assistance.  Ambulates 175 feet contact-guard/minimal assist.  Provided with frequent rest breaks working on endurance.  Occupational Therapy working with toileting, functional mobility into the bathroom with ataxic trunk minimal assist.  Completed PERI hygiene and standing with moderate verbal cues.  She did demonstrate severe motor planning, proprioception and stereognosis deficits demonstrating poor awareness.  She was independent oriented to situation, setting, year and day of week but not month.  Consistently required cues to orient to month.  SLP introduced speech intelligibility strategies of over articulation on loud  voice.  Required intermittent cues across sessions to increase vocal intensity.  SLP addressed problem solving through training patient in use of a call button examples of utilization and identification.  Full family teaching completed plan  discharge to home       Disposition:  There are no questions and answers to display.         Diet: Soft  Special Instructions: No driving smoking or alcohol  Continue to hold Eliquis  until follow-up with neurosurgery Dr.Yarbrough  Medications at discharge 1.  Tylenol  as needed 2.  Vitamin B12 1000 mcg p.o. daily 3.  Aricept  10 mg p.o. nightly 4.  Ferrous sulfate  325 mg p.o. daily 5.  Synthroid  125 mcg p.o. daily 6.  Melatonin 5 mg p.o. nightly 7.  Crestor  20 mg p.o. daily 8.  Zoloft  25 mg p.o. daily 9.  Ritalin  10 mg twice daily 10.  Eliquis  2.5 mg twice daily  30-35 minutes were spent completing discharge summary and discharge planning  Discharge Instructions     Ambulatory referral to Neurology   Complete by: As directed    An appointment is requested in approximately: 4 weeks spontaneous bilateral left greater than right SDH   Ambulatory referral to Physical Medicine Rehab   Complete by: As directed    Moderate complexity follow up spontaneous SDH        Follow-up Information     Cherri Corns C, DO Follow up.   Specialty: Physical Medicine and Rehabilitation Why: Office to call for appointment Contact information: [address] [place] [address] [phone]         Jodeen Munch, MD Follow up.   Specialty: Neurosurgery Why: Call for appointment Contact information: [address] [place] [address] [phone]         [name], MD Follow up.   Specialty: Oncology Why: Call for appointment Contact information: [address] [place] [address] [phone]                 Signed: Sterling Eisenmenger 02/29/2024, 5:10 AM

## 2024-02-21 NOTE — Discharge Instructions (Addendum)
 Inpatient Rehab Discharge Instructions  [facility] Discharge date and time: No discharge date for patient encounter.   Activities/Precautions/ Functional Status: Activity: As tolerated Diet: Soft Wound Care: Routine skin checks Functional status:  ___ No restrictions     ___ Walk up steps independently ___ 24/7 supervision/assistance   ___ Walk up steps with assistance ___ Intermittent supervision/assistance  ___ Bathe/dress independently ___ Walk with walker     _x__ Bathe/dress with assistance ___ Walk Independently    ___ Shower independently ___ Walk with assistance    ___ Shower with assistance ___ No alcohol     ___ Return to work/school ________  Special Instructions: No driving smoking or alcohol   Continue to hold Eliquis  until follow-up with neurosurgery Dr.YARBROUGH [phone]   My questions have been answered and I understand these instructions. I will adhere to these goals and the provided educational materials after my discharge from the hospital.  Patient/Caregiver Signature _______________________________ Date __________  Clinician Signature _______________________________________ Date __________  Please bring this form and your medication list with you to all your follow-up doctor's appointments.

## 2024-02-22 LAB — COMPREHENSIVE METABOLIC PANEL WITH GFR
ALT: 14 U/L (ref 0–44)
AST: 14 U/L — ABNORMAL LOW (ref 15–41)
Albumin: 2.4 g/dL — ABNORMAL LOW (ref 3.5–5.0)
Alkaline Phosphatase: 54 U/L (ref 38–126)
Anion gap: 7 (ref 5–15)
BUN: 18 mg/dL (ref 8–23)
CO2: 26 mmol/L (ref 22–32)
Calcium: 9 mg/dL (ref 8.9–10.3)
Chloride: 107 mmol/L (ref 98–111)
Creatinine, Ser: 1.08 mg/dL — ABNORMAL HIGH (ref 0.44–1.00)
GFR, Estimated: 56 mL/min — ABNORMAL LOW (ref >60)
Glucose, Bld: 100 mg/dL — ABNORMAL HIGH (ref 70–99)
Potassium: 4.8 mmol/L (ref 3.5–5.1)
Sodium: 140 mmol/L (ref 135–145)
Total Bilirubin: 0.2 mg/dL (ref 0.0–1.2)
Total Protein: 5.9 g/dL — ABNORMAL LOW (ref 6.5–8.1)

## 2024-02-22 LAB — GLUCOSE, CAPILLARY
Glucose-Capillary: 96 mg/dL (ref 70–99)
Glucose-Capillary: 117 mg/dL — ABNORMAL HIGH (ref 70–99)
Glucose-Capillary: 118 mg/dL — ABNORMAL HIGH (ref 70–99)
Glucose-Capillary: 135 mg/dL — ABNORMAL HIGH (ref 70–99)

## 2024-02-22 LAB — CBC WITH DIFFERENTIAL/PLATELET
Abs Immature Granulocytes: 0.02 10*3/uL (ref 0.00–0.07)
Basophils Absolute: 0 10*3/uL (ref 0.0–0.1)
Basophils Relative: 1 %
Eosinophils Absolute: 0.1 10*3/uL (ref 0.0–0.5)
Eosinophils Relative: 2 %
HCT: 29 % — ABNORMAL LOW (ref 36.0–46.0)
Hemoglobin: 9.7 g/dL — ABNORMAL LOW (ref 12.0–15.0)
Immature Granulocytes: 0 %
Lymphocytes Relative: 20 %
Lymphs Abs: 1.2 10*3/uL (ref 0.7–4.0)
MCH: 30.2 pg (ref 26.0–34.0)
MCHC: 33.4 g/dL (ref 30.0–36.0)
MCV: 90.3 fL (ref 80.0–100.0)
Monocytes Absolute: 0.6 10*3/uL (ref 0.1–1.0)
Monocytes Relative: 9 %
Neutro Abs: 4.1 10*3/uL (ref 1.7–7.7)
Neutrophils Relative %: 68 %
Platelets: 102 10*3/uL — ABNORMAL LOW (ref 150–400)
RBC: 3.21 MIL/uL — ABNORMAL LOW (ref 3.87–5.11)
RDW: 13 % (ref 11.5–15.5)
WBC: 6 10*3/uL (ref 4.0–10.5)
nRBC: 0 % (ref 0.0–0.2)

## 2024-02-22 NOTE — Progress Notes (Signed)
 Occupational Therapy Session Note  Patient Details  Name: Alison Lopez MRN: [id number] Date of Birth: 1956-09-24  Session 1 Today's Date: 02/22/2024 OT Individual Time: 9562-1308 OT Individual Time Calculation (min): 37 min    Session 2 Today's Date: 02/22/2024 OT Individual Time: 1400-1500 OT Individual Time Calculation (min): 60 min     Short Term Goals: Week 1:  OT Short Term Goal 1 (Week 1): Pt will don shirt with mod A OT Short Term Goal 2 (Week 1): Pt will don pants with mod A OT Short Term Goal 3 (Week 1): Pt will complete stand pivot to the toilet with min A OT Short Term Goal 4 (Week 1): Pt will attend to right side of body in ADL task with mod cues  Skilled Therapeutic Interventions/Progress Updates:    Session 1 Pt received supine with no c/o pain, agreeable to OT session. She was oriented to year and place. She was unaware of urine soaked brief. She completed bed mobility to EOB with min A. She stood with posterior bias and ataxic trunk. She completed functional mobility into the bathroom with min HHA. She required mod cueing for sequencing turn to toilet and for steps of toilet. She voided BM continently. HOH for facilitating peri hygiene- mod A overall. She completed transfer to the sink following and required mod HOH to wash hands d/t motor planning deficits. She required a brief supine rest break following. She donned pants with max A. 50 ft of functional mobility with posterior bias and truncal ataxia, mod-min A required. She returned to her room and was left supine with all needs met, bed alarm set.    Session 2  Pt received supine with no c/o pain, agreeable to OT session. She completed functional mobility from bed to the toilet with min HHA. Mod cueing for sequencing turn to the toilet. She voided urine and had urine soaked brief. She transferred into the shower with min A following with max cueing for sequencing. She required mod HOH to wash using the RUE, as well  as multimodal cueing to wash with the LUE. In standing she completed peri hygiene with mod A overall but was able to remain standing with grab bar support with min A. She demonstrated severe motor planning deficits with trying to dress following shower. Max multimodal cueing,backward chaining and max A required for pt to don shirt and pants. She is also demonstrating significant R inattention. She was very fatigued following and took a supine rest break while OT got her a yogurt for a snack. Worked on self feeding, again with significant motor planning deficits, complicated by vision loss. She required mod HOH to bring spoon to mouth with very poor awareness- not scooping more yogurt and still attempting to continue to bring spoon to her mouth. She required min A intermittently for sitting balance. She took another brief supine break and ended session with bout of 200 ft functional mobility with min A to address functional activity tolerance and dynamic standing balance for carryover to ADL transfers. Pt left supine with all needs met, bed alarm set.    Therapy Documentation Precautions:  Precautions Precautions: Fall Recall of Precautions/Restrictions: Impaired Precaution/Restrictions Comments: legally blind Restrictions Weight Bearing Restrictions Per Provider Order: No   Therapy/Group: Individual Therapy  Crissie Reese 02/22/2024, 6:29 AM

## 2024-02-22 NOTE — Progress Notes (Signed)
 Physical Therapy Session Note  Patient Details  Name: Alison Lopez MRN: [id number] Date of Birth: 11/04/1956  Today's Date: 02/22/2024 PT Individual Time: 1121-1201 PT Individual Time Calculation (min): 40 min   Short Term Goals: Week 1:  PT Short Term Goal 1 (Week 1): STGs = LTGs  Skilled Therapeutic Interventions/Progress Updates:    Pt presents in room in bed, agreeable to PT. Pt denies pain at this time. Session focused on therapeutic activities for participation with self care tasks, transfer training and bed mobility as well as gait training for tolerance to upright and dynamic standing balance without assistive device.  Pt completes bed mobility with min assist, requires CGA/min assist for seated balance with pt demonstrating R lateral lean and LOB. Pt dons long sleeve shirt seated EOB with max assist, pt unable to sequence donning shirt and with insufficient sitting balance to maintain upright during task. Therapist then dons bilateral shoes total assist for time management, pt able to maintain upright posture with CGA.  Pt completes stand step transfer without device with min assist for reaching for WC to position for sitting secondary to vision deficits. Pt transported to day room dependently for time management and energy conservation. Pt completes sit<>stand with CGA, ambulates with CGA/min assist 68', 100', and 175'. Pt demonstrating inconsistent step length, stance time and cadence, decrease gait speed but increases with each trial. Pt with decreased stance time LLE, demonstrates L cervical/trunk rotation during gait, requires max cues for obstacle negotiation secondary to visual impairments but does demonstrate L path deviation. Pt provided with seated rest breaks between all gait trials to promote energy conservation and quality with tasks.  Pt transported back to room dependently for time management, completes ambulatory transfer back to bed with min HHA. Pt completes sit to  supine with min assist for BLE mgmt. Pt remains supine with all needs within reach, call light in place, and bed alarm activated at end of session.  Therapy Documentation Precautions:  Precautions Precautions: Fall Recall of Precautions/Restrictions: Impaired Precaution/Restrictions Comments: legally blind Restrictions Weight Bearing Restrictions Per Provider Order: No    Therapy/Group: Individual Therapy  Annia Kilts PT, DPT 02/22/2024, 12:32 PM

## 2024-02-22 NOTE — Progress Notes (Signed)
 Patient ID: Alison Lopez, female   DOB: [birth year], 68 y.o.   MRN: [id number]   0940- SW spoke with pt dtr Amber to follow-up and inform will update after team conference. She reports that she has CAP/DA forms. She will bring forms in today, and will notify SW so forms can be given to physician to complete.    Norval Been, MSW, LCSW Office: [phone] Cell: [phone] Fax: [phone]

## 2024-02-22 NOTE — Progress Notes (Signed)
 Inpatient Rehabilitation  Patient information reviewed and entered into eRehab system by [facility]. Karen Kays., CCC/SLP, PPS Coordinator.  Information including medical coding, functional ability and quality indicators will be reviewed and updated through discharge.

## 2024-02-22 NOTE — Progress Notes (Signed)
 PROGRESS NOTE   Subjective/Complaints: No acute complaints.  No events overnight.  Diastolic blood pressure remains a little soft, but overall vitals are stable.  Excellent fluid intakes per I's and O's.  A.m. labs significant for elevated creatinine 1.08, albumin 2.4, and platelets now improved to 102. Incontinent of bowel and bladder, large black bowel movement this a.m.  ROS: +impaired cognition, denies pain   Objective:   No results found. Recent Labs    02/22/24 0524  WBC 6.0  HGB 9.7*  HCT 29.0*  PLT 102*   Recent Labs    02/22/24 0524  NA 140  K 4.8  CL 107  CO2 26  GLUCOSE 100*  BUN 18  CREATININE 1.08*  CALCIUM 9.0     Intake/Output Summary (Last 24 hours) at 02/22/2024 0830 Last data filed at 02/22/2024 0815 Gross per 24 hour  Intake 1360 ml  Output --  Net 1360 ml        Physical Exam: Vital Signs Blood pressure 120/63, pulse 64, temperature (!) 97.5 F (36.4 C), temperature source Oral, resp. rate 18, height 4\' 9"  (1.448 m), weight 49.7 kg, SpO2 98%. Gen: no distress, normal appearing.  Laying in bed. HEENT: oral mucosa pink and moist, NCAT Cardio: Reg rate and rhythm, systolic murmur. Chest: normal effort, normal rate of breathing, clear to auscultation bilaterally. Abd: soft, non-distended.  Positive bowel sounds. Ext: no edema Psych: pleasant, normal affect Skin: intact;   Neurologic: Awake, alert, oriented to self, place and time with some cues.  Follows 3 out of 3 simple commands.  Obvious processing delays and memory deficits.  Cranial nerves II through XII grossly intact.  No sensory deficits.  Motor 4 out of 5 throughout bilateral upper and lower extremities.  Reflexes intact.  No Hoffmann's, Babinski.  No obvious tone.   Unchanged 4-14     Assessment/Plan: 1. Functional deficits which require 3+ hours per day of interdisciplinary therapy in a comprehensive inpatient rehab  setting. Physiatrist is providing close team supervision and 24 hour management of active medical problems listed below. Physiatrist and rehab team continue to assess barriers to discharge/monitor patient progress toward functional and medical goals  Care Tool:  Bathing    Body parts bathed by patient: Right arm, Chest, Abdomen, Left upper leg, Right upper leg   Body parts bathed by helper: Right lower leg, Left lower leg, Buttocks, Front perineal area, Left arm, Face     Bathing assist Assist Level: Maximal Assistance - Patient 24 - 49%     Upper Body Dressing/Undressing Upper body dressing   What is the patient wearing?: Dress    Upper body assist Assist Level: Total Assistance - Patient < 25%    Lower Body Dressing/Undressing Lower body dressing      What is the patient wearing?: Incontinence brief     Lower body assist Assist for lower body dressing: Total Assistance - Patient < 25%     Toileting Toileting    Toileting assist Assist for toileting: Maximal Assistance - Patient 25 - 49%     Transfers Chair/bed transfer  Transfers assist     Chair/bed transfer assist level: Minimal Assistance - Patient >  75%     Locomotion Ambulation   Ambulation assist      Assist level: Minimal Assistance - Patient > 75% Assistive device: No Device Max distance: 100   Walk 10 feet activity   Assist     Assist level: Minimal Assistance - Patient > 75% Assistive device: No Device   Walk 50 feet activity   Assist    Assist level: Minimal Assistance - Patient > 75% Assistive device: No Device    Walk 150 feet activity   Assist Walk 150 feet activity did not occur: Safety/medical concerns         Walk 10 feet on uneven surface  activity   Assist     Assist level: Moderate Assistance - Patient - 50 - 74% Assistive device: Hand held assist   Wheelchair     Assist   Type of Wheelchair: Manual    Wheelchair assist level: Dependent -  Patient 0% Max wheelchair distance: 150    Wheelchair 50 feet with 2 turns activity    Assist        Assist Level: Dependent - Patient 0%   Wheelchair 150 feet activity     Assist      Assist Level: Dependent - Patient 0%   Blood pressure 120/63, pulse 64, temperature (!) 97.5 F (36.4 C), temperature source Oral, resp. rate 18, height 4\' 9"  (1.448 m), weight 49.7 kg, SpO2 98%.  Medical Problem List and Plan: 1. 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  Status post bur hole 01/30/2024             -patient may shower             -ELOS/Goals: 10 to 14 days, supervision PT/OT, min assist SLP             -Stable to contnue IRF  2.  Antithrombotics: -DVT/anticoagulation:  Mechanical: Antiembolism stockings, thigh (TED hose) Bilateral lower extremities             -antiplatelet therapy: N/A 3. Pain Management: Tylenol as needed 4. Mood/Behavior/Sleep: continue Aricept 10 mg nightly, Zoloft 25 mg daily, melatonin 5 mg nightly              -antipsychotic agents: N/A 5. Neuropsych/cognition: This patient is not capable of making decisions on her own behalf.  6. Skin/Wound Care: Routine skin checks 7. Fluids/Electrolytes/Nutrition: Routine in and outs with follow-up chemistries   - 4/14: Creatinine mildly elevated, BUN stable, p.o. fluid intakes are excellent. Repeat in 2 days to trend.   8.  Antiphospholipid syndrome/history of DVT/thrombocytopenia secondary to antiphospholipid syndrome.  Followed by hematology services.  Chronic ELIQUIS REMAINS ON HOLD FOR AT LEAST TWO WEEKS GIVEN SDH WITH FOLLOW UP CT HEAD.   - 4/14: Thrombocytopenia improving.  9.  Hypothyroidism. continue Synthroid  10.  Hyperlipidemia.  continue Crestor  11.  Diabetes mellitus retinopathy/legally blind.  Latest hemoglobin A1c 4.8.  SSI  12. Bradycardia: continue to monitor HR TID  13.  Anemia.  Iron supplement added 4/12   -4/14: HgB  stable 9-10 LOS: 3 days A FACE TO FACE EVALUATION WAS PERFORMED  Bea Lime 02/22/2024, 8:30 AM

## 2024-02-22 NOTE — Plan of Care (Signed)
  Problem: Consults Goal: RH BRAIN INJURY PATIENT EDUCATION Description: Description: See Patient Education module for eduction specifics Outcome: Progressing   Problem: RH BOWEL ELIMINATION Goal: RH STG MANAGE BOWEL WITH ASSISTANCE Description: STG Manage Bowel with minimal  Assistance. Outcome: Progressing   Problem: RH BLADDER ELIMINATION Goal: RH STG MANAGE BLADDER WITH ASSISTANCE Description: STG Manage Bladder With minimal  Assistance Outcome: Progressing   Problem: RH SKIN INTEGRITY Goal: RH STG SKIN FREE OF INFECTION/BREAKDOWN Description: Manage skin free of infection/breakdown with minimal assistance from daughter Outcome: Progressing   Problem: RH SAFETY Goal: RH STG ADHERE TO SAFETY PRECAUTIONS W/ASSISTANCE/DEVICE Description: STG Adhere to Safety Precautions With minimal Assistance/Device. Outcome: Progressing   Problem: RH PAIN MANAGEMENT Goal: RH STG PAIN MANAGED AT OR BELOW PT'S PAIN GOAL Outcome: Progressing   Problem: Consults Goal: RH STROKE PATIENT EDUCATION Description: See Patient Education module for education specifics  Outcome: Progressing   Problem: RH COGNITION-NURSING Goal: RH STG USES MEMORY AIDS/STRATEGIES W/ASSIST TO PROBLEM SOLVE Description: STG Uses Memory Aids/Strategies With minimal Assistance to Problem Solve. Outcome: Not Progressing   Problem: RH KNOWLEDGE DEFICIT BRAIN INJURY Goal: RH STG INCREASE KNOWLEDGE OF SELF CARE AFTER BRAIN INJURY Description: Manage increase  knowledge of self care after brain injury with minimal assistance  from daughter using educational materials provided Outcome: Not Progressing

## 2024-02-22 NOTE — Progress Notes (Signed)
 Went to patient's room to review current situation, team conference and plan of care. Patient was asleep, per nurse she's confused and no family member in room. Will revisit. Continue to follow along to provide educational needs to facilitate preparation for discharge.

## 2024-02-22 NOTE — Care Management (Signed)
 Inpatient Rehabilitation Center Individual Statement of Services  Patient Name:  Alison Lopez  Date:  02/22/2024  Welcome to the Inpatient Rehabilitation Center.  Our goal is to provide you with an individualized program based on your diagnosis and situation, designed to meet your specific needs.  With this comprehensive rehabilitation program, you will be expected to participate in at least 3 hours of rehabilitation therapies Monday-Friday, with modified therapy programming on the weekends.  Your rehabilitation program will include the following services:  Physical Therapy (PT), Occupational Therapy (OT), Speech Therapy (ST), 24 hour per day rehabilitation nursing, Therapeutic Recreaction (TR), Psychology, Neuropsychology, Care Coordinator, Rehabilitation Medicine, Nutrition Services, Pharmacy Services, and Other  Weekly team conferences will be held on Tuesdays to discuss your progress.  Your Inpatient Rehabilitation Care Coordinator will talk with you frequently to get your input and to update you on team discussions.  Team conferences with you and your family in attendance may also be held.  Expected length of stay: 7-14 days   Overall anticipated outcome: Supervision  Depending on your progress and recovery, your program may change. Your Inpatient Rehabilitation Care Coordinator will coordinate services and will keep you informed of any changes. Your Inpatient Rehabilitation Care Coordinator's name and contact numbers are listed  below.  The following services may also be recommended but are not provided by the Inpatient Rehabilitation Center:  Driving Evaluations Home Health Rehabiltiation Services Outpatient Rehabilitation Services Vocational Rehabilitation   Arrangements will be made to provide these services after discharge if needed.  Arrangements include referral to agencies that provide these services.  Your insurance has been verified to be:  [employer]  Your primary  doctor is:  Sionne George  Pertinent information will be shared with your doctor and your insurance company.  Inpatient Rehabilitation Care Coordinator:  Kathey Pang [phone] or ([phone]  Information discussed with and copy given to patient by: Rennis Case, 02/22/2024, 9:51 AM

## 2024-02-22 NOTE — Progress Notes (Signed)
 Speech Language Pathology Daily Session Note  Patient Details  Name: Alison Lopez MRN: [id number] Date of Birth: [birth year]  Today's Date: 02/22/2024 SLP Individual Time: 1003-1100 SLP Individual Time Calculation (min): 57 min  Short Term Goals: Week 1: SLP Short Term Goal 1 (Week 1): Pt will complete functional naming tasks w/ modA SLP Short Term Goal 2 (Week 1): Pt will utilize compensatory speech strategies at sentence level w/ maxA SLP Short Term Goal 3 (Week 1): Pt will follow one step commands during functional cognitive tasks w/ modA SLP Short Term Goal 4 (Week 1): pt will solve problems during functional cognitive tasks w/ maxA  Skilled Therapeutic Interventions:  Patient was seen in am to address cognitive re- training, speech and language. Pt was easily alerted upon SLP arrival and agreeable for session. Pt indep oriented to situation, setting, year and day of week but not month. Pt consistently required cues to orient to month across session. SLP introduced speech intelligibility strategies of over articulation and loud voice. Pt requiring intermittent cues across session to increase vocal intensity with ability to return demo when prompted. SLP addressing naming through responsive naming task. Pt verbalizing functional items (cup, bed, pillow, blanket, spoon, fork, knife) with 100% acc and non priority items/ info with 77% acc. Pt subsequently challenged in communication of immediate environment, and personal and biographical information with pt responding appropriately in 100% of opportunities. In other minutes of session, SLP addressed problem solving through training pt in use of call button, examples of utilization, and identification. Pt consistently aware of recommendations not to get up by herself. She required consistent training on identifying call button with only min A needed to identify examples of utilization. At conclusion of session, pt was left in bed with call button  within reach nad bed alarm active. SLP to continue POC.   Pain Pain Assessment Pain Scale: 0-10 Pain Score: 0-No pain  Therapy/Group: Individual Therapy  Adela Holter 02/22/2024, 12:50 PM

## 2024-02-23 LAB — GLUCOSE, CAPILLARY
Glucose-Capillary: 99 mg/dL (ref 70–99)
Glucose-Capillary: 101 mg/dL — ABNORMAL HIGH (ref 70–99)
Glucose-Capillary: 134 mg/dL — ABNORMAL HIGH (ref 70–99)
Glucose-Capillary: 144 mg/dL — ABNORMAL HIGH (ref 70–99)

## 2024-02-23 MED ORDER — METHYLPHENIDATE HCL 5 MG PO TABS
5.0000 mg | ORAL_TABLET | Freq: Two times a day (BID) | ORAL | Status: DC
  Administered 2024-02-23 – 2024-03-01 (×14): 5 mg via ORAL
  Filled 2024-02-23 (×14): qty 1

## 2024-02-23 NOTE — Progress Notes (Signed)
 Patient ID: Alison Lopez, female   DOB: 1956/03/04, 68 y.o.   MRN: [id number]  1632-SW spoke with pt dtr Amber to provide updates from team conference, and d/c date 4/25.  SW found CAP/DA forms in room.   Norval Been, MSW, LCSW Office: [phone] Cell: [phone] Fax: [phone]

## 2024-02-23 NOTE — Progress Notes (Signed)
 PROGRESS NOTE   Subjective/Complaints: No acute complaints.  No events overnight.   Incontinent of bowel and bladder  Per therapies, she is significantly worse in all ADLS since returning to IRF. Motor planning very poor. Cannot feed herself.  On exam, patient is grossly alert, can move all 4 extremities, but does have increased right inattention and difficulty with processing/sequencing.  ROS: +impaired cognition, denies pain   Objective:   No results found. Recent Labs    02/22/24 0524  WBC 6.0  HGB 9.7*  HCT 29.0*  PLT 102*   Recent Labs    02/22/24 0524  NA 140  K 4.8  CL 107  CO2 26  GLUCOSE 100*  BUN 18  CREATININE 1.08*  CALCIUM 9.0     Intake/Output Summary (Last 24 hours) at 02/23/2024 1025 Last data filed at 02/23/2024 0810 Gross per 24 hour  Intake 460 ml  Output --  Net 460 ml        Physical Exam: Vital Signs Blood pressure 121/76, pulse 65, temperature 98.9 F (37.2 C), temperature source Oral, resp. rate 16, height 4\' 9"  (1.448 m), weight 49.7 kg, SpO2 95%. Gen: no distress, normal appearing.  Laying in bed. HEENT: oral mucosa pink and moist, NCAT.  Bilateral blindness, baseline. Cardio: Reg rate and rhythm, systolic murmur. Chest: normal effort, normal rate of breathing, clear to auscultation bilaterally. Abd: soft, non-distended.  Positive bowel sounds. Ext: no edema Psych: pleasant, normal affect Skin: intact; no apparent rashes or lesions.  Neurologic: Awake, alert, oriented to self, place and time with some cues.  Follows 3 out of 3 simple commands.  Obvious processing delays and memory deficits.  Cranial nerves II through XII grossly intact.  No sensory deficits.  Motor 4 out of 5 throughout bilateral upper and lower extremities.  Reflexes intact.  No Hoffmann's, Babinski.  No obvious tone.  + Difficulty with attention, processing delays + Significant right sided  inattention   Assessment/Plan: 1. Functional deficits which require 3+ hours per day of interdisciplinary therapy in a comprehensive inpatient rehab setting. Physiatrist is providing close team supervision and 24 hour management of active medical problems listed below. Physiatrist and rehab team continue to assess barriers to discharge/monitor patient progress toward functional and medical goals  Care Tool:  Bathing    Body parts bathed by patient: Right arm, Chest, Abdomen, Left upper leg, Right upper leg   Body parts bathed by helper: Right lower leg, Left lower leg, Buttocks, Front perineal area, Left arm, Face     Bathing assist Assist Level: Maximal Assistance - Patient 24 - 49%     Upper Body Dressing/Undressing Upper body dressing   What is the patient wearing?: Dress    Upper body assist Assist Level: Total Assistance - Patient < 25%    Lower Body Dressing/Undressing Lower body dressing      What is the patient wearing?: Incontinence brief     Lower body assist Assist for lower body dressing: Total Assistance - Patient < 25%     Toileting Toileting    Toileting assist Assist for toileting: Maximal Assistance - Patient 25 - 49%     Transfers Chair/bed transfer  Transfers assist     Chair/bed transfer assist level: Minimal Assistance - Patient > 75%     Locomotion Ambulation   Ambulation assist      Assist level: Minimal Assistance - Patient > 75% Assistive device: No Device Max distance: 100   Walk 10 feet activity   Assist     Assist level: Minimal Assistance - Patient > 75% Assistive device: No Device   Walk 50 feet activity   Assist    Assist level: Minimal Assistance - Patient > 75% Assistive device: No Device    Walk 150 feet activity   Assist Walk 150 feet activity did not occur: Safety/medical concerns         Walk 10 feet on uneven surface  activity   Assist     Assist level: Moderate Assistance - Patient  - 50 - 74% Assistive device: Hand held assist   Wheelchair     Assist Is the patient using a wheelchair?: Yes Type of Wheelchair: Manual    Wheelchair assist level: Dependent - Patient 0% Max wheelchair distance: 150    Wheelchair 50 feet with 2 turns activity    Assist        Assist Level: Dependent - Patient 0%   Wheelchair 150 feet activity     Assist      Assist Level: Dependent - Patient 0%   Blood pressure 121/76, pulse 65, temperature 98.9 F (37.2 C), temperature source Oral, resp. rate 16, height 4\' 9"  (1.448 m), weight 49.7 kg, SpO2 95%.  Medical Problem List and Plan: 1. 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  Status post bur hole 01/30/2024             - patient may shower             - ELOS/Goals: 10 to 14 days, CGA PT/OT, min assist SLP - 4/25             - Stable to contnue IRF  - 4/15: Worsening truncal ataxia and awareness compared to prior IRF admission. Max cueing for initiation of any ADL tasks. Global intattetnion R>L. Min A for mobility with very poor sitting balance . Min A transfers and amb 175 ft. Worse dysarthria and apraxia.   2.  Antithrombotics: -DVT/anticoagulation:  Mechanical: Antiembolism stockings, thigh (TED hose) Bilateral lower extremities             -antiplatelet therapy: N/A 3. Pain Management: Tylenol as needed 4. Mood/Behavior/Sleep: continue Aricept 10 mg nightly, Zoloft 25 mg daily, melatonin 5 mg nightly              -antipsychotic agents: N/A   - 4/15: Add sleep log, add delirium precautions  5. Neuropsych/cognition: This patient is not capable of making decisions on her own behalf.   - 4/15: Ritalin 5 mg BID for poor attention and lethargy  6. Skin/Wound Care: Routine skin checks 7. Fluids/Electrolytes/Nutrition: Routine in and outs with follow-up chemistries   - 4/14: Creatinine mildly elevated, BUN stable, p.o. fluid intakes are excellent. Repeat in  2 days to trend.   8.  Antiphospholipid syndrome/history of DVT/thrombocytopenia secondary to antiphospholipid syndrome.  Followed by hematology services.  Chronic ELIQUIS REMAINS ON HOLD FOR AT LEAST TWO WEEKS GIVEN SDH WITH FOLLOW UP CT HEAD (4/25).   - 4/14: Thrombocytopenia improving.  9.  Hypothyroidism. continue Synthroid  10.  Hyperlipidemia.  continue Crestor  11.  Diabetes mellitus retinopathy/legally blind.  Latest hemoglobin A1c  4.8.  SSI   - BG well controlled,  Recent Labs    02/22/24 1631 02/22/24 2034 02/23/24 0521  GLUCAP 118* 135* 99      12. Bradycardia: continue to monitor HR TID  - Heart rate and vital stable  13.  Anemia.  Iron supplement added 4/12   -4/14: HgB stable 9-10 LOS: 4 days A FACE TO FACE EVALUATION WAS PERFORMED  Bea Lime 02/23/2024, 10:25 AM

## 2024-02-23 NOTE — Progress Notes (Signed)
 Occupational Therapy Session Note  Patient Details  Name: Alison Lopez MRN: [id number] Date of Birth: September 08, 1956  Today's Date: 02/23/2024 OT Individual Time: 4401-0272 OT Individual Time Calculation (min): 74 min    Short Term Goals: Week 1:  OT Short Term Goal 1 (Week 1): Pt will don shirt with mod A OT Short Term Goal 2 (Week 1): Pt will don pants with mod A OT Short Term Goal 3 (Week 1): Pt will complete stand pivot to the toilet with min A OT Short Term Goal 4 (Week 1): Pt will attend to right side of body in ADL task with mod cues  Skilled Therapeutic Interventions/Progress Updates:    Pt received supine with no c/o pain, agreeable to OT session. Started with toileting, functional mobility into the bathroom with ataxic trunk, min A. She required mod cueing for sequencing toileting tasks. She had no urine void but was heavily incontinent of urine in brief. She completed peri hygiene in standing with mod verbal and tactile cueing for LUE initiation. She had poor sitting balance on the toilet, partly due to her feet not touching the floor, pt with frequent posterior lean/LOB. Then focused on self feeding seated in the w/c. Extra time taken to ensure proper positioning and trunk stability to provide max mobility to UE. Trialed built up utensils as well as HOH for both R and L UE. LUE was more successful than R but pt still demonstrating severe motor planning, proprioception, and stereognosis deficits that all impacted independence. She required max A overall. She had poor awareness of deficits as well. Pt was left sitting up in the w/c with all needs met, chair alarm set, and call bell within reach.    Therapy Documentation Precautions:  Precautions Precautions: Fall Recall of Precautions/Restrictions: Impaired Precaution/Restrictions Comments: legally blind Restrictions Weight Bearing Restrictions Per Provider Order: No  Therapy/Group: Individual Therapy  Alison Lopez 02/23/2024, 6:17 AM

## 2024-02-23 NOTE — Progress Notes (Signed)
 Speech Language Pathology Daily Session Note  Patient Details  Name: Alison Lopez MRN: [id number] Date of Birth: August 10, 1956  {chl ip rehab slp time calculations:[id number]}  Short Term Goals: Week 1: SLP Short Term Goal 1 (Week 1): Pt will complete functional naming tasks w/ modA SLP Short Term Goal 2 (Week 1): Pt will utilize compensatory speech strategies at sentence level w/ maxA SLP Short Term Goal 3 (Week 1): Pt will follow one step commands during functional cognitive tasks w/ modA SLP Short Term Goal 4 (Week 1): pt will solve problems during functional cognitive tasks w/ maxA  Skilled Therapeutic Interventions:  Patient was seen in am to address cognitive re-training and language. Pt was easily alerted upon SLP arrival and agreeable for session. When asked about tasks and activities completed this date pt unable to recall participation in OT session this am and stating she transferred her self to the [facility] and did not recall consuming breakfast meal. SLP reviewed specifics of OT session including  assistance with peri care and toileting along with assistance with self feeding and trialing built up utensils. In other minutes of session, pt successfully verbalized biographical information including name, DOB, and address indep and requiring min cues to orient to age. She was consistently oriented to setting and situation. SLP challenged pt's language through responsive naming task with pt naming objects with 83% acc and sup A. She was subsequently engaged in a conversational exchange where she talked about her family, previous occupation, and favorite food. Occasional word finding difficulty noted however unable to distinguish cognitive deficits vs language deficits. In other minutes of session, SLP addressed problem solving through re-introduction to call button as pt was unable to identify call button. She consistently idenitifed ~ 2 uses of call button along with recommendations not to stand up  without assist. Pt demo use of call button at conclusion of session when asking for assist back to bed. Pt left upright in WC with call button within reach and chair alarm active. SLP to continue POC.   Pain Pain Assessment Pain Scale: 0-10 Pain Score: 0-No pain  Therapy/Group: Individual Therapy  Adela Holter 02/23/2024, 11:20 AM

## 2024-02-23 NOTE — Patient Care Conference (Signed)
 Inpatient RehabilitationTeam Conference and Plan of Care Update Date: 02/23/2024   Time: 1026 am    Patient Name: Alison Lopez      Medical Record Number: [id number]  Date of Birth: [birth year] Sex: Female         Room/Bed: 4W20C/4W20C-01 Payor Info: Payor: [employer] MEDICARE / Plan: [employer] MEDICARE HMO / Product Type: *No Product type* /    Admit Date/Time:  02/19/2024  3:35 PM  Primary Diagnosis:  SDH (subdural hematoma) [facility])  Hospital Problems: Principal Problem:   SDH (subdural hematoma) [facility])    Expected Discharge Date: Expected Discharge Date: 03/04/24  Team Members Present: Physician leading conference: Dr. Cherri Corns Social Worker Present: Norval Been, LCSWA Nurse Present: Jerene Monks, RN PT Present: Crist Dominion, PT OT Present: Eilene Grater, OT SLP Present: Other (comment) Tally Faes, SLP) PPS Coordinator present : Jestine Moron, SLP     Current Status/Progress Goal Weekly Team Focus  Bowel/Bladder   incontinent of bowel/bladder at baseline   maintain present status/skin integrity   assist with toilet training    Swallow/Nutrition/ Hydration   dysphagia 3/ thin   mod A       ADL's   Mod-max A ADLs 2/2 apraxia. Min A to moments of mod A d/t worse posterior bias and truncal ataxia. R hemi with inattention. Pt will be a high burden of care going home, will need hands on assist d/t cognitive deficits   CGA to min A   ADls, transfers, cognitive retraining, motor planning    Mobility   bed mobility min assist, transfers min assist, gait without device 175' min assist   supervision/CGA  barriers: low vision, cognitive deficits, fatigue; continue working on gait training, transfer training, NMR, endurance    Communication   motor speech deficits, mild language deficits,   modA   naming task, implement compensatory strategies    Safety/Cognition/ Behavioral Observations  baseline cognitive deficits, mild problem solving deficits   modA    pt/ family edu, problem solving.    Pain   No pain   remain pain free   assess qshift/prn    Skin   skin intact   maintain skin integrity  assess qshift/prn      Discharge Planning:  Pt will d/c to home with support from family; pt dtr Amber working on arranging 24/7 care. SW will confirm there are no barriers to discharge.    Team Discussion: Patient was admitted post spontaneous bilateral left greater than right subdural hematoma.Patient limited by worsening ataxia and awareness, global inattention, poor sitting balance,poor endurance, fatigue, apraxia, dysarthria and cognitive deficits.  Patient on target to meet rehab goals: no, patient requires mod-max assist with ADL's. Patient requires minimal assistance with transfers and able to ambulate  up to 175' with minimal assistance. Overall goals are set for minimal assistance- supervision at discharge.   *See Care Plan and progress notes for long and short-term goals.   Revisions to Treatment Plan:  Delirium precautions Sleep log  Teaching Needs: Safety, medications, transfers, toileting , etc.    Current Barriers to Discharge: Decreased caregiver support, Incontinence, and Behavior  Possible Resolutions to Barriers:  Family Education     Medical Summary Current Status: medically complicated by anemia, vision deficits, cognitive deficits/questionable dementia, bradycardia, and bowel and bladder incontinence  Barriers to Discharge: Behavior/Mood;Electrolyte abnormality;Inadequate Nutritional Intake;Incontinence;Renal Insufficiency/Failure;Self-care education;Medical stability   Possible Resolutions to [employer] Focus: Timed toileting and and titrate bowel medications, monitor labs for thrombocytopenia, encourage p.o. intake, encourage sleep/wake cycle  and medication adjustments to promote daytime alertness   Continued Need for Acute Rehabilitation Level of Care: The patient requires daily medical management by a  physician with specialized training in physical medicine and rehabilitation for the following reasons: Direction of a multidisciplinary physical rehabilitation program to maximize functional independence : Yes Medical management of patient stability for increased activity during participation in an intensive rehabilitation regime.: Yes Analysis of laboratory values and/or radiology reports with any subsequent need for medication adjustment and/or medical intervention. : Yes   I attest that I was present, lead the team conference, and concur with the assessment and plan of the team.   Jerene Monks 02/22/2025, 1026 AM

## 2024-02-23 NOTE — IPOC Note (Signed)
 Overall Plan of Care [facility]) Patient Details Name: Alison Lopez MRN: [id number] DOB: [birth year]  Admitting Diagnosis: SDH (subdural hematoma) [facility])  Hospital Problems: Principal Problem:   SDH (subdural hematoma) (HCC)     Functional Problem List: Nursing Bladder, Bowel, Endurance, Medication Management, Nutrition, Pain, Perception, Safety, Edema  PT Balance, Safety, Motor, Endurance, Perception  OT Balance, Sensory, Cognition, Endurance, Motor, Perception, Safety, Vision  SLP Cognition, Sensory, Linguistic, Motor, Nutrition, Endurance, Safety  TR         Basic ADL's: OT Eating, Grooming, Bathing, Dressing, Toileting     Advanced  ADL's: OT       Transfers: PT Bed Mobility, Bed to Chair, Customer service manager, Tub/Shower     Locomotion: PT Ambulation, Stairs     Additional Impairments: OT Fuctional Use of Upper Extremity  SLP Communication, Social Cognition expression, comprehension Problem Solving, Attention, Memory  TR      Anticipated Outcomes Item Anticipated Outcome  Self Feeding S  Swallowing      Basic self-care  min guard  Toileting  min guard   Bathroom Transfers min guard  Bowel/Bladder  manage bowels with mod I and bladder with toileting  Transfers  S transfers,  Locomotion  contact gaurd for gait and stairs  Communication  modA  Cognition  modA  Pain  Pain<4 w/ prns  Safety/Judgment  manage safety with minimal assistance   Therapy Plan: PT Intensity: Minimum of 1-2 x/day ,45 to 90 minutes PT Frequency: 5 out of 7 days PT Duration Estimated Length of Stay: 7 to 10 days OT Intensity: Minimum of 1-2 x/day, 45 to 90 minutes OT Frequency: 5 out of 7 days OT Duration/Estimated Length of Stay: 12-14 days SLP Intensity: Minumum of 1-2 x/day, 30 to 90 minutes SLP Frequency: 3 to 5 out of 7 days SLP Duration/Estimated Length of Stay: 10-14 days   Team Interventions: Nursing Interventions Bladder Management, Bowel Management, Disease  Management/Prevention, Medication Management, Patient/Family Education, Pain Management  PT interventions Ambulation/gait training, Balance/vestibular training, Cognitive remediation/compensation, Discharge planning, DME/adaptive equipment instruction, Neuromuscular re-education, Functional mobility training, Therapeutic Activities, UE/LE Strength taining/ROM, UE/LE Coordination activities, Therapeutic Exercise, Stair training, Patient/family education  OT Interventions Balance/vestibular training, Patient/family education, Therapeutic Activities, Psychosocial support, Therapeutic Exercise, DME/adaptive equipment instruction, Wheelchair propulsion/positioning, Cognitive remediation/compensation, Community reintegration, Functional mobility training, Self Care/advanced ADL retraining, UE/LE Strength taining/ROM, Discharge planning, UE/LE Coordination activities, Disease mangement/prevention, Neuromuscular re-education, Visual/perceptual remediation/compensation  SLP Interventions Cognitive remediation/compensation, Internal/external aids, Speech/Language facilitation, Therapeutic Exercise, Cueing hierarchy, Functional tasks, Patient/family education, Therapeutic Activities  TR Interventions    SW/CM Interventions Discharge Planning, Psychosocial Support, Patient/Family Education   Barriers to Discharge MD  Medical stability, Home enviroment access/loayout, Incontinence, Lack of/limited family support, Insurance for SNF coverage, Medication compliance, Behavior, and memory/cognition  Nursing Decreased caregiver support Discharge: House  Discharge Home Layout: One level  Discharge Home Access: Stairs to enter  Entrance Stairs-Rails: Right  Entrance Stairs-Number of Steps: 4  PT Home environment access/layout blind  OT Decreased caregiver support    SLP Other (comments) baseline cognition/ability to carryover, visual deficits  SW Decreased caregiver support, Lack of/limited family support, Community education officer for  SNF coverage     Team Discharge Planning: Destination: PT-Home ,OT- Home , SLP-Home Projected Follow-up: PT-Home health PT, OT-  Home health OT, SLP-Outpatient SLP, 24 hour supervision/assistance Projected Equipment Needs: PT-To be determined, OT- To be determined, SLP-To be determined Equipment Details: PT- , OT-  Patient/family involved in discharge planning: PT- Patient,  OT-Patient, SLP-Patient unable/family or  caregive not available  MD ELOS: 10-14 days Medical Rehab Prognosis:  Good Assessment: The patient has been admitted for CIR therapies with the diagnosis of SDH. The team will be addressing functional mobility, strength, stamina, balance, safety, adaptive techniques and equipment, self-care, bowel and bladder mgt, patient and caregiver education, . Goals have been set at Supervision PT.OT, Min A SLP. Anticipated discharge destination is home.       See Team Conference Notes for weekly updates to the plan of care

## 2024-02-23 NOTE — Plan of Care (Signed)
 Patient is A&O x 2, confused. Vss, on room air. Denies pain. Safety maintained. Bed alarm on. Call bell in reach. Will continue to monitor.   Problem: Consults Goal: RH BRAIN INJURY PATIENT EDUCATION Description: Description: See Patient Education module for eduction specifics Outcome: Progressing   Problem: RH BOWEL ELIMINATION Goal: RH STG MANAGE BOWEL WITH ASSISTANCE Description: STG Manage Bowel with minimal  Assistance. Outcome: Progressing   Problem: RH BLADDER ELIMINATION Goal: RH STG MANAGE BLADDER WITH ASSISTANCE Description: STG Manage Bladder With minimal  Assistance Outcome: Progressing   Problem: RH SKIN INTEGRITY Goal: RH STG SKIN FREE OF INFECTION/BREAKDOWN Description: Manage skin free of infection/breakdown with minimal assistance from daughter Outcome: Progressing   Problem: RH SAFETY Goal: RH STG ADHERE TO SAFETY PRECAUTIONS W/ASSISTANCE/DEVICE Description: STG Adhere to Safety Precautions With minimal Assistance/Device. Outcome: Progressing   Problem: RH COGNITION-NURSING Goal: RH STG USES MEMORY AIDS/STRATEGIES W/ASSIST TO PROBLEM SOLVE Description: STG Uses Memory Aids/Strategies With minimal Assistance to Problem Solve. Outcome: Progressing   Problem: RH PAIN MANAGEMENT Goal: RH STG PAIN MANAGED AT OR BELOW PT'S PAIN GOAL Outcome: Progressing   Problem: RH KNOWLEDGE DEFICIT BRAIN INJURY Goal: RH STG INCREASE KNOWLEDGE OF SELF CARE AFTER BRAIN INJURY Description: Manage increase  knowledge of self care after brain injury with minimal assistance  from daughter using educational materials provided Outcome: Progressing   Problem: Consults Goal: RH STROKE PATIENT EDUCATION Description: See Patient Education module for education specifics  Outcome: Progressing

## 2024-02-24 LAB — BASIC METABOLIC PANEL WITH GFR
Anion gap: 3 — ABNORMAL LOW (ref 5–15)
BUN: 17 mg/dL (ref 8–23)
CO2: 24 mmol/L (ref 22–32)
Calcium: 8.5 mg/dL — ABNORMAL LOW (ref 8.9–10.3)
Chloride: 111 mmol/L (ref 98–111)
Creatinine, Ser: 0.94 mg/dL (ref 0.44–1.00)
GFR, Estimated: >60 mL/min (ref >60)
Glucose, Bld: 93 mg/dL (ref 70–99)
Potassium: 4.1 mmol/L (ref 3.5–5.1)
Sodium: 138 mmol/L (ref 135–145)

## 2024-02-24 LAB — GLUCOSE, CAPILLARY
Glucose-Capillary: 91 mg/dL (ref 70–99)
Glucose-Capillary: 92 mg/dL (ref 70–99)
Glucose-Capillary: 75 mg/dL (ref 70–99)
Glucose-Capillary: 100 mg/dL — ABNORMAL HIGH (ref 70–99)

## 2024-02-24 MED ORDER — ENSURE ENLIVE PO LIQD
237.0000 mL | Freq: Two times a day (BID) | ORAL | Status: DC
  Administered 2024-02-24 – 2024-03-04 (×11): 237 mL via ORAL

## 2024-02-24 MED ORDER — OXYBUTYNIN CHLORIDE 5 MG PO TABS
2.5000 mg | ORAL_TABLET | Freq: Every day | ORAL | Status: DC
  Administered 2024-02-24 – 2024-02-25 (×2): 2.5 mg via ORAL
  Filled 2024-02-24 (×2): qty 1

## 2024-02-24 NOTE — Progress Notes (Signed)
 Occupational Therapy Session Note  Patient Details  Name: Alison Lopez MRN: [id number] Date of Birth: 1956/06/17  Today's Date: 02/24/2024 OT Individual Time: 4098-1191 OT Individual Time Calculation (min): 74 min    Short Term Goals: Week 1:  OT Short Term Goal 1 (Week 1): Pt will don shirt with mod A OT Short Term Goal 2 (Week 1): Pt will don pants with mod A OT Short Term Goal 3 (Week 1): Pt will complete stand pivot to the toilet with min A OT Short Term Goal 4 (Week 1): Pt will attend to right side of body in ADL task with mod cues  Skilled Therapeutic Interventions/Progress Updates:    Pt received supine with no c/o pain, agreeable to OT session.  She came to EOB with min A and mod cueing for trunk elevation and positioning. She completed functional mobility into the bathroom with min-mod A for truncal ataxia. Mod cueing for sequencing transfer to toilet. She had no void. Mod HOH required to coordinate LUE use for peri hygiene but still max-total A overall. She had very poor sitting balance on the toilet with frequent large balance perturbations. Pt will need to use a narrow BSC for trunk support. Transfer back to the w/c following. Poor sustained attention to task throughout session. She completed oral care with mod A. She washed UB with mod A for holding washcloth in BUE, poor sustained attention to task and max cueing for sequencing. She required MAX A to don shirt d/t ataxia and apraxia. Same assist with LB. She completed 100 ft of functional mobility with mod A for truncal control. She returned to her room. W/c was switched from manual to TIS for improved safety when sitting. Pt was left sitting up in the wheelchair with all needs met, chair alarm set, and call bell within reach. Daughter present.   Therapy Documentation Precautions:  Precautions Precautions: Fall Recall of Precautions/Restrictions: Impaired Precaution/Restrictions Comments: legally blind Restrictions Weight  Bearing Restrictions Per Provider Order: No  Therapy/Group: Individual Therapy  [name] 02/24/2024, 6:17 AM

## 2024-02-24 NOTE — Progress Notes (Signed)
 Speech Language Pathology Daily Session Note  Patient Details  Name: Alison Lopez MRN: [id number] Date of Birth: [birth year]  Today's Date: 02/24/2024 SLP Individual Time: 1430-1530 SLP Individual Time Calculation (min): 60 min  Short Term Goals: Week 1: SLP Short Term Goal 1 (Week 1): Pt will complete functional naming tasks w/ modA SLP Short Term Goal 2 (Week 1): Pt will utilize compensatory speech strategies at sentence level w/ maxA SLP Short Term Goal 3 (Week 1): Pt will follow one step commands during functional cognitive tasks w/ modA SLP Short Term Goal 4 (Week 1): pt will solve problems during functional cognitive tasks w/ maxA  Skilled Therapeutic Interventions:   Pt greeted at bedside. She was awake/alert upon SLP arrival stating, "I'm ready for my questions." She benefited from modA cues for word finding during generative naming task (5 items). Additionally, she demonstrated improved initiation as compared to eval as she spontaneously ID her own categories to name. She also benefited from modA to describe common expressions in sentences. She required minA cues to utilize increased volume and over articulation to maintain 90% intelligibility at sentence level. At the end of tx tasks, she was assisted w/ positioning and left in the bed with the alarm on and call light within reach. Recommend cont ST.   Pain  No pain  Therapy/Group: Individual Therapy  [name] 02/24/2024, 2:59 PM

## 2024-02-24 NOTE — Progress Notes (Signed)
 Physical Therapy Session Note  Patient Details  Name: Alison Lopez MRN: [id number] Date of Birth: 05/29/1956  Today's Date: 02/24/2024 PT Individual Time: 0848-1000 PT Individual Time Calculation (min): 72 min   Short Term Goals: Week 1:  PT Short Term Goal 1 (Week 1): STGs = LTGs  Skilled Therapeutic Interventions/Progress Updates:  Patient supine in bed on entrance to room. Patient alert and agreeable to PT session. LPN present and providing morning medications. LPN asked pt if she had a BM. Pt states "no". On checking, pt has had an incontinent BM in brief.   Patient with no pain complaint at start of session.  Therapeutic Activity: Bed Mobility: Assisted LPN with pericare and changing pt with focus on bed mobility. Pt able to follow instructions for roll to R and L. Does require assist for LLE d/t ataxic movements and severe overshooting of placement and effort. Pt performed supine <> sit with ***. VC/ tc required for ***. Transfers: Pt performed sit<>stand and stand pivot transfers throughout session with ***. Provided vc/ tc for***.  Gait Training:  Pt ambulated *** ft using *** with ***. Demonstrated ***. Provided vc/ tc for ***.  Wheelchair Mobility:  Pt propelled wheelchair *** feet with ***. Provided vc/ tc for ***.  Neuromuscular Re-ed: NMR facilitated during session with focus on ***. Pt guided in ***. NMR performed for improvements in motor control and coordination, balance, sequencing, judgement, and self confidence/ efficacy in performing all aspects of mobility at highest level of independence.   Therapeutic Exercise: Pt performed the following exercises with vc/ tc for proper technique. ***  Patient *** at end of session with brakes locked, *** alarm set, and all needs within reach.   Therapy Documentation Precautions:  Precautions Precautions: Fall Recall of Precautions/Restrictions: Impaired Precaution/Restrictions Comments: legally  blind Restrictions Weight Bearing Restrictions Per Provider Order: No  Pain: No pain indicated or related this session.   Therapy/Group: Individual Therapy  Donne Gage PT, DPT, CSRS 02/24/2024, 10:05 AM

## 2024-02-24 NOTE — Progress Notes (Signed)
 PROGRESS NOTE   Subjective/Complaints: No acute complaints.  No events overnight.  Therapy notes, does seem to have done a little bit better with Ritalin yesterday afternoon, but continues with difficulty with concentration and balance.  Incontinent of bladder multiple times overnight. Eating approximately 50% of meals.  Minimal fluid intakes over the last 3 days. Labs this a.m. with improved creatinine to normal, otherwise stable.  ROS: +impaired cognition, denies pain   Objective:   No results found. Recent Labs    02/22/24 0524  WBC 6.0  HGB 9.7*  HCT 29.0*  PLT 102*   Recent Labs    02/22/24 0524 02/24/24 0531  NA 140 138  K 4.8 4.1  CL 107 111  CO2 26 24  GLUCOSE 100* 93  BUN 18 17  CREATININE 1.08* 0.94  CALCIUM 9.0 8.5*     Intake/Output Summary (Last 24 hours) at 02/24/2024 0827 Last data filed at 02/24/2024 0800 Gross per 24 hour  Intake 397 ml  Output --  Net 397 ml        Physical Exam: Vital Signs Blood pressure (!) 135/57, pulse 65, temperature 99.2 F (37.3 C), temperature source Oral, resp. rate 16, height 4\' 9"  (1.448 m), weight 49.7 kg, SpO2 96%. Gen: no distress, normal appearing.  Laying in bed. HEENT: oral mucosa pink and moist, NCAT.  Bilateral blindness, baseline. Cardio: Reg rate and rhythm, systolic murmur. Chest: normal effort, normal rate of breathing, clear to auscultation bilaterally. Abd: soft, non-distended.  Positive bowel sounds. Ext: no edema Psych: pleasant, normal affect Skin: intact; no apparent rashes or lesions.  Neurologic: Awake, alert, oriented to self, place and time with some cues.  Follows 3 out of 3 simple commands.  Obvious processing delays and memory deficits.  Cranial nerves II through XII grossly intact.  No sensory deficits.  Motor 4 out of 5 throughout bilateral upper and lower extremities.  Reflexes intact.  No Hoffmann's, Babinski.  No obvious  tone.  + Difficulty with attention, processing delays + Significant right sided inattention   Assessment/Plan: 1. Functional deficits which require 3+ hours per day of interdisciplinary therapy in a comprehensive inpatient rehab setting. Physiatrist is providing close team supervision and 24 hour management of active medical problems listed below. Physiatrist and rehab team continue to assess barriers to discharge/monitor patient progress toward functional and medical goals  Care Tool:  Bathing    Body parts bathed by patient: Right arm, Chest, Abdomen, Left upper leg, Right upper leg   Body parts bathed by helper: Right lower leg, Left lower leg, Buttocks, Front perineal area, Left arm, Face     Bathing assist Assist Level: Maximal Assistance - Patient 24 - 49%     Upper Body Dressing/Undressing Upper body dressing   What is the patient wearing?: Dress    Upper body assist Assist Level: Total Assistance - Patient < 25%    Lower Body Dressing/Undressing Lower body dressing      What is the patient wearing?: Incontinence brief     Lower body assist Assist for lower body dressing: Total Assistance - Patient < 25%     Toileting Toileting    Toileting assist Assist for  toileting: Maximal Assistance - Patient 25 - 49%     Transfers Chair/bed transfer  Transfers assist     Chair/bed transfer assist level: Minimal Assistance - Patient > 75%     Locomotion Ambulation   Ambulation assist      Assist level: Minimal Assistance - Patient > 75% Assistive device: No Device Max distance: 100   Walk 10 feet activity   Assist     Assist level: Minimal Assistance - Patient > 75% Assistive device: No Device   Walk 50 feet activity   Assist    Assist level: Minimal Assistance - Patient > 75% Assistive device: No Device    Walk 150 feet activity   Assist Walk 150 feet activity did not occur: Safety/medical concerns         Walk 10 feet on uneven  surface  activity   Assist     Assist level: Moderate Assistance - Patient - 50 - 74% Assistive device: Hand held assist   Wheelchair     Assist Is the patient using a wheelchair?: Yes Type of Wheelchair: Manual    Wheelchair assist level: Dependent - Patient 0% Max wheelchair distance: 150    Wheelchair 50 feet with 2 turns activity    Assist        Assist Level: Dependent - Patient 0%   Wheelchair 150 feet activity     Assist      Assist Level: Dependent - Patient 0%   Blood pressure (!) 135/57, pulse 65, temperature 99.2 F (37.3 C), temperature source Oral, resp. rate 16, height 4\' 9"  (1.448 m), weight 49.7 kg, SpO2 96%.  Medical Problem List and Plan: 1. 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  Status post bur hole 01/30/2024             - patient may shower             - ELOS/Goals: 10 to 14 days, CGA PT/OT, min assist SLP - 4/25             - Stable to contnue IRF  - 4/15: Worsening truncal ataxia and awareness compared to prior IRF admission. Max cueing for initiation of any ADL tasks. Global intattetnion R>L. Min A for mobility with very poor sitting balance . Min A transfers and amb 175 ft. Worse dysarthria and apraxia.   2.  Antithrombotics: -DVT/anticoagulation:  Mechanical: Antiembolism stockings, thigh (TED hose) Bilateral lower extremities             -antiplatelet therapy: N/A 3. Pain Management: Tylenol as needed 4. Mood/Behavior/Sleep: continue Aricept 10 mg nightly, Zoloft 25 mg daily, melatonin 5 mg nightly              -antipsychotic agents: N/A   - 4/15: Add sleep log, add delirium precautions  - 4/16: ***  5. Neuropsych/cognition: This patient is not capable of making decisions on her own behalf.   - 4/15: Ritalin 5 mg BID for poor attention and lethargy  6. Skin/Wound Care: Routine skin checks 7. Fluids/Electrolytes/Nutrition/dysphagia: Routine in and outs with  follow-up chemistries   - 4/14: Creatinine mildly elevated, BUN stable, p.o. fluid intakes are excellent. Repeat in 2 days to trend--improved 4/16   - on Dys 3 diet with full supervision   8.  Antiphospholipid syndrome/history of DVT/thrombocytopenia secondary to antiphospholipid syndrome.  Followed by hematology services.  Chronic ELIQUIS REMAINS ON HOLD FOR AT LEAST TWO WEEKS GIVEN SDH WITH FOLLOW UP CT  HEAD (4/25).   - 4/14: Thrombocytopenia improving.  9.  Hypothyroidism. continue Synthroid  10.  Hyperlipidemia.  continue Crestor  11.  Diabetes mellitus retinopathy/legally blind.  Latest hemoglobin A1c 4.8.  SSI   - BG well controlled,  Recent Labs    02/23/24 1709 02/23/24 2041 02/24/24 0555  GLUCAP 134* 144* 91      12. Bradycardia: continue to monitor HR TID-not recurred  - Heart rate and vital stable  13.  Anemia.  Iron supplement added 4/12   -4/14: HgB stable 9-10  14.  Urinary incontinence/frequency overnight.  Start low-dose Ditropan 2.5 mg nightly.  LOS: 5 days A FACE TO FACE EVALUATION WAS PERFORMED  Alison Lopez 02/24/2024, 8:27 AM

## 2024-02-24 NOTE — Plan of Care (Signed)
  Problem: Consults Goal: RH BRAIN INJURY PATIENT EDUCATION Description: Description: See Patient Education module for eduction specifics Outcome: Progressing   Problem: RH BOWEL ELIMINATION Goal: RH STG MANAGE BOWEL WITH ASSISTANCE Description: STG Manage Bowel with minimal  Assistance. Outcome: Progressing   Problem: RH BLADDER ELIMINATION Goal: RH STG MANAGE BLADDER WITH ASSISTANCE Description: STG Manage Bladder With minimal  Assistance Outcome: Progressing   Problem: RH SKIN INTEGRITY Goal: RH STG SKIN FREE OF INFECTION/BREAKDOWN Description: Manage skin free of infection/breakdown with minimal assistance from daughter Outcome: Progressing   Problem: RH SAFETY Goal: RH STG ADHERE TO SAFETY PRECAUTIONS W/ASSISTANCE/DEVICE Description: STG Adhere to Safety Precautions With minimal Assistance/Device. Outcome: Progressing   Problem: RH COGNITION-NURSING Goal: RH STG USES MEMORY AIDS/STRATEGIES W/ASSIST TO PROBLEM SOLVE Description: STG Uses Memory Aids/Strategies With minimal Assistance to Problem Solve. Outcome: Progressing   Problem: RH PAIN MANAGEMENT Goal: RH STG PAIN MANAGED AT OR BELOW PT'S PAIN GOAL Outcome: Progressing   Problem: RH KNOWLEDGE DEFICIT BRAIN INJURY Goal: RH STG INCREASE KNOWLEDGE OF SELF CARE AFTER BRAIN INJURY Description: Manage increase  knowledge of self care after brain injury with minimal assistance  from daughter using educational materials provided Outcome: Progressing   Problem: Consults Goal: RH STROKE PATIENT EDUCATION Description: See Patient Education module for education specifics  Outcome: Progressing

## 2024-02-24 NOTE — Progress Notes (Signed)
 Physical Therapy Session Note  Patient Details  Name: Alison Lopez MRN: [id number] Date of Birth: 04-04-1956  Today's Date: 02/23/2024 PT Individual Time: 1421-1531 PT Individual Time Calculation: 70 min  Short Term Goals: Week 1:  PT Short Term Goal 1 (Week 1): STGs = LTGs   Skilled Therapeutic Interventions/Progress Updates:  Patient supine in bed on entrance to room. Patient alert and agreeable to PT session. Time spent with pt to increase comfort and build therapeutic alliance.   Patient with no pain complaint at start of session. "Just tired."  Disposable pants acquired d/t none located in room.   Therapeutic Activity: Bed Mobility: Pt performed supine > sit with MinA. VC/ tc required for technique and effort. Seated balance fair with light LOB several times while lifting LE to thread into pant legs. Pt able to correct all but one requiring MinA to return to upright posture.  Transfers: Pt performed sit<>stand and stand pivot transfers throughout session requiring MinA up to Mod/ MaxA primarily for balance. Pt with consistent bias posteriorly and to R side once reaching upright stance. Several hard sits back to w/c seat with attempts to stand and hard pull back and to her R side. Unable to follow instructions during LOB using cues to correct.   Pt transported to main therapy gym dependently via w/c.   Gait Training/ NMR:  Pt ambulated 95' x2/ 165' x1 using HHA with CGA/MinA for upright posture and balance throughout. Demonstrated improved balance when pausing and positioning into "ready stance" and "dancing". Otherwise variable pull noted posteriorly and to R. Provided vc/ tc for focus to task, upright posture, slight forward lean.  NMR facilitated during session with focus on standing balance and motor control. Pt guided in blocked practice of sit<>stand performance, contralateral reaching to L. Pt instructed to reach standing with no hands on assist. Pt relates ease of task and  upon rise and release of w/c armrests, pt again with LOB posteriorly and to R. Pull in that direction increases upon reaching upright posture. With pull to stand, performance improves. NDT cueing provided at ribcage/ posterior trunk for forward lean and hip hinge for increased ability to attain balance on stance. Then guided in RUE reach across body to items placed on L side. Pt able to improve awareness/ attn to Rhand and reach item to hand off to therapist with minimal balance loss to R side. Improved overall balance while in activity.   NMR performed for improvements in motor control and coordination, balance, sequencing, judgement, and self confidence/ efficacy in performing all aspects of mobility at highest level of independence.   Patient supine in bed at end of session with brakes locked, bed alarm set, and all needs within reach.   Therapy Documentation Precautions:  Precautions Precautions: Fall Recall of Precautions/Restrictions: Impaired Precaution/Restrictions Comments: legally blind Restrictions Weight Bearing Restrictions Per Provider Order: No  Pain:  No pain related this session.   Therapy/Group: Individual Therapy  Donne Gage PT, DPT, CSRS 02/23/2024, 5:25 PM

## 2024-02-24 NOTE — Progress Notes (Signed)
 Patient ID: Alison Lopez, female   DOB: 09/13/1956, 68 y.o.   MRN: [id number]  SW faxed CAP/DA application to [facility] [phone]).  Norval Been, MSW, LCSW Office: [phone] Cell: [phone] Fax: [phone]

## 2024-02-25 LAB — GLUCOSE, CAPILLARY
Glucose-Capillary: 90 mg/dL (ref 70–99)
Glucose-Capillary: 120 mg/dL — ABNORMAL HIGH (ref 70–99)
Glucose-Capillary: 122 mg/dL — ABNORMAL HIGH (ref 70–99)
Glucose-Capillary: 100 mg/dL — ABNORMAL HIGH (ref 70–99)

## 2024-02-25 NOTE — Progress Notes (Signed)
 PROGRESS NOTE   Subjective/Complaints: No acute complaints.  No events overnight.  Resting in bed, no once today. Vitals stable Incontinent b/b, LBM 4/16  ROS: +impaired cognition, denies pain   Objective:   No results found. No results for input(s): "WBC", "HGB", "HCT", "PLT" in the last 72 hours.  Recent Labs    02/24/24 0531  NA 138  K 4.1  CL 111  CO2 24  GLUCOSE 93  BUN 17  CREATININE 0.94  CALCIUM 8.5*     Intake/Output Summary (Last 24 hours) at 02/25/2024 0940 Last data filed at 02/25/2024 0830 Gross per 24 hour  Intake 520 ml  Output --  Net 520 ml        Physical Exam: Vital Signs Blood pressure 128/75, pulse 66, temperature 98.7 F (37.1 C), temperature source Oral, resp. rate 18, height 4\' 9"  (1.448 m), weight 49.7 kg, SpO2 100%. Gen: no distress, normal appearing.  Laying in bed. HEENT: oral mucosa pink and moist, NCAT.  Bilateral blindness, baseline. Cardio: Reg rate and rhythm, systolic murmur. Chest: normal effort, normal rate of breathing, clear to auscultation bilaterally. Abd: soft, non-distended.  Positive bowel sounds. Ext: no edema.  No deformity. Psych: pleasant, normal affect Skin: intact; no apparent rashes or lesions.  Neurologic: Awake, alert, oriented to self, place and time with some cues.  Follows 3 out of 3 simple commands.  Obvious processing delays and memory deficits.  Cranial nerves II through XII grossly intact.  No sensory deficits.  Motor 4 out of 5 throughout bilateral upper and lower extremities.  Reflexes intact.  No Hoffmann's, Babinski.  No obvious tone.  No coordination deficits. + Difficulty with attention, processing delays--improving daily. + Right sided inattention improving.    Assessment/Plan: 1. Functional deficits which require 3+ hours per day of interdisciplinary therapy in a comprehensive inpatient rehab setting. Physiatrist is providing close team  supervision and 24 hour management of active medical problems listed below. Physiatrist and rehab team continue to assess barriers to discharge/monitor patient progress toward functional and medical goals  Care Tool:  Bathing    Body parts bathed by patient: Right arm, Chest, Abdomen, Left upper leg, Right upper leg   Body parts bathed by helper: Right lower leg, Left lower leg, Buttocks, Front perineal area, Left arm, Face     Bathing assist Assist Level: Maximal Assistance - Patient 24 - 49%     Upper Body Dressing/Undressing Upper body dressing   What is the patient wearing?: Dress    Upper body assist Assist Level: Total Assistance - Patient < 25%    Lower Body Dressing/Undressing Lower body dressing      What is the patient wearing?: Incontinence brief     Lower body assist Assist for lower body dressing: Total Assistance - Patient < 25%     Toileting Toileting    Toileting assist Assist for toileting: Maximal Assistance - Patient 25 - 49%     Transfers Chair/bed transfer  Transfers assist     Chair/bed transfer assist level: Minimal Assistance - Patient > 75%     Locomotion Ambulation   Ambulation assist      Assist level: Minimal Assistance -  Patient > 75% Assistive device: No Device Max distance: 100   Walk 10 feet activity   Assist     Assist level: Minimal Assistance - Patient > 75% Assistive device: No Device   Walk 50 feet activity   Assist    Assist level: Minimal Assistance - Patient > 75% Assistive device: No Device    Walk 150 feet activity   Assist Walk 150 feet activity did not occur: Safety/medical concerns         Walk 10 feet on uneven surface  activity   Assist     Assist level: Moderate Assistance - Patient - 50 - 74% Assistive device: Hand held assist   Wheelchair     Assist Is the patient using a wheelchair?: Yes Type of Wheelchair: Manual    Wheelchair assist level: Dependent - Patient  0% Max wheelchair distance: 150    Wheelchair 50 feet with 2 turns activity    Assist        Assist Level: Dependent - Patient 0%   Wheelchair 150 feet activity     Assist      Assist Level: Dependent - Patient 0%   Blood pressure 128/75, pulse 66, temperature 98.7 F (37.1 C), temperature source Oral, resp. rate 18, height 4\' 9"  (1.448 m), weight 49.7 kg, SpO2 100%.  Medical Problem List and Plan: 1. 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  Status post bur hole 01/30/2024             - patient may shower             - ELOS/Goals: 10 to 14 days, CGA PT/OT, min assist SLP - 4/25             - Stable to contnue IRF  - 4/15: Worsening truncal ataxia and awareness compared to prior IRF admission. Max cueing for initiation of any ADL tasks. Global intattetnion R>L. Min A for mobility with very poor sitting balance . Min A transfers and amb 175 ft. Worse dysarthria and apraxia.   2.  Antithrombotics: -DVT/anticoagulation:  Mechanical: Antiembolism stockings, thigh (TED hose) Bilateral lower extremities             -antiplatelet therapy: N/A 3. Pain Management: Tylenol as needed 4. Mood/Behavior/Sleep: continue Aricept 10 mg nightly, Zoloft 25 mg daily, melatonin 5 mg nightly              -antipsychotic agents: N/A   - 4/15: Add sleep log, add delirium precautions  - 4/16: Sleep log appropriate, 10 hours overnight 4-17: DC sleep log, sleeping appropriately.  5. Neuropsych/cognition: This patient is not capable of making decisions on her own behalf.   - 4/15: Ritalin 5 mg BID for poor attention and lethargy--seems to have done a little better with this  6. Skin/Wound Care: Routine skin checks 7. Fluids/Electrolytes/Nutrition/dysphagia: Routine in and outs with follow-up chemistries   - 4/14: Creatinine mildly elevated, BUN stable, p.o. fluid intakes are excellent. Repeat in 2 days to trend--improved 4/16   - on Dys  3 diet with full supervision   8.  Antiphospholipid syndrome/history of DVT/thrombocytopenia secondary to antiphospholipid syndrome.  Followed by hematology services.  Chronic ELIQUIS REMAINS ON HOLD FOR AT LEAST TWO WEEKS GIVEN SDH WITH FOLLOW UP CT HEAD (4/25).   - 4/14: Thrombocytopenia improving.  9.  Hypothyroidism. continue Synthroid  10.  Hyperlipidemia.  continue Crestor  11.  Diabetes mellitus retinopathy/legally blind.  Latest hemoglobin A1c 4.8.  SSI   - BG well controlled,  Recent Labs    02/24/24 1706 02/24/24 2058 02/25/24 0611  GLUCAP 75 100* 90      12. Bradycardia: continue to monitor HR TID-not recurred  - Heart rate and vital stable  13.  Anemia.  Iron supplement added 4/12   -4/14: HgB stable 9-10  14.  Urinary incontinence/frequency overnight.  Start low-dose Ditropan 2.5 mg nightly.  - 4-17: Monitor 1 to 2 days; would not further dose adjust due to cognitive risk factors.  If no benefit, would DC Ditropan  LOS: 6 days A FACE TO FACE EVALUATION WAS PERFORMED  Alison Lopez 02/25/2024, 9:40 AM

## 2024-02-25 NOTE — Progress Notes (Addendum)
 Occupational Therapy Session Note  Patient Details  Name: Alison Lopez MRN: [id number] Date of Birth: April 19, 1956  Today's Date: 02/25/2024 OT Individual Time: 6962-9528 OT Individual Time Calculation (min): 28 min    Short Term Goals: Week 1:  OT Short Term Goal 1 (Week 1): Pt will don shirt with mod A OT Short Term Goal 2 (Week 1): Pt will don pants with mod A OT Short Term Goal 3 (Week 1): Pt will complete stand pivot to the toilet with min A OT Short Term Goal 4 (Week 1): Pt will attend to right side of body in ADL task with mod cues  Skilled Therapeutic Interventions/Progress Updates:  Pt received in the middle of meal consumption with NT. Pt with no reports of pain, receptive to therapy session. Pt transitions to EOB with Mod A for BLE management and trunk elevation. Pt requires overall Min A for sitting balance to finish eating lunch. Pt total A for majority of feeding, able to self feed peach cobbler (due to viscosity) with HOH due to visual impairments. Pt requires Max A for donning slippers, stand-pivot from EOB>TIS WC with Min A + no AD. Seated at sink-side, pt completes oral care and face washing with A provided for thoroughness/encouragements. Pt remained sitting in TIS WC at nurses station for socialization awaiting next therapist.   Therapy Documentation Precautions:  Precautions Precautions: Fall Recall of Precautions/Restrictions: Impaired Precaution/Restrictions Comments: legally blind Restrictions Weight Bearing Restrictions Per Provider Order: No   Therapy/Group: Individual Therapy  Artemus Biles, OTR/L, MSOT  02/25/2024, 6:33 AM

## 2024-02-25 NOTE — Plan of Care (Signed)
  Problem: Consults Goal: RH BRAIN INJURY PATIENT EDUCATION Description: Description: See Patient Education module for eduction specifics Outcome: Progressing   Problem: RH BOWEL ELIMINATION Goal: RH STG MANAGE BOWEL WITH ASSISTANCE Description: STG Manage Bowel with minimal  Assistance. Outcome: Not Progressing   Problem: RH BLADDER ELIMINATION Goal: RH STG MANAGE BLADDER WITH ASSISTANCE Description: STG Manage Bladder With minimal  Assistance Outcome: Not Progressing   Problem: RH SKIN INTEGRITY Goal: RH STG SKIN FREE OF INFECTION/BREAKDOWN Description: Manage skin free of infection/breakdown with minimal assistance from daughter Outcome: Progressing   Problem: RH SAFETY Goal: RH STG ADHERE TO SAFETY PRECAUTIONS W/ASSISTANCE/DEVICE Description: STG Adhere to Safety Precautions With minimal Assistance/Device. Outcome: Progressing   Problem: RH COGNITION-NURSING Goal: RH STG USES MEMORY AIDS/STRATEGIES W/ASSIST TO PROBLEM SOLVE Description: STG Uses Memory Aids/Strategies With minimal Assistance to Problem Solve. Outcome: Progressing   Problem: RH PAIN MANAGEMENT Goal: RH STG PAIN MANAGED AT OR BELOW PT'S PAIN GOAL Outcome: Progressing   Problem: RH KNOWLEDGE DEFICIT BRAIN INJURY Goal: RH STG INCREASE KNOWLEDGE OF SELF CARE AFTER BRAIN INJURY Description: Manage increase  knowledge of self care after brain injury with minimal assistance  from daughter using educational materials provided Outcome: Progressing

## 2024-02-25 NOTE — Plan of Care (Signed)
  Problem: Consults Goal: RH BRAIN INJURY PATIENT EDUCATION Description: Description: See Patient Education module for eduction specifics Outcome: Progressing   Problem: RH BOWEL ELIMINATION Goal: RH STG MANAGE BOWEL WITH ASSISTANCE Description: STG Manage Bowel with minimal  Assistance. Outcome: Progressing   Problem: RH BLADDER ELIMINATION Goal: RH STG MANAGE BLADDER WITH ASSISTANCE Description: STG Manage Bladder With minimal  Assistance Outcome: Progressing   Problem: RH SKIN INTEGRITY Goal: RH STG SKIN FREE OF INFECTION/BREAKDOWN Description: Manage skin free of infection/breakdown with minimal assistance from daughter Outcome: Progressing   Problem: RH SAFETY Goal: RH STG ADHERE TO SAFETY PRECAUTIONS W/ASSISTANCE/DEVICE Description: STG Adhere to Safety Precautions With minimal Assistance/Device. Outcome: Progressing   Problem: RH COGNITION-NURSING Goal: RH STG USES MEMORY AIDS/STRATEGIES W/ASSIST TO PROBLEM SOLVE Description: STG Uses Memory Aids/Strategies With minimal Assistance to Problem Solve. Outcome: Progressing   Problem: RH PAIN MANAGEMENT Goal: RH STG PAIN MANAGED AT OR BELOW PT'S PAIN GOAL Outcome: Progressing   Problem: RH KNOWLEDGE DEFICIT BRAIN INJURY Goal: RH STG INCREASE KNOWLEDGE OF SELF CARE AFTER BRAIN INJURY Description: Manage increase  knowledge of self care after brain injury with minimal assistance  from daughter using educational materials provided Outcome: Progressing   Problem: Consults Goal: RH STROKE PATIENT EDUCATION Description: See Patient Education module for education specifics  Outcome: Progressing

## 2024-02-25 NOTE — Progress Notes (Signed)
 Speech Language Pathology Daily Session Note  Patient Details  Name: Alison Lopez MRN: [id number] Date of Birth: 30-Jan-1956  Today's Date: 02/25/2024 SLP Individual Time: 0812-0900 SLP Individual Time Calculation (min): 48 min and Today's Date: 02/25/2024 SLP Missed Time: 8 Minutes Missed Time Reason: Other (Comment)delay in care  Short Term Goals: Week 1: SLP Short Term Goal 1 (Week 1): Pt will complete functional naming tasks w/ modA SLP Short Term Goal 2 (Week 1): Pt will utilize compensatory speech strategies at sentence level w/ maxA SLP Short Term Goal 3 (Week 1): Pt will follow one step commands during functional cognitive tasks w/ modA SLP Short Term Goal 4 (Week 1): pt will solve problems during functional cognitive tasks w/ maxA  Skilled Therapeutic Interventions:   Pt greeted at bedside. She was awake/alert upon SLP arrival and very pleasant throughout tx tasks targeting cognitive-linguistic skills and speech production. During initial conversation, she benefited from modA cues for sentence formulation and word finding. She benefited from 2 choices to verbalize orientation information w/ 100% accuracy. During mildly specific responsive naming task, she benefited from [facility] for word finding. She remains the most stimulable to semantic and sentence completion cues. She benefited from modA to follow one step commands during PO intake (coffee via straw). Of note, no overt s/s of airway invasion evident w/ thin liquids. She demonstrated slightly improved speech intelligibility as compared to yesterday, as she benefited from only supervision cues to maintain 90% intelligibility or greater. At the end of tx tasks, she was left in her bed with the alarm set and call light within reach. Recommend cont ST.   Pain  No pain reported  Therapy/Group: Individual Therapy  [name] 02/25/2024, 10:25 AM

## 2024-02-25 NOTE — Progress Notes (Signed)
 Physical Therapy Session Note  Patient Details  Name: Alison Lopez MRN: [id number] Date of Birth: 20-Aug-1956  Today's Date: 02/25/2024 PT Individual Time: 8295-6213 and 1401-1445 PT Individual Time Calculation (min): 73 min and 44 min.  Short Term Goals: Week 1:  PT Short Term Goal 1 (Week 1): STGs = LTGs  Skilled Therapeutic Interventions/Progress Updates:   First session:  Pt presents supine in bed and agreeable to therapy.  Pt transferred sup to sit w/ min A but reaching for PT assist.  Pt required A to don pants and sit to stand w/ min A and cues for hand placement.  Pt required total A for donning slippers w/ retropulsion.  Pt transferred sit to stand and SPT to TIS w/ min /Mod A and constant verbal cues for safety, pt impulsive and legally blind.  Pt wheeled to small gym.  Pt transfers sit to stand w/ min A and attempting toe taps to 6" platform, but pt unable to comprehend/ visual deficits.  Pt returned to room 2/2 soiled brief.  Pt amb into BR w/ R HHA and minA.  Pt required A for clothing management and incontinent of bowel, charted in Flowsheets.  NT assist for pericare w/ standing.  Pt returned to main gym.  Pt amb multiple trials of 150' including turns, giving directions for turning and visual scanning.  Pt required min A, but increased w/ approach to seat, 2/2 turning too quickly and sidestepping to w/c.  Pt returned to room and amb x 50' into room to bed w/ increased A and cues as approaches bed.  Pt transfers sit to supine w/ CGA.  Pt lying in L sidelying position, bed alarm on and all needs in reach.  Second session:  Pt presents sitting in TIS in nursing station, agreeable to therapy.  Pt wheeled to dayroom for time conservation.  Pt performed blocks of sit to stand transfers w/ min A and cues for forward scoot as pt tips TIS w/ standing.  Pt requires verbal and tactile cues for forward lean to avoid LLE extension w/ stand to sit.  Pt performs multiple SPT w/c <> mat table w/ min  A and cues for safe stand to sit .  Pt tends to get L hand stuck underneath body, unless cued for proper placement.  Pt does retropulse in sitting, requiring assist to regain.  Pt amb up to 180' w/ min/HHA and verbal and visual cues for direction.  LLE ER during gait.  Cues provided for posture and visual scanning to return to w/c.  Pt amb to room and requires cues to continue to [facility] before attempting to sit.  Pt transfers sit to supine w/ CGA, returns to L sidelying/fetal position.  Bed alarm on and all needs in reach.     Therapy Documentation Precautions:  Precautions Precautions: Fall Recall of Precautions/Restrictions: Impaired Precaution/Restrictions Comments: legally blind Restrictions Weight Bearing Restrictions Per Provider Order: No General:   Vital Signs:   Pain: no c/o both sessions.       Therapy/Group: Individual Therapy  Louise Rawson P Xaniyah Buchholz 02/25/2024, 12:06 PM

## 2024-02-26 ENCOUNTER — Encounter: Payer: Self-pay | Admitting: Neurosurgery

## 2024-02-26 LAB — URINALYSIS, ROUTINE W REFLEX MICROSCOPIC
Bilirubin Urine: NEGATIVE
Glucose, UA: NEGATIVE mg/dL
Hgb urine dipstick: NEGATIVE
Ketones, ur: NEGATIVE mg/dL
Nitrite: NEGATIVE
Protein, ur: NEGATIVE mg/dL
Specific Gravity, Urine: 1.026 (ref 1.005–1.030)
WBC, UA: >50 WBC/hpf (ref 0–5)
pH: 5 (ref 5.0–8.0)

## 2024-02-26 LAB — BASIC METABOLIC PANEL WITH GFR
Anion gap: 11 (ref 5–15)
BUN: 17 mg/dL (ref 8–23)
CO2: 20 mmol/L — ABNORMAL LOW (ref 22–32)
Calcium: 8.7 mg/dL — ABNORMAL LOW (ref 8.9–10.3)
Chloride: 107 mmol/L (ref 98–111)
Creatinine, Ser: 1 mg/dL (ref 0.44–1.00)
GFR, Estimated: >60 mL/min (ref >60)
Glucose, Bld: 130 mg/dL — ABNORMAL HIGH (ref 70–99)
Potassium: 4 mmol/L (ref 3.5–5.1)
Sodium: 138 mmol/L (ref 135–145)

## 2024-02-26 LAB — CBC WITH DIFFERENTIAL/PLATELET
Abs Immature Granulocytes: 0.03 10*3/uL (ref 0.00–0.07)
Basophils Absolute: 0.1 10*3/uL (ref 0.0–0.1)
Basophils Relative: 1 %
Eosinophils Absolute: 0.2 10*3/uL (ref 0.0–0.5)
Eosinophils Relative: 3 %
HCT: 32.7 % — ABNORMAL LOW (ref 36.0–46.0)
Hemoglobin: 10.5 g/dL — ABNORMAL LOW (ref 12.0–15.0)
Immature Granulocytes: 1 %
Lymphocytes Relative: 16 %
Lymphs Abs: 1.1 10*3/uL (ref 0.7–4.0)
MCH: 29.4 pg (ref 26.0–34.0)
MCHC: 32.1 g/dL (ref 30.0–36.0)
MCV: 91.6 fL (ref 80.0–100.0)
Monocytes Absolute: 0.6 10*3/uL (ref 0.1–1.0)
Monocytes Relative: 10 %
Neutro Abs: 4.5 10*3/uL (ref 1.7–7.7)
Neutrophils Relative %: 69 %
Platelets: 84 10*3/uL — ABNORMAL LOW (ref 150–400)
RBC: 3.57 MIL/uL — ABNORMAL LOW (ref 3.87–5.11)
RDW: 13.3 % (ref 11.5–15.5)
WBC: 6.5 10*3/uL (ref 4.0–10.5)
nRBC: 0 % (ref 0.0–0.2)

## 2024-02-26 LAB — GLUCOSE, CAPILLARY
Glucose-Capillary: 98 mg/dL (ref 70–99)
Glucose-Capillary: 112 mg/dL — ABNORMAL HIGH (ref 70–99)
Glucose-Capillary: 105 mg/dL — ABNORMAL HIGH (ref 70–99)
Glucose-Capillary: 190 mg/dL — ABNORMAL HIGH (ref 70–99)

## 2024-02-26 MED ORDER — LIDOCAINE VISCOUS HCL 2 % MT SOLN: 15.0000 mL | Freq: Four times a day (QID) | OROMUCOSAL | Status: DC | PRN

## 2024-02-26 NOTE — Progress Notes (Signed)
 Speech Language Pathology Daily Session Note  Patient Details  Name: Alison Lopez MRN: [id number] Date of Birth: [birth year]  Today's Date: 02/26/2024 SLP Individual Time: 0900-1000 SLP Individual Time Calculation (min): 60 min  Short Term Goals: Week 1: SLP Short Term Goal 1 (Week 1): Pt will complete functional naming tasks w/ modA SLP Short Term Goal 2 (Week 1): Pt will utilize compensatory speech strategies at sentence level w/ maxA SLP Short Term Goal 3 (Week 1): Pt will follow one step commands during functional cognitive tasks w/ modA SLP Short Term Goal 4 (Week 1): pt will solve problems during functional cognitive tasks w/ maxA  Skilled Therapeutic Interventions:  Patient was seen for skilled ST services targeting speech and language.  Patient was sitting in chair upon SLP entry.  Patient participated in word finding exercise -semantic feature analysis (SFA).  SLP provided prompt for patient to answer regarding personally relevant objects.  Patient required modA to verbalize features of objects given. Pt shared her favorite dessert was peach cobbler and participated in divergent naming task for ingredients needed, requiring modA verbal cues. Speech intelligibility was observed to be <70% today. Pt required consistent cues throughout session to speak louder. Pt was left in room, all safety measures activated, and immediate needs within reach. Cont with current ST POC.   **Reported odynophagia and was noted to grimace with consumption of thin liquids. Doc notified.   Pain Pain Assessment Pain Score: 2  (with swallowing) Pain Location: Heel  Therapy/Group: Individual Therapy  Lacinda KANDICE Lorenzo 02/26/2024, 10:02 AM

## 2024-02-26 NOTE — Progress Notes (Signed)
 Occupational Therapy Session Note  Patient Details  Name: Alison Lopez MRN: [id number] Date of Birth: [birth year]  Today's Date: 02/26/2024 OT Individual Time: 5784-6962 OT Individual Time Calculation (min): 56 min    Short Term Goals: Week 2:  OT Short Term Goal 1 (Week 2): STG= LTG d/t ELOS  Skilled Therapeutic Interventions/Progress Updates:    Pt received supine with no c/o pain, agreeable to OT session. She came to EOB with (S)- no physical assist needed! She stood from EOB with min A. Ambulatory transfer to the TIS with min A. Pt was taken via w/c to the therapy gym for time management. She participated in therapeutic dance group, focused on standing level dynamic balance trials with pt rhythmically moving feet and hips. Min-mod A overall provided. Facilitation and frequent cueing required to integrate the RUE.  She also worked on Designer, fashion/clothing and taking turns in a group. Pt even able to request a song that was appropriate for the group. Psychosocial benefits from being in a group as well. She was left with rehab tech in dance group, sitting in TIS w/c.   Therapy Documentation Precautions:  Precautions Precautions: Fall Recall of Precautions/Restrictions: Impaired Precaution/Restrictions Comments: legally blind Restrictions Weight Bearing Restrictions Per Provider Order: No  Therapy/Group: Individual Therapy  [name] 02/26/2024, 2:24 PM

## 2024-02-26 NOTE — Progress Notes (Signed)
 Patient ID: ARAH ARO, female   DOB: 1956/09/16, 68 y.o.   MRN: [id number]  1140-SW spoke with pt dt Amber to inform on CAP/DA forms in pt room. SW also discussed family edu. Fam edu scheduled for [facility] 1pm-4pm. Reports on discharge date, pt will be a late discharge as she has to work.   Norval Been, MSW, LCSW Office: [phone] Cell: [phone] Fax: [phone]

## 2024-02-26 NOTE — Plan of Care (Signed)
 Goals downgraded. Pt continues to be severely limited by motor planning deficits, ataxia, and overall cognitive deficits.   Problem: RH Eating Goal: LTG Patient will perform eating w/assist, cues/equip (OT) Description: LTG: Patient will perform eating with assist, with/without cues using equipment (OT) Flowsheets (Taken 02/26/2024 0758) LTG: Pt will perform eating with assistance level of: (downgraded 4/18- SD) Moderate Assistance - Patient 50 - 74%   Problem: RH Grooming Goal: LTG Patient will perform grooming w/assist,cues/equip (OT) Description: LTG: Patient will perform grooming with assist, with/without cues using equipment (OT) Flowsheets (Taken 02/26/2024 0758) LTG: Pt will perform grooming with assistance level of: (downgraded 4/18- SD) Moderate Assistance - Patient 50 - 74%   Problem: RH Bathing Goal: LTG Patient will bathe all body parts with assist levels (OT) Description: LTG: Patient will bathe all body parts with assist levels (OT) Flowsheets (Taken 02/26/2024 0758) LTG: Pt will perform bathing with assistance level/cueing: (downgraded 4/18- SD) Moderate Assistance - Patient 50 - 74%   Problem: RH Dressing Goal: LTG Patient will perform upper body dressing (OT) Description: LTG Patient will perform upper body dressing with assist, with/without cues (OT). Flowsheets (Taken 02/26/2024 0758) LTG: Pt will perform upper body dressing with assistance level of: (downgraded 4/18- SD) Moderate Assistance - Patient 50 - 74% Goal: LTG Patient will perform lower body dressing w/assist (OT) Description: LTG: Patient will perform lower body dressing with assist, with/without cues in positioning using equipment (OT) Flowsheets (Taken 02/26/2024 0758) LTG: Pt will perform lower body dressing with assistance level of: (downgraded 4/18- SD) Moderate Assistance - Patient 50 - 74%   Problem: RH Toileting Goal: LTG Patient will perform toileting task (3/3 steps) with assistance level  (OT) Description: LTG: Patient will perform toileting task (3/3 steps) with assistance level (OT)  Flowsheets (Taken 02/26/2024 0758) LTG: Pt will perform toileting task (3/3 steps) with assistance level: (downgraded 4/18- SD) Moderate Assistance - Patient 50 - 74%   Problem: RH Functional Use of Upper Extremity Goal: LTG Patient will use RT/LT upper extremity as a (OT) Description: LTG: Patient will use right/left upper extremity as a stabilizer/gross assist/diminished/nondominant/dominant level with assist, with/without cues during functional activity (OT) Flowsheets (Taken 02/26/2024 0758) LTG: Pt will use upper extremity in functional activity with assistance level of: (downgraded 4/18- SD) Minimal Assistance - Patient > 75%

## 2024-02-26 NOTE — Progress Notes (Signed)
 Occupational Therapy Weekly Progress Note  Patient Details  Name: Alison Lopez MRN: [id number] Date of Birth: 03-11-1956  Beginning of progress report period: February 20, 2024 End of progress report period: February 26, 2024  Today's Date: 02/26/2024 OT Individual Time: 9269-9184 OT Individual Time Calculation (min): 45 min    Patient has met 1 of 4 short term goals.  Alison Lopez has made little progress toward her OT goals d/t ongoing severe motor planning, sequencing ,and cognitive deficits that impact her ability to perform ALL ADLs. She is total-max A for self feeding and for all other ADLs. Since the extension of her SDH, her R inattention is also worse. Her standing balance is also worse and affected by truncal ataxia. She is able to complete transfers and mobility at a min A level overall. Alison Lopez works very hard in therapy and has a supportive daughter. Goals are going to be downgraded to mod A.   Patient continues to demonstrate the following deficits: muscle weakness, decreased cardiorespiratoy endurance, ataxia, decreased coordination, and decreased motor planning, decreased visual acuity, decreased midline orientation, decreased initiation, decreased attention, decreased awareness, decreased problem solving, decreased safety awareness, decreased memory, and delayed processing, and decreased sitting balance, decreased standing balance, decreased postural control, hemiplegia, and decreased balance strategies and therefore will continue to benefit from skilled OT intervention to enhance overall performance with BADL and Reduce care partner burden.  Patient not progressing toward long term goals.  See goal revision..  Continue plan of care.  OT Short Term Goals Week 1:  OT Short Term Goal 1 (Week 1): Pt will don shirt with mod A OT Short Term Goal 1 - Progress (Week 1): Not met OT Short Term Goal 2 (Week 1): Pt will don pants with mod A OT Short Term Goal 2 - Progress (Week 1): Not met OT  Short Term Goal 3 (Week 1): Pt will complete stand pivot to the toilet with min A OT Short Term Goal 3 - Progress (Week 1): Met OT Short Term Goal 4 (Week 1): Pt will attend to right side of body in ADL task with mod cues OT Short Term Goal 4 - Progress (Week 1): Not met Week 2:  OT Short Term Goal 1 (Week 2): STG= LTG d/t ELOS  Skilled Therapeutic Interventions/Progress Updates:    Pt received supine with no c/o pain, agreeable to OT session.  NT assisting with breakfast. Pt came to EOB with min A. She had slightly improved performance overall today. Functional mobility into the bathroom with min A to provide stability at the trunk d/t large perturbations with truncal ataxia. She required only min cueing to attempt and initiate clothing management. Max A for toileting tasks overall, including total A for hygiene. She had continent urine void! With BSC, trunk controlled much better and pt able to sit with (S). She returned to the TIS w/c to eat breakfast. She was unable to motor plan spearing food and once cued to use her fork she was perseverative on bringing it to her mouth. Even with max step by step cueing she had difficulty motor planning/sequencing. She did however demonstrate some improvement in fork plate to mouth sequencing when using her LUE. She ended with 100 ft of functional mobility, trialing the RW with no significant improvement d/t OT having to manage RW as much as trunk- mod A. Pt was left sitting up in the wheelchair with all needs met, chair alarm set, and call bell within reach.   Therapy Documentation Precautions:  Precautions Precautions: Fall Recall of Precautions/Restrictions: Impaired Precaution/Restrictions Comments: legally blind Restrictions Weight Bearing Restrictions Per Provider Order: No  Therapy/Group: Individual Therapy  Alison Lopez 02/26/2024, 7:50 AM

## 2024-02-26 NOTE — Progress Notes (Signed)
 Pt made attempt to void on the [facility]. UA collected via intermittent catheter. PVR after voiding completed. Did endorse some pain initially with catheter. With foley care genital area assessment observed purple pinpoint dots and raised skin tone areas in the genitalia area.

## 2024-02-26 NOTE — Progress Notes (Signed)
 Physical Therapy Session Note  Patient Details  Name: Alison Lopez MRN: [id number] Date of Birth: 01-14-1956  {CHL IP REHAB PT TIME CALCULATION:[id number]}  Short Term Goals: Week 1:  PT Short Term Goal 1 (Week 1): STGs = LTGs   Skilled Therapeutic Interventions/Progress Updates:  Patient *** on entrance to room. Patient alert and agreeable to PT session.   Patient with no pain complaint at start of session.  Therapeutic Activity: Bed Mobility: Pt performed supine <> sit with ***. VC/ tc required for ***. Transfers: Pt performed sit<>stand and stand pivot transfers throughout session with ***. Provided vc/ tc for***.  Gait Training:  Pt ambulated *** ft using *** with ***. Demonstrated ***. Provided vc/ tc for ***.  Neuromuscular Re-ed: NMR facilitated during session with focus on ***. Pt guided in ***. NMR performed for improvements in motor control and coordination, balance, sequencing, judgement, and self confidence/ efficacy in performing all aspects of mobility at highest level of independence.   Patient *** at end of session with brakes locked, *** alarm set, and all needs within reach.   Therapy Documentation Precautions:  Precautions Precautions: Fall Recall of Precautions/Restrictions: Impaired Precaution/Restrictions Comments: legally blind Restrictions Weight Bearing Restrictions Per Provider Order: No  Pain: No pain related this session.   Therapy/Group: Individual Therapy  Donne Gage PT, DPT, CSRS 02/26/2024, 10:10 AM

## 2024-02-26 NOTE — Progress Notes (Signed)
 PROGRESS NOTE   Subjective/Complaints:  Per PT foul urine this AM.  Remains mostly incontinent. BP remains with soft diastolic but overall stable.  LBM 4/17, large  Patient complaining of some pain in her throat today, difficulty swallowing.  Denies any fevers, chills, nausea, abdominal pain.  Does have some chronic, intermittent dysuria.   ROS: Positives per HPI above. Denies fevers, chills, N/V, abdominal pain, SOB, chest pain, new weakness or paraesthesias.     Objective:   No results found. No results for input(s): "WBC", "HGB", "HCT", "PLT" in the last 72 hours.  Recent Labs    02/24/24 0531  NA 138  K 4.1  CL 111  CO2 24  GLUCOSE 93  BUN 17  CREATININE 0.94  CALCIUM  8.5*     Intake/Output Summary (Last 24 hours) at 02/26/2024 0842 Last data filed at 02/26/2024 0820 Gross per 24 hour  Intake 300 ml  Output --  Net 300 ml        Physical Exam: Vital Signs Blood pressure (!) 126/51, pulse 67, temperature 98.4 F (36.9 C), temperature source Oral, resp. rate 18, height 4\' 9"  (1.448 m), weight 49.7 kg, SpO2 96%. Gen: no distress, normal appearing.  Sitting up in bedside chair. HEENT: oral mucosa pink and moist, NCAT.  Poor dentition, multiple caries.  No obvious erythema or drainage in the gums. Eyes: Bilateral blindness, baseline.  Mild anisocoria with left pupil slightly more dilated than right, does become equal with light.  Cardio: Reg rate and rhythm, systolic murmur.  Well-perfused appearance. Chest: normal effort, normal rate of breathing, clear to auscultation bilaterally. Abd: soft, non-distended.  Positive bowel sounds. Ext: no edema.  No deformity. Psych: pleasant, normal affect Skin: intact; no apparent rashes or lesions.  Neurologic: Awake, alert, oriented to self, place and time with some cues.  Follows all simple commands.  Obvious processing delays and memory deficits.   Cranial nerves:  +Mild R facial droop,   No sensory deficits.   Motor 4- out of 5 throughout R upper extremity, 4+/5 RLE; 5-/5 L upper and lower extremities Reflexes intact.  No Hoffmann's, Babinski.   No obvious tone.   No coordination deficits. + Difficulty with attention, processing delays--improving  + Right sided inattention -- ongoing    Assessment/Plan: 1. Functional deficits which require 3+ hours per day of interdisciplinary therapy in a comprehensive inpatient rehab setting. Physiatrist is providing close team supervision and 24 hour management of active medical problems listed below. Physiatrist and rehab team continue to assess barriers to discharge/monitor patient progress toward functional and medical goals  Care Tool:  Bathing    Body parts bathed by patient: Right arm, Chest, Abdomen, Left upper leg, Right upper leg   Body parts bathed by helper: Right lower leg, Left lower leg, Buttocks, Front perineal area, Left arm, Face     Bathing assist Assist Level: Maximal Assistance - Patient 24 - 49%     Upper Body Dressing/Undressing Upper body dressing   What is the patient wearing?: Dress    Upper body assist Assist Level: Total Assistance - Patient < 25%    Lower Body Dressing/Undressing Lower body dressing  What is the patient wearing?: Incontinence brief     Lower body assist Assist for lower body dressing: Total Assistance - Patient < 25%     Toileting Toileting    Toileting assist Assist for toileting: Maximal Assistance - Patient 25 - 49%     Transfers Chair/bed transfer  Transfers assist     Chair/bed transfer assist level: Minimal Assistance - Patient > 75%     Locomotion Ambulation   Ambulation assist      Assist level: Minimal Assistance - Patient > 75% Assistive device: Hand held assist Max distance: 180   Walk 10 feet activity   Assist     Assist level: Minimal Assistance - Patient > 75% Assistive device: Hand held assist    Walk 50 feet activity   Assist    Assist level: Minimal Assistance - Patient > 75% Assistive device: Hand held assist    Walk 150 feet activity   Assist Walk 150 feet activity did not occur: Safety/medical concerns  Assist level: Minimal Assistance - Patient > 75% Assistive device: Hand held assist    Walk 10 feet on uneven surface  activity   Assist     Assist level: Moderate Assistance - Patient - 50 - 74% Assistive device: Hand held assist   Wheelchair     Assist Is the patient using a wheelchair?: Yes Type of Wheelchair: Manual    Wheelchair assist level: Dependent - Patient 0% Max wheelchair distance: 150    Wheelchair 50 feet with 2 turns activity    Assist        Assist Level: Dependent - Patient 0%   Wheelchair 150 feet activity     Assist      Assist Level: Dependent - Patient 0%   Blood pressure (!) 126/51, pulse 67, temperature 98.4 F (36.9 C), temperature source Oral, resp. rate 18, height 4\' 9"  (1.448 m), weight 49.7 kg, SpO2 96%.  Medical Problem List and Plan: 1. 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  Status post bur hole 01/30/2024             - patient may shower             - ELOS/Goals: 10 to 14 days, CGA PT/OT, min assist SLP - 4/25             - Stable to contnue IRF  - 4/15: Worsening truncal ataxia and awareness compared to prior IRF admission. Max cueing for initiation of any ADL tasks. Global intattetnion R>L. Min A for mobility with very poor sitting balance . Min A transfers and amb 175 ft. Worse dysarthria and apraxia.   2.  Antithrombotics: -DVT/anticoagulation:  Mechanical: Antiembolism stockings, thigh (TED hose) Bilateral lower extremities             -antiplatelet therapy: N/A 3. Pain Management: Tylenol  as needed 4. Mood/Behavior/Sleep: continue Aricept  10 mg nightly, Zoloft  25 mg daily, melatonin 5 mg nightly              -antipsychotic  agents: N/A   - 4/15: Add sleep log, add delirium precautions  - 4/16: Sleep log appropriate, 10 hours overnight 4-17: DC sleep log, sleeping appropriately.  5. Neuropsych/cognition: This patient is not capable of making decisions on her own behalf.   - 4/15: Ritalin  5 mg BID for poor attention and lethargy--seems to have done a little better with this  6. Skin/Wound Care: Routine skin checks 7. Fluids/Electrolytes/Nutrition/dysphagia: Routine in and  outs with follow-up chemistries   - 4/14: Creatinine mildly elevated, BUN stable, p.o. fluid intakes are excellent. Repeat in 2 days to trend--improved 4/16   - on Dys 3 diet with full supervision  4/18: Labs stable   8.  Antiphospholipid syndrome/history of DVT/thrombocytopenia secondary to antiphospholipid syndrome.  Followed by hematology services.  Chronic ELIQUIS  REMAINS ON HOLD FOR AT LEAST TWO WEEKS GIVEN SDH WITH FOLLOW UP CT HEAD (4/25).   - 4/14: Thrombocytopenia improving.  9.  Hypothyroidism. continue Synthroid   10.  Hyperlipidemia.  continue Crestor   11.  Diabetes mellitus retinopathy/legally blind.  Latest hemoglobin A1c 4.8.  SSI   - BG well controlled,  Recent Labs    02/25/24 1702 02/25/24 2106 02/26/24 0614  GLUCAP 122* 100* 98      12. Bradycardia: continue to monitor HR TID-not recurred  - Heart rate and vital stable  13.  Anemia.  Iron  supplement added 4/12   -4/14: HgB stable 9-10  14.  Urinary incontinence/frequency overnight/dysuria.  Start low-dose Ditropan  2.5 mg nightly.  - 4-17: Monitor 1 to 2 days; would not further dose adjust due to cognitive risk factors.  If no benefit, would DC Ditropan   4/18: Reports of foul urinary output and patient now complaining of intermittent dysuria. DC ditropan . Afebrile, WBC stable, awaiting urinalysis with micro reflex. PVRs ordered.   15. Throat pain   - No cough or s/s infection   - Very poor dentition, monitor closely for s/s infection   - Add viscous  lidocaine  Q6H PRN  LOS: 7 days A FACE TO FACE EVALUATION WAS PERFORMED  Bea Lime 02/26/2024, 8:42 AM

## 2024-02-26 NOTE — Progress Notes (Signed)
 Provider made aware and informed of concerns with patient. Increased right facial droop observed after patient finished eating her lunch today. Vitals obtained.

## 2024-02-27 LAB — GLUCOSE, CAPILLARY
Glucose-Capillary: 90 mg/dL (ref 70–99)
Glucose-Capillary: 107 mg/dL — ABNORMAL HIGH (ref 70–99)
Glucose-Capillary: 120 mg/dL — ABNORMAL HIGH (ref 70–99)
Glucose-Capillary: 122 mg/dL — ABNORMAL HIGH (ref 70–99)

## 2024-02-27 NOTE — Progress Notes (Signed)
 Speech Language Pathology Daily Session Note  Patient Details  Name: Alison Lopez MRN: [id number] Date of Birth: 1956/06/12  Today's Date: 02/27/2024 SLP Individual Time: 3016-0109 SLP Individual Time Calculation (min): 45 min  Short Term Goals: Week 1: SLP Short Term Goal 1 (Week 1): Pt will complete functional naming tasks w/ modA SLP Short Term Goal 2 (Week 1): Pt will utilize compensatory speech strategies at sentence level w/ maxA SLP Short Term Goal 3 (Week 1): Pt will follow one step commands during functional cognitive tasks w/ modA SLP Short Term Goal 4 (Week 1): pt will solve problems during functional cognitive tasks w/ maxA  Skilled Therapeutic Interventions: Skilled intervention focused on cognition. Pt completed simple word finding task with naming objects and places when given a description with mod A. She required increased time for processing verbal information and addional details to increase accuracy. She answered reasoning and problem solving questions related to date, day of the week and scheduled therapy times with mod-max A. Cont with therapy per plan of care.   Pain Pain Assessment Pain Scale: Faces Pain Score: 0-No pain  Therapy/Group: Individual Therapy  Armin [place]ing A Krystel Fletchall 02/27/2024, 9:08 AM

## 2024-02-27 NOTE — Progress Notes (Signed)
 Physical Therapy Weekly Progress Note  Patient Details  Name: Alison Lopez MRN: [id number] Date of Birth: [birth year]  Beginning of progress report period: February 20, 2024 End of progress report period: February 26, 2024  Today's Date: 02/26/2024   Patient has met 0 of 1 short term goals.  Pt making slow but appropriate progress towards goals and is on track to meet LTG. She has missed minimal time in therapy due to fatigue. She completes bed mobility with supervision, sit<>stand transfers with CGA, and stand<>pivot transfers with CGA/ MinA using HHA. She's ambulating >220ft with overall CGA using R HHA. She has also shown ability to navigate twelve 6-in steps with CGA overall using Bil handrails despite vc to use RHR only per home setup. She continues to be primarily limited by cognition, blindness, decreased dynamic standing balance, L hemibody ataxia, and gait impairments. Caregiver/ family  have not yet participated in family education to prepare for d/c home.   Patient continues to demonstrate the following deficits muscle weakness, decreased cardiorespiratoy endurance, impaired timing and sequencing, unbalanced muscle activation, and decreased coordination, decreased motor planning, decreased awareness, decreased safety awareness, decreased memory, and delayed processing, and decreased standing balance and decreased balance strategies and therefore will continue to benefit from skilled PT intervention to increase functional independence with mobility.  Patient progressing toward long term goals..  Continue plan of care.  PT Short Term Goals Week 1:  PT Short Term Goal 1 (Week 1): STGs = LTGs PT Short Term Goal 1 - Progress (Week 1): Progressing toward goal Week 2:  PT Short Term Goal 1 (Week 2): STG = LTG d/t ELOS  Skilled Therapeutic Interventions/Progress Updates:  Ambulation/gait training;Balance/vestibular training;Cognitive remediation/compensation;Discharge planning;DME/adaptive  equipment instruction;Neuromuscular re-education;Functional mobility training;Therapeutic Activities;UE/LE Strength taining/ROM;UE/LE Coordination activities;Therapeutic Exercise;Stair training;Patient/family education;Visual/perceptual remediation/compensation   Therapy Documentation Precautions:  Precautions Precautions: Fall Recall of Precautions/Restrictions: Impaired Precaution/Restrictions Comments: legally blind Restrictions Weight Bearing Restrictions Per Provider Order: No  Pain:  Does not usually endorse pain during sessions.   Therapy/Group: Individual Therapy  Donne Gage PT, DPT, CSRS 02/26/2024, 5:33 PM

## 2024-02-27 NOTE — Progress Notes (Signed)
 Physical Therapy Session Note  Patient Details  Name: Alison Lopez MRN: [id number] Date of Birth: 1956-09-03  Today's Date: 02/27/2024 PT Individual Time: 9563-8756 PT Individual Time Calculation (min): 55 min   Short Term Goals: Week 1:  PT Short Term Goal 1 (Week 1): STGs = LTGs PT Short Term Goal 1 - Progress (Week 1): Progressing toward goal  Skilled Therapeutic Interventions/Progress Updates:     Pt received supine in bed and agrees to therapy. No complaint of pain. Pt performs supine to sit with cues for positioning. Pt performs sit to stand with CGA and ambulates x150' to gym with minA at trunk for stability, with light cueing for lateral weight shifting and increasing trunk rotation and arm swing to improve balance.   PT explains 6 Minute Walk test and pt is agreeable to complete, but requires extended seated rest break before attempting. Pt then stands and ambulates for 4:13 prior to requesting rest break, completing 350'. Pt requires minA for majority of test, with PT promoting upright posture and forward progression. When PT decreases tactile cueing, pt tends to slow down and nearly stop, despite PT providing verbal cues to continue walking. Pt also has several lateral LOBs, requiring up to modA to prevent fall.   Pt verbalizing low back pain. PT has pt transition to supine for pain relieft and exercise for hip strengthening and to address pain. Pt attempts bridges in hooklying but is unable to accurately reproduce desired movements. Pt completes 3x10 clamshells in hooklying with green theraband resistance and verbal and tactile cues for NM feedback.   Pt ambulates back to room with minA and similar cues. Left supine in bed with alarm intact and all needs within reach.   Therapy Documentation Precautions:  Precautions Precautions: Fall Recall of Precautions/Restrictions: Impaired Precaution/Restrictions Comments: legally blind Restrictions Weight Bearing Restrictions Per  Provider Order: No   Therapy/Group: Individual Therapy  Neva Barban, PT, DPT 02/27/2024, 3:54 PM

## 2024-02-28 LAB — GLUCOSE, CAPILLARY
Glucose-Capillary: 85 mg/dL (ref 70–99)
Glucose-Capillary: 110 mg/dL — ABNORMAL HIGH (ref 70–99)

## 2024-02-28 MED ORDER — CEPHALEXIN 250 MG PO CAPS: 500.0000 mg | ORAL_CAPSULE | Freq: Three times a day (TID) | ORAL | Status: DC

## 2024-02-28 MED ORDER — CEFADROXIL 500 MG PO CAPS
500.0000 mg | ORAL_CAPSULE | Freq: Two times a day (BID) | ORAL | Status: AC
  Administered 2024-02-28 – 2024-03-03 (×10): 500 mg via ORAL
  Filled 2024-02-28 (×10): qty 1

## 2024-02-28 NOTE — Progress Notes (Addendum)
 PROGRESS NOTE   Subjective/Complaints:  Pt reports no issues except mild dysuria.  LBM large evening of 4/17- will give sorbitol tomorrow if no BM by then.,   Denies constipation, but doesn't remember her LBM.   ROS:   Pt denies SOB, abd pain, CP, N/V/- not sure - C/D, and vision changes      Objective:   No results found. Recent Labs    02/26/24 0933  WBC 6.5  HGB 10.5*  HCT 32.7*  PLT 84*    Recent Labs    02/26/24 0933  NA 138  K 4.0  CL 107  CO2 20*  GLUCOSE 130*  BUN 17  CREATININE 1.00  CALCIUM  8.7*     Intake/Output Summary (Last 24 hours) at 02/28/2024 0917 Last data filed at 02/28/2024 0835 Gross per 24 hour  Intake 240 ml  Output --  Net 240 ml        Physical Exam: Vital Signs Blood pressure 136/87, pulse 66, temperature 98.4 F (36.9 C), resp. rate 18, height 4\' 9"  (1.448 m), weight 49.7 kg, SpO2 95%.    General: awake, alert,flat- supine in bed; NAD HENT:  Bilateral blindness, baseline.  Mild anisocoria with left pupil slightly more dilated than right, does become equal with light.poor dentition with multiple broken teeth CV: regular rate; no JVD Pulmonary: CTA B/L; no W/R/R- good air movement GI: soft, NT, ND, (+)BS Psychiatric: flat- interacting but only to direct questions Neurological: alert  Skin: intact; no apparent rashes or lesions.  Neurologic: Awake, alert, oriented to self, place and time with some cues.  Follows all simple commands.  Obvious processing delays and memory deficits.   Cranial nerves: +Mild R facial droop,   No sensory deficits.   Motor 4- out of 5 throughout R upper extremity, 4+/5 RLE; 5-/5 L upper and lower extremities Reflexes intact.  No Hoffmann's, Babinski.   No obvious tone.   No coordination deficits. + Difficulty with attention, processing delays--improving  + Right sided inattention -- ongoing    Assessment/Plan: 1. Functional  deficits which require 3+ hours per day of interdisciplinary therapy in a comprehensive inpatient rehab setting. Physiatrist is providing close team supervision and 24 hour management of active medical problems listed below. Physiatrist and rehab team continue to assess barriers to discharge/monitor patient progress toward functional and medical goals  Care Tool:  Bathing    Body parts bathed by patient: Right arm, Chest, Abdomen, Left upper leg, Right upper leg   Body parts bathed by helper: Right lower leg, Left lower leg, Buttocks, Front perineal area, Left arm, Face     Bathing assist Assist Level: Maximal Assistance - Patient 24 - 49%     Upper Body Dressing/Undressing Upper body dressing   What is the patient wearing?: Dress    Upper body assist Assist Level: Total Assistance - Patient < 25%    Lower Body Dressing/Undressing Lower body dressing      What is the patient wearing?: Incontinence brief     Lower body assist Assist for lower body dressing: Total Assistance - Patient < 25%     Toileting Toileting    Toileting assist Assist for toileting: Maximal  Assistance - Patient 25 - 49%     Transfers Chair/bed transfer  Transfers assist     Chair/bed transfer assist level: Minimal Assistance - Patient > 75%     Locomotion Ambulation   Ambulation assist      Assist level: Moderate Assistance - Patient 50 - 74% Assistive device: No Device Max distance: 350'   Walk 10 feet activity   Assist     Assist level: Moderate Assistance - Patient - 50 - 74% Assistive device: No Device   Walk 50 feet activity   Assist    Assist level: Moderate Assistance - Patient - 50 - 74% Assistive device: No Device    Walk 150 feet activity   Assist Walk 150 feet activity did not occur: Safety/medical concerns  Assist level: Moderate Assistance - Patient - 50 - 74% Assistive device: No Device    Walk 10 feet on uneven surface  activity   Assist      Assist level: Minimal Assistance - Patient > 75% Assistive device: Hand held assist   Wheelchair     Assist Is the patient using a wheelchair?: Yes Type of Wheelchair: Manual    Wheelchair assist level: Dependent - Patient 0% Max wheelchair distance: 200 ft    Wheelchair 50 feet with 2 turns activity    Assist        Assist Level: Dependent - Patient 0%   Wheelchair 150 feet activity     Assist      Assist Level: Dependent - Patient 0%   Blood pressure 136/87, pulse 66, temperature 98.4 F (36.9 C), resp. rate 18, height 4\' 9"  (1.448 m), weight 49.7 kg, SpO2 95%.  Medical Problem List and Plan: 1. 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  Status post bur hole 01/30/2024             - patient may shower             - ELOS/Goals: 10 to 14 days, CGA PT/OT, min assist SLP - 4/25             - Stable to contnue IRF  Con't CIR  - 4/15: Worsening truncal ataxia and awareness compared to prior IRF admission. Max cueing for initiation of any ADL tasks. Global intattetnion R>L. Min A for mobility with very poor sitting balance . Min A transfers and amb 175 ft. Worse dysarthria and apraxia.   2.  Antithrombotics: -DVT/anticoagulation:  Mechanical: Antiembolism stockings, thigh (TED hose) Bilateral lower extremities             -antiplatelet therapy: N/A 3. Pain Management: Tylenol  as needed 4. Mood/Behavior/Sleep: continue Aricept  10 mg nightly, Zoloft  25 mg daily, melatonin 5 mg nightly              -antipsychotic agents: N/A   - 4/15: Add sleep log, add delirium precautions  - 4/16: Sleep log appropriate, 10 hours overnight 4-17: DC sleep log, sleeping appropriately.  5. Neuropsych/cognition: This patient is not capable of making decisions on her own behalf.   - 4/15: Ritalin  5 mg BID for poor attention and lethargy--seems to have done a little better with this  6. Skin/Wound Care: Routine skin  checks 7. Fluids/Electrolytes/Nutrition/dysphagia: Routine in and outs with follow-up chemistries   - 4/14: Creatinine mildly elevated, BUN stable, p.o. fluid intakes are excellent. Repeat in 2 days to trend--improved 4/16   - on Dys 3 diet with full supervision  4/18: Labs stable  8.  Antiphospholipid syndrome/history of DVT/thrombocytopenia secondary to antiphospholipid syndrome.  Followed by hematology services.  Chronic ELIQUIS  REMAINS ON HOLD FOR AT LEAST TWO WEEKS GIVEN SDH WITH FOLLOW UP CT HEAD (4/25).   - 4/14: Thrombocytopenia improving.  9.  Hypothyroidism. continue Synthroid   10.  Hyperlipidemia.  continue Crestor   11.  Diabetes mellitus retinopathy/legally blind.  Latest hemoglobin A1c 4.8.  SSI   - BG well controlled, 4/20- BG's well controlled- con't regimen  Recent Labs    02/27/24 1632 02/27/24 2105 02/28/24 0640  GLUCAP 120* 122* 85      12. Bradycardia: continue to monitor HR TID-not recurred  - Heart rate and vital stable  13.  Anemia.  Iron  supplement added 4/12   -4/14: HgB stable 9-10  14.  Urinary incontinence/frequency overnight/dysuria.  Start low-dose Ditropan  2.5 mg nightly.  - 4-17: Monitor 1 to 2 days; would not further dose adjust due to cognitive risk factors.  If no benefit, would DC Ditropan   4/18: Reports of foul urinary output and patient now complaining of intermittent dysuria. DC ditropan . Afebrile, WBC stable, awaiting urinalysis with micro reflex. PVRs ordered.   4/20- will order Keflex  500 mg q8 hours for probable UTI- they didn't do Cx, however- will order one to occur before Keflex  started- U/A showed moderate leuks and many bacteria. Can get Clean catch, but also ordered can get by cath if need be.   -addendum- changed to Cefadroxil  per pharmacy 500 mg BID  15. Throat pain   - No cough or s/s infection   - Very poor dentition, monitor closely for s/s infection   - Add viscous lidocaine  Q6H PRN   I spent a total of 37   minutes  on total care today- >50% coordination of care- due to  D/w nursing about UTI- to get U Cx before starts Keflex .  Also review of labs, micro etc.   LOS: 9 days A FACE TO FACE EVALUATION WAS PERFORMED  Alison Lopez 02/28/2024, 9:17 AM

## 2024-02-29 ENCOUNTER — Inpatient Hospital Stay: Admitting: Oncology

## 2024-02-29 ENCOUNTER — Inpatient Hospital Stay

## 2024-02-29 DIAGNOSIS — A499 Bacterial infection, unspecified: Secondary | ICD-10-CM

## 2024-02-29 DIAGNOSIS — R5383 Other fatigue: Secondary | ICD-10-CM

## 2024-02-29 DIAGNOSIS — N390 Urinary tract infection, site not specified: Secondary | ICD-10-CM

## 2024-02-29 DIAGNOSIS — D6861 Antiphospholipid syndrome: Secondary | ICD-10-CM

## 2024-02-29 LAB — GLUCOSE, CAPILLARY
Glucose-Capillary: 86 mg/dL (ref 70–99)
Glucose-Capillary: 108 mg/dL — ABNORMAL HIGH (ref 70–99)
Glucose-Capillary: 108 mg/dL — ABNORMAL HIGH (ref 70–99)

## 2024-02-29 NOTE — Progress Notes (Signed)
 Physical Therapy Session Note  Patient Details  Name: Alison Lopez MRN: [id number] Date of Birth: February 21, 1956  Today's Date: 02/29/2024 PT Individual Time: 4098-1191 PT Individual Time Calculation (min): 41 min   Short Term Goals: Week 2:  PT Short Term Goal 1 (Week 2): STG = LTG d/t ELOS  Skilled Therapeutic Interventions/Progress Updates:     Pt received supine in bed and initially attempts to refuse therapy due to fatigue, but is agreeable with gentle encouragement. No complaint of pain. Supine to sit requires minA with cues for logrolling and positioning. Pt stands with minA due to posterior and rt lateral bias, and PT provides modA to pull up pants. Pt performs stand step transfer to [facility] with minA and cues for hand placement. WC transport to gym. Pt requires extended seated rest breaks throughout session due to fatigue. Pt completes 2x175' ambulation without AD, requiring minA consistently due to posterior and Rt lateral bias. PT also provides verbal and tactile cues for increasing gait speed and decreasing stance width for improved balance and use of momentum for safety. WC transport back to room. Stand step to bed with minA and cues for positioning. Pt left supine with alarm intact and all needs within reach, with pt continuing to verbalize fatigue and general malaise.   Therapy Documentation Precautions:  Precautions Precautions: Fall Recall of Precautions/Restrictions: Impaired Precaution/Restrictions Comments: legally blind Restrictions Weight Bearing Restrictions Per Provider Order: No    Therapy/Group: Individual Therapy  Neva Barban, PT, DPT 02/29/2024, 4:32 PM

## 2024-02-29 NOTE — Progress Notes (Signed)
 Speech Language Pathology Weekly Progress and Session Note  Patient Details  Name: Alison Lopez MRN: [id number] Date of Birth: [birth year]  Beginning of progress report period: February 20, 2024 End of progress report period: February 29, 2024  Today's Date: 02/29/2024 SLP Individual Time: 1330-1430 SLP Individual Time Calculation (min): 60 min  Short Term Goals: Week 1: SLP Short Term Goal 1 (Week 1): Pt will complete functional naming tasks w/ modA SLP Short Term Goal 1 - Progress (Week 1): Met SLP Short Term Goal 2 (Week 1): Pt will utilize compensatory speech strategies at sentence level w/ maxA SLP Short Term Goal 2 - Progress (Week 1): Met SLP Short Term Goal 3 (Week 1): Pt will follow one step commands during functional cognitive tasks w/ modA SLP Short Term Goal 3 - Progress (Week 1): Met SLP Short Term Goal 4 (Week 1): pt will solve problems during functional cognitive tasks w/ maxA SLP Short Term Goal 4 - Progress (Week 1): Met    New Short Term Goals: Week 2: SLP Short Term Goal 1 (Week 2): STGs = LTGs d/t ELOS  Weekly Progress Updates: Pt demonstrates slight progress towards ST goals overall. Progress is slow, as anticipated, given severity of deficits and baseline cognitive function. Moderate to severe deficits remain overall, but she demonstrates slightly improved speech intelligibility and functional word finding/problem solving. Apraxia continues to negatively impact her ability to consistently follow specific directions, though simple comprehension is improving slightly. Limited to no carryover of information remains, though pt/family education ongoing at this time. She would benefit from continued ST services to target cognitive-linguistic deficits, maximize pt independence, and reduce caregiver burden.     Intensity: Minumum of 1-2 x/day, 30 to 90 minutes Frequency: 3 to 5 out of 7 days Duration/Length of Stay: 4/25 Treatment/Interventions: Cognitive  remediation/compensation;Internal/external aids;Speech/Language facilitation;Therapeutic Exercise;Cueing hierarchy;Functional tasks;Patient/family education;Therapeutic Activities   Daily Session  Skilled Therapeutic Interventions: Pt greeted at bedside. She was asleep upon SLP arrival, but woke easily w/ verbal greeting. She was pleasant and cooperative throughout tx tasks targeting speech production, language, and cognition. She benefited from modA to follow one step commands to assist w/ drinking via straw and re positioning. Spontaneously, she was unable to provide correct orientation information. However, given 2 choices, she verbalized orientation information w/ 90% accuracy. Pt's daughter and grandson arrived for portions of her tx tasks and SLP provided education re language vs cog errors, overall prognosis, and POC. They verbalized understanding of education, but would benefit from additional education in upcoming family training. In conversation w/ SLP and family, she was able to maintain 90% intelligibility w/ only minA cues. Additionally, she benefited from modA to name relevant household items during responsive naming task. At the end of tx tasks, she was left in bed with the alarm set and call light within reach. Recommend cont ST per POC.      Pain  No pain reported  Therapy/Group: Individual Therapy  [name] 02/29/2024, 2:17 PM

## 2024-02-29 NOTE — Progress Notes (Signed)
 Occupational Therapy Session Note  Patient Details  Name: Alison Lopez MRN: [id number] Date of Birth: 1956-05-24  Today's Date: 02/29/2024 OT Individual Time: [phone] and [id number] OT Individual Time Calculation (min): 43 min and 42 min   Short Term Goals: Week 1:  OT Short Term Goal 1 (Week 1): Pt will don shirt with mod A OT Short Term Goal 1 - Progress (Week 1): Not met OT Short Term Goal 2 (Week 1): Pt will don pants with mod A OT Short Term Goal 2 - Progress (Week 1): Not met OT Short Term Goal 3 (Week 1): Pt will complete stand pivot to the toilet with min A OT Short Term Goal 3 - Progress (Week 1): Met OT Short Term Goal 4 (Week 1): Pt will attend to right side of body in ADL task with mod cues OT Short Term Goal 4 - Progress (Week 1): Not met Week 2:  OT Short Term Goal 1 (Week 2): STG= LTG d/t ELOS   Skilled Therapeutic Interventions/Progress Updates:    Session 1: Pt bed level at time of session, reports no pain. Extended time throughout session for following directions and processing. Supine > sit SBA extended time with HOB slightly elevated, walked HHA bed > bathroom > chair with HHA and MIN A. Unable to void on BSC over toilet, removed and was visably more comfortable on regular toilet. Pt with incontinent episode in brief for urine - BM on toilet and needing MAX/total for pericare/hygiene but pt did attempt with visual and verbal cues, needed assist for thoroughness and to fully pull up brief/pants. Standing at sink for washing face but fatigued and seated for oral hygiene - needing MOD A for thoroughly cleaning teeth and cues for sequencing. HHA back to bed, alarm on call bell in reach.   Session 2: Pt bed level at time of session sleeping but woken with tactile stimuli. No pain. Declined toileting. Supine > sit SBA, short distance mobility in room bed <> wheelchair with HHA. Transported to/from ortho gym and focused on dynamic standing balance with pt dancing, standing  with MIN/CGA at times. Attempted seated two handed task but vision limiting ability to see at table top - switched to familiar task of folding washclothes but difficulty sequencing. Pt bed level at end of session alarm on call bell in reach.   Therapy Documentation Precautions:  Precautions Precautions: Fall Recall of Precautions/Restrictions: Impaired Precaution/Restrictions Comments: legally blind Restrictions Weight Bearing Restrictions Per Provider Order: No     Therapy/Group: Individual Therapy  Doroteo Gasmen 02/29/2024, 7:24 AM

## 2024-02-29 NOTE — Progress Notes (Signed)
 PROGRESS NOTE   Subjective/Complaints:  Pt up early with PT. Resting in bed when I finally caught up to her. Denies pain, headache. Moved her bowels this morning. Recalled her dc date!  ROS: Limited due to cognitive/behavioral       Objective:   No results found. No results for input(s): "WBC", "HGB", "HCT", "PLT" in the last 72 hours.   No results for input(s): "NA", "K", "CL", "CO2", "GLUCOSE", "BUN", "CREATININE", "CALCIUM " in the last 72 hours.    Intake/Output Summary (Last 24 hours) at 02/29/2024 1028 Last data filed at 02/29/2024 9604 Gross per 24 hour  Intake 687 ml  Output --  Net 687 ml        Physical Exam: Vital Signs Blood pressure 135/70, pulse 70, temperature 99 F (37.2 C), temperature source Oral, resp. rate 17, height 4\' 9"  (1.448 m), weight 49.7 kg, SpO2 97%.    Constitutional: No distress . Vital signs reviewed. HEENT: NCAT, EOMI, oral membranes moist Neck: supple Cardiovascular: RRR without murmur. No JVD    Respiratory/Chest: CTA Bilaterally without wheezes or rales. Normal effort    GI/Abdomen: BS +, non-tender, non-distended Ext: no clubbing, cyanosis, or edema Psych: pleasant and cooperative   Skin: intact; no apparent rashes or lesions.  Neurologic: Awake, alert, oriented to self, place and time with some cues.  Follows all simple commands. Continued delays in processing Cranial nerves: +right central VII.,   No sensory deficits.   Motor 4- out of 5 throughout R upper extremity, 4+/5 RLE; 5-/5 L upper and lower extremities Reflexes intact.  No Hoffmann's, Babinski.   No obvious tone.   No coordination deficits. + Difficulty with attention, processing delays--improving  + Right sided inattention -- remains    Assessment/Plan: 1. Functional deficits which require 3+ hours per day of interdisciplinary therapy in a comprehensive inpatient rehab setting. Physiatrist is providing  close team supervision and 24 hour management of active medical problems listed below. Physiatrist and rehab team continue to assess barriers to discharge/monitor patient progress toward functional and medical goals  Care Tool:  Bathing    Body parts bathed by patient: Right arm, Chest, Abdomen, Left upper leg, Right upper leg   Body parts bathed by helper: Right lower leg, Left lower leg, Buttocks, Front perineal area, Left arm, Face     Bathing assist Assist Level: Maximal Assistance - Patient 24 - 49%     Upper Body Dressing/Undressing Upper body dressing   What is the patient wearing?: Dress    Upper body assist Assist Level: Total Assistance - Patient < 25%    Lower Body Dressing/Undressing Lower body dressing      What is the patient wearing?: Incontinence brief     Lower body assist Assist for lower body dressing: Total Assistance - Patient < 25%     Toileting Toileting    Toileting assist Assist for toileting: Maximal Assistance - Patient 25 - 49%     Transfers Chair/bed transfer  Transfers assist     Chair/bed transfer assist level: Minimal Assistance - Patient > 75%     Locomotion Ambulation   Ambulation assist      Assist level: Moderate Assistance -  Patient 50 - 74% Assistive device: No Device Max distance: 350'   Walk 10 feet activity   Assist     Assist level: Moderate Assistance - Patient - 50 - 74% Assistive device: No Device   Walk 50 feet activity   Assist    Assist level: Moderate Assistance - Patient - 50 - 74% Assistive device: No Device    Walk 150 feet activity   Assist Walk 150 feet activity did not occur: Safety/medical concerns  Assist level: Moderate Assistance - Patient - 50 - 74% Assistive device: No Device    Walk 10 feet on uneven surface  activity   Assist     Assist level: Minimal Assistance - Patient > 75% Assistive device: Hand held assist   Wheelchair     Assist Is the patient using  a wheelchair?: Yes Type of Wheelchair: Manual    Wheelchair assist level: Dependent - Patient 0% Max wheelchair distance: 200 ft    Wheelchair 50 feet with 2 turns activity    Assist        Assist Level: Dependent - Patient 0%   Wheelchair 150 feet activity     Assist      Assist Level: Dependent - Patient 0%   Blood pressure 135/70, pulse 70, temperature 99 F (37.2 C), temperature source Oral, resp. rate 17, height 4\' 9"  (1.448 m), weight 49.7 kg, SpO2 97%.  Medical Problem List and Plan: 1. 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  Status post bur hole 01/30/2024             - patient may shower             - ELOS/Goals: 10 to 14 days, CGA PT/OT, min assist SLP - 4/25             -Continue CIR therapies including PT, OT, and SLP     2.  Antithrombotics: -DVT/anticoagulation:  Mechanical: Antiembolism stockings, thigh (TED hose) Bilateral lower extremities             -antiplatelet therapy: N/A 3. Pain Management: Tylenol  as needed 4. Mood/Behavior/Sleep: continue Aricept  10 mg nightly, Zoloft  25 mg daily, melatonin 5 mg nightly              -antipsychotic agents: N/A   - sleeping well  5. Neuropsych/cognition: This patient is not capable of making decisions on her own behalf.   - 4/1 continues on Ritalin  5 mg BID for poor attention and lethargy--seems to be helping.   6. Skin/Wound Care: Routine skin checks 7. Fluids/Electrolytes/Nutrition/dysphagia: Routine in and outs with follow-up chemistries   - 4/14: Creatinine mildly elevated, BUN stable, p.o. fluid intakes are excellent. Repeat in 2 days to trend--improved 4/16   - on Dys 3 diet with full supervision  4/21 most recent labs stable.   8.  Antiphospholipid syndrome/history of DVT/thrombocytopenia secondary to antiphospholipid syndrome.  Followed by hematology services.  Chronic ELIQUIS  REMAINS ON HOLD FOR AT LEAST TWO WEEKS GIVEN SDH WITH FOLLOW  UP CT HEAD (4/25).   - 4/14: Thrombocytopenia improving.   -4/21 consider re-checking CT prior to discharge--c/w NS 9.  Hypothyroidism. continue Synthroid   10.  Hyperlipidemia.  continue Crestor   11.  Diabetes mellitus retinopathy/legally blind.  Latest hemoglobin A1c 4.8.  SSI   - BG well controlled, 4/21- BG's well controlled- con't regimen  Recent Labs    02/28/24 0640 02/28/24 2048 02/29/24 0613  GLUCAP 85 110* 86  12. Bradycardia: continue to monitor HR TID-not recurred  - Heart rate and vital stable  13.  Anemia.  Iron  supplement added 4/12   -4/14: HgB stable 9-10  14.  Urinary incontinence/frequency overnight/dysuria.  Start low-dose Ditropan  2.5 mg nightly.  - 4-17: Monitor 1 to 2 days; would not further dose adjust due to cognitive risk factors.  If no benefit, would DC Ditropan   4/18: Reports of foul urinary output and patient now complaining of intermittent dysuria. DC ditropan . Afebrile, WBC stable, awaiting urinalysis with micro reflex. PVRs ordered.   4/20- will order Keflex  500 mg q8 hours for probable UTI- they didn't do Cx, however- will order one to occur before Keflex  started- U/A showed moderate leuks and many bacteria. Can get Clean catch, but also ordered can get by cath if need be.   -addendum- changed to Cefadroxil  per pharmacy 500 mg BID  4/21-- UCX with 100 GNR--continue duricef for now 15. Throat pain   - No cough or s/s infection   - Very poor dentition, monitor closely for s/s infection   - Add viscous lidocaine  Q6H PRN  -improved     LOS: 10 days A FACE TO FACE EVALUATION WAS PERFORMED  Rawland Caddy 02/29/2024, 10:28 AM

## 2024-03-01 ENCOUNTER — Inpatient Hospital Stay (HOSPITAL_COMMUNITY)

## 2024-03-01 LAB — URINE CULTURE: Culture: >=100000 — AB

## 2024-03-01 MED ORDER — METHYLPHENIDATE HCL 5 MG PO TABS
10.0000 mg | ORAL_TABLET | Freq: Two times a day (BID) | ORAL | Status: DC
  Administered 2024-03-01 – 2024-03-04 (×7): 10 mg via ORAL
  Filled 2024-03-01 (×7): qty 2

## 2024-03-01 NOTE — Progress Notes (Addendum)
 PROGRESS NOTE   Subjective/Complaints:  "I slept like a baby!"  Denies pain. Working on breakfast with NT-only really ate pancakes.   ROS: Patient denies fever, rash, sore throat,  dizziness, nausea, vomiting, diarrhea, cough, shortness of breath or chest pain, joint or back/neck pain, headache, or mood change.       Objective:   No results found. No results for input(s): "WBC", "HGB", "HCT", "PLT" in the last 72 hours.   No results for input(s): "NA", "K", "CL", "CO2", "GLUCOSE", "BUN", "CREATININE", "CALCIUM " in the last 72 hours.    Intake/Output Summary (Last 24 hours) at 03/01/2024 0942 Last data filed at 03/01/2024 0900 Gross per 24 hour  Intake 400 ml  Output --  Net 400 ml        Physical Exam: Vital Signs Blood pressure 125/62, pulse 71, temperature 98 F (36.7 C), resp. rate 16, height 4\' 9"  (1.448 m), weight 49.7 kg, SpO2 95%.    Constitutional: No distress . Vital signs reviewed. HEENT: NCAT, EOMI, oral membranes moist Neck: supple Cardiovascular: RRR without murmur. No JVD    Respiratory/Chest: CTA Bilaterally without wheezes or rales. Normal effort    GI/Abdomen: BS +, non-tender, non-distended Ext: no clubbing, cyanosis, or edema Psych: pleasant and cooperative  Skin: intact; no apparent rashes or lesions.  Neurologic: Awake, alert, oriented to self, place and time with some cues.  Follows all simple commands. Continued delays in processing but more on point Cranial nerves: +right central VII.,   No sensory deficits.   Motor 4- out of 5 throughout R upper extremity, 4+/5 RLE; 4+ to 5-/5 L upper and lower extremities Reflexes intact.  .   No resting tone.   No coordination deficits. + Right sided inattention -- persistent    Assessment/Plan: 1. Functional deficits which require 3+ hours per day of interdisciplinary therapy in a comprehensive inpatient rehab setting. Physiatrist is  providing close team supervision and 24 hour management of active medical problems listed below. Physiatrist and rehab team continue to assess barriers to discharge/monitor patient progress toward functional and medical goals  Care Tool:  Bathing    Body parts bathed by patient: Right arm, Chest, Abdomen, Left upper leg, Right upper leg   Body parts bathed by helper: Right lower leg, Left lower leg, Buttocks, Front perineal area, Left arm, Face     Bathing assist Assist Level: Maximal Assistance - Patient 24 - 49%     Upper Body Dressing/Undressing Upper body dressing   What is the patient wearing?: Dress    Upper body assist Assist Level: Total Assistance - Patient < 25%    Lower Body Dressing/Undressing Lower body dressing      What is the patient wearing?: Incontinence brief     Lower body assist Assist for lower body dressing: Total Assistance - Patient < 25%     Toileting Toileting    Toileting assist Assist for toileting: Maximal Assistance - Patient 25 - 49%     Transfers Chair/bed transfer  Transfers assist     Chair/bed transfer assist level: Minimal Assistance - Patient > 75%     Locomotion Ambulation   Ambulation assist  Assist level: Minimal Assistance - Patient > 75% Assistive device: No Device Max distance: 175'   Walk 10 feet activity   Assist     Assist level: Minimal Assistance - Patient > 75% Assistive device: No Device   Walk 50 feet activity   Assist    Assist level: Minimal Assistance - Patient > 75% Assistive device: No Device    Walk 150 feet activity   Assist Walk 150 feet activity did not occur: Safety/medical concerns  Assist level: Minimal Assistance - Patient > 75% Assistive device: No Device    Walk 10 feet on uneven surface  activity   Assist     Assist level: Minimal Assistance - Patient > 75% Assistive device: Hand held assist   Wheelchair     Assist Is the patient using a  wheelchair?: Yes Type of Wheelchair: Manual    Wheelchair assist level: Dependent - Patient 0% Max wheelchair distance: 200 ft    Wheelchair 50 feet with 2 turns activity    Assist        Assist Level: Dependent - Patient 0%   Wheelchair 150 feet activity     Assist      Assist Level: Dependent - Patient 0%   Blood pressure 125/62, pulse 71, temperature 98 F (36.7 C), resp. rate 16, height 4\' 9"  (1.448 m), weight 49.7 kg, SpO2 95%.  Medical Problem List and Plan: 1. 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  Status post bur hole 01/30/2024             - patient may shower             - ELOS/Goals: 10 to 14 days, CGA PT/OT, min assist SLP - 4/25             -Continue CIR therapies including PT, OT, and SLP. Interdisciplinary team conference today to discuss goals, barriers to discharge, and dc planning.       2.  Antithrombotics: -DVT/anticoagulation:  Mechanical: Antiembolism stockings, thigh (TED hose) Bilateral lower extremities             -antiplatelet therapy: N/A 3. Pain Management: Tylenol  as needed 4. Mood/Behavior/Sleep: continue Aricept  10 mg nightly, Zoloft  25 mg daily, melatonin 5 mg nightly              -antipsychotic agents: N/A   - sleeping well  5. Neuropsych/cognition: This patient is not capable of making decisions on her own behalf.   - 4/22 continues on Ritalin  5 mg BID for poor attention and lethargy--seems to be helping. Therapy reports poor initiation and processing with functional tasks--will try 10mg  dose.   6. Skin/Wound Care: Routine skin checks 7. Fluids/Electrolytes/Nutrition/dysphagia: Routine in and outs with follow-up chemistries   - 4/14: Creatinine mildly elevated, BUN stable, p.o. fluid intakes are excellent. Repeat in 2 days to trend--improved 4/16   - on Dys 3 diet with full supervision  4/21 most recent labs stable. Needs some cueing to eat at times  8.   Antiphospholipid syndrome/history of DVT/thrombocytopenia secondary to antiphospholipid syndrome.  Followed by hematology services.  Chronic ELIQUIS  REMAINS ON HOLD FOR AT LEAST TWO WEEKS GIVEN SDH WITH FOLLOW UP CT HEAD (4/25).   - 4/14: Thrombocytopenia improving.   -4/22 consider re-checking CT prior to discharge vs as outpt--c/w NS 9.  Hypothyroidism. continue Synthroid   10.  Hyperlipidemia.  continue Crestor   11.  Diabetes mellitus retinopathy/legally blind.  Latest hemoglobin A1c 4.8.  SSI   -  4/22 cbg's well controlled Recent Labs    02/29/24 0613 02/29/24 1147 02/29/24 1656  GLUCAP 86 108* 108*      12. Bradycardia: continue to monitor HR TID-not recurred  - Heart rate and vital stable  13.  Anemia.  Iron  supplement added 4/12   -4/14: HgB stable 9-10  14.  Urinary incontinence/frequency overnight/dysuria.  Start low-dose Ditropan  2.5 mg nightly.  - 4-17: Monitor 1 to 2 days; would not further dose adjust due to cognitive risk factors.  If no benefit, would DC Ditropan   4/18: Reports of foul urinary output and patient now complaining of intermittent dysuria. DC ditropan . Afebrile, WBC stable, awaiting urinalysis with micro reflex. PVRs ordered.   4/20- will order Keflex  500 mg q8 hours for probable UTI- they didn't do Cx, however- will order one to occur before Keflex  started- U/A showed moderate leuks and many bacteria. Can get Clean catch, but also ordered can get by cath if need be.   -addendum- changed to Cefadroxil  per pharmacy 500 mg BID  4/22-- UCX with 100 E Coli--should be sens to  duricef--thru 4/25 15. Throat pain   - No cough or s/s infection   - Very poor dentition, monitor closely for s/s infection   - Add viscous lidocaine  Q6H PRN  -appears improved     LOS: 11 days A FACE TO FACE EVALUATION WAS PERFORMED  Rawland Caddy 03/01/2024, 9:42 AM

## 2024-03-01 NOTE — Progress Notes (Signed)
 Speech Language Pathology Daily Session Note  Patient Details  Name: Alison Lopez MRN: [id number] Date of Birth: [birth year]  Today's Date: 03/01/2024 SLP Individual Time: 0103-0132 SLP Individual Time Calculation (min): 29 min  Short Term Goals: Week 2: SLP Short Term Goal 1 (Week 2): STGs = LTGs d/t ELOS  Skilled Therapeutic Interventions:  Patient was seen in PM for family education session. Pt was alert and seen at bedside. Pt's dtr, Amber present and an active participant in session. SLP reviewed purpose and rationale for speech therapy including; dysphagia, cognitive therapy, aphasia, apraxia, and dysarthria. Education provided on current diet recommendations of Dys 3 solids and thin liquids, s/s aspiration, and safe swallowing strategies. SLP also educated pt and family on strategies to maximize language, speech, and cognition. SLP reviewed energy conservation methods, compensatory language and cognitive strategies, as well as ways to support language and cognition in the home. All information was supplemented with handouts. Pt's dtr, Amber was an active participant in the session as noted by adding insight as approp and asking thoughtful questions. Direct handoff to CSW at conclusion of session. SLP to continue POC.   Pain Pain Assessment Pain Scale: 0-10 Pain Score: 0-No pain  Therapy/Group: Individual Therapy  Adela Holter 03/01/2024, 1:53 PM

## 2024-03-01 NOTE — Progress Notes (Signed)
 Patient ID: Alison Lopez, female   DOB: 25-Oct-1956, 68 y.o.   MRN: [id number]  SW met with pt and pt dtr in room to review updates from team conference, d/c dater remains 4/24. She reports that her son is ocming in to help assit while she is at work. Preferred PCS agencies: 1) [facility] 2), [employer], 3) [employer].   SW ordered TTB and 3in1 BSC with [employer] via parachute.   PCS form faxed t [email or web address] ([phone]/[phone]).  Norval Been, MSW, LCSW Office: [phone] Cell: [phone] Fax: [phone]

## 2024-03-01 NOTE — Progress Notes (Signed)
 Occupational Therapy Session Note  Patient Details  Name: Alison Lopez MRN: [id number] Date of Birth: 1955/12/04  Today's Date: 03/01/2024 OT Individual Time: 1330-1430 OT Individual Time Calculation (min): 60 min    Short Term Goals: Week 2:  OT Short Term Goal 1 (Week 2): STG= LTG d/t ELOS  Skilled Therapeutic Interventions/Progress Updates:    Family education session completed with pt and her daughter Hospital doctor. Verbal education provided re fall risk reduction, energy conservation strategies, home carryover of transfer training, ADLs, and IADLs. Demonstration and hands on training completed for pt performance of UB/LB bathing and dressing at mod-max A level, toileting hygiene and transfers, and shower transfers. Provided education and demonstration on DME use recommendations at home. Amber provided hands on assistance with ADLs, toileting, and transfers. Cueing provided to increase caregiver safety and body mechanics. She completed 150 ft of functional mobility with min A to the ADL apt where she returned demo of TTB use with min A. She returned to her room and was left supine with all needs met. Daughter present.    Therapy Documentation Precautions:  Precautions Precautions: Fall Recall of Precautions/Restrictions: Impaired Precaution/Restrictions Comments: legally blind Restrictions Weight Bearing Restrictions Per Provider Order: No  Therapy/Group: Individual Therapy  [name] 03/01/2024, 6:33 AM

## 2024-03-01 NOTE — Progress Notes (Signed)
 Physical Therapy Session Note  Patient Details  Name: Alison Lopez MRN: [id number] Date of Birth: [birth year]  Today's Date: 03/01/2024 PT Individual Time: 7829-5621 PT Individual Time Calculation (min): 60 min  and Today's Date: 03/01/2024 PT Missed Time: 15 Minutes Missed Time Reason: Patient fatigue  Today's Date: 03/01/2024 PT Individual Time: 3086-5784 PT Individual Time Calculation (min): 57 min   Short Term Goals: Week 2:  PT Short Term Goal 1 (Week 2): STG = LTG d/t ELOS  Skilled Therapeutic Interventions/Progress Updates:     1st Session: Pt received supine in bed and agrees to therapy. NO complaint of pain and reports "feeling better than yesterday". Pt performs supine to sit with minA and cues for hand placement, use of bed features, and positioning. Pt performs stand step transfer from bed to [facility] with minA and cues for posture and positioning, and increasing eccentric control of stand to sit for safety and strengthening. WC transport to gym. Pt requests rest break prior to additional mobility. Pt stands and ambulates x175' with minA/modA, demonstrating consistent posterior and Rt sided bias, with difficulty correcting despite consistent verbal and tactile cueing. PT suggests trial of RW and pt is agreeable. Pt then ambulates x175' with RW and requires minA for management of RW as well as continued posterior/right bias, and pt also frequently kicks RW with RLE and LLE during swing phase, negatively impacting safety. Pt then trials ambulation with Lt hand held assistance. Pt ambulates x175' and appears more confident and steady than with RW or with solely external support at trunk, though continues to require minA and with previously mentioned trunk extension and slight Rt sided lean.   PT encourages pt to sit up in [facility] following session to work on upright tolerance and endurance. Pt agreeable. Left seated with alarm intact and all needs within reach.   2nd Session: Pt received  supine in bed and agrees to therapy. No complaint of pain but does verbalize fatigue and wanting to rest. PT provides gentle encouragement to participate, as daughter is present for family education, and pt is agreeable. Pt performs supine to sit with daughter assisting. Stand step to [facility] with daughter guarding pt. WC transport to gym. Pt complete WC>car transfer with daughter assisting and PT providing cues for safety. During rest break, PT educates on pt's progress with therapy, current mobility status, and recommendations for safe mobility following discharge. Pt then completes x4 6" steps with Rt handrail. Pt requires minA for ascending steps, with cues for positioning and foot placement, but requires modA for descending steps, with pt demonstrating significant apprehension. Following rest break, pt completes additional x4 steps with daughter assisting and PT cueing for safe placement of caregiver as well as cues to pt for hand placement.   Pt ambulates x175' with Rt HHA and with improved stability and gait pattern relative to previous sessions, requiring light minA primarily to direct pt due to visual impairments. Pt then completes Nustep for endurance training and strengthening. Pt completes total of 800 with multiple required rest breaks, at worklaod of 6 with average steps per minute ~55 during active movement. Pt responds very well to music and appears more engaged in treatment.   Pt ambulates back to room with same assistance and cues, working on overall endurance. Left supine in bed with alarm intact and all needs within reach.   Therapy Documentation Precautions:  Precautions Precautions: Fall Recall of Precautions/Restrictions: Impaired Precaution/Restrictions Comments: legally blind Restrictions Weight Bearing Restrictions Per Provider Order: No  Therapy/Group: Individual Therapy  Neva Barban, PT, DPT 03/01/2024, 3:48 PM

## 2024-03-01 NOTE — Plan of Care (Signed)
  Problem: RH BOWEL ELIMINATION Goal: RH STG MANAGE BOWEL WITH ASSISTANCE Description: STG Manage Bowel with minimal  Assistance. Outcome: Progressing   Problem: RH BLADDER ELIMINATION Goal: RH STG MANAGE BLADDER WITH ASSISTANCE Description: STG Manage Bladder With minimal  Assistance Outcome: Progressing   Problem: RH SKIN INTEGRITY Goal: RH STG SKIN FREE OF INFECTION/BREAKDOWN Description: Manage skin free of infection/breakdown with minimal assistance from daughter Outcome: Progressing   Problem: RH SAFETY Goal: RH STG ADHERE TO SAFETY PRECAUTIONS W/ASSISTANCE/DEVICE Description: STG Adhere to Safety Precautions With minimal Assistance/Device. Outcome: Progressing   Problem: RH PAIN MANAGEMENT Goal: RH STG PAIN MANAGED AT OR BELOW PT'S PAIN GOAL Outcome: Progressing

## 2024-03-02 LAB — GLUCOSE, CAPILLARY
Glucose-Capillary: 93 mg/dL (ref 70–99)
Glucose-Capillary: 95 mg/dL (ref 70–99)
Glucose-Capillary: 95 mg/dL (ref 70–99)
Glucose-Capillary: 153 mg/dL — ABNORMAL HIGH (ref 70–99)

## 2024-03-02 MED ORDER — APIXABAN 2.5 MG PO TABS
2.5000 mg | ORAL_TABLET | Freq: Two times a day (BID) | ORAL | Status: DC
Start: 2024-03-02 — End: 2024-03-04
  Administered 2024-03-02 – 2024-03-04 (×5): 2.5 mg via ORAL
  Filled 2024-03-02 (×5): qty 1

## 2024-03-02 NOTE — Progress Notes (Signed)
 Follow-up cranial CT scan reviewed by neurosurgery Dr.Yarbrough and patient has been cleared to resume anticoagulation with Eliquis  2.5 mg twice daily

## 2024-03-02 NOTE — Progress Notes (Signed)
 Speech Language Pathology Daily Session Note  Patient Details  Name: Alison Lopez MRN: [id number] Date of Birth: 09/05/1956  Today's Date: 03/02/2024 SLP Individual Time: 1330-1430 SLP Individual Time Calculation (min): 60 min  Short Term Goals: Week 2: SLP Short Term Goal 1 (Week 2): STGs = LTGs d/t ELOS  Skilled Therapeutic Interventions:   Pt greeted at bedside. She was awake/alert watching TV upon SLP arrival. She was very pleasant/cooperative throughout tx tasks targeting cognition, language, and motor speech production. When provided w/ 2 choices, she was able to ID current orientation information @ 90% accuracy. SLP then facilitated generative naming task ID 3 items in a concrete category. She benefited from modA to name items d/t perseverations, semantic paraphasias, and phonemic paraphasias. At word level, she independently maintained 95% intelligibility w/ context. At sentence and conversation level, she required minA to maintain 90% intelligibility. She also benefited from [facility] for thought formulation and problem solving during verbal task comparing/contrasting 2 common objects. Of note, she was able to sustain attention to the task for ~20 mins. At the end of tx tasks, she was able to recall the current dow and ID remaining time for this admission. She was left in bed with her call light within reach and alarm set. Recommend cont ST.    Pain  No pain reported  Therapy/Group: Individual Therapy  [name] 03/02/2024, 1:04 PM

## 2024-03-02 NOTE — Progress Notes (Signed)
 PROGRESS NOTE   Subjective/Complaints:  No issues overnight. Slept well again. She told me she felt her energy was better.   ROS: Patient denies fever, rash, sore throat, blurred vision, dizziness, nausea, vomiting, diarrhea, cough, shortness of breath or chest pain, joint or back/neck pain, headache, or mood change.       Objective:   CT HEAD WO CONTRAST ( ) Result Date: 03/01/2024 CLINICAL DATA:  Provided history: Subdural hematoma. EXAM: CT HEAD WITHOUT CONTRAST TECHNIQUE: Contiguous axial images were obtained from the base of the skull through the vertex without intravenous contrast. RADIATION DOSE REDUCTION: This exam was performed according to the departmental dose-optimization program which includes automated exposure control, adjustment of the mA and/or kV according to patient size and/or use of iterative reconstruction technique. COMPARISON:  Head CT 02/16/2024. FINDINGS: Brain: Generalized cerebral atrophy. The subdural hematoma demonstrated along the right cerebral convexity on the prior head CT of 02/16/2024 has essentially resolved. The mixed density subdural hematoma along the left cerebral convexity persists, but has decreased in size and now measures up to 7 mm in thickness (previously 9-10 mm). Persistent although decreased hyperdense components within this hematoma. Foci of chronic encephalomalacia/gliosis again demonstrated within the bilateral frontal, parietal and occipital lobes. Background patchy and ill-defined hypoattenuation within the cerebral white matter, nonspecific but compatible chronic small vessel ischemic disease. 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 No acute demarcated cortical infarct. No evidence of an intracranial mass. No midline shift. Vascular: No hyperdense vessel.  Atherosclerotic calcifications. Skull: No acute calvarial fracture or aggressive osseous  lesion. Burr holes within the frontoparietal calvarium bilaterally. Sinuses/Orbits: No mass or acute finding within the imaged orbits. 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 No significant paranasal sinus disease at the imaged levels. IMPRESSION: 1. A mixed density subdural hematoma along the left cerebral convexity persists, but has decreased in size since 02/16/2024, now measuring up to 7 mm in thickness. No midline shift. 2. The mixed density subdural hematoma previously demonstrated along the right cerebral convexity has essentially resolved. 3. Background parenchymal atrophy, chronic small vessel ischemic disease and chronic infarcts, as described. Electronically Signed   By: Bascom Lily D.O.   On: 03/01/2024 13:37   No results for input(s): "WBC", "HGB", "HCT", "PLT" in the last 72 hours.   No results for input(s): "NA", "K", "CL", "CO2", "GLUCOSE", "BUN", "CREATININE", "CALCIUM " in the last 72 hours.    Intake/Output Summary (Last 24 hours) at 03/02/2024 0833 Last data filed at 03/01/2024 1843 Gross per 24 hour  Intake 600 ml  Output --  Net 600 ml        Physical Exam: Vital Signs Blood pressure 129/68, pulse 64, temperature 98.6 F (37 C), temperature source Oral, resp. rate 16, height 4\' 9"  (1.448 m), weight 49.7 kg, SpO2 97%.    Constitutional: No distress . Vital signs reviewed. HEENT: NCAT, EOMI, oral membranes moist Neck: supple Cardiovascular: RRR without murmur. No JVD    Respiratory/Chest: CTA Bilaterally without wheezes or rales. Normal effort    GI/Abdomen: BS +, non-tender, non-distended Ext: no clubbing, cyanosis, or edema Psych: pleasant and cooperative  Skin: intact; no apparent rashes or lesions.  Neurologic: Awake, alert,  oriented to self, place and time with some cues.  Follows all simple commands. Continued delays in processing but more on point. Initiates better at times Cranial nerves: +right central VII. Dysconjugate gaze No sensory deficits.   Motor 4- out  of 5 throughout R upper extremity, 4+/5 RLE; 4+ to 5-/5 L upper and lower extremities Reflexes intact.  .   No resting tone.   No coordination deficits. + Right sided inattention -- still present    Assessment/Plan: 1. Functional deficits which require 3+ hours per day of interdisciplinary therapy in a comprehensive inpatient rehab setting. Physiatrist is providing close team supervision and 24 hour management of active medical problems listed below. Physiatrist and rehab team continue to assess barriers to discharge/monitor patient progress toward functional and medical goals  Care Tool:  Bathing    Body parts bathed by patient: Right arm, Chest, Abdomen, Left upper leg, Right upper leg   Body parts bathed by helper: Right lower leg, Left lower leg, Buttocks, Front perineal area, Left arm, Face     Bathing assist Assist Level: Maximal Assistance - Patient 24 - 49%     Upper Body Dressing/Undressing Upper body dressing   What is the patient wearing?: Dress    Upper body assist Assist Level: Maximal Assistance - Patient 25 - 49%    Lower Body Dressing/Undressing Lower body dressing      What is the patient wearing?: Incontinence brief     Lower body assist Assist for lower body dressing: Total Assistance - Patient < 25%     Toileting Toileting    Toileting assist Assist for toileting: Maximal Assistance - Patient 25 - 49%     Transfers Chair/bed transfer  Transfers assist     Chair/bed transfer assist level: Minimal Assistance - Patient > 75%     Locomotion Ambulation   Ambulation assist      Assist level: Minimal Assistance - Patient > 75% Assistive device: Hand held assist Max distance: 175'   Walk 10 feet activity   Assist     Assist level: Minimal Assistance - Patient > 75% Assistive device: Hand held assist   Walk 50 feet activity   Assist    Assist level: Minimal Assistance - Patient > 75% Assistive device: Hand held assist     Walk 150 feet activity   Assist Walk 150 feet activity did not occur: Safety/medical concerns  Assist level: Minimal Assistance - Patient > 75% Assistive device: Hand held assist    Walk 10 feet on uneven surface  activity   Assist     Assist level: Minimal Assistance - Patient > 75% Assistive device: Hand held assist   Wheelchair     Assist Is the patient using a wheelchair?: Yes Type of Wheelchair: Manual    Wheelchair assist level: Dependent - Patient 0% Max wheelchair distance: 200 ft    Wheelchair 50 feet with 2 turns activity    Assist        Assist Level: Dependent - Patient 0%   Wheelchair 150 feet activity     Assist      Assist Level: Dependent - Patient 0%   Blood pressure 129/68, pulse 64, temperature 98.6 F (37 C), temperature source Oral, resp. rate 16, height 4\' 9"  (1.448 m), weight 49.7 kg, SpO2 97%.  Medical Problem List and Plan: 1. 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  Status post bur hole 01/30/2024             -  patient may shower             - ELOS/Goals: 10 to 14 days, CGA PT/OT, min assist SLP - 4/25             -Continue CIR therapies including PT, OT, and SLP  2.  Antithrombotics: -DVT/anticoagulation:  eliquis  resumed (see below)             -antiplatelet therapy: N/A 3. Pain Management: Tylenol  as needed 4. Mood/Behavior/Sleep: continue Aricept  10 mg nightly, Zoloft  25 mg daily, melatonin 5 mg nightly              -antipsychotic agents: N/A   - sleeping well  5. Neuropsych/cognition: This patient is not capable of making decisions on her own behalf.   - 4/23 ritalin  increased to 10mg  to improve initiation/processing -observe clinically. +/- difference on exam today   6. Skin/Wound Care: Routine skin checks 7. Fluids/Electrolytes/Nutrition/dysphagia: Routine in and outs with follow-up chemistries   - 4/14: Creatinine mildly elevated, BUN stable,  p.o. fluid intakes are excellent. Repeat in 2 days to trend--improved 4/16   - on Dys 3 diet with full supervision  4/21 most recent labs stable. Needs some cueing to eat at times  8.  Antiphospholipid syndrome/history of DVT/thrombocytopenia secondary to antiphospholipid syndrome.  Followed by hematology services.  Chronic ELIQUIS  REMAINS ON HOLD FOR AT LEAST TWO WEEKS GIVEN SDH WITH FOLLOW UP CT HEAD (4/25).   - 4/14: Thrombocytopenia improving.   -4/23 HCT with improvement/evolution of SDH   -NS consulted and gave clearance to resume eliquis  9.  Hypothyroidism. continue Synthroid   10.  Hyperlipidemia.  continue Crestor   11.  Diabetes mellitus retinopathy/legally blind.  Latest hemoglobin A1c 4.8.  SSI   - 4/22-23 cbg's well controlled Recent Labs    02/29/24 1147 02/29/24 1656 03/02/24 0616  GLUCAP 108* 108* 93      12. Bradycardia: continue to monitor HR TID-not recurred  - Heart rate and vital stable  13.  Anemia.  Iron  supplement added 4/12   -4/14: HgB stable 9-10  14.  Urinary incontinence/frequency overnight/dysuria.  Start low-dose Ditropan  2.5 mg nightly.  - 4-17: Monitor 1 to 2 days; would not further dose adjust due to cognitive risk factors.  If no benefit, would DC Ditropan   4/22-- UCX with 100 E Coli--should be sens to  duricef--thru 4/25 15. Throat pain   - resolved     LOS: 12 days A FACE TO FACE EVALUATION WAS PERFORMED  Rawland Caddy 03/02/2024, 8:33 AM

## 2024-03-02 NOTE — Progress Notes (Signed)
 Occupational Therapy Discharge Summary  Patient Details  Name: Alison Lopez MRN: [id number] Date of Birth: 07-06-1956  Date of Discharge from OT service:March 03, 2024   Patient has met 6 of 13 long term goals due to improved activity tolerance, improved balance, and functional use of  RIGHT upper and RIGHT lower extremity.  Patient to discharge at overall Mod Assist level.  Patient's care partner is independent to provide the necessary cognitive assistance at discharge.  Family education has been completed with her daughter Alison Lopez who is comfortable providing the level of physical and cognitive assist Alison Lopez will need. She is able to transfer at a CGA- min A level but required mod-max A for ADLs d/t ongoing severe cognitive deficits that impact motor planning.   Reasons goals not met: Since the extension of her initial SDH, Onya has not recovered as quickly as she was with her first. She did not meet her transfer goals or her dressing goals.  Recommendation:  Patient will benefit from ongoing skilled OT services in outpatient setting to continue to advance functional skills in the area of BADL and iADL.  Equipment: BSC, TTB  Reasons for discharge: treatment goals met and discharge from hospital  Patient/family agrees with progress made and goals achieved: Yes  OT Discharge Precautions/Restrictions  Precautions Precautions: Fall Recall of Precautions/Restrictions: Impaired Precaution/Restrictions Comments: legally blind Restrictions Weight Bearing Restrictions Per Provider Order: No   ADL ADL Eating: Maximal assistance Where Assessed-Eating: Bed level Grooming: Moderate assistance, Maximal cueing Where Assessed-Grooming: Sitting at sink Upper Body Bathing: Moderate assistance, Maximal cueing Where Assessed-Upper Body Bathing: Shower Lower Body Bathing: Moderate assistance, Maximal cueing Where Assessed-Lower Body Bathing: Shower Upper Body Dressing: Maximal  assistance Where Assessed-Upper Body Dressing: Sitting at sink Lower Body Dressing: Maximal assistance, Maximal cueing Where Assessed-Lower Body Dressing: Sitting at sink Toileting: Moderate assistance, Maximal cueing Where Assessed-Toileting: Teacher, adult education: Curator Method: Ambulating Tub/Shower Transfer: Minimal Radiation protection practitioner Method: Ship broker: Insurance underwriter: Insurance underwriter Method: Warden/ranger: Sales promotion account executive Baseline Vision/History: 2 Legally blind Patient Visual Report: Peripheral vision impairment Vision Assessment?: Vision impaired- to be further tested in functional context Perception  Perception: Impaired Praxis Praxis: Impaired Praxis Impairment Details: Motor planning;Ideomotor;Perseveration;Limb apraxia Cognition Cognition Overall Cognitive Status: Impaired/Different from baseline Arousal/Alertness: Awake/alert Orientation Level: Person;Place;Situation Person: Oriented Place: Oriented Situation: Oriented Memory: Impaired Memory Impairment: Decreased recall of new information Attention: Sustained Sustained Attention: Impaired Sustained Attention Impairment: Verbal complex;Verbal basic Awareness: Impaired Awareness Impairment: Intellectual impairment Problem Solving: Impaired Problem Solving Impairment: Verbal basic;Functional basic Executive Function:  (all impaired) Safety/Judgment: Impaired Comments: All grossly impaired 2/2 severe cognitive deficits Sensation Sensation Light Touch: Impaired Detail Light Touch Impaired Details: Impaired RUE;Impaired RLE Proprioception: Impaired Detail Proprioception Impaired Details: Impaired RUE;Impaired RLE Stereognosis: Impaired Detail Stereognosis Impaired Details: Impaired RUE;Impaired LUE Coordination Gross Motor Movements are Fluid and Coordinated:  No Fine Motor Movements are Fluid and Coordinated: No Coordination and Movement Description: R hemiparesis with inattention Finger Nose Finger Test: Severely impaired- combo of visual deficits and R inattention Motor  Motor Motor: Hemiplegia;Abnormal postural alignment and control;Motor apraxia Mobility  Bed Mobility Bed Mobility: Supine to Sit;Sit to Supine;Rolling Right;Rolling Left Rolling Right: Supervision/verbal cueing Rolling Left: Supervision/Verbal cueing Supine to Sit: Supervision/Verbal cueing Sit to Supine: Supervision/Verbal cueing Transfers Sit to Stand: Minimal Assistance - Patient > 75% Stand to Sit: Minimal Assistance - Patient > 75%  Trunk/Postural Assessment  Cervical Assessment  Cervical Assessment: Exceptions to [facility] (forward head) Thoracic Assessment Thoracic Assessment: Exceptions to [facility] (rounded head) Lumbar Assessment Lumbar Assessment: Exceptions to [facility] (posterior pelvic tilt) Postural Control Postural Control: Deficits on evaluation Trunk Control: leans to the right Righting Reactions: delayed Protective Responses: delayed  Balance Balance Balance Assessed: Yes Static Sitting Balance Static Sitting - Balance Support: Bilateral upper extremity supported;Feet supported Static Sitting - Level of Assistance: 5: Stand by assistance Dynamic Sitting Balance Dynamic Sitting - Balance Support: No upper extremity supported;Feet supported Dynamic Sitting - Level of Assistance: 4: Min assist Static Standing Balance Static Standing - Balance Support: During functional activity;Right upper extremity supported Static Standing - Level of Assistance: 4: Min assist Dynamic Standing Balance Dynamic Standing - Balance Support: During functional activity;Right upper extremity supported Dynamic Standing - Level of Assistance: 4: Min assist Extremity/Trunk Assessment RUE Assessment RUE Assessment: Exceptions to [facility] General Strength Comments: R inattention. arthritic  changes with ulnar drift. Strength 3+.5. Apraxia  RUE RUE Body System: Neuro Brunstrum levels for arm and hand: Arm Brunstrum level for arm: Stage V Relative Independence from Synergy Brunstrum level for hand: Stage V Independence from basic synergies LUE Assessment LUE Assessment: Exceptions to [facility] General Strength Comments: arthritic changes with ulnar drift. Strength 3+.5   [name] 03/02/2024, 3:35 PM

## 2024-03-02 NOTE — Patient Care Conference (Signed)
 Inpatient RehabilitationTeam Conference and Plan of Care Update Date: 03/01/2024   Time: 1012 am    Patient Name: Alison Lopez      Medical Record Number: [id number]  Date of Birth: [birth year] Sex: Female         Room/Bed: 4W20C/4W20C-01 Payor Info: Payor: [employer] MEDICARE / Plan: [employer] MEDICARE HMO / Product Type: *No Product type* /    Admit Date/Time:  02/19/2024  3:35 PM  Primary Diagnosis:  SDH (subdural hematoma) [facility])  Hospital Problems: Principal Problem:   SDH (subdural hematoma) [facility])    Expected Discharge Date: Expected Discharge Date: 03/04/24  Team Members Present: Physician leading conference: Dr. Abelino Able Social Worker Present: Norval Been, LCSWA Nurse Present: Jerene Monks, RN PT Present: Theodis Fiscal, PT OT Present: Eilene Grater, OT SLP Present: Jenney Modest, SLP     Current Status/Progress Goal Weekly Team Focus  Bowel/Bladder   Incontinent B/B with some continence episode  LBM 03/01/23   Will regain B/B continence with normal pattern   Assist with time toileting qshift and provide education on bladder program    Swallow/Nutrition/ Hydration   dys 3/thin   supervision  n/a - met her goal of utilizing safe swallow strategies w/ supA, continues to require assistance w/ self feeding though d/t cog/motor planning/visual deficits    ADL's   Mod-max A ADLs 2/2 apraxia. Min A mobility d/t truncal ataxia. R hemi with inattention. Family edu today to determine ability to go home with daughter   Mylene Arts to mod A   ADLs, transfers, cognitive retraining, motor planning    Mobility   minA bed mobility, minA transfers, minA gait with HHA x175''   supervision/CGA  family education, standing balance, endurance, DC prep    Communication   moderate dysarthria, mild apraxia, moderate to severe language deficits (expressive > receptive)   modA - on track to meet by 4/25   compensatory speech strategies, functional naming tasks, pt/family ed     Safety/Cognition/ Behavioral Observations  baseline cog deficts, moderate problem solving deficits - difficult to assess memory given baseline deficits and aphasia   modA   pt/family ed, functional problem solving tasks    Pain   Denies pain at this time   Will be free from pain   Assess pain qshift/prn    Skin   Skiin is intact   Will maintain skin intergrity with no breakdown  Assess skin qshift/prn and provide education to prevent breakdown      Discharge Planning:  Pt will d/c to home with support from family; pt dtr Hospital doctor working on arranging 24/7 care. Fam edu on 4/22 1pm-4pm. CAP/DA referral faxed back to [email or web address]. [facility] referral will be sent prior to discharge. SW will confirm there are no barriers to discharge.    Team Discussion: Patient was admitted post spontaneous bilateral left greater than right subdural hematoma.Patient has Urinary tract infection, apraxia and poor initiation:medication adjusted by MD. Patient limited by truncal ataxia, poor endurance, fatigue, moderate dysarthria and cognitive deficits.   Patient on target to meet rehab goals: No, patient requires mod-max assist with ADL's. Patient requires minimal assistance with transfers and able to ambulate  up to 175' hand held with minimal assistance. Overall goals are set for moderate assistance- supervision at discharge.   *See Care Plan and progress notes for long and short-term goals.   Revisions to Treatment Plan:  Downgraded goals Repeat CT of the head  Teaching Needs: Safety, medications, transfers, toileting , etc.    Current  Barriers to Discharge: Decreased caregiver support and Incontinence  Possible Resolutions to Barriers: Family Education     Medical Summary Current Status: pt with improving po intake. arousal is better. remains delayed and apraxic.  Barriers to Discharge: Medical stability   Possible Resolutions to [employer] Focus: adjust ritalin  for apraxia and initation.  establish plan for head CTwith NS. daily assessment of labs and pt data   Continued Need for Acute Rehabilitation Level of Care: The patient requires daily medical management by a physician with specialized training in physical medicine and rehabilitation for the following reasons: Direction of a multidisciplinary physical rehabilitation program to maximize functional independence : Yes Medical management of patient stability for increased activity during participation in an intensive rehabilitation regime.: Yes Analysis of laboratory values and/or radiology reports with any subsequent need for medication adjustment and/or medical intervention. : Yes   I attest that I was present, lead the team conference, and concur with the assessment and plan of the team.   Jerene Monks 03/01/2024, 1012am

## 2024-03-02 NOTE — Progress Notes (Signed)
 Physical Therapy Session Note  Patient Details  Name: Alison Lopez MRN: [id number] Date of Birth: 1956-06-28  Today's Date: 03/02/2024 PT Individual Time: 0805-0830 PT Individual Time Calculation (min): 25 min   Today's Date: 03/02/2024 PT Individual Time: 1101-1155 PT Individual Time Calculation (min): 54 min   Short Term Goals: Week 2:  PT Short Term Goal 1 (Week 2): STG = LTG d/t ELOS  Skilled Therapeutic Interventions/Progress Updates:     1st Session: Pt received supine in bed and agrees to therapy. No complaint of pain and requires gentle encouragement to participate. Pt performs supine to sit with cues for hand placement and use of bed features. Sit to stand with minA due to instability initially, with cues for midline orientation. Pt ambulates x175' to gym with Rt HHA and cues for forward progression, trunk stability, and sequencing for transition to sitting on plinth table. Following rest break, pt ambulates to stairs and completes x4 6" steps with modA overall using Rt handrails and PT providing HHA for descending steps. Pt noted to have had incontinence of bowel so pt ambulates back to room with same assistance and cues. Pt transitoins to bed and performs bilateral rolling with minA to allow PT to perform pericare and brief change. PT provides cues for logrolling technique as well as cueing pt to bridge for doffing/donning pants and working on core strength. Pt left supine in bed with alarm intact and all needs within reach.   2nd Session: Pt received supine in bed asleep. Slow to awaken and agreeable  to therapy with gentle encouragement. Pt completes supine to sit with minA and cues for sequencing and positioning. Pt performs ssit to stand with CGA and cues for initiation. Pt ambulates to gym with Rt HHA and minA for trunk stability and navigating obstacles in crowded environment. Following rest break, pt completes Nustep for endurance training and reciprocal coordination. Pt  initially performs with upper and lower extrmities, but transitions to jsut legs due to consistently releasing grip and handles contacting pt's trunk, risking injury. Pt completes total of 10:00 at workload of 5, with average steps per minute ~70, but requiring frequent rest breaks, despite PT cues to slow rate to allow pt to perfom activity for longer stretches of time. Following, pt ambulates x59' with Lt HHA, demonstrating improved stability with PT on Lt side. PT provides cues for posture and navigation. Pt notes pain in back during ambulation, PT provides repositioning of pt to manage pain, assisting pt into hooklying position for soft tissue release of low back. Pt verbalizes improved pain and ambulates additional x200' with same assistance and cues provided. PT assists pt into position of comfort on bed to allow for back pain relieft, providing pt with bolster behing back and pt in Lt sidelying. Pt left with alarm intact and all needs within reach.   Therapy Documentation Precautions:  Precautions Precautions: Fall Recall of Precautions/Restrictions: Impaired Precaution/Restrictions Comments: legally blind Restrictions Weight Bearing Restrictions Per Provider Order: No    Therapy/Group: Individual Therapy  Neva Barban, PT, DPT 03/02/2024, 11:55 AM

## 2024-03-02 NOTE — Progress Notes (Signed)
 Patient ID: Alison Lopez, female   DOB: Aug 25, 1956, 68 y.o.   MRN: [id number]  SW received message reporting to resubmit [facility] referral today since it did meet expedited requirements of 2 business days since submitted yesterday, so referral was rejected.  SW resubmitted referral. *SW spoke with Tracy/[facility] to confirm referral received, and will have RN call to complete phone assessment.   SW spoke with Holly/[facility] and [facility]CS referral accepted.  A[facility] accepted referral for HHPT/OT/SLP/aide.    Norval Been, MSW, LCSW Office: [phone] Cell: [phone] Fax: [phone]

## 2024-03-02 NOTE — Progress Notes (Signed)
 Occupational Therapy Session Note  Patient Details  Name: Alison Lopez MRN: [id number] Date of Birth: 06-17-1956  Today's Date: 03/02/2024 OT Individual Time: 9675-9163 OT Individual Time Calculation (min): 60 min    Short Term Goals: Week 2:  OT Short Term Goal 1 (Week 2): STG= LTG d/t ELOS  Skilled Therapeutic Interventions/Progress Updates:    Pt received supine with no c/o pain, agreeable to OT session. She did report feeling "yucky" but could not further specify. When sitting on the toilet she reported sudden sharp pain and then constant dull pain in her L posterior flank. She completed bed mobility with mod cueing for technique and (S) using bed rail. Ambulatory transfer into the bathroom with min A at the trunk. With increased time sitting on the [facility] over toilet she was able to have continent urine and BM void! Mod A for toileting tasks overall however pt had improved motor planning with very skilled, patient cueing and was able to wipe both front and back. Pt was taken via w/c to the therapy gym for time management. She worked on Agricultural consultant within functional context. Deficits addressed include sequencing, working memory, attention, and organization. She required max cueing too identify simple components of common recipes (I.e. spaghetti- sauce and pasta). She demonstrated improvements in attention within a crowded gym, attending to task despite multiple distractions. Multiple repetitions and grading up/down frequently. Pt with severe error recognition and processing deficits. She returned to her room following and was left supine with all needs met bed alarm set.   Therapy Documentation Precautions:  Precautions Precautions: Fall Recall of Precautions/Restrictions: Impaired Precaution/Restrictions Comments: legally blind Restrictions Weight Bearing Restrictions Per Provider Order: No  Therapy/Group: Individual Therapy  [name] 03/02/2024, 9:57 AM

## 2024-03-02 NOTE — Progress Notes (Signed)
 Recreational Therapy Session Note  Patient Details  Name: Alison Lopez MRN: [id number] Date of Birth: 1956/03/12 Today's Date: 03/02/2024  Pain:no c/o  Pt participated in animal assisted activity seated on Nustep with supervision.  Pt required instructional and tactile cues to locate and interact with pet partner team.  Pt enjoyed petting Dixie and stated appreciation for this visit.  Pt response:   Curry Dulski 03/02/2024, 3:27 PM

## 2024-03-02 NOTE — Plan of Care (Signed)
  Problem: RH BLADDER ELIMINATION Goal: RH STG MANAGE BLADDER WITH ASSISTANCE Description: STG Manage Bladder With minimal  Assistance Outcome: Progressing   Problem: RH SKIN INTEGRITY Goal: RH STG SKIN FREE OF INFECTION/BREAKDOWN Description: Manage skin free of infection/breakdown with minimal assistance from daughter Outcome: Progressing   Problem: RH SAFETY Goal: RH STG ADHERE TO SAFETY PRECAUTIONS W/ASSISTANCE/DEVICE Description: STG Adhere to Safety Precautions With minimal Assistance/Device. Outcome: Progressing   Problem: RH PAIN MANAGEMENT Goal: RH STG PAIN MANAGED AT OR BELOW PT'S PAIN GOAL Outcome: Progressing

## 2024-03-03 ENCOUNTER — Other Ambulatory Visit (HOSPITAL_COMMUNITY): Payer: Self-pay

## 2024-03-03 LAB — GLUCOSE, CAPILLARY
Glucose-Capillary: 96 mg/dL (ref 70–99)
Glucose-Capillary: 98 mg/dL (ref 70–99)
Glucose-Capillary: 158 mg/dL — ABNORMAL HIGH (ref 70–99)
Glucose-Capillary: 106 mg/dL — ABNORMAL HIGH (ref 70–99)

## 2024-03-03 MED ORDER — ACETAMINOPHEN 325 MG PO TABS: 650.0000 mg | ORAL_TABLET | Freq: Four times a day (QID) | ORAL | Status: DC | PRN

## 2024-03-03 MED ORDER — FERROUS SULFATE 325 (65 FE) MG PO TABS
325.0000 mg | ORAL_TABLET | Freq: Every day | ORAL | 0 refills | Status: DC
Start: 2024-03-03 — End: 2024-03-14
  Filled 2024-03-03: qty 30, 30d supply, fill #0

## 2024-03-03 MED ORDER — LEVOTHYROXINE SODIUM 125 MCG PO TABS
125.0000 ug | ORAL_TABLET | Freq: Every day | ORAL | 0 refills | Status: DC
  Filled 2024-03-03: qty 30, 30d supply, fill #0

## 2024-03-03 MED ORDER — METHYLPHENIDATE HCL 10 MG PO TABS
10.0000 mg | ORAL_TABLET | Freq: Two times a day (BID) | ORAL | 0 refills | Status: DC
  Filled 2024-03-03: qty 60, 30d supply, fill #0

## 2024-03-03 MED ORDER — VITAMIN B-12 1000 MCG PO TABS
1000.0000 ug | ORAL_TABLET | Freq: Every day | ORAL | 0 refills | Status: DC
  Filled 2024-03-03: qty 30, 30d supply, fill #0

## 2024-03-03 MED ORDER — ERGOCALCIFEROL 1.25 MG (50000 UT) PO CAPS
50000.0000 [IU] | ORAL_CAPSULE | ORAL | 0 refills | Status: DC
  Filled 2024-03-03: qty 4, 28d supply, fill #0

## 2024-03-03 MED ORDER — ASCORBIC ACID 1000 MG PO TABS
1000.0000 mg | ORAL_TABLET | Freq: Every day | ORAL | 0 refills | Status: DC
  Filled 2024-03-03: qty 30, 30d supply, fill #0
  Filled 2024-03-03: qty 30, 3d supply, fill #0

## 2024-03-03 MED ORDER — SERTRALINE HCL 25 MG PO TABS
25.0000 mg | ORAL_TABLET | Freq: Every day | ORAL | 0 refills | Status: DC
  Filled 2024-03-03: qty 30, 30d supply, fill #0

## 2024-03-03 MED ORDER — ROSUVASTATIN CALCIUM 20 MG PO TABS
20.0000 mg | ORAL_TABLET | Freq: Every day | ORAL | 0 refills | Status: DC
  Filled 2024-03-03: qty 30, 30d supply, fill #0

## 2024-03-03 MED ORDER — APIXABAN 2.5 MG PO TABS
2.5000 mg | ORAL_TABLET | Freq: Two times a day (BID) | ORAL | 0 refills | Status: DC
  Filled 2024-03-03: qty 60, 30d supply, fill #0

## 2024-03-03 MED ORDER — DONEPEZIL HCL 10 MG PO TABS
10.0000 mg | ORAL_TABLET | Freq: Every day | ORAL | 0 refills | Status: DC
  Filled 2024-03-03: qty 30, 30d supply, fill #0

## 2024-03-03 MED ORDER — MELATONIN 5 MG PO TABS
5.0000 mg | ORAL_TABLET | Freq: Every day | ORAL | 0 refills | Status: DC
  Filled 2024-03-03: qty 30, 30d supply, fill #0

## 2024-03-03 NOTE — Progress Notes (Addendum)
 Speech Language Pathology Discharge Summary  Patient Details  Name: Alison Lopez MRN: [id number] Date of Birth: 1956/10/25  Date of Discharge from SLP service:March 03, 2024  Today's Date: 03/03/2024 SLP Individual Time: 0900-1000 SLP Individual Time Calculation (min): 60 min   Skilled Therapeutic Interventions:  Pt greeted at bedside. She was resting in bed upon SLP arrival. She was pleasant and cooperative throughout tx tasks targeting cognition, motor speech, and language. Throughout tasks, she maintained 90% intelligibility or greater w/ only minA. Of note, she continues to present w/ reduced apraxic distortions as of late. During verbal tasks targeting problem solving and word finding, she benefited from modA to ID items neede to complete ADLs as well as potential problems that could occur during said ADLs. Anticipate naming deficits negatively impacted success as well. When prompted, she was able to recall music discussed during prev tx session and personal info re SLP. Only s cues required for word finding during conversation re upcoming d/c. At the end of tx tasks, she was left in bed with the alarm set and call light within reach. See d/c summary below for more info.     Patient has met 5 of 6 long term goals.  Patient to discharge at overall Mod;Max level.  Reasons goals not met: inconsistent mastery of problem solving goal   Clinical Impression/Discharge Summary:  Minimal progress noted overall this stay. However, this was anticipated given pt's baseline cognitive function and inability to carryover information. Additionally, her visual deficits negatively impact her ability to utilize memory aids of any kind. She continues to require mod-maxA overall d/t language deficits (expressive > receptive), speech production deficits, and cognitive deficits. She can complete very simple cognitive-linguistic tasks @ modA at this time. She is set to d/c home with 24/7 supervision available  from family and pt/family education is complete. She would benefit from continued ST upon d/c to target remaining deficits, maximize pt independence, and reduce caregiver burden.   Care Partner:  Caregiver Able to Provide Assistance: Yes  Type of Caregiver Assistance: Cognitive;Physical  Recommendation:  24 hour supervision/assistance;Home Health SLP  Rationale for SLP Follow Up: Maximize functional communication;Maximize cognitive function and independence;Reduce caregiver burden   Equipment: n/a   Reasons for discharge: Discharged from hospital   Patient/Family Agrees with Progress Made and Goals Achieved: Yes    [name] 03/03/2024, 8:55 AM

## 2024-03-03 NOTE — Progress Notes (Signed)
 Occupational Therapy Session Note  Patient Details  Name: Alison Lopez MRN: [id number] Date of Birth: Sep 20, 1956  Today's Date: 03/03/2024 OT Individual Time: 1020-1115 OT Individual Time Calculation (min): 55 min  and Today's Date: 03/03/2024 OT Missed Time: 15 Minutes Missed Time Reason: Patient fatigue;Pain  Short Term Goals: Week 2:  OT Short Term Goal 1 (Week 2): STG= LTG d/t ELOS  Skilled Therapeutic Interventions/Progress Updates:  Pt received resting in bed, increased encouragement required to participate in ADL session, eventually receptive to sponge-bathing at sink-side. Pt does report un-rated chest pain, LPN made aware, rest provided as needed. Supine>sit EOB with Min A + bed features, sit<>stands, and stand-pivots with CGA + no AE. Pt requires Min-Mod A to manage UB garments, HOH provided to promote independence with unthreading/threading arms. Pt bathes UB with Mod A to address B-underarms and thoroughly clean chest/abdomen. Pt requires Mod-Max A to bathe/dress LB, able to wash B-thighs and anterior pericare. Incontinent B/B in brief. Pt politely requesting to return to bed, warm blanket provided for comfort. Pt remained resting in bed, bed alarm activated, door open, missing ~15 mins of skilled intervention due to generalized fatigue/pain.   Therapy Documentation Precautions:  Precautions Precautions: Fall Recall of Precautions/Restrictions: Impaired Precaution/Restrictions Comments: legally blind Restrictions Weight Bearing Restrictions Per Provider Order: No   Therapy/Group: Individual Therapy  Artemus Biles, OTR/L, MSOT  03/03/2024, 6:20 AM

## 2024-03-03 NOTE — Progress Notes (Signed)
 PROGRESS NOTE   Subjective/Complaints:  Pt rested well again. Doesn't recall what she did with therapy yesterday. Denies pain. Anxious to go home  ROS: Patient denies fever, rash, sore throat, blurred vision, dizziness, nausea, vomiting, diarrhea, cough, shortness of breath or chest pain, joint or back/neck pain, headache, or mood change.       Objective:   CT HEAD WO CONTRAST ( ) Result Date: 03/01/2024 CLINICAL DATA:  Provided history: Subdural hematoma. EXAM: CT HEAD WITHOUT CONTRAST TECHNIQUE: Contiguous axial images were obtained from the base of the skull through the vertex without intravenous contrast. RADIATION DOSE REDUCTION: This exam was performed according to the departmental dose-optimization program which includes automated exposure control, adjustment of the mA and/or kV according to patient size and/or use of iterative reconstruction technique. COMPARISON:  Head CT 02/16/2024. FINDINGS: Brain: Generalized cerebral atrophy. The subdural hematoma demonstrated along the right cerebral convexity on the prior head CT of 02/16/2024 has essentially resolved. The mixed density subdural hematoma along the left cerebral convexity persists, but has decreased in size and now measures up to 7 mm in thickness (previously 9-10 mm). Persistent although decreased hyperdense components within this hematoma. Foci of chronic encephalomalacia/gliosis again demonstrated within the bilateral frontal, parietal and occipital lobes. Background patchy and ill-defined hypoattenuation within the cerebral white matter, nonspecific but compatible chronic small vessel ischemic disease. 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 No acute demarcated cortical infarct. No evidence of an intracranial mass. No midline shift. Vascular: No hyperdense vessel.  Atherosclerotic calcifications. Skull: No acute calvarial fracture  or aggressive osseous lesion. Burr holes within the frontoparietal calvarium bilaterally. Sinuses/Orbits: No mass or acute finding within the imaged orbits. 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 No significant paranasal sinus disease at the imaged levels. IMPRESSION: 1. A mixed density subdural hematoma along the left cerebral convexity persists, but has decreased in size since 02/16/2024, now measuring up to 7 mm in thickness. No midline shift. 2. The mixed density subdural hematoma previously demonstrated along the right cerebral convexity has essentially resolved. 3. Background parenchymal atrophy, chronic small vessel ischemic disease and chronic infarcts, as described. Electronically Signed   By: Bascom Lily D.O.   On: 03/01/2024 13:37   No results for input(s): "WBC", "HGB", "HCT", "PLT" in the last 72 hours.   No results for input(s): "NA", "K", "CL", "CO2", "GLUCOSE", "BUN", "CREATININE", "CALCIUM " in the last 72 hours.    Intake/Output Summary (Last 24 hours) at 03/03/2024 0920 Last data filed at 03/03/2024 0850 Gross per 24 hour  Intake 300 ml  Output --  Net 300 ml        Physical Exam: Vital Signs Blood pressure (!) 131/53, pulse 69, temperature 98.7 F (37.1 C), resp. rate 16, height 4\' 9"  (1.448 m), weight 49.7 kg, SpO2 94%.    Constitutional: No distress . Vital signs reviewed. HEENT: NCAT, EOMI, oral membranes moist Neck: supple Cardiovascular: RRR without murmur. No JVD    Respiratory/Chest: CTA Bilaterally without wheezes or rales. Normal effort    GI/Abdomen: BS +, non-tender, non-distended Ext: no clubbing, cyanosis, or edema Psych: pleasant and cooperative  Skin: intact; no apparent rashes or lesions.  Neurologic: Awake, alert,  oriented to self, place and time with some cues.  Follows all simple commands. +- improvement of processing speed. Engages quickly Cranial nerves: +right central VII. Dysconjugate gaze No sensory deficits.   Motor 4- out of 5 throughout R  upper extremity, 4+/5 RLE; 4+ to 5-/5 L upper and lower extremities Reflexes intact.  .   No resting tone.   No coordination deficits. + Right sided inattention -- still present    Assessment/Plan: 1. Functional deficits which require 3+ hours per day of interdisciplinary therapy in a comprehensive inpatient rehab setting. Physiatrist is providing close team supervision and 24 hour management of active medical problems listed below. Physiatrist and rehab team continue to assess barriers to discharge/monitor patient progress toward functional and medical goals  Care Tool:  Bathing    Body parts bathed by patient: Right arm, Chest, Abdomen, Left upper leg, Right upper leg, Left arm, Front perineal area, Buttocks, Face   Body parts bathed by helper: Right lower leg, Left lower leg     Bathing assist Assist Level: Moderate Assistance - Patient 50 - 74%     Upper Body Dressing/Undressing Upper body dressing   What is the patient wearing?: Pull over shirt    Upper body assist Assist Level: Maximal Assistance - Patient 25 - 49%    Lower Body Dressing/Undressing Lower body dressing      What is the patient wearing?: Incontinence brief, Underwear/pull up     Lower body assist Assist for lower body dressing: Maximal Assistance - Patient 25 - 49%     Toileting Toileting    Toileting assist Assist for toileting: Moderate Assistance - Patient 50 - 74%     Transfers Chair/bed transfer  Transfers assist     Chair/bed transfer assist level: Minimal Assistance - Patient > 75%     Locomotion Ambulation   Ambulation assist      Assist level: Contact Guard/Touching assist Assistive device: Hand held assist Max distance: 350'   Walk 10 feet activity   Assist     Assist level: Contact Guard/Touching assist Assistive device: Hand held assist   Walk 50 feet activity   Assist    Assist level: Contact Guard/Touching assist Assistive device: Hand held assist     Walk 150 feet activity   Assist Walk 150 feet activity did not occur: Safety/medical concerns  Assist level: Contact Guard/Touching assist Assistive device: Hand held assist    Walk 10 feet on uneven surface  activity   Assist     Assist level: Minimal Assistance - Patient > 75% Assistive device: Hand held assist   Wheelchair     Assist Is the patient using a wheelchair?: Yes Type of Wheelchair: Manual    Wheelchair assist level: Dependent - Patient 0% Max wheelchair distance: 200 ft    Wheelchair 50 feet with 2 turns activity    Assist        Assist Level: Dependent - Patient 0%   Wheelchair 150 feet activity     Assist      Assist Level: Dependent - Patient 0%   Blood pressure (!) 131/53, pulse 69, temperature 98.7 F (37.1 C), resp. rate 16, height 4\' 9"  (1.448 m), weight 49.7 kg, SpO2 94%.  Medical Problem List and Plan: 1. 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  Status post bur hole 01/30/2024             - patient may shower             -  ELOS/Goals:  CGA PT/OT, min assist SLP - 4/25             -Continue CIR therapies including PT, OT, and SLP   2.  Antithrombotics: -DVT/anticoagulation:  eliquis  resumed (see below)             -antiplatelet therapy: N/A 3. Pain Management: Tylenol  as needed 4. Mood/Behavior/Sleep: continue Aricept  10 mg nightly, Zoloft  25 mg daily, melatonin 5 mg nightly              -antipsychotic agents: N/A   - sleeping well  5. Neuropsych/cognition: This patient is not capable of making decisions on her own behalf.   - 4/24 ritalin  increased to 10mg . May be helping initiation/processing    6. Skin/Wound Care: Routine skin checks 7. Fluids/Electrolytes/Nutrition/dysphagia: Routine in and outs with follow-up chemistries   - 4/14: Creatinine mildly elevated, BUN stable, p.o. fluid intakes are excellent. Repeat in 2 days to trend--improved 4/16   - on  Dys 3 diet with full supervision  4/21 most recent labs stable. Needs some cueing to eat at times  4/24 generally eating better but it can fluctuate. Needs cueing/encouragement 8.  Antiphospholipid syndrome/history of DVT/thrombocytopenia secondary to antiphospholipid syndrome.  Followed by hematology services.  Chronic ELIQUIS  REMAINS ON HOLD FOR AT LEAST TWO WEEKS GIVEN SDH WITH FOLLOW UP CT HEAD (4/25).   - 4/14: Thrombocytopenia improving.   -4/23 HCT with improvement/evolution of SDH   -NS consulted and gave clearance to resume eliquis  9.  Hypothyroidism. continue Synthroid   10.  Hyperlipidemia.  continue Crestor   11.  Diabetes mellitus retinopathy/legally blind.  Latest hemoglobin A1c 4.8.  SSI   - 4/24 cbg's well controlled Recent Labs    03/02/24 1701 03/02/24 2044 03/03/24 0617  GLUCAP 95 153* 96      12. Bradycardia: continue to monitor HR TID-not recurred  - Heart rate and vital stable  13.  Anemia.  Iron  supplement added 4/12   -4/14: HgB stable 9-10  14.  Urinary incontinence/frequency overnight/dysuria.  Start low-dose Ditropan  2.5 mg nightly.  - 4-17: Monitor 1 to 2 days; would not further dose adjust due to cognitive risk factors.  If no benefit, would DC Ditropan   4/22-- UCX with 100 E Coli--should be sens to  duricef--thru 4/25 15. Throat pain   - resolved     LOS: 13 days A FACE TO FACE EVALUATION WAS PERFORMED  Rawland Caddy 03/03/2024, 9:20 AM

## 2024-03-03 NOTE — Progress Notes (Signed)
 Physical Therapy Discharge Summary  Patient Details  Name: Alison Lopez MRN: [id number] Date of Birth: [birth year]  Date of Discharge from PT service:March 03, 2024  Today's Date: 03/03/2024 PT Individual Time: 4098-1191 PT Individual Time Calculation (min): 73 min    Patient has met 8 of 10 long term goals due to improved activity tolerance, improved balance, improved postural control, improved attention, improved awareness, and improved coordination.  Patient to discharge at an ambulatory level  CGA .   Patient's care partner is independent to provide the necessary physical and cognitive assistance at discharge.  Reasons goals not met: Pt requires CGA to minA for car transfer and stair training due to ongoing issues with Rt inattention and vision impairments. Daughter is independent with providing necessary support.   Recommendation:  Patient will benefit from ongoing skilled PT services in outpatient setting to continue to advance safe functional mobility, address ongoing impairments in balance, strength, ambulation, endurance, and minimize fall risk.  Equipment: No equipment provided  Reasons for discharge: discharge from hospital  Patient/family agrees with progress made and goals achieved: Yes  Skilled Therapeutic Interventions: Pt received supine in bed and agrees to therapy. No complaint of pain. Bed mobility with supervision and cues for positioning at EOB. Pt performs sit to stand with CGA and cues for initiation. Pt ambulates x300' to gym with Lt HHA and CGA, with cues for navigation, posture, and increasing gait speed to improve balance. Pt completes ramp navigation and car transfer with CGA and cues for sequencing. Following rest break, pt ambulate x150' to main gym with sam cues and assistance. Pt takes extended seated rest break. Pt completes x12 6" steps with Rt handrails and HHA while descending, requiring minA and max verbal cues for safety, with facilitation of safe  step sequencing and foot placement. Pt ambulates to dayroom and complete Nustep for endurance training and reciprocal coordination> pt completes x10:00 at workload of 6 with only legs to focus loading through lower extremities. PT provides cues for foot placement and completing full available ROM. Pt ambulates back to room with same assistance. Left supine with alarm intact and all needs within reach.   PT Discharge Precautions/Restrictions Precautions Precautions: Fall Recall of Precautions/Restrictions: Impaired Precaution/Restrictions Comments: legally blind Restrictions Weight Bearing Restrictions Per Provider Order: No Pain Interference Pain Interference Pain Effect on Sleep: 1. Rarely or not at all Pain Interference with Therapy Activities: 1. Rarely or not at all Pain Interference with Day-to-Day Activities: 1. Rarely or not at all Vision/Perception  Vision - History Ability to See in Adequate Light: 4 Severely impaired Perception Perception: Impaired Preception Impairment Details: Inattention/Neglect;Spatial orientation Praxis Praxis: Impaired Praxis Impairment Details: Motor planning;Ideomotor;Perseveration;Limb apraxia  Cognition Overall Cognitive Status: Impaired/Different from baseline Arousal/Alertness: Awake/alert Orientation Level: Oriented to person;Oriented to place Year: 2025 Month: April Attention: Sustained Focused Attention: Appears intact Sustained Attention: Impaired Organizing: Impaired Safety/Judgment: Impaired Comments: All grossly impaired 2/2 severe cognitive deficits Sensation Sensation Light Touch: Impaired Detail Light Touch Impaired Details: Impaired RUE;Impaired RLE Proprioception: Impaired Detail Proprioception Impaired Details: Impaired RUE;Impaired RLE Stereognosis: Impaired Detail Stereognosis Impaired Details: Impaired RUE;Impaired LUE Coordination Gross Motor Movements are Fluid and Coordinated: No Fine Motor Movements are Fluid and  Coordinated: No Coordination and Movement Description: R hemiparesis with inattention Finger Nose Finger Test: Severely impaired- combo of visual deficits and R inattention Motor  Motor Motor: Hemiplegia;Abnormal postural alignment and control;Motor apraxia  Mobility Bed Mobility Bed Mobility: Supine to Sit;Sit to Supine Supine to Sit: Supervision/Verbal cueing Sit to  Supine: Supervision/Verbal cueing Transfers Sit to Stand: Contact Guard/Touching assist Stand to Sit: Contact Guard/Touching assist Stand Pivot Transfers: Contact Guard/Touching assist Stand Pivot Transfer Details: Verbal cues for technique;Verbal cues for precautions/safety Transfer (Assistive device): 1 person hand held assist Locomotion  Gait Ambulation: Yes Gait Assistance: Contact Guard/Touching assist Gait Distance (Feet): 300 Feet Assistive device: 1 person hand held assist Gait Assistance Details: Tactile cues for sequencing;Verbal cues for technique;Verbal cues for sequencing;Verbal cues for precautions/safety Gait Gait: Yes Gait Pattern: Wide base of support;Decreased stride length Gait velocity: decreased Stairs / Additional Locomotion Stairs: Yes Stairs Assistance: Minimal Assistance - Patient > 75% Stair Management Technique: One rail Right Number of Stairs: 12 Height of Stairs: 6 Ramp: Contact Guard/touching assist Curb: Minimal Assistance - Patient >75% Wheelchair Mobility Wheelchair Mobility: No  Trunk/Postural Assessment  Cervical Assessment Cervical Assessment: Exceptions to [facility] (forward head) Thoracic Assessment Thoracic Assessment: Exceptions to [facility] (rounded head) Lumbar Assessment Lumbar Assessment: Exceptions to [facility] (posterior pelvic tilt) Postural Control Postural Control: Deficits on evaluation Trunk Control: leans to the right Righting Reactions: delayed Protective Responses: delayed  Balance Balance Balance Assessed: Yes Static Sitting Balance Static Sitting - Balance  Support: Bilateral upper extremity supported;Feet supported Static Sitting - Level of Assistance: 5: Stand by assistance Dynamic Sitting Balance Dynamic Sitting - Balance Support: No upper extremity supported;Feet supported Dynamic Sitting - Level of Assistance: 4: Min assist Static Standing Balance Static Standing - Balance Support: During functional activity;Right upper extremity supported Static Standing - Level of Assistance: 4: Min assist Dynamic Standing Balance Dynamic Standing - Balance Support: During functional activity;Right upper extremity supported Dynamic Standing - Level of Assistance: 4: Min assist Extremity Assessment  RLE Assessment RLE Assessment: Exceptions to [facility] General Strength Comments: Grossly 3/5 LLE Assessment LLE Assessment: Exceptions to [facility] General Strength Comments: Grossly 3+/5   Neva Barban, PT, DPT 03/03/2024, 4:39 PM

## 2024-03-03 NOTE — Plan of Care (Signed)
  Problem: RH BOWEL ELIMINATION Goal: RH STG MANAGE BOWEL WITH ASSISTANCE Description: STG Manage Bowel with minimal  Assistance. Outcome: Progressing   Problem: RH SKIN INTEGRITY Goal: RH STG SKIN FREE OF INFECTION/BREAKDOWN Description: Manage skin free of infection/breakdown with minimal assistance from daughter Outcome: Progressing   Problem: RH SAFETY Goal: RH STG ADHERE TO SAFETY PRECAUTIONS W/ASSISTANCE/DEVICE Description: STG Adhere to Safety Precautions With minimal Assistance/Device. Outcome: Progressing   Problem: RH PAIN MANAGEMENT Goal: RH STG PAIN MANAGED AT OR BELOW PT'S PAIN GOAL Outcome: Progressing

## 2024-03-03 NOTE — Plan of Care (Signed)
  Problem: RH Problem Solving Goal: LTG Patient will demonstrate problem solving for (SLP) Description: LTG:  Patient will demonstrate problem solving for basic/complex daily situations with cues  (SLP) Outcome: Not Met (add Reason) Note: Inconsistent mastery   Problem: RH Swallowing Goal: LTG Patient will participate in dysphagia therapy to increase swallow function with assistance (SLP) Description: LTG:  Patient will participate in dysphagia therapy to increase swallow function with assistance (SLP) Outcome: Completed/Met   Problem: RH Comprehension Communication Goal: LTG Patient will comprehend basic/complex auditory (SLP) Description: LTG: Patient will comprehend basic/complex auditory information with cues (SLP). Outcome: Completed/Met   Problem: RH Expression Communication Goal: LTG Patient will verbally express basic/complex needs(SLP) Description: LTG:  Patient will verbally express basic/complex needs, wants or ideas with cues  (SLP) Outcome: Completed/Met Goal: LTG Patient will increase word finding of common (SLP) Description: LTG:  Patient will increase word finding of common objects/daily info/abstract thoughts with cues using compensatory strategies (SLP). Outcome: Completed/Met   Problem: RH Expression Communication Goal: LTG Patient will increase speech intelligibility (SLP) Description: LTG: Patient will increase speech intelligibility at word/phrase/conversation level with cues, % of the time (SLP) Outcome: Completed/Met

## 2024-03-03 NOTE — Progress Notes (Incomplete)
 Inpatient Rehabilitation Discharge Medication Review by a Pharmacist  A complete drug regimen review was completed for this patient to identify any potential clinically significant medication issues.  High Risk Drug Classes Is patient taking? Indication by Medication  Antipsychotic No   Anticoagulant Yes Apixaban  - APL w/ hx DVT  Antibiotic No   Opioid No   Antiplatelet No   Hypoglycemics/insulin  No   Vasoactive Medication No   Chemotherapy No   Other Yes Methylphenidate  - alertness Ferrous sulfate , Vit C, Vit B12, Vit D - supplement Donepezil  - dementia Levothyroxine  - hypothyroid Melatonin - insomnia Rosuvastatin  - HLD Sertraline  - mood     Type of Medication Issue Identified Description of Issue Recommendation(s)  Drug Interaction(s) (clinically significant)     Duplicate Therapy     Allergy     No Medication Administration End Date     Incorrect Dose     Additional Drug Therapy Needed     Significant med changes from prior encounter (inform family/care partners about these prior to discharge). Apixaban  dose reduced 5mg  BID > 2.5mg  BID  Communicate relevant medication changes to patient/family members at discharge from CIR.   Other       Clinically significant medication issues were identified that warrant physician communication and completion of prescribed/recommended actions by midnight of the next day:  No  Pharmacist comments: n/a  Time spent performing this drug regimen review (minutes): 20   Thank you for allowing pharmacy to be a part of this patient's care.  ***

## 2024-03-03 NOTE — Plan of Care (Signed)
  Problem: RH Balance Goal: LTG Patient will maintain dynamic standing with ADLs (OT) Description: LTG:  Patient will maintain dynamic standing balance with assist during activities of daily living (OT)  Outcome: Not Met (add Reason)   Problem: Sit to Stand Goal: LTG:  Patient will perform sit to stand in prep for activites of daily living with assistance level (OT) Description: LTG:  Patient will perform sit to stand in prep for activites of daily living with assistance level (OT) Outcome: Not Met (add Reason)   Problem: RH Eating Goal: LTG Patient will perform eating w/assist, cues/equip (OT) Description: LTG: Patient will perform eating with assist, with/without cues using equipment (OT) Outcome: Completed/Met   Problem: RH Grooming Goal: LTG Patient will perform grooming w/assist,cues/equip (OT) Description: LTG: Patient will perform grooming with assist, with/without cues using equipment (OT) Outcome: Completed/Met   Problem: RH Bathing Goal: LTG Patient will bathe all body parts with assist levels (OT) Description: LTG: Patient will bathe all body parts with assist levels (OT) Outcome: Completed/Met   Problem: RH Dressing Goal: LTG Patient will perform upper body dressing (OT) Description: LTG Patient will perform upper body dressing with assist, with/without cues (OT). Outcome: Not Met (add Reason) Goal: LTG Patient will perform lower body dressing w/assist (OT) Description: LTG: Patient will perform lower body dressing with assist, with/without cues in positioning using equipment (OT) Outcome: Not Met (add Reason)   Problem: RH Toileting Goal: LTG Patient will perform toileting task (3/3 steps) with assistance level (OT) Description: LTG: Patient will perform toileting task (3/3 steps) with assistance level (OT)  Outcome: Completed/Met   Problem: RH Functional Use of Upper Extremity Goal: LTG Patient will use RT/LT upper extremity as a (OT) Description: LTG: Patient  will use right/left upper extremity as a stabilizer/gross assist/diminished/nondominant/dominant level with assist, with/without cues during functional activity (OT) Outcome: Completed/Met   Problem: RH Toilet Transfers Goal: LTG Patient will perform toilet transfers w/assist (OT) Description: LTG: Patient will perform toilet transfers with assist, with/without cues using equipment (OT) Outcome: Not Met (add Reason)   Problem: RH Tub/Shower Transfers Goal: LTG Patient will perform tub/shower transfers w/assist (OT) Description: LTG: Patient will perform tub/shower transfers with assist, with/without cues using equipment (OT) Outcome: Not Met (add Reason)   Problem: RH Attention Goal: LTG Patient will demonstrate this level of attention during functional activites (OT) Description: LTG:  Patient will demonstrate this level of attention during functional activites  (OT) Outcome: Completed/Met   Problem: RH Awareness Goal: LTG: Patient will demonstrate awareness during functional activites type of (OT) Description: LTG: Patient will demonstrate awareness during functional activites type of (OT) Outcome: Not Met (add Reason)

## 2024-03-04 ENCOUNTER — Other Ambulatory Visit (HOSPITAL_COMMUNITY): Payer: Self-pay

## 2024-03-04 DIAGNOSIS — D62 Acute posthemorrhagic anemia: Secondary | ICD-10-CM

## 2024-03-04 DIAGNOSIS — E11319 Type 2 diabetes mellitus with unspecified diabetic retinopathy without macular edema: Secondary | ICD-10-CM

## 2024-03-04 LAB — GLUCOSE, CAPILLARY
Glucose-Capillary: 98 mg/dL (ref 70–99)
Glucose-Capillary: 121 mg/dL — ABNORMAL HIGH (ref 70–99)

## 2024-03-04 NOTE — Progress Notes (Signed)
 Inpatient Rehabilitation Care Coordinator Discharge Note   Patient Details  Name: Alison Lopez MRN: [id number] Date of Birth: 07-25-1956   Discharge location: D/c to home  Length of Stay: 13 days  Discharge activity level: CGA  Home/community participation: Limited  Patient response QI:HKVQQV Literacy - How often do you need to have someone help you when you read instructions, pamphlets, or other written material from your doctor or pharmacy?: Always  Patient response ZD:GLOVFI Isolation - How often do you feel lonely or isolated from those around you?: Patient unable to respond  Services provided included: MD, PT, RD, OT, SLP, RN, CM, Pharmacy, Neuropsych, SW, TR  Financial Services:  Field seismologist Utilized: Private Insurance [employer]  Choices offered to/list presented to: patient daughter  Follow-up services arranged:  Home Health, DME Home Health Agency: [facility] for HHPT/OT/SLP/aide    DME : [facility]TB and [employer] [facility] with [employer]    Patient response to transportation need: Is the patient able to respond to transportation needs?: Yes In the past 12 months, has lack of transportation kept you from medical appointments or from getting medications?: No In the past 12 months, has lack of transportation kept you from meetings, work, or from getting things needed for daily living?: No   Patient/Family verbalized understanding of follow-up arrangements:  Yes  Individual responsible for coordination of the follow-up plan: contact pt dtr Amber [phone]  Confirmed correct DME delivered: Rennis Case 03/04/2024    Comments (or additional information):fam edu completed.CAP/DA referral submitted and PCS referral accepted at this time; their RN will schedule an assessment to confirm hours needed.   Summary of Stay    Date/Time Discharge Planning CSW  02/29/24 1507 Pt will d/c to home with support from family; pt dtr Amber working on arranging 24/7  care. Fam edu on 4/22 1pm-4pm. CAP/DA referral faxed back to [facility]. [facility] referral will be sent prior to discharge. SW will confirm there are no barriers to discharge. AAC  02/22/24 0950 Pt will d/c to home with support from family; pt dtr Amber working on arranging 24/7 care. SW will confirm there are no barriers to discharge. AAC       Darletta Noblett A Brendolyn Callas

## 2024-03-04 NOTE — Progress Notes (Signed)
 Inpatient Rehabilitation Discharge Medication Review by a Pharmacist  A complete drug regimen review was completed for this patient to identify any potential clinically significant medication issues.  High Risk Drug Classes Is patient taking? Indication by Medication  Antipsychotic No   Anticoagulant Yes Apixaban  - APL w/ hx DVT  Antibiotic No   Opioid No   Antiplatelet No   Hypoglycemics/insulin  No   Vasoactive Medication No   Chemotherapy No   Other Yes Methylphenidate  - alertness Ferrous sulfate , Vit C, Vit B12, Vit D - supplement Donepezil  - dementia Levothyroxine  - hypothyroid Melatonin - insomnia Rosuvastatin  - HLD Sertraline  - mood Tylenol  - pain     Type of Medication Issue Identified Description of Issue Recommendation(s)  Drug Interaction(s) (clinically significant)     Duplicate Therapy     Allergy     No Medication Administration End Date     Incorrect Dose     Additional Drug Therapy Needed     Significant med changes from prior encounter (inform family/care partners about these prior to discharge).    Other       Clinically significant medication issues were identified that warrant physician communication and completion of prescribed/recommended actions by midnight of the next day:  No  Pharmacist comments: n/a  Time spent performing this drug regimen review (minutes): 20   Ivery Marking, PharmD, [place], AAHIVP, CPP Infectious Disease Pharmacist 03/04/2024 7:55 AM

## 2024-03-04 NOTE — Progress Notes (Signed)
 PROGRESS NOTE   Subjective/Complaints:  Pt in bed. NT feeding her breakfast. No complaints. Remembers that she's going home today.   ROS: Limited due to cognitive/behavioral       Objective:   No results found.  No results for input(s): "WBC", "HGB", "HCT", "PLT" in the last 72 hours.   No results for input(s): "NA", "K", "CL", "CO2", "GLUCOSE", "BUN", "CREATININE", "CALCIUM " in the last 72 hours.    Intake/Output Summary (Last 24 hours) at 03/04/2024 0941 Last data filed at 03/04/2024 0840 Gross per 24 hour  Intake 540 ml  Output 0 ml  Net 540 ml        Physical Exam: Vital Signs Blood pressure (!) 129/58, pulse 64, temperature 98.1 F (36.7 C), resp. rate 17, height 4\' 9"  (1.448 m), weight 49.7 kg, SpO2 97%.    Constitutional: No distress . Vital signs reviewed. HEENT: NCAT, EOMI, oral membranes moist Neck: supple Cardiovascular: RRR without murmur. No JVD    Respiratory/Chest: CTA Bilaterally without wheezes or rales. Normal effort    GI/Abdomen: BS +, non-tender, non-distended Ext: no clubbing, cyanosis, or edema Psych: pleasant and cooperative  Skin: intact; no apparent rashes or lesions.  Neurologic: Awake, alert, oriented to self, place and time with some cues.  Follows all simple commands. +- improvement of processing speed. Engages quickly Cranial nerves: +right central VII. Dysconjugate gaze. Vision limited No sensory deficits.   Motor 4- out of 5 throughout R upper extremity, 4+/5 RLE; 4+ to 5-/5 L upper and lower extremities Reflexes intact.  .   No resting tone.   No coordination deficits. + Right sided inattention -- still present    Assessment/Plan: 1. Functional deficits which require 3+ hours per day of interdisciplinary therapy in a comprehensive inpatient rehab setting. Physiatrist is providing close team supervision and 24 hour management of active medical problems listed  below. Physiatrist and rehab team continue to assess barriers to discharge/monitor patient progress toward functional and medical goals  Care Tool:  Bathing    Body parts bathed by patient: Right arm, Chest, Abdomen, Left upper leg, Right upper leg, Left arm, Front perineal area, Buttocks, Face   Body parts bathed by helper: Right lower leg, Left lower leg     Bathing assist Assist Level: Moderate Assistance - Patient 50 - 74%     Upper Body Dressing/Undressing Upper body dressing   What is the patient wearing?: Pull over shirt    Upper body assist Assist Level: Maximal Assistance - Patient 25 - 49%    Lower Body Dressing/Undressing Lower body dressing      What is the patient wearing?: Incontinence brief, Underwear/pull up     Lower body assist Assist for lower body dressing: Maximal Assistance - Patient 25 - 49%     Toileting Toileting    Toileting assist Assist for toileting: Moderate Assistance - Patient 50 - 74%     Transfers Chair/bed transfer  Transfers assist     Chair/bed transfer assist level: Contact Guard/Touching assist     Locomotion Ambulation   Ambulation assist      Assist level: Contact Guard/Touching assist Assistive device: Hand held assist Max distance: 300'  Walk 10 feet activity   Assist     Assist level: Contact Guard/Touching assist Assistive device: Hand held assist   Walk 50 feet activity   Assist    Assist level: Contact Guard/Touching assist Assistive device: Hand held assist    Walk 150 feet activity   Assist Walk 150 feet activity did not occur: Safety/medical concerns  Assist level: Contact Guard/Touching assist Assistive device: Hand held assist    Walk 10 feet on uneven surface  activity   Assist     Assist level: Supervision/Verbal cueing Assistive device: Hand held assist   Wheelchair     Assist Is the patient using a wheelchair?: No Type of Wheelchair: Manual    Wheelchair  assist level: Dependent - Patient 0% Max wheelchair distance: 200 ft    Wheelchair 50 feet with 2 turns activity    Assist        Assist Level: Dependent - Patient 0%   Wheelchair 150 feet activity     Assist      Assist Level: Dependent - Patient 0%   Blood pressure (!) 129/58, pulse 64, temperature 98.1 F (36.7 C), resp. rate 17, height 4\' 9"  (1.448 m), weight 49.7 kg, SpO2 97%.  Medical Problem List and Plan: 1. 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  Status post bur hole 01/30/2024             - patient may shower             - ELOS/Goals:  CGA PT/OT, min assist SLP - 4/25            -dc home today. Follow up with [facility], NS, primary  2.  Antithrombotics: -DVT/anticoagulation:  eliquis  resumed (see below)             -antiplatelet therapy: N/A 3. Pain Management: Tylenol  as needed 4. Mood/Behavior/Sleep: continue Aricept  10 mg nightly, Zoloft  25 mg daily, melatonin 5 mg nightly              -antipsychotic agents: N/A   - sleeping well  5. Neuropsych/cognition: This patient is not capable of making decisions on her own behalf.   - 4/24 ritalin  increased to 10mg .  Seems to be helping initiation/processing    6. Skin/Wound Care: Routine skin checks 7. Fluids/Electrolytes/Nutrition/dysphagia: Routine in and outs with follow-up chemistries   - 4/14: Creatinine mildly elevated, BUN stable, p.o. fluid intakes are excellent. Repeat in 2 days to trend--improved 4/16   - on Dys 3 diet with full supervision  4/21 most recent labs stable.    4/24 generally eating better but it can fluctuate. Needs assistance as well as cueing/encouragement 8.  Antiphospholipid syndrome/history of DVT/thrombocytopenia secondary to antiphospholipid syndrome.  Followed by hematology services.  Chronic ELIQUIS  REMAINS ON HOLD FOR AT LEAST TWO WEEKS GIVEN SDH WITH FOLLOW UP CT HEAD (4/25).   - 4/14: Thrombocytopenia improving.    -4/23 HCT with improvement/evolution of SDH   -NS consulted and gave clearance to resume eliquis  9.  Hypothyroidism. continue Synthroid   10.  Hyperlipidemia.  continue Crestor   11.  Diabetes mellitus retinopathy/legally blind.  Latest hemoglobin A1c 4.8.  SSI   - 4/24 cbg's well controlled Recent Labs    03/03/24 1618 03/03/24 2124 03/04/24 0646  GLUCAP 158* 106* 98      12. Bradycardia: continue to monitor HR TID-not recurred  - Heart rate and vital stable  13.  Anemia.  Iron  supplement added 4/12   -  4/14: HgB stable 9-10   F/u hgb as outpt 14.  Urinary incontinence/frequency overnight/dysuria.  Start low-dose Ditropan  2.5 mg nightly.  - 4-17: Monitor 1 to 2 days; would not further dose adjust due to cognitive risk factors.  If no benefit, would DC Ditropan   4/25-- UCX with 100 E Coli--  duricef--thru today 15. Throat pain   - resolved     LOS: 14 days A FACE TO FACE EVALUATION WAS PERFORMED  Rawland Caddy 03/04/2024, 9:41 AM

## 2024-03-04 NOTE — Progress Notes (Signed)
 Patient ID: Alison Lopez, female   DOB: [birth year], 68 y.o.   MRN: [id number]  SW spoke with [name]/Cliincal Manager with [facility] who reports she will reach out to family to schedule home visit. Would like dc summary once available faxed to [phone].  SW spoke with pt dtr Alison Lopez to inform on above, and HHA in place. She is aware all information is in discharge instructions.   Norval Been, MSW, LCSW Office: [phone] Cell: [phone] Fax: [phone]

## 2024-03-04 NOTE — Plan of Care (Signed)
  Problem: Consults Goal: RH BRAIN INJURY PATIENT EDUCATION Description: Description: See Patient Education module for eduction specifics Outcome: Progressing   Problem: RH BOWEL ELIMINATION Goal: RH STG MANAGE BOWEL WITH ASSISTANCE Description: STG Manage Bowel with minimal  Assistance. Outcome: Progressing   Problem: RH BLADDER ELIMINATION Goal: RH STG MANAGE BLADDER WITH ASSISTANCE Description: STG Manage Bladder With minimal  Assistance Outcome: Progressing   Problem: RH SKIN INTEGRITY Goal: RH STG SKIN FREE OF INFECTION/BREAKDOWN Description: Manage skin free of infection/breakdown with minimal assistance from daughter Outcome: Progressing   Problem: RH SAFETY Goal: RH STG ADHERE TO SAFETY PRECAUTIONS W/ASSISTANCE/DEVICE Description: STG Adhere to Safety Precautions With minimal Assistance/Device. Outcome: Progressing   Problem: RH COGNITION-NURSING Goal: RH STG USES MEMORY AIDS/STRATEGIES W/ASSIST TO PROBLEM SOLVE Description: STG Uses Memory Aids/Strategies With minimal Assistance to Problem Solve. Outcome: Progressing   Problem: RH PAIN MANAGEMENT Goal: RH STG PAIN MANAGED AT OR BELOW PT'S PAIN GOAL Outcome: Progressing   Problem: RH KNOWLEDGE DEFICIT BRAIN INJURY Goal: RH STG INCREASE KNOWLEDGE OF SELF CARE AFTER BRAIN INJURY Description: Manage increase  knowledge of self care after brain injury with minimal assistance  from daughter using educational materials provided Outcome: Progressing   Problem: Consults Goal: RH STROKE PATIENT EDUCATION Description: See Patient Education module for education specifics  Outcome: Progressing

## 2024-03-07 ENCOUNTER — Telehealth: Payer: Self-pay | Admitting: Physical Medicine and Rehabilitation

## 2024-03-07 ENCOUNTER — Telehealth: Payer: Self-pay

## 2024-03-07 NOTE — Telephone Encounter (Signed)
 Called to schedule hospital follow up with patient ,spoke to daughter she would like to call us  back to schedule due to her work schedule

## 2024-03-07 NOTE — Telephone Encounter (Signed)
 Transitional Care Call--who you spoke with    Are you/is patient experiencing any problems since coming home?  No  Are there any questions regarding any aspect of care?  No Are there any questions regarding medications administration/dosing? No  Are meds being taken as prescribed?  Yes  Patient should review meds with caller to confirm. Medications listed on discharge summary reviewed with daughter, daughter cannot remember if patient is taking Synthroid .         2.   Have there been any falls? No       3.   Has Home Health been to the house and/or have they contacted  you? Nurse and   Physical Therapist came out today to see patient.              If not, have you tried to contact them?  N/A  Can we help you contact them?  N/A       4.  Are bowels and bladder emptying properly? Yes  Are there any unexpected incontinence issues? No              If applicable, is patient following bowel/bladder programs? N/A Any fevers, problems with breathing, unexpected pain? No Are there any skin problems or new areas of breakdown? No Has the patient/family member arranged specialty MD follow up (ie cardiology/neurology/renal/surgical/etc)? Daughter reports currently working with Providers office to get appointments scheduled/rescheduled due to work schedule.  Can we help arrange?  No Does the patient need any other services or support that we can help arrange? No (daughter states they have a good support system in place and services have been offered) Are caregivers following through as expected in assisting the patient? Yes        11. Has the patient quit smoking, drinking alcohol, or using drugs as recommended?  Not a smoker   Appointment  [address]

## 2024-03-08 ENCOUNTER — Encounter: Admitting: Neurosurgery

## 2024-03-09 ENCOUNTER — Encounter: Payer: Self-pay | Admitting: Podiatry

## 2024-03-09 ENCOUNTER — Ambulatory Visit (INDEPENDENT_AMBULATORY_CARE_PROVIDER_SITE_OTHER): Admitting: Podiatry

## 2024-03-09 VITALS — Ht <= 58 in | Wt 109.0 lb

## 2024-03-09 DIAGNOSIS — L601 Onycholysis: Secondary | ICD-10-CM | POA: Diagnosis not present

## 2024-03-09 DIAGNOSIS — M79674 Pain in right toe(s): Secondary | ICD-10-CM | POA: Diagnosis not present

## 2024-03-09 DIAGNOSIS — M79675 Pain in left toe(s): Secondary | ICD-10-CM

## 2024-03-09 DIAGNOSIS — B351 Tinea unguium: Secondary | ICD-10-CM

## 2024-03-09 NOTE — Patient Instructions (Signed)

## 2024-03-09 NOTE — Progress Notes (Signed)
  Subjective:  Patient ID: Alison Lopez, female    DOB: 1956/04/03,  MRN: [id number]  Chief Complaint  Patient presents with   Diabetes    Patient has dementia daughter is with her Patient is here for bilateral total nail removal and nail trim  of other nails.    68 y.o. female presents with the above complaint. History confirmed with patient.  Here with her daughter who confirms the history  Objective:  Physical Exam: warm, good capillary refill, nail exam onychomycosis of all 10 toenails and onycholysis right hallux, no trophic changes or ulcerative lesions, normal DP and PT pulses, and normal sensory exam. Assessment:   1. Pain due to onychomycosis of toenails of both feet   2. Onycholysis       Plan:  Patient was evaluated and treated and all questions answered.  Discussed the etiology and treatment options for the condition in detail with the patient. Recommended debridement of the nails today. Sharp and mechanical debridement performed of all painful and mycotic nails today. Nails debrided in length and thickness using a nail nipper to level of comfort. Discussed treatment options including appropriate shoe gear. Follow up as needed for painful nails.  The right hallux nail had significant onycholysis.  Debridement was not not going to be sufficient to alleviate her pain and I recommended avulsion of the nail plate.  Following consent and prep with alcohol the right hallux was anesthetized with lidocaine  Marcaine 15 mg each.  The hallux was prepared with Betadine exsanguinated and the tourniquet secured around the base of the toe.  The right hallux nail plate was removed.  It was irrigated with alcohol no phenol matricectomy was performed.  He was dressed with Silvadene and compression gauze bandage.   Return in about 3 months (around 06/08/2024) for painful thick fungal nails.

## 2024-03-10 ENCOUNTER — Encounter: Admitting: Neurosurgery

## 2024-03-14 ENCOUNTER — Inpatient Hospital Stay (HOSPITAL_BASED_OUTPATIENT_CLINIC_OR_DEPARTMENT_OTHER): Admitting: Oncology

## 2024-03-14 ENCOUNTER — Encounter: Payer: Self-pay | Admitting: Oncology

## 2024-03-14 ENCOUNTER — Inpatient Hospital Stay: Attending: Oncology

## 2024-03-14 VITALS — BP 119/63 | HR 77 | Temp 96.3°F | Resp 18 | Wt 102.3 lb

## 2024-03-14 DIAGNOSIS — D6861 Antiphospholipid syndrome: Secondary | ICD-10-CM | POA: Diagnosis not present

## 2024-03-14 DIAGNOSIS — Z86718 Personal history of other venous thrombosis and embolism: Secondary | ICD-10-CM

## 2024-03-14 DIAGNOSIS — D696 Thrombocytopenia, unspecified: Secondary | ICD-10-CM | POA: Diagnosis not present

## 2024-03-14 DIAGNOSIS — Z87891 Personal history of nicotine dependence: Secondary | ICD-10-CM | POA: Insufficient documentation

## 2024-03-14 DIAGNOSIS — Z800 Family history of malignant neoplasm of digestive organs: Secondary | ICD-10-CM | POA: Diagnosis not present

## 2024-03-14 DIAGNOSIS — Z7901 Long term (current) use of anticoagulants: Secondary | ICD-10-CM | POA: Insufficient documentation

## 2024-03-14 DIAGNOSIS — E538 Deficiency of other specified B group vitamins: Secondary | ICD-10-CM | POA: Insufficient documentation

## 2024-03-14 LAB — CMP (CANCER CENTER ONLY)
ALT: 13 U/L (ref 0–44)
AST: 19 U/L (ref 15–41)
Albumin: 3.4 g/dL — ABNORMAL LOW (ref 3.5–5.0)
Alkaline Phosphatase: 66 U/L (ref 38–126)
Anion gap: 10 (ref 5–15)
BUN: 20 mg/dL (ref 8–23)
CO2: 23 mmol/L (ref 22–32)
Calcium: 8.9 mg/dL (ref 8.9–10.3)
Chloride: 105 mmol/L (ref 98–111)
Creatinine: 0.91 mg/dL (ref 0.44–1.00)
GFR, Estimated: >60 mL/min (ref >60)
Glucose, Bld: 178 mg/dL — ABNORMAL HIGH (ref 70–99)
Potassium: 4.6 mmol/L (ref 3.5–5.1)
Sodium: 138 mmol/L (ref 135–145)
Total Bilirubin: 0.4 mg/dL (ref 0.0–1.2)
Total Protein: 7 g/dL (ref 6.5–8.1)

## 2024-03-14 LAB — CBC WITH DIFFERENTIAL (CANCER CENTER ONLY)
Abs Immature Granulocytes: 0.06 10*3/uL (ref 0.00–0.07)
Basophils Absolute: 0.1 10*3/uL (ref 0.0–0.1)
Basophils Relative: 1 %
Eosinophils Absolute: 0.2 10*3/uL (ref 0.0–0.5)
Eosinophils Relative: 3 %
HCT: 37.9 % (ref 36.0–46.0)
Hemoglobin: 12.5 g/dL (ref 12.0–15.0)
Immature Granulocytes: 1 %
Lymphocytes Relative: 12 %
Lymphs Abs: 1.1 10*3/uL (ref 0.7–4.0)
MCH: 28.6 pg (ref 26.0–34.0)
MCHC: 33 g/dL (ref 30.0–36.0)
MCV: 86.7 fL (ref 80.0–100.0)
Monocytes Absolute: 0.5 10*3/uL (ref 0.1–1.0)
Monocytes Relative: 5 %
Neutro Abs: 7.1 10*3/uL (ref 1.7–7.7)
Neutrophils Relative %: 78 %
Platelet Count: 123 10*3/uL — ABNORMAL LOW (ref 150–400)
RBC: 4.37 MIL/uL (ref 3.87–5.11)
RDW: 13.3 % (ref 11.5–15.5)
WBC Count: 9 10*3/uL (ref 4.0–10.5)
nRBC: 0 % (ref 0.0–0.2)

## 2024-03-14 LAB — IMMATURE PLATELET FRACTION: Immature Platelet Fraction: 13.4 % — ABNORMAL HIGH (ref 1.2–8.6)

## 2024-03-14 MED ORDER — FERROUS SULFATE 325 (65 FE) MG PO TABS: 325.0000 mg | ORAL_TABLET | Freq: Every day | ORAL | 0 refills | Status: DC

## 2024-03-14 MED ORDER — APIXABAN 2.5 MG PO TABS: 2.5000 mg | ORAL_TABLET | Freq: Two times a day (BID) | ORAL | 2 refills | Status: DC

## 2024-03-14 MED ORDER — VITAMIN B-12 1000 MCG PO TABS: 1000.0000 ug | ORAL_TABLET | Freq: Every day | ORAL | 1 refills | Status: DC

## 2024-03-14 NOTE — Assessment & Plan Note (Signed)
 Previously failed Coumadin.   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 Patient did not tolerate Eliquis  5 mg twice daily due to intracranial bleeding. She apparently doing well clinically on Eliquis  2.5 mg twice daily. Recommend patient to continue current regimen. She has positive ANA and dsDNA, I have referred her to rheumatology

## 2024-03-14 NOTE — Progress Notes (Signed)
 Hematology/Oncology Progress note [phone] [phone]           REFERRING PROVIDER: Alexander Anes, MD   CHIEF COMPLAINTS/REASON FOR VISIT:   thrombocytopenia, antiphospholipid syndrome   ASSESSMENT & PLAN:   Thrombocytopenia (HCC) Chronic thrombocytopenia, platelet count appears at her baseline. 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  B12 deficiency Normal B12, folate, no M protein on SPEP.  Currently count is >50,000, recommend observation.  If count drops below 50,000, consider steroid treatment. .     Antiphospholipid antibody syndrome (HCC) Previously failed Coumadin.   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 Patient did not tolerate Eliquis  5 mg twice daily due to intracranial bleeding. She apparently doing well clinically on Eliquis  2.5 mg twice daily. Recommend patient to continue current regimen. She has positive ANA and dsDNA, I have referred her to rheumatology  History of DVT of lower extremity Eliquis  2.5mg  BID for anticoagulation.  B12 deficiency Recommend patient to start oral B12 1000mcg daily.     Orders Placed This Encounter  Procedures   CBC with Differential (Cancer Center Only)    Standing Status:   Future    Expected Date:   06/14/2024    Expiration Date:   03/14/2025   CMP (Cancer Center only)    Standing Status:   Future    Expected Date:   06/14/2024    Expiration Date:   03/14/2025   Retic Panel    Standing Status:   Future    Expected Date:   06/14/2024    Expiration Date:   03/14/2025   Follow up in 3 months All questions were answered. The patient knows to call the clinic with any problems, questions or concerns.  [name], MD, PhD [facility] Hematology Oncology 03/14/2024   HISTORY OF PRESENTING ILLNESS:   Alison Lopez is a  68 y.o.  female with PMH listed below was seen in  consultation at the request of  Alexander Anes, MD  for evaluation of thrombocytopenia.   Patient previously followed up by Dr. Randy Buttery and was last seen in 2021. She lost follow up. Daughter prefers patient to reestablish care this week as she has flexible schedule this week. Dr. Randy Buttery is off this week so patient presents to establish care with me.   Extensive medical records review was performed by me.  prior h/o LLE DVT back in 1993 after an episode of pneumonia. Patient was started on coumadin back then. 04/20/2019 she developed extensive right calf, femoral and popliteal DVT despite therapeutic INR of 2.2.  Anticoagulation was switched to Eliquis .  Patient was also seen by vascular surgery and underwent thrombectomy and IVC filter placement.  06/21/2019 Hypercoagulable workup showed elevated anticardiolipin IgG antibody of 74.  Beta-2  IgG glycoprotein was elevated 48.  Lupus anticoagulant testing positive.  12/15/2019 repeat set of APS labs showed elevated anticardiolipin IgG 74, positive lupus anticoagulants, beta-2  glycoprotein IgG 42. This tests confirms that she has triple positive antiphospholipid syndrome.  Patient was kept on Eliquis  for anticoagulation as she failed Coumadin.[name]  Options of switching to Lovenox  or Arixtra were discussed with patient and she declined at that time.  08/22/2019 negative JAK2 V617F mutation.  PNH profile negative  Thrombocytopenia, this is a chronic issue for her.  It was felt to be secondary to antiphospholipid syndrome,  Due to being on chronic anticoagulation with low platelet count, a course of steroid [Decadron  40 mg  daily for 4 days] was given which only mildly improved platelet count to 60,000-76,000's..   IVC filter was removed in 2023 Patient has a history of iron  deficiency anemia Colonoscopy 06/2021-exudate/mucus at appendiceal orifice. Medium sized angioectasia ascending. Diverticulosis sigmoid descending ascending. Total of 9 small polyps throughout  colon. Repeat 1 year for surveillance recommended. EGD 06/2021-grade a esophagitis. Gastritis.   Her daughter is her caregiver. She reports that patient has been experiencing memory issues for about three years, which have progressively worsened. She has been living with daughter for a couple of years, who manages her medications and daily care. Daughter is not clear if patient is currently taking Eliquis .  She will check her medication list and update me.   INTERVAL HISTORY Alison Lopez is a 68 y.o. female who has above history reviewed by me today presents for follow up visit for APS syndrome thrombocytopenia Patient restarted on Eliquis  5 mg twice daily in the middle of March 2025. Shortly after that, she developed subdural hematoma status post bilateral frontal burr hole for drainage on 01/30/2024 by Dr. Jeris Montes.  Patient was admitted to inpatient rehab service on 02/08/2024.  02/15/2024 patient was found to have increased To the mental status with cranial CT scan showing slightly increased mixed attention subdural collection over the left cerebral . convexity measuring up to 13 mm, previously 11 mm. New focus of acute blood products posteriorly over the left parietal lobe measuring up to 5 mm in thickness. Additional acute blood products of the posterior left occipital lobe measuring up to 8 mm in thickness.  Neurosurgery recommends supportive care, conservative management. Repeat scan 03/01/2024 CT head showed decreased size of subdural hematoma along left cerebral convexity, now measuring up to 7 mm in thickness.  No midline shift.  Right cerebral convexity mixed density subdural hematoma has essentially resolved.  Patient was discharged to home.   Patient was cleared to restart on Eliquis  2.5 mg twice daily on 03/02/2024..  Today patient was accompanied by daughter for posthospitalization follow-up.  She takes Eliquis  2.5 mg twice daily.  Mental status appears to be at baseline.  No new  complaints.  Per daughter, patient has upcoming follow-up with neurosurgery.   MEDICAL HISTORY:  Past Medical History:  Diagnosis Date   Anxiety    Arthritis    Collagen vascular disease (HCC)    COPD (chronic obstructive pulmonary disease) (HCC)    DDD (degenerative disc disease), lumbar    Dementia (HCC)    Depression    Diabetes mellitus without complication (HCC)    DVT (deep venous thrombosis) (HCC)    Fibromyalgia    GERD (gastroesophageal reflux disease)    Graves' disease with exophthalmos    HLD (hyperlipidemia)    Hypertension    Hypothyroidism    Lupus    Osteoporosis     SURGICAL HISTORY: Past Surgical History:  Procedure Laterality Date   ABDOMINAL HYSTERECTOMY     BURR HOLE Bilateral 01/30/2024   Procedure: CREATION, CRANIAL BURR HOLE, SUBDURAL HEMATOMA EVACUATIOIN;  Surgeon: Jodeen Munch, MD;  Location: [facility] ORS;  Service: Neurosurgery;  Laterality: Bilateral;  need ventriculostomy x 2   COLONOSCOPY WITH PROPOFOL  N/A 06/11/2021   Procedure: COLONOSCOPY WITH PROPOFOL ;  Surgeon: Shane Darling, MD;  Location: [facility] ENDOSCOPY;  Service: Endoscopy;  Laterality: N/A;  prefers afternoon Eliquis    COLONOSCOPY WITH PROPOFOL  N/A 12/19/2022   Procedure: COLONOSCOPY WITH PROPOFOL ;  Surgeon: Shane Darling, MD;  Location: [facility] ENDOSCOPY;  Service: Endoscopy;  Laterality: N/A;  ESOPHAGOGASTRODUODENOSCOPY N/A 06/11/2021   Procedure: ESOPHAGOGASTRODUODENOSCOPY (EGD);  Surgeon: Shane Darling, MD;  Location: [facility] ENDOSCOPY;  Service: Endoscopy;  Laterality: N/A;   EYE SURGERY Right    IVC FILTER REMOVAL N/A 07/21/2019   Procedure: IVC FILTER REMOVAL;  Surgeon: Celso College, MD;  Location: [facility] INVASIVE CV LAB;  Service: Cardiovascular;  Laterality: N/A;   LOWER EXTREMITY INTERVENTION Right 04/22/2019   Procedure: IVC Filter Insertion with Right Lower Extremity Venous Lysis;  Surgeon: Celso College, MD;  Location: [facility] INVASIVE CV LAB;  Service: Cardiovascular;   Laterality: Right;   OOPHORECTOMY      SOCIAL HISTORY: Social History   Socioeconomic History   Marital status: Divorced    Spouse name: Not on file   Number of children: Not on file   Years of education: Not on file   Highest education level: Not on file  Occupational History   Not on file  Tobacco Use   Smoking status: Former    Current packs/day: 0.00    Average packs/day: 1 pack/day for 17.0 years (17.0 ttl pk-yrs)    Types: Cigarettes    Start date: 50    Quit date: 2002    Years since quitting: 23.3   Smokeless tobacco: Never  Vaping Use   Vaping status: Never Used  Substance and Sexual Activity   Alcohol use: Not Currently   Drug use: Never   Sexual activity: Not Currently    Birth control/protection: None  Other Topics Concern   Not on file  Social History Narrative   Not on file   Social Drivers of Health   Financial Resource Strain: Not on file  Food Insecurity: No Food Insecurity (02/16/2024)   Hunger Vital Sign    Worried About Running Out of Food in the Last Year: Never true    Ran Out of Food in the Last Year: Never true  Transportation Needs: No Transportation Needs (02/16/2024)   PRAPARE - Administrator, Civil Service (Medical): No    Lack of Transportation (Non-Medical): No  Physical Activity: Not on file  Stress: Not on file  Social Connections: Socially Isolated (02/16/2024)   Social Connection and Isolation Panel [NHANES]    Frequency of Communication with Friends and Family: More than three times a week    Frequency of Social Gatherings with Friends and Family: More than three times a week    Attends Religious Services: Never    Database administrator or Organizations: No    Attends Banker Meetings: Never    Marital Status: Divorced  Catering manager Violence: Not At Risk (02/16/2024)   Humiliation, Afraid, Rape, and Kick questionnaire    Fear of Current or Ex-Partner: No    Emotionally Abused: No    Physically  Abused: No    Sexually Abused: No    FAMILY HISTORY: Family History  Problem Relation Age of Onset   Stomach cancer Mother    Heart attack Father    Breast cancer Neg Hx     ALLERGIES:  is allergic to morphine and codeine; montelukast ; antihistamines, diphenhydramine -type; aspirin; benadryl  [diphenhydramine  hcl]; and codeine.  MEDICATIONS:  Current Outpatient Medications  Medication Sig Dispense Refill   acetaminophen  (TYLENOL ) 325 MG tablet Take 2 tablets (650 mg total) by mouth every 6 (six) hours as needed for mild pain (pain score 1-3) (or Fever >/= 101).     ascorbic acid  (VITAMIN C ) 1000 MG tablet Take 1 tablet (1,000 mg total) by mouth  daily. 30 tablet 0   donepezil  (ARICEPT ) 10 MG tablet Take 1 tablet (10 mg total) by mouth at bedtime. 30 tablet 0   ergocalciferol  (VITAMIN D2) 1.25 MG (50000 UT) capsule Take 1 capsule (50,000 Units total) by mouth every Friday. 4 capsule 0   levothyroxine  (SYNTHROID ) 125 MCG tablet Take 1 tablet (125 mcg total) by mouth daily before breakfast. 30 tablet 0   melatonin 5 MG TABS Take 1 tablet (5 mg total) by mouth at bedtime. 30 tablet 0   methylphenidate  (RITALIN ) 10 MG tablet Take 1 tablet (10 mg total) by mouth 2 (two) times daily with breakfast and lunch. 60 tablet 0   rosuvastatin  (CRESTOR ) 20 MG tablet Take 1 tablet (20 mg total) by mouth daily. 30 tablet 0   sertraline  (ZOLOFT ) 25 MG tablet Take 1 tablet (25 mg total) by mouth daily. 30 tablet 0   apixaban  (ELIQUIS ) 2.5 MG TABS tablet Take 1 tablet (2.5 mg total) by mouth 2 (two) times daily. 60 tablet 2   cyanocobalamin  (VITAMIN B12) 1000 MCG tablet Take 1 tablet (1,000 mcg total) by mouth daily. 90 tablet 1   ferrous sulfate  325 (65 FE) MG tablet Take 1 tablet (325 mg total) by mouth daily with breakfast. 30 tablet 0   No current facility-administered medications for this visit.    Review of Systems  Unable to perform ROS: Dementia   PHYSICAL EXAMINATION: ECOG PERFORMANCE STATUS: 2  - Symptomatic, <50% confined to bed Vitals:   03/14/24 1044  BP: 119/63  Pulse: 77  Resp: 18  Temp: (!) 96.3 F (35.7 C)  SpO2: 100%   Filed Weights   03/14/24 1044  Weight: 102 lb 4.8 oz (46.4 kg)    Physical Exam Constitutional:      General: She is not in acute distress.    Comments: Patient sits in the wheelchair  HENT:     Head: Normocephalic and atraumatic.  Eyes:     General: No scleral icterus. Cardiovascular:     Rate and Rhythm: Normal rate and regular rhythm.  Pulmonary:     Effort: Pulmonary effort is normal. No respiratory distress.     Breath sounds: No wheezing.  Abdominal:     General: Bowel sounds are normal. There is no distension.     Palpations: Abdomen is soft.  Musculoskeletal:        General: No deformity. Normal range of motion.     Cervical back: Normal range of motion and neck supple.  Skin:    Findings: No erythema or rash.  Neurological:     Mental Status: She is alert. Mental status is at baseline.     Comments: Follows simple commands.  Psychiatric:        Mood and Affect: Mood normal.     LABORATORY DATA:  I have reviewed the data as listed    Latest Ref Rng & Units 03/14/2024   10:14 AM 02/26/2024    9:33 AM 02/22/2024    5:24 AM  CBC  WBC 4.0 - 10.5 K/uL 9.0  6.5  6.0   Hemoglobin 12.0 - 15.0 g/dL 16.1  09.6  9.7   Hematocrit 36.0 - 46.0 % 37.9  32.7  29.0   Platelets 150 - 400 K/uL 123  84  102       Latest Ref Rng & Units 03/14/2024   10:14 AM 02/26/2024    9:33 AM 02/24/2024    5:31 AM  CMP  Glucose 70 - 99 mg/dL 045  130  93   BUN 8 - 23 mg/dL 20  17  17    Creatinine 0.44 - 1.00 mg/dL 1.02  7.25  3.66   Sodium 135 - 145 mmol/L 138  138  138   Potassium 3.5 - 5.1 mmol/L 4.6  4.0  4.1   Chloride 98 - 111 mmol/L 105  107  111   CO2 22 - 32 mmol/L 23  20  24    Calcium  8.9 - 10.3 mg/dL 8.9  8.7  8.5   Total Protein 6.5 - 8.1 g/dL 7.0     Total Bilirubin 0.0 - 1.2 mg/dL 0.4     Alkaline Phos 38 - 126 U/L 66     AST 15 -  41 U/L 19     ALT 0 - 44 U/L 13         RADIOGRAPHIC STUDIES: I have personally reviewed the radiological images as listed and agreed with the findings in the report. CT HEAD WO CONTRAST ( ) Result Date: 03/01/2024 CLINICAL DATA:  Provided history: Subdural hematoma. EXAM: CT HEAD WITHOUT CONTRAST TECHNIQUE: Contiguous axial images were obtained from the base of the skull through the vertex without intravenous contrast. RADIATION DOSE REDUCTION: This exam was performed according to the departmental dose-optimization program which includes automated exposure control, adjustment of the mA and/or kV according to patient size and/or use of iterative reconstruction technique. COMPARISON:  Head CT 02/16/2024. FINDINGS: Brain: Generalized cerebral atrophy. The subdural hematoma demonstrated along the right cerebral convexity on the prior head CT of 02/16/2024 has essentially resolved. The mixed density subdural hematoma along the left cerebral convexity persists, but has decreased in size and now measures up to 7 mm in thickness (previously 9-10 mm). Persistent although decreased hyperdense components within this hematoma. Foci of chronic encephalomalacia/gliosis again demonstrated within the bilateral frontal, parietal and occipital lobes. Background patchy and ill-defined hypoattenuation within the cerebral white matter, nonspecific but compatible chronic small vessel ischemic disease. 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 No acute demarcated cortical infarct. No evidence of an intracranial mass. No midline shift. Vascular: No hyperdense vessel.  Atherosclerotic calcifications. Skull: No acute calvarial fracture or aggressive osseous lesion. Burr holes within the frontoparietal calvarium bilaterally. Sinuses/Orbits: No mass or acute finding within the imaged orbits. 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 No significant paranasal sinus disease at the imaged levels.  IMPRESSION: 1. A mixed density subdural hematoma along the left cerebral convexity persists, but has decreased in size since 02/16/2024, now measuring up to 7 mm in thickness. No midline shift. 2. The mixed density subdural hematoma previously demonstrated along the right cerebral convexity has essentially resolved. 3. Background parenchymal atrophy, chronic small vessel ischemic disease and chronic infarcts, as described. Electronically Signed   By: Bascom Lily D.O.   On: 03/01/2024 13:37   CT HEAD WO CONTRAST ( ) Result Date: 02/16/2024 CLINICAL DATA:  68 year old female with left subdural hematoma. EXAM: CT HEAD WITHOUT CONTRAST TECHNIQUE: Contiguous axial images were obtained from the base of the skull through the vertex without intravenous contrast. RADIATION DOSE REDUCTION: This exam was performed according to the departmental dose-optimization program which includes automated exposure control, adjustment of the mA and/or kV according to patient size and/or use of iterative reconstruction technique. COMPARISON:  Head CT yesterday and earlier. FINDINGS: Brain: Mixed density left side subdural hematoma redemonstrated. Thickness is 9-10 mm on coronal images and not changed from yesterday. Minority of hyperdense blood products not significantly changed. Trace subdural space gas also again  noted on coronal image 15. Smaller mixed density right side subdural hematoma which is primarily limited to the posterior convexity is 4-5 mm, stable on coronal image 23. No significant midline shift. No significant mass effect on the ventricles. Basilar cisterns remain patent. No ventriculomegaly. No new intracranial hemorrhage identified. Stable gray-white matter differentiation throughout the brain. Extensive chronic occipital and parietal lobe encephalomalacia, small chronic cerebellar infarcts. Vascular: Calcified atherosclerosis at the skull base. No suspicious intracranial vascular hyperdensity. Skull: Stable previous  vertex burr holes. Sinuses/Orbits: Visualized paranasal sinuses and mastoids are stable and well aerated. Other: No acute orbit or scalp soft tissue finding. IMPRESSION: 1. Bilateral, left greater than right mixed density Subdural Hematomas are stable from yesterday. Left side up to 10 mm, and smaller on the Right 4-5 mm. 2. No significant intracranial mass effect. No new intracranial abnormality. 3. Advanced Chronic ischemic disease. Electronically Signed   By: Marlise Simpers M.D.   On: 02/16/2024 13:35   CT HEAD WO CONTRAST ( ) Addendum Date: 02/15/2024 ADDENDUM REPORT: 02/15/2024 18:40 ADDENDUM: These results were called by telephone at the time of interpretation on 02/15/2024 at 6:36 pm to provider Jean Michaelis, PA, who verbally acknowledged these results. Electronically Signed   By: Denny Flack M.D.   On: 02/15/2024 18:40   Result Date: 02/15/2024 CLINICAL DATA:  Subdural hematoma, increased confusion and inattention on exam. EXAM: CT HEAD WITHOUT CONTRAST TECHNIQUE: Contiguous axial images were obtained from the base of the skull through the vertex without intravenous contrast. RADIATION DOSE REDUCTION: This exam was performed according to the departmental dose-optimization program which includes automated exposure control, adjustment of the mA and/or kV according to patient size and/or use of iterative reconstruction technique. COMPARISON:  CT head 01/31/2024 FINDINGS: Brain: Redemonstrated postsurgical changes of both subdural spaces with interval removal of surgical drains. There is a mixed attenuation subdural collection over the left cerebral convexity which measures up to 13 mm in thickness, previously measuring 11 mm. Associated mass effect on the parenchyma is similar to prior. There are additional areas of acute blood products posteriorly over the left parietal lobe measuring up to 5 mm in maximum coronal thickness. Additional blood products over the posterior left occipital lobe adjacent to the  transverse sinus measuring up to 8 mm in thickness. Minimal residual subdural collection over the right parietal lobe near the vertex. Approximately 2 mm rightward midline shift is similar to prior. Interval resolution of pneumocephalus. Similar chronic microvascular ischemic changes. 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 Small remote infarcts in the left cerebellum. Generalized parenchymal volume loss. The basilar cisterns are patent. Vascular: Atherosclerosis of the carotid siphons. No hyperdense vessel. Skull: Normal. Negative for fracture or focal lesion. Sinuses/Orbits: Orbits are symmetric. Mild mucosal thickening in the ethmoid sinuses and left sphenoid sinus. Other: Mastoid air cells are clear. IMPRESSION: Slightly increased mixed attenuation subdural collection over the left cerebral convexity measuring up to 13 mm, previously 11 mm. New focus of acute blood products posteriorly over the left parietal lobe measuring up to 5 mm in thickness. Additional acute blood products over the posterior left occipital lobe measuring up to 8 mm in thickness. Overall similar mass effect with approximately 2 mm rightward midline shift. Chronic ischemic disease as above. Electronically Signed: By: Denny Flack M.D. On: 02/15/2024 18:19   DG Chest 2 View Result Date: 02/13/2024 CLINICAL DATA:  Fever EXAM: CHEST - 2 VIEW COMPARISON:  02/12/2024 FINDINGS: No acute airspace disease. Suspect trace left pleural effusion or pleural thickening. Previously  noted vague right lung base opacity is felt secondary to artifact. Normal cardiomediastinal silhouette with aortic atherosclerosis. IMPRESSION: No focal pulmonary opacity. Suspect trace left pleural effusion or pleural thickening. Electronically Signed   By: Esmeralda Hedge M.D.   On: 02/13/2024 17:47   DG CHEST PORT 1 VIEW Result Date: 02/12/2024 CLINICAL DATA:  Fever EXAM: PORTABLE CHEST 1 VIEW COMPARISON:  CT 04/17/2021 FINDINGS: Scarring or atelectasis at the left  base. Vague right lung base opacity. Normal cardiac size with aortic atherosclerosis. No pneumothorax IMPRESSION: Vague right lung base opacity, question soft tissue artifact versus focus of airspace disease. Suggest two-view chest radiograph follow-up when clinically feasible Electronically Signed   By: Esmeralda Hedge M.D.   On: 02/12/2024 22:32   CT HEAD WO CONTRAST ( ) Result Date: 01/31/2024 CLINICAL DATA:  68 year old female with altered mental status and bilateral subdural hematomas at presentation. Postoperative now bilateral subdural evacuation. EXAM: CT HEAD WITHOUT CONTRAST TECHNIQUE: Contiguous axial images were obtained from the base of the skull through the vertex without intravenous contrast. RADIATION DOSE REDUCTION: This exam was performed according to the departmental dose-optimization program which includes automated exposure control, adjustment of the mA and/or kV according to patient size and/or use of iterative reconstruction technique. COMPARISON:  Head CT 01/29/2024. FINDINGS: Brain: Postoperative changes to both subdural spaces with bilateral subdural drains in place. Associated pneumocephalus. Decreased volume of right side subdural hematoma, residual now only 4-5 mm at most levels (previously up to 14 mm). Decreased volume of left side subdural hematoma, more mixed density now and residual up to maximum thickness of 11 mm (previously 17 mm). Subsequent decreased intracranial mass effect and improved ventricle size and configuration. Mild rightward midline shift now 1-2 mm (coronal image 35). Small volume left para falcine subdural blood and pneumocephalus also. Basilar cisterns are patent and mildly improved. No IVH or new areas of intracranial hemorrhage. Chronic parietal 0 and occipital lobe encephalomalacia more pronounced on the right. Small chronic left cerebellar infarcts. No cortically based acute infarct identified. Vascular: Calcified atherosclerosis at the skull base. No  suspicious intracranial vascular hyperdensity. Skull: New bilateral vertex burr holes. Sinuses/Orbits: Visualized paranasal sinuses and mastoids are stable and well aerated. Other: Postoperative changes now to the bilateral scalp vertex with percutaneous drains and skin staples in place. Similar leftward gaze. Postoperative changes to the right globe. IMPRESSION: 1. Postoperative substantial decrease in bilateral subdural hematomas with bilateral subdural drains in place. Residual blood up to 11 mm on the Left, 4-5 mm on the Right. 2. Significantly regressed intracranial mass effect. Residual rightward midline shift now 1-2 mm. 3. No new intracranial abnormality. Chronic ischemic disease. Electronically Signed   By: Marlise Simpers M.D.   On: 01/31/2024 05:16   CT Cervical Spine Wo Contrast Result Date: 01/29/2024 CLINICAL DATA:  Right upper and lower extremity weakness. Abrupt change. Neck trauma. EXAM: CT CERVICAL SPINE WITHOUT CONTRAST TECHNIQUE: Multidetector CT imaging of the cervical spine was performed without intravenous contrast. Multiplanar CT image reconstructions were also generated. RADIATION DOSE REDUCTION: This exam was performed according to the departmental dose-optimization program which includes automated exposure control, adjustment of the mA and/or kV according to patient size and/or use of iterative reconstruction technique. COMPARISON:  None Available. FINDINGS: Alignment: Slight degenerative anterolisthesis is present at C4-5. No other significant listhesis is present. Straightening of the normal cervical lordosis is present. Rightward curvature of the cervical spine is centered at C6. Skull base and vertebrae: The craniocervical junction is normal. Vertebral body heights are normal.  No acute fractures are present. Soft tissues and spinal canal: The soft tissues of the neck are otherwise unremarkable. Salivary glands are within normal limits. Thyroid  is normal. No significant adenopathy is  present. No focal mucosal or submucosal lesions are present. Disc levels: Chronic endplate degenerative changes are present at C5-6. Uncovertebral spurring leads to moderate left and mild right foraminal stenosis. 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 Upper chest: The lung apices are clear. The thoracic inlet is within normal limits. IMPRESSION: 1. No acute or focal lesion to explain the patient's symptoms. 2. Chronic endplate degenerative changes at C5-6 with moderate left and mild right foraminal stenosis. 3. 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 Electronically Signed   By: Audree Leas M.D.   On: 01/29/2024 15:08   CT HEAD WO CONTRAST Addendum Date: 01/29/2024 ADDENDUM REPORT: 01/29/2024 13:05 ADDENDUM: Study discussed by telephone with Dr. Mike Alcon on 01/29/2024 at 1259 hours. Electronically Signed   By: Marlise Simpers M.D.   On: 01/29/2024 13:05   Result Date: 01/29/2024 CLINICAL DATA:  68 year old female with abrupt onset altered mental status, incontinence, not moving right arm. History of dementia. EXAM: CT HEAD WITHOUT CONTRAST TECHNIQUE: Contiguous axial images were obtained from the base of the skull through the vertex without intravenous contrast. RADIATION DOSE REDUCTION: This exam was performed according to the departmental dose-optimization program which includes automated exposure control, adjustment of the mA and/or kV according to patient size and/or use of iterative reconstruction technique. COMPARISON:  Head CT 02/13/2005. FINDINGS: Brain: New since 2006 bihemispheric hypodense subdural hematomas with intracranial mass effect. Both collections are fairly uniformly hypodense throughout much of the hemisphere but there is layering hyperdense blood posteriorly on both sides (series 2, image 17). Both collections are somewhat biconvex. That on the left measures up to 17 mm in maximum thickness (coronal image 42), on the right 14 mm in maximum thickness. Questionable trace para falcine  hyperdense blood products also on the left coronal image 17. But no other convincing multi spatial hemorrhage. Subsequently there is no significant midline shift. Mass effect on the lateral ventricles but the basilar cisterns remain patent. Incidental choroid plexus cysts (normal variant). Chronic appearing encephalomalacia in both occipital poles (series 2, image 15), new since 2006. No cortically based acute infarct identified. And multiple small chronic appearing cerebellar infarcts more numerous on the left. Other patchy bilateral cerebral white matter hypodensity. Vascular: Calcified atherosclerosis at the skull base. No suspicious intracranial vascular hyperdensity. Skull: Intact.  No acute osseous abnormality identified. Sinuses/Orbits: Visualized paranasal sinuses and mastoids are stable and well aerated. Other: Visualized orbits and scalp soft tissues are within normal limits. IMPRESSION: 1. Positive for bilateral mixed density Subdural Hematomas, somewhat biconvex and with 17 mm maximum thickness on the Left and 14 mm maximum thickness on the Right. Subsequent mass effect on both hemispheres although no significant midline shift. Basilar cisterns remain patent. 2. No skull fracture or other acute intracranial abnormality identified. 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 Electronically Signed: By: Marlise Simpers M.D. On: 01/29/2024 12:57

## 2024-03-14 NOTE — Progress Notes (Unsigned)
 Subjective:    Patient ID: Alison Lopez, female    DOB: [birth year], 68 y.o.   MRN: [id number]  HPI: Alison Lopez is a 68 y.o. female who is here for HFU appointment for F/U of her Subdural Hematoma and Hyperlipidemia. She presented to [facility] on 01/29/2024 with altered mental status, right sided weakness, urinary and fecal incontinence.  Dr. Peggi Bowels: 03/21/205 H&P: Alison Lopez is a 68 y.o. female  \with history of DVT on Eliquis , chronic thrombocytopenia, B12 deficiency who comes in with concerns for stroke.  To note patient was seen by oncology on 01/26/2024.  Patient had been off her Eliquis  for some time but it was thought that patient probably has antiphospholipid antibody syndrome and they had recommended that she resume her Eliquis .   According to daughter who lives with her she was last seen normal on Wednesday when going to bed.  She did not see her on Thursday until around 5 PM where she was noted to have right sided weakness and was not willing to use her right arm to feed herself.  She also had urinated on herself and defecated on herself during the nighttime which is very abnormal for her.  She does report that there is a lot of stress right now in the house and that the son who lives somewhere else had come to visit and had showed up drunk last night.  She states that the son was with her during the day Thursday but is unclear if she was ever normal during this time.  She does not have a good relationship with him to be able to ask.   Since coming to the emergency room her symptoms have actually significantly resolved.  She is not able to move the right side of her body.   CT Head: WO Contrast: IMPRESSION: 1. Positive for bilateral mixed density Subdural Hematomas, somewhat biconvex and with 17 mm maximum thickness on the Left and 14 mm maximum thickness on the Right. Subsequent mass effect on both hemispheres although no significant midline shift. Basilar  cisterns remain patent.   2. No skull fracture or other acute intracranial abnormality identified. 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  CT Cervical Spine IMPRESSION: 1. No acute or focal lesion to explain the patient's symptoms. 2. Chronic endplate degenerative changes at C5-6 with moderate left and mild right foraminal stenosis. 3. 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  CT Head WO Contrast:  IMPRESSION: 1. Postoperative substantial decrease in bilateral subdural hematomas with bilateral subdural drains in place. Residual blood up to 11 mm on the Left, 4-5 mm on the Right. 2. Significantly regressed intracranial mass effect. Residual rightward midline shift now 1-2 mm. 3. No new intracranial abnormality. Chronic ischemic disease.   On 01/30/2024, she underwent: Dr Sharon December CREATION, CRANIAL BURR HOLE, SUBDURAL HEMATOMA EVACUATIOIN   Alison Lopez was admitted to inpatient Rehabilitation on 02/19/2024 and discharged home on 03/04/2024. She is receiving Home Health Therapy with [facility]. . She denies any pain at this time. She rated her pain 5, on Health and History Form.   Daughter in room all questions answered.    Pain Inventory Average Pain 5 Pain Right Now 5 My pain is intermittent  LOCATION OF PAIN  head, back, hip  BOWEL Number of stools per week: 3 Oral laxative use No  Type of laxative . Enema or suppository use No  History of colostomy No  Incontinent No   BLADDER  Pads In and out cath, frequency . Able to self cath  . Bladder incontinence No  Frequent urination No  Leakage with coughing No  Difficulty starting stream No  Incomplete bladder emptying No    Mobility walk with assistance how many minutes can you walk? 10-15 ability to climb steps?  no do you drive?  no use a wheelchair needs help with transfers  Function disabled: date disabled . I need assistance with the following:   feeding, dressing, bathing, toileting, meal prep, household duties, and shopping  Neuro/Psych bladder control problems bowel control problems weakness trouble walking confusion  Prior Studies TC appt  Physicians involved in your care TC appt   Family History  Problem Relation Age of Onset   Stomach cancer Mother    Heart attack Father    Breast cancer Neg Hx    Social History   Socioeconomic History   Marital status: Divorced    Spouse name: Not on file   Number of children: Not on file   Years of education: Not on file   Highest education level: Not on file  Occupational History   Not on file  Tobacco Use   Smoking status: Former    Current packs/day: 0.00    Average packs/day: 1 pack/day for 17.0 years (17.0 ttl pk-yrs)    Types: Cigarettes    Start date: 46    Quit date: 2002    Years since quitting: 23.3   Smokeless tobacco: Never  Vaping Use   Vaping status: Never Used  Substance and Sexual Activity   Alcohol use: Not Currently   Drug use: Never   Sexual activity: Not Currently    Birth control/protection: None  Other Topics Concern   Not on file  Social History Narrative   Not on file   Social Drivers of Health   Financial Resource Strain: Not on file  Food Insecurity: No Food Insecurity (02/16/2024)   Hunger Vital Sign    Worried About Running Out of Food in the Last Year: Never true    Ran Out of Food in the Last Year: Never true  Transportation Needs: No Transportation Needs (02/16/2024)   PRAPARE - Administrator, Civil Service (Medical): No    Lack of Transportation (Non-Medical): No  Physical Activity: Not on file  Stress: Not on file  Social Connections: Socially Isolated (02/16/2024)   Social Connection and Isolation Panel [NHANES]    Frequency of Communication with Friends and Family: More than three times a week    Frequency of Social Gatherings with Friends and Family: More than three times a week    Attends Religious  Services: Never    Database administrator or Organizations: No    Attends Engineer, structural: Never    Marital Status: Divorced   Past Surgical History:  Procedure Laterality Date   ABDOMINAL HYSTERECTOMY     BURR HOLE Bilateral 01/30/2024   Procedure: CREATION, CRANIAL BURR HOLE, SUBDURAL HEMATOMA EVACUATIOIN;  Surgeon: Jodeen Munch, MD;  Location: [facility] ORS;  Service: Neurosurgery;  Laterality: Bilateral;  need ventriculostomy x 2   COLONOSCOPY WITH PROPOFOL  N/A 06/11/2021   Procedure: COLONOSCOPY WITH PROPOFOL ;  Surgeon: Shane Darling, MD;  Location: [facility] ENDOSCOPY;  Service: Endoscopy;  Laterality: N/A;  prefers afternoon Eliquis    COLONOSCOPY WITH PROPOFOL  N/A 12/19/2022   Procedure: COLONOSCOPY WITH PROPOFOL ;  Surgeon: Shane Darling, MD;  Location: [facility] ENDOSCOPY;  Service: Endoscopy;  Laterality: N/A;   ESOPHAGOGASTRODUODENOSCOPY N/A 06/11/2021  Procedure: ESOPHAGOGASTRODUODENOSCOPY (EGD);  Surgeon: Shane Darling, MD;  Location: [facility] ENDOSCOPY;  Service: Endoscopy;  Laterality: N/A;   EYE SURGERY Right    IVC FILTER REMOVAL N/A 07/21/2019   Procedure: IVC FILTER REMOVAL;  Surgeon: Celso College, MD;  Location: [facility] INVASIVE CV LAB;  Service: Cardiovascular;  Laterality: N/A;   LOWER EXTREMITY INTERVENTION Right 04/22/2019   Procedure: IVC Filter Insertion with Right Lower Extremity Venous Lysis;  Surgeon: Celso College, MD;  Location: [facility] INVASIVE CV LAB;  Service: Cardiovascular;  Laterality: Right;   OOPHORECTOMY     Past Medical History:  Diagnosis Date   Anxiety    Arthritis    Collagen vascular disease (HCC)    COPD (chronic obstructive pulmonary disease) (HCC)    DDD (degenerative disc disease), lumbar    Dementia (HCC)    Depression    Diabetes mellitus without complication (HCC)    DVT (deep venous thrombosis) (HCC)    Fibromyalgia    GERD (gastroesophageal reflux disease)    Graves' disease with exophthalmos    HLD (hyperlipidemia)     Hypertension    Hypothyroidism    Lupus    Osteoporosis    There were no vitals taken for this visit.  Opioid Risk Score:   Fall Risk Score:  `1  Depression screen [facility] 2/9     01/21/2024    9:51 AM  Depression screen PHQ 2/9  Decreased Interest 0  Down, Depressed, Hopeless 0  PHQ - 2 Score 0    Review of Systems  Musculoskeletal:  Positive for back pain and gait problem.       Hip pain  All other systems reviewed and are negative.     Objective:   Physical Exam Vitals and nursing note reviewed.  Constitutional:      Appearance: Normal appearance.  Cardiovascular:     Rate and Rhythm: Normal rate and regular rhythm.     Pulses: Normal pulses.     Heart sounds: Normal heart sounds.  Pulmonary:     Effort: Pulmonary effort is normal.     Breath sounds: Normal breath sounds.  Musculoskeletal:     Comments: Normal Muscle Bulk and Muscle Testing Reveals:  Upper Extremities: Full ROM and Muscle Strength on Right 3/5 and Left 5/5  Lumbar Paraspinal Tenderness: L-3-L-5  Lower Extremities: Full ROM and Muscle Strength 5/5 Arrived in wheelchair.     Skin:    General: Skin is warm and dry.  Neurological:     Mental Status: She is alert and oriented to person, place, and time.  Psychiatric:        Mood and Affect: Mood normal.        Behavior: Behavior normal.         Assessment & Plan:  Subdural Hematoma : S/P  On 01/30/2024, she underwent: Dr Sharon December CREATION, CRANIAL BURR HOLE, SUBDURAL HEMATOMA EVACUATIOIN.  Continue Home Health Therapy with [facility]. He has a scheduled appointment with Dr Mont Antis.Continue Eliquis ,  Dr Wilhelmenia Harada following, Continue to monitor.  2. Hyperlipidemia. Continue current medication regimen: Daughter instructed to call to schedule PCP appointment. She verbalizes understanding.  3. Antiphospholipid antibody syndrome: Continue current medication regimen. She has a scheduked appointment with Dr. Wilhelmenia Harada.  4. Mentation: Continue Ritalin . Continue to  Monitor.  F/U with Dr Dorn Gaskins in 4-6 weeks.

## 2024-03-14 NOTE — Assessment & Plan Note (Signed)
 Eliquis  2.5mg  BID for anticoagulation.

## 2024-03-14 NOTE — Assessment & Plan Note (Signed)
 Recommend patient to start oral B12 daily.

## 2024-03-14 NOTE — Assessment & Plan Note (Signed)
 Chronic thrombocytopenia, platelet count appears at her baseline. 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  B12 deficiency Normal B12, folate, no M protein on SPEP.  Currently count is >50,000, recommend observation.  If count drops below 50,000, consider steroid treatment. Marland Kitchen

## 2024-03-15 ENCOUNTER — Encounter: Attending: Registered Nurse | Admitting: Registered Nurse

## 2024-03-15 ENCOUNTER — Encounter: Payer: Self-pay | Admitting: Registered Nurse

## 2024-03-15 VITALS — BP 135/85 | HR 68 | Ht <= 58 in | Wt 104.0 lb

## 2024-03-15 DIAGNOSIS — E7849 Other hyperlipidemia: Secondary | ICD-10-CM | POA: Insufficient documentation

## 2024-03-15 DIAGNOSIS — S065XAA Traumatic subdural hemorrhage with loss of consciousness status unknown, initial encounter: Secondary | ICD-10-CM | POA: Diagnosis not present

## 2024-03-15 DIAGNOSIS — D6861 Antiphospholipid syndrome: Secondary | ICD-10-CM | POA: Insufficient documentation

## 2024-03-15 MED ORDER — METHYLPHENIDATE HCL 10 MG PO TABS: 10.0000 mg | ORAL_TABLET | Freq: Two times a day (BID) | ORAL | 0 refills | Status: DC

## 2024-03-22 ENCOUNTER — Encounter: Admitting: Neurosurgery

## 2024-03-23 NOTE — Progress Notes (Unsigned)
 REFERRING PHYSICIAN:  Alexander Anes, Md [address] [place],  [place] [address]  DOS: 01/30/24  Bilateral frontal burr hole for drainage of subdural hematoma   HISTORY OF PRESENT ILLNESS:  She was discharged from [facility] on 02/08/24 an admitted to inpatient rehab.   She was sent back to [facility] on 02/16/24 due to change in head CT. Dr. Mont Antis recommended repeat scan on 02/16/24 and this was stable.   She was sent back to inpatient rehab on 02/20/24. Had CT on 03/01/24 that showed decrease in size of SDH. She was discharged home on 03/04/24.   She is getting better and only occasionally has a headache. Daughter states she is back to normal. She has some blurry vision but has chronic vision issues. Nothing new. No dizziness.   She is working with HHPT.    PHYSICAL EXAMINATION:  NEUROLOGICAL:  General: In no acute distress.   Awake, alert, oriented to person, place, and time.  Pupils equal round and reactive to light.  Facial tone is symmetric.    Extraocular muscles are intact  Facial sensation is intact bilaterally  Facial strength is intact bilaterally   Strength: Side Biceps Triceps Deltoid Interossei Grip Wrist Ext. Wrist Flex.  R 5 5 5 5 5 5 5   L 5 5 5 5 5 5 5    Side Iliopsoas Quads Hamstring PF DF EHL  R 5 5 5 5 5 5   L 5 5 5 5 5 5    Reflexes are 2+ and symmetric at the biceps, brachioradialis, patella and achilles.   Hoffman's is absent.  Clonus is not present.   Bilateral upper and lower extremity sensation is intact to light touch.     She is in a WC.   Burr holes are healing well.    Imaging:  CT head dated 03/01/24:  FINDINGS: Brain:   Generalized cerebral atrophy.   The subdural hematoma demonstrated along the right cerebral convexity on the prior head CT of 02/16/2024 has essentially resolved.   The mixed density subdural hematoma along the left cerebral convexity persists, but has decreased in size and now measures up to 7 mm in thickness  (previously 9-10 mm). Persistent although decreased hyperdense components within this hematoma.   Foci of chronic encephalomalacia/gliosis again demonstrated within the bilateral frontal, parietal and occipital lobes.   Background patchy and ill-defined hypoattenuation within the cerebral white matter, nonspecific but compatible chronic small vessel ischemic disease.   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   No acute demarcated cortical infarct.   No evidence of an intracranial mass.   No midline shift.   Vascular: No hyperdense vessel.  Atherosclerotic calcifications.   Skull: No acute calvarial fracture or aggressive osseous lesion. Burr holes within the frontoparietal calvarium bilaterally.   Sinuses/Orbits: No mass or acute finding within the imaged orbits. 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 No significant paranasal sinus disease at the imaged levels.   IMPRESSION: 1. A mixed density subdural hematoma along the left cerebral convexity persists, but has decreased in size since 02/16/2024, now measuring up to 7 mm in thickness. No midline shift. 2. The mixed density subdural hematoma previously demonstrated along the right cerebral convexity has essentially resolved. 3. Background parenchymal atrophy, chronic small vessel ischemic disease and chronic infarcts, as described.     Electronically Signed   By: Bascom Lily D.O.   On: 03/01/2024 13:37   I have personally reviewed the images and agree with the above  interpretation.  Above CT reviewed with Dr. Mont Antis prior to her visit.   Assessment / Plan: SRUTHI UMBLE is doing well s/p above surgery. She is back at home and continues wto improve. Treatment options reviewed with patient and following plan made with Dr. Mont Antis:   - No further imaging needed.  - Continue to work with HHPT.  - Follow up prn.   Advised to contact the office if any questions or concerns  arise.   Lucetta Russel PA-C Dept of Neurosurgery

## 2024-03-24 ENCOUNTER — Encounter: Payer: Self-pay | Admitting: Orthopedic Surgery

## 2024-03-24 ENCOUNTER — Ambulatory Visit (INDEPENDENT_AMBULATORY_CARE_PROVIDER_SITE_OTHER): Admitting: Orthopedic Surgery

## 2024-03-24 VITALS — BP 114/72 | Temp 98.5°F | Ht <= 58 in | Wt 104.0 lb

## 2024-03-24 DIAGNOSIS — S065XAA Traumatic subdural hemorrhage with loss of consciousness status unknown, initial encounter: Secondary | ICD-10-CM

## 2024-03-24 DIAGNOSIS — Z09 Encounter for follow-up examination after completed treatment for conditions other than malignant neoplasm: Secondary | ICD-10-CM

## 2024-03-24 DIAGNOSIS — S065XAD Traumatic subdural hemorrhage with loss of consciousness status unknown, subsequent encounter: Secondary | ICD-10-CM

## 2024-03-28 ENCOUNTER — Inpatient Hospital Stay
Admission: EM | Admit: 2024-03-28 | Discharge: 2024-03-30 | DRG: 056 | Disposition: A | Discharge status: Other Acute Inpatient Hospital | Attending: Internal Medicine | Admitting: Internal Medicine

## 2024-03-28 ENCOUNTER — Emergency Department

## 2024-03-28 ENCOUNTER — Other Ambulatory Visit: Payer: Self-pay

## 2024-03-28 DIAGNOSIS — D6861 Antiphospholipid syndrome: Secondary | ICD-10-CM | POA: Diagnosis present

## 2024-03-28 DIAGNOSIS — N390 Urinary tract infection, site not specified: Secondary | ICD-10-CM | POA: Diagnosis present

## 2024-03-28 DIAGNOSIS — Z87891 Personal history of nicotine dependence: Secondary | ICD-10-CM

## 2024-03-28 DIAGNOSIS — I10 Essential (primary) hypertension: Secondary | ICD-10-CM | POA: Diagnosis present

## 2024-03-28 DIAGNOSIS — E039 Hypothyroidism, unspecified: Secondary | ICD-10-CM | POA: Diagnosis present

## 2024-03-28 DIAGNOSIS — R258 Other abnormal involuntary movements: Principal | ICD-10-CM

## 2024-03-28 DIAGNOSIS — E785 Hyperlipidemia, unspecified: Secondary | ICD-10-CM | POA: Diagnosis present

## 2024-03-28 DIAGNOSIS — A4150 Gram-negative sepsis, unspecified: Principal | ICD-10-CM

## 2024-03-28 DIAGNOSIS — E119 Type 2 diabetes mellitus without complications: Secondary | ICD-10-CM | POA: Diagnosis present

## 2024-03-28 DIAGNOSIS — R509 Fever, unspecified: Secondary | ICD-10-CM

## 2024-03-28 DIAGNOSIS — I69398 Other sequelae of cerebral infarction: Secondary | ICD-10-CM | POA: Diagnosis not present

## 2024-03-28 DIAGNOSIS — Z79890 Hormone replacement therapy (postmenopausal): Secondary | ICD-10-CM

## 2024-03-28 DIAGNOSIS — I952 Hypotension due to drugs: Secondary | ICD-10-CM | POA: Diagnosis present

## 2024-03-28 DIAGNOSIS — R197 Diarrhea, unspecified: Secondary | ICD-10-CM | POA: Diagnosis present

## 2024-03-28 DIAGNOSIS — J449 Chronic obstructive pulmonary disease, unspecified: Secondary | ICD-10-CM | POA: Diagnosis present

## 2024-03-28 DIAGNOSIS — I69151 Hemiplegia and hemiparesis following nontraumatic intracerebral hemorrhage affecting right dominant side: Secondary | ICD-10-CM

## 2024-03-28 DIAGNOSIS — T424X5A Adverse effect of benzodiazepines, initial encounter: Secondary | ICD-10-CM | POA: Diagnosis present

## 2024-03-28 DIAGNOSIS — T426X5A Adverse effect of other antiepileptic and sedative-hypnotic drugs, initial encounter: Secondary | ICD-10-CM | POA: Diagnosis present

## 2024-03-28 DIAGNOSIS — D696 Thrombocytopenia, unspecified: Secondary | ICD-10-CM | POA: Diagnosis present

## 2024-03-28 DIAGNOSIS — F0390 Unspecified dementia without behavioral disturbance: Secondary | ICD-10-CM | POA: Diagnosis present

## 2024-03-28 DIAGNOSIS — G40209 Localization-related (focal) (partial) symptomatic epilepsy and epileptic syndromes with complex partial seizures, not intractable, without status epilepticus: Secondary | ICD-10-CM | POA: Diagnosis present

## 2024-03-28 DIAGNOSIS — R569 Unspecified convulsions: Secondary | ICD-10-CM

## 2024-03-28 DIAGNOSIS — R319 Hematuria, unspecified: Secondary | ICD-10-CM | POA: Diagnosis present

## 2024-03-28 DIAGNOSIS — E876 Hypokalemia: Secondary | ICD-10-CM | POA: Diagnosis present

## 2024-03-28 DIAGNOSIS — Z7901 Long term (current) use of anticoagulants: Secondary | ICD-10-CM

## 2024-03-28 LAB — COMPREHENSIVE METABOLIC PANEL WITH GFR
ALT: 10 U/L (ref 0–44)
AST: 19 U/L (ref 15–41)
Albumin: 3 g/dL — ABNORMAL LOW (ref 3.5–5.0)
Alkaline Phosphatase: 54 U/L (ref 38–126)
Anion gap: 9 (ref 5–15)
BUN: 15 mg/dL (ref 8–23)
CO2: 22 mmol/L (ref 22–32)
Calcium: 8.4 mg/dL — ABNORMAL LOW (ref 8.9–10.3)
Chloride: 104 mmol/L (ref 98–111)
Creatinine, Ser: 0.69 mg/dL (ref 0.44–1.00)
GFR, Estimated: >60 mL/min (ref >60)
Glucose, Bld: 112 mg/dL — ABNORMAL HIGH (ref 70–99)
Potassium: 2.8 mmol/L — ABNORMAL LOW (ref 3.5–5.1)
Sodium: 135 mmol/L (ref 135–145)
Total Bilirubin: 0.7 mg/dL (ref 0.0–1.2)
Total Protein: 6.2 g/dL — ABNORMAL LOW (ref 6.5–8.1)

## 2024-03-28 LAB — CBC WITH DIFFERENTIAL/PLATELET
Abs Immature Granulocytes: 0.02 10*3/uL (ref 0.00–0.07)
Basophils Absolute: 0.1 10*3/uL (ref 0.0–0.1)
Basophils Relative: 1 %
Eosinophils Absolute: 0.2 10*3/uL (ref 0.0–0.5)
Eosinophils Relative: 2 %
HCT: 33.5 % — ABNORMAL LOW (ref 36.0–46.0)
Hemoglobin: 11.4 g/dL — ABNORMAL LOW (ref 12.0–15.0)
Immature Granulocytes: 0 %
Lymphocytes Relative: 15 %
Lymphs Abs: 1.1 10*3/uL (ref 0.7–4.0)
MCH: 28.5 pg (ref 26.0–34.0)
MCHC: 34 g/dL (ref 30.0–36.0)
MCV: 83.8 fL (ref 80.0–100.0)
Monocytes Absolute: 0.3 10*3/uL (ref 0.1–1.0)
Monocytes Relative: 5 %
Neutro Abs: 5.8 10*3/uL (ref 1.7–7.7)
Neutrophils Relative %: 77 %
Platelets: 59 10*3/uL — ABNORMAL LOW (ref 150–400)
RBC: 4 MIL/uL (ref 3.87–5.11)
RDW: 13.9 % (ref 11.5–15.5)
WBC: 7.6 10*3/uL (ref 4.0–10.5)
nRBC: 0 % (ref 0.0–0.2)

## 2024-03-28 LAB — LACTIC ACID, PLASMA: Lactic Acid, Venous: 1.3 mmol/L (ref 0.5–1.9)

## 2024-03-28 LAB — PROTIME-INR
INR: 1.3 — ABNORMAL HIGH (ref 0.8–1.2)
Prothrombin Time: 16.7 s — ABNORMAL HIGH (ref 11.4–15.2)

## 2024-03-28 MED ORDER — LACTATED RINGERS IV BOLUS (SEPSIS)
1000.0000 mL | Freq: Once | INTRAVENOUS | Status: AC
  Administered 2024-03-28: 1000 mL via INTRAVENOUS

## 2024-03-28 MED ORDER — SODIUM CHLORIDE 0.9 % IV SOLN
2.0000 g | Freq: Once | INTRAVENOUS | Status: AC
  Administered 2024-03-28: 2 g via INTRAVENOUS
  Filled 2024-03-28: qty 20

## 2024-03-28 MED ORDER — POTASSIUM CHLORIDE CRYS ER 20 MEQ PO TBCR: 40.0000 meq | EXTENDED_RELEASE_TABLET | Freq: Once | ORAL | Status: DC

## 2024-03-28 MED ORDER — LACTATED RINGERS IV SOLN: INTRAVENOUS | Status: DC

## 2024-03-28 MED ORDER — ACETAMINOPHEN 325 MG PO TABS
650.0000 mg | ORAL_TABLET | Freq: Once | ORAL | Status: AC
  Administered 2024-03-28: 650 mg via ORAL
  Filled 2024-03-28: qty 2

## 2024-03-28 NOTE — ED Notes (Signed)
 Lab called to collect blood cultures - pt put in que.

## 2024-03-28 NOTE — ED Notes (Signed)
 Neuro consult on tele neuro at this time.

## 2024-03-28 NOTE — ED Notes (Signed)
Pt at imaging.

## 2024-03-28 NOTE — Sepsis Progress Note (Signed)
 Elink following for sepsis protocol.

## 2024-03-28 NOTE — ED Notes (Signed)
PT passed swallow screen.

## 2024-03-28 NOTE — ED Notes (Signed)
 Unable to stick pt for blood cultures. Lab Called. Will start fluids on IV that EMS started.

## 2024-03-28 NOTE — ED Notes (Signed)
 Purewick placed to pt can provide urine sample. Daughter at bedside.

## 2024-03-28 NOTE — ED Triage Notes (Addendum)
 Pt to Ed via [facility] from home for right sided tremorts. PT had a stroke in the passed with right sided defecits, but the tremors are new as of 1300 today.   Pt was given 2mg  of versed  via IV.  Vitals for EMS:  BP: 145/87 HR: 78 O2: 99 2L [place] CBG: 129

## 2024-03-28 NOTE — ED Provider Notes (Signed)
 [facility] Provider Note   Event Date/Time   First MD Initiated Contact with Patient 03/28/24 2147     (approximate) History  Tremors  HPI Alison Lopez is a 68 y.o. female with a past medical history of subdural hematoma 2 months prior to arrival, type 2 diabetes, COPD, fibromyalgia, and hypertension who presents with her primary caregiver concern for fever and new onset tremor in the right side.  Family states that patient has had right-sided deficits since the subdural however she has never had anything like this shaking activity that began at approximately 1400 today.  Patient has not been complaining of any fever, cough, chest pain, or dysuria but patient's caregiver took her temperature tonight and found it to be 100.1. ROS: Patient currently denies any vision changes, tinnitus, difficulty speaking, facial droop, sore throat, chest pain, shortness of breath, abdominal pain, nausea/vomiting/diarrhea, dysuria  Physical Exam  Triage Vital Signs: ED Triage Vitals  Encounter Vitals Group     BP 03/28/24 2126 130/76     Systolic BP Percentile --      Diastolic BP Percentile --      Pulse Rate 03/28/24 2126 79     Resp 03/28/24 2126 19     Temp 03/28/24 2126 98.2 F (36.8 C)     Temp Source 03/28/24 2126 Oral     SpO2 03/28/24 2126 97 %     Weight 03/28/24 2130 102 lb 15.3 oz (46.7 kg)     Height --      Head Circumference --      Peak Flow --      Pain Score 03/28/24 2129 0     Pain Loc --      Pain Education --      Exclude from Growth Chart --    Most recent vital signs: Vitals:   03/28/24 2126 03/28/24 2223  BP: 130/76   Pulse: 79   Resp: 19   Temp: 98.2 F (36.8 C) (!) 100.7 F (38.2 C)  SpO2: 97%    General: Awake, oriented x4. CV:  Good peripheral perfusion.  Resp:  Normal effort.  Abd:  No distention.  Other:  Elderly well-developed, well-nourished Caucasian female resting comfortably in no acute distress.  Clonus to right face,  right shoulder, and right upper extremity ED Results / Procedures / Treatments  Labs (all labs ordered are listed, but only abnormal results are displayed) Labs Reviewed  RESP PANEL BY RT-PCR (RSV, FLU A&B, COVID)  [id number]  CULTURE, BLOOD (ROUTINE X 2)  CULTURE, BLOOD (ROUTINE X 2)  LACTIC ACID, PLASMA  LACTIC ACID, PLASMA  COMPREHENSIVE METABOLIC PANEL WITH GFR  CBC WITH DIFFERENTIAL/PLATELET  PROTIME-INR  URINALYSIS, W/ REFLEX TO CULTURE (INFECTION SUSPECTED)   EKG ED ECG REPORT I, Charleen Conn, the attending physician, personally viewed and interpreted this ECG. Date: 03/28/2024 EKG Time: 2125 Rate: 76 Rhythm: normal sinus rhythm QRS Axis: normal Intervals: normal ST/T Wave abnormalities: normal Narrative Interpretation: no evidence of acute ischemia RADIOLOGY ED MD interpretation: Pending -Agree with radiology assessment Official radiology report(s): No results found. PROCEDURES: Critical Care performed: No Procedures MEDICATIONS ORDERED IN ED: Medications  lactated ringers  infusion (has no administration in time range)  lactated ringers  bolus 1,000 mL (has no administration in time range)  cefTRIAXone  (ROCEPHIN ) 2 g in sodium chloride  0.9 % 100 mL IVPB (has no administration in time range)  acetaminophen  (TYLENOL ) tablet 650 mg (has no administration in time range)   IMPRESSION / MDM /  ASSESSMENT AND PLAN / ED COURSE  I reviewed the triage vital signs and the nursing notes.                             The patient is on the cardiac monitor to evaluate for evidence of arrhythmia and/or significant heart rate changes. Patient's presentation is most consistent with acute presentation with potential threat to life or bodily function. Patient presents after recent tremor episode.  No immunosuppresion hx and had no preceding fever. No history of alcohol abuse or suspicion for toxin ingestion. Unlikely stroke, syncope. Unlikely infectious etiology. No preceding  trauma.  Workup: EKG, BMP, POCT glucose (pregnancy test if female) and CT Brain.  Field Interventions: 2 mg Versed  ED Interventions: None Disposition: Discharge home with primary care follow up in next 24-48 hours.   FINAL CLINICAL IMPRESSION(S) / ED DIAGNOSES   Final diagnoses:  Clonus   Rx / DC Orders   ED Discharge Orders     None      Note:  This document was prepared using Dragon voice recognition software and may include unintentional dictation errors.   Charleen Conn, MD 03/28/24 [phone]

## 2024-03-29 ENCOUNTER — Inpatient Hospital Stay

## 2024-03-29 DIAGNOSIS — I10 Essential (primary) hypertension: Secondary | ICD-10-CM | POA: Diagnosis present

## 2024-03-29 DIAGNOSIS — I952 Hypotension due to drugs: Secondary | ICD-10-CM | POA: Diagnosis present

## 2024-03-29 DIAGNOSIS — F0390 Unspecified dementia without behavioral disturbance: Secondary | ICD-10-CM | POA: Diagnosis present

## 2024-03-29 DIAGNOSIS — N3000 Acute cystitis without hematuria: Secondary | ICD-10-CM | POA: Diagnosis not present

## 2024-03-29 DIAGNOSIS — A419 Sepsis, unspecified organism: Secondary | ICD-10-CM | POA: Diagnosis not present

## 2024-03-29 DIAGNOSIS — D696 Thrombocytopenia, unspecified: Secondary | ICD-10-CM | POA: Diagnosis present

## 2024-03-29 DIAGNOSIS — J449 Chronic obstructive pulmonary disease, unspecified: Secondary | ICD-10-CM | POA: Diagnosis present

## 2024-03-29 DIAGNOSIS — Z87891 Personal history of nicotine dependence: Secondary | ICD-10-CM | POA: Diagnosis not present

## 2024-03-29 DIAGNOSIS — N390 Urinary tract infection, site not specified: Secondary | ICD-10-CM | POA: Diagnosis present

## 2024-03-29 DIAGNOSIS — Z515 Encounter for palliative care: Secondary | ICD-10-CM | POA: Diagnosis not present

## 2024-03-29 DIAGNOSIS — Z7401 Bed confinement status: Secondary | ICD-10-CM | POA: Diagnosis not present

## 2024-03-29 DIAGNOSIS — E119 Type 2 diabetes mellitus without complications: Secondary | ICD-10-CM | POA: Diagnosis present

## 2024-03-29 DIAGNOSIS — G40209 Localization-related (focal) (partial) symptomatic epilepsy and epileptic syndromes with complex partial seizures, not intractable, without status epilepticus: Secondary | ICD-10-CM | POA: Diagnosis present

## 2024-03-29 DIAGNOSIS — R579 Shock, unspecified: Secondary | ICD-10-CM | POA: Diagnosis not present

## 2024-03-29 DIAGNOSIS — I69151 Hemiplegia and hemiparesis following nontraumatic intracerebral hemorrhage affecting right dominant side: Secondary | ICD-10-CM | POA: Diagnosis not present

## 2024-03-29 DIAGNOSIS — G9340 Encephalopathy, unspecified: Secondary | ICD-10-CM | POA: Diagnosis not present

## 2024-03-29 DIAGNOSIS — R531 Weakness: Secondary | ICD-10-CM | POA: Diagnosis not present

## 2024-03-29 DIAGNOSIS — Z79890 Hormone replacement therapy (postmenopausal): Secondary | ICD-10-CM

## 2024-03-29 DIAGNOSIS — R197 Diarrhea, unspecified: Secondary | ICD-10-CM | POA: Diagnosis present

## 2024-03-29 DIAGNOSIS — E785 Hyperlipidemia, unspecified: Secondary | ICD-10-CM | POA: Diagnosis present

## 2024-03-29 DIAGNOSIS — Z7189 Other specified counseling: Secondary | ICD-10-CM | POA: Diagnosis not present

## 2024-03-29 DIAGNOSIS — R569 Unspecified convulsions: Secondary | ICD-10-CM | POA: Diagnosis not present

## 2024-03-29 DIAGNOSIS — E039 Hypothyroidism, unspecified: Secondary | ICD-10-CM | POA: Diagnosis present

## 2024-03-29 DIAGNOSIS — T426X5A Adverse effect of other antiepileptic and sedative-hypnotic drugs, initial encounter: Secondary | ICD-10-CM | POA: Diagnosis present

## 2024-03-29 DIAGNOSIS — I6389 Other cerebral infarction: Secondary | ICD-10-CM | POA: Diagnosis not present

## 2024-03-29 DIAGNOSIS — E876 Hypokalemia: Secondary | ICD-10-CM | POA: Diagnosis present

## 2024-03-29 DIAGNOSIS — D6861 Antiphospholipid syndrome: Secondary | ICD-10-CM | POA: Diagnosis present

## 2024-03-29 DIAGNOSIS — I959 Hypotension, unspecified: Secondary | ICD-10-CM | POA: Diagnosis not present

## 2024-03-29 DIAGNOSIS — A4150 Gram-negative sepsis, unspecified: Secondary | ICD-10-CM | POA: Diagnosis present

## 2024-03-29 DIAGNOSIS — I69398 Other sequelae of cerebral infarction: Secondary | ICD-10-CM | POA: Diagnosis not present

## 2024-03-29 DIAGNOSIS — K140 Glossitis: Secondary | ICD-10-CM | POA: Diagnosis not present

## 2024-03-29 DIAGNOSIS — A4151 Sepsis due to Escherichia coli [E. coli]: Secondary | ICD-10-CM | POA: Diagnosis not present

## 2024-03-29 DIAGNOSIS — R258 Other abnormal involuntary movements: Secondary | ICD-10-CM | POA: Diagnosis not present

## 2024-03-29 DIAGNOSIS — R319 Hematuria, unspecified: Secondary | ICD-10-CM | POA: Diagnosis present

## 2024-03-29 DIAGNOSIS — T424X5A Adverse effect of benzodiazepines, initial encounter: Secondary | ICD-10-CM | POA: Diagnosis present

## 2024-03-29 DIAGNOSIS — I6200 Nontraumatic subdural hemorrhage, unspecified: Secondary | ICD-10-CM | POA: Diagnosis not present

## 2024-03-29 DIAGNOSIS — R509 Fever, unspecified: Secondary | ICD-10-CM | POA: Diagnosis present

## 2024-03-29 DIAGNOSIS — J9601 Acute respiratory failure with hypoxia: Secondary | ICD-10-CM | POA: Diagnosis not present

## 2024-03-29 DIAGNOSIS — Z7901 Long term (current) use of anticoagulants: Secondary | ICD-10-CM

## 2024-03-29 DIAGNOSIS — J9600 Acute respiratory failure, unspecified whether with hypoxia or hypercapnia: Secondary | ICD-10-CM | POA: Diagnosis not present

## 2024-03-29 DIAGNOSIS — R4020 Unspecified coma: Secondary | ICD-10-CM | POA: Diagnosis not present

## 2024-03-29 DIAGNOSIS — G40909 Epilepsy, unspecified, not intractable, without status epilepticus: Secondary | ICD-10-CM | POA: Diagnosis not present

## 2024-03-29 DIAGNOSIS — R001 Bradycardia, unspecified: Secondary | ICD-10-CM | POA: Diagnosis not present

## 2024-03-29 LAB — GASTROINTESTINAL PANEL BY PCR, STOOL (REPLACES STOOL CULTURE)

## 2024-03-29 LAB — CBG MONITORING, ED
Glucose-Capillary: 82 mg/dL (ref 70–99)
Glucose-Capillary: 77 mg/dL (ref 70–99)

## 2024-03-29 LAB — URINALYSIS, W/ REFLEX TO CULTURE (INFECTION SUSPECTED)
Bilirubin Urine: NEGATIVE
Glucose, UA: NEGATIVE mg/dL
Hgb urine dipstick: NEGATIVE
Ketones, ur: NEGATIVE mg/dL
Nitrite: NEGATIVE
Protein, ur: 30 mg/dL — AB
Specific Gravity, Urine: 1.021 (ref 1.005–1.030)
pH: 5 (ref 5.0–8.0)

## 2024-03-29 LAB — RESPIRATORY PANEL BY PCR

## 2024-03-29 LAB — RESP PANEL BY RT-PCR (RSV, FLU A&B, COVID)  RVPGX2
Influenza A by PCR: NEGATIVE
Influenza B by PCR: NEGATIVE
Resp Syncytial Virus by PCR: NEGATIVE
SARS Coronavirus 2 by RT PCR: NEGATIVE

## 2024-03-29 LAB — URINE DRUG SCREEN, QUALITATIVE (ARMC ONLY)
Amphetamines, Ur Screen: NOT DETECTED
Barbiturates, Ur Screen: NOT DETECTED
Benzodiazepine, Ur Scrn: POSITIVE — AB
Cannabinoid 50 Ng, Ur ~~LOC~~: NOT DETECTED
Cocaine Metabolite,Ur ~~LOC~~: NOT DETECTED
MDMA (Ecstasy)Ur Screen: NOT DETECTED
Methadone Scn, Ur: NOT DETECTED
Opiate, Ur Screen: NOT DETECTED
Phencyclidine (PCP) Ur S: NOT DETECTED
Tricyclic, Ur Screen: NOT DETECTED

## 2024-03-29 LAB — PHOSPHORUS: Phosphorus: 3.5 mg/dL (ref 2.5–4.6)

## 2024-03-29 LAB — MAGNESIUM
Magnesium: 1.8 mg/dL (ref 1.7–2.4)
Magnesium: 1.5 mg/dL — ABNORMAL LOW (ref 1.7–2.4)

## 2024-03-29 LAB — CK: Total CK: 49 U/L (ref 38–234)

## 2024-03-29 LAB — GLUCOSE, CAPILLARY: Glucose-Capillary: 137 mg/dL — ABNORMAL HIGH (ref 70–99)

## 2024-03-29 LAB — LACTIC ACID, PLASMA: Lactic Acid, Venous: 1.3 mmol/L (ref 0.5–1.9)

## 2024-03-29 MED ORDER — LEVETIRACETAM (KEPPRA) 500 MG/5 ML ADULT IV PUSH: 500.0000 mg | Freq: Once | INTRAVENOUS | Status: DC

## 2024-03-29 MED ORDER — LORAZEPAM 2 MG/ML IJ SOLN: 5.0000 mg | Freq: Once | INTRAMUSCULAR | Status: DC

## 2024-03-29 MED ORDER — LEVOTHYROXINE SODIUM 125 MCG PO TABS
125.0000 ug | ORAL_TABLET | Freq: Every day | ORAL | Status: DC
  Filled 2024-03-29: qty 1

## 2024-03-29 MED ORDER — LEVETIRACETAM (KEPPRA) 500 MG/5 ML ADULT IV PUSH
1000.0000 mg | Freq: Once | INTRAVENOUS | Status: AC
  Administered 2024-03-29: 1000 mg via INTRAVENOUS
  Filled 2024-03-29: qty 10

## 2024-03-29 MED ORDER — OLANZAPINE 10 MG IM SOLR
2.5000 mg | Freq: Four times a day (QID) | INTRAMUSCULAR | Status: DC | PRN
  Administered 2024-03-29: 2.5 mg via INTRAMUSCULAR
  Filled 2024-03-29: qty 10

## 2024-03-29 MED ORDER — POTASSIUM CHLORIDE CRYS ER 20 MEQ PO TBCR: 40.0000 meq | EXTENDED_RELEASE_TABLET | Freq: Once | ORAL | Status: DC

## 2024-03-29 MED ORDER — METHYLPHENIDATE HCL 10 MG PO TABS
10.0000 mg | ORAL_TABLET | Freq: Two times a day (BID) | ORAL | Status: DC
Start: 2024-03-30 — End: 2024-03-30

## 2024-03-29 MED ORDER — MAGNESIUM SULFATE 2 GM/50ML IV SOLN
2.0000 g | Freq: Once | INTRAVENOUS | Status: AC
  Administered 2024-03-29: 2 g via INTRAVENOUS
  Filled 2024-03-29: qty 50

## 2024-03-29 MED ORDER — SODIUM CHLORIDE 0.9 % IV BOLUS
500.0000 mL | Freq: Once | INTRAVENOUS | Status: AC
  Administered 2024-03-29: 500 mL via INTRAVENOUS

## 2024-03-29 MED ORDER — ROSUVASTATIN CALCIUM 10 MG PO TABS: 20.0000 mg | ORAL_TABLET | Freq: Every day | ORAL | Status: DC

## 2024-03-29 MED ORDER — DEXTROSE-SODIUM CHLORIDE 5-0.9 % IV SOLN: INTRAVENOUS | Status: AC

## 2024-03-29 MED ORDER — SODIUM CHLORIDE 0.9% FLUSH
3.0000 mL | Freq: Two times a day (BID) | INTRAVENOUS | Status: DC
  Administered 2024-03-29: 3 mL via INTRAVENOUS
  Administered 2024-03-30: 10 mL via INTRAVENOUS

## 2024-03-29 MED ORDER — SODIUM CHLORIDE 0.9 % IV SOLN
1.0000 g | INTRAVENOUS | Status: DC
  Administered 2024-03-29: 1 g via INTRAVENOUS
  Filled 2024-03-29 (×2): qty 10

## 2024-03-29 MED ORDER — POTASSIUM CHLORIDE 20 MEQ PO PACK
40.0000 meq | PACK | Freq: Once | ORAL | Status: AC
Start: 2024-03-29 — End: 2024-03-29
  Administered 2024-03-29: 40 meq via ORAL
  Filled 2024-03-29: qty 2

## 2024-03-29 MED ORDER — SODIUM CHLORIDE 0.9 % IV BOLUS
1000.0000 mL | Freq: Once | INTRAVENOUS | Status: AC
  Administered 2024-03-29: 1000 mL via INTRAVENOUS

## 2024-03-29 MED ORDER — LORAZEPAM 2 MG/ML IJ SOLN
2.0000 mg | Freq: Once | INTRAMUSCULAR | Status: AC
  Administered 2024-03-29: 2 mg via INTRAVENOUS
  Filled 2024-03-29: qty 1

## 2024-03-29 MED ORDER — LEVETIRACETAM (KEPPRA) 500 MG/5 ML ADULT IV PUSH
500.0000 mg | Freq: Two times a day (BID) | INTRAVENOUS | Status: DC
  Administered 2024-03-29: 500 mg via INTRAVENOUS
  Filled 2024-03-29: qty 5

## 2024-03-29 MED ORDER — ORAL CARE MOUTH RINSE
15.0000 mL | OROMUCOSAL | Status: DC
  Administered 2024-03-29 – 2024-03-30 (×11): 15 mL via OROMUCOSAL
  Filled 2024-03-29 (×11): qty 15

## 2024-03-29 MED ORDER — ACETAMINOPHEN 325 MG PO TABS: 650.0000 mg | ORAL_TABLET | Freq: Four times a day (QID) | ORAL | Status: DC | PRN

## 2024-03-29 MED ORDER — SODIUM CHLORIDE 0.9% FLUSH: 3.0000 mL | INTRAVENOUS | Status: DC | PRN

## 2024-03-29 MED ORDER — LEVETIRACETAM (KEPPRA) 500 MG/5 ML ADULT IV PUSH
2000.0000 mg | Freq: Once | INTRAVENOUS | Status: AC
  Administered 2024-03-29: 2000 mg via INTRAVENOUS
  Filled 2024-03-29: qty 20

## 2024-03-29 MED ORDER — MAGNESIUM SULFATE IN D5W 1-5 GM/100ML-% IV SOLN: 1.0000 g | Freq: Once | INTRAVENOUS | Status: DC

## 2024-03-29 MED ORDER — LORAZEPAM 2 MG/ML IJ SOLN
2.0000 mg | Freq: Once | INTRAMUSCULAR | Status: AC
  Administered 2024-03-29: 2 mg via INTRAVENOUS

## 2024-03-29 MED ORDER — LEVETIRACETAM (KEPPRA) 500 MG/5 ML ADULT IV PUSH
750.0000 mg | Freq: Two times a day (BID) | INTRAVENOUS | Status: DC
  Administered 2024-03-29 – 2024-03-30 (×2): 750 mg via INTRAVENOUS
  Filled 2024-03-29 (×3): qty 7.5

## 2024-03-29 MED ORDER — ORAL CARE MOUTH RINSE: 15.0000 mL | OROMUCOSAL | Status: DC | PRN

## 2024-03-29 MED ORDER — LORAZEPAM 2 MG/ML IJ SOLN: 2.0000 mg | Freq: Once | INTRAMUSCULAR | Status: DC

## 2024-03-29 MED ORDER — DONEPEZIL HCL 5 MG PO TABS: 10.0000 mg | ORAL_TABLET | Freq: Every day | ORAL | Status: DC

## 2024-03-29 MED ORDER — SODIUM CHLORIDE 0.9 % IV SOLN: INTRAVENOUS | Status: AC

## 2024-03-29 MED ORDER — LOPERAMIDE HCL 2 MG PO CAPS: 2.0000 mg | ORAL_CAPSULE | ORAL | Status: DC | PRN

## 2024-03-29 MED ORDER — MELATONIN 5 MG PO TABS: 5.0000 mg | ORAL_TABLET | Freq: Every day | ORAL | Status: DC

## 2024-03-29 MED ORDER — SERTRALINE HCL 50 MG PO TABS: 25.0000 mg | ORAL_TABLET | Freq: Every day | ORAL | Status: DC

## 2024-03-29 MED ORDER — RISAQUAD PO CAPS: 1.0000 | ORAL_CAPSULE | Freq: Three times a day (TID) | ORAL | Status: DC

## 2024-03-29 MED ORDER — LORAZEPAM 2 MG/ML IJ SOLN
INTRAMUSCULAR | Status: AC
Start: 2024-03-29 — End: 2024-03-29
  Filled 2024-03-29: qty 1

## 2024-03-29 MED ORDER — LORAZEPAM 2 MG/ML IJ SOLN
2.0000 mg | INTRAMUSCULAR | Status: DC | PRN
  Filled 2024-03-29 (×2): qty 1

## 2024-03-29 NOTE — Progress Notes (Signed)
 Eeg done

## 2024-03-29 NOTE — ED Notes (Signed)
 NIH scale complete. All deficits are from previous stroke.

## 2024-03-29 NOTE — ED Notes (Signed)
 Per the night nurse family refused second set of blood cultures.

## 2024-03-29 NOTE — ED Notes (Signed)
 MD notified that pt's bolus was complete. Another bolus was ordered. Will continue to cycle Bp every 10 minutes.

## 2024-03-29 NOTE — Plan of Care (Addendum)
 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 Head CT showed further decrease size of the previously seen mixed density left subdural hematoma, now 3 mm in thickness compared to 7 mm previously.  She has a history of antiphospholipid syndrome and was on Eliquis  5 mg twice daily when she had the subdural in March.  She has been restarted now as an outpatient on Eliquis  2.5 mg twice daily to reduce the risk of arterial and venous thrombosis.  This was held on admission due to thrombocytopenia with platelets of 59.  She was loaded on Keppra 20 mg/kg on admission and started on 500 mg twice daily.  She has had 2 complex partial seizures today.  I have ordered an additional 2 g Keppra to be given now and 750 mg to be started this evening and then continued every 12 hours after that.  Both episodes aborted with Ativan.  I will also order a brain MRI and MRV head due to her history of antiphospholipid syndrome to rule out acute infarct and/or venous sinus thrombosis contributing.  She is also on ceftriaxone  for UTI.  EEG this afternoon showed no active seizures. Will see patient in follow-up tomorrow.  Greg Leaks, MD [facility] [phone]  If 7pm- 7am, please page neurology on call as listed in [facility].

## 2024-03-29 NOTE — ED Notes (Signed)
 Pt's daughter called and updated.Daughter will come to hospital when pt gets a bed

## 2024-03-29 NOTE — ED Notes (Signed)
 emptied out of urine canister.

## 2024-03-29 NOTE — Progress Notes (Signed)
 PT Cancellation Note  Patient Details Name: Alison Lopez MRN: [id number] DOB: 15-Feb-1956   Cancelled Treatment:    Reason Eval/Treat Not Completed: Medical issues which prohibited therapy Per chart review, pt continues to have medical concerns which would not allow her to actively participate in PT evaluation this date. PT to re-attempt when next available if pt is medically able.   Janine Melbourne, PT, DPT Physical Therapist - [place]  [facility]  Romir Klimowicz A Keimani Laufer 03/29/2024, 10:16 AM

## 2024-03-29 NOTE — ED Notes (Signed)
 Lab was called to obtain blood cultures after two ED Rns attempted to get an IV and were unsuccessful.  Pt was a hard stick and family declined the pt being stuck again for the second set of vitals. Lab was able to draw one culture. MD made aware.

## 2024-03-29 NOTE — ED Notes (Signed)
 Pt daughter called and updated about pt having a room and being moved from the ED.

## 2024-03-29 NOTE — Progress Notes (Deleted)
 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 Head CT showed further decrease size of the previously seen mixed density left subdural hematoma, now 3 mm in thickness compared to 7 mm previously.  She has a history of antiphospholipid syndrome and was on Eliquis  5 mg twice daily when she had the subdural in March.  She has been restarted now as an outpatient on Eliquis  2.5 mg twice daily to reduce the risk of arterial and venous thrombosis.  This was held on admission due to thrombocytopenia with platelets of 59.  She was loaded on Keppra 20 mg/kg on admission and started on 500 mg twice daily.  She has had 2 complex partial seizures today.  I have ordered an additional 2 g Keppra to be given now and 750 mg to be started this evening and then continued every 12 hours after that.  Both episodes aborted with Ativan.  I will also order a brain MRI and MRV head due to her history of antiphospholipid syndrome to rule out acute infarct and/or venous sinus thrombosis contributing.  She is also on ceftriaxone  for UTI.  Will see patient in follow-up tomorrow.  Greg Leaks, MD [facility] [phone]  If 7pm- 7am, please page neurology on call as listed in [facility].

## 2024-03-29 NOTE — ED Notes (Addendum)
 Alison Lopez

## 2024-03-29 NOTE — Progress Notes (Signed)
 SLP Cancellation Note  Patient Details Name: Alison Lopez MRN: [id number] DOB: 06-10-1956   Cancelled treatment:       Reason Eval/Treat Not Completed: Medical issues which prohibited therapy;Patient's level of consciousness;Patient not medically ready (chart reviewed; consulted NSG in room.)  Pt currently exhibits agitation and poor attention/engagement w/ NSG staff around her. She does not appear coherent to follow commands.  Pt is at a HIGH risk for aspiration w/ any oral intake. Recommend continue strict NPO status w/ oral care when calm. ST services will continue to monitor pt's status for appropriateness for BSE next 1-2 days.  NSG/MD updated, agreed.      Darla Edward, MS, CCC-SLP Speech Language Pathologist Rehab Services; [facility] [phone] (ascom) Maame Dack 03/29/2024, 12:25 PM

## 2024-03-29 NOTE — ED Notes (Signed)
 Patient having seizure like activity, twitching to right arm and foaming at the mouth; MD Zhang paged.

## 2024-03-29 NOTE — ED Provider Notes (Addendum)
 Signout received.  Neurology consultation over the telephone recommending IV Ativan and a load of Keppra 1000 mg followed by 500 mg twice daily for focal seizure.  Low-grade fever 100.7 already antibiotics given for suspected urinary tract infection Rocephin , UA still pending.  Patient stable.  Ongoing right sided shaking focally.  Admission.  Unfortunately got hypotensive after the IV Ativan and Keppra.  Maintaining airway.  Seizure activity has now stopped.  Sleeping comfortably.  Will give a IV fluid bolus and continue monitoring input on end-tidal CO2.  Blood pressure was low with maps in the 50s to 60s and the etiology is most likely the antiseizure medications that have worked to stop the seizure activity.  The blood pressure was noted to drop immediately after administration of medications and was normal just prior.  Lactic is normal as well as white blood cell count, though with evidence of urinary tract infection low blood pressure could be due to sepsis as well but I think given the timing is less likely.  After fluid bolus and some time her blood pressure have normalized and maps well above 65 for several hours now.  Admission to hospital service.     PROCEDURES:  Critical Care performed: Yes, see critical care procedure note(s)  .Critical Care  Performed by: Buell Carmin, MD Authorized by: Buell Carmin, MD   Critical care provider statement:    Critical care time (minutes):  30   Critical care was time spent personally by me on the following activities:  Development of treatment plan with patient or surrogate, discussions with consultants, evaluation of patient's response to treatment, examination of patient, ordering and review of laboratory studies, ordering and review of radiographic studies, ordering and performing treatments and interventions, pulse oximetry, re-evaluation of patient's condition and review of old charts      Buell Carmin, MD 03/29/24 0032    Buell Carmin,  MD 03/29/24 Phoebe Breed    Buell Carmin, MD 03/29/24 [phone]

## 2024-03-29 NOTE — ED Notes (Signed)
 Patient cleaned of bowel incontinence, new chux and depends applied, linens changed.

## 2024-03-29 NOTE — ED Notes (Addendum)
 Pt had a jelly like MB. Sample was saved for day shift staff incase sample was wanted to send to lab. PT provided peri care and complete bed change by this RN and RN, Satira Curet. Day RN updated about only one set of cultures being drawn due to daughter's refusal to have pt stuck again.

## 2024-03-29 NOTE — ED Notes (Addendum)
 Patient was found in bed having seizure like activity, MD Jeane Miguel paged and RN was given verbal order for Ativan 2mg . Pillows placed against side rails for safety. Mouth suctioned.

## 2024-03-29 NOTE — ED Notes (Addendum)
 ED provider updated about pt's last CBG and BP. RN adjusted Bp cuff which provided a more accurate and better BP reading.

## 2024-03-29 NOTE — ED Notes (Signed)
 PT came to patient's bedside, unable to work with patient due to patient's current status. Patient writhing around in bed, not directable. States she will try again at another time.

## 2024-03-29 NOTE — Consult Note (Signed)
 [facility] Tele[facility]rology Consult Services  Stat Consult  Patient Name:   Alison Lopez, Alison Lopez Date of Birth:   Mar 03, 1956 Identification Number:   MRN - [id number] Date of Service:   03/28/2024 22:57:35  Diagnosis:       G40.89 - Other seizures  Impression 68 year old female who presents with continuous right side twitching and right gaze deviation. 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   Recommendations: Our recommendations are outlined below.  Medications : Keppra 1gm now Keppra 500 mg BID Recommend 2mg  Ativan now given on going seizures.  Nursing Recommendations : Maintain Euglycemia and Euthermia Once stable neuro checks q 4hrs  Seizure precautions : Seizure precautions including no driving for state mandated time frame were discussed with patient with clear understanding  DVT Prophylaxis : Choice of Primary Team  Disposition : Neurology will follow Additional Recommendations: metabolic/infectious work-up per primary team    ----------------------------------------------------------------------------------------------------    Metrics: Dispatch Time: 03/28/2024 22:54:12 Callback Response Time: 03/28/2024 22:58:14  Primary Provider Notified of Diagnostic Impression and Management Plan on: 03/28/2024 23:49:51   CT HEAD: I personally reviewed all the CT images that were available to me and it showed: Further decreased size of the previously seen mixed density left SDH, no 3 mm in thickness compared to 7mm previously.    ----------------------------------------------------------------------------------------------------  Chief Complaint: Right side twitching  History of Present Illness: Patient is a 68 year old Female. 68 year old female with a history of dementia and spontaneous SDH in March who presents to the hospital with right side twitching. Patient's daughter reports the right side  twitching started around 2pm and has not stopped. There is no known history of seizures. On exam patient has continuous right side twitching with right gaze deviation.   Past Medical History:      Stroke       Dementia/MCI  Medications:  No Anticoagulant use  No Antiplatelet use Reviewed EMR for current medications  Allergies:  Reviewed  Social History: Drug Use: No  Family History:  There is no family history of premature cerebrovascular disease pertinent to this consultation  ROS : 14 Points Review of Systems was performed and was negative except mentioned in HPI.  Past Surgical History: There Is No Surgical History Contributory To Today's Visit   Examination: BP(130/76), Pulse(136),  Neuro Exam: General: Alert,Awake, Oriented, Person  Speech: Dysarthric:  Language: Mild Aphasia:  Face: Facial Droop: Right  Extraocular Movements: Gaze Preference: Right  Motor Exam: Drift: RUE, RLE  Spoke with : Dr. Margery Sheets    This consult was conducted in real time using interactive audio and Immunologist. Patient was informed of the technology being used for this visit and agreed to proceed. Patient located in hospital and provider located at home/office setting.  Patient is being evaluated for possible acute neurologic impairment and high probability of imminent or life - threatening deterioration.I spent total of 35 minutes providing care to this patient, including time for face to face visit via telemedicine, review of medical records, imaging studies and discussion of findings with providers, the patient and / or family.   Dr Leandra Pro   [facility] For Inpatient follow-up with [facility] physician please call [facility] at [phone]. As we are not an outpatient service for any post hospital discharge needs please contact the hospital for assistance.  If you have any questions for the [facility] physicians or need to reconsult for clinical or  diagnostic changes please contact us  via [facility] at [phone].

## 2024-03-29 NOTE — Progress Notes (Signed)
 OT Cancellation Note  Patient Details Name: Alison Lopez MRN: [id number] DOB: [birth year]   Cancelled Treatment:    Reason Eval/Treat Not Completed: Medical issues which prohibited therapy. Per chart review, pt continues to have medical concerns which would not allow her to actively participate in OT evaluation this date. OT to re-attempt when next available if pt is medically able.   George Kinder, MS, OTR/L , CBIS ascom [phone]  03/29/24, 10:02 AM

## 2024-03-29 NOTE — H&P (Signed)
 History and Physical    Alison Lopez BJY:[id number] DOB: 03-14-1956 DOA: 03/28/2024  PCP: Alexander Anes, MD (Confirm with patient/family/NH records and if not entered, this has to be entered at [facility] point of entry) Patient coming from: Home  I have personally briefly reviewed patient's old medical records in [facility]  Chief Complaint: Seizure  HPI: Alison Lopez is a 68 y.o. female with medical history significant of dementia, recently-spontaneous subdural hematoma, antiphospholipid syndrome and DVT on Eliquis , hypothyroidism, HLD, chronic thrombocytopenia, brought in by family member for evaluation of new onset of seizure and mentation changes.  Patient has significant altered mental status unable to provide history, all history provided by daughter over the phone.  Patient was recently hospitalized for spontaneous subdural hematoma (SDH) in April and discharged to rehab center, just came back to home last week from rehab center.  On discharge, patient had residual right-sided weakness, at baseline, she is right-sided dominant, and after the SDH, she has been unable to hold spoon or cup, has to be fed for her meals.  Normally patient can recognize family members and some doctor visitors and engage in simple conversation.  Yesterday afternoon, patient started to have noticeable tremors on right arm and right leg.  Patient told daughter that she had several similar episode while she was in rehab center.  Soon followed however patient became more confused and daughter decided to call EMS.  Daughter also reported the patient has had 2-3 loose bowel movement every day for last 4 to 5 days, family has contacted her doctor who recommend patient stop taking iron  pills.  Patient has denied no abdominal pain, no nauseous vomiting but family did not notice patient has poor appetite for last few days. ED Course: Patient was found to have low-grade fever 100.1, borderline bradycardia borderline  hypotensive and responded to IV fluid bolus.  Blood pressure 110/62 O2 saturation 100% on room air.  CT head showed decreased size of SDH compared to last month.  Chest x-ray negative for acute infiltrates.  UA showed 2+ WBC and 1+ RBC, blood work showed hemoglobin 11.4 WBC 7.6 BUN 15 creatinine 0.6 bicarb 22K 2.8, platelet 59.  Neurology consulted, patient was given loading dose of Keppra in the ED.  And started on ceftriaxone .  Review of Systems: Unable to perform, patient is confused. Past Medical History:  Diagnosis Date   Anxiety    Arthritis    Collagen vascular disease (HCC)    COPD (chronic obstructive pulmonary disease) (HCC)    DDD (degenerative disc disease), lumbar    Dementia (HCC)    Depression    Diabetes mellitus without complication (HCC)    DVT (deep venous thrombosis) (HCC)    Fibromyalgia    GERD (gastroesophageal reflux disease)    Graves' disease with exophthalmos    HLD (hyperlipidemia)    Hypertension    Hypothyroidism    Lupus    Osteoporosis     Past Surgical History:  Procedure Laterality Date   ABDOMINAL HYSTERECTOMY     BURR HOLE Bilateral 01/30/2024   Procedure: CREATION, CRANIAL BURR HOLE, SUBDURAL HEMATOMA EVACUATIOIN;  Surgeon: Jodeen Munch, MD;  Location: [facility] ORS;  Service: Neurosurgery;  Laterality: Bilateral;  need ventriculostomy x 2   COLONOSCOPY WITH PROPOFOL  N/A 06/11/2021   Procedure: COLONOSCOPY WITH PROPOFOL ;  Surgeon: Shane Darling, MD;  Location: [facility] ENDOSCOPY;  Service: Endoscopy;  Laterality: N/A;  prefers afternoon Eliquis    COLONOSCOPY WITH PROPOFOL  N/A 12/19/2022   Procedure: COLONOSCOPY  WITH PROPOFOL ;  Surgeon: Shane Darling, MD;  Location: [facility] ENDOSCOPY;  Service: Endoscopy;  Laterality: N/A;   ESOPHAGOGASTRODUODENOSCOPY N/A 06/11/2021   Procedure: ESOPHAGOGASTRODUODENOSCOPY (EGD);  Surgeon: Shane Darling, MD;  Location: [facility] ENDOSCOPY;  Service: Endoscopy;  Laterality: N/A;   EYE SURGERY Right    IVC  FILTER REMOVAL N/A 07/21/2019   Procedure: IVC FILTER REMOVAL;  Surgeon: Celso College, MD;  Location: [facility] INVASIVE CV LAB;  Service: Cardiovascular;  Laterality: N/A;   LOWER EXTREMITY INTERVENTION Right 04/22/2019   Procedure: IVC Filter Insertion with Right Lower Extremity Venous Lysis;  Surgeon: Celso College, MD;  Location: [facility] INVASIVE CV LAB;  Service: Cardiovascular;  Laterality: Right;   OOPHORECTOMY       reports that she quit smoking about 23 years ago. Her smoking use included cigarettes. She started smoking about 40 years ago. She has a 17 pack-year smoking history. She has never used smokeless tobacco. She reports that she does not currently use alcohol. She reports that she does not use drugs.  Allergies  Allergen Reactions   Morphine And Codeine Anaphylaxis   Montelukast      Other reaction(s): Other (See Comments) Nightmares   Antihistamines, Diphenhydramine -Type Nausea Only   Aspirin Other (See Comments)    Patient on blood thinners and was told not to take   Benadryl  [Diphenhydramine  Hcl] Hives   Codeine Other (See Comments)    AMS    Family History  Problem Relation Age of Onset   Stomach cancer Mother    Heart attack Father    Breast cancer Neg Hx      Prior to Admission medications   Medication Sig Start Date End Date Taking? Authorizing Provider  acetaminophen  (TYLENOL ) 325 MG tablet Take 2 tablets (650 mg total) by mouth every 6 (six) hours as needed for mild pain (pain score 1-3) (or Fever >/= 101). 03/03/24  Yes Angiulli, Everlyn Hockey, PA-C  apixaban  (ELIQUIS ) 2.5 MG TABS tablet Take 1 tablet (2.5 mg total) by mouth 2 (two) times daily. 03/14/24  Yes [name], MD  ascorbic acid  (VITAMIN C ) 1000 MG tablet Take 1 tablet (1,000 mg total) by mouth daily. 03/03/24  Yes Angiulli, Everlyn Hockey, PA-C  cyanocobalamin  (VITAMIN B12) 1000 MCG tablet Take 1 tablet (1,000 mcg total) by mouth daily. 03/14/24  Yes [name], MD  donepezil  (ARICEPT ) 10 MG tablet Take 1 tablet (10 mg  total) by mouth at bedtime. 03/03/24  Yes Angiulli, Everlyn Hockey, PA-C  ergocalciferol  (VITAMIN D2) 1.25 MG (50000 UT) capsule Take 1 capsule (50,000 Units total) by mouth every Friday. 03/04/24  Yes Angiulli, Daniel J, PA-C  levothyroxine  (SYNTHROID ) 125 MCG tablet Take 1 tablet (125 mcg total) by mouth daily before breakfast. 03/03/24  Yes Angiulli, Everlyn Hockey, PA-C  melatonin 5 MG TABS Take 1 tablet (5 mg total) by mouth at bedtime. 03/03/24  Yes Angiulli, Everlyn Hockey, PA-C  methylphenidate  (RITALIN ) 10 MG tablet Take 1 tablet (10 mg total) by mouth 2 (two) times daily with breakfast and lunch. 03/15/24  Yes Jodi Munroe, NP  rosuvastatin  (CRESTOR ) 20 MG tablet Take 1 tablet (20 mg total) by mouth daily. 03/03/24  Yes Angiulli, Everlyn Hockey, PA-C  sertraline  (ZOLOFT ) 25 MG tablet Take 1 tablet (25 mg total) by mouth daily. 03/03/24  Yes Sterling Eisenmenger, PA-C    Physical Exam: Vitals:   03/29/24 0510 03/29/24 0520 03/29/24 0626 03/29/24 0640  BP: (!) 115/104 114/62  (!) 95/43  Pulse: 76 78  60  Resp: 20 19  15   Temp:   97.8 F (36.6 C)   TempSrc:   Axillary   SpO2: 100% 100%  100%  Weight:      Height:        Constitutional: NAD, calm, comfortable Vitals:   03/29/24 0510 03/29/24 0520 03/29/24 0626 03/29/24 0640  BP: (!) 115/104 114/62  (!) 95/43  Pulse: 76 78  60  Resp: 20 19  15   Temp:   97.8 F (36.6 C)   TempSrc:   Axillary   SpO2: 100% 100%  100%  Weight:      Height:       Eyes: PERRL, lids and conjunctivae normal ENMT: Mucous membranes are moist. Posterior pharynx clear of any exudate or lesions.Normal dentition.  Neck: normal, supple, no masses, no thyromegaly Respiratory: clear to auscultation bilaterally, no wheezing, no crackles. Normal respiratory effort. No accessory muscle use.  Cardiovascular: Regular rate and rhythm, no murmurs / rubs / gallops. No extremity edema. 2+ pedal pulses. No carotid bruits.  Abdomen: no tenderness, no masses palpated. No hepatosplenomegaly.  Bowel sounds positive.  Musculoskeletal: no clubbing / cyanosis. No joint deformity upper and lower extremities. Good ROM, no contractures. Normal muscle tone.  Skin: no rashes, lesions, ulcers. No induration Neurologic: No facial droops, patient is not following commands, as a result, unable to evaluate her strength and sensation, but otherwise moving all limbs. Psychiatric: Arousable to voice, confused    Labs on Admission: I have personally reviewed following labs and imaging studies  CBC: Recent Labs  Lab 03/28/24 2133  WBC 7.6  NEUTROABS 5.8  HGB 11.4*  HCT 33.5*  MCV 83.8  PLT 59*   Basic Metabolic Panel: Recent Labs  Lab 03/28/24 2133  NA 135  K 2.8*  CL 104  CO2 22  GLUCOSE 112*  BUN 15  CREATININE 0.69  CALCIUM  8.4*  MG 1.8   GFR: Estimated Creatinine Clearance: 43.8 mL/min (by C-G formula based on SCr of 0.69 mg/dL). Liver Function Tests: Recent Labs  Lab 03/28/24 2133  AST 19  ALT 10  ALKPHOS 54  BILITOT 0.7  PROT 6.2*  ALBUMIN 3.0*   No results for input(s): "LIPASE", "AMYLASE" in the last 168 hours. No results for input(s): "AMMONIA" in the last 168 hours. Coagulation Profile: Recent Labs  Lab 03/28/24 2133  INR 1.3*   Cardiac Enzymes: No results for input(s): "CKTOTAL", "CKMB", "CKMBINDEX", "TROPONINI" in the last 168 hours. BNP (last 3 results) No results for input(s): "PROBNP" in the last 8760 hours. HbA1C: No results for input(s): "HGBA1C" in the last 72 hours. CBG: Recent Labs  Lab 03/29/24 0227 03/29/24 0427  GLUCAP 82 77   Lipid Profile: No results for input(s): "CHOL", "HDL", "LDLCALC", "TRIG", "CHOLHDL", "LDLDIRECT" in the last 72 hours. Thyroid  Function Tests: No results for input(s): "TSH", "T4TOTAL", "FREET4", "T3FREE", "THYROIDAB" in the last 72 hours. Anemia Panel: No results for input(s): "VITAMINB12", "FOLATE", "FERRITIN", "TIBC", "IRON ", "RETICCTPCT" in the last 72 hours. Urine analysis:    Component Value  Date/Time   COLORURINE YELLOW (A) 03/29/2024 0110   APPEARANCEUR HAZY (A) 03/29/2024 0110   LABSPEC 1.021 03/29/2024 0110   PHURINE 5.0 03/29/2024 0110   GLUCOSEU NEGATIVE 03/29/2024 0110   HGBUR NEGATIVE 03/29/2024 0110   BILIRUBINUR NEGATIVE 03/29/2024 0110   KETONESUR NEGATIVE 03/29/2024 0110   PROTEINUR 30 (A) 03/29/2024 0110   NITRITE NEGATIVE 03/29/2024 0110   LEUKOCYTESUR TRACE (A) 03/29/2024 0110    Radiological Exams on Admission: CT Head Wo  Contrast Result Date: 03/28/2024 CLINICAL DATA:  Right side tremors EXAM: CT HEAD WITHOUT CONTRAST TECHNIQUE: Contiguous axial images were obtained from the base of the skull through the vertex without intravenous contrast. RADIATION DOSE REDUCTION: This exam was performed according to the departmental dose-optimization program which includes automated exposure control, adjustment of the mA and/or kV according to patient size and/or use of iterative reconstruction technique. 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 Further decreased size of the previously seen mixed density left subdural hematoma, now 3 mm in thickness compared to 7 mm previously. No mass effect or midline shift. No new hemorrhage or infarction. There is atrophy and chronic small vessel disease changes. Vascular: No hyperdense vessel or unexpected calcification. Skull: No acute calvarial abnormality. Sinuses/Orbits: No acute findings Other: None IMPRESSION: Further decreased size of the previously seen mixed density left subdural hematoma, now 3 mm in thickness compared to 7 mm previously. 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 Atrophy, chronic microvascular disease. No acute intracranial abnormality. Electronically Signed   By: Janeece Mechanic M.D.   On: 03/28/2024 23:30   DG Chest Port 1 View Result Date: 03/28/2024 CLINICAL DATA:  Questionable sepsis EXAM: PORTABLE CHEST 1 VIEW COMPARISON:  Chest x-ray 02/13/2024 FINDINGS: The heart size and  mediastinal contours are within normal limits. Both lungs are clear. The visualized skeletal structures are unremarkable. IMPRESSION: No active disease. Electronically Signed   By: Tyron Gallon M.D.   On: 03/28/2024 23:28    EKG: Independently reviewed.  Sinus rhythm, frequent PVCs.  Assessment/Plan Principal Problem:   Sepsis due to gram-negative UTI [facility]) Active Problems:   Partial seizure (HCC)   Seizure (HCC)  (please populate well all problems here in Problem List. (For example, if patient is on BP meds at home and you resume or decide to hold them, it is a problem that needs to be her. Same for CAD, COPD, HLD and so on)  AMS New onset of seizure, likely complex partial seizure - Appears to be related to recent subdural hematoma.  Brain imaging including head CT reassuring, which showed decreased size of SDH. - As per recommendation from neurology, patient was loaded with Keppra and will order continuous maintenance Keppra 500 mg twice daily - EEG - Seizure precaution - N.p.o. for now, as her mentation still not back to baseline, and speech evaluation  SIRS - Borderline fever and borderline hypotension but no leukocytosis.  There is a concurrent UTI but otherwise no signs of sepsis.  UTI with hematuria - Continue ceftriaxone   Diarrhea - Given there is a concurrent low-grade fever, will check GI pathogen panel and respiratory pathogen panel. - Imodium as needed when able to take p.o.  Hypokalemia - IV and p.o. replacement  Recent spontaneous subdural hematoma - CT head last night showed decreased size of SDH  Chronic thrombocytopenia - Decreased platelet count.  Will hold off Eliquis  and chemical DVT prophylaxis and repeat CBC tomorrow  History of antiphospholipid syndrome and chronic hypercoagulable state - Has been on prophylactic dosage of Eliquis , which is on hold now for worsening of thrombocytopenia. - Hemoglobin level stable  Advanced dementia - Resume Aricept   when able to swallow pills  Deconditioning - PT OT evaluation   DVT prophylaxis: SCD Code Status: Full code Family Communication: Daughter over the phone Disposition Plan: Expect less than 2 midnight hospital stay Consults called: Neurology Admission status: Telemetry observation   [place] MD [facility] Pager [phone]  03/29/2024, 8:07 AM

## 2024-03-29 NOTE — Progress Notes (Signed)
 Patient had another episode of partial complex seizure with right-sided twitching like movement and unresponsiveness this afternoon, after given another 2 mg of Ativan seizure stopped.  Went to see the patient, she is sedated.  Discussed case with patient daughter at bedside, all question answered with my best knowledge.  EEG is done around noontime, result pending.  Messaged neurology about the progress.

## 2024-03-29 NOTE — Significant Event (Signed)
 Patient had an another partial complex seizure, loss consciousness and was seen to have twitching movements of right side of face right arm and right shoulder.  2 mg of Ativan given and the twitching movement stopped.  Mentation not recovered remain unresponsive.  Messaged neurology.  EEG is ordered.

## 2024-03-29 NOTE — Progress Notes (Signed)
 CODE SEPSIS - PHARMACY COMMUNICATION  **Broad Spectrum Antibiotics should be administered within 1 hour of Sepsis diagnosis**  Time Code Sepsis Called/Page Received: 5/19 @ 2241  Antibiotics Ordered: Ceftriaxone    Time of 1st antibiotic administration: Ceftriaxone  2 gm IV X 1 on 5/20 @ 2354   Additional action taken by pharmacy: none, missed 1 hr cut off ... Pharmacy very busy   If necessary, Name of Provider/Nurse Contacted:     Laterra Lubinski D ,PharmD Clinical Pharmacist  03/29/2024  12:49 AM

## 2024-03-29 NOTE — Plan of Care (Signed)

## 2024-03-29 NOTE — ED Notes (Signed)
 Patient cleaned of bowel and urinary incontinence; new chux and brief applied, linen and gown changed.

## 2024-03-29 NOTE — ED Notes (Signed)
 MD made aware of pt's bp. Fluid bolus and CO2 monitoring ordered.

## 2024-03-29 NOTE — ED Notes (Signed)
Dr. Zhang at bedside.  

## 2024-03-29 NOTE — ED Notes (Signed)
 EEG tech made aware that patient is not able to tolerate being still for EEG at this time. Requested for tech to let me know when she plans on performing the study and I will medicate per MAR.

## 2024-03-30 ENCOUNTER — Inpatient Hospital Stay

## 2024-03-30 ENCOUNTER — Encounter (HOSPITAL_COMMUNITY): Payer: Self-pay

## 2024-03-30 ENCOUNTER — Inpatient Hospital Stay (HOSPITAL_COMMUNITY)
Admission: AD | Admit: 2024-03-30 | Discharge: 2024-05-10 | DRG: 853 | Disposition: E | Source: Other Acute Inpatient Hospital | Attending: Pulmonary Disease | Admitting: Pulmonary Disease

## 2024-03-30 ENCOUNTER — Inpatient Hospital Stay (HOSPITAL_COMMUNITY)

## 2024-03-30 DIAGNOSIS — R4020 Unspecified coma: Secondary | ICD-10-CM | POA: Diagnosis not present

## 2024-03-30 DIAGNOSIS — K219 Gastro-esophageal reflux disease without esophagitis: Secondary | ICD-10-CM | POA: Diagnosis present

## 2024-03-30 DIAGNOSIS — F0390 Unspecified dementia without behavioral disturbance: Secondary | ICD-10-CM | POA: Diagnosis present

## 2024-03-30 DIAGNOSIS — R569 Unspecified convulsions: Principal | ICD-10-CM

## 2024-03-30 DIAGNOSIS — N3000 Acute cystitis without hematuria: Secondary | ICD-10-CM | POA: Diagnosis present

## 2024-03-30 DIAGNOSIS — M069 Rheumatoid arthritis, unspecified: Secondary | ICD-10-CM | POA: Diagnosis present

## 2024-03-30 DIAGNOSIS — R6521 Severe sepsis with septic shock: Secondary | ICD-10-CM | POA: Diagnosis present

## 2024-03-30 DIAGNOSIS — A4150 Gram-negative sepsis, unspecified: Secondary | ICD-10-CM | POA: Diagnosis present

## 2024-03-30 DIAGNOSIS — F32A Depression, unspecified: Secondary | ICD-10-CM | POA: Diagnosis present

## 2024-03-30 DIAGNOSIS — J9601 Acute respiratory failure with hypoxia: Secondary | ICD-10-CM | POA: Diagnosis not present

## 2024-03-30 DIAGNOSIS — Z885 Allergy status to narcotic agent status: Secondary | ICD-10-CM

## 2024-03-30 DIAGNOSIS — Y711 Therapeutic (nonsurgical) and rehabilitative cardiovascular devices associated with adverse incidents: Secondary | ICD-10-CM | POA: Diagnosis not present

## 2024-03-30 DIAGNOSIS — G40909 Epilepsy, unspecified, not intractable, without status epilepticus: Secondary | ICD-10-CM | POA: Diagnosis not present

## 2024-03-30 DIAGNOSIS — Z1623 Resistance to quinolones and fluoroquinolones: Secondary | ICD-10-CM | POA: Diagnosis present

## 2024-03-30 DIAGNOSIS — A4151 Sepsis due to Escherichia coli [E. coli]: Principal | ICD-10-CM | POA: Diagnosis present

## 2024-03-30 DIAGNOSIS — I69251 Hemiplegia and hemiparesis following other nontraumatic intracranial hemorrhage affecting right dominant side: Secondary | ICD-10-CM

## 2024-03-30 DIAGNOSIS — Z8679 Personal history of other diseases of the circulatory system: Secondary | ICD-10-CM | POA: Diagnosis not present

## 2024-03-30 DIAGNOSIS — E876 Hypokalemia: Secondary | ICD-10-CM | POA: Diagnosis present

## 2024-03-30 DIAGNOSIS — Z8249 Family history of ischemic heart disease and other diseases of the circulatory system: Secondary | ICD-10-CM

## 2024-03-30 DIAGNOSIS — J9600 Acute respiratory failure, unspecified whether with hypoxia or hypercapnia: Secondary | ICD-10-CM | POA: Diagnosis not present

## 2024-03-30 DIAGNOSIS — Z87891 Personal history of nicotine dependence: Secondary | ICD-10-CM | POA: Diagnosis not present

## 2024-03-30 DIAGNOSIS — R001 Bradycardia, unspecified: Secondary | ICD-10-CM | POA: Diagnosis not present

## 2024-03-30 DIAGNOSIS — Z66 Do not resuscitate: Secondary | ICD-10-CM | POA: Diagnosis not present

## 2024-03-30 DIAGNOSIS — R197 Diarrhea, unspecified: Secondary | ICD-10-CM | POA: Diagnosis not present

## 2024-03-30 DIAGNOSIS — R579 Shock, unspecified: Secondary | ICD-10-CM | POA: Diagnosis not present

## 2024-03-30 DIAGNOSIS — N390 Urinary tract infection, site not specified: Secondary | ICD-10-CM

## 2024-03-30 DIAGNOSIS — Z7401 Bed confinement status: Secondary | ICD-10-CM | POA: Diagnosis not present

## 2024-03-30 DIAGNOSIS — Y92239 Unspecified place in hospital as the place of occurrence of the external cause: Secondary | ICD-10-CM | POA: Diagnosis not present

## 2024-03-30 DIAGNOSIS — R471 Dysarthria and anarthria: Secondary | ICD-10-CM | POA: Diagnosis present

## 2024-03-30 DIAGNOSIS — S25102A Unspecified injury of left innominate or subclavian artery, initial encounter: Secondary | ICD-10-CM | POA: Diagnosis not present

## 2024-03-30 DIAGNOSIS — I6389 Other cerebral infarction: Secondary | ICD-10-CM | POA: Diagnosis present

## 2024-03-30 DIAGNOSIS — J690 Pneumonitis due to inhalation of food and vomit: Secondary | ICD-10-CM | POA: Diagnosis present

## 2024-03-30 DIAGNOSIS — Z79890 Hormone replacement therapy (postmenopausal): Secondary | ICD-10-CM

## 2024-03-30 DIAGNOSIS — T82524A Displacement of infusion catheter, initial encounter: Secondary | ICD-10-CM | POA: Diagnosis not present

## 2024-03-30 DIAGNOSIS — Z7901 Long term (current) use of anticoagulants: Secondary | ICD-10-CM | POA: Diagnosis not present

## 2024-03-30 DIAGNOSIS — Z79899 Other long term (current) drug therapy: Secondary | ICD-10-CM | POA: Diagnosis not present

## 2024-03-30 DIAGNOSIS — I10 Essential (primary) hypertension: Secondary | ICD-10-CM | POA: Diagnosis present

## 2024-03-30 DIAGNOSIS — Y829 Unspecified medical devices associated with adverse incidents: Secondary | ICD-10-CM | POA: Diagnosis not present

## 2024-03-30 DIAGNOSIS — R531 Weakness: Secondary | ICD-10-CM | POA: Diagnosis not present

## 2024-03-30 DIAGNOSIS — E119 Type 2 diabetes mellitus without complications: Secondary | ICD-10-CM | POA: Diagnosis present

## 2024-03-30 DIAGNOSIS — Z888 Allergy status to other drugs, medicaments and biological substances status: Secondary | ICD-10-CM

## 2024-03-30 DIAGNOSIS — M797 Fibromyalgia: Secondary | ICD-10-CM | POA: Diagnosis present

## 2024-03-30 DIAGNOSIS — G40209 Localization-related (focal) (partial) symptomatic epilepsy and epileptic syndromes with complex partial seizures, not intractable, without status epilepticus: Secondary | ICD-10-CM | POA: Diagnosis not present

## 2024-03-30 DIAGNOSIS — G40101 Localization-related (focal) (partial) symptomatic epilepsy and epileptic syndromes with simple partial seizures, not intractable, with status epilepticus: Secondary | ICD-10-CM | POA: Diagnosis present

## 2024-03-30 DIAGNOSIS — Z886 Allergy status to analgesic agent status: Secondary | ICD-10-CM

## 2024-03-30 DIAGNOSIS — E039 Hypothyroidism, unspecified: Secondary | ICD-10-CM | POA: Diagnosis present

## 2024-03-30 DIAGNOSIS — Z9911 Dependence on respirator [ventilator] status: Secondary | ICD-10-CM | POA: Diagnosis not present

## 2024-03-30 DIAGNOSIS — K148 Other diseases of tongue: Secondary | ICD-10-CM | POA: Diagnosis not present

## 2024-03-30 DIAGNOSIS — I6200 Nontraumatic subdural hemorrhage, unspecified: Secondary | ICD-10-CM | POA: Diagnosis not present

## 2024-03-30 DIAGNOSIS — D6861 Antiphospholipid syndrome: Secondary | ICD-10-CM | POA: Diagnosis present

## 2024-03-30 DIAGNOSIS — R54 Age-related physical debility: Secondary | ICD-10-CM | POA: Diagnosis present

## 2024-03-30 DIAGNOSIS — Z862 Personal history of diseases of the blood and blood-forming organs and certain disorders involving the immune mechanism: Secondary | ICD-10-CM | POA: Diagnosis not present

## 2024-03-30 DIAGNOSIS — R258 Other abnormal involuntary movements: Secondary | ICD-10-CM | POA: Diagnosis present

## 2024-03-30 DIAGNOSIS — R339 Retention of urine, unspecified: Secondary | ICD-10-CM | POA: Diagnosis not present

## 2024-03-30 DIAGNOSIS — T8249XA Other complication of vascular dialysis catheter, initial encounter: Secondary | ICD-10-CM | POA: Diagnosis not present

## 2024-03-30 DIAGNOSIS — I959 Hypotension, unspecified: Secondary | ICD-10-CM | POA: Diagnosis not present

## 2024-03-30 DIAGNOSIS — D696 Thrombocytopenia, unspecified: Secondary | ICD-10-CM | POA: Diagnosis present

## 2024-03-30 DIAGNOSIS — Z515 Encounter for palliative care: Secondary | ICD-10-CM | POA: Diagnosis not present

## 2024-03-30 DIAGNOSIS — E785 Hyperlipidemia, unspecified: Secondary | ICD-10-CM | POA: Diagnosis present

## 2024-03-30 DIAGNOSIS — I998 Other disorder of circulatory system: Secondary | ICD-10-CM | POA: Diagnosis present

## 2024-03-30 DIAGNOSIS — M329 Systemic lupus erythematosus, unspecified: Secondary | ICD-10-CM | POA: Diagnosis present

## 2024-03-30 DIAGNOSIS — A419 Sepsis, unspecified organism: Secondary | ICD-10-CM | POA: Diagnosis not present

## 2024-03-30 DIAGNOSIS — Z90722 Acquired absence of ovaries, bilateral: Secondary | ICD-10-CM

## 2024-03-30 DIAGNOSIS — Z9889 Other specified postprocedural states: Secondary | ICD-10-CM | POA: Diagnosis not present

## 2024-03-30 DIAGNOSIS — R509 Fever, unspecified: Secondary | ICD-10-CM | POA: Diagnosis present

## 2024-03-30 DIAGNOSIS — Z90710 Acquired absence of both cervix and uterus: Secondary | ICD-10-CM

## 2024-03-30 DIAGNOSIS — J449 Chronic obstructive pulmonary disease, unspecified: Secondary | ICD-10-CM | POA: Diagnosis present

## 2024-03-30 DIAGNOSIS — R627 Adult failure to thrive: Secondary | ICD-10-CM | POA: Diagnosis present

## 2024-03-30 DIAGNOSIS — G9340 Encephalopathy, unspecified: Secondary | ICD-10-CM | POA: Diagnosis not present

## 2024-03-30 DIAGNOSIS — Z86718 Personal history of other venous thrombosis and embolism: Secondary | ICD-10-CM

## 2024-03-30 DIAGNOSIS — Z7189 Other specified counseling: Secondary | ICD-10-CM | POA: Diagnosis not present

## 2024-03-30 DIAGNOSIS — K140 Glossitis: Secondary | ICD-10-CM | POA: Diagnosis not present

## 2024-03-30 LAB — MAGNESIUM
Magnesium: 1.7 mg/dL (ref 1.7–2.4)
Magnesium: 1.6 mg/dL — ABNORMAL LOW (ref 1.7–2.4)

## 2024-03-30 LAB — CBC
HCT: 31.1 % — ABNORMAL LOW (ref 36.0–46.0)
Hemoglobin: 10.2 g/dL — ABNORMAL LOW (ref 12.0–15.0)
MCH: 27.8 pg (ref 26.0–34.0)
MCHC: 32.8 g/dL (ref 30.0–36.0)
MCV: 84.7 fL (ref 80.0–100.0)
Platelets: 54 10*3/uL — ABNORMAL LOW (ref 150–400)
RBC: 3.67 MIL/uL — ABNORMAL LOW (ref 3.87–5.11)
RDW: 14.3 % (ref 11.5–15.5)
WBC: 8.9 10*3/uL (ref 4.0–10.5)
nRBC: 0 % (ref 0.0–0.2)

## 2024-03-30 LAB — BASIC METABOLIC PANEL WITH GFR
Anion gap: 8 (ref 5–15)
Anion gap: 8 (ref 5–15)
BUN: <5 mg/dL — ABNORMAL LOW (ref 8–23)
BUN: <5 mg/dL — ABNORMAL LOW (ref 8–23)
CO2: 22 mmol/L (ref 22–32)
CO2: 21 mmol/L — ABNORMAL LOW (ref 22–32)
Calcium: 7.8 mg/dL — ABNORMAL LOW (ref 8.9–10.3)
Calcium: 8.2 mg/dL — ABNORMAL LOW (ref 8.9–10.3)
Chloride: 115 mmol/L — ABNORMAL HIGH (ref 98–111)
Chloride: 115 mmol/L — ABNORMAL HIGH (ref 98–111)
Creatinine, Ser: 0.62 mg/dL (ref 0.44–1.00)
Creatinine, Ser: 0.8 mg/dL (ref 0.44–1.00)
GFR, Estimated: >60 mL/min (ref >60)
GFR, Estimated: >60 mL/min (ref >60)
Glucose, Bld: 129 mg/dL — ABNORMAL HIGH (ref 70–99)
Glucose, Bld: 109 mg/dL — ABNORMAL HIGH (ref 70–99)
Potassium: 2.9 mmol/L — ABNORMAL LOW (ref 3.5–5.1)
Potassium: 3 mmol/L — ABNORMAL LOW (ref 3.5–5.1)
Sodium: 145 mmol/L (ref 135–145)
Sodium: 144 mmol/L (ref 135–145)

## 2024-03-30 LAB — PHOSPHORUS: Phosphorus: 2.6 mg/dL (ref 2.5–4.6)

## 2024-03-30 LAB — GLUCOSE, CAPILLARY: Glucose-Capillary: 91 mg/dL (ref 70–99)

## 2024-03-30 MED ORDER — LEVETIRACETAM (KEPPRA) 500 MG/5 ML ADULT IV PUSH
250.0000 mg | Freq: Once | INTRAVENOUS | Status: AC
  Administered 2024-03-30: 250 mg via INTRAVENOUS
  Filled 2024-03-30: qty 2.5

## 2024-03-30 MED ORDER — ACETAMINOPHEN 650 MG RE SUPP: 650.0000 mg | Freq: Four times a day (QID) | RECTAL | Status: DC | PRN

## 2024-03-30 MED ORDER — SODIUM CHLORIDE 0.9 % IV SOLN
1.0000 g | INTRAVENOUS | Status: DC
  Administered 2024-03-30: 1 g via INTRAVENOUS

## 2024-03-30 MED ORDER — LEVOTHYROXINE SODIUM 25 MCG PO TABS: 125.0000 ug | ORAL_TABLET | Freq: Every day | ORAL | Status: DC

## 2024-03-30 MED ORDER — VALPROATE SODIUM 100 MG/ML IV SOLN
1000.0000 mg | Freq: Once | INTRAVENOUS | Status: AC
  Administered 2024-03-31: 1000 mg via INTRAVENOUS
  Filled 2024-03-30: qty 10

## 2024-03-30 MED ORDER — APIXABAN 2.5 MG PO TABS
2.5000 mg | ORAL_TABLET | Freq: Two times a day (BID) | ORAL | Status: DC
  Filled 2024-03-30: qty 1

## 2024-03-30 MED ORDER — SODIUM CHLORIDE 0.9 % IV SOLN: 1.0000 g | INTRAVENOUS | Status: DC

## 2024-03-30 MED ORDER — LEVETIRACETAM (KEPPRA) 500 MG/5 ML ADULT IV PUSH: 1500.0000 mg | Freq: Two times a day (BID) | INTRAVENOUS | Status: DC

## 2024-03-30 MED ORDER — SODIUM CHLORIDE 0.9 % IV SOLN
1.0000 g | INTRAVENOUS | Status: DC
  Filled 2024-03-30: qty 10

## 2024-03-30 MED ORDER — POTASSIUM CHLORIDE 10 MEQ/100ML IV SOLN
10.0000 meq | INTRAVENOUS | Status: AC
  Administered 2024-03-30 (×5): 10 meq via INTRAVENOUS
  Filled 2024-03-30 (×5): qty 100

## 2024-03-30 MED ORDER — PERAMPANEL 2 MG PO TABS
8.0000 mg | ORAL_TABLET | Freq: Once | ORAL | Status: DC
  Filled 2024-03-30: qty 1

## 2024-03-30 MED ORDER — MAGNESIUM SULFATE 2 GM/50ML IV SOLN
2.0000 g | Freq: Once | INTRAVENOUS | Status: DC
  Filled 2024-03-30: qty 50

## 2024-03-30 MED ORDER — SODIUM CHLORIDE 0.9 % IV BOLUS
500.0000 mL | Freq: Once | INTRAVENOUS | Status: AC
  Administered 2024-03-30: 500 mL via INTRAVENOUS

## 2024-03-30 MED ORDER — LEVETIRACETAM (KEPPRA) 500 MG/5 ML ADULT IV PUSH
1000.0000 mg | Freq: Two times a day (BID) | INTRAVENOUS | Status: DC
  Administered 2024-03-30 – 2024-04-02 (×6): 1000 mg via INTRAVENOUS
  Filled 2024-03-30 (×6): qty 10

## 2024-03-30 MED ORDER — MAGNESIUM SULFATE 2 GM/50ML IV SOLN
2.0000 g | Freq: Once | INTRAVENOUS | Status: AC
  Administered 2024-03-30: 2 g via INTRAVENOUS

## 2024-03-30 MED ORDER — LORAZEPAM 2 MG/ML IJ SOLN
2.0000 mg | Freq: Once | INTRAMUSCULAR | Status: AC | PRN
  Administered 2024-03-30: 2 mg via INTRAVENOUS
  Filled 2024-03-30: qty 1

## 2024-03-30 MED ORDER — ACETAMINOPHEN 325 MG PO TABS: 650.0000 mg | ORAL_TABLET | Freq: Four times a day (QID) | ORAL | Status: DC | PRN

## 2024-03-30 MED ORDER — POTASSIUM CHLORIDE 10 MEQ/100ML IV SOLN
10.0000 meq | INTRAVENOUS | Status: AC
  Administered 2024-03-30 – 2024-03-31 (×6): 10 meq via INTRAVENOUS
  Filled 2024-03-30 (×5): qty 100

## 2024-03-30 MED ORDER — SODIUM CHLORIDE 0.9 % IV SOLN
200.0000 mg | Freq: Once | INTRAVENOUS | Status: AC
  Administered 2024-03-30: 200 mg via INTRAVENOUS
  Filled 2024-03-30: qty 20

## 2024-03-30 MED ORDER — POTASSIUM CHLORIDE CRYS ER 20 MEQ PO TBCR: 40.0000 meq | EXTENDED_RELEASE_TABLET | ORAL | Status: DC

## 2024-03-30 MED ORDER — LEVETIRACETAM (KEPPRA) 500 MG/5 ML ADULT IV PUSH: 1000.0000 mg | Freq: Two times a day (BID) | INTRAVENOUS | Status: DC

## 2024-03-30 MED ORDER — LORAZEPAM 2 MG/ML IJ SOLN
1.0000 mg | Freq: Once | INTRAMUSCULAR | Status: AC | PRN
  Administered 2024-03-30: 1 mg via INTRAVENOUS
  Filled 2024-03-30: qty 1

## 2024-03-30 MED ORDER — GADOBUTROL 1 MMOL/ML IV SOLN
5.0000 mL | Freq: Once | INTRAVENOUS | Status: AC | PRN
  Administered 2024-03-30: 5 mL via INTRAVENOUS

## 2024-03-30 MED ORDER — LEVETIRACETAM (KEPPRA) 500 MG/5 ML ADULT IV PUSH
1500.0000 mg | Freq: Two times a day (BID) | INTRAVENOUS | Status: DC
  Filled 2024-03-30: qty 15

## 2024-03-30 MED ORDER — SODIUM CHLORIDE 0.9% FLUSH
3.0000 mL | Freq: Two times a day (BID) | INTRAVENOUS | Status: DC
  Administered 2024-03-30 – 2024-04-11 (×18): 3 mL via INTRAVENOUS

## 2024-03-30 MED ORDER — LORAZEPAM 2 MG/ML IJ SOLN
1.0000 mg | Freq: Once | INTRAMUSCULAR | Status: AC
  Administered 2024-03-30: 1 mg via INTRAVENOUS
  Filled 2024-03-30: qty 1

## 2024-03-30 MED ORDER — LORAZEPAM 2 MG/ML IJ SOLN
2.0000 mg | Freq: Once | INTRAMUSCULAR | Status: DC
  Filled 2024-03-30: qty 1

## 2024-03-30 NOTE — Procedures (Signed)
 Routine EEG Report  Alison Lopez is a 68 y.o. female with a history of seizure who is undergoing an EEG to evaluate for seizures.  Report: This EEG was acquired with electrodes placed according to the International 10-20 electrode system (including Fp1, Fp2, F3, F4, C3, C4, P3, P4, O1, O2, T3, T4, T5, T6, A1, A2, Fz, Cz, Pz). The following electrodes were missing or displaced: none.  The occipital dominant rhythm was 7 Hz with intermittent more pronounced slowing and frequent periods of very low amplitude activity (not complete suppression) lasting up to 3 seconds. Overriding beta frequencies are noted. This activity is reactive to stimulation. Drowsiness was manifested by background fragmentation; deeper stages of sleep were identified by K complexes and sleep spindles. There was no focal slowing. There were no interictal epileptiform discharges. There were no electrographic seizures identified. There is prominent artifact throughout the recording in both the EEG leads and EKG lead however this was not felt to limit ability to interpret recording.  Impression and clinical correlation: This EEG was obtained while awake and asleep and is abnormal due to mild-to-moderate diffuse slowing indicative of global cerebral dysfunction. Epileptiform abnormalities were not seen during this recording.  Greg Leaks, MD [facility] [phone]  If 7pm- 7am, please page neurology on call as listed in AMION.

## 2024-03-30 NOTE — Hospital Course (Addendum)
 Alison Lopez is a 68 y.o. female with medical history significant of dementia, spontaneous subdural hematoma in April 2025 (recently hospitalized and discharged to rehab center and came home about a week prior to admission), antiphospholipid syndrome and DVT on Eliquis , hypothyroidism, HLD, chronic thrombocytopenia, brought in by family member for evaluation of new onset of seizure and altered mental status. She has had right-sided weakness since her recent subdural hematoma.  A day prior to admission, her family noticed tremors involving the right arm and right leg.  Family also noticed that she had become more confused.   She was admitted to the hospital for seizure.  In the hospital, she had recurrent seizure episodes requiring treatment with IV Ativan.  She was also treated with IV Keppra.  Workup was revealed acute UTI and she was started on IV ceftriaxone .   Assessment and Plan:    Recurrent seizures AMS - seems to remain postictal but able to follow commands on arrival to [facility]; still has ongoing right arm and face twitching which she was having at [facility] prior to transfer - neuro informed of transfer - giving ativan 2 mg x 1 after discussion with neurology - continue Keppra 1500 mg BID (dose inc at [facility] but last dose around 8am on 5/21) - continuous EEG to be started   Recent spontaneous SDH New subacute CVA? -  CT head showed decreasing size of SDH (3 mm down from 7 mm) - eliquis  was on hold since admission to [facility] - MRI brain and MRV head do mention multiple scattered infarcts involving cortex and subcortical white matter (varying ages which appear subacute?) - Holding Eliquis  until evaluated further by neurology before resuming - given B/L infarct distribution, will check TTE to start  Sepsis 2/2 acute cystitis - 30K E. coli growing on 03/29/2024 culture -Continue Rocephin  (started 5/19) and follow-up sensitivities  Antiphospholipid syndrome - On chronic Eliquis  at  home  Thrombocytopenia - Baseline appears to be around 50 - 60k - remains at baseline  Hypokalemia - Recheck BMP on arrival to [facility]  Hypomagnesemia - Replete as needed   Diarrhea -  Imodium as needed.   - GI panel was negative.  Respiratory viral panel negative

## 2024-03-30 NOTE — Progress Notes (Signed)
 Neurology progress note  S: Patient had 2 breakthrough seizures yesterday and 1 today, aborted with ativan. She is currently post-ictal.  Exam  Vitals:   03/30/24 1706  BP: 139/64  Pulse: 74  Resp: 18  SpO2: 98%    Gen: patient lying in bed, NAD CV: extremities appear well-perfused Resp: normal WOB  Neurologic exam performed after patient had seizure and ativan MS: somnolent, arousable to sternal rub, states name is Amyiah, unable to answer other orientation questions Speech: moderate dysarthria CN: PERRL, blinks to threat bilat, EOMI, sensation intact, face symmetric, hearing intact to voice Motor & sensory: does not move extremities to command, withdraws to pain in all 4 extremities Sensory: SILT Coordination: UTA Gait: deferred  Imaging (all personally reviewed)   MRI brain MRV  Multiple scattered foci of infarct involving the cortex and subcortical white matter in the bilateral occipital lobes, parietal lobes, and left temporal occipital lobes. Infarcts likely of varying ages some of which appear subacute.   Redemonstrated subdural collection over the left cerebral convexity measuring 3 mm in thickness. Dural thickening and enhancement over the left cerebral convexities likely reactive.   No evidence of venous sinus thrombosis.  No significant stenosis.   Moderate chronic microvascular ischemic changes. Multiple remote infarcts as above.  rEEG  A/P: 68 yo patient with hx dementia and recent spontaneous SDH in March s/p burr hole evacuation who presented for new onset complex partial seizures. Head CT showed further decrease size of the previously seen mixed density left subdural hematoma, now 3 mm in thickness compared to 7 mm previously.  Eliquis  2.5mg  bid held on admission 2/2 thrombocytopenia. She has had at least 4 breakthrough seizures in the past 24 hours despite escalating doses of keppra. Of note she is on ceftriaxone  for UTI. Seizures persist despite  escalating keppra dose to current 1500mg  bid and between seizures her mental status fluctuates, which raises concern for possible nonconvulsive seizures as well. Recommend transfer to [facility] for cEEG and further mgmt.  - Transfer to [facility] for cEEG - Continue keppra 1500mg  bid for now - Please notify [facility] neurologist upon patient arrival, defer further guidance to them  Greg Leaks, MD [facility] [phone]  If 7pm- 7am, please page neurology on call as listed in AMION.

## 2024-03-30 NOTE — Discharge Summary (Addendum)
 Physician Discharge Summary   Patient: Alison Lopez MRN: [id number] DOB: 09/07/1956  Admit date:     03/28/2024  Discharge date: 03/30/24  Discharge Physician: Sheril Dines   PCP: Alexander Anes, MD   Recommendations at discharge:    Transfer to [facility] for continuous EEG  Discharge Diagnoses: Principal Problem:   Sepsis due to gram-negative UTI [facility]) Active Problems:   Partial seizure (HCC)   Seizure (HCC)  Resolved Problems:   * No resolved hospital problems. *  Hospital Course:  Alison Lopez is a 68 y.o. female with medical history significant of dementia, spontaneous subdural hematoma in April 2025 (recently hospitalized and discharged to rehab center and came home about a week prior to admission), antiphospholipid syndrome and DVT on Eliquis , hypothyroidism, HLD, chronic thrombocytopenia, brought in by family member for evaluation of new onset of seizure and altered mental status. She has had right-sided weakness since her recent subdural hematoma.  A day prior to admission, her family noticed tremors involving the right arm and right leg.  Family also noticed that she had become more confused.  She was admitted to the hospital for seizure.  In the hospital, she had recurrent seizure episodes requiring treatment with IV Ativan.  She was also treated with IV Keppra.  Workup was revealed acute UTI and she was started on IV ceftriaxone .  Assessment and Plan:   Recurrent seizures, altered mental status, postictal state: Required treatment with IV Ativan.  She was started on IV Keppra.  She was evaluated by neurologist who recommended transfer to [facility] for continuous EEG because of recurrent seizures and altered mental status Continue seizure precautions. Please consult the neurologist on-call when patient arrives.   Sepsis secondary to acute UTI: Urine culture growing gram-negative rods.  Follow-up culture and sensitivity  report.   Hypokalemia: Replete with IV potassium and monitor levels Hypomagnesemia: Improved   Diarrhea: Imodium as needed.  GI panel was negative.  Respiratory viral panel negative.   Recent spontaneous subdural hematoma: CT head showed decreasing size of SDH   Complication include advanced dementia, history of antiphospholipid syndrome on Eliquis , chronic thrombocytopenia   Case is discussed with Dr. Doretta Gant, neurologist  and Florina Husbands, RN, at the bedside. Plan of care was discussed with [name], daughter, over the phone.  She is agreeable with transfer.       Consultants: Neurologist Procedures performed: None Disposition: [facility] Diet recommendation:  NPO   DISCHARGE MEDICATION: Allergies as of 03/30/2024       Reactions   Morphine And Codeine Anaphylaxis   Montelukast     Other reaction(s): Other (See Comments) Nightmares   Antihistamines, Diphenhydramine -type Nausea Only   Aspirin Other (See Comments)   Patient on blood thinners and was told not to take   Benadryl  [diphenhydramine  Hcl] Hives   Codeine Other (See Comments)   AMS        Medication List     TAKE these medications    acetaminophen  325 MG tablet Commonly known as: TYLENOL  Take 2 tablets (650 mg total) by mouth every 6 (six) hours as needed for mild pain (pain score 1-3) (or Fever >/= 101).   apixaban  2.5 MG Tabs tablet Commonly known as: ELIQUIS  Take 1 tablet (2.5 mg total) by mouth 2 (two) times daily.   cefTRIAXone  1 g in sodium chloride  0.9 % 100 mL Inject 1 g into the vein daily.   cyanocobalamin  1000 MCG tablet Commonly known as: VITAMIN B12 Take 1 tablet (  1,000 mcg total) by mouth daily.   donepezil  10 MG tablet Commonly known as: ARICEPT  Take 1 tablet (10 mg total) by mouth at bedtime.   levETIRAcetam 500 MG/5ML undiluted injection Commonly known as: KEPPRA Inject 15 mLs (1,500 mg total) into the vein every 12 (twelve) hours.   levothyroxine  125 MCG  tablet Commonly known as: SYNTHROID  Take 1 tablet (125 mcg total) by mouth daily before breakfast.   melatonin 5 MG Tabs Take 1 tablet (5 mg total) by mouth at bedtime.   methylphenidate  10 MG tablet Commonly known as: RITALIN  Take 1 tablet (10 mg total) by mouth 2 (two) times daily with breakfast and lunch.   rosuvastatin  20 MG tablet Commonly known as: CRESTOR  Take 1 tablet (20 mg total) by mouth daily.   sertraline  25 MG tablet Commonly known as: ZOLOFT  Take 1 tablet (25 mg total) by mouth daily.   vitamin C  1000 MG tablet Take 1 tablet (1,000 mg total) by mouth daily.   Vitamin D  (Ergocalciferol ) 1.25 MG (50000 UNIT) Caps capsule Commonly known as: DRISDOL  Take 1 capsule (50,000 Units total) by mouth every Friday.        Discharge Exam: Filed Weights   03/28/24 2130 03/28/24 2251  Weight: 46.7 kg 46.7 kg   GEN: NAD SKIN: Warm and dry EYES: No pallor or icterus  ENT: MMM CV: RRR PULM: CTA B ABD: soft, ND, NT, +BS CNS: Lethargic and difficult to arouse EXT: No edema   Condition at discharge: stable  The results of significant diagnostics from this hospitalization (including imaging, microbiology, ancillary and laboratory) are listed below for reference.   Imaging Studies: CT Head Wo Contrast Result Date: 03/28/2024 CLINICAL DATA:  Right side tremors EXAM: CT HEAD WITHOUT CONTRAST TECHNIQUE: Contiguous axial images were obtained from the base of the skull through the vertex without intravenous contrast. RADIATION DOSE REDUCTION: This exam was performed according to the departmental dose-optimization program which includes automated exposure control, adjustment of the mA and/or kV according to patient size and/or use of iterative reconstruction technique. 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 Further decreased size of the previously seen mixed density left subdural hematoma, now 3 mm in thickness compared to 7 mm  previously. No mass effect or midline shift. No new hemorrhage or infarction. There is atrophy and chronic small vessel disease changes. Vascular: No hyperdense vessel or unexpected calcification. Skull: No acute calvarial abnormality. Sinuses/Orbits: No acute findings Other: None IMPRESSION: Further decreased size of the previously seen mixed density left subdural hematoma, now 3 mm in thickness compared to 7 mm previously. 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 Atrophy, chronic microvascular disease. No acute intracranial abnormality. Electronically Signed   By: Janeece Mechanic M.D.   On: 03/28/2024 23:30   DG Chest Port 1 View Result Date: 03/28/2024 CLINICAL DATA:  Questionable sepsis EXAM: PORTABLE CHEST 1 VIEW COMPARISON:  Chest x-ray 02/13/2024 FINDINGS: The heart size and mediastinal contours are within normal limits. Both lungs are clear. The visualized skeletal structures are unremarkable. IMPRESSION: No active disease. Electronically Signed   By: Tyron Gallon M.D.   On: 03/28/2024 23:28   CT HEAD WO CONTRAST ( ) Result Date: 03/01/2024 CLINICAL DATA:  Provided history: Subdural hematoma. EXAM: CT HEAD WITHOUT CONTRAST TECHNIQUE: Contiguous axial images were obtained from the base of the skull through the vertex without intravenous contrast. RADIATION DOSE REDUCTION: This exam was performed according to the departmental dose-optimization program which includes automated exposure control, adjustment of the mA and/or kV  according to patient size and/or use of iterative reconstruction technique. COMPARISON:  Head CT 02/16/2024. FINDINGS: Brain: Generalized cerebral atrophy. The subdural hematoma demonstrated along the right cerebral convexity on the prior head CT of 02/16/2024 has essentially resolved. The mixed density subdural hematoma along the left cerebral convexity persists, but has decreased in size and now measures up to 7 mm in thickness (previously 9-10 mm). Persistent although decreased hyperdense  components within this hematoma. Foci of chronic encephalomalacia/gliosis again demonstrated within the bilateral frontal, parietal and occipital lobes. Background patchy and ill-defined hypoattenuation within the cerebral white matter, nonspecific but compatible chronic small vessel ischemic disease. 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 No acute demarcated cortical infarct. No evidence of an intracranial mass. No midline shift. Vascular: No hyperdense vessel.  Atherosclerotic calcifications. Skull: No acute calvarial fracture or aggressive osseous lesion. Burr holes within the frontoparietal calvarium bilaterally. Sinuses/Orbits: No mass or acute finding within the imaged orbits. 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 No significant paranasal sinus disease at the imaged levels. IMPRESSION: 1. A mixed density subdural hematoma along the left cerebral convexity persists, but has decreased in size since 02/16/2024, now measuring up to 7 mm in thickness. No midline shift. 2. The mixed density subdural hematoma previously demonstrated along the right cerebral convexity has essentially resolved. 3. Background parenchymal atrophy, chronic small vessel ischemic disease and chronic infarcts, as described. Electronically Signed   By: Bascom Lily D.O.   On: 03/01/2024 13:37    Microbiology: Results for orders placed or performed during the hospital encounter of 03/28/24  Resp panel by RT-PCR (RSV, Flu A&B, Covid) Anterior Nasal Swab     Status: None   Collection Time: 03/28/24 11:46 PM   Specimen: Anterior Nasal Swab  Result Value Ref Range Status   SARS Coronavirus 2 by RT PCR NEGATIVE NEGATIVE Final    Comment: (NOTE) SARS-CoV-2 target nucleic acids are NOT DETECTED.  The SARS-CoV-2 RNA is generally detectable in upper respiratory specimens during the acute phase of infection. The lowest concentration of SARS-CoV-2 viral copies this assay can detect is 138 copies/mL.  A negative result does not preclude SARS-Cov-2 infection and should not be used as the sole basis for treatment or other patient management decisions. A negative result may occur with  improper specimen collection/handling, submission of specimen other than nasopharyngeal swab, presence of viral mutation(s) within the areas targeted by this assay, and inadequate number of viral copies(<138 copies/mL). A negative result must be combined with clinical observations, patient history, and epidemiological information. The expected result is Negative.  Fact Sheet for Patients:  BloggerCourse.com  Fact Sheet for Healthcare Providers:  SeriousBroker.it  This test is no t yet approved or cleared by the [place]  FDA and  has been authorized for detection and/or diagnosis of SARS-CoV-2 by FDA under an Emergency Use Authorization (EUA). This EUA will remain  in effect (meaning this test can be used) for the duration of the COVID-19 declaration under Section 564(b)(1) of the Act, 21 U.S.C.section 360bbb-3(b)(1), unless the authorization is terminated  or revoked sooner.       Influenza A by PCR NEGATIVE NEGATIVE Final   Influenza B by PCR NEGATIVE NEGATIVE Final    Comment: (NOTE) The Xpert Xpress SARS-CoV-2/FLU/RSV plus assay is intended as an aid in the diagnosis of influenza from Nasopharyngeal swab specimens and should not be used as a sole basis for treatment. Nasal washings and aspirates are unacceptable for Xpert Xpress SARS-CoV-2/FLU/RSV testing.  Fact Sheet for  Patients: BloggerCourse.com  Fact Sheet for Healthcare Providers: SeriousBroker.it  This test is not yet approved or cleared by the [place]  FDA and has been authorized for detection and/or diagnosis of SARS-CoV-2 by FDA under an Emergency Use Authorization (EUA). This EUA will remain in effect (meaning this test  can be used) for the duration of the COVID-19 declaration under Section 564(b)(1) of the Act, 21 U.S.C. section 360bbb-3(b)(1), unless the authorization is terminated or revoked.     Resp Syncytial Virus by PCR NEGATIVE NEGATIVE Final    Comment: (NOTE) Fact Sheet for Patients: BloggerCourse.com  Fact Sheet for Healthcare Providers: SeriousBroker.it  This test is not yet approved or cleared by the [place]  FDA and has been authorized for detection and/or diagnosis of SARS-CoV-2 by FDA under an Emergency Use Authorization (EUA). This EUA will remain in effect (meaning this test can be used) for the duration of the COVID-19 declaration under Section 564(b)(1) of the Act, 21 U.S.C. section 360bbb-3(b)(1), unless the authorization is terminated or revoked.  Performed at [facility], [address] [place], [place] [address]   Blood Culture (routine x 2)     Status: None (Preliminary result)   Collection Time: 03/28/24 11:52 PM   Specimen: BLOOD LEFT ARM  Result Value Ref Range Status   Specimen Description BLOOD LEFT ARM  Final   Special Requests   Final    BOTTLES DRAWN AEROBIC ONLY Blood Culture results may not be optimal due to an inadequate volume of blood received in culture bottles   Culture   Final    NO GROWTH 2 DAYS Performed at [facility], [address], [place], [place] [address]    Report Status PENDING  Incomplete  Urine Culture     Status: Abnormal (Preliminary result)   Collection Time: 03/29/24  1:10 AM   Specimen: Urine, Random  Result Value Ref Range Status   Specimen Description   Final    URINE, RANDOM Performed at [facility], [address] [place], [place] [address]    Special Requests   Final    NONE Reflexed from [phone] Performed at [facility], [address], [place], [place] [address]    Culture (A)  Final    30,000 COLONIES/mL GRAM NEGATIVE  RODS SUSCEPTIBILITIES TO FOLLOW Performed at [facility], [address], [place], [place] [address]    Report Status PENDING  Incomplete  Respiratory (~20 pathogens) panel by PCR     Status: None   Collection Time: 03/29/24  8:46 AM   Specimen: Nasopharyngeal Swab; Respiratory  Result Value Ref Range Status   Adenovirus NOT DETECTED NOT DETECTED Final   Coronavirus 229E NOT DETECTED NOT DETECTED Final    Comment: (NOTE) The Coronavirus on the Respiratory Panel, DOES NOT test for the novel  Coronavirus (2019 nCoV)    Coronavirus HKU1 NOT DETECTED NOT DETECTED Final   Coronavirus NL63 NOT DETECTED NOT DETECTED Final   Coronavirus OC43 NOT DETECTED NOT DETECTED Final   Metapneumovirus NOT DETECTED NOT DETECTED Final   Rhinovirus / Enterovirus NOT DETECTED NOT DETECTED Final   Influenza A NOT DETECTED NOT DETECTED Final   Influenza B NOT DETECTED NOT DETECTED Final   Parainfluenza Virus 1 NOT DETECTED NOT DETECTED Final   Parainfluenza Virus 2 NOT DETECTED NOT DETECTED Final   Parainfluenza Virus 3 NOT DETECTED NOT DETECTED Final   Parainfluenza Virus 4 NOT DETECTED NOT DETECTED Final   Respiratory Syncytial Virus NOT DETECTED NOT  DETECTED Final   Bordetella pertussis NOT DETECTED NOT DETECTED Final   Bordetella Parapertussis NOT DETECTED NOT DETECTED Final   Chlamydophila pneumoniae NOT DETECTED NOT DETECTED Final   Mycoplasma pneumoniae NOT DETECTED NOT DETECTED Final    Comment: Performed at [facility], [address], [place], [place] [address]  Gastrointestinal Panel by PCR , Stool     Status: None   Collection Time: 03/29/24  8:46 AM   Specimen: Nasopharyngeal Swab; Stool  Result Value Ref Range Status   Campylobacter species NOT DETECTED NOT DETECTED Final   Plesimonas shigelloides NOT DETECTED NOT DETECTED Final   Salmonella species NOT DETECTED NOT DETECTED Final   Yersinia enterocolitica NOT DETECTED NOT DETECTED Final   Vibrio species NOT DETECTED NOT  DETECTED Final   Vibrio cholerae NOT DETECTED NOT DETECTED Final   Enteroaggregative E coli (EAEC) NOT DETECTED NOT DETECTED Final   Enteropathogenic E coli (EPEC) NOT DETECTED NOT DETECTED Final   Enterotoxigenic E coli (ETEC) NOT DETECTED NOT DETECTED Final   Shiga like toxin producing E coli (STEC) NOT DETECTED NOT DETECTED Final   Shigella/Enteroinvasive E coli (EIEC) NOT DETECTED NOT DETECTED Final   Cryptosporidium NOT DETECTED NOT DETECTED Final   Cyclospora cayetanensis NOT DETECTED NOT DETECTED Final   Entamoeba histolytica NOT DETECTED NOT DETECTED Final   Giardia lamblia NOT DETECTED NOT DETECTED Final   Adenovirus F40/41 NOT DETECTED NOT DETECTED Final   Astrovirus NOT DETECTED NOT DETECTED Final   Norovirus GI/GII NOT DETECTED NOT DETECTED Final   Rotavirus A NOT DETECTED NOT DETECTED Final   Sapovirus (I, II, IV, and V) NOT DETECTED NOT DETECTED Final    Comment: Performed at [facility], [address] [place], [place] [address]    Labs: CBC: Recent Labs  Lab 03/28/24 2133 03/30/24 0545  WBC 7.6 8.9  NEUTROABS 5.8  --   HGB 11.4* 10.2*  HCT 33.5* 31.1*  MCV 83.8 84.7  PLT 59* 54*   Basic Metabolic Panel: Recent Labs  Lab 03/28/24 2133 03/29/24 0738 03/30/24 0544 03/30/24 0545  NA 135  --   --  145  K 2.8*  --   --  2.9*  CL 104  --   --  115*  CO2 22  --   --  22  GLUCOSE 112*  --   --  129*  BUN 15  --   --  <5*  CREATININE 0.69  --   --  0.62  CALCIUM  8.4*  --   --  7.8*  MG 1.8 1.5* 1.7  --   PHOS 3.5  --  2.6  --    Liver Function Tests: Recent Labs  Lab 03/28/24 2133  AST 19  ALT 10  ALKPHOS 54  BILITOT 0.7  PROT 6.2*  ALBUMIN 3.0*   CBG: Recent Labs  Lab 03/29/24 0227 03/29/24 0427 03/29/24 1651  GLUCAP 82 77 137*    Discharge time spent: greater than 30 minutes.  Signed: Sheril Dines, MD [facility] 03/30/2024

## 2024-03-30 NOTE — Progress Notes (Signed)
 SLP Cancellation Note  Patient Details Name: Alison Lopez MRN: [id number] DOB: [birth year]   Cancelled treatment:       Reason Eval/Treat Not Completed: Patient not medically ready (chart reviewed; consulted NSG)  Per chart notes, Neurologist has recommended pt transfer to [facility] for continuous EEG because of recurrent seizures and altered mental status. Pt remains NPO; oral care for hygiene and stimulation of swallowing when appropriate.  ST services will continue to monitor pt's medical status and appropriateness for BSE.     Darla Edward, MS, CCC-SLP Speech Language Pathologist Rehab Services; [facility] [phone] (ascom) Alison Lopez 03/30/2024, 1:34 PM

## 2024-03-30 NOTE — Progress Notes (Signed)
 OT Cancellation Note  Patient Details Name: KHADEEJA ELDEN MRN: [id number] DOB: 1956/06/07   Cancelled Treatment:    Reason Eval/Treat Not Completed: Patient not medically ready. Patient continuing to have seizures, is now transferring to [facility] for continuous EEG monitoring. OT will complete orders at this time, please re-consult when medically appropriate.   Jacquelin Krajewski L. Zayden Hahne, OTR/L  03/30/24, 12:41 PM

## 2024-03-30 NOTE — Progress Notes (Addendum)
 She continues to have focal L hemispheric seizures. I evaluated her at the bedside, unfortunately, too somnolent to take Fycompa. Will instead load with Vimpat 200mg  IV once. Will recheck LTM EEG once Vimpat load is given.   Update: 2:30 AM Mental status continues to decline. Earlier given Valproic acid load. She continues to be in focal status. With mentation declining, I am going to give her some phenobarb. Will monitor her in the ICU. I dont think she needs to be intubated right now but needs closer monitoring of her airway as we escalate AEDs further and as mentation declines secondary to focal status.  Update: 5:20 AM  She declined further after phenobarb and was intubated in the ICU. She is now on propofol . With her airway secured, will aim for burst suppression.   This patient is critically ill and at significant risk of neurological worsening, death and care requires constant monitoring of vital signs, hemodynamics,respiratory and cardiac monitoring, neurological assessment, discussion with family, other specialists and medical decision making of high complexity. I spent 40 minutes of neurocritical care time  in the care of  this patient. This was time spent independent of any time provided by nurse practitioner or PA.  Alison Lopez [facility] 03/31/2024  6:48 AM  Alison Lopez [facility]

## 2024-03-30 NOTE — Progress Notes (Signed)

## 2024-03-30 NOTE — Progress Notes (Addendum)
STAT LTM EEG hooked up and running - no initial skin breakdown - push button tested - [facility] monitoring. CT leads used

## 2024-03-30 NOTE — Progress Notes (Signed)
 PT Cancellation Note  Patient Details Name: Alison Lopez MRN: [id number] DOB: March 09, 1956   Cancelled Treatment:    Reason Eval/Treat Not Completed: Medical issues which prohibited therapy Patient continuing to have seizures, is now transferring to [place] for continuous EEG monitoring. Signing off.    Sloan Galentine 03/30/2024, 12:23 PM

## 2024-03-30 NOTE — H&P (Signed)
 History and Physical    Alison Lopez  FAO:[id number]  DOB: 14-Jan-1956  DOA: 03/30/2024  PCP: Alexander Anes, MD Patient coming from: [facility]  Chief Complaint: seizures  HPI:  Alison Lopez is a 68 y.o. female with medical history significant of dementia, spontaneous subdural hematoma in April 2025 (recently hospitalized and discharged to rehab center and came home about a week prior to admission), antiphospholipid syndrome and DVT on Eliquis , hypothyroidism, HLD, chronic thrombocytopenia, brought in by family member for evaluation of new onset of seizure and altered mental status. She has had right-sided weakness since her recent subdural hematoma.  A day prior to admission, her family noticed tremors involving the right arm and right leg.  Family also noticed that she had become more confused.   She was admitted to the hospital for seizure.  In the hospital, she had recurrent seizure episodes requiring treatment with IV Ativan.  She was also treated with IV Keppra.  Workup was revealed acute UTI and she was started on IV ceftriaxone .   Assessment and Plan:    Recurrent seizures AMS - seems to remain postictal but able to follow commands on arrival to [facility]; still has ongoing right arm and face twitching which she was having at [facility] prior to transfer - neuro informed of transfer - giving ativan 2 mg x 1 after discussion with neurology - continue Keppra 1500 mg BID (dose inc at [facility] but last dose around 8am on 5/21) - continuous EEG to be started   Recent spontaneous SDH New subacute CVA? -  CT head showed decreasing size of SDH (3 mm down from 7 mm) - eliquis  was on hold since admission to [facility] - MRI brain and MRV head do mention multiple scattered infarcts involving cortex and subcortical white matter (varying ages which appear subacute?) - Holding Eliquis  until evaluated further by neurology before resuming - given B/L infarct distribution, will check TTE to start  Sepsis  2/2 acute cystitis - 30K E. coli growing on 03/29/2024 culture -Continue Rocephin  (started 5/19) and follow-up sensitivities  Antiphospholipid syndrome - On chronic Eliquis  at home  Thrombocytopenia - Baseline appears to be around 50 - 60k - remains at baseline  Hypokalemia - Recheck BMP on arrival to [facility]  Hypomagnesemia - Replete as needed   Diarrhea -  Imodium as needed.   - GI panel was negative.  Respiratory viral panel negative  I have personally briefly reviewed patient's old medical records in [facility] and discussed patient with the ER provider when appropriate/indicated.      Code Status:     Code Status: Full Code  DVT Prophylaxis:   SCDs Start: 03/30/24 1728   Anticipated disposition is to: TBD  History: Past Medical History:  Diagnosis Date   Anxiety    Arthritis    Collagen vascular disease (HCC)    COPD (chronic obstructive pulmonary disease) (HCC)    DDD (degenerative disc disease), lumbar    Dementia (HCC)    Depression    Diabetes mellitus without complication (HCC)    DVT (deep venous thrombosis) (HCC)    Fibromyalgia    GERD (gastroesophageal reflux disease)    Graves' disease with exophthalmos    HLD (hyperlipidemia)    Hypertension    Hypothyroidism    Lupus    Osteoporosis     Past Surgical History:  Procedure Laterality Date   ABDOMINAL HYSTERECTOMY     BURR HOLE Bilateral 01/30/2024   Procedure: CREATION, CRANIAL BURR HOLE,  SUBDURAL HEMATOMA EVACUATIOIN;  Surgeon: Jodeen Munch, MD;  Location: [facility] ORS;  Service: Neurosurgery;  Laterality: Bilateral;  need ventriculostomy x 2   COLONOSCOPY WITH PROPOFOL  N/A 06/11/2021   Procedure: COLONOSCOPY WITH PROPOFOL ;  Surgeon: Shane Darling, MD;  Location: [facility] ENDOSCOPY;  Service: Endoscopy;  Laterality: N/A;  prefers afternoon Eliquis    COLONOSCOPY WITH PROPOFOL  N/A 12/19/2022   Procedure: COLONOSCOPY WITH PROPOFOL ;  Surgeon: Shane Darling, MD;  Location: [facility] ENDOSCOPY;   Service: Endoscopy;  Laterality: N/A;   ESOPHAGOGASTRODUODENOSCOPY N/A 06/11/2021   Procedure: ESOPHAGOGASTRODUODENOSCOPY (EGD);  Surgeon: Shane Darling, MD;  Location: [facility] ENDOSCOPY;  Service: Endoscopy;  Laterality: N/A;   EYE SURGERY Right    IVC FILTER REMOVAL N/A 07/21/2019   Procedure: IVC FILTER REMOVAL;  Surgeon: Celso College, MD;  Location: [facility] INVASIVE CV LAB;  Service: Cardiovascular;  Laterality: N/A;   LOWER EXTREMITY INTERVENTION Right 04/22/2019   Procedure: IVC Filter Insertion with Right Lower Extremity Venous Lysis;  Surgeon: Celso College, MD;  Location: [facility] INVASIVE CV LAB;  Service: Cardiovascular;  Laterality: Right;   OOPHORECTOMY       reports that she quit smoking about 23 years ago. Her smoking use included cigarettes. She started smoking about 40 years ago. She has a 17 pack-year smoking history. She has never used smokeless tobacco. She reports that she does not currently use alcohol. She reports that she does not use drugs.  Allergies  Allergen Reactions   Morphine And Codeine Anaphylaxis   Montelukast      Other reaction(s): Other (See Comments) Nightmares   Antihistamines, Diphenhydramine -Type Nausea Only   Aspirin Other (See Comments)    Patient on blood thinners and was told not to take   Benadryl  [Diphenhydramine  Hcl] Hives   Codeine Other (See Comments)    AMS    Family History  Problem Relation Age of Onset   Stomach cancer Mother    Heart attack Father    Breast cancer Neg Hx     Home Medications: Prior to Admission medications   Medication Sig Start Date End Date Taking? Authorizing Provider  acetaminophen  (TYLENOL ) 325 MG tablet Take 2 tablets (650 mg total) by mouth every 6 (six) hours as needed for mild pain (pain score 1-3) (or Fever >/= 101). 03/03/24   Angiulli, Everlyn Hockey, PA-C  apixaban  (ELIQUIS ) 2.5 MG TABS tablet Take 1 tablet (2.5 mg total) by mouth 2 (two) times daily. 03/14/24   [name], MD  ascorbic acid  (VITAMIN C ) 1000 MG  tablet Take 1 tablet (1,000 mg total) by mouth daily. 03/03/24   Angiulli, Everlyn Hockey, PA-C  cefTRIAXone  1 g in sodium chloride  0.9 % 100 mL Inject 1 g into the vein daily. 03/30/24   Sheril Dines, MD  cyanocobalamin  (VITAMIN B12) 1000 MCG tablet Take 1 tablet (1,000 mcg total) by mouth daily. 03/14/24   [name], MD  donepezil  (ARICEPT ) 10 MG tablet Take 1 tablet (10 mg total) by mouth at bedtime. 03/03/24   Angiulli, Everlyn Hockey, PA-C  ergocalciferol  (VITAMIN D2) 1.25 MG (50000 UT) capsule Take 1 capsule (50,000 Units total) by mouth every Friday. 03/04/24   Angiulli, Everlyn Hockey, PA-C  levETIRAcetam (KEPPRA) 500 MG/5ML undiluted injection Inject 15 mLs (1,500 mg total) into the vein every 12 (twelve) hours. 03/30/24   Sheril Dines, MD  levothyroxine  (SYNTHROID ) 125 MCG tablet Take 1 tablet (125 mcg total) by mouth daily before breakfast. 03/03/24   Angiulli, Everlyn Hockey, PA-C  melatonin 5 MG TABS Take 1  tablet (5 mg total) by mouth at bedtime. 03/03/24   Angiulli, Everlyn Hockey, PA-C  methylphenidate  (RITALIN ) 10 MG tablet Take 1 tablet (10 mg total) by mouth 2 (two) times daily with breakfast and lunch. 03/15/24   Jodi Munroe, NP  rosuvastatin  (CRESTOR ) 20 MG tablet Take 1 tablet (20 mg total) by mouth daily. 03/03/24   Angiulli, Everlyn Hockey, PA-C  sertraline  (ZOLOFT ) 25 MG tablet Take 1 tablet (25 mg total) by mouth daily. 03/03/24   Angiulli, Everlyn Hockey, PA-C    Review of Systems:  Review of Systems  Unable to perform ROS: Mental status change    Physical Exam:  There were no vitals filed for this visit. Physical Exam Constitutional:      Comments: Mumbling with very slow mentation but able to follow commands in all 4 extremities and spit tongue out  HENT:     Head: Normocephalic and atraumatic.  Eyes:     Comments: R pupil slightly larger than left (3 mm vs 2 mm) and sluggish to react for both  Cardiovascular:     Rate and Rhythm: Normal rate and regular rhythm.  Pulmonary:     Effort: Pulmonary effort  is normal. No respiratory distress.     Breath sounds: No wheezing.  Abdominal:     General: Bowel sounds are normal. There is no distension.     Palpations: Abdomen is soft.     Tenderness: There is no abdominal tenderness.  Musculoskeletal:     Cervical back: Normal range of motion and neck supple.     Right lower leg: No edema.     Left lower leg: No edema.  Skin:    General: Skin is warm and dry.  Neurological:     Comments: Withdraws from pain in all 4 extremities and does follow commands in all 4 extremities with very obvious delay; twitching noted in right arm and right face but talking and following commands during this      Labs on Admission:  I have personally reviewed following labs and imaging studies Results for orders placed or performed during the hospital encounter of 03/30/24 (from the past 24 hours)  Glucose, capillary     Status: None   Collection Time: 03/30/24  5:17 PM  Result Value Ref Range   Glucose-Capillary 91 70 - 99 mg/dL     Radiological Exams on Admission: MR BRAIN W WO CONTRAST Result Date: 03/30/2024 CLINICAL DATA:  Seizure, new onset. Seizure like activity, twitching of right face and shoulder. Concern for dural venous sinus thrombosis. History of antiphospholipid syndrome. EXAM: MRI HEAD WITH AND WITHOUT CONTRAST MRV HEAD WITH AND WITHOUT CONTRAST TECHNIQUE: Multiplanar, multi-echo pulse sequences of the brain and surrounding structures were acquired without intravenous contrast. Angiographic images of the intracranial venous structures were acquired using MRV technique without intravenous contrast. COMPARISON:  CT head 03/28/2024. FINDINGS: MRI HEAD WITHOUT CONTRAST Brain: Remote infarcts in the bilateral occipital lobes, right greater than left with associated surrounding gliosis. Few additional areas of cortical signal abnormality in the bilateral parietal lobes which may reflect areas of remote infarct. There are scattered areas of diffusion signal  abnormality some of which demonstrate ADC hypointensity involving the cortex of the bilateral occipital lobes slightly more pronounced on the left. Additional scattered areas of diffusion signal abnormality involving the bilateral parietal cortex. Additional restricted diffusion involving the left temporal occipital lobes. Redemonstrated subdural collection over the left cerebral convexity which measures approximately 3 mm in thickness similar to recent prior  CT. No significant associated mass effect. No midline shift. T2/FLAIR hyperintensity in the periventricular and subcortical white matter. Additional remote infarcts in the bilateral corona radiata and right frontal subcortical white matter. Mild parenchymal volume loss. There are numerous scattered foci of susceptibility within the bilateral cerebral hemispheres with a few distal foci within the basal ganglia, brainstem, and cerebellum. Mild dural thickening and enhancement over the left cerebral convexity which may be reactive in the setting of subdural hematoma. No abnormal parenchymal enhancement. Ventricles: Prominence of the lateral ventricles suggestive of underlying parenchymal volume loss. Vascular: Skull base flow voids are visualized. Skull and upper cervical spine: No focal abnormality. Sinuses/Orbits: Orbits are symmetric. Mucosal thickening in the right maxillary sinus. Other: Mastoid air cells are clear. MR VENOGRAM WITHOUT CONTRAST The superior sagittal sinus is patent. Visualized cortical veins along the convexity are unremarkable. The internal cerebral veins, vein of Galen, and straight sinus are patent. Normal appearance of the confluence of the sinuses. The left transverse and sigmoid sinuses are dominant. The left transverse and sigmoid sinus appears patent without stenosis or evidence of thrombosis. Right transverse and sigmoid sinus also appears patent without significant stenosis or evidence of thrombosis. IMPRESSION: Multiple scattered  foci of infarct involving the cortex and subcortical white matter in the bilateral occipital lobes, parietal lobes, and left temporal occipital lobes. Infarcts likely of varying ages some of which appear subacute. Redemonstrated subdural collection over the left cerebral convexity measuring 3 mm in thickness. Dural thickening and enhancement over the left cerebral convexities likely reactive. No evidence of venous sinus thrombosis.  No significant stenosis. Moderate chronic microvascular ischemic changes. Multiple remote infarcts as above. Electronically Signed   By: Denny Flack M.D.   On: 03/30/2024 16:34   MR MRV HEAD W WO CONTRAST Result Date: 03/30/2024 CLINICAL DATA:  Seizure, new onset. Seizure like activity, twitching of right face and shoulder. Concern for dural venous sinus thrombosis. History of antiphospholipid syndrome. EXAM: MRI HEAD WITH AND WITHOUT CONTRAST MRV HEAD WITH AND WITHOUT CONTRAST TECHNIQUE: Multiplanar, multi-echo pulse sequences of the brain and surrounding structures were acquired without intravenous contrast. Angiographic images of the intracranial venous structures were acquired using MRV technique without intravenous contrast. COMPARISON:  CT head 03/28/2024. FINDINGS: MRI HEAD WITHOUT CONTRAST Brain: Remote infarcts in the bilateral occipital lobes, right greater than left with associated surrounding gliosis. Few additional areas of cortical signal abnormality in the bilateral parietal lobes which may reflect areas of remote infarct. There are scattered areas of diffusion signal abnormality some of which demonstrate ADC hypointensity involving the cortex of the bilateral occipital lobes slightly more pronounced on the left. Additional scattered areas of diffusion signal abnormality involving the bilateral parietal cortex. Additional restricted diffusion involving the left temporal occipital lobes. Redemonstrated subdural collection over the left cerebral convexity which measures  approximately 3 mm in thickness similar to recent prior CT. No significant associated mass effect. No midline shift. T2/FLAIR hyperintensity in the periventricular and subcortical white matter. Additional remote infarcts in the bilateral corona radiata and right frontal subcortical white matter. Mild parenchymal volume loss. There are numerous scattered foci of susceptibility within the bilateral cerebral hemispheres with a few distal foci within the basal ganglia, brainstem, and cerebellum. Mild dural thickening and enhancement over the left cerebral convexity which may be reactive in the setting of subdural hematoma. No abnormal parenchymal enhancement. Ventricles: Prominence of the lateral ventricles suggestive of underlying parenchymal volume loss. Vascular: Skull base flow voids are visualized. Skull and upper cervical spine: No  focal abnormality. Sinuses/Orbits: Orbits are symmetric. Mucosal thickening in the right maxillary sinus. Other: Mastoid air cells are clear. MR VENOGRAM WITHOUT CONTRAST The superior sagittal sinus is patent. Visualized cortical veins along the convexity are unremarkable. The internal cerebral veins, vein of Galen, and straight sinus are patent. Normal appearance of the confluence of the sinuses. The left transverse and sigmoid sinuses are dominant. The left transverse and sigmoid sinus appears patent without stenosis or evidence of thrombosis. Right transverse and sigmoid sinus also appears patent without significant stenosis or evidence of thrombosis. IMPRESSION: Multiple scattered foci of infarct involving the cortex and subcortical white matter in the bilateral occipital lobes, parietal lobes, and left temporal occipital lobes. Infarcts likely of varying ages some of which appear subacute. Redemonstrated subdural collection over the left cerebral convexity measuring 3 mm in thickness. Dural thickening and enhancement over the left cerebral convexities likely reactive. No evidence  of venous sinus thrombosis.  No significant stenosis. Moderate chronic microvascular ischemic changes. Multiple remote infarcts as above. Electronically Signed   By: Denny Flack M.D.   On: 03/30/2024 16:34   CT Head Wo Contrast Result Date: 03/28/2024 CLINICAL DATA:  Right side tremors EXAM: CT HEAD WITHOUT CONTRAST TECHNIQUE: Contiguous axial images were obtained from the base of the skull through the vertex without intravenous contrast. RADIATION DOSE REDUCTION: This exam was performed according to the departmental dose-optimization program which includes automated exposure control, adjustment of the mA and/or kV according to patient size and/or use of iterative reconstruction technique. 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 Further decreased size of the previously seen mixed density left subdural hematoma, now 3 mm in thickness compared to 7 mm previously. No mass effect or midline shift. No new hemorrhage or infarction. There is atrophy and chronic small vessel disease changes. Vascular: No hyperdense vessel or unexpected calcification. Skull: No acute calvarial abnormality. Sinuses/Orbits: No acute findings Other: None IMPRESSION: Further decreased size of the previously seen mixed density left subdural hematoma, now 3 mm in thickness compared to 7 mm previously. 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 Atrophy, chronic microvascular disease. No acute intracranial abnormality. Electronically Signed   By: Janeece Mechanic M.D.   On: 03/28/2024 23:30   DG Chest Port 1 View Result Date: 03/28/2024 CLINICAL DATA:  Questionable sepsis EXAM: PORTABLE CHEST 1 VIEW COMPARISON:  Chest x-ray 02/13/2024 FINDINGS: The heart size and mediastinal contours are within normal limits. Both lungs are clear. The visualized skeletal structures are unremarkable. IMPRESSION: No active disease. Electronically Signed   By: Tyron Gallon M.D.   On: 03/28/2024 23:28   No orders to display     Consults called:  Neurology   EKG: Independently reviewed. NSR, QTc 492 ms   Faith Homes, MD [facility] 03/30/2024, 5:46 PM

## 2024-03-30 NOTE — Plan of Care (Signed)
 Full note from Dr. Doretta Gant from earlier today pending  Brief HPI 68 yo patient with hx dementia and recent spontaneous SDH in March s/p burr hole evacuation who presented 5/19 evening for complex partial seizures and was seen by telespecialists. Head CT showed further decrease size of the previously seen mixed density left subdural hematoma, now 3 mm in thickness compared to 7 mm previously.  She has a history of antiphospholipid syndrome and was on Eliquis  5 mg twice daily when she had the subdural in March.  She has been restarted now as an outpatient on Eliquis  2.5 mg twice daily to reduce the risk of arterial and venous thrombosis.  This was held on admission due to thrombocytopenia with platelets of 59.  She was loaded on Keppra 20 mg/kg on admission and started on 500 mg twice daily.  She has had 2 complex partial seizures 5/22, for which she was given 2 g additional keppra and increased to 750 mg to be started this evening and then continued every 12 hours after that.  Both episodes aborted with Ativan. History of antiphospholipid syndrome; MRI brain / MRV obtained to rule out acute infarct and/or venous sinus thrombosis contributing.  She is also on ceftriaxone  for UTI.    On exam, continuous right sided twitching predominantly shoulder and slightly in the face, intermittently answers simple questions but not oriented to age, follows simple commands including sticking out her tongue.   Recs/Plan - Keppra 1000 mg BID is max for her renal function; will adjust dose to 1000 mg BID - Will add fycompa 8 mg x 1 dose for now, conferred with primary, felt to be okay for sips with meds at this time - From neurology perspective, strokes are small and at least some of these may be seizure related changes, okay to continue low dose eliquis  if cleared from SDH perspective (presumed cleared by neurosurgery  previously given this had been restarted outpatient and she was tolerating it without any expansion of SDH)  and okay from thrombocytopenia perspective (per primary team this is a stable issue) - discussed with Dr. Gladstone Lamer via secure chat  MRI brain MRV reviewed  Multiple scattered foci of infarct involving the cortex and subcortical white matter in the bilateral occipital lobes, parietal lobes, and left temporal occipital lobes. Infarcts likely of varying ages some of which appear subacute.   Redemonstrated subdural collection over the left cerebral convexity measuring 3 mm in thickness. Dural thickening and enhancement over the left cerebral convexities likely reactive.   No evidence of venous sinus thrombosis.  No significant stenosis.   Moderate chronic microvascular ischemic changes. Multiple remote infarcts as above.  Baldwin Levee MD-PhD [facility] [phone] Available 7 AM to 7 PM, outside these hours please contact Neurologist on call listed on AMION   No charge note

## 2024-03-30 NOTE — Progress Notes (Signed)
 Patient is having seizure like activity, twitching on her right face and shoulder, patient was able to move around and verbalized she feels cold but eyes are closed Informed Dr. Achilles Holes MD ordered to given ativan 1 mg now and another 1 mg if seizure is not relieved.

## 2024-03-31 ENCOUNTER — Encounter (HOSPITAL_COMMUNITY): Admission: AD | Disposition: E | Payer: Self-pay | Source: Other Acute Inpatient Hospital | Attending: Internal Medicine

## 2024-03-31 ENCOUNTER — Inpatient Hospital Stay (HOSPITAL_COMMUNITY)

## 2024-03-31 ENCOUNTER — Other Ambulatory Visit: Payer: Self-pay

## 2024-03-31 ENCOUNTER — Inpatient Hospital Stay (HOSPITAL_COMMUNITY): Admitting: Anesthesiology

## 2024-03-31 DIAGNOSIS — Z9889 Other specified postprocedural states: Secondary | ICD-10-CM

## 2024-03-31 DIAGNOSIS — I6389 Other cerebral infarction: Secondary | ICD-10-CM | POA: Diagnosis not present

## 2024-03-31 DIAGNOSIS — Z87891 Personal history of nicotine dependence: Secondary | ICD-10-CM | POA: Diagnosis not present

## 2024-03-31 DIAGNOSIS — R569 Unspecified convulsions: Secondary | ICD-10-CM | POA: Diagnosis not present

## 2024-03-31 DIAGNOSIS — E876 Hypokalemia: Secondary | ICD-10-CM

## 2024-03-31 DIAGNOSIS — Z8679 Personal history of other diseases of the circulatory system: Secondary | ICD-10-CM

## 2024-03-31 DIAGNOSIS — R579 Shock, unspecified: Secondary | ICD-10-CM | POA: Diagnosis not present

## 2024-03-31 DIAGNOSIS — N3000 Acute cystitis without hematuria: Secondary | ICD-10-CM

## 2024-03-31 DIAGNOSIS — J449 Chronic obstructive pulmonary disease, unspecified: Secondary | ICD-10-CM | POA: Diagnosis not present

## 2024-03-31 DIAGNOSIS — Z862 Personal history of diseases of the blood and blood-forming organs and certain disorders involving the immune mechanism: Secondary | ICD-10-CM

## 2024-03-31 DIAGNOSIS — D6861 Antiphospholipid syndrome: Secondary | ICD-10-CM

## 2024-03-31 DIAGNOSIS — Z7901 Long term (current) use of anticoagulants: Secondary | ICD-10-CM

## 2024-03-31 DIAGNOSIS — R258 Other abnormal involuntary movements: Secondary | ICD-10-CM

## 2024-03-31 DIAGNOSIS — S25102A Unspecified injury of left innominate or subclavian artery, initial encounter: Secondary | ICD-10-CM

## 2024-03-31 DIAGNOSIS — A419 Sepsis, unspecified organism: Secondary | ICD-10-CM | POA: Diagnosis not present

## 2024-03-31 DIAGNOSIS — T8249XA Other complication of vascular dialysis catheter, initial encounter: Secondary | ICD-10-CM

## 2024-03-31 DIAGNOSIS — N390 Urinary tract infection, site not specified: Secondary | ICD-10-CM | POA: Diagnosis not present

## 2024-03-31 DIAGNOSIS — I6200 Nontraumatic subdural hemorrhage, unspecified: Secondary | ICD-10-CM | POA: Diagnosis not present

## 2024-03-31 DIAGNOSIS — Z79899 Other long term (current) drug therapy: Secondary | ICD-10-CM

## 2024-03-31 DIAGNOSIS — I10 Essential (primary) hypertension: Secondary | ICD-10-CM | POA: Diagnosis not present

## 2024-03-31 DIAGNOSIS — Z9911 Dependence on respirator [ventilator] status: Secondary | ICD-10-CM

## 2024-03-31 HISTORY — PX: ULTRASOUND GUIDANCE FOR VASCULAR ACCESS: SHX6516

## 2024-03-31 HISTORY — PX: AORTOGRAM: SHX6300

## 2024-03-31 LAB — CBC
HCT: 31.5 % — ABNORMAL LOW (ref 36.0–46.0)
Hemoglobin: 10.2 g/dL — ABNORMAL LOW (ref 12.0–15.0)
MCH: 28 pg (ref 26.0–34.0)
MCHC: 32.4 g/dL (ref 30.0–36.0)
MCV: 86.5 fL (ref 80.0–100.0)
Platelets: 60 10*3/uL — ABNORMAL LOW (ref 150–400)
RBC: 3.64 MIL/uL — ABNORMAL LOW (ref 3.87–5.11)
RDW: 14.6 % (ref 11.5–15.5)
WBC: 9.9 10*3/uL (ref 4.0–10.5)
nRBC: 0 % (ref 0.0–0.2)

## 2024-03-31 LAB — URINE CULTURE: Culture: 30000 — AB

## 2024-03-31 LAB — POCT I-STAT 7, (LYTES, BLD GAS, ICA,H+H)
Acid-base deficit: 7 mmol/L — ABNORMAL HIGH (ref 0.0–2.0)
Acid-base deficit: 5 mmol/L — ABNORMAL HIGH (ref 0.0–2.0)
Bicarbonate: 20.3 mmol/L (ref 20.0–28.0)
Bicarbonate: 20.2 mmol/L (ref 20.0–28.0)
Calcium, Ion: 1.2 mmol/L (ref 1.15–1.40)
Calcium, Ion: 1.21 mmol/L (ref 1.15–1.40)
HCT: 28 % — ABNORMAL LOW (ref 36.0–46.0)
HCT: 25 % — ABNORMAL LOW (ref 36.0–46.0)
Hemoglobin: 9.5 g/dL — ABNORMAL LOW (ref 12.0–15.0)
Hemoglobin: 8.5 g/dL — ABNORMAL LOW (ref 12.0–15.0)
O2 Saturation: 100 %
O2 Saturation: 99 %
Patient temperature: 97.8
Patient temperature: 97.7
Potassium: 3.3 mmol/L — ABNORMAL LOW (ref 3.5–5.1)
Potassium: 3.1 mmol/L — ABNORMAL LOW (ref 3.5–5.1)
Sodium: 145 mmol/L (ref 135–145)
Sodium: 144 mmol/L (ref 135–145)
TCO2: 22 mmol/L (ref 22–32)
TCO2: 21 mmol/L — ABNORMAL LOW (ref 22–32)
pCO2 arterial: 44.8 mmHg (ref 32–48)
pCO2 arterial: 35.8 mmHg (ref 32–48)
pH, Arterial: 7.263 — ABNORMAL LOW (ref 7.35–7.45)
pH, Arterial: 7.357 (ref 7.35–7.45)
pO2, Arterial: 284 mmHg — ABNORMAL HIGH (ref 83–108)
pO2, Arterial: 138 mmHg — ABNORMAL HIGH (ref 83–108)

## 2024-03-31 LAB — CBC WITH DIFFERENTIAL/PLATELET
Abs Immature Granulocytes: 0.09 10*3/uL — ABNORMAL HIGH (ref 0.00–0.07)
Basophils Absolute: 0.1 10*3/uL (ref 0.0–0.1)
Basophils Relative: 1 %
Eosinophils Absolute: 0.6 10*3/uL — ABNORMAL HIGH (ref 0.0–0.5)
Eosinophils Relative: 4 %
HCT: 28 % — ABNORMAL LOW (ref 36.0–46.0)
Hemoglobin: 9.4 g/dL — ABNORMAL LOW (ref 12.0–15.0)
Immature Granulocytes: 1 %
Lymphocytes Relative: 9 %
Lymphs Abs: 1.7 10*3/uL (ref 0.7–4.0)
MCH: 28.5 pg (ref 26.0–34.0)
MCHC: 33.6 g/dL (ref 30.0–36.0)
MCV: 84.8 fL (ref 80.0–100.0)
Monocytes Absolute: 0.8 10*3/uL (ref 0.1–1.0)
Monocytes Relative: 5 %
Neutro Abs: 14.7 10*3/uL — ABNORMAL HIGH (ref 1.7–7.7)
Neutrophils Relative %: 80 %
Platelets: 97 10*3/uL — ABNORMAL LOW (ref 150–400)
RBC: 3.3 MIL/uL — ABNORMAL LOW (ref 3.87–5.11)
RDW: 14.6 % (ref 11.5–15.5)
WBC: 18 10*3/uL — ABNORMAL HIGH (ref 4.0–10.5)
nRBC: 0 % (ref 0.0–0.2)

## 2024-03-31 LAB — ECHOCARDIOGRAM COMPLETE
AR max vel: 2.04 cm2
AV Area VTI: 2.09 cm2
AV Area mean vel: 2.03 cm2
AV Mean grad: 5 mmHg
AV Peak grad: 10.2 mmHg
Ao pk vel: 1.6 m/s
Area-P 1/2: 3.21 cm2
S' Lateral: 2.9 cm

## 2024-03-31 LAB — LIPID PANEL
Cholesterol: 86 mg/dL (ref 0–200)
HDL: 26 mg/dL — ABNORMAL LOW (ref >40)
LDL Cholesterol: 43 mg/dL (ref 0–99)
Total CHOL/HDL Ratio: 3.3 ratio
Triglycerides: 83 mg/dL (ref <150)
VLDL: 17 mg/dL (ref 0–40)

## 2024-03-31 LAB — MRSA NEXT GEN BY PCR, NASAL: MRSA by PCR Next Gen: NOT DETECTED

## 2024-03-31 LAB — COMPREHENSIVE METABOLIC PANEL WITH GFR
ALT: 14 U/L (ref 0–44)
AST: 26 U/L (ref 15–41)
Albumin: 2.3 g/dL — ABNORMAL LOW (ref 3.5–5.0)
Alkaline Phosphatase: 52 U/L (ref 38–126)
Anion gap: 6 (ref 5–15)
BUN: <5 mg/dL — ABNORMAL LOW (ref 8–23)
CO2: 19 mmol/L — ABNORMAL LOW (ref 22–32)
Calcium: 8 mg/dL — ABNORMAL LOW (ref 8.9–10.3)
Chloride: 116 mmol/L — ABNORMAL HIGH (ref 98–111)
Creatinine, Ser: 0.72 mg/dL (ref 0.44–1.00)
GFR, Estimated: >60 mL/min (ref >60)
Glucose, Bld: 113 mg/dL — ABNORMAL HIGH (ref 70–99)
Potassium: 3.5 mmol/L (ref 3.5–5.1)
Sodium: 141 mmol/L (ref 135–145)
Total Bilirubin: 0.5 mg/dL (ref 0.0–1.2)
Total Protein: 5 g/dL — ABNORMAL LOW (ref 6.5–8.1)

## 2024-03-31 LAB — TYPE AND SCREEN
ABO/RH(D): O POS
Antibody Screen: NEGATIVE

## 2024-03-31 LAB — HEMOGLOBIN A1C
Hgb A1c MFr Bld: 4.5 % — ABNORMAL LOW (ref 4.8–5.6)
Mean Plasma Glucose: 82.45 mg/dL

## 2024-03-31 LAB — APTT: aPTT: 38 s — ABNORMAL HIGH (ref 24–36)

## 2024-03-31 LAB — GLUCOSE, CAPILLARY: Glucose-Capillary: 81 mg/dL (ref 70–99)

## 2024-03-31 SURGERY — AORTOGRAM
Anesthesia: General | Site: Groin | Laterality: Right

## 2024-03-31 MED ORDER — CHLORHEXIDINE GLUCONATE CLOTH 2 % EX PADS
6.0000 | MEDICATED_PAD | Freq: Every day | CUTANEOUS | Status: DC
  Administered 2024-04-01 – 2024-04-11 (×13): 6 via TOPICAL

## 2024-03-31 MED ORDER — PHENYLEPHRINE 80 MCG/ML (10ML) SYRINGE FOR IV PUSH (FOR BLOOD PRESSURE SUPPORT)
80.0000 ug | PREFILLED_SYRINGE | Freq: Once | INTRAVENOUS | Status: DC | PRN
  Filled 2024-03-31: qty 10

## 2024-03-31 MED ORDER — HEPARIN 6000 UNIT IRRIGATION SOLUTION
Status: DC | PRN
  Administered 2024-03-31: 1

## 2024-03-31 MED ORDER — SODIUM BICARBONATE 8.4 % IV SOLN: 50.0000 meq | Freq: Once | INTRAVENOUS | Status: AC

## 2024-03-31 MED ORDER — PROPOFOL 1000 MG/100ML IV EMUL
40.0000 ug/kg/min | INTRAVENOUS | Status: DC
  Administered 2024-03-31: 20 ug/kg/min via INTRAVENOUS
  Filled 2024-03-31: qty 200

## 2024-03-31 MED ORDER — FENTANYL CITRATE PF 50 MCG/ML IJ SOSY: 50.0000 ug | PREFILLED_SYRINGE | INTRAMUSCULAR | Status: DC | PRN

## 2024-03-31 MED ORDER — ROCURONIUM BROMIDE 10 MG/ML (PF) SYRINGE
PREFILLED_SYRINGE | INTRAVENOUS | Status: AC
  Filled 2024-03-31: qty 10

## 2024-03-31 MED ORDER — ROCURONIUM BROMIDE 10 MG/ML (PF) SYRINGE
25.0000 mg | PREFILLED_SYRINGE | Freq: Once | INTRAVENOUS | Status: AC
Start: 2024-03-31 — End: 2024-03-31

## 2024-03-31 MED ORDER — FAMOTIDINE 20 MG PO TABS
20.0000 mg | ORAL_TABLET | Freq: Two times a day (BID) | ORAL | Status: DC
Start: 2024-03-31 — End: 2024-04-07
  Administered 2024-03-31 – 2024-04-06 (×14): 20 mg
  Filled 2024-03-31 (×14): qty 1

## 2024-03-31 MED ORDER — ACETAMINOPHEN 650 MG RE SUPP: 650.0000 mg | Freq: Four times a day (QID) | RECTAL | Status: DC | PRN

## 2024-03-31 MED ORDER — LEVOTHYROXINE SODIUM 25 MCG PO TABS
125.0000 ug | ORAL_TABLET | Freq: Every day | ORAL | Status: DC
  Administered 2024-04-01 – 2024-04-11 (×11): 125 ug
  Filled 2024-03-31 (×11): qty 1

## 2024-03-31 MED ORDER — SODIUM CHLORIDE 0.9% FLUSH: 10.0000 mL | INTRAVENOUS | Status: DC | PRN

## 2024-03-31 MED ORDER — LACTATED RINGERS IV BOLUS
1000.0000 mL | Freq: Once | INTRAVENOUS | Status: AC
  Administered 2024-03-31: 1000 mL via INTRAVENOUS

## 2024-03-31 MED ORDER — SODIUM CHLORIDE 0.9% IV SOLUTION: Freq: Once | INTRAVENOUS | Status: DC

## 2024-03-31 MED ORDER — ORAL CARE MOUTH RINSE
15.0000 mL | OROMUCOSAL | Status: DC
Start: 2024-03-31 — End: 2024-04-11
  Administered 2024-03-31 – 2024-04-11 (×139): 15 mL via OROMUCOSAL

## 2024-03-31 MED ORDER — PHENYLEPHRINE 80 MCG/ML (10ML) SYRINGE FOR IV PUSH (FOR BLOOD PRESSURE SUPPORT): 400.0000 ug | PREFILLED_SYRINGE | Freq: Once | INTRAVENOUS | Status: AC

## 2024-03-31 MED ORDER — SODIUM CHLORIDE 0.9 % IV SOLN
2.0000 g | INTRAVENOUS | Status: DC
  Administered 2024-03-31: 2 g via INTRAVENOUS
  Filled 2024-03-31: qty 20

## 2024-03-31 MED ORDER — SODIUM CHLORIDE 0.9 % IV SOLN: INTRAVENOUS | Status: DC | PRN

## 2024-03-31 MED ORDER — LACTATED RINGERS IV SOLN: INTRAVENOUS | Status: DC

## 2024-03-31 MED ORDER — APIXABAN 2.5 MG PO TABS
2.5000 mg | ORAL_TABLET | Freq: Two times a day (BID) | ORAL | Status: DC
  Filled 2024-03-31: qty 1

## 2024-03-31 MED ORDER — SODIUM CHLORIDE 0.9 % IV SOLN: 20.0000 mg/kg | Freq: Once | INTRAVENOUS | Status: DC

## 2024-03-31 MED ORDER — SODIUM CHLORIDE 0.9 % WEIGHT BASED INFUSION
1.0000 mL/kg/h | INTRAVENOUS | Status: DC
Start: 2024-03-31 — End: 2024-03-31

## 2024-03-31 MED ORDER — IODIXANOL 320 MG/ML IV SOLN
INTRAVENOUS | Status: DC | PRN
  Administered 2024-03-31: 15 mL

## 2024-03-31 MED ORDER — CEFAZOLIN SODIUM-DEXTROSE 1-4 GM/50ML-% IV SOLN
INTRAVENOUS | Status: DC | PRN
  Administered 2024-03-31: 2 g via INTRAVENOUS

## 2024-03-31 MED ORDER — HEPARIN SODIUM (PORCINE) 1000 UNIT/ML IJ SOLN
INTRAMUSCULAR | Status: DC | PRN
Start: 2024-03-31 — End: 2024-03-31
  Administered 2024-03-31: 3000 [IU] via INTRAVENOUS

## 2024-03-31 MED ORDER — SODIUM CHLORIDE 0.9% FLUSH
10.0000 mL | Freq: Two times a day (BID) | INTRAVENOUS | Status: DC
  Administered 2024-03-31: 10 mL
  Administered 2024-03-31: 30 mL
  Administered 2024-04-01 (×2): 20 mL
  Administered 2024-04-02 (×2): 10 mL
  Administered 2024-04-03: 20 mL
  Administered 2024-04-03 – 2024-04-11 (×16): 10 mL

## 2024-03-31 MED ORDER — HEPARIN 6000 UNIT IRRIGATION SOLUTION
Status: AC
  Filled 2024-03-31: qty 500

## 2024-03-31 MED ORDER — MIDAZOLAM HCL 2 MG/2ML IJ SOLN: 2.0000 mg | Freq: Once | INTRAMUSCULAR | Status: AC

## 2024-03-31 MED ORDER — LACTATED RINGERS IV BOLUS
500.0000 mL | Freq: Once | INTRAVENOUS | Status: AC
  Administered 2024-03-31: 500 mL via INTRAVENOUS

## 2024-03-31 MED ORDER — CEFAZOLIN SODIUM 1 G IJ SOLR
INTRAMUSCULAR | Status: AC
Start: 2024-03-31 — End: ?
  Filled 2024-03-31: qty 20

## 2024-03-31 MED ORDER — NOREPINEPHRINE 4 MG/250ML-% IV SOLN
0.0000 ug/min | INTRAVENOUS | Status: DC
  Administered 2024-03-31: 2 ug/min via INTRAVENOUS
  Filled 2024-03-31: qty 250

## 2024-03-31 MED ORDER — POLYETHYLENE GLYCOL 3350 17 G PO PACK
17.0000 g | PACK | Freq: Every day | ORAL | Status: DC
  Administered 2024-04-01 – 2024-04-10 (×5): 17 g
  Filled 2024-03-31 (×5): qty 1

## 2024-03-31 MED ORDER — DOCUSATE SODIUM 50 MG/5ML PO LIQD
100.0000 mg | Freq: Two times a day (BID) | ORAL | Status: DC
  Administered 2024-03-31 – 2024-04-01 (×2): 100 mg
  Filled 2024-03-31 (×2): qty 10

## 2024-03-31 MED ORDER — PROPOFOL 1000 MG/100ML IV EMUL: 40.0000 ug/kg/min | INTRAVENOUS | Status: DC

## 2024-03-31 MED ORDER — SODIUM CHLORIDE 0.9 % IV SOLN
100.0000 mg | Freq: Two times a day (BID) | INTRAVENOUS | Status: DC
  Administered 2024-03-31 – 2024-04-10 (×21): 100 mg via INTRAVENOUS
  Filled 2024-03-31 (×25): qty 10

## 2024-03-31 MED ORDER — MIDAZOLAM HCL 2 MG/2ML IJ SOLN
1.0000 mg | INTRAMUSCULAR | Status: DC | PRN
  Administered 2024-04-02: 2 mg via INTRAVENOUS
  Filled 2024-03-31: qty 2

## 2024-03-31 MED ORDER — MAGNESIUM SULFATE 2 GM/50ML IV SOLN
2.0000 g | Freq: Once | INTRAVENOUS | Status: AC
  Administered 2024-03-31: 2 g via INTRAVENOUS
  Filled 2024-03-31: qty 50

## 2024-03-31 MED ORDER — ROCURONIUM BROMIDE 10 MG/ML (PF) SYRINGE
PREFILLED_SYRINGE | INTRAVENOUS | Status: DC | PRN
  Administered 2024-03-31: 30 mg via INTRAVENOUS

## 2024-03-31 MED ORDER — VASOPRESSIN 20 UNIT/ML IV SOLN
INTRAVENOUS | Status: AC
  Filled 2024-03-31: qty 1

## 2024-03-31 MED ORDER — IOHEXOL 350 MG/ML SOLN
75.0000 mL | Freq: Once | INTRAVENOUS | Status: AC | PRN
  Administered 2024-03-31: 75 mL via INTRAVENOUS

## 2024-03-31 MED ORDER — ORAL CARE MOUTH RINSE: 15.0000 mL | OROMUCOSAL | Status: DC | PRN

## 2024-03-31 MED ORDER — FENTANYL CITRATE PF 50 MCG/ML IJ SOSY
25.0000 ug | PREFILLED_SYRINGE | INTRAMUSCULAR | Status: DC | PRN
  Administered 2024-03-31 (×2): 50 ug via INTRAVENOUS
  Administered 2024-04-02 – 2024-04-03 (×2): 100 ug via INTRAVENOUS
  Administered 2024-04-10 (×2): 25 ug via INTRAVENOUS
  Administered 2024-04-11: 100 ug via INTRAVENOUS
  Filled 2024-03-31 (×2): qty 1
  Filled 2024-03-31: qty 2
  Filled 2024-03-31 (×3): qty 1
  Filled 2024-03-31 (×2): qty 2
  Filled 2024-03-31: qty 1

## 2024-03-31 MED ORDER — SODIUM CHLORIDE 0.9 % IV SOLN
20.0000 mg/kg | Freq: Once | INTRAVENOUS | Status: AC
  Administered 2024-03-31: 933.4 mg via INTRAVENOUS
  Filled 2024-03-31: qty 7.18

## 2024-03-31 MED ORDER — SODIUM CHLORIDE 0.9 % IV SOLN: 250.0000 mL | INTRAVENOUS | Status: AC

## 2024-03-31 MED ORDER — ROSUVASTATIN CALCIUM 20 MG PO TABS
20.0000 mg | ORAL_TABLET | Freq: Every day | ORAL | Status: DC
  Administered 2024-03-31 – 2024-04-10 (×11): 20 mg
  Filled 2024-03-31 (×11): qty 1

## 2024-03-31 MED ORDER — ACETAMINOPHEN 160 MG/5ML PO SOLN
650.0000 mg | Freq: Four times a day (QID) | ORAL | Status: DC | PRN
  Administered 2024-04-01 – 2024-04-10 (×2): 650 mg
  Filled 2024-03-31 (×2): qty 20.3

## 2024-03-31 MED ORDER — POTASSIUM CHLORIDE 20 MEQ PO PACK
40.0000 meq | PACK | Freq: Once | ORAL | Status: AC
  Administered 2024-03-31: 40 meq
  Filled 2024-03-31: qty 2

## 2024-03-31 MED ORDER — FENTANYL 2500MCG IN NS 250ML (10MCG/ML) PREMIX INFUSION: 0.0000 ug/h | INTRAVENOUS | Status: DC

## 2024-03-31 MED ORDER — MIDAZOLAM HCL 2 MG/2ML IJ SOLN
INTRAMUSCULAR | Status: AC
  Filled 2024-03-31: qty 2

## 2024-03-31 MED ORDER — PROPOFOL 1000 MG/100ML IV EMUL
0.0000 ug/kg/min | INTRAVENOUS | Status: DC
  Administered 2024-03-31 – 2024-04-01 (×4): 40 ug/kg/min via INTRAVENOUS
  Filled 2024-03-31: qty 200

## 2024-03-31 MED ORDER — FENTANYL CITRATE PF 50 MCG/ML IJ SOSY
25.0000 ug | PREFILLED_SYRINGE | INTRAMUSCULAR | Status: AC | PRN
  Administered 2024-03-31 (×3): 25 ug via INTRAVENOUS
  Filled 2024-03-31: qty 1

## 2024-03-31 MED ORDER — PHENYLEPHRINE 80 MCG/ML (10ML) SYRINGE FOR IV PUSH (FOR BLOOD PRESSURE SUPPORT)
PREFILLED_SYRINGE | INTRAVENOUS | Status: AC
  Filled 2024-03-31: qty 10

## 2024-03-31 MED ORDER — NOREPINEPHRINE 4 MG/250ML-% IV SOLN
0.0000 ug/min | INTRAVENOUS | Status: DC
  Administered 2024-03-31 – 2024-04-01 (×3): 8 ug/min via INTRAVENOUS
  Administered 2024-04-02: 6 ug/min via INTRAVENOUS
  Filled 2024-03-31 (×4): qty 250

## 2024-03-31 MED ORDER — SODIUM BICARBONATE 8.4 % IV SOLN
INTRAVENOUS | Status: AC
Start: 2024-03-31 — End: 2024-03-31
  Administered 2024-03-31: 50 meq via INTRAVENOUS
  Filled 2024-03-31: qty 50

## 2024-03-31 SURGICAL SUPPLY — 29 items
BAG COUNTER SPONGE SURGICOUNT (BAG) ×1 IMPLANT
BIOPATCH RED 1 DISK 7.0 (GAUZE/BANDAGES/DRESSINGS) IMPLANT
CANISTER SUCTION 3000ML PPV (SUCTIONS) ×1 IMPLANT
CHLORAPREP W/TINT 26 (MISCELLANEOUS) ×1 IMPLANT
COVER PROBE W GEL 5X96 (DRAPES) ×1 IMPLANT
DERMABOND ADVANCED .7 DNX12 (GAUZE/BANDAGES/DRESSINGS) IMPLANT
DEVICE CLOSURE MYNXGRIP 6/7F (Vascular Products) IMPLANT
DRAPE FEMORAL ANGIO 80X135IN (DRAPES) IMPLANT
GAUZE 4X4 16PLY ~~LOC~~+RFID DBL (SPONGE) IMPLANT
GLIDEWIRE ADV .035X180CM (WIRE) IMPLANT
GLIDEWIRE ADV .035X260CM (WIRE) IMPLANT
GLOVE BIO SURGEON STRL SZ7.5 (GLOVE) ×1 IMPLANT
GLOVE INDICATOR 8.0 STRL GRN (GLOVE) ×1 IMPLANT
GOWN STRL REUS W/ TWL XL LVL3 (GOWN DISPOSABLE) ×1 IMPLANT
GUIDEWIRE ANGLED .035X150CM (WIRE) IMPLANT
KIT BASIN OR (CUSTOM PROCEDURE TRAY) ×1 IMPLANT
KIT ENCORE 26 ADVANTAGE (KITS) IMPLANT
KIT TURNOVER KIT B (KITS) ×1 IMPLANT
NDL PERC 18GX7CM (NEEDLE) ×1 IMPLANT
NEEDLE PERC 18GX7CM (NEEDLE) ×1 IMPLANT
NS IRRIG 1000ML POUR BTL (IV SOLUTION) ×1 IMPLANT
PACK ENDO MINOR (CUSTOM PROCEDURE TRAY) ×1 IMPLANT
PAD ARMBOARD POSITIONER FOAM (MISCELLANEOUS) ×2 IMPLANT
SET MICROPUNCTURE 5F STIFF (MISCELLANEOUS) IMPLANT
SHEATH PINNACLE 6F 10CM (SHEATH) IMPLANT
STATION PROTECTION PRESSURIZED (MISCELLANEOUS) IMPLANT
STENT VIABAHN 7X29 6FR 135 (Permanent Stent) IMPLANT
SYR 20ML LL LF (SYRINGE) ×2 IMPLANT
TOWEL GREEN STERILE (TOWEL DISPOSABLE) ×2 IMPLANT

## 2024-03-31 NOTE — Procedures (Addendum)
 Central Venous Catheter Insertion Procedure Note  Alison Lopez  [id number]  1956-01-13  Date:03/31/24  Time:6:47 AM   Provider Performing:Elandra Powell Dyana Glade Adriane Albe   Procedure: Insertion of Non-tunneled Central Venous Catheter(36556) with US  guidance (32440)   Indication(s) Medication administration  Consent Unable to obtain consent due to emergent nature of procedure.  Anesthesia Topical only with 1% lidocaine    Timeout Verified patient identification, verified procedure, site/side was marked, verified correct patient position, special equipment/implants available, medications/allergies/relevant history reviewed, required imaging and test results available.  Sterile Technique Maximal sterile technique including full sterile barrier drape, hand hygiene, sterile gown, sterile gloves, mask, hair covering, sterile ultrasound probe cover (if used).  Procedure Description Area of catheter insertion was cleaned with chlorhexidine  and draped in sterile fashion.  With real-time ultrasound guidance a central venous catheter was placed into the right femoral vein. Nonpulsatile blood flow and easy flushing noted in all ports.  The catheter was sutured in place and sterile dressing applied.  Complications/Tolerance None; patient tolerated the procedure well. Chest X-ray is ordered to verify placement for internal jugular or subclavian cannulation.   Chest x-ray is not ordered for femoral cannulation.  EBL Minimal  Specimen(s) None     Alison Lopez, AGACNP-BC [facility]  See Amion for personal pager PCCM on call pager [phone] until 7pm. Please call Elink 7p-7a. [phone]  03/31/2024 6:47 AM

## 2024-03-31 NOTE — Procedures (Signed)
 Intubation Procedure Note  ANNICE JOLLY  [id number]  1956-07-02  Date:03/31/24  Time:5:38 AM   Provider Performing:Artis Beggs Mason Sole    Procedure: Intubation (31500)  Indication(s) Respiratory Failure  Consent Unable to obtain consent due to emergent nature of procedure.   Anesthesia Versed  and Rocuronium    Time Out Verified patient identification, verified procedure, site/side was marked, verified correct patient position, special equipment/implants available, medications/allergies/relevant history reviewed, required imaging and test results available.   Sterile Technique Usual hand hygeine, masks, and gloves were used   Procedure Description Patient positioned in bed supine.  Sedation given as noted above.  Patient was intubated with endotracheal tube using Glidescope.  View was Grade 1 full glottis .  Number of attempts was 1.  Colorimetric CO2 detector was consistent with tracheal placement.   Complications/Tolerance None; patient tolerated the procedure well. Chest X-ray is ordered to verify placement.   EBL 0   Specimen(s) None

## 2024-03-31 NOTE — Progress Notes (Signed)
 Pt transported to and from CT scan on the ventilator without incident.

## 2024-03-31 NOTE — Progress Notes (Signed)
 NAME:  Alison Lopez, MRN:  [id number], DOB:  Nov 21, 1955, LOS: 1 ADMISSION DATE:  03/30/2024, CONSULTATION DATE: 5/22 REFERRING MD:  Dr Gladstone Lamer, CHIEF COMPLAINT: Seizure  History of Present Illness:  Patient is encephalopathic and/or intubated; therefore, history has been obtained from chart review.  68 year old female with past medical history as below, which is significant for COPD, dementia, diabetes, DVT, antiphospholipid syndrome and DVT on Eliquis , hypertension, lupus, hypothyroidism, and recent admission for subdural hematoma in April 2025 with bur hole evacuation.  Residual right-sided weakness.  She was discharged from SNF about 1 week prior to her presentation 5/21 with chief complaint of new onset seizure and altered mental status.  Seizures were indeed witnessed in the emergency department and she was given Ativan and Keppra.  She continued to have seizures despite treatment and was given additional Ativan and Keppra.  She was also started on ceftriaxone  for UTI.  Seizures were focal in nature with shoulder and facial twitching.  CT of the head showed decreasing subdural size.  MRI MRV showed multiple scattered foci of small infarcts.  She was transferred [facility] to [facility] for continuous EEG monitoring.  She continued to seize even despite Vimpat load and was transferred to ICU for phenobarbital ministration and concern for airway.  PCCM is consulted.   Pertinent  Medical History   has a past medical history of Anxiety, Arthritis, Collagen vascular disease (HCC), COPD (chronic obstructive pulmonary disease) (HCC), DDD (degenerative disc disease), lumbar, Dementia (HCC), Depression, Diabetes mellitus without complication (HCC), DVT (deep venous thrombosis) (HCC), Fibromyalgia, GERD (gastroesophageal reflux disease), Graves' disease with exophthalmos, HLD (hyperlipidemia), Hypertension, Hypothyroidism, Lupus, and Osteoporosis.   Significant Hospital Events: Including procedures,  antibiotic start and stop dates in addition to other pertinent events   5/22 transferred to ICU for seizure and worsening mental status, intubated for airway protection, required pressors and line placement, L subclavian placed arterially, vascular consulted   Interim History / Subjective:  Pt stable on norepi , on fixed dose Propofol  40mcg for burst suppression Vascular consult pending Transfused 2 units platelets overnight, slight oozing from the R femoral CVC placement site  Objective    Blood pressure (!) 121/34, pulse 99, temperature 97.9 F (36.6 C), temperature source Axillary, resp. rate (!) 25, SpO2 98%.    Vent Mode: PRVC FiO2 (%):  [40 %-100 %] 40 % Set Rate:  [18 bmp-25 bmp] 25 bmp Vt Set:  [310 mL] 310 mL PEEP:  [5 cmH20] 5 cmH20 Plateau Pressure:  [11 cmH20-12 cmH20] 11 cmH20   Intake/Output Summary (Last 24 hours) at 03/31/2024 0755 Last data filed at 03/31/2024 0700 Gross per 24 hour  Intake 1296.61 ml  Output 1050 ml  Net 246.61 ml   There were no vitals filed for this visit.   General:  critically ill elderly F intubated and sedated  HEENT: MM pink/moist, ETT in place Neuro: examined on propfol, RASS -4, R pupil 5mm (baseline) L pupil 2mm sluggishly responsive to light, no gag, no corneals, occasionally triggering vent  CV: s1s2 rrr, no m/r/g PULM:  clear bilaterally on mechanical ventilation, equal chest rise, thick white respiratory secretions suctioned  GI: soft, non-distended  Extremities: warm/dry, trace edema   Labs: Platelets 60, Hgb 10.2 Creatinine 0.72  Micro/abx UC 5/19>30K E. 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 - fixed  dose propofol  40mcg for burst suppression - Continuous EEG - -Maintain full vent support with SAT/SBT as tolerated -titrate Vent  setting to maintain SpO2 greater than or equal to 90%. -HOB elevated 30 degrees. -Plateau pressures less than 30 cm H20.  -Follow chest x-ray, ABG prn.   -Bronchial hygiene and RT/bronchodilator protocol.   Recent subdural hematoma: Status post bur hole evacuation in March.  Size reduced on CT now Multifocal bilateral small strokes: Some of which may be seizure related change - Okay to continue Eliquis  per neuro, but holding now pending vascular consult for malpositioned CVC - Echocardiogram pending  Sepsis secondary to acute cystitis: Urine culture from 5/20 grew showing 30K colonies of E. coli - Continue Rocephin   Antiphospholipid syndrome  Chronic Thrombocytopenia  with history of DVT on chronic Eliquis  - platelets 60, received 2 units platelets overnight  -holding Eliquis , last dose per records 5/19  Hypokalemia Hypomagnesemia -trend electrolytes and replete prn   Hypothyroid - Synthroid   Malpositioned L subclavian CVC -hold Eliquis  -vascular consult -no current bleeding from subclavian site   Best Practice (right click and "Reselect all SmartList Selections" daily)   Diet/type: NPO DVT prophylaxis SCD Pressure ulcer(s): pressure ulcer assessment deferred  GI prophylaxis: N/A Lines: Central line Foley:  N/A Code Status:  full code Last date of multidisciplinary goals of care discussion [family update pending]  Labs   CBC: Recent Labs  Lab 03/28/24 2133 03/30/24 0545 03/31/24 0536 03/31/24 0549  WBC 7.6 8.9 9.9  --   NEUTROABS 5.8  --   --   --   HGB 11.4* 10.2* 10.2* 9.5*  HCT 33.5* 31.1* 31.5* 28.0*  MCV 83.8 84.7 86.5  --   PLT 59* 54* 60*  --     Basic Metabolic Panel: Recent Labs  Lab 03/28/24 2133 03/29/24 0738 03/30/24 0544 03/30/24 0545 03/30/24 1803 03/31/24 0536 03/31/24 0549  NA 135  --   --  145 144 141 145  K 2.8*  --   --  2.9* 3.0* 3.5 3.3*  CL 104  --   --  115* 115* 116*  --   CO2 22  --   --  22 21* 19*  --   GLUCOSE 112*   --   --  129* 109* 113*  --   BUN 15  --   --  <5* <5* <5*  --   CREATININE 0.69  --   --  0.62 0.80 0.72  --   CALCIUM  8.4*  --   --  7.8* 8.2* 8.0*  --   MG 1.8 1.5* 1.7  --  1.6*  --   --   PHOS 3.5  --  2.6  --   --   --   --    GFR: Estimated Creatinine Clearance: 43.8 mL/min (by C-G formula based on SCr of 0.72 mg/dL). Recent Labs  Lab 03/28/24 2133 03/29/24 0110 03/30/24 0545 03/31/24 0536  WBC 7.6  --  8.9 9.9  LATICACIDVEN 1.3 1.3  --   --     Liver Function Tests: Recent Labs  Lab 03/28/24 2133 03/31/24 0536  AST 19 26  ALT 10 14  ALKPHOS 54 52  BILITOT 0.7 0.5  PROT 6.2* 5.0*  ALBUMIN 3.0* 2.3*   No results for input(s): "LIPASE", "AMYLASE" in the last 168 hours. No results for input(s): "AMMONIA" in the last 168 hours.  ABG    Component Value Date/Time   PHART 7.263 (L) 03/31/2024 0549   PCO2ART 44.8  03/31/2024 0549   PO2ART 284 (H) 03/31/2024 0549   HCO3 20.3 03/31/2024 0549   TCO2 22 03/31/2024 0549   ACIDBASEDEF 7.0 (H) 03/31/2024 0549   O2SAT 100 03/31/2024 0549     Coagulation Profile: Recent Labs  Lab 03/28/24 2133  INR 1.3*    Cardiac Enzymes: Recent Labs  Lab 03/29/24 0738  CKTOTAL 49    HbA1C: Hgb A1c MFr Bld  Date/Time Value Ref Range Status  01/30/2024 03:11 AM 4.8 4.8 - 5.6 % Final    Comment:    (NOTE) Pre diabetes:          5.7%-6.4%  Diabetes:              >6.4%  Glycemic control for   <7.0% adults with diabetes   04/22/2019 07:30 AM 6.0 (H) 4.8 - 5.6 % Final    Comment:    (NOTE) Pre diabetes:          5.7%-6.4% Diabetes:              >6.4% Glycemic control for   <7.0% adults with diabetes     CBG: Recent Labs  Lab 03/29/24 0227 03/29/24 0427 03/29/24 1651 03/30/24 1717 03/31/24 0303  GLUCAP 82 77 137* 91 81    Review of Systems:   Patient is encephalopathic and/or intubated; therefore, history has been obtained from chart review.    Past Medical History:  She,  has a past medical history of  Anxiety, Arthritis, Collagen vascular disease (HCC), COPD (chronic obstructive pulmonary disease) (HCC), DDD (degenerative disc disease), lumbar, Dementia (HCC), Depression, Diabetes mellitus without complication (HCC), DVT (deep venous thrombosis) (HCC), Fibromyalgia, GERD (gastroesophageal reflux disease), Graves' disease with exophthalmos, HLD (hyperlipidemia), Hypertension, Hypothyroidism, Lupus, and Osteoporosis.   Surgical History:   Past Surgical History:  Procedure Laterality Date   ABDOMINAL HYSTERECTOMY     BURR HOLE Bilateral 01/30/2024   Procedure: CREATION, CRANIAL BURR HOLE, SUBDURAL HEMATOMA EVACUATIOIN;  Surgeon: Jodeen Munch, MD;  Location: [facility] ORS;  Service: Neurosurgery;  Laterality: Bilateral;  need ventriculostomy x 2   COLONOSCOPY WITH PROPOFOL  N/A 06/11/2021   Procedure: COLONOSCOPY WITH PROPOFOL ;  Surgeon: Shane Darling, MD;  Location: [facility] ENDOSCOPY;  Service: Endoscopy;  Laterality: N/A;  prefers afternoon Eliquis    COLONOSCOPY WITH PROPOFOL  N/A 12/19/2022   Procedure: COLONOSCOPY WITH PROPOFOL ;  Surgeon: Shane Darling, MD;  Location: [facility] ENDOSCOPY;  Service: Endoscopy;  Laterality: N/A;   ESOPHAGOGASTRODUODENOSCOPY N/A 06/11/2021   Procedure: ESOPHAGOGASTRODUODENOSCOPY (EGD);  Surgeon: Shane Darling, MD;  Location: [facility] ENDOSCOPY;  Service: Endoscopy;  Laterality: N/A;   EYE SURGERY Right    IVC FILTER REMOVAL N/A 07/21/2019   Procedure: IVC FILTER REMOVAL;  Surgeon: Celso College, MD;  Location: [facility] INVASIVE CV LAB;  Service: Cardiovascular;  Laterality: N/A;   LOWER EXTREMITY INTERVENTION Right 04/22/2019   Procedure: IVC Filter Insertion with Right Lower Extremity Venous Lysis;  Surgeon: Celso College, MD;  Location: [facility] INVASIVE CV LAB;  Service: Cardiovascular;  Laterality: Right;   OOPHORECTOMY       Social History:   reports that she quit smoking about 23 years ago. Her smoking use included cigarettes. She started smoking about 40 years  ago. She has a 17 pack-year smoking history. She has never used smokeless tobacco. She reports that she does not currently use alcohol. She reports that she does not use drugs.   Family History:  Her family history includes Heart attack in her father; Stomach cancer in her mother. There  is no history of Breast cancer.   Allergies Allergies  Allergen Reactions   Morphine And Codeine Anaphylaxis   Montelukast      Other reaction(s): Other (See Comments) Nightmares   Antihistamines, Diphenhydramine -Type Nausea Only   Aspirin Other (See Comments)    Patient on blood thinners and was told not to take   Benadryl  [Diphenhydramine  Hcl] Hives   Codeine Other (See Comments)    AMS     Home Medications  Prior to Admission medications   Medication Sig Start Date End Date Taking? Authorizing Provider  acetaminophen  (TYLENOL ) 325 MG tablet Take 2 tablets (650 mg total) by mouth every 6 (six) hours as needed for mild pain (pain score 1-3) (or Fever >/= 101). 03/03/24   Angiulli, Everlyn Hockey, PA-C  apixaban  (ELIQUIS ) 2.5 MG TABS tablet Take 1 tablet (2.5 mg total) by mouth 2 (two) times daily. 03/14/24   [name], MD  ascorbic acid  (VITAMIN C ) 1000 MG tablet Take 1 tablet (1,000 mg total) by mouth daily. 03/03/24   Angiulli, Everlyn Hockey, PA-C  cefTRIAXone  1 g in sodium chloride  0.9 % 100 mL Inject 1 g into the vein daily. 03/30/24   Sheril Dines, MD  cyanocobalamin  (VITAMIN B12) 1000 MCG tablet Take 1 tablet (1,000 mcg total) by mouth daily. 03/14/24   [name], MD  donepezil  (ARICEPT ) 10 MG tablet Take 1 tablet (10 mg total) by mouth at bedtime. 03/03/24   Angiulli, Everlyn Hockey, PA-C  ergocalciferol  (VITAMIN D2) 1.25 MG (50000 UT) capsule Take 1 capsule (50,000 Units total) by mouth every Friday. 03/04/24   Angiulli, Everlyn Hockey, PA-C  levETIRAcetam (KEPPRA) 500 MG/5ML undiluted injection Inject 15 mLs (1,500 mg total) into the vein every 12 (twelve) hours. 03/30/24   Sheril Dines, MD  levothyroxine  (SYNTHROID ) 125 MCG  tablet Take 1 tablet (125 mcg total) by mouth daily before breakfast. 03/03/24   Angiulli, Everlyn Hockey, PA-C  melatonin 5 MG TABS Take 1 tablet (5 mg total) by mouth at bedtime. 03/03/24   Angiulli, Everlyn Hockey, PA-C  methylphenidate  (RITALIN ) 10 MG tablet Take 1 tablet (10 mg total) by mouth 2 (two) times daily with breakfast and lunch. 03/15/24   Jodi Munroe, NP  rosuvastatin  (CRESTOR ) 20 MG tablet Take 1 tablet (20 mg total) by mouth daily. 03/03/24   Angiulli, Everlyn Hockey, PA-C  sertraline  (ZOLOFT ) 25 MG tablet Take 1 tablet (25 mg total) by mouth daily. 03/03/24   Angiulli, Everlyn Hockey, PA-C     Critical care time: additional 40 mins critical care time       CRITICAL CARE Performed by: Patt Boozer Elige Shouse   Total critical care time: 40 minutes  Critical care time was exclusive of separately billable procedures and treating other patients.  Critical care was necessary to treat or prevent imminent or life-threatening deterioration.  Critical care was time spent personally by me on the following activities: development of treatment plan with patient and/or surrogate as well as nursing, discussions with consultants, evaluation of patient's response to treatment, examination of patient, obtaining history from patient or surrogate, ordering and performing treatments and interventions, ordering and review of laboratory studies, ordering and review of radiographic studies, pulse oximetry and re-evaluation of patient's condition.     Patt Boozer Tekeya Geffert, PA-C [facility] See Amion for pager If no response to pager , please call [phone] until 7pm After 7:00 pm call Elink  [phone]

## 2024-03-31 NOTE — Progress Notes (Signed)
 NEUROLOGY CONSULT FOLLOW UP NOTE   Date of service: Mar 31, 2024 Patient Name: Alison Lopez MRN:  [id number] DOB:  10-Nov-1956  HPI / course 68 yo patient with hx dementia, antiphospholipid syndrome on low dose Eliquis  now due to recent spontaneous SDH in March s/p burr hole evacuation who presented 5/19 evening for complex partial seizures to [facility]. Head CT showed further decrease size of the previously seen mixed density left subdural hematoma, now 3 mm in thickness compared to 7 mm previously.    She was loaded on Keppra 20 mg/kg on admission and started on 500 mg twice daily.  She has had 2 complex partial seizures 5/01 both aborted with Ativan, for which she was given 2 g additional keppra and increased to 750 mg to be started this evening and then continued every 12 hours after that, esclated to max dose for renal function of 1000 mg BID on 5/21 evening   She was on Eliquis  5 mg twice daily when she had the subdural in March.  She has been restarted now as an outpatient on Eliquis  2.5 mg twice daily to reduce the risk of arterial and venous thrombosis.  This was held on admission due to thrombocytopenia with platelets of 59, but restarted by primary team on 5/21 evening.    MRI brain / MRV obtained to rule out acute infarct and/or venous sinus thrombosis contributing -- no CVST, but positive for small stroke [vs. Seizure related changes]    She is also on ceftriaxone  for UTI.    Continued to have declining mental status and focal seizures, 5/21 to 5/22 overnight and loaded with Vimpat, Phenobarb, intubated and started on Propofol  and Depakote  Interval Hx/subjective    Continued to have declining mental status and focal seizures, 5/21 to 5/22 overnight and loaded with Vimpat, Phenobarb, intubated and started on Propofol  and Depakote   Vitals   Vitals:   03/31/24 0645 03/31/24 0700 03/31/24 0715 03/31/24 0722  BP:  131/67 (!) 121/34   Pulse: (!) 118 (!) 104 99   Resp: 14 13 11   (!) 25  Temp:      TempSrc:      SpO2: 99% 100% 98%      There is no height or weight on file to calculate BMI.  Physical Exam   Constitutional: Appears frail and chronically ill  Psych: Not interactive Eyes: Mild scleral edema left > right HENT: ETT in place Head: Normocephalic. EEG in place Cardiovascular: Normal rate and regular rhythm.  Respiratory: breathing over the vent  Neuro:  Mental Status: Does not open eyes spontaneously, to voice or noxious stimulation Does not follow any commands Cranial Nerves: II: No blink to threat. Right pupil > left, both slightly reactive III,IV, VI/VIII: EOMI absent to VOR V/VII: Minimal blink to eyelash brush VIII: No response to voice X/XI: No cough/gag XII: Unable to assess tongue protrusion secondary to patient's mental status  Motor/Sensory: Slight movement in all four extremities to light noxious stim, grossly equal Cerebellar: Unable to assess secondary to patient's mental status   No right sided twitching at this time   Medications  Current Facility-Administered Medications:    0.9 %  sodium chloride  infusion (Manually program via Guardrails IV Fluids), , Intravenous, Once, Stacy Eagle, MD, Held at 03/31/24 0629   0.9 %  sodium chloride  infusion, 250 mL, Intravenous, Continuous, Paliwal, Aditya, MD, Held at 03/31/24 4696   acetaminophen  (TYLENOL ) 160 MG/5ML solution 650 mg, 650 mg, Per Tube, Q6H PRN **OR**  acetaminophen  (TYLENOL ) suppository 650 mg, 650 mg, Rectal, Q6H PRN, Claven Cumming, MD   apixaban  (ELIQUIS ) tablet 2.5 mg, 2.5 mg, Per Tube, BID, Claven Cumming, MD   cefTRIAXone  (ROCEPHIN ) 1 g in sodium chloride  0.9 % 100 mL IVPB, 1 g, Intravenous, Q24H, Faith Homes, MD, Last Rate: 200 mL/hr at 03/30/24 2052, 1 g at 03/30/24 2052   Chlorhexidine  Gluconate Cloth 2 % PADS 6 each, 6 each, Topical, Daily, Paliwal, Aditya, MD   docusate (COLACE) 50 MG/5ML liquid 100 mg, 100 mg, Per Tube, BID, Lemmie Pyo, NP    famotidine  (PEPCID ) tablet 20 mg, 20 mg, Per Tube, BID, Lemmie Pyo, NP   fentaNYL  (SUBLIMAZE ) injection 25 mcg, 25 mcg, Intravenous, Q15 min PRN, Lemmie Pyo, NP, 25 mcg at 03/31/24 1191   fentaNYL  (SUBLIMAZE ) injection 25-100 mcg, 25-100 mcg, Intravenous, Q30 min PRN, Lemmie Pyo, NP, 50 mcg at 03/31/24 4782   fentaNYL  in NS (76mcg/ml) infusion-PREMIX, 0-400 mcg/hr, Intravenous, Continuous, Claven Cumming, MD, Held at 03/31/24 [phone]   lacosamide (VIMPAT) 100 mg in sodium chloride  0.9 % 25 mL IVPB, 100 mg, Intravenous, BID, Khaliqdina, Salman, MD   levETIRAcetam (KEPPRA) undiluted injection 1,000 mg, 1,000 mg, Intravenous, BID, Khaliqdina, Salman, MD, 1,000 mg at 03/30/24 2252   levothyroxine  (SYNTHROID ) tablet 125 mcg, 125 mcg, Per Tube, Q0600, Claven Cumming, MD   midazolam  (VERSED ) injection 1-2 mg, 1-2 mg, Intravenous, Q1H PRN, Lemmie Pyo, NP   norepinephrine (LEVOPHED) 4mg  in (0.016 mg/mL) premix infusion, 0-40 mcg/min, Intravenous, Titrated, Claven Cumming, MD   Oral care mouth rinse, 15 mL, Mouth Rinse, Q2H, Claven Cumming, MD, 15 mL at 03/31/24 0754   Oral care mouth rinse, 15 mL, Mouth Rinse, PRN, Claven Cumming, MD   perampanel [facility]) tablet 8 mg, 8 mg, Oral, Once, Vernal Hritz L, MD   PHENYLephrine  80 mcg/ml in normal saline Adult IV Push Syringe (For Blood Pressure Support), 80-200 mcg, Intravenous, Once PRN, Paliwal, Aditya, MD   polyethylene glycol (MIRALAX  / GLYCOLAX ) packet 17 g, 17 g, Per Tube, Daily, Lemmie Pyo, NP   propofol  (DIPRIVAN ) 1000 MG/100ML infusion, 40 mcg/kg/min, Intravenous, Titrated, Khaliqdina, Salman, MD, Last Rate: 11.21 mL/hr at 03/31/24 0633, 40 mcg/kg/min at 03/31/24 1308   sodium chloride  flush (NS) 0.9 % injection 10-40 mL, 10-40 mL, Intracatheter, Q12H, Faith Homes, MD   sodium chloride  flush (NS) 0.9 % injection 10-40 mL, 10-40 mL, Intracatheter, PRN, Faith Homes, MD   sodium chloride  flush (NS) 0.9 % injection 3  mL, 3 mL, Intravenous, Q12H, Faith Homes, MD, 3 mL at 03/30/24 2328  Labs and Diagnostic Imaging    Basic Metabolic Panel: Recent Labs  Lab 03/28/24 2133 03/29/24 0738 03/30/24 0544 03/30/24 0545 03/30/24 1803 03/31/24 0536 03/31/24 0549  NA 135  --   --  145 144 141 145  K 2.8*  --   --  2.9* 3.0* 3.5 3.3*  CL 104  --   --  115* 115* 116*  --   CO2 22  --   --  22 21* 19*  --   GLUCOSE 112*  --   --  129* 109* 113*  --   BUN 15  --   --  <5* <5* <5*  --   CREATININE 0.69  --   --  0.62 0.80 0.72  --   CALCIUM  8.4*  --   --  7.8* 8.2* 8.0*  --   MG 1.8 1.5* 1.7  --  1.6*  --   --  PHOS 3.5  --  2.6  --   --   --   --     CBC: Recent Labs  Lab 03/28/24 04-30-32 03/30/24 0545 03/31/24 0536 03/31/24 0549  WBC 7.6 8.9 9.9  --   NEUTROABS 5.8  --   --   --   HGB 11.4* 10.2* 10.2* 9.5*  HCT 33.5* 31.1* 31.5* 28.0*  MCV 83.8 84.7 86.5  --   PLT 59* 54* 60*  --     Coagulation Studies: Recent Labs    03/28/24 30-Apr-2132  LABPROT 16.7*  INR 1.3*      Lipid Panel: No results found for: "CHOL", "HDL", "LDLCALC", "LDLDIRECT", "TRIG", "CHOLHDL"   HgbA1c:  Lab Results  Component Value Date   HGBA1C 4.8 01/30/2024   Urine Drug Screen:     Component Value Date/Time   LABOPIA NONE DETECTED 03/29/2024 0110   COCAINSCRNUR NONE DETECTED 03/29/2024 0110   LABBENZ POSITIVE (A) 03/29/2024 0110   AMPHETMU NONE DETECTED 03/29/2024 0110   THCU NONE DETECTED 03/29/2024 0110   LABBARB NONE DETECTED 03/29/2024 0110    Alcohol Level     Component Value Date/Time   ETH <10 01/29/2024 1307   INR  Lab Results  Component Value Date   INR 1.3 (H) 03/28/2024   APTT  Lab Results  Component Value Date   APTT 38 (H) 03/31/2024   AED levels: No results found for: "PHENYTOIN", "ZONISAMIDE", "LAMOTRIGINE", "LEVETIRACETA"  5/19 CT Head without contrast(Personally reviewed): Further decreased size of the previously seen mixed density left subdural hematoma, now 3 mm in  thickness compared to 7 mm previously. 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 Atrophy, chronic microvascular disease. No acute intracranial abnormality.   5/21 MRI Brain(Personally reviewed): Multiple scattered foci of infarct involving the cortex and subcortical white matter in the bilateral occipital lobes, parietal lobes, and left temporal occipital lobes. Infarcts likely of varying ages some of which appear subacute. Redemonstrated subdural collection over the left cerebral convexity measuring 3 mm in thickness. Dural thickening and enhancement over the left cerebral convexities likely reactive. No evidence of venous sinus thrombosis.  No significant stenosis. Moderate chronic microvascular ischemic changes. Multiple remote infarcts as above.  5/20 rEEG This EEG was obtained while awake and asleep and is abnormal due to mild-to-moderate diffuse slowing indicative of global cerebral dysfunction. Epileptiform abnormalities were not seen during this recording.   cEEG 03/30/2024 1759 to 03/31/2024 0830:  ABNORMALITY - Focal convulsive status epilepticus, left centro-parietal region - Burst suppression, generalized IMPRESSION: At the beginning of the study, patient had near continuous twitching of right shoulder. 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 Patient was intubated and started on propofol . Subsequently, status epilepticus resolved and was suggestive of profound diffuse encephalopathy likely due to sedation.  Assessment   Alison Lopez is a 68 y.o. female presenting w/ focal status in the setting of UTI  Recommendations   # Focal status w/ complex partial seizures, now in suppression - Continue LTM EEG - Keppra 1000 mg BID is max for her renal function; continue to monitor and increase if able - Vimpat 200 mg IV at 2256, now on 100 mg BID - Depakote 1000 mg at 0015 5/22; will try to avoid standing doses of this due  to the potential effect on platelets - Phenobarb 20 mg/kg at 0348 5/22 - Now on Propofol  and fentanyl  for sedation, consider transition to Precedex from propofol  if medical conditions allow - Would next add Fycompa  if seizures recur - Appreciate vent management and management of other co morbidities per primary  # antiphospholipid antibody syndrome # SDH, stable # Aortic central line - s/p 2 u platelets for thrombocytopenia - From neurology perspective, strokes are small and at least some of these may be seizure related changes, okay to continue low dose eliquis  when okay from medical perspective; depending on clinical course may repeat Head CT prior to initiation  - Appreciate vascular surgery eval  # UTI - appreciate antibiotics per CCM  # small strokes vs. at least some seizure related changes Likely related to her antiphospholipid antibody syndrome off of anticoagulation due to her spontaneous SDH.  - HgbA1c, fasting lipid panel - Continue home Crestor  20 mg nightly - CTA or MRA of the brain without and MRA neck w/wo to be ordered when more medically stable - Echocardiogram pending - Antiplatelets and anticoagulation being held pending medical stability - Telemetry monitoring - Blood pressure goal   - Normotension as out of the window for permissive hypertension - PT consult, OT consult, Speech consult, to be ordered pending medical stability ______________________________________________________________________   Baldwin Levee MD-PhD [facility] [phone] Available 7 AM to 7 PM, outside these hours please contact Neurologist on call listed on AMION   CRITICAL CARE Performed by: Ronnette Coke   Total critical care time: 35 minutes  Critical care time was exclusive of separately billable procedures and treating other patients.  Critical care was necessary to treat or prevent imminent or life-threatening deterioration.  Critical care was time spent  personally by me on the following activities: development of treatment plan with patient and/or surrogate as well as nursing, discussions with consultants, evaluation of patient's response to treatment, examination of patient, obtaining history from patient or surrogate, ordering and performing treatments and interventions, ordering and review of laboratory studies, ordering and review of radiographic studies, pulse oximetry and re-evaluation of patient's condition.

## 2024-03-31 NOTE — Plan of Care (Signed)
   Problem: Elimination: Goal: Will not experience complications related to bowel motility Outcome: Progressing

## 2024-03-31 NOTE — Consult Note (Signed)
 NAME:  Alison Lopez, MRN:  [id number], DOB:  1956/07/25, LOS: 1 ADMISSION DATE:  03/30/2024, CONSULTATION DATE: 5/22 REFERRING MD:  Dr Gladstone Lamer, CHIEF COMPLAINT: Seizure  History of Present Illness:  Patient is encephalopathic and/or intubated; therefore, history has been obtained from chart review.  68 year old female with past medical history as below, which is significant for COPD, dementia, diabetes, DVT, antiphospholipid syndrome and DVT on Eliquis , hypertension, lupus, hypothyroidism, and recent admission for subdural hematoma in April 2025 with bur hole evacuation.  Residual right-sided weakness.  She was discharged from SNF about 1 week prior to her presentation 5/21 with chief complaint of new onset seizure and altered mental status.  Seizures were indeed witnessed in the emergency department and she was given Ativan and Keppra.  She continued to have seizures despite treatment and was given additional Ativan and Keppra.  She was also started on ceftriaxone  for UTI.  Seizures were focal in nature with shoulder and facial twitching.  CT of the head showed decreasing subdural size.  MRI MRV showed multiple scattered foci of small infarcts.  She was [facility] from [facility] to [facility] for continuous EEG monitoring.  She continued to seize even despite Vimpat load and was transferred to ICU for phenobarbital ministration and concern for airway.  PCCM is consulted.   Pertinent  Medical History   has a past medical history of Anxiety, Arthritis, Collagen vascular disease (HCC), COPD (chronic obstructive pulmonary disease) (HCC), DDD (degenerative disc disease), lumbar, Dementia (HCC), Depression, Diabetes mellitus without complication (HCC), DVT (deep venous thrombosis) (HCC), Fibromyalgia, GERD (gastroesophageal reflux disease), Graves' disease with exophthalmos, HLD (hyperlipidemia), Hypertension, Hypothyroidism, Lupus, and Osteoporosis.   Significant Hospital Events: Including procedures,  antibiotic start and stop dates in addition to other pertinent events     Interim History / Subjective:    Objective    Blood pressure (!) 128/106, pulse 62, temperature 98.3 F (36.8 C), temperature source Axillary, resp. rate (!) 22, SpO2 93%.        Intake/Output Summary (Last 24 hours) at 03/31/2024 0336 Last data filed at 03/31/2024 0000 Gross per 24 hour  Intake 455.36 ml  Output --  Net 455.36 ml   There were no vitals filed for this visit.  Examination: General: Frail adult female in no acute distress HENT: Normocephalic, atraumatic, PERRL Lungs: Clear bilateral breath sounds Cardiovascular: Regular rate and rhythm Abdomen: Soft, nontender, nondistended Extremities: No acute deformity.  Some mild mottling to bilateral lower extremities.  Distal pulses intact. Neuro: Somnolent, arouses to tactile stimuli, follows commands and attempts to mouth words.   Resolved problem list   Assessment and Plan   Focal status epilepticus - AEDs per neurology.  She has received Keppra, Fycompa, Vimpat, and is now being given phenobarbital. - At risk for airway protection and may ultimately need intubation - Continuous EEG - Close monitoring of airway  Recent subdural hematoma: Status post bur hole evacuation in March.  Size reduced on CT now Multifocal bilateral small strokes: Some of which may be seizure related change - Okay to continue low-dose Eliquis  per neurology - Echocardiogram pending  Sepsis secondary to acute cystitis: Urine culture from 5/20 grew showing 30K colonies of E. coli - Continue Rocephin   Antiphospholipid syndrome with history of DVT on chronic Eliquis  - Continue low-dose Eliquis  despite chronic thrombocytopenia  Hypokalemia Hypomagnesemia - Received 6 rounds of K repeat BMP with a.m. labs - 2 g of mag given  Hypothyroid - Synthroid   Best Practice (right click and "Reselect  all SmartList Selections" daily)   Diet/type: NPO DVT prophylaxis  DOAC Pressure ulcer(s): pressure ulcer assessment deferred  GI prophylaxis: N/A Lines: N/A Foley:  N/A Code Status:  full code Last date of multidisciplinary goals of care discussion [ ]   Labs   CBC: Recent Labs  Lab 03/28/24 2133 03/30/24 0545  WBC 7.6 8.9  NEUTROABS 5.8  --   HGB 11.4* 10.2*  HCT 33.5* 31.1*  MCV 83.8 84.7  PLT 59* 54*    Basic Metabolic Panel: Recent Labs  Lab 03/28/24 2133 03/29/24 0738 03/30/24 0544 03/30/24 0545 03/30/24 1803  NA 135  --   --  145 144  K 2.8*  --   --  2.9* 3.0*  CL 104  --   --  115* 115*  CO2 22  --   --  22 21*  GLUCOSE 112*  --   --  129* 109*  BUN 15  --   --  <5* <5*  CREATININE 0.69  --   --  0.62 0.80  CALCIUM  8.4*  --   --  7.8* 8.2*  MG 1.8 1.5* 1.7  --  1.6*  PHOS 3.5  --  2.6  --   --    GFR: Estimated Creatinine Clearance: 43.8 mL/min (by C-G formula based on SCr of 0.8 mg/dL). Recent Labs  Lab 03/28/24 2133 03/29/24 0110 03/30/24 0545  WBC 7.6  --  8.9  LATICACIDVEN 1.3 1.3  --     Liver Function Tests: Recent Labs  Lab 03/28/24 2133  AST 19  ALT 10  ALKPHOS 54  BILITOT 0.7  PROT 6.2*  ALBUMIN 3.0*   No results for input(s): "LIPASE", "AMYLASE" in the last 168 hours. No results for input(s): "AMMONIA" in the last 168 hours.  ABG    Component Value Date/Time   HCO3 25.9 02/17/2024 0428   O2SAT 65.3 02/17/2024 0428     Coagulation Profile: Recent Labs  Lab 03/28/24 2133  INR 1.3*    Cardiac Enzymes: Recent Labs  Lab 03/29/24 0738  CKTOTAL 49    HbA1C: Hgb A1c MFr Bld  Date/Time Value Ref Range Status  01/30/2024 03:11 AM 4.8 4.8 - 5.6 % Final    Comment:    (NOTE) Pre diabetes:          5.7%-6.4%  Diabetes:              >6.4%  Glycemic control for   <7.0% adults with diabetes   04/22/2019 07:30 AM 6.0 (H) 4.8 - 5.6 % Final    Comment:    (NOTE) Pre diabetes:          5.7%-6.4% Diabetes:              >6.4% Glycemic control for   <7.0% adults with diabetes      CBG: Recent Labs  Lab 03/29/24 0227 03/29/24 0427 03/29/24 1651 03/30/24 1717 03/31/24 0303  GLUCAP 82 77 137* 91 81    Review of Systems:   Patient is encephalopathic and/or intubated; therefore, history has been obtained from chart review.    Past Medical History:  She,  has a past medical history of Anxiety, Arthritis, Collagen vascular disease (HCC), COPD (chronic obstructive pulmonary disease) (HCC), DDD (degenerative disc disease), lumbar, Dementia (HCC), Depression, Diabetes mellitus without complication (HCC), DVT (deep venous thrombosis) (HCC), Fibromyalgia, GERD (gastroesophageal reflux disease), Graves' disease with exophthalmos, HLD (hyperlipidemia), Hypertension, Hypothyroidism, Lupus, and Osteoporosis.   Surgical History:   Past Surgical History:  Procedure  Laterality Date   ABDOMINAL HYSTERECTOMY     BURR HOLE Bilateral 01/30/2024   Procedure: CREATION, CRANIAL BURR HOLE, SUBDURAL HEMATOMA EVACUATIOIN;  Surgeon: Jodeen Munch, MD;  Location: [facility] ORS;  Service: Neurosurgery;  Laterality: Bilateral;  need ventriculostomy x 2   COLONOSCOPY WITH PROPOFOL  N/A 06/11/2021   Procedure: COLONOSCOPY WITH PROPOFOL ;  Surgeon: Shane Darling, MD;  Location: [facility] ENDOSCOPY;  Service: Endoscopy;  Laterality: N/A;  prefers afternoon Eliquis    COLONOSCOPY WITH PROPOFOL  N/A 12/19/2022   Procedure: COLONOSCOPY WITH PROPOFOL ;  Surgeon: Shane Darling, MD;  Location: [facility] ENDOSCOPY;  Service: Endoscopy;  Laterality: N/A;   ESOPHAGOGASTRODUODENOSCOPY N/A 06/11/2021   Procedure: ESOPHAGOGASTRODUODENOSCOPY (EGD);  Surgeon: Shane Darling, MD;  Location: [facility] ENDOSCOPY;  Service: Endoscopy;  Laterality: N/A;   EYE SURGERY Right    IVC FILTER REMOVAL N/A 07/21/2019   Procedure: IVC FILTER REMOVAL;  Surgeon: Celso College, MD;  Location: [facility] INVASIVE CV LAB;  Service: Cardiovascular;  Laterality: N/A;   LOWER EXTREMITY INTERVENTION Right 04/22/2019   Procedure: IVC Filter  Insertion with Right Lower Extremity Venous Lysis;  Surgeon: Celso College, MD;  Location: [facility] INVASIVE CV LAB;  Service: Cardiovascular;  Laterality: Right;   OOPHORECTOMY       Social History:   reports that she quit smoking about 23 years ago. Her smoking use included cigarettes. She started smoking about 40 years ago. She has a 17 pack-year smoking history. She has never used smokeless tobacco. She reports that she does not currently use alcohol. She reports that she does not use drugs.   Family History:  Her family history includes Heart attack in her father; Stomach cancer in her mother. There is no history of Breast cancer.   Allergies Allergies  Allergen Reactions   Morphine And Codeine Anaphylaxis   Montelukast      Other reaction(s): Other (See Comments) Nightmares   Antihistamines, Diphenhydramine -Type Nausea Only   Aspirin Other (See Comments)    Patient on blood thinners and was told not to take   Benadryl  [Diphenhydramine  Hcl] Hives   Codeine Other (See Comments)    AMS     Home Medications  Prior to Admission medications   Medication Sig Start Date End Date Taking? Authorizing Provider  acetaminophen  (TYLENOL ) 325 MG tablet Take 2 tablets (650 mg total) by mouth every 6 (six) hours as needed for mild pain (pain score 1-3) (or Fever >/= 101). 03/03/24   Angiulli, Everlyn Hockey, PA-C  apixaban  (ELIQUIS ) 2.5 MG TABS tablet Take 1 tablet (2.5 mg total) by mouth 2 (two) times daily. 03/14/24   [name], MD  ascorbic acid  (VITAMIN C ) 1000 MG tablet Take 1 tablet (1,000 mg total) by mouth daily. 03/03/24   Angiulli, Everlyn Hockey, PA-C  cefTRIAXone  1 g in sodium chloride  0.9 % 100 mL Inject 1 g into the vein daily. 03/30/24   Sheril Dines, MD  cyanocobalamin  (VITAMIN B12) 1000 MCG tablet Take 1 tablet (1,000 mcg total) by mouth daily. 03/14/24   [name], MD  donepezil  (ARICEPT ) 10 MG tablet Take 1 tablet (10 mg total) by mouth at bedtime. 03/03/24   Angiulli, Everlyn Hockey, PA-C  ergocalciferol   (VITAMIN D2) 1.25 MG (50000 UT) capsule Take 1 capsule (50,000 Units total) by mouth every Friday. 03/04/24   Angiulli, Everlyn Hockey, PA-C  levETIRAcetam (KEPPRA) 500 MG/5ML undiluted injection Inject 15 mLs (1,500 mg total) into the vein every 12 (twelve) hours. 03/30/24   Sheril Dines, MD  levothyroxine  (SYNTHROID ) 125 MCG tablet  Take 1 tablet (125 mcg total) by mouth daily before breakfast. 03/03/24   Angiulli, Everlyn Hockey, PA-C  melatonin 5 MG TABS Take 1 tablet (5 mg total) by mouth at bedtime. 03/03/24   Angiulli, Everlyn Hockey, PA-C  methylphenidate  (RITALIN ) 10 MG tablet Take 1 tablet (10 mg total) by mouth 2 (two) times daily with breakfast and lunch. 03/15/24   Jodi Munroe, NP  rosuvastatin  (CRESTOR ) 20 MG tablet Take 1 tablet (20 mg total) by mouth daily. 03/03/24   Angiulli, Everlyn Hockey, PA-C  sertraline  (ZOLOFT ) 25 MG tablet Take 1 tablet (25 mg total) by mouth daily. 03/03/24   Sterling Eisenmenger, PA-C     Critical care time: 37 min      [name], AGACNP-BC [facility]  See Amion for personal pager PCCM on call pager [phone] until 7pm. Please call Elink 7p-7a. [phone]  03/31/2024 3:53 AM

## 2024-03-31 NOTE — Consult Note (Addendum)
 Hospital Consult    Reason for Consult:  central venous catheter misplacement Requesting Physician:  Lora Robinsons, PA-C MRN #:  [id number]  History of Present Illness: Alison Lopez is a 68 y.o. female with a PMH of COPD, dementia, diabetes, DVT and antiphospholipid syndrome on Eliquis , lupus, and hypertension who presented to [facility] on 5/19 with new onset seizures and altered mental status.  She was also recently admitted for subdural hematoma in April 2025 with bur hole evacuation.  She does have residual right-sided weakness.    Upon presentation to [facility] she continued to have seizures despite Ativan and Keppra.  She was ultimately transferred to [facility] yesterday for continuous EEG monitoring.  She also was given Vimpat and continued to seize. She was transferred to the ICU for phenobarbital administration and protected airway.   We were consulted for a misplaced central venous catheter.  Overnight there was an attempt to place a central venous catheter into the subclavian, however this was accidentally placed into the left subclavian artery.  The patient remains sedated and intubated.  Past Medical History:  Diagnosis Date   Anxiety    Arthritis    Collagen vascular disease (HCC)    COPD (chronic obstructive pulmonary disease) (HCC)    DDD (degenerative disc disease), lumbar    Dementia (HCC)    Depression    Diabetes mellitus without complication (HCC)    DVT (deep venous thrombosis) (HCC)    Fibromyalgia    GERD (gastroesophageal reflux disease)    Graves' disease with exophthalmos    HLD (hyperlipidemia)    Hypertension    Hypothyroidism    Lupus    Osteoporosis     Past Surgical History:  Procedure Laterality Date   ABDOMINAL HYSTERECTOMY     BURR HOLE Bilateral 01/30/2024   Procedure: CREATION, CRANIAL BURR HOLE, SUBDURAL HEMATOMA EVACUATIOIN;  Surgeon: Jodeen Munch, MD;  Location: [facility] ORS;  Service: Neurosurgery;  Laterality: Bilateral;  need ventriculostomy  x 2   COLONOSCOPY WITH PROPOFOL  N/A 06/11/2021   Procedure: COLONOSCOPY WITH PROPOFOL ;  Surgeon: Shane Darling, MD;  Location: [facility] ENDOSCOPY;  Service: Endoscopy;  Laterality: N/A;  prefers afternoon Eliquis    COLONOSCOPY WITH PROPOFOL  N/A 12/19/2022   Procedure: COLONOSCOPY WITH PROPOFOL ;  Surgeon: Shane Darling, MD;  Location: [facility] ENDOSCOPY;  Service: Endoscopy;  Laterality: N/A;   ESOPHAGOGASTRODUODENOSCOPY N/A 06/11/2021   Procedure: ESOPHAGOGASTRODUODENOSCOPY (EGD);  Surgeon: Shane Darling, MD;  Location: [facility] ENDOSCOPY;  Service: Endoscopy;  Laterality: N/A;   EYE SURGERY Right    IVC FILTER REMOVAL N/A 07/21/2019   Procedure: IVC FILTER REMOVAL;  Surgeon: Celso College, MD;  Location: [facility] INVASIVE CV LAB;  Service: Cardiovascular;  Laterality: N/A;   LOWER EXTREMITY INTERVENTION Right 04/22/2019   Procedure: IVC Filter Insertion with Right Lower Extremity Venous Lysis;  Surgeon: Celso College, MD;  Location: [facility] INVASIVE CV LAB;  Service: Cardiovascular;  Laterality: Right;   OOPHORECTOMY      Allergies  Allergen Reactions   Morphine And Codeine Anaphylaxis   Montelukast  Other (See Comments)    Nightmares   Antihistamines, Diphenhydramine -Type Nausea Only   Aspirin Other (See Comments)    Patient on blood thinners and was told not to take   Benadryl  [Diphenhydramine  Hcl] Hives   Codeine Other (See Comments)    AMS    Prior to Admission medications   Medication Sig Start Date End Date Taking? Authorizing Provider  acetaminophen  (TYLENOL ) 325 MG tablet Take 2 tablets (650 mg total) by  mouth every 6 (six) hours as needed for mild pain (pain score 1-3) (or Fever >/= 101). Patient taking differently: Take 325 mg by mouth daily as needed for mild pain (pain score 1-3), headache or moderate pain (pain score 4-6). 03/03/24  Yes Angiulli, Everlyn Hockey, PA-C  apixaban  (ELIQUIS ) 2.5 MG TABS tablet Take 1 tablet (2.5 mg total) by mouth 2 (two) times daily. 03/14/24  Yes [name],  MD  ascorbic acid  (VITAMIN C ) 1000 MG tablet Take 1 tablet (1,000 mg total) by mouth daily. 03/03/24  Yes Angiulli, Everlyn Hockey, PA-C  cyanocobalamin  (VITAMIN B12) 1000 MCG tablet Take 1 tablet (1,000 mcg total) by mouth daily. 03/14/24  Yes [name], MD  donepezil  (ARICEPT ) 10 MG tablet Take 1 tablet (10 mg total) by mouth at bedtime. 03/03/24  Yes Angiulli, Everlyn Hockey, PA-C  ergocalciferol  (VITAMIN D2) 1.25 MG (50000 UT) capsule Take 1 capsule (50,000 Units total) by mouth every Friday. 03/04/24  Yes Angiulli, Daniel J, PA-C  levothyroxine  (SYNTHROID ) 125 MCG tablet Take 1 tablet (125 mcg total) by mouth daily before breakfast. 03/03/24  Yes Angiulli, Everlyn Hockey, PA-C  melatonin 5 MG TABS Take 1 tablet (5 mg total) by mouth at bedtime. Patient taking differently: Take 5 mg by mouth at bedtime as needed (sleep). 03/03/24  Yes Angiulli, Everlyn Hockey, PA-C  methylphenidate  (RITALIN ) 10 MG tablet Take 1 tablet (10 mg total) by mouth 2 (two) times daily with breakfast and lunch. 03/15/24  Yes Jodi Munroe, NP  rosuvastatin  (CRESTOR ) 20 MG tablet Take 1 tablet (20 mg total) by mouth daily. 03/03/24  Yes Angiulli, Everlyn Hockey, PA-C  sertraline  (ZOLOFT ) 25 MG tablet Take 1 tablet (25 mg total) by mouth daily. 03/03/24  Yes Angiulli, Everlyn Hockey, PA-C  cefTRIAXone  1 g in sodium chloride  0.9 % 100 mL Inject 1 g into the vein daily. Patient not taking: Reported on 03/31/2024 03/30/24   Sheril Dines, MD  ferrous sulfate  325 (65 FE) MG EC tablet Take 325 mg by mouth daily. Patient not taking: Reported on 03/31/2024    [provider]  levETIRAcetam (KEPPRA) 500 MG/5ML undiluted injection Inject 15 mLs (1,500 mg total) into the vein every 12 (twelve) hours. Patient not taking: Reported on 03/31/2024 03/30/24   Sheril Dines, MD    Social History   Socioeconomic History   Marital status: Divorced    Spouse name: Not on file   Number of children: Not on file   Years of education: Not on file   Highest education level:  Not on file  Occupational History   Not on file  Tobacco Use   Smoking status: Former    Current packs/day: 0.00    Average packs/day: 1 pack/day for 17.0 years (17.0 ttl pk-yrs)    Types: Cigarettes    Start date: 46    Quit date: 2002    Years since quitting: 23.4   Smokeless tobacco: Never  Vaping Use   Vaping status: Never Used  Substance and Sexual Activity   Alcohol use: Not Currently   Drug use: Never   Sexual activity: Not Currently    Birth control/protection: None  Other Topics Concern   Not on file  Social History Narrative   Not on file   Social Drivers of Health   Financial Resource Strain: Not on file  Food Insecurity: Patient Unable To Answer (03/29/2024)   Hunger Vital Sign    Worried About Running Out of Food in the Last Year: Patient unable to answer  Ran Out of Food in the Last Year: Patient unable to answer  Transportation Needs: Patient Unable To Answer (03/29/2024)   PRAPARE - Transportation    Lack of Transportation (Medical): Patient unable to answer    Lack of Transportation (Non-Medical): Patient unable to answer  Physical Activity: Not on file  Stress: Not on file  Social Connections: Patient Unable To Answer (03/29/2024)   Social Connection and Isolation Panel [NHANES]    Frequency of Communication with Friends and Family: Patient unable to answer    Frequency of Social Gatherings with Friends and Family: Patient unable to answer    Attends Religious Services: Patient unable to answer    Active Member of Clubs or Organizations: Patient unable to answer    Attends Banker Meetings: Patient unable to answer    Marital Status: Patient unable to answer  Recent Concern: Social Connections - Socially Isolated (02/16/2024)   Social Connection and Isolation Panel [NHANES]    Frequency of Communication with Friends and Family: More than three times a week    Frequency of Social Gatherings with Friends and Family: More than three times a  week    Attends Religious Services: Never    Database administrator or Organizations: No    Attends Banker Meetings: Never    Marital Status: Divorced  Catering manager Violence: Patient Unable To Answer (03/29/2024)   Humiliation, Afraid, Rape, and Kick questionnaire    Fear of Current or Ex-Partner: Patient unable to answer    Emotionally Abused: Patient unable to answer    Physically Abused: Patient unable to answer    Sexually Abused: Patient unable to answer     Family History  Problem Relation Age of Onset   Stomach cancer Mother    Heart attack Father    Breast cancer Neg Hx     ROS: Otherwise negative unless mentioned in HPI  Physical Examination  Vitals:   03/31/24 1245 03/31/24 1300  BP: (!) 100/50 (!) 83/43  Pulse: 65 63  Resp: (!) 23 (!) 25  Temp:    SpO2: 100% 100%   There is no height or weight on file to calculate BMI.  General:  intubated and sedated Gait: Not observed HENT: WNL, normocephalic. Continuous EEG electrodes Pulmonary: intubated Cardiac: regular, arterial waveforms from left subclavian line Vascular Exam/Pulses: 2+ right radial pulse. Nonpalpable left brachial or radial pulses. Brisk left monophasic radial doppler signal. 2+ femoral pulses. Brisk PT doppler signals bilaterally Extremities: without ischemic changes, without gangrene , without cellulitis; without open wounds; venous catheter in place in left chest wall without bleeding or hematoma Musculoskeletal: no muscle wasting or atrophy  Neurologic: sedated  CBC    Component Value Date/Time   WBC 9.9 03/31/2024 0536   RBC 3.64 (L) 03/31/2024 0536   HGB 8.5 (L) 03/31/2024 0919   HGB 12.5 03/14/2024 1014   HCT 25.0 (L) 03/31/2024 0919   PLT 60 (L) 03/31/2024 0536   PLT 123 (L) 03/14/2024 1014   MCV 86.5 03/31/2024 0536   MCH 28.0 03/31/2024 0536   MCHC 32.4 03/31/2024 0536   RDW 14.6 03/31/2024 0536   LYMPHSABS 1.1 03/28/2024 2133   MONOABS 0.3 03/28/2024 2133    EOSABS 0.2 03/28/2024 2133   BASOSABS 0.1 03/28/2024 2133    BMET    Component Value Date/Time   NA 144 03/31/2024 0919   K 3.1 (L) 03/31/2024 0919   CL 116 (H) 03/31/2024 0536   CO2 19 (L) 03/31/2024 0536  GLUCOSE 113 (H) 03/31/2024 0536   BUN <5 (L) 03/31/2024 0536   CREATININE 0.72 03/31/2024 0536   CREATININE 0.91 03/14/2024 1014   CALCIUM  8.0 (L) 03/31/2024 0536   GFRNONAA >60 03/31/2024 0536   GFRNONAA >60 03/14/2024 1014   GFRAA >60 08/03/2020 1013    COAGS: Lab Results  Component Value Date   INR 1.3 (H) 03/28/2024   INR 1.1 02/16/2024   INR 1.2 01/29/2024     Statin:  Yes.   Beta Blocker:  No. Aspirin:  No. ACEI:  No. ARB:  No. CCB use:  No Other antiplatelets/anticoagulants:  Yes.   Eliquis , last known dose 5/19   ASSESSMENT/PLAN: This is a 68 y.o. female admitted for recurrent seizures   - The patient presented to [facility] 3 days ago with new onset altered mental status and seizures.  She continued to have seizures despite several doses of Ativan and Keppra.  She ultimately was transferred to the ICU at [facility] for airway protection, phenobarbital administration, and continuous EEG monitoring - Early this morning there was an attempt to place a central venous catheter into the left subclavian vein. CXR confirms this was likely inadvertently placed into the left subclavian artery.  There are arterial waveforms coming off this line, consistent with a misplaced catheter into the artery. - On exam she is critically ill, intubated, and sedated.  The venous catheter is secured to her left chest wall without bleeding or hematoma.  She has palpable femoral pulses bilaterally and brisk PT Doppler signals bilaterally.  She has a 2+ right radial pulse.  She does not have any palpable left brachial or radial pulses.  She does have a brisk monophasic left radial Doppler signal - The patient was also seen with Dr. Fulton Job.  We will obtain stat CTA chest to determine where the  central line is - She will require removal of the central line in the OR today. Surgical approach will likely require stenting. Further surgical decisions can be made after CTA results -She has a history of chronic thrombocytopenia with most recent plt count at 60k. She also has a history of antiphospholipid syndrome chronically anticoagulated on Eliquis . Last known dose of Eliquis  was on 5/19. Her most recent HGB was 8.5   Deneise Finlay PA-C [facility] [phone]   I have seen and evaluated the patient. I agree with the PA note as documented above.  68 year old female that vascular surgery has been consulted for central line placement in the left subclavian artery.  She was transferred here from [place] due to complex seizures.  Recently underwent bur hole for evacuation for chronic SDH in March.  Now intubated with EEG monitoring.  Central line was placed sometime around 5 to 6 AM by critical care.  Appears to be a triple-lumen in the left subclavian artery.  Chest x-ray suggests arterial placement based on location.  Waveforms also confirm arterial placement.  I have recommended a stat CTA chest to see exactly where the catheter was placed proximally in the chest and the proximity to the vertebral artery.  I have tentatively posted her for the OR for aortogram, left upper extremity arteriogram with likely left subclavian stenting using a covered stent.  Will be high risk for intervention including stroke risk.  I will call and update her family.  Young Hensen, MD [facility] [place] Office: [phone]

## 2024-03-31 NOTE — Progress Notes (Signed)
 LTM maint complete - no skin breakdown under:  O1,O2,P4

## 2024-03-31 NOTE — Progress Notes (Signed)
 vLTM maintenance  All impedances below 10k  No skin breakdown noted at P4 C4  F4 FP1  FP2

## 2024-03-31 NOTE — Procedures (Signed)
 Central Venous Catheter Insertion Procedure Note  Alison Lopez  [id number]  June 16, 1956  Date:03/31/24  Time:5:38 AM   Provider Performing:Jomarion Mish Mason Sole   Procedure: Insertion of Non-tunneled Central Venous Catheter(36556) without US  guidance  Indication(s) Medication administration  Consent Unable to obtain consent due to emergent nature of procedure.  Anesthesia Topical only with 1% lidocaine    Timeout Verified patient identification, verified procedure, site/side was marked, verified correct patient position, special equipment/implants available, medications/allergies/relevant history reviewed, required imaging and test results available.  Sterile Technique Maximal sterile technique including full sterile barrier drape, hand hygiene, sterile gown, sterile gloves, mask, hair covering, sterile ultrasound probe cover (if used).  Procedure Description Area of catheter insertion was cleaned with chlorhexidine  and draped in sterile fashion.  Without real-time ultrasound guidance a central venous catheter was placed into the left subclavian vein. Nonpulsatile blood flow and easy flushing noted in all ports.  The catheter was sutured in place and sterile dressing applied.  Complications/Tolerance Left subclavian vein CVC attempted however, catheter seems to be in the subclavian artery per prelim cxr assess, awaiting final official report.  Will hook to arterial line and get ABG to asses if catheter in artery. Chest X-ray is ordered to verify placement for internal jugular or subclavian cannulation.   Chest x-ray is not ordered for femoral cannulation.  EBL 5-10cc  Specimen(s) None

## 2024-03-31 NOTE — Progress Notes (Addendum)
 eLink Physician-Brief Progress Note Patient Name: Alison Lopez DOB: 1955-12-26 MRN: [id number]   Date of Service  03/31/2024  HPI/Events of Note  68 year old female with a history of collagen vascular disease, COPD, DVTs, dementia who presented after recent admission for subdural hematoma with right-sided focal weakness and new onset seizure-like activity complicated by encephalopathy.  She was loaded on AEDs but eventually became obtunded and required intubation with mechanical ventilation on the floor.  Patient is hypotensive but otherwise normal vitals.  Saturating 100% and is now initiated on low-dose norepinephrine and electrolyte replacements.  Results show multiple electrolyte disturbances, MRI with multiple areas of subacute infarct with microvascular changes.  eICU Interventions  Maintain mechanical ventilation, spontaneous awakening/breathing trials per neurology.  Will follow ABG and chest radiograph.  Discussed clinical decline with patient's daughter [name] who was amenable to proceeding.  She will attempt to come to bedside this afternoon.  Continuous EEG monitoring, AEDs-Keppra, Vimpat, and dose of phenobarbital.  Electrolyte replacements in place, norepinephrine as needed to maintain MAP greater than 65  DVT prophylaxis with Eliquis  GI prophylaxis with famotidine    0501 -fairly substantial bleeding during central line insertion, ordered 2 units of emergent platelets.  Blood bank is actively preparing it and type and screen will be sent shortly  Intervention Category Evaluation Type: New Patient Evaluation  Flay Ghosh 03/31/2024, 4:34 AM

## 2024-03-31 NOTE — Op Note (Signed)
    Patient name: DENESHA BROUSE MRN: [id number] DOB: [birth year] Sex: female  03/31/2024 Pre-operative Diagnosis: Central line placement left subclavian artery Post-operative diagnosis:  Same Surgeon:  Young Hensen, MD Assistant: Delaney Fearing, MD and Deneise Finlay, [place] Procedure Performed: 1.  Ultrasound-guided access right common femoral artery 2.  Catheter selection of left subclavian artery 3.  Left upper extremity arteriogram 4.  Angioplasty and stent of left subclavian artery (7 mm x 29 mm VBX) 5.  Removal left subclavian arterial line catheter  6.  Mynx closure of the right common femoral artery  Indications: Patient is a 68 year old female transferred here from [place] with seizures.  She was admitted to the ICU.  Critical care placed a left subclavian line earlier this morning.  This was noted to be in the subclavian artery based on chest x-ray and arterial line evaluation.  CTA confirmed this was in the left subclavian artery.  She presents for line removal and subclavian artery stenting after risks benefits discussed with her daughter.  Findings:   Ultrasound-guided access right common femoral artery.  Catheter selection of the left subclavian artery.  Ultimately the catheter was removed and angiogram confirmed an active blush and this was stented with a covered stent using a 7 mm x 29 mm VBX.  Widely patent stent at completion with no active bleed at completion.  Mynx closure of the right common femoral artery.   Procedure:  The patient was identified and taken to OR 16.  She was placed on the operating table in the supine position.  Bilateral groins were prepped and draped in standard sterile fashion.  A timeout was performed as well as antibiotics were updated.  I then used a sterile ultrasound to evaluate the right common femoral artery, it was patent, an image was saved.  This was accessed with micro access needle, placed a microwire, and a microsheath.  I then used a  Glidewire advantage and upsized to a long 6 [place] sheath that was advanced all the way into the aortic arch.  I then used a vert catheter to select the left subclavian artery and advanced a Glidewire advantage out past the subclavian catheter.  I then advanced my sheath all the way into the left subclavian artery past the ostium.  The patient was given 3000 units of IV heparin .  We selected a 7 mm x 29 mm VBX that was then advanced over the wire into the left subclavian artery at the level of the catheter.  Dr. Susi Eric then scrubbed out and removed the catheter from the left subclavian artery and we shot a quick angiogram showing the active blush.  This was then treated by deployment of the 7 mm x 29 mm VBX with hemostasis on final imaging.  Wires and catheters were removed and a put a short 5 French sheath in the right groin.  Mynx closure was deployed.  Taken back to the ICU intubated.   Plan: Aspirin when able given thrombocytopenia.  Young Hensen, MD [facility] [place] Office: [phone]

## 2024-03-31 NOTE — Progress Notes (Signed)
 Patient has been having seizure activities before shift change involving twitching of rt shoulder and intermittent crying out loud. Pupils are unequal, non -reactive on the right. Does not follow command. Positive gag reflex. Neuro notified with multiple interventions given. Patient did rest from 2330 to around 0219 when seizure activities started again with right shoulder twitching and verbal crying. Neuro notified and patient is transferred to 4N21.

## 2024-03-31 NOTE — Procedures (Addendum)
 Patient Name: Alison Lopez  MRN: [id number]  Epilepsy Attending: Arleene Lack  Referring Physician/Provider: Ronnette Coke, MD  Duration: 03/30/2024 1759 to 03/31/2024 1759  Patient history: 68 y.o. female with a history of seizure who is undergoing an EEG to evaluate for seizures.  Level of alertness:  comatose  AEDs during EEG study: LEV, Perampanel, PHT, LCM, Phenobarb,VPA, Propofol   Technical aspects: This EEG study was done with scalp electrodes positioned according to the 10-20 International system of electrode placement. Electrical activity was reviewed with band pass filter of 1-70Hz , sensitivity of 7 uV/mm, display speed of 36mm/sec with a 60Hz  notched filter applied as appropriate. EEG data were recorded continuously and digitally stored.  Video monitoring was available and reviewed as appropriate.  Description: At the beginning of the study, patient reportedly had near continuous twitching of right shoulder. 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 EEG was disconnected between 03/31/2024 0242 to 0609 as patient was moved to ICU. Patient was intubated and started on propofol . Subsequently, status epilepticus resolved and showed burst suppression pattern with generalized bursts of 3-6hz  theta-delta slowing lasting 2-5 seconds alternating with generalized suppression lasting 3-5 seconds. Sharp waves were noted in left hemisphere, maximal left temporal region. Hyperventilation and photic stimulation were not performed.     ABNORMALITY - Focal convulsive status epilepticus, left centro-parietal region - Burst suppression, generalized  IMPRESSION:  At the beginning of the study, patient had near continuous twitching of  right shoulder. 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 Patient was intubated and started on propofol . Subsequently, status epilepticus resolved and was suggestive of profound diffuse encephalopathy likely due to sedation.  Delphina Schum O Ceara Wrightson

## 2024-03-31 NOTE — Anesthesia Preprocedure Evaluation (Addendum)
 Anesthesia Evaluation  Patient identified by MRN, date of birth, ID band Patient unresponsive    Reviewed: Allergy & Precautions, NPO status , Patient's Chart, lab work & pertinent test results  Airway Mallampati: Intubated  TM Distance: >3 FB Neck ROM: Full    Dental  (+) Teeth Intact, Dental Advisory Given   Pulmonary COPD,  COPD inhaler, former smoker Quit smoking 2002, 17 pack year history    breath sounds clear to auscultation       Cardiovascular hypertension (83/43 preop), +CHF (grade 2 diastolic dysfunction, normal LVEF on echo today\)   Rhythm:Regular Rate:Normal  Echo today  1. Left ventricular ejection fraction, by estimation, is 55 to 60%. The  left ventricle has normal function. The left ventricle has no regional  wall motion abnormalities. Left ventricular diastolic parameters are  consistent with Grade II diastolic  dysfunction (pseudonormalization).   2. Right ventricular systolic function is low normal. The right  ventricular size is normal.   3. Left atrial size was moderately dilated.   4. 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 There is at  least moderate centrally directed mitral regurgitation and mild prolapse  of posterior mitral valve leaflets.   5. The aortic valve is normal in structure. There is mild calcification  of the aortic valve. Aortic valve regurgitation is not visualized. No  aortic stenosis is present. There is moderate calcification and thickening  of the aortic valve annulus.   6. The inferior vena cava is normal in size with greater than 50%  respiratory variability, suggesting right atrial pressure of 3 mmHg.      Neuro/Psych Seizures -, Well Controlled,  PSYCHIATRIC DISORDERS Anxiety Depression   Dementia Recent admission for subdural hematoma w/ residual R sided weakness New onset seizures- Upon presentation to [facility] she continued to have seizures despite Ativan and  Keppra.  She was ultimately transferred to [facility] yesterday for continuous EEG monitoring.  She also was given Vimpat and continued to seize. She was transferred to the ICU for phenobarbital administration and protected airway.     GI/Hepatic Neg liver ROS,GERD  Controlled,,  Endo/Other  diabetes, Well Controlled, Type 2Hypothyroidism    Renal/GU negative Renal ROS  negative genitourinary   Musculoskeletal  (+) Arthritis , Osteoarthritis,  Fibromyalgia -  Abdominal   Peds  Hematology  (+) Blood dyscrasia, anemia Hb 8.5, plt 60 Antiphospholipid ab- on eliquis     Anesthesia Other Findings Vascular consulted for a misplaced central venous catheter.  Overnight there was an attempt to place a central venous catheter into the subclavian, however this was accidentally placed into the left subclavian artery.   Reproductive/Obstetrics negative OB ROS                              Anesthesia Physical Anesthesia Plan  ASA: 3  Anesthesia Plan: General   Post-op Pain Management:    Induction: Intravenous  PONV Risk Score and Plan: 3 and Ondansetron , Dexamethasone , Midazolam  and Treatment may vary due to age or medical condition  Airway Management Planned: Oral ETT  Additional Equipment: Arterial line  Intra-op Plan:   Post-operative Plan: Post-operative intubation/ventilation  Informed Consent: I have reviewed the patients History and Physical, chart, labs and discussed the procedure including the risks, benefits and alternatives for the proposed anesthesia with the patient or authorized representative who has indicated his/her understanding and acceptance.     Dental advisory given  Plan Discussed with: CRNA  Anesthesia Plan Comments: (Access: R fem TLC, L arterial line, midline LUE, PIV x 2 Vent: PRVC TV 310, PEEP 5, R 25, FiO3 30% 7.5 ETT taped at 23cm)        Anesthesia Quick Evaluation

## 2024-03-31 NOTE — Transfer of Care (Signed)
 Immediate Anesthesia Transfer of Care Note  Patient: Alison Lopez  Procedure(s) Performed: AORTOGRAM (Left)  Patient Location: ICU  Anesthesia Type:General  Level of Consciousness: Patient remains intubated per anesthesia plan  Airway & Oxygen Therapy: Patient remains intubated per anesthesia plan and Patient placed on Ventilator (see vital sign flow sheet for setting)  Post-op Assessment: Report given to RN and Post -op Vital signs reviewed and stable  Post vital signs: Reviewed and stable  Last Vitals:  Vitals Value Taken Time  BP 119/59 03/31/24 1800  Temp    Pulse 59 03/31/24 1806  Resp 25 03/31/24 1806  SpO2 100 % 03/31/24 1806  Vitals shown include unfiled device data.  Last Pain:  Vitals:   03/31/24 1554  TempSrc: Axillary         Complications: No notable events documented.

## 2024-04-01 ENCOUNTER — Encounter (HOSPITAL_COMMUNITY): Payer: Self-pay | Admitting: Vascular Surgery

## 2024-04-01 ENCOUNTER — Inpatient Hospital Stay (HOSPITAL_COMMUNITY)

## 2024-04-01 DIAGNOSIS — R569 Unspecified convulsions: Secondary | ICD-10-CM | POA: Diagnosis not present

## 2024-04-01 DIAGNOSIS — J9601 Acute respiratory failure with hypoxia: Secondary | ICD-10-CM | POA: Diagnosis not present

## 2024-04-01 DIAGNOSIS — D6861 Antiphospholipid syndrome: Secondary | ICD-10-CM | POA: Diagnosis not present

## 2024-04-01 DIAGNOSIS — N3000 Acute cystitis without hematuria: Secondary | ICD-10-CM | POA: Diagnosis not present

## 2024-04-01 DIAGNOSIS — I6200 Nontraumatic subdural hemorrhage, unspecified: Secondary | ICD-10-CM | POA: Diagnosis not present

## 2024-04-01 DIAGNOSIS — A419 Sepsis, unspecified organism: Secondary | ICD-10-CM | POA: Diagnosis not present

## 2024-04-01 DIAGNOSIS — N390 Urinary tract infection, site not specified: Secondary | ICD-10-CM | POA: Diagnosis not present

## 2024-04-01 DIAGNOSIS — Z95828 Presence of other vascular implants and grafts: Secondary | ICD-10-CM

## 2024-04-01 LAB — BPAM PLATELET PHERESIS
Blood Product Expiration Date: 202505222359
Blood Product Expiration Date: 202505232359
ISSUE DATE / TIME: 202505220507
ISSUE DATE / TIME: 202505220507
Unit Type and Rh: 6200
Unit Type and Rh: 6200

## 2024-04-01 LAB — BASIC METABOLIC PANEL WITH GFR
Anion gap: 6 (ref 5–15)
BUN: <5 mg/dL — ABNORMAL LOW (ref 8–23)
CO2: 22 mmol/L (ref 22–32)
Calcium: 8 mg/dL — ABNORMAL LOW (ref 8.9–10.3)
Chloride: 111 mmol/L (ref 98–111)
Creatinine, Ser: 0.7 mg/dL (ref 0.44–1.00)
GFR, Estimated: >60 mL/min (ref >60)
Glucose, Bld: 121 mg/dL — ABNORMAL HIGH (ref 70–99)
Potassium: 3.9 mmol/L (ref 3.5–5.1)
Sodium: 139 mmol/L (ref 135–145)

## 2024-04-01 LAB — PREPARE PLATELET PHERESIS
Unit division: 0
Unit division: 0

## 2024-04-01 LAB — CBC WITH DIFFERENTIAL/PLATELET
Abs Immature Granulocytes: 0.06 10*3/uL (ref 0.00–0.07)
Basophils Absolute: 0.1 10*3/uL (ref 0.0–0.1)
Basophils Relative: 1 %
Eosinophils Absolute: 0.8 10*3/uL — ABNORMAL HIGH (ref 0.0–0.5)
Eosinophils Relative: 6 %
HCT: 27.4 % — ABNORMAL LOW (ref 36.0–46.0)
Hemoglobin: 8.9 g/dL — ABNORMAL LOW (ref 12.0–15.0)
Immature Granulocytes: 1 %
Lymphocytes Relative: 9 %
Lymphs Abs: 1.1 10*3/uL (ref 0.7–4.0)
MCH: 28.5 pg (ref 26.0–34.0)
MCHC: 32.5 g/dL (ref 30.0–36.0)
MCV: 87.8 fL (ref 80.0–100.0)
Monocytes Absolute: 0.7 10*3/uL (ref 0.1–1.0)
Monocytes Relative: 6 %
Neutro Abs: 9.4 10*3/uL — ABNORMAL HIGH (ref 1.7–7.7)
Neutrophils Relative %: 77 %
Platelets: 78 10*3/uL — ABNORMAL LOW (ref 150–400)
RBC: 3.12 MIL/uL — ABNORMAL LOW (ref 3.87–5.11)
RDW: 14.6 % (ref 11.5–15.5)
WBC: 12 10*3/uL — ABNORMAL HIGH (ref 4.0–10.5)
nRBC: 0 % (ref 0.0–0.2)

## 2024-04-01 LAB — GLUCOSE, CAPILLARY: Glucose-Capillary: 117 mg/dL — ABNORMAL HIGH (ref 70–99)

## 2024-04-01 LAB — PHOSPHORUS: Phosphorus: 3.3 mg/dL (ref 2.5–4.6)

## 2024-04-01 LAB — MAGNESIUM: Magnesium: 2.2 mg/dL (ref 1.7–2.4)

## 2024-04-01 LAB — TRIGLYCERIDES: Triglycerides: 60 mg/dL (ref <150)

## 2024-04-01 MED ORDER — PROPOFOL 10 MG/ML IV BOLUS
INTRAVENOUS | Status: AC
  Filled 2024-04-01: qty 20

## 2024-04-01 MED ORDER — SODIUM CHLORIDE 0.9 % IV SOLN
2.0000 g | INTRAVENOUS | Status: AC
  Administered 2024-04-01 – 2024-04-04 (×4): 2 g via INTRAVENOUS
  Filled 2024-04-01 (×4): qty 20

## 2024-04-01 MED ORDER — LORAZEPAM 2 MG/ML IJ SOLN
INTRAMUSCULAR | Status: AC
  Filled 2024-04-01: qty 1

## 2024-04-01 MED ORDER — ASPIRIN 81 MG PO CHEW
81.0000 mg | CHEWABLE_TABLET | Freq: Every day | ORAL | Status: DC
  Administered 2024-04-01: 81 mg
  Filled 2024-04-01: qty 1

## 2024-04-01 MED ORDER — IOHEXOL 350 MG/ML SOLN
75.0000 mL | Freq: Once | INTRAVENOUS | Status: AC | PRN
  Administered 2024-04-01: 75 mL via INTRAVENOUS

## 2024-04-01 MED ORDER — APIXABAN 2.5 MG PO TABS
2.5000 mg | ORAL_TABLET | Freq: Two times a day (BID) | ORAL | Status: DC
  Administered 2024-04-01 – 2024-04-10 (×19): 2.5 mg
  Filled 2024-04-01 (×20): qty 1

## 2024-04-01 MED ORDER — ASPIRIN 81 MG PO TBEC: 81.0000 mg | DELAYED_RELEASE_TABLET | Freq: Every day | ORAL | Status: DC

## 2024-04-01 NOTE — Procedures (Addendum)
 Patient Name: Alison Lopez  MRN: [id number]  Epilepsy Attending: Arleene Lack  Referring Physician/Provider: Ronnette Coke, MD  Duration: 03/31/2024 1759 to 04/01/2024 1759   Patient history: 68 y.o. female with a history of seizure who is undergoing an EEG to evaluate for seizures.   Level of alertness:  comatose   AEDs during EEG study: LEV, Perampanel, LCM, Propofol    Technical aspects: This EEG study was done with scalp electrodes positioned according to the 10-20 International system of electrode placement. Electrical activity was reviewed with band pass filter of 1-70Hz , sensitivity of 7 uV/mm, display speed of 32mm/sec with a 60Hz  notched filter applied as appropriate. EEG data were recorded continuously and digitally stored.  Video monitoring was available and reviewed as appropriate.   Description: EEG showed burst suppression pattern with generalized bursts of 3-6hz  theta-delta slowing lasting 2-5 seconds alternating with generalized suppression lasting 3-5 seconds. Sharp waves were noted in left hemisphere, maximal left centro-parietal region. Hyperventilation and photic stimulation were not performed.     Event button was pressed on 04/01/2024 at 0834 for left shoulder tremor-like movements. Concomitant EEG before, during and after the event did not show any EEG changes suggest seizure.   ABNORMALITY - Sharp waves, left centro-parietal region - Burst suppression, generalized   IMPRESSION:  This study showed evidence of epileptogenicity arising from left centro- parietal region. Additionally there was profound diffuse encephalopathy likely due to sedation. No seizures were noted.  Event button was pressed on 04/01/2024 at 0834 for left shoulder tremor-like movements without concomitant EEG change.  This was most likely not an epileptic event.   Shalee Paolo O Robel Wuertz

## 2024-04-01 NOTE — Progress Notes (Signed)
 NAME:  Alison Lopez, MRN:  [id number], DOB:  10-Jun-1956, LOS: 2 ADMISSION DATE:  03/30/2024, CONSULTATION DATE: 5/22 REFERRING MD:  Dr Gladstone Lamer, CHIEF COMPLAINT: Seizure  History of Present Illness:  Patient is encephalopathic and/or intubated; therefore, history has been obtained from chart review.  68 year old female with past medical history as below, which is significant for COPD, dementia, diabetes, DVT, antiphospholipid syndrome and DVT on Eliquis , hypertension, lupus, hypothyroidism, and recent admission for subdural hematoma in April 2025 with bur hole evacuation.  Residual right-sided weakness.  She was discharged from SNF about 1 week prior to her presentation 5/21 with chief complaint of new onset seizure and altered mental status.  Seizures were indeed witnessed in the emergency department and she was given Ativan and Keppra.  She continued to have seizures despite treatment and was given additional Ativan and Keppra.  She was also started on ceftriaxone  for UTI.  Seizures were focal in nature with shoulder and facial twitching.  CT of the head showed decreasing subdural size.  MRI MRV showed multiple scattered foci of small infarcts.  She was transferred from [facility] to [facility] for continuous EEG monitoring.  She continued to seize even despite Vimpat load and was transferred to ICU for phenobarbital ministration and concern for airway.  PCCM is consulted.   Pertinent  Medical History   has a past medical history of Anxiety, Arthritis, Collagen vascular disease (HCC), COPD (chronic obstructive pulmonary disease) (HCC), DDD (degenerative disc disease), lumbar, Dementia (HCC), Depression, Diabetes mellitus without complication (HCC), DVT (deep venous thrombosis) (HCC), Fibromyalgia, GERD (gastroesophageal reflux disease), Graves' disease with exophthalmos, HLD (hyperlipidemia), Hypertension, Hypothyroidism, Lupus, and Osteoporosis.   Significant Hospital Events: Including procedures,  antibiotic start and stop dates in addition to other pertinent events   5/22 transferred to ICU for seizure and worsening mental status, intubated for airway protection, required pressors and line placement, L subclavian placed arterially, vascular consulted and removed, subclavian stents placed   Interim History / Subjective:  Stable overnight, remains on low dose norepi Had an episode of L sided shaking, seen by neuro, low suspicion sz and remains on LTM  Objective    Blood pressure (!) 108/57, pulse 61, temperature 98.1 F (36.7 C), temperature source Axillary, resp. rate (!) 28, SpO2 99%.    Vent Mode: PRVC FiO2 (%):  [30 %] 30 % Set Rate:  [25 bmp] 25 bmp Vt Set:  [310 mL] 310 mL PEEP:  [5 cmH20] 5 cmH20 Plateau Pressure:  [9 cmH20-15 cmH20] 15 cmH20   Intake/Output Summary (Last 24 hours) at 04/01/2024 0836 Last data filed at 04/01/2024 0700 Gross per 24 hour  Intake 3409.79 ml  Output 560 ml  Net 2849.79 ml   There were no vitals filed for this visit.   General:  critically ill elderly F intubated and sedated  HEENT: MM pink/moist, ETT in place Neuro: examined on propfol, RASS -4, R pupil 5mm (baseline) L pupil 2mm sluggishly responsive to light, withdraws to pain on propofol  CV: s1s2 rrr, no m/r/g PULM:  clear bilaterally on mechanical ventilation, equal chest rise, thick white respiratory secretions suctioned  GI: soft, non-distended  Extremities: warm/dry, trace edema   Labs: Platelets 78, WBC 12  Micro/abx UC 5/19>30K E. 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 -HOB elevated 30 degrees. -Plateau pressures less than 30 cm H20.  -Follow chest x-ray, ABG prn.   -Bronchial hygiene and RT/bronchodilator protocol.   Recent subdural hematoma: Status post bur hole evacuation in March.  Size reduced on CT now Multifocal bilateral small strokes: Some of which may be seizure related change - Okay to continue Eliquis  per neuro,  - Echocardiogram pending  Sepsis secondary to acute cystitis: Concern for aspiration  Urine culture from 5/20 grew showing 30K colonies of E. coli - Continue Rocephin  until 5/27  Antiphospholipid syndrome  Chronic Thrombocytopenia  with history of DVT on chronic Eliquis  - platelets stable and above baseline, consider resuming Eliquis  if repeat head  CT is stable   Hypokalemia Hypomagnesemia -trend electrolytes and replete prn   Hypothyroid - Synthroid   Malpositioned L subclavian CVC -removed by vascular surgery 5/22 -recommend Asa 81mg  given stent placement, platelets are stable and above baseline, will initiate today  Best Practice (right click and "Reselect all SmartList Selections" daily)   Diet/type: tubefeeds DVT prophylaxis SCD Pressure ulcer(s): pressure ulcer assessment deferred  GI prophylaxis: N/A Lines: Central line Foley:  N/A Code Status:  full code Last date of multidisciplinary goals of care discussion [family updated at the bedside 5/22]  Labs   CBC: Recent Labs  Lab 03/28/24 2133 03/30/24 0545 03/31/24 0112 03/31/24 0536 03/31/24 0549 03/31/24 0919 04/01/24 0452  WBC 7.6 8.9 18.0* 9.9  --   --  12.0*  NEUTROABS 5.8  --  14.7*  --   --   --  9.4*  HGB 11.4* 10.2* 9.4* 10.2* 9.5* 8.5* 8.9*  HCT 33.5* 31.1* 28.0* 31.5* 28.0* 25.0* 27.4*  MCV 83.8 84.7 84.8 86.5  --   --  87.8  PLT 59* 54* 97* 60*  --   --  78*    Basic  Metabolic Panel: Recent Labs  Lab 03/28/24 2133 03/29/24 0738 03/30/24 0544 03/30/24 0545 03/30/24 1803 03/31/24 0536 03/31/24 0549 03/31/24 0919 04/01/24 0452  NA 135  --   --  145 144 141 145 144 139  K 2.8*  --   --  2.9* 3.0* 3.5 3.3* 3.1* 3.9  CL 104  --   --  115* 115* 116*  --   --  111  CO2 22  --   --  22 21* 19*  --   --  22  GLUCOSE 112*  --   --  129* 109* 113*  --   --  121*  BUN 15  --   --  <5* <5* <5*  --   --  <5*  CREATININE 0.69  --   --  0.62 0.80 0.72  --   --  0.70  CALCIUM  8.4*  --   --  7.8* 8.2* 8.0*  --   --  8.0*  MG 1.8 1.5* 1.7  --  1.6*  --   --   --  2.2  PHOS 3.5  --  2.6  --   --   --   --   --  3.3   GFR: Estimated Creatinine Clearance: 43.8 mL/min (by C-G formula based on SCr of 0.7 mg/dL). Recent Labs  Lab  03/28/24 2133 03/29/24 0110 03/30/24 0545 03/31/24 0112 03/31/24 0536 04/01/24 0452  WBC 7.6  --  8.9 18.0* 9.9 12.0*  LATICACIDVEN 1.3 1.3  --   --   --   --     Liver Function Tests: Recent Labs  Lab 03/28/24 2133 03/31/24 0536  AST 19 26  ALT 10 14  ALKPHOS 54 52  BILITOT 0.7 0.5  PROT 6.2* 5.0*  ALBUMIN 3.0* 2.3*   No results for input(s): "LIPASE", "AMYLASE" in the last 168 hours. No results for input(s): "AMMONIA" in the last 168 hours.  ABG    Component Value Date/Time   PHART 7.357 03/31/2024 0919   PCO2ART 35.8 03/31/2024 0919   PO2ART 138 (H) 03/31/2024 0919   HCO3 20.2 03/31/2024 0919   TCO2 21 (L) 03/31/2024 0919   ACIDBASEDEF 5.0 (H) 03/31/2024 0919   O2SAT 99 03/31/2024 0919     Coagulation Profile: Recent Labs  Lab 03/28/24 2133  INR 1.3*    Cardiac Enzymes: Recent Labs  Lab 03/29/24 0738  CKTOTAL 49    HbA1C: Hgb A1c MFr Bld  Date/Time Value Ref Range Status  03/31/2024 08:32 AM 4.5 (L) 4.8 - 5.6 % Final    Comment:    (NOTE) Pre diabetes:          5.7%-6.4%  Diabetes:              >6.4%  Glycemic control for   <7.0% adults with diabetes   01/30/2024 03:11 AM 4.8 4.8 - 5.6  % Final    Comment:    (NOTE) Pre diabetes:          5.7%-6.4%  Diabetes:              >6.4%  Glycemic control for   <7.0% adults with diabetes     CBG: Recent Labs  Lab 03/29/24 0427 03/29/24 1651 03/30/24 1717 03/31/24 0303 04/01/24 0341  GLUCAP 77 137* 91 81 117*    Review of Systems:   Patient is encephalopathic and/or intubated; therefore, history has been obtained from chart review.    Past Medical History:  She,  has a past medical history of Anxiety, Arthritis, Collagen vascular disease (HCC), COPD (chronic obstructive pulmonary disease) (HCC), DDD (degenerative disc disease), lumbar, Dementia (HCC), Depression, Diabetes mellitus without complication (HCC), DVT (deep venous thrombosis) (HCC), Fibromyalgia, GERD (gastroesophageal reflux disease), Graves' disease with exophthalmos, HLD (hyperlipidemia), Hypertension, Hypothyroidism, Lupus, and Osteoporosis.   Surgical History:   Past Surgical History:  Procedure Laterality Date   ABDOMINAL HYSTERECTOMY     BURR HOLE Bilateral 01/30/2024   Procedure: CREATION, CRANIAL BURR HOLE, SUBDURAL HEMATOMA EVACUATIOIN;  Surgeon: Jodeen Munch, MD;  Location: [facility] ORS;  Service: Neurosurgery;  Laterality: Bilateral;  need ventriculostomy x 2   COLONOSCOPY WITH PROPOFOL  N/A 06/11/2021   Procedure: COLONOSCOPY WITH PROPOFOL ;  Surgeon: Shane Darling, MD;  Location: [facility] ENDOSCOPY;  Service: Endoscopy;  Laterality: N/A;  prefers afternoon Eliquis    COLONOSCOPY WITH PROPOFOL  N/A 12/19/2022   Procedure: COLONOSCOPY WITH PROPOFOL ;  Surgeon: Shane Darling, MD;  Location: [facility] ENDOSCOPY;  Service: Endoscopy;  Laterality: N/A;   ESOPHAGOGASTRODUODENOSCOPY N/A 06/11/2021   Procedure: ESOPHAGOGASTRODUODENOSCOPY (EGD);  Surgeon: Shane Darling, MD;  Location: [facility] ENDOSCOPY;  Service: Endoscopy;  Laterality: N/A;   EYE SURGERY Right    IVC FILTER REMOVAL N/A 07/21/2019   Procedure: IVC FILTER REMOVAL;  Surgeon: Celso College,  MD;  Location: [facility] INVASIVE CV LAB;  Service: Cardiovascular;  Laterality: N/A;  LOWER EXTREMITY INTERVENTION Right 04/22/2019   Procedure: IVC Filter Insertion with Right Lower Extremity Venous Lysis;  Surgeon: Celso College, MD;  Location: [facility] INVASIVE CV LAB;  Service: Cardiovascular;  Laterality: Right;   OOPHORECTOMY       Social History:   reports that she quit smoking about 23 years ago. Her smoking use included cigarettes. She started smoking about 40 years ago. She has a 17 pack-year smoking history. She has never used smokeless tobacco. She reports that she does not currently use alcohol. She reports that she does not use drugs.   Family History:  Her family history includes Heart attack in her father; Stomach cancer in her mother. There is no history of Breast cancer.   Allergies Allergies  Allergen Reactions   Morphine And Codeine Anaphylaxis   Montelukast  Other (See Comments)    Nightmares   Antihistamines, Diphenhydramine -Type Nausea Only   Aspirin Other (See Comments)    Patient on blood thinners and was told not to take   Benadryl  [Diphenhydramine  Hcl] Hives   Codeine Other (See Comments)    AMS     Home Medications  Prior to Admission medications   Medication Sig Start Date End Date Taking? Authorizing Provider  acetaminophen  (TYLENOL ) 325 MG tablet Take 2 tablets (650 mg total) by mouth every 6 (six) hours as needed for mild pain (pain score 1-3) (or Fever >/= 101). 03/03/24   Angiulli, Everlyn Hockey, PA-C  apixaban  (ELIQUIS ) 2.5 MG TABS tablet Take 1 tablet (2.5 mg total) by mouth 2 (two) times daily. 03/14/24   [name], MD  ascorbic acid  (VITAMIN C ) 1000 MG tablet Take 1 tablet (1,000 mg total) by mouth daily. 03/03/24   Angiulli, Everlyn Hockey, PA-C  cefTRIAXone  1 g in sodium chloride  0.9 % 100 mL Inject 1 g into the vein daily. 03/30/24   Sheril Dines, MD  cyanocobalamin  (VITAMIN B12) 1000 MCG tablet Take 1 tablet (1,000 mcg total) by mouth daily. 03/14/24   [name], MD   donepezil  (ARICEPT ) 10 MG tablet Take 1 tablet (10 mg total) by mouth at bedtime. 03/03/24   Angiulli, Everlyn Hockey, PA-C  ergocalciferol  (VITAMIN D2) 1.25 MG (50000 UT) capsule Take 1 capsule (50,000 Units total) by mouth every Friday. 03/04/24   Angiulli, Everlyn Hockey, PA-C  levETIRAcetam (KEPPRA) 500 MG/5ML undiluted injection Inject 15 mLs (1,500 mg total) into the vein every 12 (twelve) hours. 03/30/24   Sheril Dines, MD  levothyroxine  (SYNTHROID ) 125 MCG tablet Take 1 tablet (125 mcg total) by mouth daily before breakfast. 03/03/24   Angiulli, Everlyn Hockey, PA-C  melatonin 5 MG TABS Take 1 tablet (5 mg total) by mouth at bedtime. 03/03/24   Angiulli, Everlyn Hockey, PA-C  methylphenidate  (RITALIN ) 10 MG tablet Take 1 tablet (10 mg total) by mouth 2 (two) times daily with breakfast and lunch. 03/15/24   Jodi Munroe, NP  rosuvastatin  (CRESTOR ) 20 MG tablet Take 1 tablet (20 mg total) by mouth daily. 03/03/24   Angiulli, Everlyn Hockey, PA-C  sertraline  (ZOLOFT ) 25 MG tablet Take 1 tablet (25 mg total) by mouth daily. 03/03/24   Angiulli, Everlyn Hockey, PA-C     Critical care time: additional 40 mins critical care time       CRITICAL CARE Performed by: Patt Boozer Estanislao Harmon   Total critical care time: 40 minutes  Critical care time was exclusive of separately billable procedures and treating other patients.  Critical care was necessary to treat or prevent imminent or life-threatening deterioration.  Critical care was time spent personally by me on the following activities: development of treatment plan with patient and/or surrogate as well as nursing, discussions with consultants, evaluation of patient's response to treatment, examination of patient, obtaining history from patient or surrogate, ordering and performing treatments and interventions, ordering and review of laboratory studies, ordering and review of radiographic studies, pulse oximetry and re-evaluation of patient's condition.     Patt Boozer Santosh Petter,  PA-C  Pulmonary & Critical care See Amion for pager If no response to pager , please call [phone] until 7pm After 7:00 pm call Elink  [phone]

## 2024-04-01 NOTE — Progress Notes (Signed)
 NEUROLOGY CONSULT FOLLOW UP NOTE   Date of service: Apr 01, 2024 Patient Name: Alison Lopez MRN:  [id number] DOB:  1956/09/30  HPI / course 68 yo patient with hx dementia, antiphospholipid syndrome on low dose Eliquis  now due to recent spontaneous SDH in March s/p burr hole evacuation who presented 5/19 evening for complex partial seizures to [facility]. Head CT showed further decrease size of the previously seen mixed density left subdural hematoma, now 3 mm in thickness compared to 7 mm previously.    She was loaded on Keppra 20 mg/kg on admission and started on 500 mg twice daily.  She has had 2 complex partial seizures 5/01 both aborted with Ativan, for which she was given 2 g additional keppra and increased to 750 mg to be started this evening and then continued every 12 hours after that, esclated to max dose for renal function of 1000 mg BID on 5/21 evening   She was on Eliquis  5 mg twice daily when she had the subdural in March.  She has been restarted now as an outpatient on Eliquis  2.5 mg twice daily to reduce the risk of arterial and venous thrombosis.  This was held on admission due to thrombocytopenia with platelets of 59, but restarted by primary team on 5/21 evening.    MRI brain / MRV obtained to rule out acute infarct and/or venous sinus thrombosis contributing -- no CVST, but positive for small stroke [vs. Seizure related changes]    She is also on ceftriaxone  for UTI.    Continued to have declining mental status and focal seizures, 5/21 to 5/22 overnight and loaded with Vimpat, Phenobarb, intubated and started on Propofol  and Depakote  Interval Hx/subjective  Continued to have declining mental status and focal seizures, 5/21 to 5/22 overnight and loaded with Vimpat, Phenobarb, intubated and started on Propofol  and Depakote. - This AM, bedside team concerned for patient with left upper extremity tremor and posturing movements, left lower extremity tremor.  Neurology at bedside with  evaluation of responsive tremor, felt less likely seizure activity.  Requested to hold ativan at this time.  Epileptologist review without active seizure.   Vitals   Vitals:   04/01/24 0715 04/01/24 0730 04/01/24 0745 04/01/24 0801  BP: (!) 112/51 (!) 116/58 (!) 108/57   Pulse: 60 (!) 59 61   Resp: (!) 25 (!) 29 (!) 28   Temp:    98.1 F (36.7 C)  TempSrc:    Axillary  SpO2: 98% 98% 99%     There is no height or weight on file to calculate BMI.  Physical Exam   Constitutional: Appears frail and chronically ill, intubated and sedated in the ICU Psych: Not interactive Eyes: Mild scleral edema right > left HENT: ETT in place, secured with securement device Head: Normocephalic. cEEG remains in place Cardiovascular: Normal rate and regular rhythm.  Respiratory: breathing over the vent  Neuro:  Mental Status: Does not open eyes spontaneously, to voice, or noxious stimulation Does not follow any commands Does not attempt to verbalize Cranial Nerves: II: No blink to threat. Right pupil > left, right pupil is slightly irregular, left pupil round, both sluggishly reactive to light III,IV, VI/VIII: EOMI absent to VOR, dysconjugate midline gaze with right pupil slightly abducted and upward V/VII: No corneal reflex appreciated VIII: No response to voice X/XI: No cough/gag XII: Does not protrude tongue to command 2/2 patient mental status  Motor/Sensory: Slight withdraw in all four extremities to light noxious stim, grossly equal  Cerebellar: Unable to assess secondary to patient's mental status   No right sided twitching at this time. Left upper and lower extremity tremor appreciated, responsive to touch -- diminished with examiner touch.    Medications  Current Facility-Administered Medications:    0.9 %  sodium chloride  infusion (Manually program via Guardrails IV Fluids), , Intravenous, Once, Schuh, McKenzi P, PA-C, Held at 03/31/24 4098   acetaminophen  (TYLENOL ) 160 MG/5ML  solution 650 mg, 650 mg, Per Tube, Q6H PRN **OR** acetaminophen  (TYLENOL ) suppository 650 mg, 650 mg, Rectal, Q6H PRN, Schuh, McKenzi P, PA-C   cefTRIAXone  (ROCEPHIN ) 2 g in sodium chloride  0.9 % 100 mL IVPB, 2 g, Intravenous, Q24H, Schuh, McKenzi P, PA-C, Stopped at 03/31/24 2233   Chlorhexidine  Gluconate Cloth 2 % PADS 6 each, 6 each, Topical, Daily, Schuh, McKenzi P, PA-C, 6 each at 04/01/24 0421   docusate (COLACE) 50 MG/5ML liquid 100 mg, 100 mg, Per Tube, BID, Schuh, McKenzi P, PA-C, 100 mg at 03/31/24 2149   famotidine  (PEPCID ) tablet 20 mg, 20 mg, Per Tube, BID, Schuh, McKenzi P, PA-C, 20 mg at 03/31/24 2149   fentaNYL  (SUBLIMAZE ) injection 25-100 mcg, 25-100 mcg, Intravenous, Q30 min PRN, Schuh, McKenzi P, PA-C, 50 mcg at 03/31/24 1191   fentaNYL  in NS (10mcg/ml) infusion-PREMIX, 0-400 mcg/hr, Intravenous, Continuous, Schuh, McKenzi P, PA-C, Held at 03/31/24 4782   lacosamide (VIMPAT) 100 mg in sodium chloride  0.9 % 25 mL IVPB, 100 mg, Intravenous, BID, Schuh, McKenzi P, PA-C, Stopped at 03/31/24 2306   lactated ringers  infusion, , Intravenous, Continuous, Gleason, Patt Boozer, PA-C, Last Rate: 100 mL/hr at 04/01/24 0700, Infusion Verify at 04/01/24 0700   levETIRAcetam (KEPPRA) undiluted injection 1,000 mg, 1,000 mg, Intravenous, BID, Schuh, McKenzi P, PA-C, 1,000 mg at 03/31/24 2149   levothyroxine  (SYNTHROID ) tablet 125 mcg, 125 mcg, Per Tube, Q0600, Schuh, McKenzi P, PA-C, 125 mcg at 04/01/24 0550   LORazepam (ATIVAN) 2 MG/ML injection, , , ,    midazolam  (VERSED ) injection 1-2 mg, 1-2 mg, Intravenous, Q1H PRN, Schuh, McKenzi P, PA-C   norepinephrine (LEVOPHED) 4mg  in (0.016 mg/mL) premix infusion, 0-40 mcg/min, Intravenous, Titrated, Schuh, McKenzi P, PA-C, Last Rate: 30 mL/hr at 04/01/24 0700, 8 mcg/min at 04/01/24 0700   Oral care mouth rinse, 15 mL, Mouth Rinse, Q2H, Schuh, McKenzi P, PA-C, 15 mL at 04/01/24 0556   Oral care mouth rinse, 15 mL, Mouth Rinse, PRN, Schuh,  McKenzi P, PA-C   perampanel (FYCOMPA) tablet 8 mg, 8 mg, Oral, Once, Schuh, McKenzi P, PA-C   PHENYLephrine  80 mcg/ml in normal saline Adult IV Push Syringe (For Blood Pressure Support), 80-200 mcg, Intravenous, Once PRN, Schuh, McKenzi P, PA-C   polyethylene glycol (MIRALAX  / GLYCOLAX ) packet 17 g, 17 g, Per Tube, Daily, Schuh, McKenzi P, PA-C   propofol  (DIPRIVAN ) 1000 MG/100ML infusion, 0-80 mcg/kg/min, Intravenous, Titrated, Schuh, McKenzi P, PA-C, Last Rate: 11.21 mL/hr at 04/01/24 0700, 40 mcg/kg/min at 04/01/24 0700   rosuvastatin  (CRESTOR ) tablet 20 mg, 20 mg, Per Tube, QHS, Schuh, McKenzi P, PA-C, 20 mg at 03/31/24 2149   sodium chloride  flush (NS) 0.9 % injection 10-40 mL, 10-40 mL, Intracatheter, Q12H, Schuh, McKenzi P, PA-C, 30 mL at 03/31/24 2152   sodium chloride  flush (NS) 0.9 % injection 10-40 mL, 10-40 mL, Intracatheter, PRN, Schuh, McKenzi P, PA-C   sodium chloride  flush (NS) 0.9 % injection 3 mL, 3 mL, Intravenous, Q12H, Schuh, McKenzi P, PA-C, 3 mL at 03/31/24 2152  Labs and Diagnostic Imaging  Basic Metabolic Panel: Recent Labs  Lab 03/28/24 2133 03/29/24 0738 03/30/24 0544 03/30/24 0545 03/30/24 1803 03/31/24 0536 03/31/24 0549 03/31/24 0919 04/01/24 0452  NA 135  --   --  145 144 141 145 144 139  K 2.8*  --   --  2.9* 3.0* 3.5 3.3* 3.1* 3.9  CL 104  --   --  115* 115* 116*  --   --  111  CO2 22  --   --  22 21* 19*  --   --  22  GLUCOSE 112*  --   --  129* 109* 113*  --   --  121*  BUN 15  --   --  <5* <5* <5*  --   --  <5*  CREATININE 0.69  --   --  0.62 0.80 0.72  --   --  0.70  CALCIUM  8.4*  --   --  7.8* 8.2* 8.0*  --   --  8.0*  MG 1.8 1.5* 1.7  --  1.6*  --   --   --  2.2  PHOS 3.5  --  2.6  --   --   --   --   --  3.3   CBC: Recent Labs  Lab 03/28/24 2133 03/30/24 0545 03/31/24 0112 03/31/24 0536 03/31/24 0549 03/31/24 0919 04/01/24 0452  WBC 7.6 8.9 18.0* 9.9  --   --  12.0*  NEUTROABS 5.8  --  14.7*  --   --   --  9.4*  HGB 11.4* 10.2*  9.4* 10.2* 9.5* 8.5* 8.9*  HCT 33.5* 31.1* 28.0* 31.5* 28.0* 25.0* 27.4*  MCV 83.8 84.7 84.8 86.5  --   --  87.8  PLT 59* 54* 97* 60*  --   --  78*   Coagulation Studies: No results for input(s): "LABPROT", "INR" in the last 72 hours.   Lipid Panel: Lab Results  Component Value Date   CHOL 86 03/31/2024   HDL 26 (L) 03/31/2024   LDLCALC 43 03/31/2024   TRIG 60 04/01/2024   CHOLHDL 3.3 03/31/2024   HgbA1c:  Lab Results  Component Value Date   HGBA1C 4.5 (L) 03/31/2024   Urine Drug Screen:     Component Value Date/Time   LABOPIA NONE DETECTED 03/29/2024 0110   COCAINSCRNUR NONE DETECTED 03/29/2024 0110   LABBENZ POSITIVE (A) 03/29/2024 0110   AMPHETMU NONE DETECTED 03/29/2024 0110   THCU NONE DETECTED 03/29/2024 0110   LABBARB NONE DETECTED 03/29/2024 0110    Alcohol Level     Component Value Date/Time   ETH <10 01/29/2024 1307   INR  Lab Results  Component Value Date   INR 1.3 (H) 03/28/2024   APTT  Lab Results  Component Value Date   APTT 38 (H) 03/31/2024   AED levels: No results found for: "PHENYTOIN", "ZONISAMIDE", "LAMOTRIGINE", "LEVETIRACETA"  5/19 CT Head without contrast(Personally reviewed): Further decreased size of the previously seen mixed density left subdural hematoma, now 3 mm in thickness compared to 7 mm previously. 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 Atrophy, chronic microvascular disease. No acute intracranial abnormality.   5/21 MRI Brain(Personally reviewed): Multiple scattered foci of infarct involving the cortex and subcortical white matter in the bilateral occipital lobes, parietal lobes, and left temporal occipital lobes. Infarcts likely of varying ages some of which appear subacute. Redemonstrated subdural collection over the left cerebral convexity measuring 3 mm in thickness. Dural thickening and enhancement over the left cerebral convexities likely reactive. No evidence of venous sinus  thrombosis.  No significant  stenosis. Moderate chronic microvascular ischemic changes. Multiple remote infarcts as above.  5/20 rEEG This EEG was obtained while awake and asleep and is abnormal due to mild-to-moderate diffuse slowing indicative of global cerebral dysfunction. Epileptiform abnormalities were not seen during this recording.   cEEG 03/30/2024 1759 to 03/31/2024 1759:  - Focal convulsive status epilepticus, left centro-parietal region - Burst suppression, generalized IMPRESSION:  At the beginning of the study, patient had near continuous twitching of right shoulder. 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 Patient was intubated and started on propofol . Subsequently, status epilepticus resolved and was suggestive of profound diffuse encephalopathy likely due to sedation.  cEEG overnight 03/31/24 - 04/01/24  This study showed evidence of epileptogenicity arising from left centro- parietal region. Additionally there was profound diffuse encephalopathy likely due to sedation. No seizures were noted. Event button was pressed on 04/01/2024 at 0834 for left shoulder tremor-like movements without concomitant EEG change.  This was most likely not an epileptic event.  Assessment   Alison Lopez is a 68 y.o. female presenting w/ focal status in the setting of UTI.  Left sided tremor today non-epileptic be EEG and exam  Recommendations   # Focal status w/ complex partial seizures, now in suppression - Continue LTM EEG - Keppra 1000 mg BID is max for her renal function; continue to monitor and increase if able - Vimpat 200 mg IV at 2256, now on 100 mg BID - Depakote 1000 mg at 0015 5/22; will try to avoid standing doses of this due to the potential effect on platelets - Phenobarb 20 mg/kg at 0348 5/22 - Now on Propofol  and fentanyl  for sedation, please transition to Precedex from propofol  if medical conditions allow - Would next add Fycompa if seizures  recur - Appreciate vent management and management of other co morbidities per primary  # antiphospholipid antibody syndrome # SDH, stable # Arterial central line s/p repair - s/p 2 u platelets for thrombocytopenia - From neurology perspective, strokes are small and at least some of these may be seizure related changes, okay to continue low dose eliquis  when okay from medical perspective; depending on clinical course may repeat Head CT prior to initiation  - Appreciate vascular surgery repair on 5/22  # UTI - appreciate antibiotics per CCM  # small strokes vs. at least some seizure related changes Likely related to her antiphospholipid antibody syndrome off of anticoagulation due to her spontaneous SDH.  - HgbA1c, fasting lipid panel - Continue home Crestor  20 mg nightly - CTA or MRA of the brain without and MRA neck w/wo to be ordered when more medically stable - Echocardiogram pending - Antiplatelets and anticoagulation being held pending medical stability - Telemetry monitoring - Blood pressure goal   - Normotension as out of the window for permissive hypertension - PT consult, OT consult, Speech consult, to be ordered pending medical stability ______________________________________________________________________  Sherrod Dolphin, AGACNP-BC [facility] Pager: [phone]  Attending Neurologist's note:  I personally saw this patient, gathering history, performing a full neurologic examination, reviewing relevant labs, personally reviewing relevant imaging including EEG, and formulated the assessment and plan, adding the note above for completeness and clarity to accurately reflect my thoughts  Discussed with Dr. Baldwin Levee via secure chat  CRITICAL CARE Performed by: Ronnette Coke   Total critical care time: 35 minutes  Critical care time was exclusive of separately billable procedures and treating other patients.  Critical  care was necessary to treat or  prevent imminent or life-threatening deterioration.  Critical care was time spent personally by me on the following activities: development of treatment plan with patient and/or surrogate as well as nursing, discussions with consultants, evaluation of patient's response to treatment, examination of patient, obtaining history from patient or surrogate, ordering and performing treatments and interventions, ordering and review of laboratory studies, ordering and review of radiographic studies, pulse oximetry and re-evaluation of patient's condition.

## 2024-04-01 NOTE — Progress Notes (Addendum)
  Progress Note    04/01/2024 9:30 AM 1 Day Post-Op  Subjective:  sedated    Vitals:   04/01/24 0745 04/01/24 0801  BP: (!) 108/57   Pulse: 61   Resp: (!) 28   Temp:  98.1 F (36.7 C)  SpO2: 99%     Physical Exam: General:  sedated Lungs:  intubated Incisions:  right groin cath site without bleeding or hematoma Extremities:  palpable radial pulses bilaterally. Intact PT doppler signals bilaterally  CBC    Component Value Date/Time   WBC 12.0 (H) 04/01/2024 0452   RBC 3.12 (L) 04/01/2024 0452   HGB 8.9 (L) 04/01/2024 0452   HGB 12.5 03/14/2024 1014   HCT 27.4 (L) 04/01/2024 0452   PLT 78 (L) 04/01/2024 0452   PLT 123 (L) 03/14/2024 1014   MCV 87.8 04/01/2024 0452   MCH 28.5 04/01/2024 0452   MCHC 32.5 04/01/2024 0452   RDW 14.6 04/01/2024 0452   LYMPHSABS 1.1 04/01/2024 0452   MONOABS 0.7 04/01/2024 0452   EOSABS 0.8 (H) 04/01/2024 0452   BASOSABS 0.1 04/01/2024 0452    BMET    Component Value Date/Time   NA 139 04/01/2024 0452   K 3.9 04/01/2024 0452   CL 111 04/01/2024 0452   CO2 22 04/01/2024 0452   GLUCOSE 121 (H) 04/01/2024 0452   BUN <5 (L) 04/01/2024 0452   CREATININE 0.70 04/01/2024 0452   CREATININE 0.91 03/14/2024 1014   CALCIUM  8.0 (L) 04/01/2024 0452   GFRNONAA >60 04/01/2024 0452   GFRNONAA >60 03/14/2024 1014   GFRAA >60 08/03/2020 1013    INR    Component Value Date/Time   INR 1.3 (H) 03/28/2024 2133     Intake/Output Summary (Last 24 hours) at 04/01/2024 0930 Last data filed at 04/01/2024 0700 Gross per 24 hour  Intake 3409.79 ml  Output 560 ml  Net 2849.79 ml      Assessment/Plan:  68 y.o. female is 1 day post op, s/p: removal of left subclavian arterial line catheter and angioplasty and stenting of the left subclavian artery   -She remains intubated and sedated  -Right groin mynx closure site is intact without hematoma -BLE well perfused with brisk PT doppler signals -Palpable radial pulses bilaterally -Hgb stable  at 8.9 -Given that the patient required subclavian stenting, she would benefit from aspirin 81mg  daily for stent patency. This can be started when her condition and platelet count stabilizes   Deneise Finlay, PA-C [facility] [phone] 04/01/2024 9:30 AM  I have seen and evaluated the patient. I agree with the PA note as documented above.  Status post left subclavian stenting from transfemoral approach for central line placed in the left subclavian artery.  Left radial pulse palpable.  Right groin looks good.  Brisk DP signal in the right foot.  Aspirin when able.  Young Hensen, MD [facility] [place] Office: [phone]

## 2024-04-01 NOTE — Progress Notes (Signed)
 Pt transported from 4N21 to CT and back without complications.

## 2024-04-01 NOTE — Anesthesia Postprocedure Evaluation (Signed)
 Anesthesia Post Note  Patient: Alison Lopez  Procedure(s) Performed: AORTOGRAM (Right: Groin) ULTRASOUND GUIDANCE, FOR VASCULAR ACCESS (Right: Groin)     Patient location during evaluation: PACU Anesthesia Type: General Level of consciousness: awake and alert Pain management: pain level controlled Vital Signs Assessment: post-procedure vital signs reviewed and stable Respiratory status: spontaneous breathing, nonlabored ventilation, respiratory function stable and patient connected to nasal cannula oxygen Cardiovascular status: blood pressure returned to baseline and stable Postop Assessment: no apparent nausea or vomiting Anesthetic complications: no   No notable events documented.        Leslye Rast

## 2024-04-01 NOTE — Progress Notes (Signed)
 Pt transported from 4N21 to CT01 and back by RN and RT w/o complications

## 2024-04-02 ENCOUNTER — Inpatient Hospital Stay (HOSPITAL_COMMUNITY)

## 2024-04-02 DIAGNOSIS — N3000 Acute cystitis without hematuria: Secondary | ICD-10-CM | POA: Diagnosis not present

## 2024-04-02 DIAGNOSIS — D6861 Antiphospholipid syndrome: Secondary | ICD-10-CM | POA: Diagnosis not present

## 2024-04-02 DIAGNOSIS — I6200 Nontraumatic subdural hemorrhage, unspecified: Secondary | ICD-10-CM | POA: Diagnosis not present

## 2024-04-02 DIAGNOSIS — R569 Unspecified convulsions: Secondary | ICD-10-CM | POA: Diagnosis not present

## 2024-04-02 DIAGNOSIS — N390 Urinary tract infection, site not specified: Secondary | ICD-10-CM | POA: Diagnosis not present

## 2024-04-02 DIAGNOSIS — J9601 Acute respiratory failure with hypoxia: Secondary | ICD-10-CM | POA: Diagnosis not present

## 2024-04-02 DIAGNOSIS — A419 Sepsis, unspecified organism: Secondary | ICD-10-CM | POA: Diagnosis not present

## 2024-04-02 LAB — CBC WITH DIFFERENTIAL/PLATELET
Abs Immature Granulocytes: 0.03 10*3/uL (ref 0.00–0.07)
Basophils Absolute: 0.1 10*3/uL (ref 0.0–0.1)
Basophils Relative: 1 %
Eosinophils Absolute: 0.4 10*3/uL (ref 0.0–0.5)
Eosinophils Relative: 4 %
HCT: 26.3 % — ABNORMAL LOW (ref 36.0–46.0)
Hemoglobin: 8.5 g/dL — ABNORMAL LOW (ref 12.0–15.0)
Immature Granulocytes: 0 %
Lymphocytes Relative: 11 %
Lymphs Abs: 1.1 10*3/uL (ref 0.7–4.0)
MCH: 28.2 pg (ref 26.0–34.0)
MCHC: 32.3 g/dL (ref 30.0–36.0)
MCV: 87.4 fL (ref 80.0–100.0)
Monocytes Absolute: 0.7 10*3/uL (ref 0.1–1.0)
Monocytes Relative: 7 %
Neutro Abs: 7.8 10*3/uL — ABNORMAL HIGH (ref 1.7–7.7)
Neutrophils Relative %: 77 %
Platelets: 67 10*3/uL — ABNORMAL LOW (ref 150–400)
RBC: 3.01 MIL/uL — ABNORMAL LOW (ref 3.87–5.11)
RDW: 14.8 % (ref 11.5–15.5)
WBC: 10.2 10*3/uL (ref 4.0–10.5)
nRBC: 0 % (ref 0.0–0.2)

## 2024-04-02 LAB — BASIC METABOLIC PANEL WITH GFR
Anion gap: 13 (ref 5–15)
BUN: 7 mg/dL — ABNORMAL LOW (ref 8–23)
CO2: 23 mmol/L (ref 22–32)
Calcium: 8.2 mg/dL — ABNORMAL LOW (ref 8.9–10.3)
Chloride: 107 mmol/L (ref 98–111)
Creatinine, Ser: 0.71 mg/dL (ref 0.44–1.00)
GFR, Estimated: >60 mL/min (ref >60)
Glucose, Bld: 102 mg/dL — ABNORMAL HIGH (ref 70–99)
Potassium: 3.6 mmol/L (ref 3.5–5.1)
Sodium: 143 mmol/L (ref 135–145)

## 2024-04-02 LAB — MAGNESIUM
Magnesium: 1.9 mg/dL (ref 1.7–2.4)
Magnesium: 1.8 mg/dL (ref 1.7–2.4)
Magnesium: 2.5 mg/dL — ABNORMAL HIGH (ref 1.7–2.4)

## 2024-04-02 LAB — CULTURE, BLOOD (ROUTINE X 2): Culture: NO GROWTH

## 2024-04-02 LAB — GLUCOSE, CAPILLARY
Glucose-Capillary: 123 mg/dL — ABNORMAL HIGH (ref 70–99)
Glucose-Capillary: 113 mg/dL — ABNORMAL HIGH (ref 70–99)
Glucose-Capillary: 109 mg/dL — ABNORMAL HIGH (ref 70–99)

## 2024-04-02 LAB — PHOSPHORUS
Phosphorus: 3.3 mg/dL (ref 2.5–4.6)
Phosphorus: 3.1 mg/dL (ref 2.5–4.6)

## 2024-04-02 MED ORDER — LEVETIRACETAM (KEPPRA) 500 MG/5 ML ADULT IV PUSH
750.0000 mg | Freq: Two times a day (BID) | INTRAVENOUS | Status: DC
  Administered 2024-04-02 – 2024-04-05 (×6): 750 mg via INTRAVENOUS
  Filled 2024-04-02 (×6): qty 10

## 2024-04-02 MED ORDER — VITAL HIGH PROTEIN PO LIQD
1000.0000 mL | ORAL | Status: DC
  Administered 2024-04-02 – 2024-04-03 (×2): 1000 mL

## 2024-04-02 MED ORDER — PROSOURCE TF20 ENFIT COMPATIBL EN LIQD
60.0000 mL | Freq: Every day | ENTERAL | Status: DC
  Administered 2024-04-02 – 2024-04-10 (×9): 60 mL
  Filled 2024-04-02 (×9): qty 60

## 2024-04-02 MED ORDER — ASPIRIN 81 MG PO CHEW
81.0000 mg | CHEWABLE_TABLET | Freq: Every day | ORAL | Status: DC
  Administered 2024-04-02 – 2024-04-10 (×9): 81 mg
  Filled 2024-04-02 (×9): qty 1

## 2024-04-02 MED ORDER — MAGNESIUM SULFATE 2 GM/50ML IV SOLN
2.0000 g | Freq: Once | INTRAVENOUS | Status: AC
  Administered 2024-04-02: 2 g via INTRAVENOUS
  Filled 2024-04-02: qty 50

## 2024-04-02 MED ORDER — POTASSIUM CHLORIDE 20 MEQ PO PACK
40.0000 meq | PACK | Freq: Once | ORAL | Status: AC
  Administered 2024-04-02: 40 meq
  Filled 2024-04-02: qty 2

## 2024-04-02 MED ORDER — STROKE: EARLY STAGES OF RECOVERY BOOK
Freq: Once | Status: AC
  Administered 2024-04-03: 1
  Filled 2024-04-02: qty 1

## 2024-04-02 NOTE — Progress Notes (Signed)
 LTM maint complete - no skin breakdown under:  Fp1, P4

## 2024-04-02 NOTE — Progress Notes (Signed)
 NAME:  Alison Lopez, MRN:  [id number], DOB:  03-29-1956, LOS: 3 ADMISSION DATE:  03/30/2024, CONSULTATION DATE: 5/22 REFERRING MD:  Dr Gladstone Lamer, CHIEF COMPLAINT: Seizure  History of Present Illness:  Patient is encephalopathic and/or intubated; therefore, history has been obtained from chart review.  68 year old female with past medical history as below, which is significant for COPD, dementia, diabetes, DVT, antiphospholipid syndrome and DVT on Eliquis , hypertension, lupus, hypothyroidism, and recent admission for subdural hematoma in April 2025 with bur hole evacuation.  Residual right-sided weakness.  She was discharged from SNF about 1 week prior to her presentation 5/21 with chief complaint of new onset seizure and altered mental status.  Seizures were indeed witnessed in the emergency department and she was given Ativan and Keppra.  She continued to have seizures despite treatment and was given additional Ativan and Keppra.  She was also started on ceftriaxone  for UTI.  Seizures were focal in nature with shoulder and facial twitching.  CT of the head showed decreasing subdural size.  MRI MRV showed multiple scattered foci of small infarcts.  She was transferred from [facility] to [facility] for continuous EEG monitoring.  She continued to seize even despite Vimpat load and was transferred to ICU for phenobarbital ministration and concern for airway.  PCCM is consulted.  Pertinent  Medical History   has a past medical history of Anxiety, Arthritis, Collagen vascular disease (HCC), COPD (chronic obstructive pulmonary disease) (HCC), DDD (degenerative disc disease), lumbar, Dementia (HCC), Depression, Diabetes mellitus without complication (HCC), DVT (deep venous thrombosis) (HCC), Fibromyalgia, GERD (gastroesophageal reflux disease), Graves' disease with exophthalmos, HLD (hyperlipidemia), Hypertension, Hypothyroidism, Lupus, and Osteoporosis.   Significant Hospital Events: Including procedures,  antibiotic start and stop dates in addition to other pertinent events   5/22 transferred to ICU for seizure and worsening mental status, intubated for airway protection, required pressors and line placement, L subclavian placed arterially, vascular consulted and removed, subclavian stents placed  5/23 Stable overnight, remains on low dose norepi. Had an episode of L sided shaking, seen by neuro, low suspicion sz and remains on LTM 5/24 remains off continuous sedation this a.m. and tolerating SBT but mentation remains very poor  Interim History / Subjective:  Movement to pain only, flickers to pain in all extremities, unable to  follow any commands  Objective    Blood pressure 129/64, pulse 81, temperature 99 F (37.2 C), temperature source Axillary, resp. rate 14, SpO2 100%.    Vent Mode: PRVC FiO2 (%):  [30 %] 30 % Set Rate:  [25 bmp] 25 bmp Vt Set:  [310 mL] 310 mL PEEP:  [5 cmH20] 5 cmH20 Plateau Pressure:  [9 cmH20-16 cmH20] 9 cmH20   Intake/Output Summary (Last 24 hours) at 04/02/2024 0911 Last data filed at 04/02/2024 0900 Gross per 24 hour  Intake 796.19 ml  Output 950 ml  Net -153.81 ml   There were no vitals filed for this visit.  Physical Exam General: Acute on Chronically ill appearing thin decondition middle aged  female lying in bed on mechanical ventilation, in NAD HEENT: ETT, MM pink/moist, PERRL,  Neuro: Flicker to pain In all extremities only, unable to follow commands  CV: s1s2 regular rate and rhythm, no murmur, rubs, or gallops,  PULM:  Clear to auscultation, no increased work of breathing, no added breath sounds  GI: soft, bowel sounds active in all 4 quadrants, non-tender, non-distended, tolerating TF Extremities: warm/dry, no edema  Skin: no rashes or lesions  Resolved problem list  Assessment and Plan   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 Head of bed elevated 30 degrees. Plateau pressures less than 30 cm H20.  Follow intermittent chest x-ray and ABG.   SAT/SBT as tolerated, mentation preclude extubation  Ensure adequate pulmonary hygiene  Follow cultures  VAP bundle in place  PAD protocol Continue ceftriaxone   Sepsis secondary to acute cystitis: -Urine culture obtained at [facility] with 30,000 colonies of E. Coli resistant to Cipro and nitrofurantoin P: Ceftriaxone  as above, stop date in place  Antiphospholipid syndrome  Chronic Thrombocytopenia  -with history of DVT on chronic Eliquis  P: Low-dose Home Eliquis  resumed 5/28 Trend CBC   Hypokalemia Hypomagnesemia P: Trend Bment Supplement as needed  Hypothyroid P: Continue home Synthroid   Malpositioned L subclavian CVC -removed by vascular surgery 5/22 -Per vascular given subclavian stenting patient would benefit from low-dose aspirin  to maintain stent patency moving forward P: Continue low-dose aspirin    Best Practice (right click and "Reselect all SmartList Selections" daily)   Diet/type: tubefeeds DVT prophylaxis SCD Pressure ulcer(s): pressure ulcer assessment deferred  GI prophylaxis: N/A Lines: Central line Foley:  N/A Code Status:  full code Last date of multidisciplinary goals of care discussion [family updated at the bedside 5/22]  Critical care time:  CRITICAL CARE Performed  by: Lama Narayanan D. Harris   Total critical care time: 40 minutes  Critical care time was exclusive of separately billable procedures and treating other patients.  Critical care was necessary to treat or prevent imminent or life-threatening deterioration.  Critical care was time spent personally by me on the following activities: development of treatment plan with patient and/or surrogate as well as nursing, discussions with consultants, evaluation of patient's response to treatment, examination of patient, obtaining history from patient or surrogate, ordering and performing treatments and interventions, ordering and review of laboratory studies, ordering and review of radiographic studies, pulse oximetry and re-evaluation of patient's condition.  Ksean Vale D. Harris, NP-C [facility] Personal contact information can be found on Amion  If no contact or response made please call [phone] 04/02/2024, 9:57 AM

## 2024-04-02 NOTE — Progress Notes (Signed)
 [facility] ADULT ICU REPLACEMENT PROTOCOL FOR AM LAB REPLACEMENT ONLY  The patient does apply for the [facility] Adult ICU Electrolyte Replacment Protocol based on the criteria listed below:   1. Is GFR >/= 40 ml/min? Yes.    Patient's GFR today is >60 2. Is urine output >/= 0.5 ml/kg/hr for the last 6 hours? Yes.   3. Is BUN < 60 mg/dL? Yes.    Patient's BUN today is 7 4. Abnormal electrolyte(s): K 3.6, Mg 1.8 5. Ordered repletion with: Mg 2gm IV, KCl 40mEq per tube  [name], PharmD, BCPS Please see amion for complete clinical pharmacist phone list 04/02/2024 11:46 AM

## 2024-04-02 NOTE — Procedures (Addendum)
 Patient Name: SHOUA ULLOA  MRN: [id number]  Epilepsy Attending: Arleene Lack  Referring Physician/Provider: Ronnette Coke, MD  Duration: 04/01/2024 1759 to 04/02/2024 1759   Patient history: 68 y.o. female with a history of seizure who is undergoing an EEG to evaluate for seizures.   Level of alertness:  comatose   AEDs during EEG study: LEV,  LCM   Technical aspects: This EEG study was done with scalp electrodes positioned according to the 10-20 International system of electrode placement. Electrical activity was reviewed with band pass filter of 1-70Hz , sensitivity of 7 uV/mm, display speed of 14mm/sec with a 60Hz  notched filter applied as appropriate. EEG data were recorded continuously and digitally stored.  Video monitoring was available and reviewed as appropriate.   Description: EEG showed continuous generalized 3-6hz  theta-delta slowing admixed with 13-15hz  beta activity in fronto-central region. Abundant sharp waves were noted in left centro-parietal region, qasi-periodic every 3-6 seconds. Hyperventilation and photic stimulation were not performed.       ABNORMALITY - Sharp waves, left centro-parietal region - Continuous slow, generalized   IMPRESSION:  This study showed evidence of epileptogenicity arising from left centro- parietal region. Additionally there was moderate to severe diffuse encephalopathy. No seizures were noted.  Sajan Cheatwood O Isai Gottlieb

## 2024-04-02 NOTE — Progress Notes (Signed)
 EEG tech notified about patient status; seizure like activity.  Patient heart rate sustaining 130-140, peak pressure high on vent, and left upper extremity posturing. Pupils non-reactive bilaterally. PRN versed  and fentanyl  given as ordered.

## 2024-04-02 NOTE — Progress Notes (Addendum)
 NEUROLOGY CONSULT FOLLOW UP NOTE   Date of service: Apr 02, 2024 Patient Name: Alison Lopez MRN:  [id number] DOB:  1956-01-03  HPI / course 68 yo patient with hx dementia, antiphospholipid syndrome on low dose Eliquis  now due to recent spontaneous SDH in March s/p burr hole evacuation who presented 5/19 evening for complex partial seizures to [facility] Head CT showed further decrease size of the previously seen mixed density left subdural hematoma, now 3 mm in thickness compared to 7 mm previously.    She was loaded on Keppra  20 mg/kg on admission and started on 500 mg twice daily.  She has had 2 complex partial seizures 5/01 both aborted with Ativan , for which she was given 2 g additional keppra  and increased to 750 mg to be started this evening and then continued every 12 hours after that, esclated to max dose for renal function of 1000 mg BID on 5/21 evening   She was on Eliquis  5 mg twice daily when she had the subdural in March.  She has been restarted now as an outpatient on Eliquis  2.5 mg twice daily to reduce the risk of arterial and venous thrombosis.  This was held on admission due to thrombocytopenia with platelets of 59, but restarted by primary team on 5/21 evening.    MRI brain / MRV obtained to rule out acute infarct and/or venous sinus thrombosis contributing -- no CVST, but positive for small stroke [vs. Seizure related changes]    She is also on ceftriaxone  for UTI.    Continued to have declining mental status and focal seizures, 5/21 to 5/22 overnight and loaded with Vimpat , Phenobarb, intubated and started on Propofol  and Depakote  5/23 had some left upper extremity tremor/posturing which was suppressible and left lower extremity tremor, nonepileptic  Interval Hx/subjective  Propofol  off since 1 PM on 5/23 Low-dose Eliquis  restarted after stable head CT obtained  Vitals   Vitals:   04/02/24 0845 04/02/24 0900 04/02/24 0915 04/02/24 0930  BP: (!) 142/76 129/64 121/74  135/77  Pulse: 95 81 70 (!) 103  Resp: 16 14 15 12   Temp:      TempSrc:      SpO2: 99% 100% 100% 99%    There is no height or weight on file to calculate BMI.  Physical Exam   Constitutional: Appears frail and chronically ill, intubated and sedated in the ICU Psych: Not interactive Eyes: Mild scleral edema right > left HENT: ETT in place, secured with securement device Head: Normocephalic. cEEG remains in place Cardiovascular: Normal rate and regular rhythm.  Respiratory: breathing over the vent  Neuro:  Mental Status: Opens eyes to noxious stimulation plus voice.  Does not follow any commands Cranial Nerves: II: No blink to threat. Right pupil > left, right pupil is slightly irregular, left pupil round, both sluggishly reactive to light III,IV, VI/VIII: Gaze midline V/VII: Corneals intact to light eyelash brush VIII: Opens eyes to voice plus noxious stimulation (does not open eyes to either alone) X/XI: Weak cough XII: Does not protrude tongue to command 2/2 patient mental status  Motor/Sensory: More responsive in the left upper and lower extremity than on the right but withdraws in all 4 Cerebellar: Unable to assess secondary to patient's mental status       Medications  Current Facility-Administered Medications:    0.9 %  sodium chloride  infusion (Manually program via Guardrails IV Fluids), , Intravenous, Once, Schuh, McKenzi P, PA-C, Held at 03/31/24 4098   acetaminophen  (TYLENOL ) 160  MG/5ML solution 650 mg, 650 mg, Per Tube, Q6H PRN, 650 mg at 04/01/24 2110 **OR** acetaminophen  (TYLENOL ) suppository 650 mg, 650 mg, Rectal, Q6H PRN, Schuh, McKenzi P, PA-C   apixaban  (ELIQUIS ) tablet 2.5 mg, 2.5 mg, Per Tube, BID, Gleason, Patt Boozer, PA-C, 2.5 mg at 04/02/24 3086   cefTRIAXone  (ROCEPHIN ) 2 g in sodium chloride  0.9 % 100 mL IVPB, 2 g, Intravenous, Q24H, Byrum, Delora [place], MD, Stopped at 04/01/24 2143   Chlorhexidine  Gluconate Cloth 2 % PADS 6 each, 6 each, Topical, Daily,  Schuh, McKenzi P, PA-C, 6 each at 04/02/24 0913   famotidine  (PEPCID ) tablet 20 mg, 20 mg, Per Tube, BID, Schuh, McKenzi P, PA-C, 20 mg at 04/02/24 0913   feeding supplement (PROSource TF20) liquid 60 mL, 60 mL, Per Tube, Daily, Harris, Whitney D, NP   feeding supplement (VITAL HIGH PROTEIN) liquid 1,000 mL, 1,000 mL, Per Tube, Q24H, Harris, Whitney D, NP   fentaNYL  (SUBLIMAZE ) injection 25-100 mcg, 25-100 mcg, Intravenous, Q30 min PRN, Schuh, McKenzi P, PA-C, 50 mcg at 03/31/24 5784   fentaNYL  in NS (10mcg/ml) infusion-PREMIX, 0-400 mcg/hr, Intravenous, Continuous, Schuh, McKenzi P, PA-C, Held at 03/31/24 6962   lacosamide (VIMPAT) 100 mg in sodium chloride  0.9 % 25 mL IVPB, 100 mg, Intravenous, BID, Schuh, McKenzi P, PA-C, Stopped at 04/01/24 2247   levETIRAcetam (KEPPRA) undiluted injection 1,000 mg, 1,000 mg, Intravenous, BID, Schuh, McKenzi P, PA-C, 1,000 mg at 04/02/24 0913   levothyroxine  (SYNTHROID ) tablet 125 mcg, 125 mcg, Per Tube, Q0600, Schuh, McKenzi P, PA-C, 125 mcg at 04/02/24 0500   midazolam  (VERSED ) injection 1-2 mg, 1-2 mg, Intravenous, Q1H PRN, Schuh, McKenzi P, PA-C   norepinephrine (LEVOPHED) 4mg  in (0.016 mg/mL) premix infusion, 0-40 mcg/min, Intravenous, Titrated, Schuh, McKenzi P, PA-C, Last Rate: 7.5 mL/hr at 04/02/24 0900, 2 mcg/min at 04/02/24 0900   Oral care mouth rinse, 15 mL, Mouth Rinse, Q2H, Schuh, McKenzi P, PA-C, 15 mL at 04/02/24 0913   Oral care mouth rinse, 15 mL, Mouth Rinse, PRN, Schuh, McKenzi P, PA-C   PHENYLephrine  80 mcg/ml in normal saline Adult IV Push Syringe (For Blood Pressure Support), 80-200 mcg, Intravenous, Once PRN, Schuh, McKenzi P, PA-C   polyethylene glycol (MIRALAX  / GLYCOLAX ) packet 17 g, 17 g, Per Tube, Daily, Schuh, McKenzi P, PA-C, 17 g at 04/01/24 0959   propofol  (DIPRIVAN ) 1000 MG/100ML infusion, 0-80 mcg/kg/min, Intravenous, Titrated, Gleason, Patt Boozer, PA-C, Stopped at 04/01/24 1315   rosuvastatin  (CRESTOR ) tablet  20 mg, 20 mg, Per Tube, QHS, Schuh, McKenzi P, PA-C, 20 mg at 04/01/24 2110   sodium chloride  flush (NS) 0.9 % injection 10-40 mL, 10-40 mL, Intracatheter, Q12H, Schuh, McKenzi P, PA-C, 10 mL at 04/02/24 0913   sodium chloride  flush (NS) 0.9 % injection 10-40 mL, 10-40 mL, Intracatheter, PRN, Schuh, McKenzi P, PA-C   sodium chloride  flush (NS) 0.9 % injection 3 mL, 3 mL, Intravenous, Q12H, Schuh, McKenzi P, PA-C, 3 mL at 04/02/24 0914  Labs and Diagnostic Imaging  Basic Metabolic Panel: Recent Labs  Lab 03/28/24 2133 03/29/24 0738 03/30/24 0544 03/30/24 0545 03/30/24 1803 03/31/24 0536 03/31/24 0549 03/31/24 0919 04/01/24 0452 04/02/24 0458  NA 135  --   --  145 144 141 145 144 139 143  K 2.8*  --   --  2.9* 3.0* 3.5 3.3* 3.1* 3.9 3.6  CL 104  --   --  115* 115* 116*  --   --  111 107  CO2 22  --   --  22 21* 19*  --   --  22 23  GLUCOSE 112*  --   --  129* 109* 113*  --   --  121* 102*  BUN 15  --   --  <5* <5* <5*  --   --  <5* 7*  CREATININE 0.69  --   --  0.62 0.80 0.72  --   --  0.70 0.71  CALCIUM  8.4*  --   --  7.8* 8.2* 8.0*  --   --  8.0* 8.2*  MG 1.8 1.5* 1.7  --  1.6*  --   --   --  2.2 1.9  PHOS 3.5  --  2.6  --   --   --   --   --  3.3  --    CBC: Recent Labs  Lab 03/28/24 2133 03/30/24 0545 03/31/24 0112 03/31/24 0536 03/31/24 0549 03/31/24 0919 04/01/24 0452 04/02/24 0458  WBC 7.6 8.9 18.0* 9.9  --   --  12.0* 10.2  NEUTROABS 5.8  --  14.7*  --   --   --  9.4* 7.8*  HGB 11.4* 10.2* 9.4* 10.2* 9.5* 8.5* 8.9* 8.5*  HCT 33.5* 31.1* 28.0* 31.5* 28.0* 25.0* 27.4* 26.3*  MCV 83.8 84.7 84.8 86.5  --   --  87.8 87.4  PLT 59* 54* 97* 60*  --   --  78* 67*   Coagulation Studies: No results for input(s): "LABPROT", "INR" in the last 72 hours.   Lipid Panel: Lab Results  Component Value Date   CHOL 86 03/31/2024   HDL 26 (L) 03/31/2024   LDLCALC 43 03/31/2024   TRIG 60 04/01/2024   CHOLHDL 3.3 03/31/2024   HgbA1c:  Lab Results  Component Value Date    HGBA1C 4.5 (L) 03/31/2024   Urine Drug Screen:     Component Value Date/Time   LABOPIA NONE DETECTED 03/29/2024 0110   COCAINSCRNUR NONE DETECTED 03/29/2024 0110   LABBENZ POSITIVE (A) 03/29/2024 0110   AMPHETMU NONE DETECTED 03/29/2024 0110   THCU NONE DETECTED 03/29/2024 0110   LABBARB NONE DETECTED 03/29/2024 0110    Alcohol Level     Component Value Date/Time   ETH <10 01/29/2024 1307   INR  Lab Results  Component Value Date   INR 1.3 (H) 03/28/2024   APTT  Lab Results  Component Value Date   APTT 38 (H) 03/31/2024   AED levels: No results found for: "PHENYTOIN", "ZONISAMIDE", "LAMOTRIGINE", "LEVETIRACETA"  5/19 CT Head without contrast(Personally reviewed): Further decreased size of the previously seen mixed density left subdural hematoma, now 3 mm in thickness compared to 7 mm previously. 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 Atrophy, chronic microvascular disease. No acute intracranial abnormality.   5/21 MRI Brain(Personally reviewed): Multiple scattered foci of infarct involving the cortex and subcortical white matter in the bilateral occipital lobes, parietal lobes, and left temporal occipital lobes. Infarcts likely of varying ages some of which appear subacute. Redemonstrated subdural collection over the left cerebral convexity measuring 3 mm in thickness. Dural thickening and enhancement over the left cerebral convexities likely reactive. No evidence of venous sinus thrombosis.  No significant stenosis. Moderate chronic microvascular ischemic changes. Multiple remote infarcts as above.  5/23 CTA  head and neck  1. High-grade stenosis in the proximal left M1 segment and occlusion of the superior left M2 segment with poor anterior collaterals. 2. Occlusion of the left P1 segment proximally and hypoplastic right PCA with scattered segmental narrowing. 3. Atherosclerotic changes in the aortic  arch, vessel origins, left carotid bifurcation, and cavernous internal  carotid arteries bilaterally without significant stenosis.  5/20 rEEG This EEG was obtained while awake and asleep and is abnormal due to mild-to-moderate diffuse slowing indicative of global cerebral dysfunction. Epileptiform abnormalities were not seen during this recording.    03/30/2024 1759 to 03/31/2024 1759 - Focal convulsive status epilepticus, left centro-parietal region - Burst suppression, generalized IMPRESSION:  At the beginning of the study, patient had near continuous twitching of right shoulder. 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 Patient was intubated and started on propofol . Subsequently, status epilepticus resolved and was suggestive of profound diffuse encephalopathy likely due to sedation.  03/31/24 - 04/01/24  This study showed evidence of epileptogenicity arising from left centro- parietal region. Additionally there was profound diffuse encephalopathy likely due to sedation. No seizures were noted. Event button was pressed on 04/01/2024 at 0834 for left shoulder tremor-like movements without concomitant EEG change.  This was most likely not an epileptic event.  04/01/2024 1759 to 04/02/2024 0730  - Sharp waves, left centro-parietal region - Continuous slow, generalized This study showed evidence of epileptogenicity arising from left centro- parietal region. Additionally there was moderate to severe diffuse encephalopathy. No seizures were noted.   ECHO 5/22  1. Left ventricular ejection fraction, by estimation, is 55 to 60%. The  left ventricle has normal function. The left ventricle has no regional  wall motion abnormalities. Left ventricular diastolic parameters are  consistent with Grade II diastolic  dysfunction (pseudonormalization).   2. Right ventricular systolic function is low normal. The right  ventricular size is normal.   3. Left atrial size was moderately dilated.   4. 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 There is at  least moderate centrally directed mitral regurgitation and mild prolapse  of posterior mitral valve leaflets.   5. The aortic valve is normal in structure. There is mild calcification  of the aortic valve. Aortic valve regurgitation is not visualized. No  aortic stenosis is present. There is moderate calcification and thickening  of the aortic valve annulus.   6. The inferior vena cava is normal in size with greater than 50%  respiratory variability, suggesting right atrial pressure of 3 mmHg.    Assessment   Alison Lopez is a 68 y.o. female presenting w/ focal status in the setting of UTI.  Suspect she is still metabolizing phenobarbital loading dose.  Will also reduce Keppra as her renal function is borderline for 1000 mg twice daily; reducing to 750 twice daily today.    Recommendations   # Focal status w/ complex partial seizures, now in suppression - Continue LTM EEG - Reduce Keppra to 750 mg BID given renal function and sedation Estimated Creatinine Clearance: 43.8 mL/min (by C-G formula based on SCr of 0.71 mg/dL).   CrCl 50 to <80 mL/minute/1.73 m2: 500 mg to 1 g every 12 hours.  CrCl 30 to <50 mL/minute/1.73 m2: 250 to 750 mg every 12 hours. - Vimpat 200 mg IV at 2256, continue on 100 mg BID - s/p Depakote 1000 mg at 0015 5/22; will try to avoid standing doses of this due to the potential effect on platelets - s/p Phenobarb 20 mg/kg at 0348 5/22, holding further doses - Appreciate use of fentanyl /Precedex for sedation if needed to allow confirmation of seizure control without drips - Would next add Fycompa if seizures recur - Appreciate vent management and management of other co morbidities  per primary  # antiphospholipid antibody syndrome # SDH, stable # Arterial central line s/p repair - s/p 2 u platelets for thrombocytopenia - From neurology perspective, strokes are small and at least some of these may be seizure  related changes, okay to continue low dose eliquis  when okay from medical perspective; depending on clinical course may repeat Head CT prior to initiation  - Appreciate vascular surgery repair on 5/22  # UTI - appreciate antibiotics per CCM  # small strokes vs. at least some seizure related changes Likely related to her antiphospholipid antibody syndrome off of anticoagulation due to her spontaneous SDH.  - HgbA1c, fasting lipid panel meeting goal - Continue home Crestor  20 mg nightly - ECHO grade II diastolic dysfunction, EF 55-60%, moderate LAE, myxomatous mitral valve w/ mild prolapse - CTA with likely chronic occlusions as detailed under data above - Continues on low-dose Eliquis  due to SDH and thrombocytopenia - Telemetry monitoring - Blood pressure goal   - Normotension as out of the window for permissive hypertension - PT consult, OT consult, Speech consult, to be ordered pending medical stability - Stroke order set utilized for quality metrics ______________________________________________________________________  CRITICAL CARE Performed by: Ronnette Coke   Total critical care time: 35 minutes  Critical care time was exclusive of separately billable procedures and treating other patients.  Critical care was necessary to treat or prevent imminent or life-threatening deterioration.  Critical care was time spent personally by me on the following activities: development of treatment plan with patient and/or surrogate as well as nursing, discussions with consultants, evaluation of patient's response to treatment, examination of patient, obtaining history from patient or surrogate, ordering and performing treatments and interventions, ordering and review of laboratory studies, ordering and review of radiographic studies, pulse oximetry and re-evaluation of patient's condition.

## 2024-04-03 ENCOUNTER — Inpatient Hospital Stay (HOSPITAL_COMMUNITY)

## 2024-04-03 DIAGNOSIS — R569 Unspecified convulsions: Secondary | ICD-10-CM | POA: Diagnosis not present

## 2024-04-03 DIAGNOSIS — J9601 Acute respiratory failure with hypoxia: Secondary | ICD-10-CM | POA: Diagnosis not present

## 2024-04-03 DIAGNOSIS — A419 Sepsis, unspecified organism: Secondary | ICD-10-CM | POA: Diagnosis not present

## 2024-04-03 DIAGNOSIS — N390 Urinary tract infection, site not specified: Secondary | ICD-10-CM | POA: Diagnosis not present

## 2024-04-03 DIAGNOSIS — D6861 Antiphospholipid syndrome: Secondary | ICD-10-CM | POA: Diagnosis not present

## 2024-04-03 DIAGNOSIS — I6200 Nontraumatic subdural hemorrhage, unspecified: Secondary | ICD-10-CM | POA: Diagnosis not present

## 2024-04-03 LAB — GLUCOSE, CAPILLARY
Glucose-Capillary: 114 mg/dL — ABNORMAL HIGH (ref 70–99)
Glucose-Capillary: 118 mg/dL — ABNORMAL HIGH (ref 70–99)
Glucose-Capillary: 131 mg/dL — ABNORMAL HIGH (ref 70–99)
Glucose-Capillary: 144 mg/dL — ABNORMAL HIGH (ref 70–99)
Glucose-Capillary: 194 mg/dL — ABNORMAL HIGH (ref 70–99)
Glucose-Capillary: 207 mg/dL — ABNORMAL HIGH (ref 70–99)

## 2024-04-03 LAB — BASIC METABOLIC PANEL WITH GFR
Anion gap: 5 (ref 5–15)
BUN: 15 mg/dL (ref 8–23)
CO2: 24 mmol/L (ref 22–32)
Calcium: 7.7 mg/dL — ABNORMAL LOW (ref 8.9–10.3)
Chloride: 111 mmol/L (ref 98–111)
Creatinine, Ser: 0.64 mg/dL (ref 0.44–1.00)
GFR, Estimated: >60 mL/min (ref >60)
Glucose, Bld: 128 mg/dL — ABNORMAL HIGH (ref 70–99)
Potassium: 3.7 mmol/L (ref 3.5–5.1)
Sodium: 140 mmol/L (ref 135–145)

## 2024-04-03 LAB — MAGNESIUM
Magnesium: 2.2 mg/dL (ref 1.7–2.4)
Magnesium: 1.9 mg/dL (ref 1.7–2.4)

## 2024-04-03 LAB — PHOSPHORUS
Phosphorus: 2.7 mg/dL (ref 2.5–4.6)
Phosphorus: 2.3 mg/dL — ABNORMAL LOW (ref 2.5–4.6)

## 2024-04-03 LAB — CBC WITH DIFFERENTIAL/PLATELET
Abs Immature Granulocytes: 0.03 10*3/uL (ref 0.00–0.07)
Basophils Absolute: 0.1 10*3/uL (ref 0.0–0.1)
Basophils Relative: 1 %
Eosinophils Absolute: 0.2 10*3/uL (ref 0.0–0.5)
Eosinophils Relative: 3 %
HCT: 24.5 % — ABNORMAL LOW (ref 36.0–46.0)
Hemoglobin: 7.8 g/dL — ABNORMAL LOW (ref 12.0–15.0)
Immature Granulocytes: 0 %
Lymphocytes Relative: 11 %
Lymphs Abs: 0.8 10*3/uL (ref 0.7–4.0)
MCH: 28 pg (ref 26.0–34.0)
MCHC: 31.8 g/dL (ref 30.0–36.0)
MCV: 87.8 fL (ref 80.0–100.0)
Monocytes Absolute: 0.5 10*3/uL (ref 0.1–1.0)
Monocytes Relative: 7 %
Neutro Abs: 6 10*3/uL (ref 1.7–7.7)
Neutrophils Relative %: 78 %
Platelets: 55 10*3/uL — ABNORMAL LOW (ref 150–400)
RBC: 2.79 MIL/uL — ABNORMAL LOW (ref 3.87–5.11)
RDW: 14.8 % (ref 11.5–15.5)
WBC: 7.6 10*3/uL (ref 4.0–10.5)
nRBC: 0 % (ref 0.0–0.2)

## 2024-04-03 MED ORDER — PERAMPANEL 2 MG PO TABS
12.0000 mg | ORAL_TABLET | Freq: Once | ORAL | Status: AC
  Administered 2024-04-03: 12 mg
  Filled 2024-04-03: qty 6

## 2024-04-03 MED ORDER — POTASSIUM CHLORIDE 20 MEQ PO PACK
40.0000 meq | PACK | Freq: Once | ORAL | Status: AC
Start: 2024-04-03 — End: 2024-04-03
  Administered 2024-04-03: 40 meq
  Filled 2024-04-03: qty 2

## 2024-04-03 MED ORDER — OSMOLITE 1.5 CAL PO LIQD
1000.0000 mL | ORAL | Status: DC
Start: 2024-04-03 — End: 2024-04-11
  Administered 2024-04-03 – 2024-04-09 (×4): 1000 mL
  Filled 2024-04-03: qty 1000

## 2024-04-03 MED ORDER — ADULT MULTIVITAMIN W/MINERALS CH
1.0000 | ORAL_TABLET | Freq: Every day | ORAL | Status: DC
  Administered 2024-04-03 – 2024-04-10 (×8): 1
  Filled 2024-04-03 (×8): qty 1

## 2024-04-03 MED ORDER — THIAMINE MONONITRATE 100 MG PO TABS
100.0000 mg | ORAL_TABLET | Freq: Every day | ORAL | Status: AC
  Administered 2024-04-03 – 2024-04-09 (×7): 100 mg
  Filled 2024-04-03 (×7): qty 1

## 2024-04-03 NOTE — Progress Notes (Signed)
 NAME:  Alison Lopez, MRN:  [id number], DOB:  02-24-1956, LOS: 4 ADMISSION DATE:  03/30/2024, CONSULTATION DATE: 5/22 REFERRING MD:  Dr Gladstone Lamer, CHIEF COMPLAINT: Seizure  History of Present Illness:  Patient is encephalopathic and/or intubated; therefore, history has been obtained from chart review.  68 year old female with past medical history as below, which is significant for COPD, dementia, diabetes, DVT, antiphospholipid syndrome and DVT on Eliquis , hypertension, lupus, hypothyroidism, and recent admission for subdural hematoma in April 2025 with bur hole evacuation.  Residual right-sided weakness.  She was discharged from SNF about 1 week prior to her presentation 5/21 with chief complaint of new onset seizure and altered mental status.  Seizures were indeed witnessed in the emergency department and she was given Ativan and Keppra.  She continued to have seizures despite treatment and was given additional Ativan and Keppra.  She was also started on ceftriaxone  for UTI.  Seizures were focal in nature with shoulder and facial twitching.  CT of the head showed decreasing subdural size.  MRI MRV showed multiple scattered foci of small infarcts.  She was transferred from [facility] to [facility] for continuous EEG monitoring.  She continued to seize even despite Vimpat load and was transferred to ICU for phenobarbital ministration and concern for airway.  PCCM is consulted.  Pertinent  Medical History   has a past medical history of Anxiety, Arthritis, Collagen vascular disease (HCC), COPD (chronic obstructive pulmonary disease) (HCC), DDD (degenerative disc disease), lumbar, Dementia (HCC), Depression, Diabetes mellitus without complication (HCC), DVT (deep venous thrombosis) (HCC), Fibromyalgia, GERD (gastroesophageal reflux disease), Graves' disease with exophthalmos, HLD (hyperlipidemia), Hypertension, Hypothyroidism, Lupus, and Osteoporosis.   Significant Hospital Events: Including procedures,  antibiotic start and stop dates in addition to other pertinent events   5/22 transferred to ICU for seizure and worsening mental status, intubated for airway protection, required pressors and line placement, L subclavian placed arterially, vascular consulted and removed, subclavian stents placed  5/23 Stable overnight, remains on low dose norepi. Had an episode of L sided shaking, seen by neuro, low suspicion sz and remains on LTM 5/24 remains off continuous sedation this a.m. and tolerating SBT but mentation remains very poor  Interim History / Subjective:  No overnight issues.  EEG reviewed and two events of abnormal movements did not correlate with epileptic activity, however concern for ongoing seizure in discussing with neurology.  Objective    Blood pressure (!) 127/52, pulse 79, temperature 99.4 F (37.4 C), temperature source Oral, resp. rate 13, weight 52.2 kg, SpO2 98%.    Vent Mode: PSV;CPAP FiO2 (%):  [30 %] 30 % Set Rate:  [18 bmp] 18 bmp Vt Set:  [310 mL] 310 mL PEEP:  [5 cmH20] 5 cmH20 Pressure Support:  [5 cmH20] 5 cmH20 Plateau Pressure:  [7 cmH20-13 cmH20] 7 cmH20   Intake/Output Summary (Last 24 hours) at 04/03/2024 0920 Last data filed at 04/03/2024 0915 Gross per 24 hour  Intake 1167.2 ml  Output 695 ml  Net 472.2 ml   Filed Weights   04/03/24 0700  Weight: 52.2 kg    Physical Exam Gen:      Intubated, sedated, acutely ill appearing, thin HEENT:  ETT to vent Lungs:    sounds of mechanical ventilation auscultated, coarse bilateral rhonchi CV:         RRR Abd:      + bowel sounds; soft, non-tender; no palpable masses, no distension Ext:    1+ pitting edema Skin:  Warm and dry; no rashes Neuro:  does not withdraw to noxious stimuli, pupils reactive, +cough and gag   Resolved problem list   Assessment and Plan   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  LTM per neurology Seizure precautions Secondary stroke prevention  Acute hypoxic respiratory failure Concern for aspiration P: Mentation currently precludes extubation Continue ventilator support with lung protective strategies  Wean PEEP and FiO2 for sats greater than 90%. Head of bed elevated 30 degrees. Plateau pressures less than 30 cm H20.  Follow intermittent chest x-ray and ABG.   SAT/SBT as tolerated, mentation preclude extubation  Ensure adequate pulmonary hygiene  Follow cultures  VAP bundle in place  PAD protocol  Sepsis secondary to acute cystitis: -Urine culture obtained at [facility] with 30,000 colonies of E. Coli resistant to Cipro and nitrofurantoin P: Ceftriaxone  as above, stop date in place  Antiphospholipid syndrome  Chronic Thrombocytopenia  -with history of DVT on chronic Eliquis  P: Low-dose Home Eliquis  resumed 5/28 but unable to tolerate full dose which still puts her at risk for further clotting episodes.  Trend CBC   Hypokalemia Hypomagnesemia P: Trend Bment Supplement as needed  Hypothyroid P: Continue home Synthroid   Malpositioned L subclavian CVC -removed by vascular surgery 5/22 -Per vascular given subclavian stenting patient would benefit from low-dose aspirin to maintain stent patency moving forward P: Continue low-dose aspirin   Best Practice (right click and "Reselect all SmartList Selections" daily)   Diet/type: tubefeeds DVT prophylaxis SCD Pressure ulcer(s): pressure ulcer assessment deferred  GI prophylaxis: N/A Lines: Central line Foley:  N/A Code  Status:  full code Last date of multidisciplinary goals of care discussion Psychologist, educational updated by phone 5/25. She plans to come in either later today or tomorrow morning. ]  Critical care time:   The patient is critically ill due to respiratory failure, encephalopathy.  Critical care was necessary to treat or prevent imminent or life-threatening deterioration.  Critical care was time spent personally by me on the following activities: development of treatment plan with patient and/or surrogate as well as nursing, discussions with consultants, evaluation of patient's response to treatment, examination of patient, obtaining history from patient or surrogate, ordering and performing treatments and interventions, ordering and review of laboratory studies, ordering and review of radiographic studies, pulse oximetry, re-evaluation of patient's condition and participation in multidisciplinary rounds.   Critical Care Time devoted to patient care services described in this note is 45 minutes. This time reflects time of care of this signee Tag Wurtz S Breckin Zafar . This critical care time does not reflect separately billable procedures or procedure time, teaching time or supervisory time of PA/NP/Med student/Med Resident etc but could involve care discussion time.       Quentin Brunner Pulmonary and Critical Care Medicine 04/03/2024 9:20 AM  Pager: see AMION  If no response to pager , please call critical care on call (see AMION) until 7pm After 7:00 pm call Elink

## 2024-04-03 NOTE — Procedures (Signed)
 atient Name: Alison Lopez  MRN: [id number]  Epilepsy Attending: Arleene Lack  Referring Physician/Provider: Ronnette Coke, MD  Duration: 04/02/2024 1759 to 04/03/2024 0630   Patient history: 68 y.o. female with a history of seizure who is undergoing an EEG to evaluate for seizures.   Level of alertness:  comatose   AEDs during EEG study: LEV,  LCM   Technical aspects: This EEG study was done with scalp electrodes positioned according to the 10-20 International system of electrode placement. Electrical activity was reviewed with band pass filter of 1-70Hz , sensitivity of 7 uV/mm, display speed of 69mm/sec with a 60Hz  notched filter applied as appropriate. EEG data were recorded continuously and digitally stored.  Video monitoring was available and reviewed as appropriate.   Description: EEG showed continuous generalized 3-6hz  theta-delta slowing admixed with 13-15hz  beta activity in fronto-central region.  Lateralized periodic discharges were noted in left hemisphere, maximal centro-parietal region every 3 seconds. Hyperventilation and photic stimulation were not performed.     Event button was pressed on 04/02/2024 at 1931 for right hand twitching and left eye upper gaze deviation ( unable to see on camera). Concomitant eeg before, during and after the event didn't show any eeg change to suggest seizure.  Event button was pressed on 04/02/2024 at 2338 for left hand posturing and hear rate of 140. Concomitant eeg before, during and after the event didn't show any eeg change to suggest seizure.    ABNORMALITY - Lateralized periodic discharges, left hemisphere, left centro-parietal region - Continuous slow, generalized   IMPRESSION:  This study showed evidence of epileptogenicity arising from left centro- parietal region with increased risk of seizure recurrence. Additionally there was moderate to severe diffuse encephalopathy. No definite seizures were noted.  Event button was pressed  on 04/02/2024 at 1931 for right hand twitching and left eye upper gaze deviation ( unable to see on camera) without concomitant eeg change. This was most likely not an epileptic event.  Event button was pressed on 04/02/2024 at 2338 for left hand posturing and hear rate of 140 without concomitant eeg change. This was most likely not an epileptic event.   Alison Lopez O [name]

## 2024-04-03 NOTE — Progress Notes (Signed)
 Initial Nutrition Assessment  DOCUMENTATION CODES:   Not applicable  INTERVENTION:   Tube feeding via OG tube: Osmolite 1.5 at 40 ml/h (960 ml per day)  Prosource TF20 60 ml daily  Provides 1520 kcal, 80 gm protein, 729 ml free water daily  100 mg thiamine daily x 7 days  MVI with minerals daily   NUTRITION DIAGNOSIS:   Inadequate oral intake related to inability to eat as evidenced by NPO status.  GOAL:   Patient will meet greater than or equal to 90% of their needs  MONITOR:   TF tolerance  REASON FOR ASSESSMENT:   Consult Enteral/tube feeding initiation and management  ASSESSMENT:   Pt with PMH of COPD, dementia, DM, HTN, lupus, DVT and antiphospholipid syndrome, and recent admission in 02/2024 for SDH s/p bur hole evacuation with residual R-sided weakness now admitted from SNF with new onset seizure and AMS.   Pt remains intubated  5/22 - intubated 5/24 - TF protocol initiated  Medications reviewed and include: pepcid , keppra, synthroid , miralax , 40 mEq KCl x 1 Fentanyl    Labs reviewed:  A1C 4.5 CBG 118   18 F OG tube; gastric per CT  NUTRITION - FOCUSED PHYSICAL EXAM:  Deferred to follow up   Diet Order:   Diet Order             Diet NPO time specified  Diet effective now                   EDUCATION NEEDS:   Not appropriate for education at this time  Skin:  Skin Assessment: Reviewed RN Assessment  Last BM:  5/24 type 7 via FMS  Height:   Ht Readings from Last 1 Encounters:  03/28/24 4\' 9"  (1.448 m)    Weight:   Wt Readings from Last 1 Encounters:  04/03/24 52.2 kg    BMI:  Body mass index is 24.9 kg/m.  Estimated Nutritional Needs:   Kcal:  1300-1500  Protein:  65-75 grams  Fluid:  >1.5 L/day  Randine Butcher., RD, LDN, CNSC See AMiON for contact information

## 2024-04-03 NOTE — Progress Notes (Signed)
 NEUROLOGY CONSULT FOLLOW UP NOTE   Date of service: Apr 03, 2024 Patient Name: Alison Lopez MRN:  [id number] DOB:  [birth year]  HPI / course 68 yo patient with hx dementia, antiphospholipid syndrome on low dose Eliquis  now due to recent spontaneous SDH in March s/p burr hole evacuation who presented 5/19 evening for complex partial seizures to [facility]. Head CT showed further decrease size of the previously seen mixed density left subdural hematoma, now 3 mm in thickness compared to 7 mm previously.    She was loaded on Keppra 20 mg/kg on admission and started on 500 mg twice daily.  She has had 2 complex partial seizures 5/01 both aborted with Ativan, for which she was given 2 g additional keppra and increased to 750 mg to be started this evening and then continued every 12 hours after that, esclated to max dose for renal function of 1000 mg BID on 5/21 evening   She was on Eliquis  5 mg twice daily when she had the subdural in March.  She has been restarted now as an outpatient on Eliquis  2.5 mg twice daily to reduce the risk of arterial and venous thrombosis.  This was held on admission due to thrombocytopenia with platelets of 59, but restarted by primary team on 5/21 evening.    MRI brain / MRV obtained to rule out acute infarct and/or venous sinus thrombosis contributing -- no CVST, but positive for small stroke [vs. Seizure related changes]    She is also on ceftriaxone  for UTI.    Continued to have declining mental status and focal seizures, 5/21 to 5/22 overnight and loaded with Vimpat, Phenobarb, intubated and started on Propofol  and Depakote  5/23 had some left upper extremity tremor/posturing which was suppressible and left lower extremity tremor, nonepileptic  Interval Hx/subjective  Focal right hand twitching started last night Non-epileptic event -- HR increase to 130s-140s with L arm decerebrate posturing  Vitals   Vitals:   04/03/24 0304 04/03/24 0400 04/03/24 0700 04/03/24  0800  BP:   (!) 104/53 (!) 127/52  Pulse: 76  79 79  Resp: 16  16 13   Temp:  99.8 F (37.7 C)  99.4 F (37.4 C)  TempSrc:  Axillary  Oral  SpO2: 99%  99% 98%  Weight:   52.2 kg     Body mass index is 24.9 kg/m.  Physical Exam   Constitutional: Appears frail and chronically ill, intubated and sedated in the ICU Psych: Not interactive Eyes: Mild scleral edema right > left HENT: ETT in place, secured with securement device Head: Normocephalic. cEEG remains in place Cardiovascular: Normal rate and regular rhythm.  Respiratory: breathing over the vent  Neuro:  Mental Status: Opens eyes to noxious stimulation plus voice, but less briskly than yesterday.  Does not follow any commands Cranial Nerves: II: No blink to threat. Right pupil > left, right pupil is slightly irregular, left pupil round, both sluggishly reactive to light but left more brisk than right III,IV, VI/VIII: Gaze mildly dysconjugate  V/VII: Corneals intact to light eyelash brush VIII: Opens eyes to voice plus noxious stimulation (does not open eyes to either alone) X/XI: Weak cough XII: Does not protrude tongue to command 2/2 patient mental status  Motor/Sensory: Continuous twitching of the RUE but does also withdraw slightly to sustained noxious stim. Localizes with LUE (slight movement when noxious stim applied to RUE). Minimal withdrawal vs reflexive 3x flexion bilateral LE to proximal stim only.  Cerebellar: Unable to assess secondary to  patient's mental status    Medications  Current Facility-Administered Medications:    0.9 %  sodium chloride  infusion (Manually program via Guardrails IV Fluids), , Intravenous, Once, Schuh, McKenzi P, PA-C, Held at 03/31/24 4098   acetaminophen  (TYLENOL ) 160 MG/5ML solution 650 mg, 650 mg, Per Tube, Q6H PRN, 650 mg at 04/01/24 2110 **OR** acetaminophen  (TYLENOL ) suppository 650 mg, 650 mg, Rectal, Q6H PRN, Schuh, McKenzi P, PA-C   apixaban  (ELIQUIS ) tablet 2.5 mg, 2.5 mg,  Per Tube, BID, Gleason, Patt Boozer, PA-C, 2.5 mg at 04/03/24 0913   aspirin chewable tablet 81 mg, 81 mg, Per Tube, Daily, Desai, Nikita S, MD, 81 mg at 04/03/24 0913   cefTRIAXone  (ROCEPHIN ) 2 g in sodium chloride  0.9 % 100 mL IVPB, 2 g, Intravenous, Q24H, Byrum, Delora Ferry, MD, Stopped at 04/02/24 2126   Chlorhexidine  Gluconate Cloth 2 % PADS 6 each, 6 each, Topical, Daily, Schuh, McKenzi P, PA-C, 6 each at 04/03/24 0913   famotidine  (PEPCID ) tablet 20 mg, 20 mg, Per Tube, BID, Schuh, McKenzi P, PA-C, 20 mg at 04/03/24 0913   feeding supplement (PROSource TF20) liquid 60 mL, 60 mL, Per Tube, Daily, Harris, Whitney D, NP, 60 mL at 04/03/24 0913   feeding supplement (VITAL HIGH PROTEIN) liquid 1,000 mL, 1,000 mL, Per Tube, Q24H, Harris, Whitney D, NP, Infusion Verify at 04/03/24 0800   fentaNYL  (SUBLIMAZE ) injection 25-100 mcg, 25-100 mcg, Intravenous, Q30 min PRN, Schuh, McKenzi P, PA-C, 100 mcg at 04/03/24 0231   fentaNYL  in NS (10mcg/ml) infusion-PREMIX, 0-400 mcg/hr, Intravenous, Continuous, Schuh, McKenzi P, PA-C, Held at 03/31/24 1191   lacosamide (VIMPAT) 100 mg in sodium chloride  0.9 % 25 mL IVPB, 100 mg, Intravenous, BID, Schuh, McKenzi P, PA-C, Stopped at 04/02/24 2225   levETIRAcetam (KEPPRA) undiluted injection 750 mg, 750 mg, Intravenous, BID, Donnelle Rubey L, MD, 750 mg at 04/03/24 0912   levothyroxine  (SYNTHROID ) tablet 125 mcg, 125 mcg, Per Tube, Q0600, Schuh, McKenzi P, PA-C, 125 mcg at 04/03/24 0724   midazolam  (VERSED ) injection 1-2 mg, 1-2 mg, Intravenous, Q1H PRN, Schuh, McKenzi P, PA-C, 2 mg at 04/02/24 2337   Oral care mouth rinse, 15 mL, Mouth Rinse, Q2H, Schuh, McKenzi P, PA-C, 15 mL at 04/03/24 0913   Oral care mouth rinse, 15 mL, Mouth Rinse, PRN, Schuh, McKenzi P, PA-C   PHENYLephrine  80 mcg/ml in normal saline Adult IV Push Syringe (For Blood Pressure Support), 80-200 mcg, Intravenous, Once PRN, Schuh, McKenzi P, PA-C   polyethylene glycol (MIRALAX  / GLYCOLAX )  packet 17 g, 17 g, Per Tube, Daily, Schuh, McKenzi P, PA-C, 17 g at 04/01/24 0959   propofol  (DIPRIVAN ) 1000 MG/100ML infusion, 0-80 mcg/kg/min, Intravenous, Titrated, Gleason, Patt Boozer, PA-C, Stopped at 04/01/24 1315   rosuvastatin  (CRESTOR ) tablet 20 mg, 20 mg, Per Tube, QHS, Schuh, McKenzi P, PA-C, 20 mg at 04/02/24 2058   sodium chloride  flush (NS) 0.9 % injection 10-40 mL, 10-40 mL, Intracatheter, Q12H, Schuh, McKenzi P, PA-C, 10 mL at 04/03/24 0913   sodium chloride  flush (NS) 0.9 % injection 10-40 mL, 10-40 mL, Intracatheter, PRN, Schuh, McKenzi P, PA-C   sodium chloride  flush (NS) 0.9 % injection 3 mL, 3 mL, Intravenous, Q12H, Schuh, McKenzi P, PA-C, 3 mL at 04/03/24 0913  Labs and Diagnostic Imaging  Basic Metabolic Panel: Recent Labs  Lab 03/30/24 0544 03/30/24 0545 03/30/24 1803 03/31/24 0536 03/31/24 0549 03/31/24 0919 04/01/24 0452 04/02/24 0458 04/02/24 1010 04/02/24 1611 04/03/24 0517  NA  --    < >  144 141 145 144 139 143  --   --  140  K  --    < > 3.0* 3.5 3.3* 3.1* 3.9 3.6  --   --  3.7  CL  --    < > 115* 116*  --   --  111 107  --   --  111  CO2  --    < > 21* 19*  --   --  22 23  --   --  24  GLUCOSE  --    < > 109* 113*  --   --  121* 102*  --   --  128*  BUN  --    < > <5* <5*  --   --  <5* 7*  --   --  15  CREATININE  --    < > 0.80 0.72  --   --  0.70 0.71  --   --  0.64  CALCIUM   --    < > 8.2* 8.0*  --   --  8.0* 8.2*  --   --  7.7*  MG 1.7  --  1.6*  --   --   --  2.2 1.9 1.8 2.5* 2.2  PHOS 2.6  --   --   --   --   --  3.3  --  3.3 3.1 2.7   < > = values in this interval not displayed.   CBC: Recent Labs  Lab 03/28/24 2133 03/30/24 0545 03/31/24 0112 03/31/24 0536 03/31/24 0549 03/31/24 0919 04/01/24 0452 04/02/24 0458 04/03/24 0517  WBC 7.6   < > 18.0* 9.9  --   --  12.0* 10.2 7.6  NEUTROABS 5.8  --  14.7*  --   --   --  9.4* 7.8* 6.0  HGB 11.4*   < > 9.4* 10.2* 9.5* 8.5* 8.9* 8.5* 7.8*  HCT 33.5*   < > 28.0* 31.5* 28.0* 25.0* 27.4* 26.3*  24.5*  MCV 83.8   < > 84.8 86.5  --   --  87.8 87.4 87.8  PLT 59*   < > 97* 60*  --   --  78* 67* 55*   < > = values in this interval not displayed.   Coagulation Studies: No results for input(s): "LABPROT", "INR" in the last 72 hours.   Lipid Panel: Lab Results  Component Value Date   CHOL 86 03/31/2024   HDL 26 (L) 03/31/2024   LDLCALC 43 03/31/2024   TRIG 60 04/01/2024   CHOLHDL 3.3 03/31/2024   HgbA1c:  Lab Results  Component Value Date   HGBA1C 4.5 (L) 03/31/2024   Urine Drug Screen:     Component Value Date/Time   LABOPIA NONE DETECTED 03/29/2024 0110   COCAINSCRNUR NONE DETECTED 03/29/2024 0110   LABBENZ POSITIVE (A) 03/29/2024 0110   AMPHETMU NONE DETECTED 03/29/2024 0110   THCU NONE DETECTED 03/29/2024 0110   LABBARB NONE DETECTED 03/29/2024 0110    Alcohol Level     Component Value Date/Time   ETH <10 01/29/2024 1307   INR  Lab Results  Component Value Date   INR 1.3 (H) 03/28/2024   APTT  Lab Results  Component Value Date   APTT 38 (H) 03/31/2024   AED levels: No results found for: "PHENYTOIN", "ZONISAMIDE", "LAMOTRIGINE", "LEVETIRACETA"  5/19 CT Head without contrast(Personally reviewed): Further decreased size of the previously seen mixed density left subdural hematoma, now 3 mm in thickness compared to 7 mm previously. 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 Atrophy, chronic microvascular disease. No acute intracranial abnormality.   5/21 MRI Brain(Personally reviewed): Multiple scattered foci of infarct involving the cortex and subcortical white matter in the bilateral occipital lobes, parietal lobes, and left temporal occipital lobes. Infarcts likely of varying ages some of which appear subacute. Redemonstrated subdural collection over the left cerebral convexity measuring 3 mm in thickness. Dural thickening and enhancement over the left cerebral convexities likely reactive. No evidence of venous sinus thrombosis.  No significant  stenosis. Moderate chronic microvascular ischemic changes. Multiple remote infarcts as above.  5/23 CTA  head and neck  1. High-grade stenosis in the proximal left M1 segment and occlusion of the superior left M2 segment with poor anterior collaterals. 2. Occlusion of the left P1 segment proximally and hypoplastic right PCA with scattered segmental narrowing. 3. Atherosclerotic changes in the aortic arch, vessel origins, left carotid bifurcation, and cavernous internal carotid arteries bilaterally without significant stenosis.  5/20 rEEG This EEG was obtained while awake and asleep and is abnormal due to mild-to-moderate diffuse slowing indicative of global cerebral dysfunction. Epileptiform abnormalities were not seen during this recording.    03/30/2024 1759 to 03/31/2024 1759 - Focal convulsive status epilepticus, left centro-parietal region - Burst suppression, generalized IMPRESSION:  At the beginning of the study, patient had near continuous twitching of right shoulder. 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 Patient was intubated and started on propofol . Subsequently, status epilepticus resolved and was suggestive of profound diffuse encephalopathy likely due to sedation.  03/31/24 - 04/01/24  This study showed evidence of epileptogenicity arising from left centro- parietal region. Additionally there was profound diffuse encephalopathy likely due to sedation. No seizures were noted. Event button was pressed on 04/01/2024 at 0834 for left shoulder tremor-like movements without concomitant EEG change.  This was most likely not an epileptic event.  04/01/2024 1759 to 04/02/2024 0730  - Sharp waves, left centro-parietal region - Continuous slow, generalized This study showed evidence of epileptogenicity arising from left centro- parietal region. Additionally there was moderate to severe diffuse encephalopathy. No seizures were  noted.  - Lateralized periodic discharges, left hemisphere, left centro-parietal region - Continuous slow, generalized   04/02/2024 1759 to 04/03/2024 0630  This study showed evidence of epileptogenicity arising from left centro- parietal region with increased risk of seizure recurrence. Additionally there was moderate to severe diffuse encephalopathy. No definite seizures were noted. Event button was pressed on 04/02/2024 at 1931 for right hand twitching and left eye upper gaze deviation ( unable to see on camera) without concomitant eeg change. This was most likely not an epileptic event. Event button was pressed on 04/02/2024 at 2338 for left hand posturing and hear rate of 140 without concomitant eeg change. This was most likely not an epileptic event.  ECHO 5/22  1. Left ventricular ejection fraction, by estimation, is 55 to 60%. The  left ventricle has normal function. The left ventricle has no regional  wall motion abnormalities. Left ventricular diastolic parameters are  consistent with Grade II diastolic  dysfunction (pseudonormalization).   2. Right ventricular systolic function is low normal. The right  ventricular size is normal.   3. Left atrial size was moderately dilated.   4. 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 There is at  least moderate centrally directed mitral regurgitation and mild prolapse  of posterior mitral valve leaflets.   5. The aortic valve is normal in structure. There is mild calcification  of the  aortic valve. Aortic valve regurgitation is not visualized. No  aortic stenosis is present. There is moderate calcification and thickening  of the aortic valve annulus.   6. The inferior vena cava is normal in size with greater than 50%  respiratory variability, suggesting right atrial pressure of 3 mmHg.    Assessment   Alison Lopez is a 68 y.o. female presenting w/ focal status in the setting of UTI.  Mental status remains quite poor;  with remergence of LPDs she is having recurrent right hand twitching so I favor this to be recurrent seizure. Will try fycompa today and repeat Head CT   Recommendations   # Focal status w/ complex partial seizures, now with recurrent LPDs - Continue LTM EEG - Keppra to 750 mg BID is max for current renal function  Estimated Creatinine Clearance: 46.1 mL/min (by C-G formula based on SCr of 0.64 mg/dL).   CrCl 30 to <50 mL/minute/1.73 m2: 250 to 750 mg every 12 hours. - Vimpat 200 mg IV at 2256, continue on 100 mg BID - s/p Depakote 1000 mg at 0015 5/22; will try to avoid standing doses of this due to the potential effect on platelets - s/p Phenobarb 20 mg/kg at 0348 5/22, holding further doses - Appreciate use of fentanyl /Precedex for sedation if needed to allow confirmation of seizure control without drips - Fycompa 12 mg x 1 loading dose - Appreciate vent management and management of other co morbidities per primary  # Episodes of left hand posturing of unclear etiology - repeat Head CT   # antiphospholipid antibody syndrome # SDH, stable on last eval # Arterial central line s/p repair # Thrombocytopenia, stable - s/p 2 u platelets for thrombocytopenia - From neurology perspective, strokes are small and at least some of these may be seizure related changes, okay to continue low dose eliquis  when okay from medical perspective; depending on clinical course may repeat Head CT prior to initiation  - Appreciate vascular surgery repair on 5/22  # UTI - appreciate antibiotics per CCM  # Small strokes vs. at least some seizure related changes Likely related to her antiphospholipid antibody syndrome off of anticoagulation due to her spontaneous SDH.  - HgbA1c, fasting lipid panel meeting goal - Continue home Crestor  20 mg nightly - ECHO grade II diastolic dysfunction, EF 55-60%, moderate LAE, myxomatous mitral valve w/ mild prolapse - CTA with likely chronic occlusions as detailed under  data above - Continues on low-dose Eliquis  due to SDH and thrombocytopenia - Telemetry monitoring - Blood pressure goal   - Normotension as out of the window for permissive hypertension - PT consult, OT consult, Speech consult, to be ordered pending medical stability - Stroke order set utilized for quality metrics  Neurology will follow, discussed with CCM at bedside ______________________________________________________________________  CRITICAL CARE Performed by: Ronnette Coke   Total critical care time: 35 minutes  Critical care time was exclusive of separately billable procedures and treating other patients.  Critical care was necessary to treat or prevent imminent or life-threatening deterioration.  Critical care was time spent personally by me on the following activities: development of treatment plan with patient and/or surrogate as well as nursing, discussions with consultants, evaluation of patient's response to treatment, examination of patient, obtaining history from patient or surrogate, ordering and performing treatments and interventions, ordering and review of laboratory studies, ordering and review of radiographic studies, pulse oximetry and re-evaluation of patient's condition.

## 2024-04-03 NOTE — Progress Notes (Signed)
 LTM maint complete - no skin breakdown under:  P4,Cz

## 2024-04-03 NOTE — Progress Notes (Signed)
 [facility] ADULT ICU REPLACEMENT PROTOCOL FOR AM LAB REPLACEMENT ONLY  The patient does apply for the [facility] Adult ICU Electrolyte Replacment Protocol based on the criteria listed below:   1. Is GFR >/= 40 ml/min? Yes.    Patient's GFR today is >60 2. Is urine output >/= 0.5 ml/kg/hr for the last 6 hours? Yes.   3. Is BUN < 60 mg/dL? Yes.    Patient's BUN today is 15 4. Abnormal electrolyte(s): K 3.7 5. Ordered repletion with: KCl 40mEq per tube  [name], PharmD, BCPS Please see amion for complete clinical pharmacist phone list 04/03/2024 9:27 AM

## 2024-04-03 NOTE — Progress Notes (Signed)
 Pt transported from 4N21 to CT01 and back by RN and RT w/o complications

## 2024-04-04 ENCOUNTER — Encounter (HOSPITAL_COMMUNITY)

## 2024-04-04 DIAGNOSIS — R4020 Unspecified coma: Secondary | ICD-10-CM | POA: Diagnosis not present

## 2024-04-04 DIAGNOSIS — G40909 Epilepsy, unspecified, not intractable, without status epilepticus: Secondary | ICD-10-CM | POA: Diagnosis not present

## 2024-04-04 DIAGNOSIS — G9340 Encephalopathy, unspecified: Secondary | ICD-10-CM | POA: Diagnosis not present

## 2024-04-04 DIAGNOSIS — R569 Unspecified convulsions: Secondary | ICD-10-CM | POA: Diagnosis not present

## 2024-04-04 DIAGNOSIS — A419 Sepsis, unspecified organism: Secondary | ICD-10-CM | POA: Diagnosis not present

## 2024-04-04 DIAGNOSIS — D6861 Antiphospholipid syndrome: Secondary | ICD-10-CM | POA: Diagnosis not present

## 2024-04-04 DIAGNOSIS — J9601 Acute respiratory failure with hypoxia: Secondary | ICD-10-CM | POA: Diagnosis not present

## 2024-04-04 LAB — BASIC METABOLIC PANEL WITH GFR
Anion gap: 7 (ref 5–15)
BUN: 19 mg/dL (ref 8–23)
CO2: 23 mmol/L (ref 22–32)
Calcium: 8 mg/dL — ABNORMAL LOW (ref 8.9–10.3)
Chloride: 111 mmol/L (ref 98–111)
Creatinine, Ser: 0.56 mg/dL (ref 0.44–1.00)
GFR, Estimated: >60 mL/min (ref >60)
Glucose, Bld: 181 mg/dL — ABNORMAL HIGH (ref 70–99)
Potassium: 4.2 mmol/L (ref 3.5–5.1)
Sodium: 141 mmol/L (ref 135–145)

## 2024-04-04 LAB — CBC WITH DIFFERENTIAL/PLATELET
Abs Immature Granulocytes: 0.04 10*3/uL (ref 0.00–0.07)
Basophils Absolute: 0 10*3/uL (ref 0.0–0.1)
Basophils Relative: 0 %
Eosinophils Absolute: 0.3 10*3/uL (ref 0.0–0.5)
Eosinophils Relative: 4 %
HCT: 27.7 % — ABNORMAL LOW (ref 36.0–46.0)
Hemoglobin: 8.7 g/dL — ABNORMAL LOW (ref 12.0–15.0)
Immature Granulocytes: 1 %
Lymphocytes Relative: 11 %
Lymphs Abs: 0.8 10*3/uL (ref 0.7–4.0)
MCH: 27.9 pg (ref 26.0–34.0)
MCHC: 31.4 g/dL (ref 30.0–36.0)
MCV: 88.8 fL (ref 80.0–100.0)
Monocytes Absolute: 0.5 10*3/uL (ref 0.1–1.0)
Monocytes Relative: 7 %
Neutro Abs: 5.5 10*3/uL (ref 1.7–7.7)
Neutrophils Relative %: 77 %
Platelets: 61 10*3/uL — ABNORMAL LOW (ref 150–400)
RBC: 3.12 MIL/uL — ABNORMAL LOW (ref 3.87–5.11)
RDW: 14.8 % (ref 11.5–15.5)
WBC: 7 10*3/uL (ref 4.0–10.5)
nRBC: 0 % (ref 0.0–0.2)

## 2024-04-04 LAB — TRIGLYCERIDES: Triglycerides: 54 mg/dL (ref <150)

## 2024-04-04 LAB — GLUCOSE, CAPILLARY
Glucose-Capillary: 193 mg/dL — ABNORMAL HIGH (ref 70–99)
Glucose-Capillary: 199 mg/dL — ABNORMAL HIGH (ref 70–99)
Glucose-Capillary: 164 mg/dL — ABNORMAL HIGH (ref 70–99)
Glucose-Capillary: 159 mg/dL — ABNORMAL HIGH (ref 70–99)
Glucose-Capillary: 159 mg/dL — ABNORMAL HIGH (ref 70–99)
Glucose-Capillary: 156 mg/dL — ABNORMAL HIGH (ref 70–99)

## 2024-04-04 LAB — MAGNESIUM: Magnesium: 1.8 mg/dL (ref 1.7–2.4)

## 2024-04-04 MED ORDER — MAGNESIUM SULFATE 2 GM/50ML IV SOLN
2.0000 g | Freq: Once | INTRAVENOUS | Status: AC
  Administered 2024-04-04: 2 g via INTRAVENOUS
  Filled 2024-04-04: qty 50

## 2024-04-04 MED ORDER — INSULIN ASPART 100 UNIT/ML IJ SOLN
0.0000 [IU] | INTRAMUSCULAR | Status: DC
  Administered 2024-04-04 – 2024-04-05 (×6): 2 [IU] via SUBCUTANEOUS
  Administered 2024-04-05: 1 [IU] via SUBCUTANEOUS
  Administered 2024-04-05 (×2): 2 [IU] via SUBCUTANEOUS
  Administered 2024-04-05 – 2024-04-07 (×10): 1 [IU] via SUBCUTANEOUS
  Administered 2024-04-07: 2 [IU] via SUBCUTANEOUS
  Administered 2024-04-07: 1 [IU] via SUBCUTANEOUS
  Administered 2024-04-07: 2 [IU] via SUBCUTANEOUS
  Administered 2024-04-08: 1 [IU] via SUBCUTANEOUS
  Administered 2024-04-08 – 2024-04-09 (×3): 2 [IU] via SUBCUTANEOUS
  Administered 2024-04-09 – 2024-04-10 (×6): 1 [IU] via SUBCUTANEOUS
  Administered 2024-04-10: 2 [IU] via SUBCUTANEOUS
  Administered 2024-04-10 (×2): 1 [IU] via SUBCUTANEOUS

## 2024-04-04 MED ORDER — FUROSEMIDE 10 MG/ML IJ SOLN
40.0000 mg | Freq: Once | INTRAMUSCULAR | Status: AC
  Administered 2024-04-04: 40 mg via INTRAVENOUS
  Filled 2024-04-04: qty 4

## 2024-04-04 NOTE — Procedures (Signed)
 Patient Name: Alison Lopez  MRN: [id number]  Epilepsy Attending: Arleene Lack  Referring Physician/Provider: Ronnette Coke, MD  Duration: 04/03/2024 1759 to 04/04/2024 1759   Patient history: 68 y.o. female with a history of seizure who is undergoing an EEG to evaluate for seizures.   Level of alertness:  comatose   AEDs during EEG study: LEV,  LCM, Perampanel   Technical aspects: This EEG study was done with scalp electrodes positioned according to the 10-20 International system of electrode placement. Electrical activity was reviewed with band pass filter of 1-70Hz , sensitivity of 7 uV/mm, display speed of 48mm/sec with a 60Hz  notched filter applied as appropriate. EEG data were recorded continuously and digitally stored.  Video monitoring was available and reviewed as appropriate.   Description: EEG showed continuous generalized 3-6hz  theta-delta slowing admixed with 13-15hz  beta activity in fronto-central region.  Lateralized periodic discharges were noted in left hemisphere, maximal centro-parietal region every 3 seconds. Hyperventilation and photic stimulation were not performed.        ABNORMALITY - Lateralized periodic discharges, left hemisphere, left centro-parietal region - Continuous slow, generalized   IMPRESSION:  This study showed evidence of epileptogenicity arising from left centro- parietal region with increased risk of seizure recurrence. Additionally there was moderate to severe diffuse encephalopathy. No definite seizures were noted.  Tyri Elmore O Ireene Ballowe

## 2024-04-04 NOTE — Progress Notes (Signed)
 Subjective: No acute events overnight.  Tmax 100.7 Fahrenheit.  Continues to be comatose.  ROS: Unable to obtain due to poor mental status  Examination  Vital signs in last 24 hours: Temp:  [99.1 F (37.3 C)-100.7 F (38.2 C)] 99.3 F (37.4 C) (05/26 0800) Pulse Rate:  [71-109] 95 (05/26 1100) Resp:  [13-20] 19 (05/26 1100) BP: (93-160)/(48-85) 122/57 (05/26 1100) SpO2:  [96 %-100 %] 100 % (05/26 1113) FiO2 (%):  [30 %-40 %] 40 % (05/26 1113) Weight:  [53.4 kg] 53.4 kg (05/26 0454)  General: lying in bed, NAD Neuro: Barely opens eyes to noxious stimulation, does not follow any commands, pupils reacting to light, no forced gaze deviation, no twitching noted today, does withdraw to noxious stimuli in all 4 extremities  Basic Metabolic Panel: Recent Labs  Lab 03/31/24 0536 03/31/24 0549 03/31/24 0919 04/01/24 0452 04/02/24 0458 04/02/24 1010 04/02/24 1611 04/03/24 0517 04/03/24 1755 04/04/24 0533  NA 141   < > 144 139 143  --   --  140  --  141  K 3.5   < > 3.1* 3.9 3.6  --   --  3.7  --  4.2  CL 116*  --   --  111 107  --   --  111  --  111  CO2 19*  --   --  22 23  --   --  24  --  23  GLUCOSE 113*  --   --  121* 102*  --   --  128*  --  181*  BUN <5*  --   --  <5* 7*  --   --  15  --  19  CREATININE 0.72  --   --  0.70 0.71  --   --  0.64  --  0.56  CALCIUM  8.0*  --   --  8.0* 8.2*  --   --  7.7*  --  8.0*  MG  --   --   --  2.2 1.9 1.8 2.5* 2.2 1.9 1.8  PHOS  --   --   --  3.3  --  3.3 3.1 2.7 2.3*  --    < > = values in this interval not displayed.    CBC: Recent Labs  Lab 03/31/24 0112 03/31/24 0536 03/31/24 0549 03/31/24 0919 04/01/24 0452 04/02/24 0458 04/03/24 0517 04/04/24 0533  WBC 18.0* 9.9  --   --  12.0* 10.2 7.6 7.0  NEUTROABS 14.7*  --   --   --  9.4* 7.8* 6.0 5.5  HGB 9.4* 10.2*   < > 8.5* 8.9* 8.5* 7.8* 8.7*  HCT 28.0* 31.5*   < > 25.0* 27.4* 26.3* 24.5* 27.7*  MCV 84.8 86.5  --   --  87.8 87.4 87.8 88.8  PLT 97* 60*  --   --  78* 67* 55*  61*   < > = values in this interval not displayed.     Coagulation Studies: No results for input(s): "LABPROT", "INR" in the last 72 hours.  Imaging personally reviewed  CT head without contrast 04/03/2024:No acute intracranial abnormality.  Stable subacute and remote infarcts in the posterior parietal and occipital lobes bilaterally, and remote cortical anterior frontal lobes bilaterally.  Stable small bilateral subdural collections.   ASSESSMENT AND PLAN:  68 y.o. female presenting w/ focal status in the setting of UTI.  Status epilepticus has resolved.  However patient continues to be comatose  Convulsive status epilepticus, resolved Epilepsy with breakthrough  seizure Acute encephalopathy due to seizures - No further seizures since perampanel load yesterday.  However still comatose  Recommendations - Continue Keppra 750 mg twice daily and Vimpat 100 mg twice daily - Continue EEG monitoring to look for intermittent seizures -If seizures occur, can start maintenance perampanel - Continue seizure precautions - As needed IV benzodiazepine for clinical seizures  I have spent a total of 36   minutes with the patient reviewing hospital notes,  test results, labs and examining the patient as well as establishing an assessment and plan.  > 50% of time was spent in direct patient care.         Roxy Cordial Epilepsy [facility] For questions after 5pm please refer to AMION to reach the Neurologist on call

## 2024-04-04 NOTE — Progress Notes (Signed)
 NAME:  Alison Lopez, MRN:  [id number], DOB:  1955/12/27, LOS: 5 ADMISSION DATE:  03/30/2024, CONSULTATION DATE: 5/22 REFERRING MD:  Dr Gladstone Lamer, CHIEF COMPLAINT: Seizure  History of Present Illness:  Patient is encephalopathic and/or intubated; therefore, history has been obtained from chart review.  68 year old female with past medical history as below, which is significant for COPD, dementia, diabetes, DVT, antiphospholipid syndrome and DVT on Eliquis , hypertension, lupus, hypothyroidism, and recent admission for subdural hematoma in April 2025 with bur hole evacuation.  Residual right-sided weakness.  She was discharged from SNF about 1 week prior to her presentation 5/21 with chief complaint of new onset seizure and altered mental status.  Seizures were indeed witnessed in the emergency department and she was given Ativan and Keppra.  She continued to have seizures despite treatment and was given additional Ativan and Keppra.  She was also started on ceftriaxone  for UTI.  Seizures were focal in nature with shoulder and facial twitching.  CT of the head showed decreasing subdural size.  MRI MRV showed multiple scattered foci of small infarcts.  She was transferred from [facility] to [facility] for continuous EEG monitoring.  She continued to seize even despite Vimpat load and was transferred to ICU for phenobarbital administration and concern for airway.  PCCM is consulted.  Pertinent  Medical History   has a past medical history of Anxiety, Arthritis, Collagen vascular disease (HCC), COPD (chronic obstructive pulmonary disease) (HCC), DDD (degenerative disc disease), lumbar, Dementia (HCC), Depression, Diabetes mellitus without complication (HCC), DVT (deep venous thrombosis) (HCC), Fibromyalgia, GERD (gastroesophageal reflux disease), Graves' disease with exophthalmos, HLD (hyperlipidemia), Hypertension, Hypothyroidism, Lupus, and Osteoporosis.   Significant Hospital Events: Including procedures,  antibiotic start and stop dates in addition to other pertinent events   5/22 transferred to ICU for seizure and worsening mental status, intubated for airway protection, required pressors and line placement, L subclavian placed arterially, vascular consulted and removed, subclavian stents placed  5/23 Stable overnight, remains on low dose norepi. Had an episode of L sided shaking, seen by neuro, low suspicion sz and remains on LTM 5/24 remains off continuous sedation this a.m. and tolerating SBT but mentation remains very poor 5/26 tol SBT, only responding to pain.   Interim History / Subjective:  No overnight issues.  EEG concerning for increased epileptogenicity left parietal, but no definite sz.  Minimally responsive off sedation Tolerating SBT well  Objective    Blood pressure 127/69, pulse 93, temperature 99.3 F (37.4 C), temperature source Oral, resp. rate 17, weight 53.4 kg, SpO2 100%.    Vent Mode: PSV;CPAP FiO2 (%):  [30 %-40 %] 40 % Set Rate:  [18 bmp] 18 bmp Vt Set:  [310 mL] 310 mL PEEP:  [5 cmH20] 5 cmH20 Pressure Support:  [5 cmH20] 5 cmH20   Intake/Output Summary (Last 24 hours) at 04/04/2024 0951 Last data filed at 04/04/2024 0900 Gross per 24 hour  Intake 1232.92 ml  Output 945 ml  Net 287.92 ml   Filed Weights   04/03/24 0700 04/04/24 0454  Weight: 52.2 kg 53.4 kg    Physical Exam: General:  thin elderly appearing female in NAD Neuro:  Eyes open to pain/sternal rub. R pupil is irregular. Not following any commands.  HEENT:  [place]/AT, No JVD noted Cardiovascular:  RRR, no MRG Lungs:  Clear bilateral breath sounds Abdomen:  Soft, non-distended, non-tender.  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  Continue ventilator support with lung protective strategies  Give lasix 40mg  VAP bundle Off sedation  Sepsis secondary to acute cystitis: -Urine culture obtained at [facility] with 30,000 colonies of E. Coli resistant to Cipro and nitrofurantoin Aspiration pneumonia concern P: Ceftriaxone  as above, stop date in place  Antiphospholipid syndrome  Chronic Thrombocytopenia  -with history of DVT on chronic Eliquis  P: Low-dose Home Eliquis  resumed 5/23 but unable to tolerate full dose which still puts her at risk for further clotting episodes.  Trend CBC   Hypokalemia Hypomagnesemia P: Trend Bment Supplement as needed  Hypothyroid P: Continue home Synthroid   Malpositioned L subclavian CVC -removed by vascular surgery 5/22 -Per vascular given subclavian stenting patient would benefit from low-dose aspirin to maintain stent patency moving forward P: Continue low-dose aspirin   Best Practice (right click and "Reselect all SmartList Selections" daily)   Diet/type: tubefeeds DVT prophylaxis SCD Pressure ulcer(s): pressure ulcer assessment deferred  GI prophylaxis: N/A Lines: Central line Foley:  N/A Code Status:  full code Last date of multidisciplinary goals of care discussion Psychologist, educational updated by phone 5/25. She plans to come in either later today or tomorrow morning. ]  Critical care time: 42 min    [name], AGACNP-BC [place] [facility]  See Amion for personal pager PCCM on call pager [phone] until 7pm. Please call Elink 7p-7a. [phone]  04/04/2024 10:26 AM

## 2024-04-04 NOTE — Progress Notes (Signed)
   04/04/24 0735  Daily Weaning Assessment  Daily Assessment of Readiness to Wean Wean protocol criteria met (SBT performed)  SBT Method CPAP 5 cm H20 and PS 5 cm H20  Weaning Start Time  (4098)  Patient response Passed (Tolerated well)     Pt placed on PS/CPAP 5/5 on 40% and is tolerating well. RT will monitor.

## 2024-04-05 DIAGNOSIS — Z7189 Other specified counseling: Secondary | ICD-10-CM

## 2024-04-05 DIAGNOSIS — A419 Sepsis, unspecified organism: Secondary | ICD-10-CM | POA: Diagnosis not present

## 2024-04-05 DIAGNOSIS — R569 Unspecified convulsions: Secondary | ICD-10-CM | POA: Diagnosis not present

## 2024-04-05 DIAGNOSIS — G9340 Encephalopathy, unspecified: Secondary | ICD-10-CM | POA: Diagnosis not present

## 2024-04-05 DIAGNOSIS — G40909 Epilepsy, unspecified, not intractable, without status epilepticus: Secondary | ICD-10-CM | POA: Diagnosis not present

## 2024-04-05 DIAGNOSIS — R4020 Unspecified coma: Secondary | ICD-10-CM | POA: Diagnosis not present

## 2024-04-05 DIAGNOSIS — Z515 Encounter for palliative care: Secondary | ICD-10-CM

## 2024-04-05 DIAGNOSIS — J9600 Acute respiratory failure, unspecified whether with hypoxia or hypercapnia: Secondary | ICD-10-CM

## 2024-04-05 DIAGNOSIS — D6861 Antiphospholipid syndrome: Secondary | ICD-10-CM | POA: Diagnosis not present

## 2024-04-05 DIAGNOSIS — J9601 Acute respiratory failure with hypoxia: Secondary | ICD-10-CM | POA: Diagnosis not present

## 2024-04-05 LAB — GLUCOSE, CAPILLARY
Glucose-Capillary: 126 mg/dL — ABNORMAL HIGH (ref 70–99)
Glucose-Capillary: 133 mg/dL — ABNORMAL HIGH (ref 70–99)
Glucose-Capillary: 139 mg/dL — ABNORMAL HIGH (ref 70–99)
Glucose-Capillary: 153 mg/dL — ABNORMAL HIGH (ref 70–99)
Glucose-Capillary: 155 mg/dL — ABNORMAL HIGH (ref 70–99)
Glucose-Capillary: 156 mg/dL — ABNORMAL HIGH (ref 70–99)
Glucose-Capillary: 166 mg/dL — ABNORMAL HIGH (ref 70–99)

## 2024-04-05 MED ORDER — FUROSEMIDE 10 MG/ML IJ SOLN
40.0000 mg | Freq: Once | INTRAMUSCULAR | Status: AC
  Administered 2024-04-05: 40 mg via INTRAVENOUS
  Filled 2024-04-05: qty 4

## 2024-04-05 MED ORDER — AMANTADINE HCL 100 MG PO CAPS
100.0000 mg | ORAL_CAPSULE | Freq: Two times a day (BID) | ORAL | Status: DC
  Administered 2024-04-05 – 2024-04-10 (×12): 100 mg
  Filled 2024-04-05 (×13): qty 1

## 2024-04-05 MED ORDER — AMANTADINE HCL 100 MG PO CAPS: 100.0000 mg | ORAL_CAPSULE | Freq: Two times a day (BID) | ORAL | Status: DC

## 2024-04-05 MED ORDER — LEVETIRACETAM (KEPPRA) 500 MG/5 ML ADULT IV PUSH
750.0000 mg | Freq: Two times a day (BID) | INTRAVENOUS | Status: DC
  Administered 2024-04-05 – 2024-04-09 (×9): 750 mg via INTRAVENOUS
  Filled 2024-04-05 (×9): qty 10

## 2024-04-05 MED ORDER — LEVETIRACETAM (KEPPRA) 500 MG/5 ML ADULT IV PUSH: 500.0000 mg | Freq: Two times a day (BID) | INTRAVENOUS | Status: DC

## 2024-04-05 NOTE — Progress Notes (Signed)
 NAME:  Alison Lopez, MRN:  [id number], DOB:  December 07, 1955, LOS: 6 ADMISSION DATE:  03/30/2024, CONSULTATION DATE: 5/22 REFERRING MD:  Dr Gladstone Lamer, CHIEF COMPLAINT: Seizure  History of Present Illness:  Patient is encephalopathic and/or intubated; therefore, history has been obtained from chart review.  68 year old female with past medical history as below, which is significant for COPD, dementia, diabetes, DVT, antiphospholipid syndrome and DVT on Eliquis , hypertension, lupus, hypothyroidism, and recent admission for subdural hematoma in April 2025 with bur hole evacuation.  Residual right-sided weakness.  She was discharged from SNF about 1 week prior to her presentation 5/21 with chief complaint of new onset seizure and altered mental status.  Seizures were indeed witnessed in the emergency department and she was given Ativan and Keppra.  She continued to have seizures despite treatment and was given additional Ativan and Keppra.  She was also started on ceftriaxone  for UTI.  Seizures were focal in nature with shoulder and facial twitching.  CT of the head showed decreasing subdural size.  MRI MRV showed multiple scattered foci of small infarcts.  She was [facility] from [facility] to [facility] for continuous EEG monitoring.  She continued to seize even despite Vimpat load and was transferred to ICU for phenobarbital administration and concern for airway.  PCCM is consulted.  Pertinent  Medical History   has a past medical history of Anxiety, Arthritis, Collagen vascular disease (HCC), COPD (chronic obstructive pulmonary disease) (HCC), DDD (degenerative disc disease), lumbar, Dementia (HCC), Depression, Diabetes mellitus without complication (HCC), DVT (deep venous thrombosis) (HCC), Fibromyalgia, GERD (gastroesophageal reflux disease), Graves' disease with exophthalmos, HLD (hyperlipidemia), Hypertension, Hypothyroidism, Lupus, and Osteoporosis.   Significant Hospital Events: Including procedures,  antibiotic start and stop dates in addition to other pertinent events   5/22 transferred to ICU for seizure and worsening mental status, intubated for airway protection, required pressors and line placement, L subclavian placed arterially, vascular consulted and removed, subclavian stents placed  5/23 Stable overnight, remains on low dose norepi. Had an episode of L sided shaking, seen by neuro, low suspicion sz and remains on LTM 5/24 remains off continuous sedation this a.m. and tolerating SBT but mentation remains very poor 5/26 tol SBT, only responding to pain.   Interim History / Subjective:  Still comatose but no further seizures  Objective    Blood pressure (!) 116/59, pulse 82, temperature 100 F (37.8 C), temperature source Axillary, resp. rate 15, weight 51.7 kg, SpO2 100%.    Vent Mode: PSV;CPAP FiO2 (%):  [40 %] 40 % Set Rate:  [18 bmp] 18 bmp Vt Set:  [310 mL] 310 mL PEEP:  [5 cmH20] 5 cmH20 Pressure Support:  [5 cmH20] 5 cmH20 Plateau Pressure:  [6 cmH20-9 cmH20] 9 cmH20   Intake/Output Summary (Last 24 hours) at 04/05/2024 0851 Last data filed at 04/05/2024 0700 Gross per 24 hour  Intake 1325 ml  Output 2565 ml  Net -1240 ml   Filed Weights   04/03/24 0700 04/04/24 0454 04/05/24 0211  Weight: 52.2 kg 53.4 kg 51.7 kg    Physical Exam: Gen:      Intubated, frail, acutely ill appearing HEENT:  ETT to vent, tongue enlarged Lungs:    sounds of mechanical ventilation auscultated no wheeze CV:         RRR Abd:      + bowel sounds; soft, non-tender; no palpable masses, no distension Ext:    No edema Skin:      Warm and dry; no rashes Neuro:  off sedation, pupils sluggish but reactive. No purposeful movements, occasional rhythmic twitching of left fingers noted on exam today  Labs and imaging reviewed WBC 7.0 Hgb 8.7 plt 61 Na 141 K 4.2, Cr 0.56  Resolved problem list   Assessment and Plan   Focal status epilepticus with post-ictal encephalopathy Acute  infarcts with extensive chronic cerebral cortex infarction -CTA head and neck with multiple areas of high grade stenosis Spontaneous subdural hematoma in the setting of low-dose Eliquis  in March 2025 S/p bur hole evacuation and recurrent admissions P: Primary management per neurology AEDs per neurology (Vimpat, Keppra, Fycompa) LTM per neurology Seizure precautions Secondary stroke prevention  Acute hypoxic respiratory failure Concern for aspiration P: Mentation currently precludes extubation - she is able to to breathe spontaneously Continue ventilator support with lung protective strategies  Give lasix 40mg  today to maintain euvolemia VAP bundle Off sedation  Sepsis secondary to acute cystitis: -Urine culture obtained at [facility] with 30,000 colonies of E. Coli resistant to Cipro and nitrofurantoin Aspiration pneumonia concern P: Completed 5 days ceftriaxone  today  Antiphospholipid syndrome  Chronic Thrombocytopenia  -with history of DVT on chronic Eliquis  P: Low-dose Home Eliquis  resumed 5/23 but unable to tolerate full dose which still puts her at risk for further clotting episodes.  Trend CBC   Hypothyroid P: Continue home Synthroid   Malpositioned L subclavian CVC -removed by vascular surgery 5/22 -Per vascular given subclavian stenting patient would benefit from low-dose aspirin to maintain stent patency moving forward P: Continue low-dose aspirin   Best Practice (right click and "Reselect all SmartList Selections" daily)   Diet/type: tubefeeds DVT prophylaxis SCD Pressure ulcer(s): pressure ulcer assessment deferred  GI prophylaxis: N/A Lines: Central line Foley:  N/A Code Status:  full code Last date of multidisciplinary goals of care discussion Psychologist, educational updated by phone 5/25. She has not come to the hospital yet. ]  Critical care time:    The patient is critically ill due to respiratory failure, encephalopathy.  Critical care was necessary to treat  or prevent imminent or life-threatening deterioration.  Critical care was time spent personally by me on the following activities: development of treatment plan with patient and/or surrogate as well as nursing, discussions with consultants, evaluation of patient's response to treatment, examination of patient, obtaining history from patient or surrogate, ordering and performing treatments and interventions, ordering and review of laboratory studies, ordering and review of radiographic studies, pulse oximetry, re-evaluation of patient's condition and participation in multidisciplinary rounds.   Critical Care Time devoted to patient care services described in this note is 38 minutes. This time reflects time of care of this signee Kieth Hartis S Deaken Jurgens . This critical care time does not reflect separately billable procedures or procedure time, teaching time or supervisory time of PA/NP/Med student/Med Resident etc but could involve care discussion time.       Quentin Brunner [facility] 04/05/2024 8:51 AM  Pager: see AMION  If no response to pager , please call critical care on call (see AMION) until 7pm After 7:00 pm call Elink

## 2024-04-05 NOTE — Plan of Care (Signed)
  Problem: Clinical Measurements: Goal: Diagnostic test results will improve Outcome: Progressing Goal: Respiratory complications will improve Outcome: Progressing Goal: Cardiovascular complication will be avoided Outcome: Progressing   Problem: Nutrition: Goal: Adequate nutrition will be maintained Outcome: Progressing   Problem: Coping: Goal: Level of anxiety will decrease Outcome: Progressing   Problem: Elimination: Goal: Will not experience complications related to bowel motility Outcome: Progressing Goal: Will not experience complications related to urinary retention Outcome: Progressing   Problem: Pain Managment: Goal: General experience of comfort will improve and/or be controlled Outcome: Progressing

## 2024-04-05 NOTE — Consult Note (Signed)
 Palliative Care Consult Note                                  Date: 04/05/2024   Patient Name: Alison Lopez  DOB: 07-28-1956  MRN: [id number]  Age / Sex: 68 y.o., female  PCP: Alexander Anes, MD Referring Physician: Aleck Hurdle, MD  Reason for Consultation: {Reason for Consult:23484}  Past Medical History:  Diagnosis Date   Anxiety    Arthritis    Collagen vascular disease (HCC)    COPD (chronic obstructive pulmonary disease) (HCC)    DDD (degenerative disc disease), lumbar    Dementia (HCC)    Depression    Diabetes mellitus without complication (HCC)    DVT (deep venous thrombosis) (HCC)    Fibromyalgia    GERD (gastroesophageal reflux disease)    Graves' disease with exophthalmos    HLD (hyperlipidemia)    Hypertension    Hypothyroidism    Lupus    Osteoporosis     Subjective:   This NP Lizbeth Right reviewed medical records, received report from team, assessed the patient and then meet at the patient's bedside to discuss diagnosis, prognosis, GOC, EOL wishes disposition and options.  Before meeting with the patient/family, I spent time reviewing the chart including ***.  I met with ***.   We meet to discuss diagnosis prognosis, GOC, EOL wishes, disposition and options. Concept of Palliative Care was introduced as specialized medical care for people and their families living with serious illness.  If focuses on providing relief from the symptoms and stress of a serious illness.  The goal is to improve quality of life for both the patient and the family. Values and goals of care important to patient and family were attempted to be elicited.  Created space and opportunity for patient  and family to explore thoughts and feelings regarding current medical situation   Natural trajectory and current clinical status were discussed. Questions and concerns addressed. Patient  encouraged to call with questions or concerns.     Patient/Family Understanding of Illness: ***  Life Review: ***  Patient Values: ***  Goals: ***  Today's Discussion: ***  Review of Systems  Objective:   Primary Diagnoses: Present on Admission: **None**   Vital Signs:  BP 109/71   Pulse 94   Temp 98.7 F (37.1 C) (Axillary)   Resp 17   Wt 51.7 kg   SpO2 100%   BMI 24.66 kg/m   Physical Exam  Palliative Assessment/Data: ***   Advanced Care Planning:   Existing Vynca/ACP Documentation: ***  Primary Decision Maker: {Primary Decision WUJWJ:19147}  Pertinent diagnosis: ***  The patient and/or family consented to a voluntary Advance Care Planning Conversation in person/over the phone***. Individuals present for the conversation:  Summary of the conversation: ***  Outcome of the conversations and/or documents completed: ***  I spent *** minutes providing separately identifiable ACP services with the patient and/or surrogate decision maker in a voluntary, in-person conversation discussing the patient's wishes and goals as detailed in the above note.  Assessment & Plan:   HPI/Patient Profile: 68 y.o. female  with past medical history of *** admitted on 03/30/2024 with ***.   SUMMARY OF RECOMMENDATIONS   ***  Symptom Management:  ***  Code Status: {Palliative Code status:23503}  Prognosis:  {Palliative Care Prognosis:23504}  Discharge Planning:  {Palliative dispostion:23505}   Discussed with: ***    Thank you for  allowing us  to participate in the care of COLUMBIA PANDEY PMT will continue to support holistically.  Time Total: ***  Detailed review of medical records (labs, imaging, vital signs), medically appropriate exam, discussed with treatment team, counseling and education to patient, family, & staff, documenting clinical information, medication management, coordination of care  Signed by: Lizbeth Right, NP Palliative Medicine Team  Team Phone # [phone] (Nights/Weekends)   04/05/2024, 5:29 PM

## 2024-04-05 NOTE — Progress Notes (Addendum)
 Subjective: Alison Lopez. Opening eyes, on pressor support  ROS: Unable to obtain due to poor mental status  Examination  Vital signs in last 24 hours: Temp:  [99.4 F (37.4 C)-100.1 F (37.8 C)] 99.4 F (37.4 C) (05/27 0800) Pulse Rate:  [78-122] 87 (05/27 1000) Resp:  [13-19] 17 (05/27 1000) BP: (95-146)/(47-90) 127/69 (05/27 1000) SpO2:  [99 %-100 %] 100 % (05/27 1056) FiO2 (%):  [40 %] 40 % (05/27 1056) Weight:  [51.7 kg] 51.7 kg (05/27 0211)  General: lying in bed, intubated, tongue swollen and protruding Neuro: Opens eyes to repeated tactile stimulation, does not follow any commands, pupils reacting to light, no forced gaze deviation,  does withdraw to noxious stimuli in all 4 extremities, had brief twitching in right hand after stimulation  Basic Metabolic Panel: Recent Labs  Lab 03/31/24 0536 03/31/24 0549 03/31/24 0919 04/01/24 0452 04/02/24 0458 04/02/24 1010 04/02/24 1611 04/03/24 0517 04/03/24 1755 04/04/24 0533  NA 141   < > 144 139 143  --   --  140  --  141  K 3.5   < > 3.1* 3.9 3.6  --   --  3.7  --  4.2  CL 116*  --   --  111 107  --   --  111  --  111  CO2 19*  --   --  22 23  --   --  24  --  23  GLUCOSE 113*  --   --  121* 102*  --   --  128*  --  181*  BUN <5*  --   --  <5* 7*  --   --  15  --  19  CREATININE 0.72  --   --  0.70 0.71  --   --  0.64  --  0.56  CALCIUM  8.0*  --   --  8.0* 8.2*  --   --  7.7*  --  8.0*  MG  --   --   --  2.2 1.9 1.8 2.5* 2.2 1.9 1.8  PHOS  --   --   --  3.3  --  3.3 3.1 2.7 2.3*  --    < > = values in this interval not displayed.    CBC: Recent Labs  Lab 03/31/24 0112 03/31/24 0536 03/31/24 0549 03/31/24 0919 04/01/24 0452 04/02/24 0458 04/03/24 0517 04/04/24 0533  WBC 18.0* 9.9  --   --  12.0* 10.2 7.6 7.0  NEUTROABS 14.7*  --   --   --  9.4* 7.8* 6.0 5.5  HGB 9.4* 10.2*   < > 8.5* 8.9* 8.5* 7.8* 8.7*  HCT 28.0* 31.5*   < > 25.0* 27.4* 26.3* 24.5* 27.7*  MCV 84.8 86.5  --   --  87.8 87.4 87.8 88.8  PLT 97* 60*   --   --  78* 67* 55* 61*   < > = values in this interval not displayed.     Coagulation Studies: No results for input(s): "LABPROT", "INR" in the last 72 hours.  Imaging No new brain imaging   ASSESSMENT AND PLAN:68 y.o. female presenting w/ focal status in the setting of UTI.  Status epilepticus has resolved.  However patient continues to be comatose   Convulsive status epilepticus, resolved Epilepsy with breakthrough seizure Acute encephalopathy due to seizures - Brief right hand twitching is likely focal motor seizure   Recommendations - Patient has been very sedated on higher doses of anti-seizure meds. Therefore for now  continue Keppra  750 mg twice daily and Vimpat  100 mg twice daily - if seizure worsens, may need to resume Perampanel  maintenance - Continue EEG monitoring to look for intermittent seizures - Palliative care consult as patient has poor neurocognitive reserve and now prolonged seizures -Add amantadine  100mg  BID for neurostimulation - Continue seizure precautions - As needed IV benzodiazepine for clinical seizures - Discussed plan with ICU team   I have spent a total of 35 minutes with the patient reviewing hospital notes,  test results, labs and examining the patient as well as establishing an assessment and plan.  > 50% of time was spent in direct patient care.    Roxy Cordial Epilepsy [facility] For questions after 5pm please refer to AMION to reach the Neurologist on call

## 2024-04-05 NOTE — Progress Notes (Signed)
 Pt placed on PS/CPAP 5/5 on 40% and is tolerating well. RT will monitor.

## 2024-04-05 NOTE — Procedures (Signed)
 Patient Name: Alison Lopez  MRN: [id number]  Epilepsy Attending: Arleene Lack  Referring Physician/Provider: Ronnette Coke, MD  Duration: 04/04/2024 1759 to 04/05/2024 1759  Patient history: 68 y.o. female with a history of seizure who is undergoing an EEG to evaluate for seizures.   Level of alertness:  comatose   AEDs during EEG study: LEV,  LCM, Perampanel   Technical aspects: This EEG study was done with scalp electrodes positioned according to the 10-20 International system of electrode placement. Electrical activity was reviewed with band pass filter of 1-70Hz , sensitivity of 7 uV/mm, display speed of 31mm/sec with a 60Hz  notched filter applied as appropriate. EEG data were recorded continuously and digitally stored.  Video monitoring was available and reviewed as appropriate.   Description: EEG showed continuous generalized 3-6hz  theta-delta slowing admixed with 13-15hz  beta activity in fronto-central region.  Lateralized periodic discharges were noted in left hemisphere, maximal centro-parietal region every 3 seconds. Hyperventilation and photic stimulation were not performed.     Event button was pressed on 04/05/2024 at 0858 for right hand jerking. Concomitant EEG before, during and after the event did not show any EEG changes.      ABNORMALITY - Lateralized periodic discharges, left hemisphere, left centro-parietal region - Continuous slow, generalized   IMPRESSION:  This study showed evidence of epileptogenicity arising from left centro- parietal region with increased risk of seizure recurrence. Additionally there was moderate to severe diffuse encephalopathy. No definite seizures were noted.  Event button was pressed on 04/05/2024 at 0858 for right hand jerking without concomitant EEG change. However, focal motor seizure may not be seen on scalp eeg. Clinical correlation is recommended   Mauria Asquith O Apple Dearmas

## 2024-04-06 ENCOUNTER — Inpatient Hospital Stay (HOSPITAL_COMMUNITY)

## 2024-04-06 DIAGNOSIS — R569 Unspecified convulsions: Secondary | ICD-10-CM | POA: Diagnosis not present

## 2024-04-06 DIAGNOSIS — G9340 Encephalopathy, unspecified: Secondary | ICD-10-CM | POA: Diagnosis not present

## 2024-04-06 DIAGNOSIS — J9601 Acute respiratory failure with hypoxia: Secondary | ICD-10-CM | POA: Diagnosis not present

## 2024-04-06 DIAGNOSIS — R4020 Unspecified coma: Secondary | ICD-10-CM | POA: Diagnosis not present

## 2024-04-06 DIAGNOSIS — D6861 Antiphospholipid syndrome: Secondary | ICD-10-CM | POA: Diagnosis not present

## 2024-04-06 DIAGNOSIS — A419 Sepsis, unspecified organism: Secondary | ICD-10-CM | POA: Diagnosis not present

## 2024-04-06 LAB — GLUCOSE, CAPILLARY
Glucose-Capillary: 138 mg/dL — ABNORMAL HIGH (ref 70–99)
Glucose-Capillary: 144 mg/dL — ABNORMAL HIGH (ref 70–99)
Glucose-Capillary: 132 mg/dL — ABNORMAL HIGH (ref 70–99)
Glucose-Capillary: 132 mg/dL — ABNORMAL HIGH (ref 70–99)
Glucose-Capillary: 139 mg/dL — ABNORMAL HIGH (ref 70–99)
Glucose-Capillary: 157 mg/dL — ABNORMAL HIGH (ref 70–99)

## 2024-04-06 MED ORDER — MIDAZOLAM HCL 2 MG/2ML IJ SOLN
1.0000 mg | INTRAMUSCULAR | Status: DC | PRN
  Filled 2024-04-06: qty 2

## 2024-04-06 MED ORDER — FUROSEMIDE 10 MG/ML IJ SOLN
40.0000 mg | Freq: Once | INTRAMUSCULAR | Status: AC
  Administered 2024-04-06: 40 mg via INTRAVENOUS
  Filled 2024-04-06: qty 4

## 2024-04-06 MED ORDER — PERAMPANEL 2 MG PO TABS
4.0000 mg | ORAL_TABLET | ORAL | Status: AC
  Administered 2024-04-06: 4 mg
  Filled 2024-04-06: qty 2

## 2024-04-06 MED ORDER — PERAMPANEL 2 MG PO TABS
2.0000 mg | ORAL_TABLET | Freq: Every day | ORAL | Status: DC
  Administered 2024-04-07 – 2024-04-10 (×4): 2 mg
  Filled 2024-04-06 (×4): qty 1

## 2024-04-06 NOTE — Procedures (Signed)
 Patient Name: Alison Lopez  MRN: [id number]  Epilepsy Attending: Arleene Lack  Referring Physician/Provider: Ronnette Coke, MD  Duration: 04/05/2024 1759 to 04/06/2024 1759   Patient history: 68 y.o. female with a history of seizure who is undergoing an EEG to evaluate for seizures.   Level of alertness:  comatose   AEDs during EEG study: LEV,  LCM, Perampanel    Technical aspects: This EEG study was done with scalp electrodes positioned according to the 10-20 International system of electrode placement. Electrical activity was reviewed with band pass filter of 1-70Hz , sensitivity of 7 uV/mm, display speed of 55mm/sec with a 60Hz  notched filter applied as appropriate. EEG data were recorded continuously and digitally stored.  Video monitoring was available and reviewed as appropriate.   Description: EEG showed continuous generalized 3-6hz  theta-delta slowing admixed with 13-15hz  beta activity in fronto-central region.  Lateralized periodic discharges were noted in left hemisphere, maximal centro-parietal region every 3 seconds, at times with overriding fast activity. Hyperventilation and photic stimulation were not performed.      Four seizures without clinical signs were noted. During seizure, EEG showed 3-5hz  theta-delta slowing in left centro-parietal region which the evolved into 6-9hz  theta-alpha activity and appeared sharply contoured. Seizures were noted on 04/05/2024 at 2102 and on 04/06/2024 at 0051, 0211, 0642. Average duration of seizures was about 1 minute 30 seconds.   ABNORMALITY - Seizures without clinical signs, left centro-parietal region. - Lateralized periodic discharges, left hemisphere, left centro-parietal region - Continuous slow, generalized   IMPRESSION:  This study showed four seizures without clinical signs arising from left centro-parietal region. Seizures were noted on 04/05/2024 at 2102 and on 04/06/2024 at 0051, 0211, 0642. Average duration of seizures was  about 1 minute 30 seconds.   EEG also showed evidence of epileptogenicity arising from left centro- parietal region with increased risk of seizure recurrence. Additionally there was moderate to severe diffuse encephalopathy.     Francena Zender O Lucielle Vokes

## 2024-04-06 NOTE — Progress Notes (Signed)
 Pt placed on PS/CPAP 5/5 on 40% and is tolerating well. RT will monitor.

## 2024-04-06 NOTE — Progress Notes (Signed)
 NAME:  Alison Lopez, MRN:  [id number], DOB:  Aug 04, 1956, LOS: 7 ADMISSION DATE:  03/30/2024, CONSULTATION DATE: 5/22 REFERRING MD:  Dr Gladstone Lamer, CHIEF COMPLAINT: Seizure  History of Present Illness:  Patient is encephalopathic and/or intubated; therefore, history has been obtained from chart review.  68 year old female with past medical history as below, which is significant for COPD, dementia, diabetes, DVT, antiphospholipid syndrome and DVT on Eliquis , hypertension, lupus, hypothyroidism, and recent admission for subdural hematoma in April 2025 with bur hole evacuation.  Residual right-sided weakness.  She was discharged from SNF about 1 week prior to her presentation 5/21 with chief complaint of new onset seizure and altered mental status.  Seizures were indeed witnessed in the emergency department and she was given Ativan and Keppra.  She continued to have seizures despite treatment and was given additional Ativan and Keppra.  She was also started on ceftriaxone  for UTI.  Seizures were focal in nature with shoulder and facial twitching.  CT of the head showed decreasing subdural size.  MRI MRV showed multiple scattered foci of small infarcts.  She was transferred from [facility] to [facility] for continuous EEG monitoring.  She continued to seize even despite Vimpat load and was transferred to ICU for phenobarbital administration and concern for airway.  PCCM is consulted.  Pertinent  Medical History   has a past medical history of Anxiety, Arthritis, Collagen vascular disease (HCC), COPD (chronic obstructive pulmonary disease) (HCC), DDD (degenerative disc disease), lumbar, Dementia (HCC), Depression, Diabetes mellitus without complication (HCC), DVT (deep venous thrombosis) (HCC), Fibromyalgia, GERD (gastroesophageal reflux disease), Graves' disease with exophthalmos, HLD (hyperlipidemia), Hypertension, Hypothyroidism, Lupus, and Osteoporosis.   Significant Hospital Events: Including procedures,  antibiotic start and stop dates in addition to other pertinent events   5/22 transferred to ICU for seizure and worsening mental status, intubated for airway protection, required pressors and line placement, L subclavian placed arterially, vascular consulted and removed, subclavian stents placed  5/23 Stable overnight, remains on low dose norepi. Had an episode of L sided shaking, seen by neuro, low suspicion sz and remains on LTM 5/24 remains off continuous sedation this a.m. and tolerating SBT but mentation remains very poor 5/26 tol SBT, only responding to pain.  5/28 LTM remains in place with recurrent seizures seen prompting initiation of perampanel   Interim History / Subjective:  Opens eyes to physical and verbal stimuli   Objective    Blood pressure (!) 117/54, pulse 97, temperature 99.4 F (37.4 C), temperature source Axillary, resp. rate 15, weight 52.4 kg, SpO2 99%.    Vent Mode: PRVC FiO2 (%):  [40 %] 40 % Set Rate:  [18 bmp] 18 bmp Vt Set:  [310 mL] 310 mL PEEP:  [5 cmH20] 5 cmH20 Pressure Support:  [5 cmH20] 5 cmH20 Plateau Pressure:  [6 cmH20-13 cmH20] 6 cmH20   Intake/Output Summary (Last 24 hours) at 04/06/2024 0704 Last data filed at 04/06/2024 0600 Gross per 24 hour  Intake 1015.24 ml  Output 1985 ml  Net -969.76 ml   Filed Weights   04/04/24 0454 04/05/24 0211 04/06/24 0500  Weight: 53.4 kg 51.7 kg 52.4 kg    Physical Exam: General: Acute on chronically ill appearing severely deconditioned middle aged female lying in bed on mechanical ventilation, in NAD HEENT: ETT, MM pink/moist, PERRL, swollen protruding tongue  Neuro: Opens eyes to verbal and physical stimuli, withdrawal to pain  CV: s1s2 regular rate and rhythm, no murmur, rubs, or gallops,  PULM:  Clear to auscultation,  no increased work of breathing, no added breath sounds  GI: soft, bowel sounds active in all 4 quadrants, non-tender, non-distended, tolerating TF Extremities: warm/dry, no edema   Skin: no rashes or lesions  Resolved problem list  Sepsis secondary to acute cystitis: -Urine culture obtained at [facility] with 30,000 colonies of E. 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 Head of bed elevated 30 degrees. Plateau pressures less than 30 cm H20.  Follow intermittent chest x-ray and ABG.   SAT/SBT as tolerated, mentation preclude extubation  Ensure adequate pulmonary hygiene  Follow cultures  VAP bundle in place  PAD protocol  Antiphospholipid syndrome  Chronic Thrombocytopenia  -with history of DVT on chronic Eliquis  -Low-dose Home Eliquis  resumed 5/23 but unable to tolerate full dose which still puts her at risk for further clotting episodes.  P: Trend CBC  Continue reduced dose Eliquis  and ASA  Hypothyroid P: Synthroid    Malpositioned L subclavian CVC -removed by vascular surgery 5/22 -Per vascular given subclavian stenting patient would benefit from low-dose aspirin to maintain stent patency moving forward P: Low dose ASA  Swollen tongue  -Felt secondary to trauma during seizure activity  P: Supportive care   GOC  Poor prognosis given difficult to control seizure, PMT following with  tentative plan for goals of care meeting planned 5/30  Best Practice (right click and "Reselect all SmartList Selections" daily)   Diet/type: tubefeeds DVT prophylaxis SCD Pressure ulcer(s): pressure ulcer assessment deferred  GI prophylaxis: N/A Lines: Central line Foley:  N/A Code Status:  full code Last date of multidisciplinary goals of care discussion Psychologist, educational updated by phone 5/25. She has not come to the hospital yet. ]  Critical care time:   CRITICAL CARE Performed by: Jaquayla Hege D. Harris   Total critical care time: 39 minutes  Critical care time was exclusive of separately billable procedures and treating other patients.  Critical care was necessary to treat or prevent imminent or life-threatening deterioration.  Critical care was time spent personally by me on the following activities: development of treatment plan with patient and/or surrogate as well as nursing, discussions with consultants, evaluation of patient's response to treatment, examination of patient, obtaining history from patient or surrogate, ordering and performing treatments and interventions, ordering and review of laboratory studies, ordering and review of radiographic studies, pulse oximetry and re-evaluation of patient's condition.  Cyan Moultrie D. Harris, NP-C [facility] Personal contact information can be found on Amion  If no contact or response made please call [phone] 04/06/2024, 10:22 AM

## 2024-04-06 NOTE — Progress Notes (Signed)
 Subjective: Had 4 subclinical seizures overnight.  Continues to have intermittent right upper extremity twitching as well.  ROS: Unable to obtain due to poor mental status  Examination  Vital signs in last 24 hours: Temp:  [98.6 F (37 C)-100 F (37.8 C)] 99.4 F (37.4 C) (05/28 0800) Pulse Rate:  [80-105] 99 (05/28 1000) Resp:  [14-30] 20 (05/28 1000) BP: (90-144)/(54-91) 144/70 (05/28 1000) SpO2:  [98 %-100 %] 100 % (05/28 1046) FiO2 (%):  [40 %] 40 % (05/28 1046) Weight:  [52.4 kg] 52.4 kg (05/28 0500)  General: lying in bed, intubated, tongue swollen and protruding Neuro: Opens eyes, does not track examiner, does not follow any commands, pupils reacting to light, no forced gaze deviation,  does withdraw to noxious stimuli in all 4 extremities, had brief twitching in right hand after stimulation  Basic Metabolic Panel: Recent Labs  Lab 03/31/24 0536 03/31/24 0549 03/31/24 0919 04/01/24 0452 04/02/24 0458 04/02/24 1010 04/02/24 1611 04/03/24 0517 04/03/24 1755 04/04/24 0533  NA 141   < > 144 139 143  --   --  140  --  141  K 3.5   < > 3.1* 3.9 3.6  --   --  3.7  --  4.2  CL 116*  --   --  111 107  --   --  111  --  111  CO2 19*  --   --  22 23  --   --  24  --  23  GLUCOSE 113*  --   --  121* 102*  --   --  128*  --  181*  BUN <5*  --   --  <5* 7*  --   --  15  --  19  CREATININE 0.72  --   --  0.70 0.71  --   --  0.64  --  0.56  CALCIUM  8.0*  --   --  8.0* 8.2*  --   --  7.7*  --  8.0*  MG  --   --   --  2.2 1.9 1.8 2.5* 2.2 1.9 1.8  PHOS  --   --   --  3.3  --  3.3 3.1 2.7 2.3*  --    < > = values in this interval not displayed.    CBC: Recent Labs  Lab 03/31/24 0112 03/31/24 0536 03/31/24 0549 03/31/24 0919 04/01/24 0452 04/02/24 0458 04/03/24 0517 04/04/24 0533  WBC 18.0* 9.9  --   --  12.0* 10.2 7.6 7.0  NEUTROABS 14.7*  --   --   --  9.4* 7.8* 6.0 5.5  HGB 9.4* 10.2*   < > 8.5* 8.9* 8.5* 7.8* 8.7*  HCT 28.0* 31.5*   < > 25.0* 27.4* 26.3* 24.5*  27.7*  MCV 84.8 86.5  --   --  87.8 87.4 87.8 88.8  PLT 97* 60*  --   --  78* 67* 55* 61*   < > = values in this interval not displayed.     Coagulation Studies: No results for input(s): "LABPROT", "INR" in the last 72 hours.  Imaging No new brain imaging  ASSESSMENT AND PLAN:68 y.o. female presenting w/ focal status in the setting of UTI.  Status epilepticus has resolved.  However patient continues to be comatose   Convulsive status epilepticus, resolved Epilepsy with breakthrough seizure Acute encephalopathy due to seizures - Brief right hand twitching is likely focal motor seizure   Recommendations - Unfortunately, patient seizures recur.  Therefore we will give perampanel 4 mg once and start 2 mg nightly maintenance from tomorrow -Continue Keppra 750 mg twice daily and Vimpat 100 mg twice daily - Continue EEG monitoring to look for intermittent seizures -Continue amantadine 100 mg twice daily - Palliative care consult as patient has poor neurocognitive reserve and now prolonged seizures - Continue seizure precautions - As needed IV benzodiazepine for clinical seizures - Discussed plan with ICU and palliative care team   I have spent a total of 36 minutes with the patient reviewing hospital notes,  test results, labs and examining the patient as well as establishing an assessment and plan.  > 50% of time was spent in direct patient care.      Roxy Cordial Epilepsy [facility] For questions after 5pm please refer to AMION to reach the Neurologist on call

## 2024-04-06 NOTE — Plan of Care (Signed)
  Problem: Nutrition: Goal: Adequate nutrition will be maintained Outcome: Progressing   Problem: Elimination: Goal: Will not experience complications related to bowel motility Outcome: Progressing Goal: Will not experience complications related to urinary retention Outcome: Progressing   Problem: Pain Managment: Goal: General experience of comfort will improve and/or be controlled Outcome: Progressing   Problem: Safety: Goal: Ability to remain free from injury will improve Outcome: Progressing   Problem: Skin Integrity: Goal: Risk for impaired skin integrity will decrease Outcome: Progressing

## 2024-04-06 NOTE — TOC Initial Note (Signed)
 Transition of Care [facility]) - Initial/Assessment Note    Patient Details  Name: Alison Lopez MRN: [id number] Date of Birth: 1956/08/26  Transition of Care [facility]) CM/SW Contact:    Omie Bickers, RN Phone Number: 04/06/2024, 10:14 AM  Clinical Narrative:                  Patient admitted from home for status epilepticus. Currently intubated, comatose.  GOC Friday with palliative and family.  TOC available to assist in transition planning. Will await GOC meeting.    Expected Discharge Plan:  (TBD) Barriers to Discharge: Continued Medical Work up   Patient Goals and CMS Choice Patient states their goals for this hospitalization and ongoing recovery are:: patient intubated          Expected Discharge Plan and Services   Discharge Planning Services: CM Consult   Living arrangements for the past 2 months: Single Family Home                                      Prior Living Arrangements/Services Living arrangements for the past 2 months: Single Family Home Lives with:: Adult Children                   Activities of Daily Living      Permission Sought/Granted                  Emotional Assessment              Admission diagnosis:  Other abnormal involuntary movements [R25.8] Unspecified convulsions [R56.9] Hypokalemia [E87.6] Seizure (HCC) [R56.9] Patient Active Problem List   Diagnosis Date Noted   Sepsis due to gram-negative UTI (HCC) 03/29/2024   Partial seizure (HCC) 03/29/2024   Seizure (HCC) 03/29/2024   Subdural hematoma (HCC) 02/16/2024   Acute encephalopathy 02/16/2024   Acute anemia 02/16/2024   Acquired hypothyroidism 02/16/2024   SDH (subdural hematoma) (HCC) 02/08/2024   Bilateral subdural hematomas (HCC) 01/29/2024   Compression of brain (HCC) 01/29/2024   Dementia without behavioral disturbance (HCC) 01/29/2024   Antiphospholipid antibody syndrome (HCC) 01/21/2024   Thrombocytopenia (HCC) 01/21/2024   B12 deficiency  01/21/2024   Neovascular glaucoma of right eye 11/28/2019   COPD (chronic obstructive pulmonary disease) (HCC) 06/28/2019   Depression 06/28/2019   Fibromyalgia 06/28/2019   GERD (gastroesophageal reflux disease) 06/28/2019   HLD (hyperlipidemia) 06/28/2019   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 06/28/2019   Rheumatoid arthritis (HCC) 06/28/2019   Diabetes (HCC) 05/17/2019   Essential hypertension 05/17/2019   DVT (deep venous thrombosis) (HCC) 04/21/2019   Right leg pain 03/21/2019   Compression fracture of L2 (HCC) 01/28/2017   Osteoporosis of multiple sites 01/28/2017   Pain in joint, pelvic region and thigh 01/28/2017   Abnormal intentional weight loss 12/16/2016   DDD (degenerative disc disease), lumbar 12/16/2016   Generalized osteoarthritis of hand 12/16/2016   Anticoagulant long-term use 01/31/2015   Episode of dizziness 01/25/2015   History of DVT of lower extremity 01/25/2015   DM2 (diabetes mellitus, type 2) (HCC) 01/25/2015   PCP:  Alexander Anes, MD Pharmacy:   [facility] - [place], [place] - [address] [place] [address] Phone: [phone] Fax: [phone]  [facility] [address] [place] [address] Phone: [phone] Fax: [phone]     Social Drivers of Health (SDOH) Social  History: SDOH Screenings   Food Insecurity: Patient Unable To Answer (04/01/2024)  Housing: Patient Unable To Answer (04/01/2024)  Transportation Needs: Patient Unable To Answer (04/01/2024)  Utilities: Patient Unable To Answer (04/01/2024)  Depression (PHQ2-9): High Risk (03/15/2024)  Social Connections: Patient Unable To Answer (04/01/2024)  Recent Concern: Social Connections - Socially Isolated (02/16/2024)  Tobacco Use: Medium Risk (03/28/2024)   SDOH Interventions:     Readmission Risk Interventions     No data to display

## 2024-04-06 NOTE — Progress Notes (Signed)
 vLTM maintenance   All impedances below 10k  No skin breakdown noted at [facility]

## 2024-04-07 ENCOUNTER — Inpatient Hospital Stay (HOSPITAL_COMMUNITY)

## 2024-04-07 DIAGNOSIS — G9340 Encephalopathy, unspecified: Secondary | ICD-10-CM | POA: Diagnosis not present

## 2024-04-07 DIAGNOSIS — J9601 Acute respiratory failure with hypoxia: Secondary | ICD-10-CM | POA: Diagnosis not present

## 2024-04-07 DIAGNOSIS — R4020 Unspecified coma: Secondary | ICD-10-CM | POA: Diagnosis not present

## 2024-04-07 DIAGNOSIS — R569 Unspecified convulsions: Secondary | ICD-10-CM | POA: Diagnosis not present

## 2024-04-07 DIAGNOSIS — A419 Sepsis, unspecified organism: Secondary | ICD-10-CM | POA: Diagnosis not present

## 2024-04-07 DIAGNOSIS — N3000 Acute cystitis without hematuria: Secondary | ICD-10-CM | POA: Diagnosis not present

## 2024-04-07 LAB — GLUCOSE, CAPILLARY
Glucose-Capillary: 118 mg/dL — ABNORMAL HIGH (ref 70–99)
Glucose-Capillary: 135 mg/dL — ABNORMAL HIGH (ref 70–99)
Glucose-Capillary: 141 mg/dL — ABNORMAL HIGH (ref 70–99)
Glucose-Capillary: 144 mg/dL — ABNORMAL HIGH (ref 70–99)
Glucose-Capillary: 163 mg/dL — ABNORMAL HIGH (ref 70–99)
Glucose-Capillary: 146 mg/dL — ABNORMAL HIGH (ref 70–99)

## 2024-04-07 LAB — BASIC METABOLIC PANEL WITH GFR
Anion gap: 6 (ref 5–15)
BUN: 26 mg/dL — ABNORMAL HIGH (ref 8–23)
CO2: 33 mmol/L — ABNORMAL HIGH (ref 22–32)
Calcium: 8.7 mg/dL — ABNORMAL LOW (ref 8.9–10.3)
Chloride: 96 mmol/L — ABNORMAL LOW (ref 98–111)
Creatinine, Ser: 0.63 mg/dL (ref 0.44–1.00)
GFR, Estimated: >60 mL/min (ref >60)
Glucose, Bld: 133 mg/dL — ABNORMAL HIGH (ref 70–99)
Potassium: 4.7 mmol/L (ref 3.5–5.1)
Sodium: 135 mmol/L (ref 135–145)

## 2024-04-07 LAB — CBC
HCT: 27.4 % — ABNORMAL LOW (ref 36.0–46.0)
Hemoglobin: 8.6 g/dL — ABNORMAL LOW (ref 12.0–15.0)
MCH: 27.1 pg (ref 26.0–34.0)
MCHC: 31.4 g/dL (ref 30.0–36.0)
MCV: 86.4 fL (ref 80.0–100.0)
Platelets: 110 10*3/uL — ABNORMAL LOW (ref 150–400)
RBC: 3.17 MIL/uL — ABNORMAL LOW (ref 3.87–5.11)
RDW: 14.7 % (ref 11.5–15.5)
WBC: 7.7 10*3/uL (ref 4.0–10.5)
nRBC: 0 % (ref 0.0–0.2)

## 2024-04-07 MED ORDER — FAMOTIDINE 20 MG PO TABS: 20.0000 mg | ORAL_TABLET | Freq: Every day | ORAL | Status: DC

## 2024-04-07 MED ORDER — FAMOTIDINE 20 MG PO TABS
20.0000 mg | ORAL_TABLET | Freq: Every day | ORAL | Status: DC
  Administered 2024-04-07 – 2024-04-10 (×4): 20 mg
  Filled 2024-04-07 (×4): qty 1

## 2024-04-07 NOTE — Procedures (Signed)
 Patient Name: MARIALICE NEWKIRK  MRN: [id number]  Epilepsy Attending: Arleene Lack  Referring Physician/Provider: Ronnette Coke, MD  Duration: 04/06/2024 1759 to 04/07/2024 1759   Patient history: 68 y.o. female with a history of seizure who is undergoing an EEG to evaluate for seizures.   Level of alertness:  lethargic   AEDs during EEG study: LEV,  LCM, Perampanel   Technical aspects: This EEG study was done with scalp electrodes positioned according to the 10-20 International system of electrode placement. Electrical activity was reviewed with band pass filter of 1-70Hz , sensitivity of 7 uV/mm, display speed of 29mm/sec with a 60Hz  notched filter applied as appropriate. EEG data were recorded continuously and digitally stored.  Video monitoring was available and reviewed as appropriate.   Description: EEG showed continuous generalized 3-6hz  theta-delta slowing admixed with 13-15hz  beta activity in fronto-central region.  Lateralized periodic discharges were noted in left hemisphere, maximal centro-parietal region every 3-4 seconds, at times with overriding fast activity. Hyperventilation and photic stimulation were not performed.       ABNORMALITY - Lateralized periodic discharges, left hemisphere, left centro-parietal region - Continuous slow, generalized   IMPRESSION:  This study showed evidence of epileptogenicity arising from left centro- parietal region with increased risk of seizure recurrence. Additionally there was moderate to severe diffuse encephalopathy. No definite seizures were noted.     Evelette Hollern O Marshall Kampf

## 2024-04-07 NOTE — Progress Notes (Signed)
EEG maint complete.  ?

## 2024-04-07 NOTE — Progress Notes (Signed)
 NAME:  Alison Lopez, MRN:  [id number], DOB:  1956/06/22, LOS: 8 ADMISSION DATE:  03/30/2024, CONSULTATION DATE: 5/22 REFERRING MD:  Dr Gladstone Lamer, CHIEF COMPLAINT: Seizure  History of Present Illness:  Patient is encephalopathic and/or intubated; therefore, history has been obtained from chart review.  68 year old female with past medical history as below, which is significant for COPD, dementia, diabetes, DVT, antiphospholipid syndrome and DVT on Eliquis , hypertension, lupus, hypothyroidism, and recent admission for subdural hematoma in April 2025 with bur hole evacuation.  Residual right-sided weakness.  She was discharged from SNF about 1 week prior to her presentation 5/21 with chief complaint of new onset seizure and altered mental status.  Seizures were indeed witnessed in the emergency department and she was given Ativan  and Keppra .  She continued to have seizures despite treatment and was given additional Ativan  and Keppra .  She was also started on ceftriaxone  for UTI.  Seizures were focal in nature with shoulder and facial twitching.  CT of the head showed decreasing subdural size.  MRI MRV showed multiple scattered foci of small infarcts.  She was transferred from [facility] to [facility] for continuous EEG monitoring.  She continued to seize even despite Vimpat  load and was transferred to ICU for phenobarbital  administration and concern for airway.  PCCM is consulted.  Pertinent  Medical History   has a past medical history of Anxiety, Arthritis, Collagen vascular disease (HCC), COPD (chronic obstructive pulmonary disease) (HCC), DDD (degenerative disc disease), lumbar, Dementia (HCC), Depression, Diabetes mellitus without complication (HCC), DVT (deep venous thrombosis) (HCC), Fibromyalgia, GERD (gastroesophageal reflux disease), Graves' disease with exophthalmos, HLD (hyperlipidemia), Hypertension, Hypothyroidism, Lupus, and Osteoporosis.   Significant Hospital Events: Including procedures,  antibiotic start and stop dates in addition to other pertinent events   5/22 transferred to ICU for seizure and worsening mental status, intubated for airway protection, required pressors and line placement, L subclavian placed arterially, vascular consulted and removed, subclavian stents placed  5/23 Stable overnight, remains on low dose norepi. Had an episode of L sided shaking, seen by neuro, low suspicion sz and remains on LTM 5/24 remains off continuous sedation this a.m. and tolerating SBT but mentation remains very poor 5/26 tol SBT, only responding to pain.  5/28 LTM remains in place with recurrent seizures seen prompting initiation of perampanel   5/29 remains on LTM, opens eyes to verbal and physical stimuli, unable to follow commands.  Mentation continues to limit extubation  Interim History / Subjective:  Opens eyes to physical and verbal stimuli  Objective    Blood pressure 137/74, pulse (!) 109, temperature 98.7 F (37.1 C), temperature source Axillary, resp. rate (!) 26, weight 49.4 kg, SpO2 99%.    Vent Mode: PRVC FiO2 (%):  [40 %] 40 % Set Rate:  [18 bmp] 18 bmp Vt Set:  [310 mL] 310 mL PEEP:  [5 cmH20] 5 cmH20 Pressure Support:  [5 cmH20] 5 cmH20 Plateau Pressure:  [8 cmH20-9 cmH20] 9 cmH20   Intake/Output Summary (Last 24 hours) at 04/07/2024 0910 Last data filed at 04/07/2024 0800 Gross per 24 hour  Intake 1010.16 ml  Output 2090 ml  Net -1079.84 ml   Filed Weights   04/05/24 0211 04/06/24 0500 04/07/24 0500  Weight: 51.7 kg 52.4 kg 49.4 kg    Physical Exam: General: Acute on chronic ill-appearing frail lady middle-aged female lying in bed on mechanical ventilation in no acute distress HEENT: ETT, MM pink/moist, PERRL,  Neuro: Opens eyes to verbal and physical stimuli, unable to follow  commands CV: s1s2 regular rate and rhythm, no murmur, rubs, or gallops,  PULM: Clear to auscultation bilaterally, no increased work of breathing, no added breath sounds GI:  soft, bowel sounds active in all 4 quadrants, non-tender, non-distended, tolerating TF Extremities: warm/dry, no edema  Skin: no rashes or lesions  Resolved problem list  Sepsis secondary to acute cystitis: -Urine culture obtained at [facility] with 30,000 colonies of E. 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 Keppra, and Perampanel Seizure precautions Frequent neurochecks Ongoing goals of care Neuroprotective measures  Acute hypoxic respiratory failure Concern for aspiration P: Patient continues to preclude extubation, need to discuss with family goals of care including possible trach Continue ventilator support with lung protective strategies  Wean PEEP and FiO2 for sats greater than 90%. Head of bed elevated 30 degrees. Plateau pressures less than 30 cm H20.  Follow intermittent chest x-ray and ABG.   SAT/SBT as tolerated, mentation preclude extubation  Ensure adequate pulmonary hygiene  Follow cultures  VAP bundle in place  PAD protocol  Antiphospholipid syndrome  Chronic Thrombocytopenia  -with history of DVT on chronic Eliquis  -Low-dose Home Eliquis  resumed 5/23 but unable to tolerate full dose which still puts her at risk for further clotting episodes.  P: Trend CBC Continue reduced dose Eliquis  and ASA  Hypothyroid P: Continue home Synthroid   Malpositioned L subclavian CVC -removed by vascular surgery 5/22 -Per vascular given subclavian stenting patient would benefit from low-dose aspirin  to maintain stent patency moving forward P: Low-dose aspirin  Swollen tongue  -Felt secondary to trauma during seizure activity  P: Supportive care  GOC  Poor prognosis given difficult to control seizure, PMT following with tentative plan for goals of care meeting planned 5/30  Best Practice (right click and "Reselect all SmartList Selections" daily)   Diet/type: tubefeeds DVT prophylaxis SCD Pressure ulcer(s): pressure ulcer assessment deferred  GI prophylaxis: N/A Lines: Central line Foley:  N/A Code Status:  full code Last date of multidisciplinary goals of care discussion Psychologist, educational updated by phone 5/25. She has not come to the hospital yet. ]  Critical care time:   CRITICAL CARE Performed by: Genisis Sonnier D. Harris   Total critical care time: 38 minutes  Critical care time was exclusive of separately billable procedures and treating other patients.  Critical care was necessary to treat or prevent imminent or life-threatening deterioration.  Critical care was time spent personally by me on the following activities: development of treatment plan with patient and/or surrogate as well as nursing, discussions with consultants, evaluation of patient's response to treatment, examination of patient, obtaining history from patient or surrogate, ordering and performing treatments and interventions, ordering and review of laboratory studies, ordering and review of radiographic studies, pulse oximetry and re-evaluation of patient's condition.  Josealfredo Adkins D. Harris, NP-C [facility] Personal contact information can be found on Amion  If no contact or response made please call [phone] 04/07/2024, 9:10 AM

## 2024-04-07 NOTE — Progress Notes (Signed)
 LTM maint complete - no skin breakdown under:

## 2024-04-07 NOTE — Progress Notes (Signed)
 Subjective: No acute events overnight.  No further seizures since adding perampanel.  However appears to be more encephalopathic today, not opening eyes like yesterday.  ROS: Unable to obtain due to poor mental status  Examination  Vital signs in last 24 hours: Temp:  [98.7 F (37.1 C)-99.5 F (37.5 C)] 98.7 F (37.1 C) (05/29 0800) Pulse Rate:  [79-109] 96 (05/29 1000) Resp:  [12-29] 23 (05/29 1000) BP: (103-155)/(42-90) 118/55 (05/29 1000) SpO2:  [88 %-100 %] 98 % (05/29 1000) FiO2 (%):  [40 %] 40 % (05/29 0738) Weight:  [49.4 kg] 49.4 kg (05/29 0500)  General: lying in bed, intubated, tongue swollen and protruding Neuro: Does not open eyes to noxious stimulation, does not follow any commands, pupils reacting to light, no forced gaze deviation,  does withdraw to noxious stimuli in all 4 extremities with antigravity strength in bilateral upper extremities and right hemiparesis  Basic Metabolic Panel: Recent Labs  Lab 04/01/24 0452 04/02/24 0458 04/02/24 1010 04/02/24 1611 04/03/24 0517 04/03/24 1755 04/04/24 0533 04/07/24 0815  NA 139 143  --   --  140  --  141 135  K 3.9 3.6  --   --  3.7  --  4.2 4.7  CL 111 107  --   --  111  --  111 96*  CO2 22 23  --   --  24  --  23 33*  GLUCOSE 121* 102*  --   --  128*  --  181* 133*  BUN <5* 7*  --   --  15  --  19 26*  CREATININE 0.70 0.71  --   --  0.64  --  0.56 0.63  CALCIUM  8.0* 8.2*  --   --  7.7*  --  8.0* 8.7*  MG 2.2 1.9 1.8 2.5* 2.2 1.9 1.8  --   PHOS 3.3  --  3.3 3.1 2.7 2.3*  --   --     CBC: Recent Labs  Lab 04/01/24 0452 04/02/24 0458 04/03/24 0517 04/04/24 0533 04/07/24 0815  WBC 12.0* 10.2 7.6 7.0 7.7  NEUTROABS 9.4* 7.8* 6.0 5.5  --   HGB 8.9* 8.5* 7.8* 8.7* 8.6*  HCT 27.4* 26.3* 24.5* 27.7* 27.4*  MCV 87.8 87.4 87.8 88.8 86.4  PLT 78* 67* 55* 61* 110*     Coagulation Studies: No results for input(s): "LABPROT", "INR" in the last 72 hours.  Imaging No new brain imaging   ASSESSMENT AND  PLAN:68 y.o. female presenting w/ focal status in the setting of UTI.  Status epilepticus has resolved.  However patient continues to be comatose   Convulsive status epilepticus, resolved Epilepsy with breakthrough seizure Acute encephalopathy due to seizures - No seizures overnight.   Recommendations -Continue Keppra 750 mg twice daily, Vimpat 100 mg twice daily and perampanel 2 mg nightly - Continue EEG monitoring to look for intermittent seizures -Continue amantadine 100 mg twice daily - Appreciate palliative care consult as patient has poor neurocognitive reserve and now prolonged seizures.  Planning to meet daughter tomorrow for goals of care discussion - Continue seizure precautions - As needed IV benzodiazepine for clinical seizures - Discussed plan with ICU  team   I have spent a total of 26 minutes with the patient reviewing hospital notes,  test results, labs and examining the patient as well as establishing an assessment and plan.  > 50% of time was spent in direct patient care.    Roxy Cordial Epilepsy [facility] For questions after  5pm please refer to AMION to reach the Neurologist on call

## 2024-04-08 ENCOUNTER — Inpatient Hospital Stay (HOSPITAL_COMMUNITY)

## 2024-04-08 DIAGNOSIS — K140 Glossitis: Secondary | ICD-10-CM | POA: Diagnosis not present

## 2024-04-08 DIAGNOSIS — J9601 Acute respiratory failure with hypoxia: Secondary | ICD-10-CM | POA: Diagnosis not present

## 2024-04-08 DIAGNOSIS — R4020 Unspecified coma: Secondary | ICD-10-CM | POA: Diagnosis not present

## 2024-04-08 DIAGNOSIS — R569 Unspecified convulsions: Secondary | ICD-10-CM | POA: Diagnosis not present

## 2024-04-08 DIAGNOSIS — G9340 Encephalopathy, unspecified: Secondary | ICD-10-CM | POA: Diagnosis not present

## 2024-04-08 LAB — GLUCOSE, CAPILLARY
Glucose-Capillary: 152 mg/dL — ABNORMAL HIGH (ref 70–99)
Glucose-Capillary: 149 mg/dL — ABNORMAL HIGH (ref 70–99)
Glucose-Capillary: 115 mg/dL — ABNORMAL HIGH (ref 70–99)
Glucose-Capillary: 117 mg/dL — ABNORMAL HIGH (ref 70–99)
Glucose-Capillary: 156 mg/dL — ABNORMAL HIGH (ref 70–99)
Glucose-Capillary: 154 mg/dL — ABNORMAL HIGH (ref 70–99)

## 2024-04-08 MED ORDER — BANATROL TF EN LIQD
60.0000 mL | Freq: Two times a day (BID) | ENTERAL | Status: DC
  Administered 2024-04-08 – 2024-04-10 (×6): 60 mL
  Filled 2024-04-08 (×6): qty 60

## 2024-04-08 NOTE — Progress Notes (Signed)
 Subjective: NAEO. Continues to be comatose.  ROS: Unable to obtain due to poor mental status  Examination  Vital signs in last 24 hours: Temp:  [98.5 F (36.9 C)-99.4 F (37.4 C)] 99 F (37.2 C) (05/30 1200) Pulse Rate:  [84-109] 95 (05/30 1400) Resp:  [14-24] 21 (05/30 1400) BP: (93-124)/(39-63) 113/53 (05/30 1400) SpO2:  [94 %-100 %] 98 % (05/30 1400) FiO2 (%):  [40 %] 40 % (05/30 1128) Weight:  [48.3 kg] 48.3 kg (05/30 0500)  General: lying in bed, intubated, tongue swollen and protruding Neuro: Does not open eyes to noxious stimulation, does not follow any commands, pupils reacting to light, no forced gaze deviation,  does withdraw to noxious stimuli in all 4 extremities with antigravity strength in bilateral upper extremities and right hemiparesis  Basic Metabolic Panel: Recent Labs  Lab 04/02/24 0458 04/02/24 1010 04/02/24 1611 04/03/24 0517 04/03/24 1755 04/04/24 0533 04/07/24 0815  NA 143  --   --  140  --  141 135  K 3.6  --   --  3.7  --  4.2 4.7  CL 107  --   --  111  --  111 96*  CO2 23  --   --  24  --  23 33*  GLUCOSE 102*  --   --  128*  --  181* 133*  BUN 7*  --   --  15  --  19 26*  CREATININE 0.71  --   --  0.64  --  0.56 0.63  CALCIUM  8.2*  --   --  7.7*  --  8.0* 8.7*  MG 1.9 1.8 2.5* 2.2 1.9 1.8  --   PHOS  --  3.3 3.1 2.7 2.3*  --   --     CBC: Recent Labs  Lab 04/02/24 0458 04/03/24 0517 04/04/24 0533 04/07/24 0815  WBC 10.2 7.6 7.0 7.7  NEUTROABS 7.8* 6.0 5.5  --   HGB 8.5* 7.8* 8.7* 8.6*  HCT 26.3* 24.5* 27.7* 27.4*  MCV 87.4 87.8 88.8 86.4  PLT 67* 55* 61* 110*     Coagulation Studies: No results for input(s): "LABPROT", "INR" in the last 72 hours.  Imaging No new brain imaging   ASSESSMENT AND PLAN:68 y.o. female presenting w/ focal status in the setting of UTI.  Status epilepticus has resolved.  However patient continues to be comatose   Convulsive status epilepticus, resolved Epilepsy with breakthrough seizure Acute  encephalopathy due to seizures - No seizures overnight.   Recommendations -Continue Keppra 750 mg twice daily, Vimpat 100 mg twice daily and perampanel 2 mg nightly - Continue EEG monitoring to look for intermittent seizures -Continue amantadine 100 mg twice daily - Appreciate palliative care consult as patient has poor neurocognitive reserve and now prolonged seizures.  Planning to meet daughter tomorrow for goals of care discussion - Continue seizure precautions - As needed IV benzodiazepine for clinical seizures - Discussed plan with ICU  team   I have spent a total of 27 minutes with the patient reviewing hospital notes,  test results, labs and examining the patient as well as establishing an assessment and plan.  > 50% of time was spent in direct patient care.      Alison Lopez Epilepsy [facility] For questions after 5pm please refer to AMION to reach the Neurologist on call

## 2024-04-08 NOTE — TOC Progression Note (Signed)
 Transition of Care [facility]) - Progression Note    Patient Details  Name: Alison Lopez MRN: [id number] Date of Birth: 1956/11/01  [facility]) CM/SW Contact  Jannice Mends, LCSW Phone Number: 04/08/2024, 9:34 AM  Clinical Narrative:    TOC continuing to follow.    Expected Discharge Plan:  (TBD) Barriers to Discharge: Continued Medical Work up  Expected Discharge Plan and Services   Discharge Planning Services: CM Consult   Living arrangements for the past 2 months: Single Family Home                                       Social Determinants of Health (SDOH) Interventions SDOH Screenings   Food Insecurity: Patient Unable To Answer (04/01/2024)  Housing: Patient Unable To Answer (04/01/2024)  Transportation Needs: Patient Unable To Answer (04/01/2024)  Utilities: Patient Unable To Answer (04/01/2024)  Depression (PHQ2-9): High Risk (03/15/2024)  Social Connections: Patient Unable To Answer (04/01/2024)  Recent Concern: Social Connections - Socially Isolated (02/16/2024)  Tobacco Use: Medium Risk (03/28/2024)    Readmission Risk Interventions     No data to display

## 2024-04-08 NOTE — Procedures (Addendum)
 Patient Name: Alison Lopez  MRN: [id number]  Epilepsy Attending: Arleene Lack  Referring Physician/Provider: Ronnette Coke, MD  Duration: 04/07/2024 1759 to 04/08/2024 1759   Patient history: 68 y.o. female with a history of seizure who is undergoing an EEG to evaluate for seizures.   Level of alertness:  lethargic   AEDs during EEG study: LEV,  LCM, Perampanel    Technical aspects: This EEG study was done with scalp electrodes positioned according to the 10-20 International system of electrode placement. Electrical activity was reviewed with band pass filter of 1-70Hz , sensitivity of 7 uV/mm, display speed of 29mm/sec with a 60Hz  notched filter applied as appropriate. EEG data were recorded continuously and digitally stored.  Video monitoring was available and reviewed as appropriate.   Description: EEG showed continuous generalized 3-6hz  theta-delta slowing admixed with 13-15hz  beta activity in fronto-central region.  Lateralized periodic discharges were noted in left hemisphere, maximal centro-parietal region every2-3 seconds, at times with overriding fast activity. Hyperventilation and photic stimulation were not performed.       ABNORMALITY - Lateralized periodic discharges, left hemisphere, left centro-parietal region - Continuous slow, generalized   IMPRESSION:  This study showed evidence of epileptogenicity arising from left centro- parietal region with increased risk of seizure recurrence. Additionally there was moderate to severe diffuse encephalopathy. No definite seizures were noted.     Necole Minassian O Tunya Held

## 2024-04-08 NOTE — Progress Notes (Signed)
 Nutrition Follow-up  DOCUMENTATION CODES:   Not applicable  INTERVENTION:   Tube feeding via OG tube: Osmolite 1.5 at 40 ml/h (960 ml per day)  Prosource TF20 60 ml daily  Provides 1520 kcal, 80 gm protein, 729 ml free water daily  100 mg thiamine daily x 7 days  MVI with minerals daily   Add Banatrol TF BID  NUTRITION DIAGNOSIS:   Inadequate oral intake related to inability to eat as evidenced by NPO status. Ongoing.   GOAL:   Patient will meet greater than or equal to 90% of their needs Met with TF at goal rate  MONITOR:   TF tolerance  REASON FOR ASSESSMENT:   Consult Enteral/tube feeding initiation and management  ASSESSMENT:   Pt with PMH of COPD, dementia, DM, HTN, lupus, DVT and antiphospholipid syndrome, and recent admission in 02/2024 for SDH s/p bur hole evacuation with residual R-sided weakness now admitted from SNF with new onset seizure and AMS.   Pt discussed during ICU rounds and with RN and MD.  Pt remains intubated and having seizures, poor prognosis per MD. Plan for palliative care meeting 5/30.   5/22 - intubated 5/24 - TF protocol initiated  Medications reviewed and include: pepcid , SSI every 4 hours, keppra, synthroid , MVI with minerals, miralax , thiamine   Labs reviewed:  A1C 4.5 CBG's: 146-163   18 F OG tube; gastric per CT   Diet Order:   Diet Order             Diet NPO time specified  Diet effective now                   EDUCATION NEEDS:   Not appropriate for education at this time  Skin:  Skin Assessment: Reviewed RN Assessment  Last BM:  5/30 type 7  Height:   Ht Readings from Last 1 Encounters:  03/28/24 4\' 9"  (1.448 m)    Weight:   Wt Readings from Last 1 Encounters:  04/08/24 48.3 kg    BMI:  Body mass index is 23.04 kg/m.  Estimated Nutritional Needs:   Kcal:  1300-1500  Protein:  65-75 grams  Fluid:  >1.5 L/day  Randine Butcher., RD, LDN, CNSC See AMiON for contact information

## 2024-04-08 NOTE — Progress Notes (Signed)
 Spoke with daughter on the phone. Was very candid about the difficult spot we are in. She has been on life support >1 week, has not been on continuous sedation but remains minimally responsive. High AED needs to manage seizures.   I worry greatly that her QOL outcome is likely poor. We talked about what trach/PEG/facility would look like vs briefly discussing a comfort focussed path.   She was under the impression that he mom was doing well and had great awareness when she visited earlier this week. She was saddened to hear that in actuality her neuro status has actually remained quite poor.   I encouraged her to think about QOL goals. Notified her that PMT will reach out later today to discuss further, she is appreciative of this     Eston Hence MSN, AGACNP-BC [facility] 04/08/2024, 2:12 PM

## 2024-04-08 NOTE — Progress Notes (Signed)
 NAME:  Alison Lopez, MRN:  [id number], DOB:  [birth year], LOS: 9 ADMISSION DATE:  03/30/2024, CONSULTATION DATE: 5/22 REFERRING MD:  Dr Gladstone Lamer, CHIEF COMPLAINT: Seizure  History of Present Illness:  Patient is encephalopathic and/or intubated; therefore, history has been obtained from chart review.  68 year old female with past medical history as below, which is significant for COPD, dementia, diabetes, DVT, antiphospholipid syndrome and DVT on Eliquis , hypertension, lupus, hypothyroidism, and recent admission for subdural hematoma in April 2025 with bur hole evacuation.  Residual right-sided weakness.  She was discharged from SNF about 1 week prior to her presentation 5/21 with chief complaint of new onset seizure and altered mental status.  Seizures were indeed witnessed in the emergency department and she was given Ativan and Keppra.  She continued to have seizures despite treatment and was given additional Ativan and Keppra.  She was also started on ceftriaxone  for UTI.  Seizures were focal in nature with shoulder and facial twitching.  CT of the head showed decreasing subdural size.  MRI MRV showed multiple scattered foci of small infarcts.  She was transferred from [facility] to [facility] for continuous EEG monitoring.  She continued to seize even despite Vimpat load and was transferred to ICU for phenobarbital administration and concern for airway.  PCCM is consulted.  Pertinent  Medical History   has a past medical history of Anxiety, Arthritis, Collagen vascular disease (HCC), COPD (chronic obstructive pulmonary disease) (HCC), DDD (degenerative disc disease), lumbar, Dementia (HCC), Depression, Diabetes mellitus without complication (HCC), DVT (deep venous thrombosis) (HCC), Fibromyalgia, GERD (gastroesophageal reflux disease), Graves' disease with exophthalmos, HLD (hyperlipidemia), Hypertension, Hypothyroidism, Lupus, and Osteoporosis.   Significant Hospital Events: Including procedures,  antibiotic start and stop dates in addition to other pertinent events   5/22 transferred to ICU for seizure and worsening mental status, intubated for airway protection, required pressors and line placement, L subclavian placed arterially, vascular consulted and removed, subclavian stents placed  5/23 Stable overnight, remains on low dose norepi. Had an episode of L sided shaking, seen by neuro, low suspicion sz and remains on LTM 5/24 remains off continuous sedation this a.m. and tolerating SBT but mentation remains very poor 5/26 tol SBT, only responding to pain.  5/28 LTM remains in place with recurrent seizures seen prompting initiation of perampanel  5/29 remains on LTM, opens eyes to verbal and physical stimuli, unable to follow commands.  Mentation continues to limit extubation 5/30 intended for PMT mtg   Interim History / Subjective:  NAEO     Objective    Blood pressure (!) 121/53, pulse 91, temperature 98.6 F (37 C), temperature source Axillary, resp. rate 19, weight 48.3 kg, SpO2 96%.    Vent Mode: PRVC FiO2 (%):  [40 %] 40 % Set Rate:  [18 bmp] 18 bmp Vt Set:  [310 mL] 310 mL PEEP:  [5 cmH20] 5 cmH20 Pressure Support:  [5 cmH20] 5 cmH20 Plateau Pressure:  [15 cmH20] 15 cmH20   Intake/Output Summary (Last 24 hours) at 04/08/2024 1143 Last data filed at 04/08/2024 0800 Gross per 24 hour  Intake 1048.03 ml  Output 910 ml  Net 138.03 ml   Filed Weights   04/06/24 0500 04/07/24 0500 04/08/24 0500  Weight: 52.4 kg 49.4 kg 48.3 kg    Physical Exam: General: Chronically and critically ill frail older adult F HEENT: ETT secure protuberant tongue EEG  in place.   Neuro: Does not follow commands. Non purposeful movement LUE to pain.  CV: rr cap  refill < 3 sec  PULM: mechanically ventilated, even unlabored on PSV  GI: soft ndnt  Extremities: no acute joint deformity  Skin: friable appearing skin. C/d/w  Resolved problem list  Sepsis secondary to acute  cystitis: -Urine culture obtained at [facility] with 30,000 colonies of E. Coli resistant to Cipro and nitrofurantoin Aspiration pneumonia concern -Completed 5 days ceftriaxone    Assessment and Plan   Focal SE with post ictal encephalopathy  Multifocal acute infarcts Hx Spontaneous SDH (01/2024) P: -vimpat  keppra  perampanel  -cEEG  -tough spot where we aren't really waking up on the AED reg needed to control her sz. Think we really need to figure out GOC.   Acute hypoxic respiratory failure Concern for aspiration Tongue edema  P: -WUA/SBT  -mentation hard limitation for extubation, also would worry about tongue edema / possible physical airway compromise -if GOC yields path of ongoing aggressive care, I would discuss trach   Antiphospholipid syndrome  Chronic Thrombocytopenia  -with history of DVT on chronic Eliquis  -Low-dose Home Eliquis  resumed 5/23 but unable to tolerate full dose which still puts her at risk for further clotting episodes.  P: -ASA, eliquis  -- reduced dose  Hypothyroidism  P: -synthroid    Malpositioned L subclavian CVC -removed by vascular surgery 5/22 -Per vascular given subclavian stenting patient would benefit from low-dose aspirin  to maintain stent patency moving forward P: -ASA  GOC  -Poor prognosis given difficult to control seizure, PMT following with tentative plan for goals of care meeting planned 5/30  Best Practice (right click and "Reselect all SmartList Selections" daily)   Diet/type: tubefeeds DVT prophylaxis SCD Pressure ulcer(s): pressure ulcer assessment deferred  GI prophylaxis: N/A Lines: Central line Foley:  N/A Code Status:  full code Last date of multidisciplinary goals of care discussion Psychologist, educational updated by phone 5/25. She has not come to the hospital yet. ]  Critical care time:     CRITICAL CARE Performed by: Delories Fetter   Total critical care time: 38 minutes  Critical care time was exclusive of separately  billable procedures and treating other patients. Critical care was necessary to treat or prevent imminent or life-threatening deterioration.  Critical care was time spent personally by me on the following activities: development of treatment plan with patient and/or surrogate as well as nursing, discussions with consultants, evaluation of patient's response to treatment, examination of patient, obtaining history from patient or surrogate, ordering and performing treatments and interventions, ordering and review of laboratory studies, ordering and review of radiographic studies, pulse oximetry and re-evaluation of patient's condition.  Eston Hence MSN, AGACNP-BC [facility] Amion for pager  04/08/2024, 11:43 AM

## 2024-04-08 NOTE — Progress Notes (Signed)
 Daily Progress Note   Date: 04/08/2024   Patient Name: Alison Lopez  DOB: 09/02/1956  MRN: [id number]  Age / Sex: 68 y.o., female  Attending Physician: Wilfredo Hanly, MD Primary Care Physician: Alexander Anes, MD Admit Date: 03/30/2024 Length of Stay: 9 days  Reason for Consultation: Establishing goals of care  Past Medical History:  Diagnosis Date   Anxiety    Arthritis    Collagen vascular disease (HCC)    COPD (chronic obstructive pulmonary disease) (HCC)    DDD (degenerative disc disease), lumbar    Dementia (HCC)    Depression    Diabetes mellitus without complication (HCC)    DVT (deep venous thrombosis) (HCC)    Fibromyalgia    GERD (gastroesophageal reflux disease)    Graves' disease with exophthalmos    HLD (hyperlipidemia)    Hypertension    Hypothyroidism    Lupus    Osteoporosis     Subjective:   Subjective: Chart Reviewed. Updates received. Patient Assessed. Created space and opportunity for patient  and family to explore thoughts and feelings regarding current medical situation.  Today's Discussion: Today before meeting with the patient/family, I reviewed the chart including neurology note from today, critical care note from today.  Reviewed CBGs from today, done by lab which show stable blood sugars.  Most recent imaging completed 5 days ago with no acute intracranial abnormality.  Reviewed EEG results as well reviewed by Dr. Merceda Stairs.  Today saw the patient at bedside, no family is present.  She remains comatose, unresponsive despite stimulation.  PCCM nurse practitioner Eston Hence reached out to family) the clinical situation.  Later in today I reached out to the patient's daughter.  We had a voluntary discussion about goals of care.  We reviewed what PCCM had explained to the patient and in essence daughter understands that he do not think that she will get better and they cannot really leave her on a oral endotracheal tube and ventilator for  longer than 2 weeks.  She expresses that she is scared and this is a hard decision and she needs to speak with family.  We spent time talking about further details regarding her clinical status.  Daughter shares that her mother previously told her "do not ever send me to a restaurant."  However, she is struggling to make the decision to "pull the plug".  We spent time reframing it as allowing a natural dying process which is already started to complete.  I also shared that she may not be making a decision for her mother as her mother previously made a decision when she told her "on stimulus,".  We discussed that she continues full aggressive care that this would entail trach, PEG, SNF for a long time or indefinitely.  The patient's daughter was understandably very distraught.  She shares that she will speak with family and is going to try to come in on Sunday with her children.  I offered a palliative medicine can follow-up and gave her our contact number to let us  know when she would be arriving.  Nursing is also aware to let us  know if family arrives this weekend.  I provided emotional and general support through therapeutic listening, empathy, sharing of stories, and other techniques. I answered all questions and addressed all concerns to the best of my ability.  After speaking with the patient's daughter I debriefed with the medical team with the plan moving forward.  Review of Systems  Unable to perform  ROS   Objective:   Primary Diagnoses: Present on Admission: **None**   Vital Signs:  BP (!) 113/53   Pulse 95   Temp 99 F (37.2 C) (Axillary)   Resp (!) 21   Wt 48.3 kg   SpO2 98%   BMI 23.04 kg/m   Physical Exam Vitals and nursing note reviewed.  Constitutional:      General: She is sleeping. She is not in acute distress.    Interventions: She is intubated.     Comments: Comatose  HENT:     Head: Normocephalic and atraumatic.  Cardiovascular:     Rate and Rhythm: Normal  rate.  Pulmonary:     Effort: Pulmonary effort is normal. No respiratory distress. She is intubated.  Abdominal:     General: Abdomen is flat.     Palpations: Abdomen is soft.  Skin:    General: Skin is warm and dry.  Neurological:     Mental Status: She is unresponsive.     Palliative Assessment/Data: 10%   Advanced Care Planning:   Existing Vynca/ACP Documentation: None  Primary Decision Maker: NEXT OF KIN  Pertinent diagnosis: Seizure, acute respiratory failure, comatose  The patient and/or family consented to a voluntary Advance Care Planning Conversation over the phone. Individuals present for the conversation:  Summary of the conversation: Discussed current clinical situation, severe situation essentially comatose.  Discussed the unlikelihood that she would ever regain consciousness or be able to have a functional life.  Discussed paths forward including trach/PEG/SNF versus transition to comfort care.  Discussed need to make a decision in the coming days.  Outcome of the conversations and/or documents completed: Continue full code and current scope of care, daughter to come in this weekend for further discussions.  I spent 20 minutes providing separately identifiable ACP services with the patient and/or surrogate decision maker in a voluntary, in-person conversation discussing the patient's wishes and goals as detailed in the above note.  Assessment & Plan:   HPI/Patient Profile:  68 y.o. female  with past medical history of COPD, dementia, diabetes, DVT, antiphospholipid syndrome and DVT on Eliquis , hypertension, lupus, hypothyroidism, and recent admission for subdural hematoma in April 2025 with bur hole evacuation. Residual right-sided weakness. She was discharged from SNF about 1 week prior to her presentation 5/21 with chief complaint of new onset seizure and altered mental status.  She was admitted on 03/30/2024 with focal status epilepticus with encephalopathy, acute  infarcts with extensive chronic cerebral cortex infarction, spontaneous subdural hematoma status postevacuation by bur hole acute hypoxic respiratory failure, concern for aspiration, sepsis because of acute cystitis antiphospholipid syndrome, and others.    Palliative medicine consulted for GOC conversations.  SUMMARY OF RECOMMENDATIONS   Full code Full scope of care Emotional support of patient's family Ongoing GOC conversations Palliative medicine available over the weekend and will try to follow-up on Sunday when daughter arrives Please notify us  of any significant clinical change or new palliative needs before then  Symptom Management:  Per primary team PMT is available to assist as needed  Code Status: Full code  Prognosis: Unable to determine  Discharge Planning: To Be Determined  Discussed with: Patient's family, medical team, bedside nursing  Thank you for allowing us  to participate in the care of Alison Lopez PMT will continue to support holistically.  Billing based on MDM: High  Problems Addressed: One acute or chronic illness or injury that poses a threat to life or bodily function  Amount and/or Complexity of  Data: Category 1:Review of prior external note(s) from each unique source, Review of the result(s) of each unique test, and Assessment requiring an independent historian(s) and Category 3:Discussion of management or test interpretation with external physician/other qualified health care professional/appropriate source (not separately reported)  Risks: N/A  Detailed review of medical records (labs, imaging, vital signs), medically appropriate exam, discussed with treatment team, counseling and education to patient, family, & staff, documenting clinical information, medication management, coordination of care  Lizbeth Right, NP Palliative Medicine Team  Team Phone # [phone] (Nights/Weekends)  07/09/2021, 8:17 AM

## 2024-04-09 ENCOUNTER — Inpatient Hospital Stay (HOSPITAL_COMMUNITY)

## 2024-04-09 DIAGNOSIS — J9601 Acute respiratory failure with hypoxia: Secondary | ICD-10-CM | POA: Diagnosis not present

## 2024-04-09 DIAGNOSIS — R569 Unspecified convulsions: Secondary | ICD-10-CM | POA: Diagnosis not present

## 2024-04-09 DIAGNOSIS — K140 Glossitis: Secondary | ICD-10-CM

## 2024-04-09 LAB — GLUCOSE, CAPILLARY
Glucose-Capillary: 149 mg/dL — ABNORMAL HIGH (ref 70–99)
Glucose-Capillary: 138 mg/dL — ABNORMAL HIGH (ref 70–99)
Glucose-Capillary: 139 mg/dL — ABNORMAL HIGH (ref 70–99)
Glucose-Capillary: 126 mg/dL — ABNORMAL HIGH (ref 70–99)
Glucose-Capillary: 134 mg/dL — ABNORMAL HIGH (ref 70–99)

## 2024-04-09 NOTE — Progress Notes (Signed)
 MB performed maintenance on electrodes P7, C3, Pz, O1, O2. All impedances are now below 10k ohms. No skin breakdown noted.

## 2024-04-09 NOTE — Progress Notes (Signed)
 NAME:  Alison Lopez, MRN:  [id number], DOB:  09-Feb-1956, LOS: 10 ADMISSION DATE:  03/30/2024, CONSULTATION DATE: 5/22 REFERRING MD:  Dr Gladstone Lamer, CHIEF COMPLAINT: Seizure  History of Present Illness:  Patient is encephalopathic and/or intubated; therefore, history has been obtained from chart review.  68 year old female with past medical history as below, which is significant for COPD, dementia, diabetes, DVT, antiphospholipid syndrome and DVT on Eliquis , hypertension, lupus, hypothyroidism, and recent admission for subdural hematoma in April 2025 with bur hole evacuation.  Residual right-sided weakness.  She was discharged from SNF about 1 week prior to her presentation 5/21 with chief complaint of new onset seizure and altered mental status.  Seizures were indeed witnessed in the emergency department and she was given Ativan  and Keppra .  She continued to have seizures despite treatment and was given additional Ativan  and Keppra .  She was also started on ceftriaxone  for UTI.  Seizures were focal in nature with shoulder and facial twitching.  CT of the head showed decreasing subdural size.  MRI MRV showed multiple scattered foci of small infarcts.  She was transferred from [facility] to [facility] for continuous EEG monitoring.  She continued to seize even despite Vimpat  load and was transferred to ICU for phenobarbital  administration and concern for airway.  PCCM is consulted.  Pertinent  Medical History   has a past medical history of Anxiety, Arthritis, Collagen vascular disease (HCC), COPD (chronic obstructive pulmonary disease) (HCC), DDD (degenerative disc disease), lumbar, Dementia (HCC), Depression, Diabetes mellitus without complication (HCC), DVT (deep venous thrombosis) (HCC), Fibromyalgia, GERD (gastroesophageal reflux disease), Graves' disease with exophthalmos, HLD (hyperlipidemia), Hypertension, Hypothyroidism, Lupus, and Osteoporosis.   Significant Hospital Events: Including procedures,  antibiotic start and stop dates in addition to other pertinent events   5/22 transferred to ICU for seizure and worsening mental status, intubated for airway protection, required pressors and line placement, L subclavian placed arterially, vascular consulted and removed, subclavian stents placed  5/23 Stable overnight, remains on low dose norepi. Had an episode of L sided shaking, seen by neuro, low suspicion sz and remains on LTM 5/24 remains off continuous sedation this a.m. and tolerating SBT but mentation remains very poor 5/26 tol SBT, only responding to pain.  5/28 LTM remains in place with recurrent seizures seen prompting initiation of perampanel   5/29 remains on LTM, opens eyes to verbal and physical stimuli, unable to follow commands.  Mentation continues to limit extubation 5/30 intended for PMT mtg   Interim History / Subjective:   No acute events overnight  Objective    Blood pressure (!) 134/58, pulse 94, temperature 97.7 F (36.5 C), temperature source Axillary, resp. rate 19, weight 48.2 kg, SpO2 99%.    Vent Mode: CPAP;PSV FiO2 (%):  [40 %] 40 % Set Rate:  [18 bmp] 18 bmp Vt Set:  [310 mL] 310 mL PEEP:  [5 cmH20] 5 cmH20 Pressure Support:  [5 cmH20-8 cmH20] 8 cmH20 Plateau Pressure:  [10 cmH20-13 cmH20] 13 cmH20   Intake/Output Summary (Last 24 hours) at 04/09/2024 1029 Last data filed at 04/09/2024 0800 Gross per 24 hour  Intake 1055 ml  Output 1160 ml  Net -105 ml   Filed Weights   04/07/24 0500 04/08/24 0500 04/09/24 0500  Weight: 49.4 kg 48.3 kg 48.2 kg   Physical Exam: General: elderly woman, chronically ill, intubated HEENT: ETT secure protuberant tongue EEG  in place.   Neuro: Does not follow commands. Opens eyes CV: rr cap refill < 3 sec  PULM:  mechanically ventilated, even unlabored on PSV  GI: soft ndnt  Extremities: no acute joint deformity   Resolved problem list  Sepsis secondary to acute cystitis: -Urine culture obtained at [facility] with 30,000  colonies of E. Coli resistant to Cipro and nitrofurantoin Aspiration pneumonia concern -Completed 5 days ceftriaxone    Assessment and Plan   Focal SE with post ictal encephalopathy  Multifocal acute infarcts Hx Spontaneous SDH (01/2024) P: -vimpat  keppra  perampanel  -cEEG  -Unable to wean antiepileptics at this time due to concern of return of seizures, may be contributing to decreased mental status  Acute hypoxic respiratory failure Concern for aspiration Tongue edema  P: -WUA/SBT  -mentation hard limitation for extubation, also would worry about tongue edema / possible physical airway compromise -if GOC yields path of ongoing aggressive care, trach will need to be considered  Antiphospholipid syndrome  Chronic Thrombocytopenia  -with history of DVT on chronic Eliquis  -Low-dose Home Eliquis  resumed 5/23 but unable to tolerate full dose which still puts her at risk for further clotting episodes.  P: -ASA, eliquis  -- reduced dose  Hypothyroidism  P: -synthroid    Malpositioned L subclavian CVC -removed by vascular surgery 5/22 -Per vascular given subclavian stenting patient would benefit from low-dose aspirin  to maintain stent patency moving forward P: -ASA  GOC  -Poor prognosis given difficult to control seizure, PMT following with tentative plan for goals of care meeting planned 6/1  Best Practice (right click and "Reselect all SmartList Selections" daily)   Diet/type: tubefeeds DVT prophylaxis SCD Pressure ulcer(s): pressure ulcer assessment deferred  GI prophylaxis: N/A Lines: Central line Foley:  N/A Code Status:  full code Last date of multidisciplinary goals of care discussion [daughter coming in tomorrow for discussion with palliative care]  Critical care time: 45 minutes   Duaine German, MD [facility] Office: [phone]   See Amion for personal pager PCCM on call pager [phone] until 7pm. Please call Elink 7p-7a.  [phone]

## 2024-04-09 NOTE — Procedures (Signed)
 Patient Name: Alison Lopez  MRN: [id number]  Epilepsy Attending: Arleene Lack  Referring Physician/Provider: Ronnette Coke, MD  Duration: 04/08/2024 1759 to 04/09/2024 1759   Patient history: 68 y.o. female with a history of seizure who is undergoing an EEG to evaluate for seizures.   Level of alertness:  lethargic   AEDs during EEG study: LEV,  LCM, Perampanel    Technical aspects: This EEG study was done with scalp electrodes positioned according to the 10-20 International system of electrode placement. Electrical activity was reviewed with band pass filter of 1-70Hz , sensitivity of 7 uV/mm, display speed of 75mm/sec with a 60Hz  notched filter applied as appropriate. EEG data were recorded continuously and digitally stored.  Video monitoring was available and reviewed as appropriate.   Description: EEG showed continuous generalized and lateralized left hemisphere predominantly 5-7hz  theta slowing admixed with intermittent 2-3hz  delta slowing. Sharp waves were noted in left centro-parietal region, qasi-periodic every 2-5 seconds. Hyperventilation and photic stimulation were not performed.       ABNORMALITY - Sharp waves, left centro-parietal region - Continuous slow, generalized and lateralized left hemisphere    IMPRESSION:  This study showed evidence of epileptogenicity arising from left centro- parietal region. Additionally there was cortical dysfunction in left hemisphere likely secondary to underlying structural abnormality. Lastly there was moderate diffuse encephalopathy. No definite seizures were noted.   Tyton Abdallah O Shawne Eskelson

## 2024-04-09 NOTE — Progress Notes (Signed)
 eLink Physician-Brief Progress Note Patient Name: Alison Lopez DOB: 03-06-1956 MRN: [id number]   Date of Service  04/09/2024  HPI/Events of Note  2 days prior, concern for ongoing retention  eICU Interventions  Foley retention protocol, In-N-Out cath as needed, Foley per protocol     Intervention Category Minor Interventions: Routine modifications to care plan (e.g. PRN medications for pain, fever)  Margery Szostak 04/09/2024, 12:26 AM

## 2024-04-10 ENCOUNTER — Inpatient Hospital Stay (HOSPITAL_COMMUNITY)

## 2024-04-10 DIAGNOSIS — J9601 Acute respiratory failure with hypoxia: Secondary | ICD-10-CM | POA: Diagnosis not present

## 2024-04-10 DIAGNOSIS — K140 Glossitis: Secondary | ICD-10-CM | POA: Diagnosis not present

## 2024-04-10 DIAGNOSIS — R569 Unspecified convulsions: Secondary | ICD-10-CM | POA: Diagnosis not present

## 2024-04-10 LAB — POCT I-STAT 7, (LYTES, BLD GAS, ICA,H+H)
Acid-Base Excess: 9 mmol/L — ABNORMAL HIGH (ref 0.0–2.0)
Bicarbonate: 33.3 mmol/L — ABNORMAL HIGH (ref 20.0–28.0)
Calcium, Ion: 1.25 mmol/L (ref 1.15–1.40)
HCT: 22 % — ABNORMAL LOW (ref 36.0–46.0)
Hemoglobin: 7.5 g/dL — ABNORMAL LOW (ref 12.0–15.0)
O2 Saturation: 100 %
Patient temperature: 98.9
Potassium: 4.5 mmol/L (ref 3.5–5.1)
Sodium: 141 mmol/L (ref 135–145)
TCO2: 35 mmol/L — ABNORMAL HIGH (ref 22–32)
pCO2 arterial: 44.5 mmHg (ref 32–48)
pH, Arterial: 7.483 — ABNORMAL HIGH (ref 7.35–7.45)
pO2, Arterial: 157 mmHg — ABNORMAL HIGH (ref 83–108)

## 2024-04-10 LAB — GLUCOSE, CAPILLARY
Glucose-Capillary: 119 mg/dL — ABNORMAL HIGH (ref 70–99)
Glucose-Capillary: 131 mg/dL — ABNORMAL HIGH (ref 70–99)
Glucose-Capillary: 133 mg/dL — ABNORMAL HIGH (ref 70–99)
Glucose-Capillary: 133 mg/dL — ABNORMAL HIGH (ref 70–99)
Glucose-Capillary: 156 mg/dL — ABNORMAL HIGH (ref 70–99)
Glucose-Capillary: 95 mg/dL (ref 70–99)
Glucose-Capillary: 97 mg/dL (ref 70–99)

## 2024-04-10 MED ORDER — LACOSAMIDE 50 MG PO TABS
100.0000 mg | ORAL_TABLET | Freq: Two times a day (BID) | ORAL | Status: DC
  Administered 2024-04-10: 100 mg
  Filled 2024-04-10 (×2): qty 2

## 2024-04-10 MED ORDER — LEVETIRACETAM (KEPPRA) 500 MG/5 ML ADULT IV PUSH
500.0000 mg | Freq: Two times a day (BID) | INTRAVENOUS | Status: DC
  Administered 2024-04-10: 500 mg via INTRAVENOUS
  Filled 2024-04-10: qty 5

## 2024-04-10 MED ORDER — LEVETIRACETAM 500 MG PO TABS
500.0000 mg | ORAL_TABLET | Freq: Two times a day (BID) | ORAL | Status: DC
  Administered 2024-04-10: 500 mg
  Filled 2024-04-10 (×2): qty 1

## 2024-04-10 NOTE — Progress Notes (Signed)
 LTM maint complete - no skin breakdown under:  O1,Fp2.

## 2024-04-10 NOTE — Progress Notes (Signed)
 Daily Progress Note   Patient Name: Alison Lopez       Date: 04/10/2024 DOB: [birth year]  Age: 68 y.o. MRN#: [id number] Attending Physician: Wilfredo Hanly, MD Primary Care Physician: Alexander Anes, MD Admit Date: 03/30/2024  Reason for Consultation/Follow-up: Establishing goals of care  Length of Stay: 11  Current Medications: Scheduled Meds:   sodium chloride    Intravenous Once   amantadine   100 mg Per Tube BID   apixaban   2.5 mg Per Tube BID   aspirin   81 mg Per Tube Daily   Chlorhexidine  Gluconate Cloth  6 each Topical Daily   famotidine   20 mg Per Tube Daily   feeding supplement (PROSource TF20)  60 mL Per Tube Daily   fiber supplement (BANATROL TF)  60 mL Per Tube BID   insulin  aspart  0-9 Units Subcutaneous Q4H   lacosamide   100 mg Per Tube BID   levETIRAcetam   500 mg Per Tube BID   levothyroxine   125 mcg Per Tube Q0600   multivitamin with minerals  1 tablet Per Tube Daily   mouth rinse  15 mL Mouth Rinse Q2H   perampanel   2 mg Per Tube QHS   polyethylene glycol  17 g Per Tube Daily   rosuvastatin   20 mg Per Tube QHS   sodium chloride  flush  10-40 mL Intracatheter Q12H   sodium chloride  flush  3 mL Intravenous Q12H    Continuous Infusions:  feeding supplement (OSMOLITE 1.5 CAL) 40 mL/hr at 04/10/24 1300    PRN Meds: acetaminophen  **OR** acetaminophen , fentaNYL  (SUBLIMAZE ) injection, midazolam , mouth rinse, sodium chloride  flush  Physical Exam Vitals reviewed.  Constitutional:      General: She is not in acute distress.    Appearance: She is ill-appearing.     Interventions: She is intubated.  HENT:     Mouth/Throat:     Comments: Swollen tongue Cardiovascular:     Rate and Rhythm: Normal rate.  Pulmonary:     Effort: She is intubated.     Comments:  ventilator Skin:    General: Skin is warm and dry.  Neurological:     Comments: Follows some commands             Vital Signs: BP (!) 104/53   Pulse 77   Temp 98.5 F (36.9 C) (  Axillary)   Resp 20   Wt 48.2 kg   SpO2 99%   BMI 22.99 kg/m  SpO2: SpO2: 99 % O2 Device: O2 Device: Ventilator O2 Flow Rate: O2 Flow Rate (L/min): 3 L/min   Palliative Assessment/Data: 30% with feeds      Patient Active Problem List   Diagnosis Date Noted   Sepsis due to gram-negative UTI (HCC) 03/29/2024   Partial seizure (HCC) 03/29/2024   Seizure (HCC) 03/29/2024   Subdural hematoma (HCC) 02/16/2024   Acute encephalopathy 02/16/2024   Acute anemia 02/16/2024   Acquired hypothyroidism 02/16/2024   SDH (subdural hematoma) (HCC) 02/08/2024   Bilateral subdural hematomas (HCC) 01/29/2024   Compression of brain (HCC) 01/29/2024   Dementia without behavioral disturbance (HCC) 01/29/2024   Antiphospholipid antibody syndrome (HCC) 01/21/2024   Thrombocytopenia (HCC) 01/21/2024   B12 deficiency 01/21/2024   Neovascular glaucoma of right eye 11/28/2019   COPD (chronic obstructive pulmonary disease) (HCC) 06/28/2019   Depression 06/28/2019   Fibromyalgia 06/28/2019   GERD (gastroesophageal reflux disease) 06/28/2019   HLD (hyperlipidemia) 06/28/2019   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 06/28/2019   Rheumatoid arthritis (HCC) 06/28/2019   Diabetes (HCC) 05/17/2019   Essential hypertension 05/17/2019   DVT (deep venous thrombosis) (HCC) 04/21/2019   Right leg pain 03/21/2019   Compression fracture of L2 (HCC) 01/28/2017   Osteoporosis of multiple sites 01/28/2017   Pain in joint, pelvic region and thigh 01/28/2017   Abnormal intentional weight loss 12/16/2016   DDD (degenerative disc disease), lumbar 12/16/2016   Generalized osteoarthritis of hand 12/16/2016   Anticoagulant long-term use 01/31/2015   Episode of dizziness 01/25/2015   History of DVT of lower extremity 01/25/2015   DM2  (diabetes mellitus, type 2) (HCC) 01/25/2015    Palliative Care Assessment & Plan   Patient Profile: 68 y.o. female  with past medical history of COPD, dementia, diabetes, DVT, antiphospholipid syndrome and DVT on Eliquis , hypertension, lupus, hypothyroidism, and recent admission for subdural hematoma in April 2025 with bur hole evacuation. Residual right-sided weakness. She was discharged from SNF about 1 week prior to her presentation 5/21 with chief complaint of new onset seizure and altered mental status.  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    Palliative medicine consulted for GOC conversations.  Today's Discussion: Reviewed chart and received update from nursing.  Patient is more alert today and more responsive today.  Nursing shared the patient's seizure medications had been reduced.  Met with patient's daughter [name] and Amber's children at bedside. They are excited to see the improvement in the patient.  The patient's grandson shared that the patient was able to squeeze his hand when he asked. 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 We discuss what level of recovery the patient would find acceptable. Every member of the family shared that the patient would not want a life in a nursing home. 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 I shared my concern that continuing full scope of care would lead to the patient requiring nursing home level care. The family shared the patient is not afraid of dying. She has told  family members that she is ready to go to heaven.  Family  are struggling because they want more time with her. Emotional support provided through therapeutic listening. The family share that they do not want to watch the patient suffer and they want to follow her wishes even though this is difficult for them. I shared with the family what comfort measures and end-of-life would look like for this patient.  After some time alone to discuss options the family made the decision to transition to comfort measures Monday 6/2 at 8 AM. The patient loved watching the sunrise and they want her to experience this before transitioning to comfort. Patient's daughter is very upset and stated she is not emotionally strong enough to be present at that time. I reassured her that we would make every effort to ensure her mother has dignity and comfort at the end of life. They would like a chaplain to pray with their mother when she is transition to comfort measures.  I confirmed with patient's daughter that we would change her CODE STATUS to DNR now and we would not escalate care.  She understands and agrees.  Encourage patient's daughter to call PMT with any questions or concerns.  Recommendations/Plan: Changed to DNR Do not escalate care- plan to transition to comfort measures Monday 6/2 at 8 am Spiritual care consult for end-of-life Continued PMT support   Code Status:    Code Status Orders  (From admission, onward)           Start     Ordered   03/30/24 1726  Full code  Continuous       Question:  By:  Answer:  Consent: discussion documented in EHR   03/30/24 1725         Extensive chart review has been completed prior to seeing the patient including labs, vital signs, imaging, progress/consult notes, orders, medications, and available advance directive documents.  Care plan was discussed with bedside RN and Dr. Diania Fortes  Time spent: 95 minutes  Thank you for allowing the Palliative Medicine Team to assist in the care of this patient.    Daina Drum,  NP  Please contact Palliative Medicine Team phone at [phone] for questions and concerns.

## 2024-04-10 NOTE — Progress Notes (Signed)
 NAME:  Alison Lopez, MRN:  [id number], DOB:  Jul 10, 1956, LOS: 11 ADMISSION DATE:  03/30/2024, CONSULTATION DATE: 5/22 REFERRING MD:  Dr Gladstone Lamer, CHIEF COMPLAINT: Seizure  History of Present Illness:  Patient is encephalopathic and/or intubated; therefore, history has been obtained from chart review.  68 year old female with past medical history as below, which is significant for COPD, dementia, diabetes, DVT, antiphospholipid syndrome and DVT on Eliquis , hypertension, lupus, hypothyroidism, and recent admission for subdural hematoma in April 2025 with bur hole evacuation.  Residual right-sided weakness.  She was discharged from SNF about 1 week prior to her presentation 5/21 with chief complaint of new onset seizure and altered mental status.  Seizures were indeed witnessed in the emergency department and she was given Ativan  and Keppra .  She continued to have seizures despite treatment and was given additional Ativan  and Keppra .  She was also started on ceftriaxone  for UTI.  Seizures were focal in nature with shoulder and facial twitching.  CT of the head showed decreasing subdural size.  MRI MRV showed multiple scattered foci of small infarcts.  She was [facility] to [facility] for continuous EEG monitoring.  She continued to seize even despite Vimpat  load and was transferred to ICU for phenobarbital  administration and concern for airway.  PCCM is consulted.  Pertinent  Medical History   has a past medical history of Anxiety, Arthritis, Collagen vascular disease (HCC), COPD (chronic obstructive pulmonary disease) (HCC), DDD (degenerative disc disease), lumbar, Dementia (HCC), Depression, Diabetes mellitus without complication (HCC), DVT (deep venous thrombosis) (HCC), Fibromyalgia, GERD (gastroesophageal reflux disease), Graves' disease with exophthalmos, HLD (hyperlipidemia), Hypertension, Hypothyroidism, Lupus, and Osteoporosis.   Significant Hospital Events: Including procedures,  antibiotic start and stop dates in addition to other pertinent events   5/22 transferred to ICU for seizure and worsening mental status, intubated for airway protection, required pressors and line placement, L subclavian placed arterially, vascular consulted and removed, subclavian stents placed  5/23 Stable overnight, remains on low dose norepi. Had an episode of L sided shaking, seen by neuro, low suspicion sz and remains on LTM 5/24 remains off continuous sedation this a.m. and tolerating SBT but mentation remains very poor 5/26 tol SBT, only responding to pain.  5/28 LTM remains in place with recurrent seizures seen prompting initiation of perampanel   5/29 remains on LTM, opens eyes to verbal and physical stimuli, unable to follow commands.  Mentation continues to limit extubation 5/30 intended for PMT mtg   Interim History / Subjective:   No acute events overnight Plan discussed with neurology, plan to lower keppra  dosing today and monitor if mental status improves.  Increased spontaneous movements today, still not following commands  Objective    Blood pressure 102/77, pulse 89, temperature 99 F (37.2 C), temperature source Axillary, resp. rate (!) 21, weight 48.2 kg, SpO2 99%.    Vent Mode: PRVC FiO2 (%):  [40 %] 40 % Set Rate:  [18 bmp] 18 bmp Vt Set:  [310 mL] 310 mL PEEP:  [5 cmH20] 5 cmH20 Pressure Support:  [8 cmH20] 8 cmH20 Plateau Pressure:  [12 cmH20] 12 cmH20   Intake/Output Summary (Last 24 hours) at 04/10/2024 0801 Last data filed at 04/10/2024 0600 Gross per 24 hour  Intake 975 ml  Output 885 ml  Net 90 ml   Filed Weights   04/08/24 0500 04/09/24 0500 04/10/24 0500  Weight: 48.3 kg 48.2 kg 48.2 kg   Physical Exam: General: elderly woman, chronically ill, intubated HEENT: ETT in place,  tongue edema appears to be improving Neuro: Does not follow commands. Opens eyes, moves left arm, withdraw to pain in bilateral lower extremities, RUE no movement or withdrawal  to pain CV: rrr PULM: mechanically ventilated, even unlabored on PSV  GI: soft ndnt  Extremities: no acute joint deformity   Resolved problem list  Sepsis secondary to acute cystitis: -Urine culture obtained at [facility] with 30,000 colonies of E. Coli resistant to Cipro and nitrofurantoin Aspiration pneumonia concern -Completed 5 days ceftriaxone    Assessment and Plan   Focal SE with post ictal encephalopathy  Multifocal acute infarcts Hx Spontaneous SDH (01/2024) P: -vimpat  keppra  perampanel  -cEEG  -Will wean keppra  today  Acute hypoxic respiratory failure Concern for aspiration Tongue edema  P: -Mentation is barrier to extubation along with secretions - check tracheal aspirate - Tongue edema appears to be improving - if goals of care result in aiming towards a prolonged recovery, will likely need a tracheostomy - check ABG for hypercapnea, elevated serum bicarb levels  Antiphospholipid syndrome  Chronic Thrombocytopenia  -with history of DVT on chronic Eliquis  -Low-dose Home Eliquis  resumed 5/23 but unable to tolerate full dose which still puts her at risk for further clotting episodes.  P: -ASA, eliquis  -- reduced dose  Hypothyroidism  P: -synthroid    Malpositioned L subclavian CVC -removed by vascular surgery 5/22 -Per vascular given subclavian stenting patient would benefit from low-dose aspirin  to maintain stent patency moving forward P: -ASA  GOC  -Poor prognosis given difficult to control seizure, PMT following with tentative plan for goals of care meeting planned 6/1  Best Practice (right click and "Reselect all SmartList Selections" daily)   Diet/type: tubefeeds DVT prophylaxis SCD Pressure ulcer(s): pressure ulcer assessment deferred  GI prophylaxis: N/A Lines: Central line Foley:  N/A Code Status:  full code Last date of multidisciplinary goals of care discussion [family meeting today]  Critical care time: 35 minutes   Duaine German,  MD [facility] Office: [phone]   See Amion for personal pager PCCM on call pager [phone] until 7pm. Please call Elink 7p-7a. [phone]

## 2024-04-10 NOTE — Progress Notes (Signed)
 LTM maint complete - no skin breakdown under:  Fp1 Fp2 Fz Serviced P4 P8 C3 Fz [facility] monitored, Event button test confirmed by [facility].

## 2024-04-10 NOTE — Procedures (Signed)
 Patient Name: Alison Lopez  MRN: [id number]  Epilepsy Attending: Arleene Lack  Referring Physician/Provider: Ronnette Coke, MD  Duration: 04/09/2024 1759 to 04/10/2024 1759   Patient history: 68 y.o. female with a history of seizure who is undergoing an EEG to evaluate for seizures.   Level of alertness:  lethargic   AEDs during EEG study: LEV,  LCM, Perampanel    Technical aspects: This EEG study was done with scalp electrodes positioned according to the 10-20 International system of electrode placement. Electrical activity was reviewed with band pass filter of 1-70Hz , sensitivity of 7 uV/mm, display speed of 74mm/sec with a 60Hz  notched filter applied as appropriate. EEG data were recorded continuously and digitally stored.  Video monitoring was available and reviewed as appropriate.   Description: EEG showed continuous generalized and lateralized left hemisphere predominantly 5-7hz  theta slowing admixed with intermittent 2-3hz  delta slowing. Sharp waves were noted in left centro-parietal region, qasi-periodic every 2-3 seconds. Hyperventilation and photic stimulation were not performed.       ABNORMALITY - Sharp waves, left centro-parietal region - Continuous slow, generalized and lateralized left hemisphere    IMPRESSION:  This study showed evidence of epileptogenicity arising from left centro- parietal region. Additionally there was cortical dysfunction in left hemisphere likely secondary to underlying structural abnormality. Lastly there was moderate diffuse encephalopathy. No definite seizures were noted.   Ciaran Begay O Lamari Youngers

## 2024-04-10 DEATH — deceased

## 2024-04-11 ENCOUNTER — Inpatient Hospital Stay (HOSPITAL_COMMUNITY)

## 2024-04-11 DIAGNOSIS — J9601 Acute respiratory failure with hypoxia: Secondary | ICD-10-CM | POA: Diagnosis not present

## 2024-04-11 DIAGNOSIS — R569 Unspecified convulsions: Secondary | ICD-10-CM | POA: Diagnosis not present

## 2024-04-11 DIAGNOSIS — K140 Glossitis: Secondary | ICD-10-CM | POA: Diagnosis not present

## 2024-04-11 LAB — GLUCOSE, CAPILLARY: Glucose-Capillary: 87 mg/dL (ref 70–99)

## 2024-04-11 MED ORDER — ACETAMINOPHEN 650 MG RE SUPP: 650.0000 mg | Freq: Four times a day (QID) | RECTAL | Status: DC | PRN

## 2024-04-11 MED ORDER — GLYCOPYRROLATE 1 MG PO TABS: 1.0000 mg | ORAL_TABLET | ORAL | Status: DC | PRN

## 2024-04-11 MED ORDER — GLYCOPYRROLATE 0.2 MG/ML IJ SOLN
0.2000 mg | INTRAMUSCULAR | Status: DC | PRN
  Administered 2024-04-11 (×4): 0.2 mg via INTRAVENOUS
  Filled 2024-04-11 (×3): qty 1

## 2024-04-11 MED ORDER — SODIUM CHLORIDE 0.9 % IV SOLN
100.0000 mg | Freq: Two times a day (BID) | INTRAVENOUS | Status: DC
  Administered 2024-04-11 (×2): 100 mg via INTRAVENOUS
  Filled 2024-04-11 (×4): qty 10

## 2024-04-11 MED ORDER — SODIUM CHLORIDE 0.9% FLUSH: 3.0000 mL | Freq: Two times a day (BID) | INTRAVENOUS | Status: DC

## 2024-04-11 MED ORDER — MIDAZOLAM HCL 2 MG/2ML IJ SOLN
2.0000 mg | INTRAMUSCULAR | Status: DC | PRN
  Administered 2024-04-11: 2 mg via INTRAVENOUS

## 2024-04-11 MED ORDER — FENTANYL 2500MCG IN NS 250ML (10MCG/ML) PREMIX INFUSION
0.0000 ug/h | INTRAVENOUS | Status: DC
  Administered 2024-04-11: 100 ug/h via INTRAVENOUS
  Filled 2024-04-11: qty 250

## 2024-04-11 MED ORDER — LEVETIRACETAM (KEPPRA) 500 MG/5 ML ADULT IV PUSH
500.0000 mg | Freq: Two times a day (BID) | INTRAVENOUS | Status: DC
  Administered 2024-04-11 (×2): 500 mg via INTRAVENOUS
  Filled 2024-04-11 (×2): qty 5

## 2024-04-11 MED ORDER — ACETAMINOPHEN 325 MG PO TABS: 650.0000 mg | ORAL_TABLET | Freq: Four times a day (QID) | ORAL | Status: DC | PRN

## 2024-04-11 MED ORDER — SODIUM CHLORIDE 0.9% FLUSH: 3.0000 mL | INTRAVENOUS | Status: DC | PRN

## 2024-04-11 MED ORDER — POLYVINYL ALCOHOL 1.4 % OP SOLN: 1.0000 [drp] | Freq: Four times a day (QID) | OPHTHALMIC | Status: DC | PRN

## 2024-04-11 MED ORDER — FENTANYL BOLUS VIA INFUSION: 100.0000 ug | INTRAVENOUS | Status: DC | PRN

## 2024-04-11 MED ORDER — GLYCOPYRROLATE 0.2 MG/ML IJ SOLN
0.2000 mg | INTRAMUSCULAR | Status: DC | PRN
  Filled 2024-04-11: qty 1

## 2024-04-11 MED ORDER — FENTANYL 2500MCG IN NS 250ML (10MCG/ML) PREMIX INFUSION
0.0000 ug/h | INTRAVENOUS | Status: DC
  Administered 2024-04-12: 200 ug/h via INTRAVENOUS
  Filled 2024-04-11: qty 250

## 2024-04-11 NOTE — Procedures (Signed)
 Extubation Procedure Note  Patient Details:   Name: Alison Lopez DOB: May 05, 1956 MRN: [id number]   Airway Documentation:  Airway 7.5 mm (Active)  Secured at (cm) 23 cm 04/11/24 0839  Measured From Lips 04/11/24 0839  Secured Location Center 04/11/24 0839  Secured By [employer] 04/11/24 0839  Bite Block No 04/11/24 0839  Tube Holder Repositioned Yes 04/11/24 0839  Prone position No 04/11/24 0330  Cuff Pressure (cm H2O) Clear OR 27-39 CmH2O 04/11/24 0839  Site Condition Dry 04/11/24 0839   Vent end date: (not recorded) Vent end time: (not recorded)   Evaluation  O2 sats: stable throughout Complications: No apparent complications Patient did tolerate procedure well. Bilateral Breath Sounds: Clear   No One way extubation.  Helene Loader T 04/11/2024, 9:08 AM

## 2024-04-11 NOTE — Progress Notes (Addendum)
 eLink Physician-Brief Progress Note Patient Name: Alison Lopez DOB: January 13, 1956 MRN: [id number]   Date of Service  04/11/2024  HPI/Events of Note  Transition to comfort measures, but routine meds and labs were never addressed.  eICU Interventions  Discontinuing non comfort oriented medications and laboratory studies.  Given ongoing seizures, maintain Vimpat  and Keppra      Intervention Category Minor Interventions: Routine modifications to care plan (e.g. PRN medications for pain, fever)  Sire Poet 04/11/2024, 10:32 PM

## 2024-04-11 NOTE — Progress Notes (Signed)
 LTM VIDEO EEG discontinued - skin breakdown at [facility] under: Fz, Nurse was notified.

## 2024-04-11 NOTE — Progress Notes (Signed)
 This chaplain responded to PMT NP-Dawn consult for spiritual care after Pt. extubation. The Pt. daughter-Amber is at the bedside along with Dr. Diania Fortes.   Amber accepted the chaplain's presence and hug in the processing of anticipatory grief and regret. The chaplain invited Amber to explore the Pt. quality of life. Amber shared the Pt. would have never wanted to live in this way or in a assisted living facility.  Amber accepted the chaplain's encouragement to hold the Pt. hand and love the Pt. in her own unique way.   This chaplain stepped away for Amber's personal with the Pt. Amber is accepting of F/U spiritual care.  Chaplain Kathleene Papas [phone]

## 2024-04-11 NOTE — Progress Notes (Signed)
 LTM maint complete - no skin breakdown under:  F7,C4

## 2024-04-11 NOTE — Procedures (Addendum)
 Patient Name: Alison Lopez  MRN: [id number]  Epilepsy Attending: Arleene Lack  Referring Physician/Provider: Ronnette Coke, MD  Duration: 04/10/2024 1759 to 04/11/2024 1119   Patient history: 68 y.o. female with a history of seizure who is undergoing an EEG to evaluate for seizures.   Level of alertness:  lethargic   AEDs during EEG study: LEV,  LCM, Perampanel    Technical aspects: This EEG study was done with scalp electrodes positioned according to the 10-20 International system of electrode placement. Electrical activity was reviewed with band pass filter of 1-70Hz , sensitivity of 7 uV/mm, display speed of 55mm/sec with a 60Hz  notched filter applied as appropriate. EEG data were recorded continuously and digitally stored.  Video monitoring was available and reviewed as appropriate.   Description: EEG showed continuous generalized and lateralized left hemisphere predominantly 5-7hz  theta slowing admixed with intermittent 2-3hz  delta slowing. Sharp waves were noted in left centro-parietal region, qasi-periodic every 2-3 seconds. Hyperventilation and photic stimulation were not performed.    Of note, parts of study were technically difficult due to electrode artifact.     ABNORMALITY - Sharp waves, left centro-parietal region - Continuous slow, generalized and lateralized left hemisphere    IMPRESSION:  This study showed evidence of epileptogenicity arising from left centro- parietal region. Additionally there was cortical dysfunction in left hemisphere likely secondary to underlying structural abnormality. Lastly there was moderate diffuse encephalopathy. No definite seizures were noted.   Arshdeep Bolger O Khadejah Son

## 2024-04-11 NOTE — Progress Notes (Signed)
 Daily Progress Note   Patient Name: Alison Lopez       Date: 04/11/2024 DOB: 08/14/1956  Age: 68 y.o. MRN#: [id number] Attending Physician: Wilfredo Hanly, MD Primary Care Physician: Alexander Anes, MD Admit Date: 03/30/2024  Reason for Consultation/Follow-up: Establishing goals of care  Length of Stay: 12  Current Medications: Scheduled Meds:   sodium chloride    Intravenous Once   Chlorhexidine  Gluconate Cloth  6 each Topical Daily   lacosamide   100 mg Per Tube BID   levETIRAcetam   500 mg Per Tube BID   mouth rinse  15 mL Mouth Rinse Q2H   sodium chloride  flush  10-40 mL Intracatheter Q12H   sodium chloride  flush  3 mL Intravenous Q12H    Continuous Infusions:  fentaNYL  infusion INTRAVENOUS 100 mcg/hr (04/11/24 1000)    PRN Meds: acetaminophen  **OR** acetaminophen , acetaminophen  **OR** acetaminophen , artificial tears, fentaNYL , fentaNYL  (SUBLIMAZE ) injection, glycopyrrolate  **OR** glycopyrrolate  **OR** glycopyrrolate , midazolam , midazolam , mouth rinse, sodium chloride  flush  Physical Exam Vitals reviewed.  Constitutional:      General: She is not in acute distress.    Appearance: She is ill-appearing.     Interventions: She is intubated.  HENT:     Mouth/Throat:     Comments: Swollen tongue Cardiovascular:     Rate and Rhythm: Normal rate.  Pulmonary:     Effort: She is intubated.     Comments: ventilator Skin:    General: Skin is warm and dry.  Neurological:     Comments: Follows some commands             Vital Signs: BP 104/63   Pulse 90   Temp 98.3 F (36.8 C) (Oral)   Resp 10   Wt 48.2 kg   SpO2 98%   BMI 22.99 kg/m  SpO2: SpO2: 98 % O2 Device: O2 Device: Nasal Cannula O2 Lopez Rate: O2 Lopez Rate (L/min): 3 L/min   Palliative  Assessment/Data: 30% with feeds      Patient Active Problem List   Diagnosis Date Noted   Sepsis due to gram-negative UTI (HCC) 03/29/2024   Partial seizure (HCC) 03/29/2024   Seizure (HCC) 03/29/2024   Subdural hematoma (HCC) 02/16/2024   Acute encephalopathy 02/16/2024   Acute anemia 02/16/2024   Acquired hypothyroidism 02/16/2024   SDH (subdural hematoma) (HCC) 02/08/2024  Bilateral subdural hematomas (HCC) 01/29/2024   Compression of brain (HCC) 01/29/2024   Dementia without behavioral disturbance (HCC) 01/29/2024   Antiphospholipid antibody syndrome (HCC) 01/21/2024   Thrombocytopenia (HCC) 01/21/2024   B12 deficiency 01/21/2024   Neovascular glaucoma of right eye 11/28/2019   COPD (chronic obstructive pulmonary disease) (HCC) 06/28/2019   Depression 06/28/2019   Fibromyalgia 06/28/2019   GERD (gastroesophageal reflux disease) 06/28/2019   HLD (hyperlipidemia) 06/28/2019   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 06/28/2019   Rheumatoid arthritis (HCC) 06/28/2019   Diabetes (HCC) 05/17/2019   Essential hypertension 05/17/2019   DVT (deep venous thrombosis) (HCC) 04/21/2019   Right leg pain 03/21/2019   Compression fracture of L2 (HCC) 01/28/2017   Osteoporosis of multiple sites 01/28/2017   Pain in joint, pelvic region and thigh 01/28/2017   Abnormal intentional weight loss 12/16/2016   DDD (degenerative disc disease), lumbar 12/16/2016   Generalized osteoarthritis of hand 12/16/2016   Anticoagulant long-term use 01/31/2015   Episode of dizziness 01/25/2015   History of DVT of lower extremity 01/25/2015   DM2 (diabetes mellitus, type 2) (HCC) 01/25/2015    Palliative Care Assessment & Plan   Patient Profile: 68 y.o. female  with past medical history of COPD, dementia, diabetes, DVT, antiphospholipid syndrome and DVT on Eliquis , hypertension, lupus, hypothyroidism, and recent admission for subdural hematoma in April 2025 with bur hole evacuation. Residual right-sided  weakness. She was discharged from SNF about 1 week prior to her presentation 5/21 with chief complaint of new onset seizure and altered mental status.  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    Palliative medicine consulted for GOC conversations.  Today's Discussion: Reviewed chart and received update from nursing.  Patient remains alert and responsive today.  Discussed plan for extubation with nursing and chaplain Alison Lopez.  0900: Received message from Dr. Diania Lopez.  Nursing called patient's daughter and she is at bedside. Plan is for patient to be one way extubated without comfort measures. If she does not improve or if she worsens then we will transition to comfort measures.  0930: Checked in on patient and daughter. Patient has been extubated. She continues to have copious secretions requiring frequent suctioning. Patient's daughter is understandably upset.  She states it is very hard to see her mother this way and that it is not fair that her mother has been through this.  Nursing and Dr. Diania Lopez came to bedside.  Decision made shortly after to transition to comfort.  Encourage patient's daughter to call PMT with any questions or concerns.  Recommendations/Plan: DNR Comfort measures Spiritual care consult for end-of-life Continued PMT support   Extensive chart review has been completed prior to seeing the patient including labs, vital signs, imaging, progress/consult notes, orders, medications, and available advance directive documents.  Care plan was discussed with bedside RN, Dr. Diania Lopez, chaplain Alison Lopez  Time spent: 35 minutes  Thank you for allowing the Palliative Medicine Team to assist in  the care of this patient.    Alison Drum, NP  Please contact Palliative Medicine Team phone at [phone] for questions and concerns.

## 2024-04-11 NOTE — Progress Notes (Addendum)
 NAME:  Alison Lopez, MRN:  [id number], DOB:  1956/07/06, LOS: 12 ADMISSION DATE:  03/30/2024, CONSULTATION DATE: 5/22 REFERRING MD:  Dr Gladstone Lamer, CHIEF COMPLAINT: Seizure  History of Present Illness:  Patient is encephalopathic and/or intubated; therefore, history has been obtained from chart review.  68 year old female with past medical history as below, which is significant for COPD, dementia, diabetes, DVT, antiphospholipid syndrome and DVT on Eliquis , hypertension, lupus, hypothyroidism, and recent admission for subdural hematoma in April 2025 with bur hole evacuation.  Residual right-sided weakness.  She was discharged from SNF about 1 week prior to her presentation 5/21 with chief complaint of new onset seizure and altered mental status.  Seizures were indeed witnessed in the emergency department and she was given Ativan  and Keppra .  She continued to have seizures despite treatment and was given additional Ativan  and Keppra .  She was also started on ceftriaxone  for UTI.  Seizures were focal in nature with shoulder and facial twitching.  CT of the head showed decreasing subdural size.  MRI MRV showed multiple scattered foci of small infarcts.  She was transferred from [facility] to [facility] for continuous EEG monitoring.  She continued to seize even despite Vimpat  load and was transferred to ICU for phenobarbital  administration and concern for airway.  PCCM is consulted.  Pertinent  Medical History   has a past medical history of Anxiety, Arthritis, Collagen vascular disease (HCC), COPD (chronic obstructive pulmonary disease) (HCC), DDD (degenerative disc disease), lumbar, Dementia (HCC), Depression, Diabetes mellitus without complication (HCC), DVT (deep venous thrombosis) (HCC), Fibromyalgia, GERD (gastroesophageal reflux disease), Graves' disease with exophthalmos, HLD (hyperlipidemia), Hypertension, Hypothyroidism, Lupus, and Osteoporosis.   Significant Hospital Events: Including procedures,  antibiotic start and stop dates in addition to other pertinent events   5/22 transferred to ICU for seizure and worsening mental status, intubated for airway protection, required pressors and line placement, L subclavian placed arterially, vascular consulted and removed, subclavian stents placed  5/23 Stable overnight, remains on low dose norepi. Had an episode of L sided shaking, seen by neuro, low suspicion sz and remains on LTM 5/24 remains off continuous sedation this a.m. and tolerating SBT but mentation remains very poor 5/26 tol SBT, only responding to pain.  5/28 LTM remains in place with recurrent seizures seen prompting initiation of perampanel   5/29 remains on LTM, opens eyes to verbal and physical stimuli, unable to follow commands.  Mentation continues to limit extubation 5/30 intended for PMT mtg  6/1 Palliative care meeting with decision to extubate to comfort  Interim History / Subjective:   No acute events overnight  Patient more awake this morning and following some commands  Daughter at bedside, given improvement in mental status plan is to move forward with extubation without comfort medications to determine if patient is able to sustain and communicate with us  off the ventilator. After extubation further discussion had and decision made to not let her suffer and comfort orders placed.  Objective    Blood pressure 135/72, pulse 76, temperature 98.3 F (36.8 C), temperature source Oral, resp. rate 14, weight 48.2 kg, SpO2 100%.    Vent Mode: PRVC FiO2 (%):  [40 %] 40 % Set Rate:  [18 bmp] 18 bmp Vt Set:  [310 mL] 310 mL PEEP:  [5 cmH20] 5 cmH20 Pressure Support:  [5 cmH20] 5 cmH20 Plateau Pressure:  [10 cmH20] 10 cmH20   Intake/Output Summary (Last 24 hours) at 04/11/2024 0908 Last data filed at 04/11/2024 0600 Gross per 24 hour  Intake 885.33 ml  Output 1450 ml  Net -564.67 ml   Filed Weights   04/08/24 0500 04/09/24 0500 04/10/24 0500  Weight: 48.3 kg 48.2 kg  48.2 kg   Physical Exam: General: elderly woman, chronically ill, intubated HEENT: ETT in place, tongue edema continues to improve Neuro: awake, following commands CV: rrr PULM: mechanically ventilated, even unlabored on PSV  GI: soft ndnt  Extremities: no acute joint deformity   Resolved problem list  Sepsis secondary to acute cystitis: -Urine culture obtained at [facility] with 30,000 colonies of E. Coli resistant to Cipro and nitrofurantoin Aspiration pneumonia concern -Completed 5 days ceftriaxone    Assessment and Plan   Focal SE with post ictal encephalopathy  Multifocal acute infarcts Hx Spontaneous SDH (01/2024) P: -vimpat  keppra  perampanel  -cEEG  -keppra  weaned yesterday  Acute hypoxic respiratory failure Concern for aspiration Tongue edema  P: - Tongue edema is improving - plan to extubate this morning to comfort  Antiphospholipid syndrome  Chronic Thrombocytopenia  -with history of DVT on chronic Eliquis  -Low-dose Home Eliquis  resumed 5/23 but unable to tolerate full dose which still puts her at risk for further clotting episodes.  P: -stop ASA, eliquis  -- reduced dose  Hypothyroidism  P: -stop synthroid    Malpositioned L subclavian CVC -removed by vascular surgery 5/22 -Per vascular given subclavian stenting patient would benefit from low-dose aspirin  to maintain stent patency moving forward P: -ASA  GOC  -transition to comfort today  Best Practice (right click and "Reselect all SmartList Selections" daily)   Diet/type: tubefeeds DVT prophylaxis SCD Pressure ulcer(s): pressure ulcer assessment deferred  GI prophylaxis: N/A Lines: Central line Foley:  N/A Code Status:  DNR Last date of multidisciplinary goals of care discussion [discussed with daughter at bedside today]  Critical care time: 79 minutes   Duaine German, MD [facility] Office: [phone]   See Amion for personal pager PCCM on call pager [phone]  until 7pm. Please call Elink 7p-7a. [phone]

## 2024-04-12 LAB — CULTURE, RESPIRATORY W GRAM STAIN

## 2024-04-21 ENCOUNTER — Encounter: Admitting: Neurosurgery

## 2024-04-27 ENCOUNTER — Encounter: Admitting: Physical Medicine and Rehabilitation

## 2024-05-10 NOTE — Death Summary Note (Signed)
 DEATH SUMMARY   Patient Details  Name: Alison Lopez MRN: [id number] DOB: 1956/02/20  Admission/Discharge Information   Admit Date:  Apr 08, 2024  Date of Death: Date of Death: 04-21-2024  Time of Death: Time of Death: 0837  Length of Stay: 2024-05-01  Referring Physician: Alexander Anes, MD   Reason(s) for Hospitalization  Seizures  Diagnoses  Preliminary cause of death:  Seizures Multifocal Brain Infarcts  Secondary Diagnoses (including complications and co-morbidities):  Principal Problem:   Seizure (HCC) Multifocal Brain Infarcts Recent Subdural Hemorrhage Acute Hypoxemic Respiratory Failure Aspiration Pnuemonia Tongue Edema Antiphospholipid syndrome  Chronic Thrombocytopenia Urinary Tract Infection Failure To [facility] Course (including significant findings, care, treatment, and services provided and events leading to death)  Alison Lopez is a 68 y.o. year old female who with medical history significant of dementia, spontaneous subdural hematoma in April 2025 (recently hospitalized and discharged to rehab center and came home about a week prior to admission), antiphospholipid syndrome and DVT on Eliquis , hypothyroidism, HLD, chronic thrombocytopenia, brought in by family member for evaluation of new onset of seizure and altered mental status.  She has had right-sided weakness since her recent subdural hematoma.  A day prior to admission, her family noticed tremors involving the right arm and right leg.  Family also noticed that she had become more confused.   She was admitted to the hospital for seizure.  In the hospital, she had recurrent seizure episodes requiring treatment with IV Ativan .  She was also treated with IV Keppra .  Workup was revealed acute UTI and she was started on IV ceftriaxone . Brain MRI showed multiple acute infarcts throughout the brain.  Soon after admission she was transferred to ICU on 5/22 for seizures and cEEG monitoring and  intubated for airway protection. She was not able to be extubated due to inability to wean and her mental status. Antiepileptics were weaned 5/31 with some improvement in her mental status. She had developed severe deconditioning since her initial hospitalization in April 2025 with subdural hematoma.   Discussions held with her daughter and palliative care about her current clinical status and decision made to move forward with comfort care given her poor prognosis.   She was extubated to comfort care on 04-20-24 and passed away [id number]/32025 at 0837.  Pertinent Labs and Studies  Significant Diagnostic Studies CT HEAD WO CONTRAST ( ) Result Date: 04/03/2024 EXAM: CT HEAD WITHOUT 04/03/2024 10:26:09 AM TECHNIQUE: CT of the head was performed without the administration of intravenous contrast. Automated exposure control, iterative reconstruction, and/or weight based adjustment of the mA/kV was utilized to reduce the radiation dose to as low as reasonably achievable. COMPARISON: CT head without contrast 04/01/2024. MR head without and with contrast 08-Apr-2024. CLINICAL HISTORY: Mental status change, persistent or worsening. Principal Problem: Seizure (HCC). FINDINGS: BRAIN AND VENTRICLES: Subacute and remount infarcts are again noted in the posterior parietal and occipital lobes bilaterally. Remote cortical anterior frontal lobes bilaterally are stable. Atrophy is stable. Stable small bilateral subdural are stable. No hyperdense vessel is present. ORBITS: Scleral banding is present on the right globe. SINUSES: Fluid is again noted within the nasopharynx cavity intubation. SOFT TISSUES AND SKULL: Atherosclerotic calcifications are present in the cavernous carotid arteries bilaterally. IMPRESSION: 1. No acute intracranial abnormality. 2. Stable subacute and remote infarcts in the posterior parietal and occipital lobes bilaterally, and remote cortical anterior frontal lobes bilaterally. 3. Stable small bilateral subdural  collections. Electronically signed by: Audree Leas MD 04/03/2024 10:51 AM EDT RP Workstation:  [id number]   CT ANGIO HEAD NECK W WO CM Result Date: 04/01/2024 EXAM: CTA HEAD AND NECK WITH AND WITHOUT 04/01/2024 08:33:56 PM TECHNIQUE: CTA of the head and neck was performed with and without the administration of intravenous contrast. Multiplanar 2D and/or 3D reformatted images are provided for review. Automated exposure control, iterative reconstruction, and/or weight based adjustment of the mA/kV was utilized to reduce the radiation dose to as low as reasonably achievable. Stenosis of the internal carotid arteries measured using NASCET criteria. COMPARISON: CT head without contrast 04/01/2024. MR head without and with contrast 03/30/2024. CLINICAL HISTORY: Stroke/TIA, determine embolic source. Multiple acute infarction involving the occipital lobes bilaterally and the left parietal lobe. The patient is intubated. Oral gastrostomy tube is in place. FINDINGS: CTA NECK: AORTIC ARCH AND ARCH VESSELS: Atherosclerotic changes are present at the aortic arch and vessel origins without focal stenosis. The left vertebral artery originates directly from the aortic arch without significant stenosis. CAROTID ARTERIES: Atherosclerotic calcifications are present at the left carotid bifurcation without significant stenosis. VERTEBRAL ARTERIES: The right vertebral artery is dominant. SOFT TISSUES: The lung apices are clear. No cervical or superior mediastinal lymphadenopathy. The larynx and pharynx are unremarkable. No acute abnormality of the salivary and thyroid  glands. BONES: Mild degenerative changes are present in the lower cervical spine. No focal osseous lesions are present. CTA HEAD: ANTERIOR CIRCULATION: Atherosclerotic calcifications are present within the cavernous internal carotid arteries bilaterally without significant stenosis through the ICA terminus. The left A1 segment is dominant. A high-grade stenosis is  present in the proximal left M1 segment. The superior left M2 segment is occluded. Poor collaterals are present anteriorly. The right MCA branch vessels and bilateral ACA branch vessels are normal. POSTERIOR CIRCULATION: The left P1 segment is occluded proximally. The right PCA is hypoplastic with scattered segmental narrowing. OTHER: No dural venous sinus thrombosis on this non-dedicated study. BRAIN: There is no acute infarct or acute intracranial hemorrhage. No mass effect or midline shift. IMPRESSION: 1. High-grade stenosis in the proximal left M1 segment and occlusion of the superior left M2 segment with poor anterior collaterals. 2. Occlusion of the left P1 segment proximally and hypoplastic right PCA with scattered segmental narrowing. 3. Atherosclerotic changes in the aortic arch, vessel origins, left carotid bifurcation, and cavernous internal carotid arteries bilaterally without significant stenosis. Electronically signed by: Audree Leas MD 04/01/2024 08:50 PM EDT RP Workstation: [id number]   CT HEAD WO CONTRAST ( ) Result Date: 04/01/2024 CLINICAL DATA:  Seizure, new onset, no history of trauma. EXAM: CT HEAD WITHOUT CONTRAST TECHNIQUE: Contiguous axial images were obtained from the base of the skull through the vertex without intravenous contrast. RADIATION DOSE REDUCTION: This exam was performed according to the departmental dose-optimization program which includes automated exposure control, adjustment of the mA and/or kV according to patient size and/or use of iterative reconstruction technique. COMPARISON:  Brain MRI from 2 days ago FINDINGS: Brain: Underestimated areas of cytotoxic edema by prior brain MRI. Pre-existing extensive chronic infarcts especially affecting parietal and occipital cortex. Small volume subdural collection along the bilateral cerebral convexity, on the right measuring up to 6 mm in thickness and on the left measuring up to 3 mm. Collection on the left has greater  high-density character, these are known from prior imaging without evidence of increase. Vascular: No hyperdense vessel or unexpected calcification. Skull: Bilateral burr holes.  No acute or aggressive finding. Sinuses/Orbits: Glaucoma reservoir on the right.  No acute finding IMPRESSION: Recent MRI showing multiple acute infarcts. No interval hemorrhage  or detected progression. Extensive chronic cerebral cortex infarction. Small bilateral subdural collection without progression. Electronically Signed   By: Ronnette Coke M.D.   On: 04/01/2024 11:17   CT ANGIO CHEST AORTA W/CM & OR WO/CM Addendum Date: 03/31/2024 ADDENDUM REPORT: 03/31/2024 17:14 ADDENDUM: These results were called by telephone at the time of interpretation on 03/31/2024 at 5:13 pm to provider Phyllis Breeze MD, who verbally acknowledged these results. Electronically Signed   By: Reagan Camera M.D.   On: 03/31/2024 17:14   Result Date: 03/31/2024 CLINICAL DATA:  Suspected vascular catheter malpositioning EXAM: CT ANGIOGRAPHY CHEST WITH CONTRAST TECHNIQUE: Multidetector CT imaging of the chest was performed using the standard protocol during bolus administration of intravenous contrast. Multiplanar CT image reconstructions and MIPs were obtained to evaluate the vascular anatomy. Multiplanar image (3D post-processing) reconstructions and MIPs were obtained to evaluate the vascular anatomy. RADIATION DOSE REDUCTION: This exam was performed according to the departmental dose-optimization program which includes automated exposure control, adjustment of the mA and/or kV according to patient size and/or use of iterative reconstruction technique. CONTRAST:  75mL OMNIPAQUE  IOHEXOL  350 MG/ML SOLN COMPARISON:  None Available. FINDINGS: Cardiovascular: A vascular catheter is present in the left subclavian artery which terminates in the descending thoracic aorta on image 58 of series 5. The left vertebral artery originates directly from the thoracic aorta  and is small in caliber. Contrast agent is present within the imaged segment of the left axillary artery. Normal size heart. Atherosclerotic changes are present in the left and right coronary territories. Mediastinum/Nodes: No enlarged mediastinal, hilar, or axillary lymph nodes. Thyroid  gland, trachea, and esophagus demonstrate no significant findings. Lungs/Pleura: Consolidation is present in the right lower lobe. To lesser extent, a small focus of consolidation is present in the left lower lobe. Trace right pleural effusion. Minimal left pleural fluid. Patchy randomly distributed ground-glass and tree-in-bud airspace opacities are present which may represent sequelae of bronchiolitis. There is no pneumothorax. An endotracheal tube is present which terminates in the trachea. Mild peribronchial thickening. Upper Abdomen: An enteric tube is present which terminates in the stomach. Partially captured gallstone. Musculoskeletal: No chest wall abnormality. No acute or significant osseous findings. Review of the MIP images confirms the above findings. IMPRESSION: 1. A vascular catheter is present which courses through the left subclavian artery and terminates in the descending thoracic aorta. 2. The left vertebral artery originates directly from the thoracic aorta and is small in caliber (right dominant vertebral artery). 3. Right lower lobe airspace consolidation, possibly sequelae of pneumonia. 4. No pneumothorax. Electronically Signed: By: Reagan Camera M.D. On: 03/31/2024 16:46   HYBRID OR IMAGING ([facility] ONLY) Result Date: 03/31/2024 There is no interpretation for this exam.  This order is for images obtained during a surgical procedure.  Please See "Surgeries" Tab for more information regarding the procedure.   ECHOCARDIOGRAM COMPLETE Result Date: 03/31/2024    ECHOCARDIOGRAM REPORT   Patient Name:   TISHANNA DUNFORD Date of Exam: 03/31/2024 Medical Rec #:  [id number]        Height:       57.0 in Accession #:     [id number]       Weight:       103.0 lb Date of Birth:  [birth year]        BSA:          1.356 m Patient Age:    77 years         BP:  103/61 mmHg Patient Gender: F                HR:           69 bpm. Exam Location:  Inpatient Procedure: 2D Echo, Cardiac Doppler and Color Doppler (Both Spectral and Color            Flow Doppler were utilized during procedure). Indications:    Stroke  History:        Patient has no prior history of Echocardiogram examinations.  Sonographer:    Janette Medley Referring Phys: Faith Homes IMPRESSIONS  1. Left ventricular ejection fraction, by estimation, is 55 to 60%. The left ventricle has normal function. The left ventricle has no regional wall motion abnormalities. Left ventricular diastolic parameters are consistent with Grade II diastolic dysfunction (pseudonormalization).  2. Right ventricular systolic function is low normal. The right ventricular size is normal.  3. Left atrial size was moderately dilated.  4. 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 There is at least moderate centrally directed mitral regurgitation and mild prolapse of posterior mitral valve leaflets.  5. The aortic valve is normal in structure. There is mild calcification of the aortic valve. Aortic valve regurgitation is not visualized. No aortic stenosis is present. There is moderate calcification and thickening of the aortic valve annulus.  6. The inferior vena cava is normal in size with greater than 50% respiratory variability, suggesting right atrial pressure of 3 mmHg. FINDINGS  Left Ventricle: Left ventricular ejection fraction, by estimation, is 55 to 60%. The left ventricle has normal function. The left ventricle has no regional wall motion abnormalities. The left ventricular internal cavity size was normal in size. There is  no left ventricular hypertrophy. Left ventricular diastolic parameters are consistent with Grade II diastolic dysfunction (pseudonormalization). Right  Ventricle: The right ventricular size is normal. No increase in right ventricular wall thickness. Right ventricular systolic function is low normal. Left Atrium: Left atrial size was moderately dilated. Right Atrium: Right atrial size was normal in size. Pericardium: There is no evidence of pericardial effusion. Mitral Valve: The mitral valve is myxomatous. There is moderate thickening of the mitral valve leaflet(s). Moderate mitral valve regurgitation. No evidence of mitral valve stenosis. Tricuspid Valve: The tricuspid valve is normal in structure. Tricuspid valve regurgitation is trivial. No evidence of tricuspid stenosis. Aortic Valve: The aortic valve is normal in structure. There is mild calcification of the aortic valve. There is moderate to severe aortic valve annular calcification. Aortic valve regurgitation is not visualized. No aortic stenosis is present. Aortic valve mean gradient measures 5.0 mmHg. Aortic valve peak gradient measures 10.2 mmHg. Aortic valve area, by VTI measures 2.09 cm. Pulmonic Valve: The pulmonic valve was normal in structure. Pulmonic valve regurgitation is not visualized. No evidence of pulmonic stenosis. Aorta: The aortic root is normal in size and structure. Venous: The inferior vena cava is normal in size with greater than 50% respiratory variability, suggesting right atrial pressure of 3 mmHg. IAS/Shunts: No atrial level shunt detected by color flow Doppler.  LEFT VENTRICLE PLAX 2D LVIDd:         4.50 cm   Diastology LVIDs:         2.90 cm   LV e' medial:    6.85 cm/s LV PW:         0.80 cm   LV E/e' medial:  18.4 LV IVS:        1.00 cm   LV e' lateral:  10.10 cm/s LVOT diam:     2.00 cm   LV E/e' lateral: 12.5 LV SV:         66 LV SV Index:   49 LVOT Area:     3.14 cm  RIGHT VENTRICLE            IVC RV S prime:     8.92 cm/s  IVC diam: 1.80 cm TAPSE (M-mode): 1.6 cm LEFT ATRIUM             Index        RIGHT ATRIUM           Index LA diam:        2.60 cm 1.92 cm/m   RA  Area:     11.30 cm LA Vol (A2C):   33.7 ml 24.85 ml/m  RA Volume:   24.60 ml  18.14 ml/m LA Vol (A4C):   52.2 ml 38.48 ml/m LA Biplane Vol: 41.0 ml 30.23 ml/m  AORTIC VALVE AV Area (Vmax):    2.04 cm AV Area (Vmean):   2.03 cm AV Area (VTI):     2.09 cm AV Vmax:           160.00 cm/s AV Vmean:          103.000 cm/s AV VTI:            0.316 m AV Peak Grad:      10.2 mmHg AV Mean Grad:      5.0 mmHg LVOT Vmax:         104.00 cm/s LVOT Vmean:        66.600 cm/s LVOT VTI:          0.210 m LVOT/AV VTI ratio: 0.66  AORTA Ao Root diam: 2.50 cm Ao Asc diam:  2.80 cm MITRAL VALVE MV Area (PHT): 3.21 cm     SHUNTS MV Decel Time: 236 msec     Systemic VTI:  0.21 m MV E velocity: 126.00 cm/s  Systemic Diam: 2.00 cm MV A velocity: 115.00 cm/s MV E/A ratio:  1.10 Aditya Sabharwal Electronically signed by Alwin Baars Signature Date/Time: 03/31/2024/11:44:25 AM    Final    Overnight EEG with video Result Date: 03/31/2024 Arleene Lack, MD     04/01/2024  6:40 AM Patient Name: LARYN VENNING MRN: [id number] Epilepsy Attending: Arleene Lack Referring Physician/Provider: Ronnette Coke, MD Duration: 03/30/2024 1759 to 03/31/2024 1759 Patient history: 67 y.o. female with a history of seizure who is undergoing an EEG to evaluate for seizures. Level of alertness:  comatose AEDs during EEG study: LEV, Perampanel , PHT, LCM, Phenobarb,VPA, Propofol  Technical aspects: This EEG study was done with scalp electrodes positioned according to the 10-20 International system of electrode placement. Electrical activity was reviewed with band pass filter of 1-70Hz , sensitivity of 7 uV/mm, display speed of 87mm/sec with a 60Hz  notched filter applied as appropriate. EEG data were recorded continuously and digitally stored.  Video monitoring was available and reviewed as appropriate. Description: At the beginning of the study, patient reportedly had near continuous twitching of right shoulder. 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 EEG was disconnected between 03/31/2024 0242 to 0609 as patient was moved to ICU. Patient was intubated and started on propofol . Subsequently, status epilepticus resolved and showed burst suppression  pattern with generalized bursts of 3-6hz  theta-delta slowing lasting 2-5 seconds alternating with generalized suppression lasting 3-5 seconds. Sharp waves were noted in left hemisphere, maximal left temporal region. Hyperventilation and photic stimulation were not performed.   ABNORMALITY - Focal convulsive status epilepticus, left centro-parietal region - Burst suppression, generalized IMPRESSION: At the beginning of the study, patient had near continuous twitching of right shoulder. 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 Patient was intubated and started on propofol . Subsequently, status epilepticus resolved and was suggestive of profound diffuse encephalopathy likely due to sedation. Arleene Lack   Portable Chest x-ray Result Date: 03/31/2024 CLINICAL DATA:  Status post intubation and central line placement. EXAM: PORTABLE CHEST 1 VIEW COMPARISON:  03/28/2024 FINDINGS: The endotracheal tube tip is at the level of the carina and is directed towards the right mainstem bronchus. Recommend withdrawal by approximately 2 cm. There has been interval placement of a left subclavian central venous catheter. The catheter follows the expected course of the left subclavian artery and terminates below the level of the aortic knob. No pneumothorax identified. Heart size is normal. Small bilateral pleural effusions with veil like opacification over the right lower lobe. Chronic coarsened  interstitial markings noted bilaterally. The visualized osseous structures appear intact. IMPRESSION: 1. Endotracheal tube tip is at the level of the carina and is directed towards the right mainstem bronchus. Recommend withdrawal by approximately 2 cm. 2. Left subclavian catheter follows the expected course of the left subclavian artery with tip terminating along the left side of the mediastinum, below the level of the aortic knob. Findings are concerning for intra arterial placement of the catheter. No pneumothorax identified. 3. Small bilateral pleural effusions. Critical Value/emergent results were called by telephone at the time of interpretation on 03/31/2024 at 6:04 am to provider Dr. Mason Sole, who verbally acknowledged these results. Electronically Signed   By: Kimberley Penman M.D.   On: 03/31/2024 06:04   MR BRAIN W WO CONTRAST Result Date: 03/30/2024 CLINICAL DATA:  Seizure, new onset. Seizure like activity, twitching of right face and shoulder. Concern for dural venous sinus thrombosis. History of antiphospholipid syndrome. EXAM: MRI HEAD WITH AND WITHOUT CONTRAST MRV HEAD WITH AND WITHOUT CONTRAST TECHNIQUE: Multiplanar, multi-echo pulse sequences of the brain and surrounding structures were acquired without intravenous contrast. Angiographic images of the intracranial venous structures were acquired using MRV technique without intravenous contrast. COMPARISON:  CT head 03/28/2024. FINDINGS: MRI HEAD WITHOUT CONTRAST Brain: Remote infarcts in the bilateral occipital lobes, right greater than left with associated surrounding gliosis. Few additional areas of cortical signal abnormality in the bilateral parietal lobes which may reflect areas of remote infarct. There are scattered areas of diffusion signal abnormality some of which demonstrate ADC hypointensity involving the cortex of the bilateral occipital lobes slightly more pronounced on the left. Additional scattered areas of diffusion signal abnormality  involving the bilateral parietal cortex. Additional restricted diffusion involving the left temporal occipital lobes. Redemonstrated subdural collection over the left cerebral convexity which measures approximately 3 mm in thickness similar to recent prior CT. No significant associated mass effect. No midline shift. T2/FLAIR hyperintensity in the periventricular and subcortical white matter. Additional remote infarcts in the bilateral corona radiata and right frontal subcortical white matter. Mild parenchymal volume loss. There are numerous scattered foci of susceptibility within the bilateral cerebral hemispheres with a few distal foci within the basal ganglia, brainstem, and cerebellum. Mild dural thickening and enhancement over the left cerebral convexity which may  be reactive in the setting of subdural hematoma. No abnormal parenchymal enhancement. Ventricles: Prominence of the lateral ventricles suggestive of underlying parenchymal volume loss. Vascular: Skull base flow voids are visualized. Skull and upper cervical spine: No focal abnormality. Sinuses/Orbits: Orbits are symmetric. Mucosal thickening in the right maxillary sinus. Other: Mastoid air cells are clear. MR VENOGRAM WITHOUT CONTRAST The superior sagittal sinus is patent. Visualized cortical veins along the convexity are unremarkable. The internal cerebral veins, vein of Galen, and straight sinus are patent. Normal appearance of the confluence of the sinuses. The left transverse and sigmoid sinuses are dominant. The left transverse and sigmoid sinus appears patent without stenosis or evidence of thrombosis. Right transverse and sigmoid sinus also appears patent without significant stenosis or evidence of thrombosis. IMPRESSION: Multiple scattered foci of infarct involving the cortex and subcortical white matter in the bilateral occipital lobes, parietal lobes, and left temporal occipital lobes. Infarcts likely of varying ages some of which appear  subacute. Redemonstrated subdural collection over the left cerebral convexity measuring 3 mm in thickness. Dural thickening and enhancement over the left cerebral convexities likely reactive. No evidence of venous sinus thrombosis.  No significant stenosis. Moderate chronic microvascular ischemic changes. Multiple remote infarcts as above. Electronically Signed   By: Denny Flack M.D.   On: 03/30/2024 16:34   MR MRV HEAD W WO CONTRAST Result Date: 03/30/2024 CLINICAL DATA:  Seizure, new onset. Seizure like activity, twitching of right face and shoulder. Concern for dural venous sinus thrombosis. History of antiphospholipid syndrome. EXAM: MRI HEAD WITH AND WITHOUT CONTRAST MRV HEAD WITH AND WITHOUT CONTRAST TECHNIQUE: Multiplanar, multi-echo pulse sequences of the brain and surrounding structures were acquired without intravenous contrast. Angiographic images of the intracranial venous structures were acquired using MRV technique without intravenous contrast. COMPARISON:  CT head 03/28/2024. FINDINGS: MRI HEAD WITHOUT CONTRAST Brain: Remote infarcts in the bilateral occipital lobes, right greater than left with associated surrounding gliosis. Few additional areas of cortical signal abnormality in the bilateral parietal lobes which may reflect areas of remote infarct. There are scattered areas of diffusion signal abnormality some of which demonstrate ADC hypointensity involving the cortex of the bilateral occipital lobes slightly more pronounced on the left. Additional scattered areas of diffusion signal abnormality involving the bilateral parietal cortex. Additional restricted diffusion involving the left temporal occipital lobes. Redemonstrated subdural collection over the left cerebral convexity which measures approximately 3 mm in thickness similar to recent prior CT. No significant associated mass effect. No midline shift. T2/FLAIR hyperintensity in the periventricular and subcortical white matter.  Additional remote infarcts in the bilateral corona radiata and right frontal subcortical white matter. Mild parenchymal volume loss. There are numerous scattered foci of susceptibility within the bilateral cerebral hemispheres with a few distal foci within the basal ganglia, brainstem, and cerebellum. Mild dural thickening and enhancement over the left cerebral convexity which may be reactive in the setting of subdural hematoma. No abnormal parenchymal enhancement. Ventricles: Prominence of the lateral ventricles suggestive of underlying parenchymal volume loss. Vascular: Skull base flow voids are visualized. Skull and upper cervical spine: No focal abnormality. Sinuses/Orbits: Orbits are symmetric. Mucosal thickening in the right maxillary sinus. Other: Mastoid air cells are clear. MR VENOGRAM WITHOUT CONTRAST The superior sagittal sinus is patent. Visualized cortical veins along the convexity are unremarkable. The internal cerebral veins, vein of Galen, and straight sinus are patent. Normal appearance of the confluence of the sinuses. The left transverse and sigmoid sinuses are dominant. The left transverse and sigmoid sinus appears  patent without stenosis or evidence of thrombosis. Right transverse and sigmoid sinus also appears patent without significant stenosis or evidence of thrombosis. IMPRESSION: Multiple scattered foci of infarct involving the cortex and subcortical white matter in the bilateral occipital lobes, parietal lobes, and left temporal occipital lobes. Infarcts likely of varying ages some of which appear subacute. Redemonstrated subdural collection over the left cerebral convexity measuring 3 mm in thickness. Dural thickening and enhancement over the left cerebral convexities likely reactive. No evidence of venous sinus thrombosis.  No significant stenosis. Moderate chronic microvascular ischemic changes. Multiple remote infarcts as above. Electronically Signed   By: Denny Flack M.D.   On:  03/30/2024 16:34   EEG adult Result Date: 03/29/2024 Eleni Griffin, MD     03/30/2024  7:48 PM Routine EEG Report DONABELLE MOLDEN is a 68 y.o. female with a history of seizure who is undergoing an EEG to evaluate for seizures. Report: This EEG was acquired with electrodes placed according to the International 10-20 electrode system (including Fp1, Fp2, F3, F4, C3, C4, P3, P4, O1, O2, T3, T4, T5, T6, A1, A2, Fz, Cz, Pz). The following electrodes were missing or displaced: none. The occipital dominant rhythm was 7 Hz with intermittent more pronounced slowing and frequent periods of very low amplitude activity (not complete suppression) lasting up to 3 seconds. Overriding beta frequencies are noted. This activity is reactive to stimulation. Drowsiness was manifested by background fragmentation; deeper stages of sleep were identified by K complexes and sleep spindles. There was no focal slowing. There were no interictal epileptiform discharges. There were no electrographic seizures identified. There is prominent artifact throughout the recording in both the EEG leads and EKG lead however this was not felt to limit ability to interpret recording. Impression and clinical correlation: This EEG was obtained while awake and asleep and is abnormal due to mild-to-moderate diffuse slowing indicative of global cerebral dysfunction. Epileptiform abnormalities were not seen during this recording. Greg Leaks, MD [facility] [phone] If 7pm- 7am, please page neurology on call as listed in [facility].   CT Head Wo Contrast Result Date: 03/28/2024 CLINICAL DATA:  Right side tremors EXAM: CT HEAD WITHOUT CONTRAST TECHNIQUE: Contiguous axial images were obtained from the base of the skull through the vertex without intravenous contrast. RADIATION DOSE REDUCTION: This exam was performed according to the departmental dose-optimization program which includes automated exposure control, adjustment of the mA and/or kV  according to patient size and/or use of iterative reconstruction technique. 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 Further decreased size of the previously seen mixed density left subdural hematoma, now 3 mm in thickness compared to 7 mm previously. No mass effect or midline shift. No new hemorrhage or infarction. There is atrophy and chronic small vessel disease changes. Vascular: No hyperdense vessel or unexpected calcification. Skull: No acute calvarial abnormality. Sinuses/Orbits: No acute findings Other: None IMPRESSION: Further decreased size of the previously seen mixed density left subdural hematoma, now 3 mm in thickness compared to 7 mm previously. 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 Atrophy, chronic microvascular disease. No acute intracranial abnormality. Electronically Signed   By: Janeece Mechanic M.D.   On: 03/28/2024 23:30   DG Chest Port 1 View Result Date: 03/28/2024 CLINICAL DATA:  Questionable sepsis EXAM: PORTABLE CHEST 1 VIEW COMPARISON:  Chest x-ray 02/13/2024 FINDINGS: The heart size and mediastinal contours are within normal limits. Both lungs are clear. The visualized skeletal structures are unremarkable. IMPRESSION: No active disease. Electronically Signed  By: Tyron Gallon M.D.   On: 03/28/2024 23:28    Microbiology Recent Results (from the past 240 hours)  Culture, Respiratory w Gram Stain     Status: None (Preliminary result)   Collection Time: 04/10/24 12:33 PM   Specimen: Tracheal Aspirate; Respiratory  Result Value Ref Range Status   Specimen Description TRACHEAL ASPIRATE  Final   Special Requests NONE  Final   Gram Stain   Final    MODERATE WBC PRESENT, PREDOMINANTLY PMN RARE SQUAMOUS EPITHELIAL CELLS PRESENT ABUNDANT GRAM NEGATIVE RODS FEW GRAM POSITIVE COCCI FEW YEAST    Culture   Final    ABUNDANT PSEUDOMONAS AERUGINOSA SUSCEPTIBILITIES TO FOLLOW Performed at [facility], [address], [place],  [place] [address]    Report Status PENDING  Incomplete    Lab Basic Metabolic Panel: Recent Labs  Lab 04/07/24 0815 04/10/24 1308  NA 135 141  K 4.7 4.5  CL 96*  --   CO2 33*  --   GLUCOSE 133*  --   BUN 26*  --   CREATININE 0.63  --   CALCIUM  8.7*  --    Liver Function Tests: No results for input(s): "AST", "ALT", "ALKPHOS", "BILITOT", "PROT", "ALBUMIN" in the last 168 hours. No results for input(s): "LIPASE", "AMYLASE" in the last 168 hours. No results for input(s): "AMMONIA" in the last 168 hours. CBC: Recent Labs  Lab 04/07/24 0815 04/10/24 1308  WBC 7.7  --   HGB 8.6* 7.5*  HCT 27.4* 22.0*  MCV 86.4  --   PLT 110*  --    Cardiac Enzymes: No results for input(s): "CKTOTAL", "CKMB", "CKMBINDEX", "TROPONINI" in the last 168 hours. Sepsis Labs: Recent Labs  Lab 04/07/24 0815  WBC 7.7    Procedures/Operations  Endotracheal Intubation Central Line Placement cEEG   Wilfredo Hanly 05/04/2024, 8:55 AM

## 2024-05-10 NOTE — Progress Notes (Signed)
 Nutrition Brief Note  Chart reviewed. Pt now transitioning to comfort care.  No further nutrition interventions planned at this time.  Please re-consult as needed.   Greig Castilla, RD, LDN Registered Dietitian II Please reach out via secure chat

## 2024-05-10 DEATH — deceased

## 2024-06-14 ENCOUNTER — Other Ambulatory Visit

## 2024-06-14 ENCOUNTER — Ambulatory Visit: Admitting: Oncology
